# Patient Record
Sex: Female | Born: 1935 | Race: Black or African American | Hispanic: No | Smoking: Never smoker
Health system: Southern US, Community
[De-identification: ages and names within clinical notes are randomized; demographics above are authoritative.]

## PROBLEM LIST (undated history)

## (undated) DIAGNOSIS — E119 Type 2 diabetes mellitus without complications: Secondary | ICD-10-CM

## (undated) DIAGNOSIS — T8859XA Other complications of anesthesia, initial encounter: Secondary | ICD-10-CM

## (undated) DIAGNOSIS — N189 Chronic kidney disease, unspecified: Secondary | ICD-10-CM

## (undated) NOTE — *Deleted (*Deleted)
Pt bladder scanned x3 due to low urine output,  7mL each time.

---

## 2020-03-02 ENCOUNTER — Inpatient Hospital Stay (HOSPITAL_COMMUNITY)
Admission: EM | Admit: 2020-03-02 | Discharge: 2020-03-30 | DRG: 498 | Disposition: A | Discharge status: Home Health | Payer: 59 | Attending: Internal Medicine | Admitting: Internal Medicine

## 2020-03-02 ENCOUNTER — Encounter (HOSPITAL_COMMUNITY): Payer: Self-pay

## 2020-03-02 ENCOUNTER — Other Ambulatory Visit: Payer: Self-pay

## 2020-03-02 ENCOUNTER — Emergency Department (HOSPITAL_COMMUNITY): Payer: 59

## 2020-03-02 DIAGNOSIS — S72001S Fracture of unspecified part of neck of right femur, sequela: Secondary | ICD-10-CM | POA: Diagnosis not present

## 2020-03-02 DIAGNOSIS — R339 Retention of urine, unspecified: Secondary | ICD-10-CM | POA: Diagnosis present

## 2020-03-02 DIAGNOSIS — R52 Pain, unspecified: Secondary | ICD-10-CM | POA: Diagnosis present

## 2020-03-02 DIAGNOSIS — Z515 Encounter for palliative care: Secondary | ICD-10-CM

## 2020-03-02 DIAGNOSIS — R112 Nausea with vomiting, unspecified: Secondary | ICD-10-CM

## 2020-03-02 DIAGNOSIS — Z7189 Other specified counseling: Secondary | ICD-10-CM

## 2020-03-02 DIAGNOSIS — Z79899 Other long term (current) drug therapy: Secondary | ICD-10-CM

## 2020-03-02 DIAGNOSIS — W19XXXD Unspecified fall, subsequent encounter: Secondary | ICD-10-CM | POA: Diagnosis present

## 2020-03-02 DIAGNOSIS — Z794 Long term (current) use of insulin: Secondary | ICD-10-CM

## 2020-03-02 DIAGNOSIS — E860 Dehydration: Secondary | ICD-10-CM

## 2020-03-02 DIAGNOSIS — Z96643 Presence of artificial hip joint, bilateral: Secondary | ICD-10-CM | POA: Diagnosis present

## 2020-03-02 DIAGNOSIS — Z96649 Presence of unspecified artificial hip joint: Secondary | ICD-10-CM

## 2020-03-02 DIAGNOSIS — R05 Cough: Secondary | ICD-10-CM

## 2020-03-02 DIAGNOSIS — I9581 Postprocedural hypotension: Secondary | ICD-10-CM | POA: Diagnosis not present

## 2020-03-02 DIAGNOSIS — R600 Localized edema: Secondary | ICD-10-CM | POA: Diagnosis present

## 2020-03-02 DIAGNOSIS — K8020 Calculus of gallbladder without cholecystitis without obstruction: Secondary | ICD-10-CM | POA: Diagnosis present

## 2020-03-02 DIAGNOSIS — Z20822 Contact with and (suspected) exposure to covid-19: Secondary | ICD-10-CM | POA: Diagnosis present

## 2020-03-02 DIAGNOSIS — K5900 Constipation, unspecified: Secondary | ICD-10-CM | POA: Diagnosis present

## 2020-03-02 DIAGNOSIS — N1831 Chronic kidney disease, stage 3a: Secondary | ICD-10-CM | POA: Diagnosis not present

## 2020-03-02 DIAGNOSIS — G9341 Metabolic encephalopathy: Secondary | ICD-10-CM | POA: Diagnosis not present

## 2020-03-02 DIAGNOSIS — I129 Hypertensive chronic kidney disease with stage 1 through stage 4 chronic kidney disease, or unspecified chronic kidney disease: Secondary | ICD-10-CM | POA: Diagnosis present

## 2020-03-02 DIAGNOSIS — N183 Chronic kidney disease, stage 3 unspecified: Secondary | ICD-10-CM | POA: Diagnosis present

## 2020-03-02 DIAGNOSIS — E669 Obesity, unspecified: Secondary | ICD-10-CM | POA: Diagnosis present

## 2020-03-02 DIAGNOSIS — N1832 Chronic kidney disease, stage 3b: Secondary | ICD-10-CM | POA: Diagnosis present

## 2020-03-02 DIAGNOSIS — Z7401 Bed confinement status: Secondary | ICD-10-CM

## 2020-03-02 DIAGNOSIS — S72001A Fracture of unspecified part of neck of right femur, initial encounter for closed fracture: Secondary | ICD-10-CM | POA: Diagnosis present

## 2020-03-02 DIAGNOSIS — IMO0002 Reserved for concepts with insufficient information to code with codable children: Secondary | ICD-10-CM

## 2020-03-02 DIAGNOSIS — E1122 Type 2 diabetes mellitus with diabetic chronic kidney disease: Secondary | ICD-10-CM | POA: Diagnosis present

## 2020-03-02 DIAGNOSIS — R059 Cough, unspecified: Secondary | ICD-10-CM

## 2020-03-02 DIAGNOSIS — Z419 Encounter for procedure for purposes other than remedying health state, unspecified: Secondary | ICD-10-CM

## 2020-03-02 DIAGNOSIS — E875 Hyperkalemia: Secondary | ICD-10-CM | POA: Diagnosis not present

## 2020-03-02 DIAGNOSIS — L97829 Non-pressure chronic ulcer of other part of left lower leg with unspecified severity: Secondary | ICD-10-CM | POA: Diagnosis present

## 2020-03-02 DIAGNOSIS — L97819 Non-pressure chronic ulcer of other part of right lower leg with unspecified severity: Secondary | ICD-10-CM | POA: Diagnosis present

## 2020-03-02 DIAGNOSIS — R319 Hematuria, unspecified: Secondary | ICD-10-CM | POA: Diagnosis not present

## 2020-03-02 DIAGNOSIS — Z66 Do not resuscitate: Secondary | ICD-10-CM | POA: Diagnosis present

## 2020-03-02 DIAGNOSIS — W19XXXA Unspecified fall, initial encounter: Secondary | ICD-10-CM | POA: Diagnosis present

## 2020-03-02 DIAGNOSIS — E11649 Type 2 diabetes mellitus with hypoglycemia without coma: Secondary | ICD-10-CM | POA: Diagnosis not present

## 2020-03-02 DIAGNOSIS — R109 Unspecified abdominal pain: Secondary | ICD-10-CM

## 2020-03-02 DIAGNOSIS — S72091K Other fracture of head and neck of right femur, subsequent encounter for closed fracture with nonunion: Principal | ICD-10-CM

## 2020-03-02 DIAGNOSIS — N3582 Other urethral stricture, female: Secondary | ICD-10-CM | POA: Diagnosis present

## 2020-03-02 DIAGNOSIS — E46 Unspecified protein-calorie malnutrition: Secondary | ICD-10-CM | POA: Diagnosis present

## 2020-03-02 DIAGNOSIS — E43 Unspecified severe protein-calorie malnutrition: Secondary | ICD-10-CM | POA: Diagnosis present

## 2020-03-02 DIAGNOSIS — D62 Acute posthemorrhagic anemia: Secondary | ICD-10-CM | POA: Diagnosis not present

## 2020-03-02 DIAGNOSIS — E119 Type 2 diabetes mellitus without complications: Secondary | ICD-10-CM

## 2020-03-02 DIAGNOSIS — I080 Rheumatic disorders of both mitral and aortic valves: Secondary | ICD-10-CM | POA: Diagnosis present

## 2020-03-02 DIAGNOSIS — Z6832 Body mass index (BMI) 32.0-32.9, adult: Secondary | ICD-10-CM

## 2020-03-02 DIAGNOSIS — R197 Diarrhea, unspecified: Secondary | ICD-10-CM | POA: Diagnosis not present

## 2020-03-02 DIAGNOSIS — M80861A Other osteoporosis with current pathological fracture, right lower leg, initial encounter for fracture: Secondary | ICD-10-CM | POA: Diagnosis not present

## 2020-03-02 HISTORY — DX: Type 2 diabetes mellitus without complications: E11.9

## 2020-03-02 LAB — COMPREHENSIVE METABOLIC PANEL
ALT: 18 U/L (ref 0–44)
AST: 27 U/L (ref 15–41)
Albumin: 1.8 g/dL — ABNORMAL LOW (ref 3.5–5.0)
Alkaline Phosphatase: 321 U/L — ABNORMAL HIGH (ref 38–126)
Anion gap: 9 (ref 5–15)
BUN: 32 mg/dL — ABNORMAL HIGH (ref 8–23)
CO2: 17 mmol/L — ABNORMAL LOW (ref 22–32)
Calcium: 7.6 mg/dL — ABNORMAL LOW (ref 8.9–10.3)
Chloride: 111 mmol/L (ref 98–111)
Creatinine, Ser: 1.4 mg/dL — ABNORMAL HIGH (ref 0.44–1.00)
GFR calc Af Amer: 40 mL/min — ABNORMAL LOW (ref >60)
GFR calc non Af Amer: 35 mL/min — ABNORMAL LOW (ref >60)
Glucose, Bld: 176 mg/dL — ABNORMAL HIGH (ref 70–99)
Potassium: 4.3 mmol/L (ref 3.5–5.1)
Sodium: 137 mmol/L (ref 135–145)
Total Bilirubin: 0.8 mg/dL (ref 0.3–1.2)
Total Protein: 6 g/dL — ABNORMAL LOW (ref 6.5–8.1)

## 2020-03-02 LAB — CBC WITH DIFFERENTIAL/PLATELET
Abs Immature Granulocytes: 0.02 10*3/uL (ref 0.00–0.07)
Basophils Absolute: 0 10*3/uL (ref 0.0–0.1)
Basophils Relative: 0 %
Eosinophils Absolute: 0.1 10*3/uL (ref 0.0–0.5)
Eosinophils Relative: 1 %
HCT: 36.5 % (ref 36.0–46.0)
Hemoglobin: 11 g/dL — ABNORMAL LOW (ref 12.0–15.0)
Immature Granulocytes: 0 %
Lymphocytes Relative: 17 %
Lymphs Abs: 1 10*3/uL (ref 0.7–4.0)
MCH: 30.1 pg (ref 26.0–34.0)
MCHC: 30.1 g/dL (ref 30.0–36.0)
MCV: 99.7 fL (ref 80.0–100.0)
Monocytes Absolute: 0.9 10*3/uL (ref 0.1–1.0)
Monocytes Relative: 15 %
Neutro Abs: 4.1 10*3/uL (ref 1.7–7.7)
Neutrophils Relative %: 67 %
Platelets: 201 10*3/uL (ref 150–400)
RBC: 3.66 MIL/uL — ABNORMAL LOW (ref 3.87–5.11)
RDW: 15.3 % (ref 11.5–15.5)
WBC: 6.2 10*3/uL (ref 4.0–10.5)
nRBC: 0 % (ref 0.0–0.2)

## 2020-03-02 LAB — PROTIME-INR
INR: 1.2 (ref 0.8–1.2)
Prothrombin Time: 15.2 seconds (ref 11.4–15.2)

## 2020-03-02 MED ORDER — FENTANYL CITRATE (PF) 100 MCG/2ML IJ SOLN
50.0000 ug | INTRAMUSCULAR | Status: DC | PRN
  Administered 2020-03-02 (×2): 50 ug via INTRAVENOUS
  Filled 2020-03-02 (×2): qty 2

## 2020-03-02 MED ORDER — SODIUM CHLORIDE 0.9 % IV SOLN: INTRAVENOUS | Status: DC

## 2020-03-02 MED ORDER — ONDANSETRON HCL 4 MG/2ML IJ SOLN
4.0000 mg | Freq: Once | INTRAMUSCULAR | Status: AC
  Administered 2020-03-02: 4 mg via INTRAVENOUS
  Filled 2020-03-02: qty 2

## 2020-03-02 NOTE — ED Triage Notes (Signed)
Pt arrives POV with daughter who reports pt fell with right hip fracture 35 days ago while living in [place]. Daughter flew her to [place] and took her to a hospital where she stayed a few days and they made her leave due to insurance issues. Pt placed on stretcher in triage. Pt a.o.

## 2020-03-02 NOTE — Care Management (Signed)
ED CM consulted concerning patient who presented to the [facility] ED s/p hip injury in [place]. Patient has returned to [facility] and is a [place] resident with [employer] coverage. Daughter is concerned about when will surgery be done, due to increase pain and limited mobility.  CM explained that patient is still being evaluated by the EDP.  She verbalized understanding daughter remains at bedside on [place] [facility]. TOC will continue to follow for transitional care planning

## 2020-03-02 NOTE — ED Provider Notes (Signed)
[place] EMERGENCY DEPARTMENT Provider Note   CSN: [id number] Arrival date & time: 03/02/20  1810     History Chief Complaint  Patient presents with  . Hip Injury    Whitney [name] is a 85 y.o. female.  HPI She presents for evaluation of known right hip fracture.  She was seen each hip, at the time of the injury.  Her daughter gives history, and states that the patient was in bed, rolled over and felt a pop in her right hip.  She was hospitalized in the hospital in her hometown, in [place].  She left after a few days because of a bad experience with a prior hip fracture on the left.  She was brought to the [place], by air, arriving 1 week ago in [place].  She was immediately taken to a hospital, in [place], [place].  She was hospitalized for 2 days then discharged to follow-up with an orthopedist as an outpatient.  She was discharged, 02/27/2020.  Discharge medications, Tylenol, Lovenox, gabapentin, insulin glargine and lispro, oxycodone, Peri-Colace.  Note: Prior treatment at [facility], available in [place].    Past Medical History:  Diagnosis Date  . Diabetes mellitus without complication [facility])     Patient Active Problem List   Diagnosis Date Noted  . Intractable pain 03/02/2020    History reviewed. No pertinent surgical history.   OB History   No obstetric history on file.     No family history on file.  Social History   Tobacco Use  . Smoking status: Not on file  Substance Use Topics  . Alcohol use: Not on file  . Drug use: Not on file    Home Medications Prior to Admission medications   Not on File    Allergies    Patient has no allergy information on record.  Review of Systems   Review of Systems  All other systems reviewed and are negative.   Physical Exam Updated Vital Signs BP 131/65   Pulse 89   Temp 97.9 F (36.6 C) (Oral)   Resp 16   SpO2 97%   Physical Exam Vitals and nursing note  reviewed.  Constitutional:      Appearance: She is well-developed.  HENT:     Head: Normocephalic and atraumatic.  Eyes:     Conjunctiva/sclera: Conjunctivae normal.     Pupils: Pupils are equal, round, and reactive to light.  Neck:     Trachea: Phonation normal.  Cardiovascular:     Rate and Rhythm: Normal rate and regular rhythm.  Pulmonary:     Effort: Pulmonary effort is normal.     Breath sounds: Normal breath sounds.  Chest:     Chest wall: No tenderness.  Abdominal:     General: There is no distension.     Palpations: Abdomen is soft.     Tenderness: There is no abdominal tenderness. There is no guarding.  Musculoskeletal:        General: Normal range of motion.     Cervical back: Normal range of motion and neck supple.     Comments: Right leg shortened, right hip internally rotated, and tender  Skin:    General: Skin is warm and dry.     Comments: Scattered superficial pressure sores of the bilateral lower legs.  No associated bleeding, drainage or fluctuance.  Neurological:     Mental Status: She is alert and oriented to person, place, and time.     Motor:  No abnormal muscle tone.  Psychiatric:        Mood and Affect: Mood normal.        Behavior: Behavior normal.     ED Results / Procedures / Treatments   Labs (all labs ordered are listed, but only abnormal results are displayed) Labs Reviewed  COMPREHENSIVE METABOLIC PANEL - Abnormal; Notable for the following components:      Result Value   CO2 17 (*)    Glucose, Bld 176 (*)    BUN 32 (*)    Creatinine, Ser 1.40 (*)    Calcium 7.6 (*)    Total Protein 6.0 (*)    Albumin 1.8 (*)    Alkaline Phosphatase 321 (*)    GFR calc non Af Amer 35 (*)    GFR calc Af Amer 40 (*)    All other components within normal limits  CBC WITH DIFFERENTIAL/PLATELET - Abnormal; Notable for the following components:   RBC 3.66 (*)    Hemoglobin 11.0 (*)    All other components within normal limits  RESPIRATORY PANEL BY RT  PCR (FLU A&B, COVID)  PROTIME-INR  URINALYSIS, ROUTINE W REFLEX MICROSCOPIC    EKG EKG Interpretation  Date/Time:  Thursday March 02 2020 21:48:21 EDT Ventricular Rate:  79 PR Interval:    QRS Duration: 139 QT Interval:  445 QTC Calculation: 511 R Axis:   -44 Text Interpretation: Sinus rhythm 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 FINDINGS: There is a right femoral neck fracture with varus angulation. No subluxation or dislocation. Severe diffuse osteopenia. Prior left hip replacement. IMPRESSION: Right femoral neck fracture with varus angulation. Severe osteopenia. Electronically Signed   By: Rolm Baptise M.D.   On: 03/02/2020 19:45    Procedures Procedures (including critical care time)  Medications Ordered in ED Medications  fentaNYL (SUBLIMAZE) injection 50 mcg (50 mcg Intravenous Given 03/02/20 2139)  0.9 %  sodium chloride infusion ( Intravenous New Bag/Given 03/02/20 2142)  ondansetron (ZOFRAN) injection 4 mg (4 mg Intravenous Given 03/02/20 2139)    ED Course  I have reviewed the triage vital signs and the nursing notes.  Pertinent labs & imaging results that were available during my care of the patient were reviewed by me and considered in my medical decision making (see chart for details).  Clinical Course as of Mar 02 2322  Thu Mar 02, 2020  2117 Chronic appearing right hip fracture, interpreted by me  DG Hip Unilat  With Pelvis 2-3 Views Right [EW]  2118 I discussed case with Dr. Griffin Basil, who reviewed the patient's films.  He states that he does not have anyone who can flex the hip tomorrow, and suggest that the patient be discharged to follow-up with an orthopedist in his office, who does hip  replacements, next week.   [EW]  2221 Normal except CO2 low, glucose high, BUN high, creatinine high, calcium low, total protein low, albumin low, alk phos stays high, GFR low  Comprehensive metabolic panel(!) [EW]  5409 Normal except hemoglobin low  CBC with Differential(!) [EW]    Clinical Course User Index [EW] Daleen Bo, MD   MDM Rules/Calculators/A&P  Patient Vitals for the past 24 hrs:  BP Temp Temp src Pulse Resp SpO2  03/02/20 2130 131/65 -- -- 89 16 97 %  03/02/20 2100 (!) 135/59 -- -- 88 16 97 %  03/02/20 2050 (!) 148/66 -- -- 97 20 96 %  03/02/20 2024 -- -- -- 95 (!) 21 97 %  03/02/20 1818 (!) 134/47 97.9 F (36.6 C) Oral 94 16 94 %    11:23 PM Reevaluation with update and discussion. After initial assessment and treatment, an updated evaluation reveals she is more comfortable after pain medicine.  Findings discussed and questions answered. Daleen Bo   Medical Decision Making:  This patient is presenting for evaluation of subacute to chronic right hip fracture, not yet treated with additional interim problems including constipation, and not taking medications, which does require a range of treatment options, and is a complaint that involves a moderate risk of morbidity and mortality. The differential diagnoses include unstable fracture right hip, chronic right hip fracture, metabolic instability, constipation, occult infection,. I decided  to review old records, and in summary obtained through care everywhere, history as described by daughter above. I obtained additional historical information from daughter, at bedside. Clinical Laboratory Tests Ordered, included CBC, c-Met, urinalysis. Radiologic Tests Ordered, included right hip x-ray. I independently Visualized: Radiographic images, which show subacute to chronic right hip fracture; Cardiac Monitor Tracing which shows normal sinus rhythm   Critical Interventions-radiologic imaging,  laboratory evaluation, pain control, observation  After These Interventions, the Patient was reevaluated and was found more comfortable after pain medicine, laboratory indicators consistent with dehydration and hyperglycemia.  CRITICAL CARE-no Performed by: Daleen Bo   Nursing Notes Reviewed/ Care Coordinated Applicable Imaging Reviewed Interpretation of Laboratory Data incorporated into ED treatment   11:11 PM-Consult complete with hospitalist. Patient case explained and discussed.  He agrees to admit patient for further evaluation and treatment. Call ended at 11:21 PM  Plan: Admit   Final Clinical Impression(s) / ED Diagnoses Final diagnoses:  Closed fracture of right hip, initial encounter [facility])  Dehydration    Rx / DC Orders ED Discharge Orders    None       Daleen Bo, MD 03/02/20 2323

## 2020-03-02 NOTE — ED Notes (Signed)
UA sent down with culture

## 2020-03-03 ENCOUNTER — Observation Stay (HOSPITAL_COMMUNITY): Payer: 59

## 2020-03-03 ENCOUNTER — Inpatient Hospital Stay (HOSPITAL_COMMUNITY): Payer: 59

## 2020-03-03 ENCOUNTER — Encounter (HOSPITAL_COMMUNITY): Payer: Self-pay | Admitting: Family Medicine

## 2020-03-03 DIAGNOSIS — L97829 Non-pressure chronic ulcer of other part of left lower leg with unspecified severity: Secondary | ICD-10-CM | POA: Diagnosis present

## 2020-03-03 DIAGNOSIS — L97819 Non-pressure chronic ulcer of other part of right lower leg with unspecified severity: Secondary | ICD-10-CM | POA: Diagnosis present

## 2020-03-03 DIAGNOSIS — IMO0002 Reserved for concepts with insufficient information to code with codable children: Secondary | ICD-10-CM

## 2020-03-03 DIAGNOSIS — E43 Unspecified severe protein-calorie malnutrition: Secondary | ICD-10-CM | POA: Diagnosis present

## 2020-03-03 DIAGNOSIS — S72001A Fracture of unspecified part of neck of right femur, initial encounter for closed fracture: Secondary | ICD-10-CM | POA: Diagnosis not present

## 2020-03-03 DIAGNOSIS — S72001S Fracture of unspecified part of neck of right femur, sequela: Secondary | ICD-10-CM

## 2020-03-03 DIAGNOSIS — R52 Pain, unspecified: Secondary | ICD-10-CM | POA: Diagnosis not present

## 2020-03-03 DIAGNOSIS — E46 Unspecified protein-calorie malnutrition: Secondary | ICD-10-CM

## 2020-03-03 DIAGNOSIS — N1832 Chronic kidney disease, stage 3b: Secondary | ICD-10-CM | POA: Diagnosis present

## 2020-03-03 DIAGNOSIS — E11649 Type 2 diabetes mellitus with hypoglycemia without coma: Secondary | ICD-10-CM | POA: Diagnosis not present

## 2020-03-03 DIAGNOSIS — R197 Diarrhea, unspecified: Secondary | ICD-10-CM | POA: Diagnosis not present

## 2020-03-03 DIAGNOSIS — E119 Type 2 diabetes mellitus without complications: Secondary | ICD-10-CM | POA: Diagnosis not present

## 2020-03-03 DIAGNOSIS — Z515 Encounter for palliative care: Secondary | ICD-10-CM | POA: Diagnosis not present

## 2020-03-03 DIAGNOSIS — Z794 Long term (current) use of insulin: Secondary | ICD-10-CM | POA: Diagnosis not present

## 2020-03-03 DIAGNOSIS — R600 Localized edema: Secondary | ICD-10-CM | POA: Diagnosis present

## 2020-03-03 DIAGNOSIS — I129 Hypertensive chronic kidney disease with stage 1 through stage 4 chronic kidney disease, or unspecified chronic kidney disease: Secondary | ICD-10-CM | POA: Diagnosis present

## 2020-03-03 DIAGNOSIS — E860 Dehydration: Secondary | ICD-10-CM | POA: Diagnosis present

## 2020-03-03 DIAGNOSIS — S72091K Other fracture of head and neck of right femur, subsequent encounter for closed fracture with nonunion: Secondary | ICD-10-CM | POA: Diagnosis present

## 2020-03-03 DIAGNOSIS — W19XXXD Unspecified fall, subsequent encounter: Secondary | ICD-10-CM | POA: Diagnosis present

## 2020-03-03 DIAGNOSIS — Z7189 Other specified counseling: Secondary | ICD-10-CM | POA: Diagnosis not present

## 2020-03-03 DIAGNOSIS — I080 Rheumatic disorders of both mitral and aortic valves: Secondary | ICD-10-CM | POA: Diagnosis present

## 2020-03-03 DIAGNOSIS — E1122 Type 2 diabetes mellitus with diabetic chronic kidney disease: Secondary | ICD-10-CM | POA: Diagnosis present

## 2020-03-03 DIAGNOSIS — I340 Nonrheumatic mitral (valve) insufficiency: Secondary | ICD-10-CM | POA: Diagnosis not present

## 2020-03-03 DIAGNOSIS — N1831 Chronic kidney disease, stage 3a: Secondary | ICD-10-CM

## 2020-03-03 DIAGNOSIS — I9581 Postprocedural hypotension: Secondary | ICD-10-CM | POA: Diagnosis not present

## 2020-03-03 DIAGNOSIS — Z20822 Contact with and (suspected) exposure to covid-19: Secondary | ICD-10-CM | POA: Diagnosis present

## 2020-03-03 DIAGNOSIS — G9341 Metabolic encephalopathy: Secondary | ICD-10-CM | POA: Diagnosis not present

## 2020-03-03 DIAGNOSIS — N183 Chronic kidney disease, stage 3 unspecified: Secondary | ICD-10-CM | POA: Diagnosis present

## 2020-03-03 DIAGNOSIS — N3582 Other urethral stricture, female: Secondary | ICD-10-CM | POA: Diagnosis present

## 2020-03-03 DIAGNOSIS — E875 Hyperkalemia: Secondary | ICD-10-CM | POA: Diagnosis not present

## 2020-03-03 DIAGNOSIS — R319 Hematuria, unspecified: Secondary | ICD-10-CM | POA: Diagnosis not present

## 2020-03-03 DIAGNOSIS — W19XXXA Unspecified fall, initial encounter: Secondary | ICD-10-CM | POA: Diagnosis present

## 2020-03-03 DIAGNOSIS — Z66 Do not resuscitate: Secondary | ICD-10-CM | POA: Diagnosis present

## 2020-03-03 DIAGNOSIS — Z7401 Bed confinement status: Secondary | ICD-10-CM | POA: Diagnosis not present

## 2020-03-03 DIAGNOSIS — M80861A Other osteoporosis with current pathological fracture, right lower leg, initial encounter for fracture: Secondary | ICD-10-CM | POA: Diagnosis not present

## 2020-03-03 DIAGNOSIS — D62 Acute posthemorrhagic anemia: Secondary | ICD-10-CM | POA: Diagnosis not present

## 2020-03-03 LAB — URINALYSIS, ROUTINE W REFLEX MICROSCOPIC
Bilirubin Urine: NEGATIVE
Glucose, UA: NEGATIVE mg/dL
Hgb urine dipstick: NEGATIVE
Ketones, ur: NEGATIVE mg/dL
Nitrite: NEGATIVE
Protein, ur: NEGATIVE mg/dL
Specific Gravity, Urine: 1.02 (ref 1.005–1.030)
pH: 5 (ref 5.0–8.0)

## 2020-03-03 LAB — COMPREHENSIVE METABOLIC PANEL
ALT: 16 U/L (ref 0–44)
AST: 27 U/L (ref 15–41)
Albumin: 1.6 g/dL — ABNORMAL LOW (ref 3.5–5.0)
Alkaline Phosphatase: 282 U/L — ABNORMAL HIGH (ref 38–126)
Anion gap: 7 (ref 5–15)
BUN: 31 mg/dL — ABNORMAL HIGH (ref 8–23)
CO2: 19 mmol/L — ABNORMAL LOW (ref 22–32)
Calcium: 7.7 mg/dL — ABNORMAL LOW (ref 8.9–10.3)
Chloride: 117 mmol/L — ABNORMAL HIGH (ref 98–111)
Creatinine, Ser: 1.3 mg/dL — ABNORMAL HIGH (ref 0.44–1.00)
GFR calc Af Amer: 44 mL/min — ABNORMAL LOW (ref >60)
GFR calc non Af Amer: 38 mL/min — ABNORMAL LOW (ref >60)
Glucose, Bld: 147 mg/dL — ABNORMAL HIGH (ref 70–99)
Potassium: 4 mmol/L (ref 3.5–5.1)
Sodium: 143 mmol/L (ref 135–145)
Total Bilirubin: 0.4 mg/dL (ref 0.3–1.2)
Total Protein: 5.4 g/dL — ABNORMAL LOW (ref 6.5–8.1)

## 2020-03-03 LAB — GLUCOSE, CAPILLARY
Glucose-Capillary: 149 mg/dL — ABNORMAL HIGH (ref 70–99)
Glucose-Capillary: 99 mg/dL (ref 70–99)
Glucose-Capillary: 152 mg/dL — ABNORMAL HIGH (ref 70–99)
Glucose-Capillary: 109 mg/dL — ABNORMAL HIGH (ref 70–99)
Glucose-Capillary: 128 mg/dL — ABNORMAL HIGH (ref 70–99)

## 2020-03-03 LAB — CBC
HCT: 33.7 % — ABNORMAL LOW (ref 36.0–46.0)
Hemoglobin: 10.3 g/dL — ABNORMAL LOW (ref 12.0–15.0)
MCH: 30.2 pg (ref 26.0–34.0)
MCHC: 30.6 g/dL (ref 30.0–36.0)
MCV: 98.8 fL (ref 80.0–100.0)
Platelets: 183 10*3/uL (ref 150–400)
RBC: 3.41 MIL/uL — ABNORMAL LOW (ref 3.87–5.11)
RDW: 15.5 % (ref 11.5–15.5)
WBC: 5.9 10*3/uL (ref 4.0–10.5)
nRBC: 0 % (ref 0.0–0.2)

## 2020-03-03 LAB — RESPIRATORY PANEL BY RT PCR (FLU A&B, COVID)
Influenza A by PCR: NEGATIVE
Influenza B by PCR: NEGATIVE
SARS Coronavirus 2 by RT PCR: NEGATIVE

## 2020-03-03 LAB — ECHOCARDIOGRAM COMPLETE
Height: 62 in
Weight: 2811.31 oz

## 2020-03-03 LAB — BRAIN NATRIURETIC PEPTIDE: B Natriuretic Peptide: 74.9 pg/mL (ref 0.0–100.0)

## 2020-03-03 MED ORDER — INSULIN ASPART 100 UNIT/ML ~~LOC~~ SOLN
0.0000 [IU] | Freq: Every day | SUBCUTANEOUS | Status: DC
  Administered 2020-03-05: 3 [IU] via SUBCUTANEOUS

## 2020-03-03 MED ORDER — INSULIN ASPART 100 UNIT/ML ~~LOC~~ SOLN
0.0000 [IU] | Freq: Three times a day (TID) | SUBCUTANEOUS | Status: DC
  Administered 2020-03-03: 2 [IU] via SUBCUTANEOUS
  Administered 2020-03-04: 1 [IU] via SUBCUTANEOUS
  Administered 2020-03-04: 2 [IU] via SUBCUTANEOUS
  Administered 2020-03-05: 5 [IU] via SUBCUTANEOUS
  Administered 2020-03-05 – 2020-03-06 (×2): 3 [IU] via SUBCUTANEOUS
  Administered 2020-03-06: 2 [IU] via SUBCUTANEOUS
  Administered 2020-03-06: 1 [IU] via SUBCUTANEOUS
  Administered 2020-03-07: 2 [IU] via SUBCUTANEOUS
  Administered 2020-03-08 (×2): 1 [IU] via SUBCUTANEOUS
  Administered 2020-03-09 – 2020-03-10 (×3): 2 [IU] via SUBCUTANEOUS
  Administered 2020-03-11 – 2020-03-15 (×4): 1 [IU] via SUBCUTANEOUS
  Administered 2020-03-17 (×3): 2 [IU] via SUBCUTANEOUS
  Administered 2020-03-18: 1 [IU] via SUBCUTANEOUS
  Administered 2020-03-18: 2 [IU] via SUBCUTANEOUS
  Administered 2020-03-19 – 2020-03-20 (×3): 1 [IU] via SUBCUTANEOUS
  Administered 2020-03-20: 10:00:00 2 [IU] via SUBCUTANEOUS
  Administered 2020-03-21: 1 [IU] via SUBCUTANEOUS
  Administered 2020-03-21: 2 [IU] via SUBCUTANEOUS
  Administered 2020-03-23 – 2020-03-25 (×5): 1 [IU] via SUBCUTANEOUS
  Administered 2020-03-25 – 2020-03-27 (×2): 2 [IU] via SUBCUTANEOUS
  Administered 2020-03-28 – 2020-03-30 (×5): 1 [IU] via SUBCUTANEOUS

## 2020-03-03 MED ORDER — ENSURE ENLIVE PO LIQD
237.0000 mL | Freq: Two times a day (BID) | ORAL | Status: DC
  Administered 2020-03-03 – 2020-03-07 (×7): 237 mL via ORAL

## 2020-03-03 MED ORDER — GABAPENTIN 300 MG PO CAPS
300.0000 mg | ORAL_CAPSULE | Freq: Three times a day (TID) | ORAL | Status: DC
  Administered 2020-03-03 – 2020-03-09 (×17): 300 mg via ORAL
  Filled 2020-03-03 (×22): qty 1

## 2020-03-03 MED ORDER — SODIUM CHLORIDE 0.9% FLUSH: 3.0000 mL | INTRAVENOUS | Status: DC | PRN

## 2020-03-03 MED ORDER — INSULIN GLARGINE 100 UNIT/ML ~~LOC~~ SOLN
10.0000 [IU] | Freq: Every day | SUBCUTANEOUS | Status: DC
  Administered 2020-03-03 – 2020-03-13 (×11): 10 [IU] via SUBCUTANEOUS
  Filled 2020-03-03 (×13): qty 0.1

## 2020-03-03 MED ORDER — SODIUM CHLORIDE 0.9% FLUSH
3.0000 mL | Freq: Two times a day (BID) | INTRAVENOUS | Status: DC
  Administered 2020-03-03 – 2020-03-30 (×46): 3 mL via INTRAVENOUS

## 2020-03-03 MED ORDER — ONDANSETRON HCL 4 MG/2ML IJ SOLN
4.0000 mg | Freq: Four times a day (QID) | INTRAMUSCULAR | Status: DC | PRN
  Administered 2020-03-05: 4 mg via INTRAVENOUS
  Filled 2020-03-03: qty 2

## 2020-03-03 MED ORDER — HEPARIN SODIUM (PORCINE) 5000 UNIT/ML IJ SOLN
5000.0000 [IU] | Freq: Three times a day (TID) | INTRAMUSCULAR | Status: DC
  Administered 2020-03-03 – 2020-03-04 (×4): 5000 [IU] via SUBCUTANEOUS
  Filled 2020-03-03 (×4): qty 1

## 2020-03-03 MED ORDER — ADULT MULTIVITAMIN W/MINERALS CH
1.0000 | ORAL_TABLET | Freq: Every day | ORAL | Status: DC
  Administered 2020-03-03 – 2020-03-30 (×20): 1 via ORAL
  Filled 2020-03-03 (×30): qty 1

## 2020-03-03 MED ORDER — ACETAMINOPHEN 325 MG PO TABS
650.0000 mg | ORAL_TABLET | Freq: Four times a day (QID) | ORAL | Status: DC | PRN
  Administered 2020-03-04 – 2020-03-22 (×3): 650 mg via ORAL
  Filled 2020-03-03 (×9): qty 2

## 2020-03-03 MED ORDER — SODIUM CHLORIDE 0.9 % IV SOLN: 250.0000 mL | INTRAVENOUS | Status: DC | PRN

## 2020-03-03 MED ORDER — MORPHINE SULFATE (PF) 2 MG/ML IV SOLN
1.0000 mg | INTRAVENOUS | Status: DC | PRN
  Administered 2020-03-05 (×2): 2 mg via INTRAVENOUS
  Administered 2020-03-05: 1 mg via INTRAVENOUS
  Filled 2020-03-03 (×3): qty 1

## 2020-03-03 MED ORDER — SENNOSIDES-DOCUSATE SODIUM 8.6-50 MG PO TABS: 1.0000 | ORAL_TABLET | Freq: Every evening | ORAL | Status: DC | PRN

## 2020-03-03 NOTE — Progress Notes (Signed)
PROGRESS NOTE  Whitney [name] DGU:[id number] DOB: [birth year] DOA: 03/02/2020 PCP: Patient, No Pcp Per   LOS: 0 days   Brief narrative: As per HPI, Whitney [name] is a 85 y.o. female with medical history significant for insulin-dependent diabetes mellitus, chronic kidney disease stage III, and right hip fracture more than a month ago, now presenting to the emergency department with increasing right hip pain despite taking oxycodone and gabapentin at home.  Patient is accompanied by her daughter who assists with the history.  Patient previously lived in [place] but had since been in [place] where she suffered a right hip fracture more than a month ago.  She was seen in a hospital in [place] where she was treated with some pain medications and Lovenox for 4 flying to the [place].  She was briefly admitted to a hospital in [place] several days ago where she was seen by orthopedic surgery and discharged home with plans for outpatient follow-up.  She has since come to [place], continues to use oxycodone and gabapentin, but has been experiencing increasing pain in the right hip.  She denied any fevers, chills, chest pain, cough, or shortness of breath.  ED Course: Upon arrival to the ED, patient is found to be afebrile, saturating mid 90s on room air, and with stable blood pressure.  EKG features a sinus rhythm.  Radiographs of the right hip demonstrate right femoral neck fracture with varus angulation.  Chemistry panel notable for albumin 1.8 and creatinine 1.40, similar to priors.  Orthopedic surgery was consulted by the ED physician but did not feel that the patient requires inpatient surgical management.  She was given IV fentanyl in the ED but continues to complain of severe pain and hospitalists were asked to admit.   Assessment/Plan:  Principal Problem:   Intractable pain Active Problems:   Diabetes mellitus without complication (HCC)   CKD (chronic kidney disease), stage III  Closed right hip fracture (HCC)   Protein calorie malnutrition (HCC)   Bilateral leg edema   Right hip fracture with intractable pain  Patient suffered right hip fracture >1 month ago. Unable to walk since 2011 and was on wheelchair.  Now with increasing pain. She had been taking oxycodone and gabapentin at home.  Radiographs with right femoral neck fracture with varus angulation .Orthopedic surgery was consulted.   She might benefit from palliative hip surgery since she has been pretty much bedbound since 2011.Marland Kitchen  Continue IV analgesia, continue gabapentin, transition to oral medications as she tolerates .  Will hold off with PT evaluation at this time.   CKD IIIb - SCr today1.3, consistent with her apparent baseline. Renally-dose medications, monitor    Type II DM , insulin-dependent Hemoglobin A1c was 5.4% in April 2021. Continue CBGs, long- and short-acting insulin . On long acting and short acting insulin at home.  Bilateral lower Edema  Does not have history of congestive heart failure.  No dyspnea reported.  Likely secondary to stasis with recent hip fracture.  Patient is mostly bedbound on a wheelchair.  She does have blisters and superficial ulcers likely from pressure ulceration with bilateral pitting LE edema with bullae and serous weeping .  Very low albumin at 1.8.  Consult dietary.  Elevate legs.  Check BNP and 2D echocardiogram.   VTE Prophylaxis: Heparin subcu  Code Status: Full code  Family Communication: Spoke with the patient's daughter on the phone and updated her about the clinical condition of the patient.  Informed her that palliative  surgery might be necessary.  Status is: Observation  The patient will require care spanning > 2 midnights and should be moved to inpatient because: Ongoing active pain requiring inpatient pain management, Ongoing diagnostic testing needed not appropriate for outpatient work up, Unsafe d/c plan, Inpatient level of care appropriate due  to severity of illness and Need for orthopedic surgery evaluation and potential surgery.  Dispo: The patient is from: Home              Anticipated d/c is to: SNF              Anticipated d/c date is: 3 days              Patient currently is not medically stable to d/c.  Consultants: Orthopedics  Procedures:  None  Antibiotics:  . None  Anti-infectives (From admission, onward)   None      Subjective: Today, patient was seen and examined at bedside.  Speaks Arabic so limited history was obtained.  Complains of mild hip pain.  Objective: Vitals:   03/03/20 0742 03/03/20 0838  BP: 125/73 125/73  Pulse: 72 72  Resp: 16 16  Temp: (!) 97.5 F (36.4 C) (!) 97.5 F (36.4 C)  SpO2: 100%    No intake or output data in the 24 hours ending 03/03/20 1121 Filed Weights   03/03/20 0421 03/03/20 0838  Weight: 79.7 kg 79.7 kg   Body mass index is 32.14 kg/m.   Physical Exam:  GENERAL: Patient is alert awake and communicative not in obvious distress.  Obese HENT: No scleral pallor or icterus. Pupils equally reactive to light. Oral mucosa is moist NECK: is supple, no gross swelling noted. CHEST: Clear to auscultation. No crackles or wheezes.  Diminished breath sounds bilaterally. CVS: S1 and S2 heard, no murmur. Regular rate and rhythm.  ABDOMEN: Soft, non-tender, bowel sounds are present. EXTREMITIES: By lateral lower extremity pitting edema noted.  Tenderness to palpation of the right hip. CNS: Cranial nerves are intact. No focal motor deficits. SKIN: warm, bilateral lower extremity bullae, some bulla with rupture.  Dressings in place.  Data Review: I have personally reviewed the following laboratory data and studies,  CBC: Recent Labs  Lab 03/02/20 2117 03/03/20 0402  WBC 6.2 5.9  NEUTROABS 4.1  --   HGB 11.0* 10.3*  HCT 36.5 33.7*  MCV 99.7 98.8  PLT 201 295   Basic Metabolic Panel: Recent Labs  Lab 03/02/20 2117 03/03/20 0402  NA 137 143  K 4.3 4.0  CL  111 117*  CO2 17* 19*  GLUCOSE 176* 147*  BUN 32* 31*  CREATININE 1.40* 1.30*  CALCIUM 7.6* 7.7*   Liver Function Tests: Recent Labs  Lab 03/02/20 2117 03/03/20 0402  AST 27 27  ALT 18 16  ALKPHOS 321* 282*  BILITOT 0.8 0.4  PROT 6.0* 5.4*  ALBUMIN 1.8* 1.6*   No results for input(s): LIPASE, AMYLASE in the last 168 hours. No results for input(s): AMMONIA in the last 168 hours. Cardiac Enzymes: No results for input(s): CKTOTAL, CKMB, CKMBINDEX, TROPONINI in the last 168 hours. BNP (last 3 results) No results for input(s): BNP in the last 8760 hours.  ProBNP (last 3 results) No results for input(s): PROBNP in the last 8760 hours.  CBG: Recent Labs  Lab 03/03/20 0200 03/03/20 0821  GLUCAP 149* 99   Recent Results (from the past 240 hour(s))  Respiratory Panel by RT PCR (Flu A&B, Covid) - Urine, Catheterized  Status: None   Collection Time: 03/02/20 11:41 PM   Specimen: Urine, Catheterized  Result Value Ref Range Status   SARS Coronavirus 2 by RT PCR NEGATIVE NEGATIVE Final    Comment: (NOTE) SARS-CoV-2 target nucleic acids are NOT DETECTED. The SARS-CoV-2 RNA is generally detectable in upper respiratoy specimens during the acute phase of infection. The lowest concentration of SARS-CoV-2 viral copies this assay can detect is 131 copies/mL. A negative result does not preclude SARS-Cov-2 infection and should not be used as the sole basis for treatment or other patient management decisions. A negative result may occur with  improper specimen collection/handling, submission of specimen other than nasopharyngeal swab, presence of viral mutation(s) within the areas targeted by this assay, and inadequate number of viral copies (<131 copies/mL). A negative result must be combined with clinical observations, patient history, and epidemiological information. The expected result is Negative. Fact Sheet for Patients:  PinkCheek.be Fact Sheet  for Healthcare Providers:  GravelBags.it This test is not yet ap proved or cleared by the [place] FDA and  has been authorized for detection and/or diagnosis of SARS-CoV-2 by FDA under an Emergency Use Authorization (EUA). This EUA will remain  in effect (meaning this test can be used) for the duration of the COVID-19 declaration under Section 564(b)(1) of the Act, 21 U.S.C. section 360bbb-3(b)(1), unless the authorization is terminated or revoked sooner.    Influenza A by PCR NEGATIVE NEGATIVE Final   Influenza B by PCR NEGATIVE NEGATIVE Final    Comment: (NOTE) The Xpert Xpress SARS-CoV-2/FLU/RSV assay is intended as an aid in  the diagnosis of influenza from Nasopharyngeal swab specimens and  should not be used as a sole basis for treatment. Nasal washings and  aspirates are unacceptable for Xpert Xpress SARS-CoV-2/FLU/RSV  testing. Fact Sheet for Patients: PinkCheek.be Fact Sheet for Healthcare Providers: GravelBags.it This test is not yet approved or cleared by the [place] FDA and  has been authorized for detection and/or diagnosis of SARS-CoV-2 by  FDA under an Emergency Use Authorization (EUA). This EUA will remain  in effect (meaning this test can be used) for the duration of the  Covid-19 declaration under Section 564(b)(1) of the Act, 21  U.S.C. section 360bbb-3(b)(1), unless the authorization is  terminated or revoked. Performed at [facility], [place] [address], [place], [place] [address]      Studies: DG Hip Unilat  With Pelvis 2-3 Views Right  Result Date: 03/02/2020 CLINICAL DATA:  Right hip pain EXAM: DG HIP (WITH OR WITHOUT PELVIS) 2-3V RIGHT COMPARISON:  None. FINDINGS: There is a right femoral neck fracture with varus angulation. No subluxation or dislocation. Severe diffuse osteopenia. Prior left hip replacement. IMPRESSION: Right femoral neck fracture with  varus angulation. Severe osteopenia. Electronically Signed   By: Rolm Baptise M.D.   On: 03/02/2020 19:45      Flora Lipps, MD  [facility] 03/03/2020

## 2020-03-03 NOTE — Plan of Care (Signed)
  Problem: Education: Goal: Verbalization of understanding the information provided (i.e., activity precautions, restrictions, etc) will improve Outcome: Progressing   Problem: Activity: Goal: Ability to ambulate and perform ADLs will improve Outcome: Progressing   Problem: Pain Management: Goal: Pain level will decrease Outcome: Progressing

## 2020-03-03 NOTE — Consult Note (Signed)
Reason for Consult:Right hip fx Referring Physician: L Pokhrel  Whitney [name] is an 85 y.o. female.  HPI: Ashlon was in [place] about a month ago. She is non-ambulatory at baseline after a hip replacement on the left side 8 years ago. She denies trauma but says she was lying in bed when her right hip popped and she had terrible pain. She was diagnosed with a right hip fx and the plan was to try to treat non-operatively. She made her way back here and lives with her daughter. She continues to have severe pain and came to the ED for evaluation. X-rays confirmed the right femoral neck fx. She states she is amenable to surgery if that's recommended.  Past Medical History:  Diagnosis Date  . Diabetes mellitus without complication ([place])     History reviewed. No pertinent surgical history.  History reviewed. No pertinent family history.  Social History:  has no history on file for tobacco, alcohol, and drug.  Allergies: Not on File  Medications: I have reviewed the patient's current medications.  Results for orders placed or performed during the hospital encounter of 03/02/20 (from the past 48 hour(s))  Comprehensive metabolic panel     Status: Abnormal   Collection Time: 03/02/20  9:17 PM  Result Value Ref Range   Sodium 137 135 - 145 mmol/L   Potassium 4.3 3.5 - 5.1 mmol/L   Chloride 111 98 - 111 mmol/L   CO2 17 (L) 22 - 32 mmol/L   Glucose, Bld 176 (H) 70 - 99 mg/dL    Comment: Glucose reference range applies only to samples taken after fasting for at least 8 hours.   BUN 32 (H) 8 - 23 mg/dL   Creatinine, Ser 1.40 (H) 0.44 - 1.00 mg/dL   Calcium 7.6 (L) 8.9 - 10.3 mg/dL   Total Protein 6.0 (L) 6.5 - 8.1 g/dL   Albumin 1.8 (L) 3.5 - 5.0 g/dL   AST 27 15 - 41 U/L   ALT 18 0 - 44 U/L   Alkaline Phosphatase 321 (H) 38 - 126 U/L   Total Bilirubin 0.8 0.3 - 1.2 mg/dL   GFR calc non Af Amer 35 (L) >60 mL/min   GFR calc Af Amer 40 (L) >60 mL/min   Anion gap 9 5 - 15    Comment: Performed  at [facility] [address], [place], [place] [address]  CBC with Differential     Status: Abnormal   Collection Time: 03/02/20  9:17 PM  Result Value Ref Range   WBC 6.2 4.0 - 10.5 K/uL   RBC 3.66 (L) 3.87 - 5.11 MIL/uL   Hemoglobin 11.0 (L) 12.0 - 15.0 g/dL   HCT 36.5 36.0 - 46.0 %   MCV 99.7 80.0 - 100.0 fL   MCH 30.1 26.0 - 34.0 pg   MCHC 30.1 30.0 - 36.0 g/dL   RDW 15.3 11.5 - 15.5 %   Platelets 201 150 - 400 K/uL   nRBC 0.0 0.0 - 0.2 %   Neutrophils Relative % 67 %   Neutro Abs 4.1 1.7 - 7.7 K/uL   Lymphocytes Relative 17 %   Lymphs Abs 1.0 0.7 - 4.0 K/uL   Monocytes Relative 15 %   Monocytes Absolute 0.9 0.1 - 1.0 K/uL   Eosinophils Relative 1 %   Eosinophils Absolute 0.1 0.0 - 0.5 K/uL   Basophils Relative 0 %   Basophils Absolute 0.0 0.0 - 0.1 K/uL   Immature Granulocytes 0 %  Abs Immature Granulocytes 0.02 0.00 - 0.07 K/uL    Comment: Performed at [facility], [place] [address], [place], [place] [address]  Protime-INR     Status: None   Collection Time: 03/02/20  9:17 PM  Result Value Ref Range   Prothrombin Time 15.2 11.4 - 15.2 seconds   INR 1.2 0.8 - 1.2    Comment: (NOTE) INR goal varies based on device and disease states. Performed at [facility], [place] [address] [place], [place] [address]   Respiratory Panel by RT PCR (Flu A&B, Covid) - Urine, Catheterized     Status: None   Collection Time: 03/02/20 11:41 PM   Specimen: Urine, Catheterized  Result Value Ref Range   SARS Coronavirus 2 by RT PCR NEGATIVE NEGATIVE    Comment: (NOTE) SARS-CoV-2 target nucleic acids are NOT DETECTED. The SARS-CoV-2 RNA is generally detectable in upper respiratoy specimens during the acute phase of infection. The lowest concentration of SARS-CoV-2 viral copies this assay can detect is 131 copies/mL. A negative result does not preclude SARS-Cov-2 infection and should not be used as the sole basis for treatment or other patient management decisions. A  negative result may occur with  improper specimen collection/handling, submission of specimen other than nasopharyngeal swab, presence of viral mutation(s) within the areas targeted by this assay, and inadequate number of viral copies (<131 copies/mL). A negative result must be combined with clinical observations, patient history, and epidemiological information. The expected result is Negative. Fact Sheet for Patients:  PinkCheek.be Fact Sheet for Healthcare Providers:  GravelBags.it This test is not yet ap proved or cleared by the [place] FDA and  has been authorized for detection and/or diagnosis of SARS-CoV-2 by FDA under an Emergency Use Authorization (EUA). This EUA will remain  in effect (meaning this test can be used) for the duration of the COVID-19 declaration under Section 564(b)(1) of the Act, 21 U.S.C. section 360bbb-3(b)(1), unless the authorization is terminated or revoked sooner.    Influenza A by PCR NEGATIVE NEGATIVE   Influenza B by PCR NEGATIVE NEGATIVE    Comment: (NOTE) The Xpert Xpress SARS-CoV-2/FLU/RSV assay is intended as an aid in  the diagnosis of influenza from Nasopharyngeal swab specimens and  should not be used as a sole basis for treatment. Nasal washings and  aspirates are unacceptable for Xpert Xpress SARS-CoV-2/FLU/RSV  testing. Fact Sheet for Patients: PinkCheek.be Fact Sheet for Healthcare Providers: GravelBags.it This test is not yet approved or cleared by the [place] FDA and  has been authorized for detection and/or diagnosis of SARS-CoV-2 by  FDA under an Emergency Use Authorization (EUA). This EUA will remain  in effect (meaning this test can be used) for the duration of the  Covid-19 declaration under Section 564(b)(1) of the Act, 21  U.S.C. section 360bbb-3(b)(1), unless the authorization is  terminated or  revoked. Performed at [facility], [place] [address], [place], [place] [address]   Urinalysis, Routine w reflex microscopic     Status: Abnormal   Collection Time: 03/02/20 11:54 PM  Result Value Ref Range   Color, Urine YELLOW YELLOW   APPearance CLEAR CLEAR   Specific Gravity, Urine 1.020 1.005 - 1.030   pH 5.0 5.0 - 8.0   Glucose, UA NEGATIVE NEGATIVE mg/dL   Hgb urine dipstick NEGATIVE NEGATIVE   Bilirubin Urine NEGATIVE NEGATIVE   Ketones, ur NEGATIVE NEGATIVE mg/dL   Protein, ur NEGATIVE NEGATIVE mg/dL   Nitrite NEGATIVE NEGATIVE   Leukocytes,Ua  TRACE (A) NEGATIVE   RBC / HPF 0-5 0 - 5 RBC/hpf   WBC, UA 0-5 0 - 5 WBC/hpf   Bacteria, UA RARE (A) NONE SEEN   Squamous Epithelial / LPF 0-5 0 - 5    Comment: Performed at [facility], [place] [address] [place], [place] [address]  Glucose, capillary     Status: Abnormal   Collection Time: 03/03/20  2:00 AM  Result Value Ref Range   Glucose-Capillary 149 (H) 70 - 99 mg/dL    Comment: Glucose reference range applies only to samples taken after fasting for at least 8 hours.  Comprehensive metabolic panel     Status: Abnormal   Collection Time: 03/03/20  4:02 AM  Result Value Ref Range   Sodium 143 135 - 145 mmol/L   Potassium 4.0 3.5 - 5.1 mmol/L   Chloride 117 (H) 98 - 111 mmol/L   CO2 19 (L) 22 - 32 mmol/L   Glucose, Bld 147 (H) 70 - 99 mg/dL    Comment: Glucose reference range applies only to samples taken after fasting for at least 8 hours.   BUN 31 (H) 8 - 23 mg/dL   Creatinine, Ser 1.30 (H) 0.44 - 1.00 mg/dL   Calcium 7.7 (L) 8.9 - 10.3 mg/dL   Total Protein 5.4 (L) 6.5 - 8.1 g/dL   Albumin 1.6 (L) 3.5 - 5.0 g/dL   AST 27 15 - 41 U/L   ALT 16 0 - 44 U/L   Alkaline Phosphatase 282 (H) 38 - 126 U/L   Total Bilirubin 0.4 0.3 - 1.2 mg/dL   GFR calc non Af Amer 38 (L) >60 mL/min   GFR calc Af Amer 44 (L) >60 mL/min   Anion gap 7 5 - 15    Comment: Performed at [facility] [address],  [place], [place] [address]  CBC     Status: Abnormal   Collection Time: 03/03/20  4:02 AM  Result Value Ref Range   WBC 5.9 4.0 - 10.5 K/uL   RBC 3.41 (L) 3.87 - 5.11 MIL/uL   Hemoglobin 10.3 (L) 12.0 - 15.0 g/dL   HCT 33.7 (L) 36.0 - 46.0 %   MCV 98.8 80.0 - 100.0 fL   MCH 30.2 26.0 - 34.0 pg   MCHC 30.6 30.0 - 36.0 g/dL   RDW 15.5 11.5 - 15.5 %   Platelets 183 150 - 400 K/uL   nRBC 0.0 0.0 - 0.2 %    Comment: Performed at [facility], [place] [address] [place], [place] [address]  Glucose, capillary     Status: None   Collection Time: 03/03/20  8:21 AM  Result Value Ref Range   Glucose-Capillary 99 70 - 99 mg/dL    Comment: Glucose reference range applies only to samples taken after fasting for at least 8 hours.    DG Hip Unilat  With Pelvis 2-3 Views Right  Result Date: 03/02/2020 CLINICAL DATA:  Right hip pain EXAM: DG HIP (WITH OR WITHOUT PELVIS) 2-3V RIGHT COMPARISON:  None. FINDINGS: There is a right femoral neck fracture with varus angulation. No subluxation or dislocation. Severe diffuse osteopenia. Prior left hip replacement. IMPRESSION: Right femoral neck fracture with varus angulation. Severe osteopenia. Electronically Signed   By: Rolm Baptise M.D.   On: 03/02/2020 19:45    Review of Systems  HENT: Negative for ear discharge, ear pain, hearing loss and tinnitus.   Eyes: Negative for photophobia and pain.  Respiratory:  Negative for cough and shortness of breath.   Cardiovascular: Negative for chest pain.  Gastrointestinal: Negative for abdominal pain, nausea and vomiting.  Genitourinary: Negative for dysuria, flank pain, frequency and urgency.  Musculoskeletal: Positive for arthralgias (Right hip). Negative for back pain, myalgias and neck pain.  Neurological: Negative for dizziness and headaches.  Hematological: Does not bruise/bleed easily.  Psychiatric/Behavioral: The patient is not nervous/anxious.    Blood pressure 125/73, pulse 72, temperature (!) 97.5 F (36.4  C), temperature source Oral, resp. rate 16, height 5\' 2"  (1.575 m), weight 79.7 kg, SpO2 100 %. Physical Exam  Constitutional: She appears well-developed and well-nourished. No distress.  HENT:  Head: Normocephalic and atraumatic.  Eyes: Conjunctivae are normal. Right eye exhibits no discharge. Left eye exhibits no discharge. No scleral icterus.  Cardiovascular: Normal rate and regular rhythm.  Respiratory: Effort normal. No respiratory distress.  Musculoskeletal:     Cervical back: Normal range of motion.     Comments: RLE No traumatic wounds, ecchymosis, or rash  Severe TTP hip, bullae lower leg (bilaterally)  No knee or ankle effusion  Knee stable to varus/ valgus and anterior/posterior stress  Sens DPN, SPN, TN intact  Motor EHL, ext, flex, evers 5/5  DP 2+, PT 1+, 2+ edema   Neurological: She is alert.  Skin: Skin is warm and dry. She is not diaphoretic.  Psychiatric: She has a normal mood and affect. Her behavior is normal.    Assessment/Plan: Right hip fx -- Will get CT scan. Dr. 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 Multiple medical problems including insulin-dependent diabetes mellitus, chronic kidney disease stage III, and LE edema -- per primary service    Lisette Abu, PA-C Orthopedic Surgery [phone] 03/03/2020, 10:07 AM

## 2020-03-03 NOTE — H&P (Signed)
History and Physical    [name] NTI:[id number] DOB: 12-22-1934 DOA: 03/02/2020  PCP: Patient, No Pcp Per   Patient coming from: Home   Chief Complaint: Right hip pain   HPI: Whitney [name] is a 85 y.o. female with medical history significant for insulin-dependent diabetes mellitus, chronic kidney disease stage III, and right hip fracture more than a month ago, now presenting to the emergency department with increasing right hip pain despite taking oxycodone and gabapentin at home.  Patient is accompanied by her daughter who assists with the history.  Patient previously lived in [place] but had since been in [place] where she suffered a right hip fracture more than a month ago.  She was seen in a hospital in [place] where she was treated with some pain medications and Lovenox for 4 flying to the [place].  She was briefly admitted to a hospital in [place] several days ago where she was seen by orthopedic surgery and discharged home with plans for outpatient follow-up.  She has since come to [place], continues to use oxycodone and gabapentin, but has been experiencing increasing pain in the right hip.  She denied any fevers, chills, chest pain, cough, or shortness of breath.  ED Course: Upon arrival to the ED, patient is found to be afebrile, saturating mid 90s on room air, and with stable blood pressure.  EKG features a sinus rhythm.  Radiographs of the right hip demonstrate right femoral neck fracture with varus angulation.  Chemistry panel notable for albumin 1.8 and creatinine 1.40, similar to priors.  Orthopedic surgery was consulted by the ED physician but did not feel that the patient requires inpatient surgical management.  She was given IV fentanyl in the ED but continues to complain of severe pain and hospitalists were asked to admit.  Review of Systems:  All other systems reviewed and apart from HPI, are negative.  Past Medical History:  Diagnosis Date  . Diabetes  mellitus without complication ([place])     History reviewed. No pertinent surgical history.   has no history on file for tobacco, alcohol, and drug.  Not on File  History reviewed. No pertinent family history.   Prior to Admission medications   Medication Sig Start Date End Date Taking? Authorizing Provider  acetaminophen (TYLENOL) 500 MG tablet Take 2 tablets by mouth every 8 (eight) hours as needed for moderate pain. 02/28/20  Yes [provider]  enoxaparin (LOVENOX) 40 MG/0.4ML injection Inject 40 mg into the skin daily. For 20 days. 02/28/20 03/19/20 Yes [provider]  gabapentin (NEURONTIN) 100 MG capsule Take 3 capsules by mouth 3 (three) times daily. 02/28/20  Yes [provider]  insulin glargine (LANTUS) 100 UNIT/ML Solostar Pen Inject 15 Units into the skin at bedtime. 02/27/20  Yes [provider]  insulin lispro (HUMALOG) 100 UNIT/ML injection Inject 3 Units into the skin 3 (three) times daily before meals. 02/27/20  Yes [provider]  oxyCODONE (OXY IR/ROXICODONE) 5 MG immediate release tablet Take 1-2 tablets by mouth every 4 (four) hours as needed for moderate pain or severe pain. 02/28/20 03/06/20 Yes [provider]    Physical Exam: Vitals:   03/02/20 2215 03/02/20 2315 03/03/20 0030 03/03/20 0141  BP: (!) 129/53 (!) 129/52 137/77 129/62  Pulse: 76 76 81 81  Resp: (!) 22 18 17 18   Temp:    97.6 F (36.4 C)  TempSrc:    Oral  SpO2: 100% 100% 100% 100%    Constitutional:  NAD, calm  Eyes: PERTLA, lids and conjunctivae normal ENMT: Mucous membranes are moist. Posterior pharynx clear of any exudate or lesions.   Neck: normal, supple, no masses, no thyromegaly Respiratory:  no wheezing, no crackles. No accessory muscle use.  Cardiovascular: S1 & S2 heard, regular rate and rhythm. Pitting edema involving b/l LEs with bullae. Abdomen: No distension, no tenderness, soft. Bowel sounds active.  Musculoskeletal: no clubbing  / cyanosis. Right hip deformity and tenderness; neurovascularly intact.   Skin: Bullae and serous drainage involving lower legs bilaterally. Warm, dry, well-perfused. Neurologic: CN 2-12 grossly intact. Sensation intact. Moving all extremities.  Psychiatric: Alert and oriented to person, place, and situation. Pleasant and cooperative.    Labs and Imaging on Admission: I have personally reviewed following labs and imaging studies  CBC: Recent Labs  Lab 03/02/20 2117  WBC 6.2  NEUTROABS 4.1  HGB 11.0*  HCT 36.5  MCV 99.7  PLT 563   Basic Metabolic Panel: Recent Labs  Lab 03/02/20 2117  NA 137  K 4.3  CL 111  CO2 17*  GLUCOSE 176*  BUN 32*  CREATININE 1.40*  CALCIUM 7.6*   GFR: CrCl cannot be calculated (Unknown ideal weight.). Liver Function Tests: Recent Labs  Lab 03/02/20 2117  AST 27  ALT 18  ALKPHOS 321*  BILITOT 0.8  PROT 6.0*  ALBUMIN 1.8*   No results for input(s): LIPASE, AMYLASE in the last 168 hours. No results for input(s): AMMONIA in the last 168 hours. Coagulation Profile: Recent Labs  Lab 03/02/20 2117  INR 1.2   Cardiac Enzymes: No results for input(s): CKTOTAL, CKMB, CKMBINDEX, TROPONINI in the last 168 hours. BNP (last 3 results) No results for input(s): PROBNP in the last 8760 hours. HbA1C: No results for input(s): HGBA1C in the last 72 hours. CBG: No results for input(s): GLUCAP in the last 168 hours. Lipid Profile: No results for input(s): CHOL, HDL, LDLCALC, TRIG, CHOLHDL, LDLDIRECT in the last 72 hours. Thyroid Function Tests: No results for input(s): TSH, T4TOTAL, FREET4, T3FREE, THYROIDAB in the last 72 hours. Anemia Panel: No results for input(s): VITAMINB12, FOLATE, FERRITIN, TIBC, IRON, RETICCTPCT in the last 72 hours. Urine analysis:    Component Value Date/Time   COLORURINE YELLOW 03/02/2020 2354   APPEARANCEUR CLEAR 03/02/2020 2354   LABSPEC 1.020 03/02/2020 2354   PHURINE 5.0 03/02/2020 2354   GLUCOSEU NEGATIVE  03/02/2020 2354   HGBUR NEGATIVE 03/02/2020 2354   BILIRUBINUR NEGATIVE 03/02/2020 2354   KETONESUR NEGATIVE 03/02/2020 2354   PROTEINUR NEGATIVE 03/02/2020 2354   NITRITE NEGATIVE 03/02/2020 2354   LEUKOCYTESUR TRACE (A) 03/02/2020 2354   Sepsis Labs: @LABRCNTIP (procalcitonin:4,lacticidven:4) ) Recent Results (from the past 240 hour(s))  Respiratory Panel by RT PCR (Flu A&B, Covid) - Urine, Catheterized     Status: None   Collection Time: 03/02/20 11:41 PM   Specimen: Urine, Catheterized  Result Value Ref Range Status   SARS Coronavirus 2 by RT PCR NEGATIVE NEGATIVE Final    Comment: (NOTE) SARS-CoV-2 target nucleic acids are NOT DETECTED. The SARS-CoV-2 RNA is generally detectable in upper respiratoy specimens during the acute phase of infection. The lowest concentration of SARS-CoV-2 viral copies this assay can detect is 131 copies/mL. A negative result does not preclude SARS-Cov-2 infection and should not be used as the sole basis for treatment or other patient management decisions. A negative result may occur with  improper specimen collection/handling, submission of specimen other than nasopharyngeal swab, presence of viral mutation(s) within the areas targeted by this  assay, and inadequate number of viral copies (<131 copies/mL). A negative result must be combined with clinical observations, patient history, and epidemiological information. The expected result is Negative. Fact Sheet for Patients:  PinkCheek.be Fact Sheet for Healthcare Providers:  GravelBags.it This test is not yet ap proved or cleared by the [place] FDA and  has been authorized for detection and/or diagnosis of SARS-CoV-2 by FDA under an Emergency Use Authorization (EUA). This EUA will remain  in effect (meaning this test can be used) for the duration of the COVID-19 declaration under Section 564(b)(1) of the Act, 21 U.S.C. section  360bbb-3(b)(1), unless the authorization is terminated or revoked sooner.    Influenza A by PCR NEGATIVE NEGATIVE Final   Influenza B by PCR NEGATIVE NEGATIVE Final    Comment: (NOTE) The Xpert Xpress SARS-CoV-2/FLU/RSV assay is intended as an aid in  the diagnosis of influenza from Nasopharyngeal swab specimens and  should not be used as a sole basis for treatment. Nasal washings and  aspirates are unacceptable for Xpert Xpress SARS-CoV-2/FLU/RSV  testing. Fact Sheet for Patients: PinkCheek.be Fact Sheet for Healthcare Providers: GravelBags.it This test is not yet approved or cleared by the [place] FDA and  has been authorized for detection and/or diagnosis of SARS-CoV-2 by  FDA under an Emergency Use Authorization (EUA). This EUA will remain  in effect (meaning this test can be used) for the duration of the  Covid-19 declaration under Section 564(b)(1) of the Act, 21  U.S.C. section 360bbb-3(b)(1), unless the authorization is  terminated or revoked. Performed at [facility], [place] [address], [place], [place] [address]      Radiological Exams on Admission: DG Hip Unilat  With Pelvis 2-3 Views Right  Result Date: 03/02/2020 CLINICAL DATA:  Right hip pain EXAM: DG HIP (WITH OR WITHOUT PELVIS) 2-3V RIGHT COMPARISON:  None. FINDINGS: There is a right femoral neck fracture with varus angulation. No subluxation or dislocation. Severe diffuse osteopenia. Prior left hip replacement. IMPRESSION: Right femoral neck fracture with varus angulation. Severe osteopenia. Electronically Signed   By: Rolm Baptise M.D.   On: 03/02/2020 19:45    EKG: Independently reviewed. Sinus rhythm, RBBB, LAFB.   Assessment/Plan   1. Right hip fracture with intractable pain  - Patient suffered right hip fracture >1 month ago and now presents with increasing pain despite home medications  - She has been taking oxycodone and gabapentin at  home, was treated in ED with IV fentanyl, but continues to have severe pain  - Radiographs with right femoral neck fracture with varus angulation  - Orthopedic surgery consulted by ED physician but did not feel that she needs inpatient management from their perspective  - Continue IV analgesia, continue gabapentin, transition to oral medications as she tolerates    2. CKD IIIb - SCr is 1.40 in ED, consistent with her apparent baseline  - Renally-dose medications, monitor    3. Insulin-dependent DM  - A1c was 5.4% in April 2021  - Continue CBGs, long- and short-acting insulin    4. Edema  - Patient has bilateral pitting LE edema with bullae and serous weeping  - No documented hx of CHF  - Albumin is only 1.8  - Elevate legs, consult dietary     DVT prophylaxis: sq heparin  Code Status: Full  Family Communication: Daughter updated at bedside  Disposition Plan:  Patient is from: Home  Anticipated d/c is to: Suspect she may require SNF  Anticipated d/c date is: 03/04/20  Patient currently: requiring IV analgesia  Consults called: ED physician discussed with orthopedic surgery  Admission status: Observation     Vianne Bulls, MD [facility] Pager: See [email or web address]  If 7AM-7PM, please contact the daytime attending [email or web address]  03/03/2020, 1:57 AM

## 2020-03-03 NOTE — Consult Note (Signed)
[facility] Nurse Consult Note: Patient receiving care in [place].  Primary RN present at the time of my assessment. Reason for Consult: BLE blisters and sacral wound Wound type: intact and non-intact bullae present to BLE.  Dressings in place to these.  Sacral area has scattered areas of hypopigmentation.  On the upper left buttocks there is a very small partial thickness area of impairment approximately 0.8 cm x 0.3cm that is highly likely related to friction. Pressure Injury POA: Yes/No/NA Measurement: as above Wound bed: 100% pink Drainage (amount, consistency, odor) none Periwound: intact with scattered hypopigmentation Dressing procedure/placement/frequency: 3 step skin cleansing program order entered.  Air mattress and use ONLY ONE DermaTherapy pad beneath patient's buttocks. Do NOT use disposable pads orders entered. And, Wash BLE with soap and water. Pat dry. Place Mepitel dressings Kellie Simmering # [phone]) over each intact or ruptured blister area. Top with ABD pads. Secure with kerlex. Change ABD pads and kerlex daily. The Mepitel can remain in place up to 5 days. Monitor the wound area(s) for worsening of condition such as: Signs/symptoms of infection,  Increase in size,  Development of or worsening of odor, Development of pain, or increased pain at the affected locations.  Notify the medical team if any of these develop.  Thank you for the consult.  Discussed plan of care with the bedside nurse.  [facility] nurse will not follow at this time.  Please re-consult the [facility] team if needed.  Val Riles, RN, MSN, CWOCN, CNS-BC, pager [phone]

## 2020-03-03 NOTE — Progress Notes (Signed)
Initial Nutrition Assessment  DOCUMENTATION CODES:   Obesity unspecified  INTERVENTION:   -Ensure Enlive po BID, each supplement provides 350 kcal and 20 grams of protein -MVI with minerals daily  NUTRITION DIAGNOSIS:   Increased nutrient needs related to wound healing as evidenced by estimated needs.  GOAL:   Patient will meet greater than or equal to 90% of their needs  MONITOR:   PO intake, Supplement acceptance, Labs, Weight trends, Skin, I & O's  REASON FOR ASSESSMENT:   Consult Assessment of nutrition requirement/status  ASSESSMENT:   Whitney [name] is a 85 y.o. female with medical history significant for insulin-dependent diabetes mellitus, chronic kidney disease stage III, and right hip fracture more than a month ago, now presenting to the emergency department with increasing right hip pain despite taking oxycodone and gabapentin at home. Patient previously lived in [place] but had since been in [place] where she suffered a right hip fracture more than a month ago.  She was seen in a hospital in [place] where she was treated with some pain medications and Lovenox for 4 flying to the [place].  She was briefly admitted to a hospital in [place] several days ago where she was seen by orthopedic surgery and discharged home with plans for outpatient follow-up.  She has since come to [place], continues to use oxycodone and gabapentin, but has been experiencing increasing pain in the right hip.  She denied any fevers, chills, chest pain, cough, or shortness of breath.  Pt admitted with rt hip fracture with intractable pain.   Case discussed with RN, who reports [employer] Interpreter is being used to communicate with pt. Per RN, pt was wheelchair bound PTA. She had no difficulty swallowing pills or water. She consumed about 50% of her breakfast.   Spoke with pt at bedside with assistance of [employer] Interpreter (Amal #[id number]). Pt reports good appetite PTA consuming 3  meals per day and choosing healthier foods. Pt consumed fruit juice, milk, and banana today. Difficult to obtain further history from pt, as device got disconnected.   Unsure of wt hx PAT; noted moderate edema to lower extremities.   Albumin has a half-life of 21 days and is strongly affected by stress response and inflammatory process, therefore, do not expect to see an improvement in this lab value during acute hospitalization. When a patient presents with low albumin, it is likely skewed due to the acute inflammatory response.  Unless it is suspected that patient had poor PO intake or malnutrition prior to admission, then RD should not be consulted solely for low albumin. Note that low albumin is no longer used to diagnose malnutrition; [facility] uses the new malnutrition guidelines published by the American Society for Parenteral and Enteral Nutrition (A.S.P.E.N.) and the Academy of Nutrition and Dietetics (AND).    No results found for: HGBA1C PTA DM medications are 15 units insulin glargine q HS and 3 units insulin lispro TID before meals.   Labs reviewed: CBGS: 149 (inpatient orders for glycemic control are 0-5 units insulin aspart q HS, 0-9 units insulin aspart TID with meals, and 10 units insulin glargine q HS).   NUTRITION - FOCUSED PHYSICAL EXAM:    Most Recent Value  Orbital Region  No depletion  Upper Arm Region  No depletion  Thoracic and Lumbar Region  No depletion  Buccal Region  No depletion  Temple Region  No depletion  Clavicle Bone Region  No depletion  Clavicle and Acromion Bone Region  No  depletion  Scapular Bone Region  No depletion  Dorsal Hand  No depletion  Patellar Region  No depletion  Anterior Thigh Region  No depletion  Posterior Calf Region  No depletion  Edema (RD Assessment)  Moderate  Hair  Reviewed  Eyes  Reviewed  Mouth  Reviewed  Skin  Reviewed  Nails  Reviewed       Diet Order:   Diet Order            Diet regular Room service appropriate?  Yes; Fluid consistency: Thin  Diet effective now              EDUCATION NEEDS:   No education needs have been identified at this time  Skin:  Skin Assessment: Skin Integrity Issues: Skin Integrity Issues:: Other (Comment) Other: bullae present on BLE; partial thickness area to lt buttocks; hyperpignemtation to sacrum  Last BM:  Unknown  Height:   Ht Readings from Last 1 Encounters:  03/03/20 5\' 2"  (1.575 m)    Weight:   Wt Readings from Last 1 Encounters:  03/03/20 79.7 kg    Ideal Body Weight:  50 kg  BMI:  Body mass index is 32.14 kg/m.  Estimated Nutritional Needs:   Kcal:  1500-1700  Protein:  80-95 grams  Fluid:  > 1.5 L    Loistine Chance, RD, LDN, [place] Registered Dietitian II Certified Diabetes Care and Education Specialist Please refer to [facility] for RD and/or RD on-call/weekend/after hours pager

## 2020-03-03 NOTE — Progress Notes (Signed)
  Echocardiogram 2D Echocardiogram has been performed.  Jennette Dubin 03/03/2020, 3:12 PM

## 2020-03-03 NOTE — Plan of Care (Signed)
  Problem: Education: Goal: Verbalization of understanding the information provided (i.e., activity precautions, restrictions, etc) will improve Outcome: Progressing Goal: Individualized Educational Video(s) Outcome: Progressing   Problem: Pain Management: Goal: Pain level will decrease Outcome: Progressing

## 2020-03-04 LAB — CBC
HCT: 31.9 % — ABNORMAL LOW (ref 36.0–46.0)
Hemoglobin: 9.6 g/dL — ABNORMAL LOW (ref 12.0–15.0)
MCH: 30.1 pg (ref 26.0–34.0)
MCHC: 30.1 g/dL (ref 30.0–36.0)
MCV: 100 fL (ref 80.0–100.0)
Platelets: 162 10*3/uL (ref 150–400)
RBC: 3.19 MIL/uL — ABNORMAL LOW (ref 3.87–5.11)
RDW: 15.6 % — ABNORMAL HIGH (ref 11.5–15.5)
WBC: 4.6 10*3/uL (ref 4.0–10.5)
nRBC: 0 % (ref 0.0–0.2)

## 2020-03-04 LAB — COMPREHENSIVE METABOLIC PANEL
ALT: 17 U/L (ref 0–44)
AST: 35 U/L (ref 15–41)
Albumin: 1.4 g/dL — ABNORMAL LOW (ref 3.5–5.0)
Alkaline Phosphatase: 260 U/L — ABNORMAL HIGH (ref 38–126)
Anion gap: 4 — ABNORMAL LOW (ref 5–15)
BUN: 29 mg/dL — ABNORMAL HIGH (ref 8–23)
CO2: 22 mmol/L (ref 22–32)
Calcium: 7.6 mg/dL — ABNORMAL LOW (ref 8.9–10.3)
Chloride: 117 mmol/L — ABNORMAL HIGH (ref 98–111)
Creatinine, Ser: 1.36 mg/dL — ABNORMAL HIGH (ref 0.44–1.00)
GFR calc Af Amer: 42 mL/min — ABNORMAL LOW (ref >60)
GFR calc non Af Amer: 36 mL/min — ABNORMAL LOW (ref >60)
Glucose, Bld: 121 mg/dL — ABNORMAL HIGH (ref 70–99)
Potassium: 4.3 mmol/L (ref 3.5–5.1)
Sodium: 143 mmol/L (ref 135–145)
Total Bilirubin: 0.2 mg/dL — ABNORMAL LOW (ref 0.3–1.2)
Total Protein: 5 g/dL — ABNORMAL LOW (ref 6.5–8.1)

## 2020-03-04 LAB — SURGICAL PCR SCREEN
MRSA, PCR: NEGATIVE
Staphylococcus aureus: NEGATIVE

## 2020-03-04 LAB — GLUCOSE, CAPILLARY
Glucose-Capillary: 102 mg/dL — ABNORMAL HIGH (ref 70–99)
Glucose-Capillary: 165 mg/dL — ABNORMAL HIGH (ref 70–99)
Glucose-Capillary: 133 mg/dL — ABNORMAL HIGH (ref 70–99)
Glucose-Capillary: 145 mg/dL — ABNORMAL HIGH (ref 70–99)

## 2020-03-04 LAB — MAGNESIUM: Magnesium: 1.9 mg/dL (ref 1.7–2.4)

## 2020-03-04 MED ORDER — ENSURE PRE-SURGERY PO LIQD
296.0000 mL | Freq: Once | ORAL | Status: AC
  Administered 2020-03-05: 296 mL via ORAL
  Filled 2020-03-04: qty 296

## 2020-03-04 MED ORDER — DOCUSATE SODIUM 100 MG PO CAPS
100.0000 mg | ORAL_CAPSULE | Freq: Two times a day (BID) | ORAL | Status: DC
  Administered 2020-03-04 (×2): 100 mg via ORAL
  Filled 2020-03-04 (×3): qty 1

## 2020-03-04 MED ORDER — CEFAZOLIN SODIUM-DEXTROSE 2-4 GM/100ML-% IV SOLN
2.0000 g | INTRAVENOUS | Status: DC
  Filled 2020-03-04: qty 100

## 2020-03-04 MED ORDER — TRANEXAMIC ACID-NACL 1000-0.7 MG/100ML-% IV SOLN: 1000.0000 mg | INTRAVENOUS | Status: DC

## 2020-03-04 MED ORDER — POVIDONE-IODINE 10 % EX SWAB: 2.0000 "application " | Freq: Once | CUTANEOUS | Status: DC

## 2020-03-04 MED ORDER — TRANEXAMIC ACID 1000 MG/10ML IV SOLN
2000.0000 mg | INTRAVENOUS | Status: DC
  Filled 2020-03-04 (×2): qty 20

## 2020-03-04 MED ORDER — POLYETHYLENE GLYCOL 3350 17 G PO PACK
17.0000 g | PACK | Freq: Every day | ORAL | Status: DC
  Administered 2020-03-05 – 2020-03-07 (×3): 17 g via ORAL
  Filled 2020-03-04 (×5): qty 1

## 2020-03-04 MED ORDER — MAGNESIUM CITRATE PO SOLN
0.5000 | Freq: Once | ORAL | Status: AC
  Administered 2020-03-04: 0.5 via ORAL
  Filled 2020-03-04: qty 296

## 2020-03-04 NOTE — Progress Notes (Addendum)
PROGRESS NOTE  Whitney [name] HYW:[id number] DOB: 10-11-1935 DOA: 03/02/2020 PCP: Patient, No Pcp Per   LOS: 1 day   Brief narrative: As per HPI,  Whitney [name] is a 85 y.o. female with medical history significant for insulin-dependent diabetes mellitus, chronic kidney disease stage III, and right hip fracture more than a month ago, now presenting to the emergency department with increasing right hip pain despite taking oxycodone and gabapentin at home.  Patient is accompanied by her daughter who assists with the history.  Patient previously lived in [place] but had since been in [place] where she suffered a right hip fracture more than a month ago.  She was seen in a hospital in [place] where she was treated with some pain medications and Lovenox for 4 flying to the [place].  She was briefly admitted to a hospital in [place] several days ago where she was seen by orthopedic surgery and discharged home with plans for outpatient follow-up.  She has since come to [place], continues to use oxycodone and gabapentin, but has been experiencing increasing pain in the right hip.  She denied any fevers, chills, chest pain, cough, or shortness of breath.  ED Course: Upon arrival to the ED, patient is found to be afebrile, saturating mid 90s on room air, and with stable blood pressure.  EKG features a sinus rhythm.  Radiographs of the right hip demonstrate right femoral neck fracture with varus angulation.  Chemistry panel notable for albumin 1.8 and creatinine 1.40, similar to priors.  Orthopedic surgery was consulted by the ED physician but did not feel that the patient requires inpatient surgical management.  She was given IV fentanyl in the ED but continues to complain of severe pain and hospitalists were asked to admit.   Assessment/Plan:  Principal Problem:   Intractable pain Active Problems:   Diabetes mellitus without complication (HCC)   CKD (chronic kidney disease), stage III  Closed right hip fracture (HCC)   Protein calorie malnutrition (HCC)   Bilateral leg edema   Closed right hip fracture, initial encounter ([place])   Right hip fracture with intractable pain  Patient suffered right hip fracture >1 month ago. Unable to walk since 2011 and was on wheelchair.  Now with increasing pain. She had been taking oxycodone and gabapentin at home.  Radiographs with right femoral neck fracture with varus angulation .Orthopedic surgery on board.  She might benefit from palliative hip surgery since she has been pretty much bedbound since 2011.  Continue analgesia, gabapentin, and IV narcotics as necessary.   CKD IIIb Continue to monitor with BMP.  Creatinine of 1.3 at baseline.   Type II DM , insulin-dependent Hemoglobin A1c was 5.4% in April 2021. Continue CBGs, long- and short-acting insulin . On long acting and short acting insulin at home.  Continue with that.  Last POC glucose was 102.  Bilateral lower Edema  With blisters and superficial ulcers Patient has bilateral pitting LE edema with bullae and serous weeping. Likely secondary to stasis and hypoalbuminemia from malnutrition.  Does not have history of congestive heart failure.  No dyspnea reported.  Patient is mostly bedbound on a wheelchair.  Marland Kitchen  BNP within normal limits.  2D echocardiogram with preserved LV function.    Protein calorie malnutrition, obesity unspecified.  Present on admission.  Nutritional services on board.  We will continue nutritional supplements. On ensure  Constipation.  We will put the patient on MiraLAX stool softeners and mag citrate for today.   VTE  Prophylaxis: Heparin subcu  Code Status: Full code  Family Communication: None today.  Spoke with the patient's daughter on the phone yesterday.  Status is: Inpatient  The patient will require inpatient because: Ongoing active pain requiring inpatient pain management, Unsafe d/c plan, Inpatient level of care appropriate due to severity of  illness and Need for potential surgery.  Dispo: The patient is from: Home              Anticipated d/c is to: SNF              Anticipated d/c date is: 3 days              Patient currently is not medically stable to d/c.  Consultants: Orthopedics  Procedures:  None  Antibiotics:  . None  Anti-infectives (From admission, onward)   None     Subjective: Today, patient was seen and examined at bedside. Speaks arabic. Spoke with interpretator service. Complains of abdominal distention, constipation for several days.  Patient states that she eats okay at home.  Complains of hip pain.  Feels uncomfortable in the bed and wishes to be moved.  Objective: Vitals:   03/03/20 2029 03/04/20 0510  BP: (!) 161/71 (!) 164/71  Pulse: 76 74  Resp: 16 17  Temp: (!) 97.4 F (36.3 C) (!) 97.4 F (36.3 C)  SpO2: 99% 98%    Intake/Output Summary (Last 24 hours) at 03/04/2020 1136 Last data filed at 03/04/2020 0900 Gross per 24 hour  Intake 240 ml  Output --  Net 240 ml   Filed Weights   03/03/20 0421 03/03/20 0838  Weight: 79.7 kg 79.7 kg   Body mass index is 32.14 kg/m.   Physical Exam: GENERAL: Patient is alert awake and oriented, communicative not in obvious distress.  Obese HENT: No scleral pallor or icterus. Pupils equally reactive to light. Oral mucosa is moist NECK: is supple, no gross swelling noted. CHEST: Clear to auscultation. No crackles or wheezes.  Diminished breath sounds bilaterally. CVS: S1 and S2 heard, no murmur. Regular rate and rhythm.  ABDOMEN: Soft, non-tender, bowel sounds are present. EXTREMITIES: Bilateral lower extremity pitting edema noted.  Tenderness to palpation of the right hip. CNS: Cranial nerves are intact. No focal motor deficits. SKIN: warm, bilateral lower extremity bullae, some bulla with rupture.  Dressings in place.  Data Review: I have personally reviewed the following laboratory data and studies,  CBC: Recent Labs  Lab 03/02/20 2117  03/03/20 0402 03/04/20 0504  WBC 6.2 5.9 4.6  NEUTROABS 4.1  --   --   HGB 11.0* 10.3* 9.6*  HCT 36.5 33.7* 31.9*  MCV 99.7 98.8 100.0  PLT 201 183 956   Basic Metabolic Panel: Recent Labs  Lab 03/02/20 2117 03/03/20 0402 03/04/20 0504  NA 137 143 143  K 4.3 4.0 4.3  CL 111 117* 117*  CO2 17* 19* 22  GLUCOSE 176* 147* 121*  BUN 32* 31* 29*  CREATININE 1.40* 1.30* 1.36*  CALCIUM 7.6* 7.7* 7.6*  MG  --   --  1.9   Liver Function Tests: Recent Labs  Lab 03/02/20 2117 03/03/20 0402 03/04/20 0504  AST 27 27 35  ALT 18 16 17   ALKPHOS 321* 282* 260*  BILITOT 0.8 0.4 0.2*  PROT 6.0* 5.4* 5.0*  ALBUMIN 1.8* 1.6* 1.4*   No results for input(s): LIPASE, AMYLASE in the last 168 hours. No results for input(s): AMMONIA in the last 168 hours. Cardiac Enzymes: No results for input(s):  CKTOTAL, CKMB, CKMBINDEX, TROPONINI in the last 168 hours. BNP (last 3 results) Recent Labs    03/03/20 0402  BNP 74.9    ProBNP (last 3 results) No results for input(s): PROBNP in the last 8760 hours.  CBG: Recent Labs  Lab 03/03/20 0821 03/03/20 1155 03/03/20 1633 03/03/20 2136 03/04/20 0729  GLUCAP 99 152* 109* 128* 102*   Recent Results (from the past 240 hour(s))  Respiratory Panel by RT PCR (Flu A&B, Covid) - Urine, Catheterized     Status: None   Collection Time: 03/02/20 11:41 PM   Specimen: Urine, Catheterized  Result Value Ref Range Status   SARS Coronavirus 2 by RT PCR NEGATIVE NEGATIVE Final    Comment: (NOTE) SARS-CoV-2 target nucleic acids are NOT DETECTED. The SARS-CoV-2 RNA is generally detectable in upper respiratoy specimens during the acute phase of infection. The lowest concentration of SARS-CoV-2 viral copies this assay can detect is 131 copies/mL. A negative result does not preclude SARS-Cov-2 infection and should not be used as the sole basis for treatment or other patient management decisions. A negative result may occur with  improper specimen  collection/handling, submission of specimen other than nasopharyngeal swab, presence of viral mutation(s) within the areas targeted by this assay, and inadequate number of viral copies (<131 copies/mL). A negative result must be combined with clinical observations, patient history, and epidemiological information. The expected result is Negative. Fact Sheet for Patients:  PinkCheek.be Fact Sheet for Healthcare Providers:  GravelBags.it This test is not yet ap proved or cleared by the [place] FDA and  has been authorized for detection and/or diagnosis of SARS-CoV-2 by FDA under an Emergency Use Authorization (EUA). This EUA will remain  in effect (meaning this test can be used) for the duration of the COVID-19 declaration under Section 564(b)(1) of the Act, 21 U.S.C. section 360bbb-3(b)(1), unless the authorization is terminated or revoked sooner.    Influenza A by PCR NEGATIVE NEGATIVE Final   Influenza B by PCR NEGATIVE NEGATIVE Final    Comment: (NOTE) The Xpert Xpress SARS-CoV-2/FLU/RSV assay is intended as an aid in  the diagnosis of influenza from Nasopharyngeal swab specimens and  should not be used as a sole basis for treatment. Nasal washings and  aspirates are unacceptable for Xpert Xpress SARS-CoV-2/FLU/RSV  testing. Fact Sheet for Patients: PinkCheek.be Fact Sheet for Healthcare Providers: GravelBags.it This test is not yet approved or cleared by the [place] FDA and  has been authorized for detection and/or diagnosis of SARS-CoV-2 by  FDA under an Emergency Use Authorization (EUA). This EUA will remain  in effect (meaning this test can be used) for the duration of the  Covid-19 declaration under Section 564(b)(1) of the Act, 21  U.S.C. section 360bbb-3(b)(1), unless the authorization is  terminated or revoked. Performed at [facility], [place] [address] [place], [place] [address]      Studies: CT HIP RIGHT WO CONTRAST  Result Date: 03/03/2020 CLINICAL DATA:  Hip fracture 1 month prior EXAM: CT OF THE RIGHT HIP WITHOUT CONTRAST TECHNIQUE: Multidetector CT imaging of the right hip was performed according to the standard protocol. Multiplanar CT image reconstructions were also generated. COMPARISON:  Radiograph 03/02/2020 FINDINGS: Bones/Joint/Cartilage Further demonstration of the impacted transcervical femoral neck fracture with marked varus angulation seen on comparison radiographs. There is a subacute appearance to the fracture margins with marked osteopenia and sclerotic changes without features of callus formation or bony bridging to suggest even and early healing response. There is  minimal distension the joint capsule with some soft tissue thickening and adjacent stranding suggesting at least mild synovitis. The femoral head remains normally located within the acetabulum. Moderate underlying hip arthrosis is present. There is soft tissue thickening and chronic appearing bony destructive changes at the symphysis pubis. Further thickening and erosive changes noted at the right SI joint. Age indeterminate bilateral sacral insufficiency fractures are noted as well. Exuberant enthesophytes are noted upon the greater trochanter and lesser trochanter as well as the ischial tuberosity. Ligaments Suboptimally assessed by CT. Muscles and Tendons There is diffuse atrophy of the proximal thigh musculature. No clear intramuscular hemorrhage or abscess is seen. No retracted torn tendons are evident. Soft tissues Circumferential soft tissue swelling of the right hip, may reflect a combination of edematous change and inflammation. Synovial changes of the right hip joint as above. There is focal soft tissue thickening posterior to the left ilium, with some irregular attenuation and soft tissue mineralization. Possibly posttraumatic or surgical in nature  such as sequela of fat necrosis, though overall indeterminate. Included portions of the abdomen and pelvis demonstrate a large rectal stool ball with some mild perirectal fat stranding, which could reflect a mild stercoral colitis. Dense vascular calcifications noted in the uterus. IMPRESSION: 1. Impacted transcervical femoral neck fracture with marked varus angulation. There is a subacute appearance to the fracture margins with marked osteopenia and sclerotic changes. Mild right hip effusion and synovitis noted as well. 2. Moderate underlying hip arthrosis. 3. Soft tissue thickening and chronic appearing bony destructive changes at the symphysis pubis and right SI joint. 4. Age indeterminate bilateral sacral insufficiency fractures. 5. Large rectal stool ball with some mild perirectal fat stranding, which could reflect a mild stercoral colitis. 6. Focal soft tissue thickening posterior to the left ilium, with some irregular attenuation and soft tissue mineralization. Possibly posttraumatic or postsurgical changes sequela of fat necrosis. 7. Diffuse atrophy of the proximal thigh musculature. Electronically Signed   By: Lovena Le M.D.   On: 03/03/2020 18:08   ECHOCARDIOGRAM COMPLETE  Result Date: 03/03/2020    ECHOCARDIOGRAM REPORT   Patient Name:   Whitney [name] Date of Exam: 03/03/2020 Medical Rec #:  [id number]    Height:       62.0 in Accession #:    [id number]   Weight:       175.7 lb Date of Birth:  1935-10-15    BSA:          1.809 m Patient Age:    48 years     BP:           125/73 mmHg Patient Gender: F            HR:           72 bpm. Exam Location:  Inpatient Procedure: 2D Echo Indications:    Congestive Heart Failure  History:        Patient has no prior history of Echocardiogram examinations.                 Risk Factors:Diabetes.  Sonographer:    Mikki Santee RDCS (AE) Referring Phys: [id number] [place]  1. Abnormal septal motion . Left ventricular ejection fraction, by  estimation, is 55 to 60%. The left ventricle has normal function. The left ventricle has no regional wall motion abnormalities. Left ventricular diastolic parameters were normal.  2. Right ventricular systolic function is normal. The right ventricular size is normal.  3. The mitral valve is degenerative. Mild to  moderate mitral valve regurgitation. No evidence of mitral stenosis.  4. The aortic valve is tricuspid. Aortic valve regurgitation is not visualized. Mild to moderate aortic valve sclerosis/calcification is present, without any evidence of aortic stenosis.  5. The inferior vena cava is normal in size with greater than 50% respiratory variability, suggesting right atrial pressure of 3 mmHg. FINDINGS  Left Ventricle: Abnormal septal motion. Left ventricular ejection fraction, by estimation, is 55 to 60%. The left ventricle has normal function. The left ventricle has no regional wall motion abnormalities. The left ventricular internal cavity size was normal in size. There is no left ventricular hypertrophy. Left ventricular diastolic parameters were normal. Right Ventricle: The right ventricular size is normal. No increase in right ventricular wall thickness. Right ventricular systolic function is normal. Left Atrium: Left atrial size was normal in size. Right Atrium: Right atrial size was normal in size. Pericardium: There is no evidence of pericardial effusion. Mitral Valve: The mitral valve is degenerative in appearance. There is moderate thickening of the mitral valve leaflet(s). There is moderate calcification of the mitral valve leaflet(s). Normal mobility of the mitral valve leaflets. Severe mitral annular  calcification. Mild to moderate mitral valve regurgitation. No evidence of mitral valve stenosis. Tricuspid Valve: The tricuspid valve is normal in structure. Tricuspid valve regurgitation is not demonstrated. No evidence of tricuspid stenosis. Aortic Valve: The aortic valve is tricuspid. Aortic valve  regurgitation is not visualized. Mild to moderate aortic valve sclerosis/calcification is present, without any evidence of aortic stenosis. Pulmonic Valve: The pulmonic valve was normal in structure. Pulmonic valve regurgitation is not visualized. No evidence of pulmonic stenosis. Aorta: The aortic root is normal in size and structure. Venous: The inferior vena cava is normal in size with greater than 50% respiratory variability, suggesting right atrial pressure of 3 mmHg. IAS/Shunts: The interatrial septum was not well visualized.  LEFT VENTRICLE PLAX 2D LVIDd:         3.80 cm  Diastology LVIDs:         3.20 cm  LV e' lateral:   7.27 cm/s LV PW:         1.10 cm  LV E/e' lateral: 14.3 LV IVS:        0.90 cm  LV e' medial:    3.94 cm/s LVOT diam:     2.00 cm  LV E/e' medial:  26.4 LV SV:         69 LV SV Index:   38 LVOT Area:     3.14 cm  RIGHT VENTRICLE RV S prime:     7.71 cm/s TAPSE (M-mode): 1.7 cm LEFT ATRIUM             Index       RIGHT ATRIUM           Index LA diam:        3.00 cm 1.66 cm/m  RA Area:     10.80 cm LA Vol (A2C):   40.0 ml 22.11 ml/m RA Volume:   18.60 ml  10.28 ml/m LA Vol (A4C):   36.6 ml 20.23 ml/m LA Biplane Vol: 39.3 ml 21.72 ml/m  AORTIC VALVE LVOT Vmax:   80.80 cm/s LVOT Vmean:  58.400 cm/s LVOT VTI:    0.221 m  AORTA Ao Root diam: 3.40 cm MITRAL VALVE MV Area (PHT): 3.77 cm     SHUNTS MV Decel Time: 201 msec     Systemic VTI:  0.22 m MV E velocity: 104.00 cm/s  Systemic Diam:  2.00 cm MV A velocity: 81.50 cm/s MV E/A ratio:  1.28 Jenkins Rouge MD Electronically signed by Jenkins Rouge MD Signature Date/Time: 03/03/2020/4:34:25 PM    Final    DG Hip Unilat  With Pelvis 2-3 Views Right  Result Date: 03/02/2020 CLINICAL DATA:  Right hip pain EXAM: DG HIP (WITH OR WITHOUT PELVIS) 2-3V RIGHT COMPARISON:  None. FINDINGS: There is a right femoral neck fracture with varus angulation. No subluxation or dislocation. Severe diffuse osteopenia. Prior left hip replacement. IMPRESSION: Right  femoral neck fracture with varus angulation. Severe osteopenia. Electronically Signed   By: Rolm Baptise M.D.   On: 03/02/2020 19:45      Flora Lipps, MD  [facility] 03/04/2020

## 2020-03-04 NOTE — Progress Notes (Signed)
I went by patient's room today and no family was around.  I will attempt to get in touch with the daughter regarding her wishes for the patient.

## 2020-03-04 NOTE — Progress Notes (Signed)
I have spoken with the daughter and they have decided to proceed with girdlestone procedure tomorrow morning.  I have made her NPO after midnight.  Will hold lovenox.

## 2020-03-04 NOTE — Anesthesia Preprocedure Evaluation (Addendum)
Anesthesia Evaluation  Patient identified by MRN, date of birth, ID band Patient awake    Reviewed: Allergy & Precautions, NPO status , Patient's Chart, lab work & pertinent test results  History of Anesthesia Complications Negative for: history of anesthetic complications  Airway Mallampati: II  TM Distance: >3 FB Neck ROM: Full    Dental  (+) Dental Advisory Given, Poor Dentition, Missing   Pulmonary neg pulmonary ROS,    Pulmonary exam normal        Cardiovascular Normal cardiovascular exam+ Valvular Problems/Murmurs MR    '21 TTE - EF 55 to 60%. Mild-moderate MR. Mild to moderate aortic valve sclerosis/calcification without stenosis.     Neuro/Psych Seizures - (postoperatively after 2 previous surgeries years ago, never since), Well Controlled,  negative psych ROS   GI/Hepatic negative GI ROS, Neg liver ROS,   Endo/Other  diabetes, Type 2, Insulin Dependent Obesity   Renal/GU CRFRenal disease     Musculoskeletal negative musculoskeletal ROS (+)   Abdominal   Peds  Hematology  (+) anemia ,   Anesthesia Other Findings Covid neg 4/22   Reproductive/Obstetrics                            Anesthesia Physical Anesthesia Plan  ASA: III  Anesthesia Plan: General   Post-op Pain Management:    Induction: Intravenous  PONV Risk Score and Plan: 4 or greater and Treatment may vary due to age or medical condition, Ondansetron and Dexamethasone  Airway Management Planned: LMA  Additional Equipment: None  Intra-op Plan:   Post-operative Plan: Extubation in OR  Informed Consent: I have reviewed the patients History and Physical, chart, labs and discussed the procedure including the risks, benefits and alternatives for the proposed anesthesia with the patient or authorized representative who has indicated his/her understanding and acceptance.     Dental advisory given  Plan Discussed  with: CRNA and Anesthesiologist  Anesthesia Plan Comments:        Anesthesia Quick Evaluation

## 2020-03-05 ENCOUNTER — Inpatient Hospital Stay (HOSPITAL_COMMUNITY): Payer: 59

## 2020-03-05 ENCOUNTER — Encounter (HOSPITAL_COMMUNITY)
Admission: EM | Disposition: A | Discharge status: Home Health | Payer: Self-pay | Source: Home / Self Care | Attending: Internal Medicine

## 2020-03-05 ENCOUNTER — Inpatient Hospital Stay (HOSPITAL_COMMUNITY): Payer: 59 | Admitting: Anesthesiology

## 2020-03-05 DIAGNOSIS — R52 Pain, unspecified: Secondary | ICD-10-CM

## 2020-03-05 HISTORY — PX: TOTAL HIP ARTHROPLASTY: SHX124

## 2020-03-05 LAB — CBC
HCT: 28.1 % — ABNORMAL LOW (ref 36.0–46.0)
Hemoglobin: 8.6 g/dL — ABNORMAL LOW (ref 12.0–15.0)
MCH: 30.3 pg (ref 26.0–34.0)
MCHC: 30.6 g/dL (ref 30.0–36.0)
MCV: 98.9 fL (ref 80.0–100.0)
Platelets: 139 10*3/uL — ABNORMAL LOW (ref 150–400)
RBC: 2.84 MIL/uL — ABNORMAL LOW (ref 3.87–5.11)
RDW: 15.4 % (ref 11.5–15.5)
WBC: 10.9 10*3/uL — ABNORMAL HIGH (ref 4.0–10.5)
nRBC: 0 % (ref 0.0–0.2)

## 2020-03-05 LAB — GLUCOSE, CAPILLARY
Glucose-Capillary: 177 mg/dL — ABNORMAL HIGH (ref 70–99)
Glucose-Capillary: 214 mg/dL — ABNORMAL HIGH (ref 70–99)
Glucose-Capillary: 241 mg/dL — ABNORMAL HIGH (ref 70–99)
Glucose-Capillary: 299 mg/dL — ABNORMAL HIGH (ref 70–99)
Glucose-Capillary: 287 mg/dL — ABNORMAL HIGH (ref 70–99)

## 2020-03-05 LAB — BASIC METABOLIC PANEL
Anion gap: 4 — ABNORMAL LOW (ref 5–15)
BUN: 31 mg/dL — ABNORMAL HIGH (ref 8–23)
CO2: 23 mmol/L (ref 22–32)
Calcium: 7.6 mg/dL — ABNORMAL LOW (ref 8.9–10.3)
Chloride: 113 mmol/L — ABNORMAL HIGH (ref 98–111)
Creatinine, Ser: 1.35 mg/dL — ABNORMAL HIGH (ref 0.44–1.00)
GFR calc Af Amer: 42 mL/min — ABNORMAL LOW (ref >60)
GFR calc non Af Amer: 36 mL/min — ABNORMAL LOW (ref >60)
Glucose, Bld: 313 mg/dL — ABNORMAL HIGH (ref 70–99)
Potassium: 4.8 mmol/L (ref 3.5–5.1)
Sodium: 140 mmol/L (ref 135–145)

## 2020-03-05 LAB — MAGNESIUM: Magnesium: 2.2 mg/dL (ref 1.7–2.4)

## 2020-03-05 SURGERY — ARTHROPLASTY, HIP, TOTAL, ANTERIOR APPROACH
Anesthesia: General | Site: Hip | Laterality: Right

## 2020-03-05 MED ORDER — SUCCINYLCHOLINE CHLORIDE 200 MG/10ML IV SOSY
PREFILLED_SYRINGE | INTRAVENOUS | Status: AC
  Filled 2020-03-05: qty 10

## 2020-03-05 MED ORDER — MENTHOL 3 MG MT LOZG: 1.0000 | LOZENGE | OROMUCOSAL | Status: DC | PRN

## 2020-03-05 MED ORDER — PHENYLEPHRINE HCL-NACL 10-0.9 MG/250ML-% IV SOLN
INTRAVENOUS | Status: DC | PRN
  Administered 2020-03-05: 25 ug/min via INTRAVENOUS

## 2020-03-05 MED ORDER — POLYETHYLENE GLYCOL 3350 17 G PO PACK: 17.0000 g | PACK | Freq: Every day | ORAL | Status: DC | PRN

## 2020-03-05 MED ORDER — VANCOMYCIN HCL 1000 MG IV SOLR
INTRAVENOUS | Status: AC
  Filled 2020-03-05: qty 1000

## 2020-03-05 MED ORDER — CEFAZOLIN SODIUM-DEXTROSE 2-4 GM/100ML-% IV SOLN
2.0000 g | Freq: Three times a day (TID) | INTRAVENOUS | Status: AC
  Administered 2020-03-05 – 2020-03-06 (×2): 2 g via INTRAVENOUS
  Filled 2020-03-05 (×2): qty 100

## 2020-03-05 MED ORDER — VANCOMYCIN HCL 1 G IV SOLR
INTRAVENOUS | Status: DC | PRN
  Administered 2020-03-05: 1000 mg via TOPICAL

## 2020-03-05 MED ORDER — 0.9 % SODIUM CHLORIDE (POUR BTL) OPTIME
TOPICAL | Status: DC | PRN
  Administered 2020-03-05: 1000 mL

## 2020-03-05 MED ORDER — ALBUMIN HUMAN 5 % IV SOLN
12.5000 g | Freq: Once | INTRAVENOUS | Status: AC
  Administered 2020-03-05: 12.5 g via INTRAVENOUS

## 2020-03-05 MED ORDER — METHOCARBAMOL 1000 MG/10ML IJ SOLN
500.0000 mg | Freq: Four times a day (QID) | INTRAVENOUS | Status: DC | PRN
  Filled 2020-03-05: qty 5

## 2020-03-05 MED ORDER — OXYCODONE HCL 5 MG/5ML PO SOLN: 5.0000 mg | Freq: Once | ORAL | Status: DC | PRN

## 2020-03-05 MED ORDER — HYDROCODONE-ACETAMINOPHEN 7.5-325 MG PO TABS
1.0000 | ORAL_TABLET | ORAL | Status: DC | PRN
  Filled 2020-03-05: qty 2

## 2020-03-05 MED ORDER — ONDANSETRON HCL 4 MG/2ML IJ SOLN
4.0000 mg | Freq: Four times a day (QID) | INTRAMUSCULAR | Status: DC | PRN
  Administered 2020-03-07 – 2020-03-25 (×5): 4 mg via INTRAVENOUS
  Filled 2020-03-05 (×5): qty 2

## 2020-03-05 MED ORDER — METHOCARBAMOL 500 MG PO TABS
500.0000 mg | ORAL_TABLET | Freq: Four times a day (QID) | ORAL | Status: DC | PRN
  Administered 2020-03-08: 500 mg via ORAL
  Filled 2020-03-05 (×2): qty 1

## 2020-03-05 MED ORDER — MAGNESIUM CITRATE PO SOLN: 1.0000 | Freq: Once | ORAL | Status: DC | PRN

## 2020-03-05 MED ORDER — ONDANSETRON HCL 4 MG/2ML IJ SOLN: 4.0000 mg | Freq: Once | INTRAMUSCULAR | Status: DC | PRN

## 2020-03-05 MED ORDER — DEXAMETHASONE SODIUM PHOSPHATE 10 MG/ML IJ SOLN
INTRAMUSCULAR | Status: AC
  Filled 2020-03-05: qty 1

## 2020-03-05 MED ORDER — LIDOCAINE HCL (CARDIAC) PF 100 MG/5ML IV SOSY
PREFILLED_SYRINGE | INTRAVENOUS | Status: DC | PRN
  Administered 2020-03-05: 60 mg via INTRAVENOUS

## 2020-03-05 MED ORDER — BUPIVACAINE LIPOSOME 1.3 % IJ SUSP
INTRAMUSCULAR | Status: DC | PRN
  Administered 2020-03-05: 20 mL

## 2020-03-05 MED ORDER — PROPOFOL 10 MG/ML IV BOLUS
INTRAVENOUS | Status: AC
  Filled 2020-03-05: qty 20

## 2020-03-05 MED ORDER — BUPIVACAINE LIPOSOME 1.3 % IJ SUSP
20.0000 mL | Freq: Once | INTRAMUSCULAR | Status: DC
  Filled 2020-03-05: qty 20

## 2020-03-05 MED ORDER — FENTANYL CITRATE (PF) 100 MCG/2ML IJ SOLN: 25.0000 ug | INTRAMUSCULAR | Status: DC | PRN

## 2020-03-05 MED ORDER — HYDROCODONE-ACETAMINOPHEN 5-325 MG PO TABS
1.0000 | ORAL_TABLET | ORAL | Status: DC | PRN
  Administered 2020-03-06: 2 via ORAL
  Administered 2020-03-11 – 2020-03-19 (×2): 1 via ORAL
  Filled 2020-03-05: qty 2
  Filled 2020-03-05 (×2): qty 1

## 2020-03-05 MED ORDER — FENTANYL CITRATE (PF) 100 MCG/2ML IJ SOLN
INTRAMUSCULAR | Status: DC | PRN
  Administered 2020-03-05 (×3): 50 ug via INTRAVENOUS

## 2020-03-05 MED ORDER — ONDANSETRON HCL 4 MG/2ML IJ SOLN
INTRAMUSCULAR | Status: AC
  Filled 2020-03-05: qty 2

## 2020-03-05 MED ORDER — FENTANYL CITRATE (PF) 250 MCG/5ML IJ SOLN
INTRAMUSCULAR | Status: AC
  Filled 2020-03-05: qty 5

## 2020-03-05 MED ORDER — SODIUM CHLORIDE 0.9 % IR SOLN
Status: DC | PRN
  Administered 2020-03-05: 3000 mL

## 2020-03-05 MED ORDER — ALUM & MAG HYDROXIDE-SIMETH 200-200-20 MG/5ML PO SUSP
30.0000 mL | ORAL | Status: DC | PRN
  Administered 2020-03-17: 30 mL via ORAL
  Filled 2020-03-05 (×2): qty 30

## 2020-03-05 MED ORDER — DOCUSATE SODIUM 100 MG PO CAPS
100.0000 mg | ORAL_CAPSULE | Freq: Two times a day (BID) | ORAL | Status: DC
  Administered 2020-03-05 – 2020-03-07 (×5): 100 mg via ORAL
  Filled 2020-03-05 (×14): qty 1

## 2020-03-05 MED ORDER — MORPHINE SULFATE (PF) 2 MG/ML IV SOLN
1.0000 mg | INTRAVENOUS | Status: DC | PRN
  Administered 2020-03-08 – 2020-03-09 (×2): 1 mg via INTRAVENOUS
  Filled 2020-03-05 (×2): qty 1

## 2020-03-05 MED ORDER — OXYCODONE HCL 5 MG PO TABS: 5.0000 mg | ORAL_TABLET | Freq: Once | ORAL | Status: DC | PRN

## 2020-03-05 MED ORDER — ROCURONIUM BROMIDE 10 MG/ML (PF) SYRINGE
PREFILLED_SYRINGE | INTRAVENOUS | Status: AC
  Filled 2020-03-05: qty 10

## 2020-03-05 MED ORDER — ONDANSETRON HCL 4 MG PO TABS
4.0000 mg | ORAL_TABLET | Freq: Four times a day (QID) | ORAL | Status: DC | PRN
  Filled 2020-03-05: qty 1

## 2020-03-05 MED ORDER — SODIUM CHLORIDE 0.9 % IV SOLN: INTRAVENOUS | Status: DC

## 2020-03-05 MED ORDER — PROPOFOL 10 MG/ML IV BOLUS
INTRAVENOUS | Status: DC | PRN
  Administered 2020-03-05: 100 mg via INTRAVENOUS

## 2020-03-05 MED ORDER — PHENOL 1.4 % MT LIQD
1.0000 | OROMUCOSAL | Status: DC | PRN
  Administered 2020-03-13 – 2020-03-14 (×2): 1 via OROMUCOSAL
  Filled 2020-03-05: qty 177

## 2020-03-05 MED ORDER — ACETAMINOPHEN 500 MG PO TABS
500.0000 mg | ORAL_TABLET | Freq: Four times a day (QID) | ORAL | Status: AC
  Administered 2020-03-05 – 2020-03-06 (×3): 500 mg via ORAL
  Filled 2020-03-05 (×3): qty 1

## 2020-03-05 MED ORDER — LIDOCAINE 2% (20 MG/ML) 5 ML SYRINGE
INTRAMUSCULAR | Status: AC
  Filled 2020-03-05: qty 5

## 2020-03-05 MED ORDER — LACTATED RINGERS IV SOLN: INTRAVENOUS | Status: DC

## 2020-03-05 MED ORDER — CEFAZOLIN SODIUM 1 G IJ SOLR
INTRAMUSCULAR | Status: DC | PRN
  Administered 2020-03-05: 2 g via INTRAMUSCULAR

## 2020-03-05 MED ORDER — PHENYLEPHRINE 40 MCG/ML (10ML) SYRINGE FOR IV PUSH (FOR BLOOD PRESSURE SUPPORT)
PREFILLED_SYRINGE | INTRAVENOUS | Status: AC
  Filled 2020-03-05: qty 20

## 2020-03-05 MED ORDER — SUGAMMADEX SODIUM 200 MG/2ML IV SOLN
INTRAVENOUS | Status: DC | PRN
  Administered 2020-03-05: 160 mg via INTRAVENOUS

## 2020-03-05 MED ORDER — ACETAMINOPHEN 325 MG PO TABS
325.0000 mg | ORAL_TABLET | Freq: Four times a day (QID) | ORAL | Status: DC | PRN
  Administered 2020-03-24: 650 mg via ORAL

## 2020-03-05 MED ORDER — ENOXAPARIN SODIUM 40 MG/0.4ML ~~LOC~~ SOLN
40.0000 mg | SUBCUTANEOUS | Status: DC
  Administered 2020-03-06 – 2020-03-07 (×2): 40 mg via SUBCUTANEOUS
  Filled 2020-03-05 (×2): qty 0.4

## 2020-03-05 MED ORDER — OXYCODONE-ACETAMINOPHEN 5-325 MG PO TABS: 1.0000 | ORAL_TABLET | Freq: Three times a day (TID) | ORAL | 0 refills | Status: AC | PRN

## 2020-03-05 MED ORDER — BUPIVACAINE-EPINEPHRINE 0.25% -1:200000 IJ SOLN
INTRAMUSCULAR | Status: DC | PRN
  Administered 2020-03-05: 20 mL

## 2020-03-05 MED ORDER — ROCURONIUM BROMIDE 100 MG/10ML IV SOLN
INTRAVENOUS | Status: DC | PRN
  Administered 2020-03-05: 30 mg via INTRAVENOUS
  Administered 2020-03-05: 50 mg via INTRAVENOUS

## 2020-03-05 MED ORDER — PHENYLEPHRINE 40 MCG/ML (10ML) SYRINGE FOR IV PUSH (FOR BLOOD PRESSURE SUPPORT)
PREFILLED_SYRINGE | INTRAVENOUS | Status: DC | PRN
  Administered 2020-03-05 (×5): 40 ug via INTRAVENOUS
  Administered 2020-03-05 (×2): 80 ug via INTRAVENOUS

## 2020-03-05 MED ORDER — SODIUM CHLORIDE 0.9% FLUSH
INTRAVENOUS | Status: DC | PRN
  Administered 2020-03-05 (×2): 10 mL

## 2020-03-05 MED ORDER — TRANEXAMIC ACID 1000 MG/10ML IV SOLN
INTRAVENOUS | Status: DC | PRN
  Administered 2020-03-05: 2000 mg via TOPICAL

## 2020-03-05 MED ORDER — ALBUMIN HUMAN 5 % IV SOLN
INTRAVENOUS | Status: AC
  Filled 2020-03-05: qty 250

## 2020-03-05 MED ORDER — SORBITOL 70 % SOLN: 30.0000 mL | Freq: Every day | Status: DC | PRN

## 2020-03-05 SURGICAL SUPPLY — 66 items
BAG DECANTER FOR FLEXI CONT (MISCELLANEOUS) ×3 IMPLANT
BNDG ELASTIC 4X5.8 VLCR STR LF (GAUZE/BANDAGES/DRESSINGS) ×2 IMPLANT
BNDG ELASTIC 6X5.8 VLCR STR LF (GAUZE/BANDAGES/DRESSINGS) ×2 IMPLANT
CELLS DAT CNTRL 66122 CELL SVR (MISCELLANEOUS) IMPLANT
COVER PERINEAL POST (MISCELLANEOUS) ×3 IMPLANT
COVER SURGICAL LIGHT HANDLE (MISCELLANEOUS) ×3 IMPLANT
COVER WAND RF STERILE (DRAPES) ×3 IMPLANT
DERMABOND ADHESIVE PROPEN (GAUZE/BANDAGES/DRESSINGS) ×2
DERMABOND ADVANCED .7 DNX6 (GAUZE/BANDAGES/DRESSINGS) IMPLANT
DRAPE C-ARM 42X72 X-RAY (DRAPES) ×3 IMPLANT
DRAPE POUCH INSTRU U-SHP 10X18 (DRAPES) ×3 IMPLANT
DRAPE STERI IOBAN 125X83 (DRAPES) ×3 IMPLANT
DRAPE U-SHAPE 47X51 STRL (DRAPES) ×6 IMPLANT
DRSG AQUACEL AG ADV 3.5X10 (GAUZE/BANDAGES/DRESSINGS) ×3 IMPLANT
DURAPREP 26ML APPLICATOR (WOUND CARE) ×6 IMPLANT
ELECT BLADE 4.0 EZ CLEAN MEGAD (MISCELLANEOUS) ×3
ELECT REM PT RETURN 9FT ADLT (ELECTROSURGICAL) ×3
ELECTRODE BLDE 4.0 EZ CLN MEGD (MISCELLANEOUS) ×1 IMPLANT
ELECTRODE REM PT RTRN 9FT ADLT (ELECTROSURGICAL) ×1 IMPLANT
GLOVE BIOGEL PI IND STRL 7.0 (GLOVE) ×1 IMPLANT
GLOVE BIOGEL PI INDICATOR 7.0 (GLOVE) ×2
GLOVE ECLIPSE 7.0 STRL STRAW (GLOVE) ×6 IMPLANT
GLOVE SKINSENSE NS SZ7.5 (GLOVE) ×2
GLOVE SKINSENSE STRL SZ7.5 (GLOVE) ×1 IMPLANT
GLOVE SURG SYN 7.5  E (GLOVE) ×8
GLOVE SURG SYN 7.5 E (GLOVE) ×4 IMPLANT
GLOVE SURG SYN 7.5 PF PI (GLOVE) ×4 IMPLANT
GOWN STRL REIN XL XLG (GOWN DISPOSABLE) ×3 IMPLANT
GOWN STRL REUS W/ TWL LRG LVL3 (GOWN DISPOSABLE) IMPLANT
GOWN STRL REUS W/ TWL XL LVL3 (GOWN DISPOSABLE) ×1 IMPLANT
GOWN STRL REUS W/TWL LRG LVL3 (GOWN DISPOSABLE)
GOWN STRL REUS W/TWL XL LVL3 (GOWN DISPOSABLE) ×2
HANDPIECE INTERPULSE COAX TIP (DISPOSABLE) ×2
HOOD PEEL AWAY FLYTE STAYCOOL (MISCELLANEOUS) ×6 IMPLANT
IV NS IRRIG 3000ML ARTHROMATIC (IV SOLUTION) ×3 IMPLANT
KIT BASIN OR (CUSTOM PROCEDURE TRAY) ×3 IMPLANT
MARKER SKIN DUAL TIP RULER LAB (MISCELLANEOUS) ×3 IMPLANT
NDL SPNL 18GX3.5 QUINCKE PK (NEEDLE) ×1 IMPLANT
NEEDLE SPNL 18GX3.5 QUINCKE PK (NEEDLE) ×3 IMPLANT
PACK TOTAL JOINT (CUSTOM PROCEDURE TRAY) ×3 IMPLANT
PACK UNIVERSAL I (CUSTOM PROCEDURE TRAY) ×3 IMPLANT
PAD CAST 4YDX4 CTTN HI CHSV (CAST SUPPLIES) IMPLANT
PADDING CAST COTTON 4X4 STRL (CAST SUPPLIES) ×2
PADDING CAST COTTON 6X4 STRL (CAST SUPPLIES) ×2 IMPLANT
RETRACTOR WND ALEXIS 18 MED (MISCELLANEOUS) IMPLANT
RTRCTR WOUND ALEXIS 18CM MED (MISCELLANEOUS)
SAW OSC TIP CART 19.5X105X1.3 (SAW) ×3 IMPLANT
SET HNDPC FAN SPRY TIP SCT (DISPOSABLE) ×1 IMPLANT
SPLINT FIBERGLASS 3X35 (CAST SUPPLIES) ×2 IMPLANT
SPLINT FIBERGLASS 4X30 (CAST SUPPLIES) ×2 IMPLANT
STAPLER VISISTAT 35W (STAPLE) IMPLANT
SUT ETHIBOND 2 V 37 (SUTURE) ×3 IMPLANT
SUT PDS AB 1 CT  36 (SUTURE) ×2
SUT PDS AB 1 CT 36 (SUTURE) IMPLANT
SUT VIC AB 0 CT1 27 (SUTURE) ×2
SUT VIC AB 0 CT1 27XBRD ANBCTR (SUTURE) ×1 IMPLANT
SUT VIC AB 1 CTX 36 (SUTURE) ×2
SUT VIC AB 1 CTX36XBRD ANBCTR (SUTURE) ×1 IMPLANT
SUT VIC AB 2-0 CT1 27 (SUTURE) ×4
SUT VIC AB 2-0 CT1 TAPERPNT 27 (SUTURE) ×2 IMPLANT
SYR 50ML LL SCALE MARK (SYRINGE) ×3 IMPLANT
TOWEL GREEN STERILE (TOWEL DISPOSABLE) ×3 IMPLANT
TRAY CATH 16FR W/PLASTIC CATH (SET/KITS/TRAYS/PACK) IMPLANT
TRAY FOLEY W/BAG SLVR 16FR (SET/KITS/TRAYS/PACK) ×2
TRAY FOLEY W/BAG SLVR 16FR ST (SET/KITS/TRAYS/PACK) ×1 IMPLANT
YANKAUER SUCT BULB TIP NO VENT (SUCTIONS) ×3 IMPLANT

## 2020-03-05 NOTE — Anesthesia Procedure Notes (Signed)
Procedure Name: Intubation Date/Time: 03/05/2020 7:40 AM Performed by: Sammie Bench, CRNA Pre-anesthesia Checklist: Patient identified, Emergency Drugs available, Suction available and Patient being monitored Patient Re-evaluated:Patient Re-evaluated prior to induction Oxygen Delivery Method: Circle System Utilized Preoxygenation: Pre-oxygenation with 100% oxygen Induction Type: IV induction Ventilation: Mask ventilation without difficulty Laryngoscope Size: Mac and 3 Grade View: Grade I Tube type: Oral Tube size: 7.0 mm Number of attempts: 1 Airway Equipment and Method: Stylet and Oral airway Placement Confirmation: ETT inserted through vocal cords under direct vision,  positive ETCO2 and breath sounds checked- equal and bilateral Tube secured with: Tape Dental Injury: Teeth and Oropharynx as per pre-operative assessment

## 2020-03-05 NOTE — Anesthesia Postprocedure Evaluation (Signed)
Anesthesia Post Note  Patient: Whitney [name]  Procedure(s) Performed: HIP GIRDLE STONE (Right Hip)     Patient location during evaluation: PACU Anesthesia Type: General Level of consciousness: awake and alert Pain management: pain level controlled Vital Signs Assessment: post-procedure vital signs reviewed and stable Respiratory status: spontaneous breathing, nonlabored ventilation and respiratory function stable Cardiovascular status: blood pressure returned to baseline and stable Postop Assessment: no apparent nausea or vomiting Anesthetic complications: no    Last Vitals:  Vitals:   03/05/20 1035 03/05/20 1045  BP: (!) 149/77   Pulse: 96   Resp: 18   Temp:  36.4 C  SpO2: 100%     Last Pain:  Vitals:   03/05/20 1035  TempSrc:   PainSc: 0-No pain                 Audry Pili

## 2020-03-05 NOTE — H&P (Signed)

## 2020-03-05 NOTE — Progress Notes (Signed)
PROGRESS NOTE  Whitney [name] GQQ:[id number] DOB: [birth year] DOA: 03/02/2020 PCP: Patient, No Pcp Per   LOS: 2 days   Brief narrative: As per HPI,  Whitney [name] is a 85 y.o. female with medical history significant for insulin-dependent diabetes mellitus, chronic kidney disease stage III, and right hip fracture more than a month ago, now presenting to the emergency department with increasing right hip pain despite taking oxycodone and gabapentin at home.  Patient is accompanied by her daughter who assists with the history.  Patient previously lived in [place] but had since been in [place] where she suffered a right hip fracture more than a month ago.  She was seen in a hospital in [place] where she was treated with some pain medications and Lovenox for 4 flying to the [place].  She was briefly admitted to a hospital in [place] several days ago where she was seen by orthopedic surgery and discharged home with plans for outpatient follow-up.  She has since come to [place], continues to use oxycodone and gabapentin, but has been experiencing increasing pain in the right hip.  She denied any fevers, chills, chest pain, cough, or shortness of breath.  ED Course: Upon arrival to the ED, patient is found to be afebrile, saturating mid 90s on room air, and with stable blood pressure.  EKG features a sinus rhythm.  Radiographs of the right hip demonstrate right femoral neck fracture with varus angulation.  Chemistry panel notable for albumin 1.8 and creatinine 1.40, similar to priors.  Orthopedic surgery was consulted by the ED physician but did not feel that the patient requires inpatient surgical management.  She was given IV fentanyl in the ED but continues to complain of severe pain and hospitalists were asked to admit.   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 Unable to walk since 2011 and was on wheelchair.  Patient presented  with increasing pain. She had been taking oxycodone and gabapentin at home.  Radiographs with right femoral neck fracture with varus angulation .Orthopedic surgery was consulted and patient underwent right hip Girdlestone procedure on 03/04/20.   Continue analgesia, gabapentin, and IV narcotics as necessary.   CKD IIIb  Creatinine of 1.3 at baseline.   Check BMP in a.m.  Type II DM , insulin-dependent Hemoglobin A1c was 5.4% in April 2021. Continue CBGs, long- and short-acting insulin . On long acting and short acting insulin at home.  Continue with that.  Last POC glucose was 102.  Bilateral lower Edema   Patient has bilateral pitting LE edema with bullae and serous weeping. Likely secondary to stasis and hypoalbuminemia from malnutrition.  Does not have history of congestive heart failure.  No dyspnea reported.  Patient is mostly bedbound on a wheelchair.  Marland Kitchen  BNP within normal limits.  2D echocardiogram with preserved LV function.  Will need to encourage nutrition and mobility.  Protein calorie malnutrition, obesity unspecified.  Present on admission.  Nutritional services on board.  We will continue nutritional supplements. On ensure  Constipation.  Had a bowel movement yesterday.  We will continue  on MiraLAX stool  softeners    VTE Prophylaxis: Heparin subcu  Code Status: Full code  Family Communication:   Spoke with the patient's daughter on the phone at bedside.  Status is: Inpatient  The patient will require inpatient because: Ongoing active pain requiring inpatient pain management, Unsafe d/c plan, Inpatient level of care appropriate  due to severity of illness and status post surgery.  Dispo: The patient is from: Home              Anticipated d/c is to: SNF              Anticipated d/c date is: 2 to 3 days              Patient currently is not medically stable to d/c.  Consultants: Orthopedics  Procedures:   Right hip Girdlestone procedure by orthopedics on 03/05/2020  Right leg splint 03/05/2020  Antibiotics:  . None  Anti-infectives (From admission, onward)   Start     Dose/Rate Route Frequency Ordered Stop   03/05/20 0818  vancomycin (VANCOCIN) powder  Status:  Discontinued       As needed 03/05/20 0819 03/05/20 0913   03/05/20 0600  ceFAZolin (ANCEF) IVPB 2g/100 mL premix     2 g 200 mL/hr over 30 Minutes Intravenous On call to O.R. 03/04/20 1713 03/06/20 0559     Subjective: Today, patient was seen and examined at bedside.  Family at bedside.  Patient denies overt pain, nausea, vomiting.  Seen after hip surgery.  Had bowel movement yesterday  Objective: Vitals:   03/05/20 1005 03/05/20 1020  BP: (!) 102/48 125/67  Pulse: 89 100  Resp: 12 12  Temp:    SpO2: 100% 100%    Intake/Output Summary (Last 24 hours) at 03/05/2020 1038 Last data filed at 03/05/2020 0837 Gross per 24 hour  Intake 640 ml  Output 310 ml  Net 330 ml   Filed Weights   03/03/20 0421 03/03/20 0838  Weight: 79.7 kg 79.7 kg   Body mass index is 32.14 kg/m.   Physical Exam:  GENERAL: Patient is alert awake and communicative, not in obvious distress.  Obese HENT: No scleral pallor or icterus. Pupils equally reactive to light. Oral mucosa is moist NECK: is supple, no gross swelling noted. CHEST: Clear to auscultation. No crackles or wheezes.  Diminished breath sounds bilaterally. CVS: S1 and S2 heard, no murmur. Regular rate and rhythm.  ABDOMEN: Soft, non-tender, bowel sounds are present. EXTREMITIES: Bilateral lower extremity pitting edema on presentation: Right lower extremity unexplained.  Right hip surgery with  dressing.   CNS: Cranial nerves are intact. No focal motor deficits. SKIN: warm, bilateral lower extremity bullae, some bulla with rupture on presentation..    Data Review: I have personally reviewed the following laboratory data and studies,  CBC: Recent Labs  Lab 03/02/20 2117 03/03/20 0402 03/04/20 0504  WBC 6.2 5.9 4.6  NEUTROABS 4.1  --   --   HGB 11.0* 10.3* 9.6*  HCT 36.5 33.7* 31.9*  MCV 99.7 98.8 100.0  PLT 201 183 093   Basic Metabolic Panel: Recent Labs  Lab 03/02/20 2117 03/03/20 0402 03/04/20 0504  NA 137 143 143  K 4.3 4.0 4.3  CL 111 117* 117*  CO2 17* 19* 22  GLUCOSE 176* 147* 121*  BUN 32* 31* 29*  CREATININE 1.40* 1.30* 1.36*  CALCIUM 7.6* 7.7* 7.6*  MG  --   --  1.9   Liver Function Tests: Recent Labs  Lab 03/02/20 2117 03/03/20  0402 03/04/20 0504  AST 27 27 35  ALT 18 16 17   ALKPHOS 321* 282* 260*  BILITOT 0.8 0.4 0.2*  PROT 6.0* 5.4* 5.0*  ALBUMIN 1.8* 1.6* 1.4*   No results for input(s): LIPASE, AMYLASE in the last 168 hours. No results for input(s): AMMONIA in the last 168 hours. Cardiac Enzymes: No results for input(s): CKTOTAL, CKMB, CKMBINDEX, TROPONINI in the last 168 hours. BNP (last 3 results) Recent Labs    03/03/20 0402  BNP 74.9    ProBNP (last 3 results) No results for input(s): PROBNP in the last 8760 hours.  CBG: Recent Labs  Lab 03/04/20 1149 03/04/20 1654 03/04/20 2042 03/05/20 0609 03/05/20 0923  GLUCAP 165* 133* 145* 177* 214*   Recent Results (from the past 240 hour(s))  Respiratory Panel by RT PCR (Flu A&B, Covid) - Urine, Catheterized     Status: None   Collection Time: 03/02/20 11:41 PM   Specimen: Urine, Catheterized  Result Value Ref Range Status   SARS Coronavirus 2 by RT PCR NEGATIVE NEGATIVE Final    Comment: (NOTE) SARS-CoV-2 target nucleic acids are NOT DETECTED. The SARS-CoV-2 RNA is generally detectable in upper respiratoy specimens during the acute phase of infection. The  lowest concentration of SARS-CoV-2 viral copies this assay can detect is 131 copies/mL. A negative result does not preclude SARS-Cov-2 infection and should not be used as the sole basis for treatment or other patient management decisions. A negative result may occur with  improper specimen collection/handling, submission of specimen other than nasopharyngeal swab, presence of viral mutation(s) within the areas targeted by this assay, and inadequate number of viral copies (<131 copies/mL). A negative result must be combined with clinical observations, patient history, and epidemiological information. The expected result is Negative. Fact Sheet for Patients:  PinkCheek.be Fact Sheet for Healthcare Providers:  GravelBags.it This test is not yet ap proved or cleared by the [place] FDA and  has been authorized for detection and/or diagnosis of SARS-CoV-2 by FDA under an Emergency Use Authorization (EUA). This EUA will remain  in effect (meaning this test can be used) for the duration of the COVID-19 declaration under Section 564(b)(1) of the Act, 21 U.S.C. section 360bbb-3(b)(1), unless the authorization is terminated or revoked sooner.    Influenza A by PCR NEGATIVE NEGATIVE Final   Influenza B by PCR NEGATIVE NEGATIVE Final    Comment: (NOTE) The Xpert Xpress SARS-CoV-2/FLU/RSV assay is intended as an aid in  the diagnosis of influenza from Nasopharyngeal swab specimens and  should not be used as a sole basis for treatment. Nasal washings and  aspirates are unacceptable for Xpert Xpress SARS-CoV-2/FLU/RSV  testing. Fact Sheet for Patients: PinkCheek.be Fact Sheet for Healthcare Providers: GravelBags.it This test is not yet approved or cleared by the [place] FDA and  has been authorized for detection and/or diagnosis of SARS-CoV-2 by  FDA under an Emergency  Use Authorization (EUA). This EUA will remain  in effect (meaning this test can be used) for the duration of the  Covid-19 declaration under Section 564(b)(1) of the Act, 21  U.S.C. section 360bbb-3(b)(1), unless the authorization is  terminated or revoked. Performed at [facility], [place] [address] [place], [place] [address]   Surgical pcr screen     Status: None   Collection Time: 03/04/20  5:26 PM   Specimen: Nasal Mucosa; Nasal Swab  Result Value Ref Range Status   MRSA, PCR NEGATIVE NEGATIVE Final   Staphylococcus aureus NEGATIVE NEGATIVE Final  Comment: (NOTE) The Xpert SA Assay (FDA approved for NASAL specimens in patients 83 years of age and older), is one component of a comprehensive surveillance program. It is not intended to diagnose infection nor to guide or monitor treatment. Performed at [facility], [place] [address], [place], [place] [address]      Studies: CT HIP RIGHT WO CONTRAST  Result Date: 03/03/2020 CLINICAL DATA:  Hip fracture 1 month prior EXAM: CT OF THE RIGHT HIP WITHOUT CONTRAST TECHNIQUE: Multidetector CT imaging of the right hip was performed according to the standard protocol. Multiplanar CT image reconstructions were also generated. COMPARISON:  Radiograph 03/02/2020 FINDINGS: Bones/Joint/Cartilage Further demonstration of the impacted transcervical femoral neck fracture with marked varus angulation seen on comparison radiographs. There is a subacute appearance to the fracture margins with marked osteopenia and sclerotic changes without features of callus formation or bony bridging to suggest even and early healing response. There is minimal distension the joint capsule with some soft tissue thickening and adjacent stranding suggesting at least mild synovitis. The femoral head remains normally located within the acetabulum. Moderate underlying hip arthrosis is present. There is soft tissue thickening and chronic appearing bony destructive changes  at the symphysis pubis. Further thickening and erosive changes noted at the right SI joint. Age indeterminate bilateral sacral insufficiency fractures are noted as well. Exuberant enthesophytes are noted upon the greater trochanter and lesser trochanter as well as the ischial tuberosity. Ligaments Suboptimally assessed by CT. Muscles and Tendons There is diffuse atrophy of the proximal thigh musculature. No clear intramuscular hemorrhage or abscess is seen. No retracted torn tendons are evident. Soft tissues Circumferential soft tissue swelling of the right hip, may reflect a combination of edematous change and inflammation. Synovial changes of the right hip joint as above. There is focal soft tissue thickening posterior to the left ilium, with some irregular attenuation and soft tissue mineralization. Possibly posttraumatic or surgical in nature such as sequela of fat necrosis, though overall indeterminate. Included portions of the abdomen and pelvis demonstrate a large rectal stool ball with some mild perirectal fat stranding, which could reflect a mild stercoral colitis. Dense vascular calcifications noted in the uterus. IMPRESSION: 1. Impacted transcervical femoral neck fracture with marked varus angulation. There is a subacute appearance to the fracture margins with marked osteopenia and sclerotic changes. Mild right hip effusion and synovitis noted as well. 2. Moderate underlying hip arthrosis. 3. Soft tissue thickening and chronic appearing bony destructive changes at the symphysis pubis and right SI joint. 4. Age indeterminate bilateral sacral insufficiency fractures. 5. Large rectal stool ball with some mild perirectal fat stranding, which could reflect a mild stercoral colitis. 6. Focal soft tissue thickening posterior to the left ilium, with some irregular attenuation and soft tissue mineralization. Possibly posttraumatic or postsurgical changes sequela of fat necrosis. 7. Diffuse atrophy of the proximal  thigh musculature. Electronically Signed   By: Lovena Le M.D.   On: 03/03/2020 18:08   ECHOCARDIOGRAM COMPLETE  Result Date: 03/03/2020    ECHOCARDIOGRAM REPORT   Patient Name:   DANASIA Bragdon Date of Exam: 03/03/2020 Medical Rec #:  [id number]    Height:       62.0 in Accession #:    [id number]   Weight:       175.7 lb Date of Birth:  [birth year]    BSA:          1.809 m Patient Age:    40 years     BP:  125/73 mmHg Patient Gender: F            HR:           72 bpm. Exam Location:  Inpatient Procedure: 2D Echo Indications:    Congestive Heart Failure  History:        Patient has no prior history of Echocardiogram examinations.                 Risk Factors:Diabetes.  Sonographer:    Mikki Santee RDCS (AE) Referring Phys: [id number] [place]  1. Abnormal septal motion . Left ventricular ejection fraction, by estimation, is 55 to 60%. The left ventricle has normal function. The left ventricle has no regional wall motion abnormalities. Left ventricular diastolic parameters were normal.  2. Right ventricular systolic function is normal. The right ventricular size is normal.  3. The mitral valve is degenerative. Mild to moderate mitral valve regurgitation. No evidence of mitral stenosis.  4. The aortic valve is tricuspid. Aortic valve regurgitation is not visualized. Mild to moderate aortic valve sclerosis/calcification is present, without any evidence of aortic stenosis.  5. The inferior vena cava is normal in size with greater than 50% respiratory variability, suggesting right atrial pressure of 3 mmHg. FINDINGS  Left Ventricle: Abnormal septal motion. Left ventricular ejection fraction, by estimation, is 55 to 60%. The left ventricle has normal function. The left ventricle has no regional wall motion abnormalities. The left ventricular internal cavity size was normal in size. There is no left ventricular hypertrophy. Left ventricular diastolic parameters were normal. Right Ventricle:  The right ventricular size is normal. No increase in right ventricular wall thickness. Right ventricular systolic function is normal. Left Atrium: Left atrial size was normal in size. Right Atrium: Right atrial size was normal in size. Pericardium: There is no evidence of pericardial effusion. Mitral Valve: The mitral valve is degenerative in appearance. There is moderate thickening of the mitral valve leaflet(s). There is moderate calcification of the mitral valve leaflet(s). Normal mobility of the mitral valve leaflets. Severe mitral annular  calcification. Mild to moderate mitral valve regurgitation. No evidence of mitral valve stenosis. Tricuspid Valve: The tricuspid valve is normal in structure. Tricuspid valve regurgitation is not demonstrated. No evidence of tricuspid stenosis. Aortic Valve: The aortic valve is tricuspid. Aortic valve regurgitation is not visualized. Mild to moderate aortic valve sclerosis/calcification is present, without any evidence of aortic stenosis. Pulmonic Valve: The pulmonic valve was normal in structure. Pulmonic valve regurgitation is not visualized. No evidence of pulmonic stenosis. Aorta: The aortic root is normal in size and structure. Venous: The inferior vena cava is normal in size with greater than 50% respiratory variability, suggesting right atrial pressure of 3 mmHg. IAS/Shunts: The interatrial septum was not well visualized.  LEFT VENTRICLE PLAX 2D LVIDd:         3.80 cm  Diastology LVIDs:         3.20 cm  LV e' lateral:   7.27 cm/s LV PW:         1.10 cm  LV E/e' lateral: 14.3 LV IVS:        0.90 cm  LV e' medial:    3.94 cm/s LVOT diam:     2.00 cm  LV E/e' medial:  26.4 LV SV:         69 LV SV Index:   38 LVOT Area:     3.14 cm  RIGHT VENTRICLE RV S prime:     7.71 cm/s TAPSE (M-mode): 1.7  cm LEFT ATRIUM             Index       RIGHT ATRIUM           Index LA diam:        3.00 cm 1.66 cm/m  RA Area:     10.80 cm LA Vol (A2C):   40.0 ml 22.11 ml/m RA Volume:   18.60  ml  10.28 ml/m LA Vol (A4C):   36.6 ml 20.23 ml/m LA Biplane Vol: 39.3 ml 21.72 ml/m  AORTIC VALVE LVOT Vmax:   80.80 cm/s LVOT Vmean:  58.400 cm/s LVOT VTI:    0.221 m  AORTA Ao Root diam: 3.40 cm MITRAL VALVE MV Area (PHT): 3.77 cm     SHUNTS MV Decel Time: 201 msec     Systemic VTI:  0.22 m MV E velocity: 104.00 cm/s  Systemic Diam: 2.00 cm MV A velocity: 81.50 cm/s MV E/A ratio:  1.28 Jenkins Rouge MD Electronically signed by Jenkins Rouge MD Signature Date/Time: 03/03/2020/4:34:25 PM    Final      Flora Lipps, MD  [facility] 03/05/2020

## 2020-03-05 NOTE — Discharge Instructions (Signed)
° ° °  1. Change dressings as needed °2. May shower but keep incisions covered and dry °3. Take lovenox to prevent blood clots °4. Take stool softeners as needed °5. Take pain meds as needed ° °

## 2020-03-05 NOTE — Progress Notes (Signed)
PHARMACY NOTE:  ANTIMICROBIAL RENAL DOSAGE ADJUSTMENT  Current antimicrobial regimen includes a mismatch between antimicrobial dosage and estimated renal function.  As per policy approved by the Pharmacy & Therapeutics and Medical Executive Committees, the antimicrobial dosage will be adjusted accordingly.  Current antimicrobial dosage:  Ancef 2 gm IV Q 6 hrs X 3 doses  Indication:Surgical prophylaxis  Renal Function:  Estimated Creatinine Clearance: 31.4 mL/min (A) (by C-G formula based on SCr of 1.35 mg/dL (H)). []      On intermittent HD, scheduled: []      On CRRT    Antimicrobial dosage has been changed to:  Ancef 2 gm IV Q 8 hrs X 2 doses  Gillermina Hu, PharmD, BCPS, [facility] Clinical Pharmacist 03/05/2020 8:52 PM

## 2020-03-05 NOTE — Op Note (Addendum)
   HIP GIRDLE STONE  Procedure Note Whitney [name]   [id number]  Pre-op Diagnosis: Nonunion of basicervical right femoral neck     Post-op Diagnosis: same   Operative Procedures  1. Right hip girdlestone procedure  Personnel  Surgeon(s): Leandrew Koyanagi, MD  ASSIST: [name], PA-C; necessary for the timely completion of procedure and due to complexity of procedure.   Anesthesia: general  Op Note:   Patient is a 85 year old Whitney [name] who has been nonambulatory for the last 8 years status post multiple left hip surgeries.  She fell and broke her right hip in [place] 1 month ago and brought to the [place] by her family.  Despite conservative treatment, the patient continued to have excruciating pain that prohibited her from being positioned or moved in bed or to a chair.  Given the circumstances, I recommended a hip girdlestone procedure for pain relief.  After informed consent was obtained and the operative extremity marked in the holding area, the patient was brought back to the operating room and placed supine on the HANA table. Next, the operative extremity was prepped and draped in normal sterile fashion. Surgical timeout occurred verifying patient identification, surgical site, surgical procedure and administration of antibiotics.  A modified anterior Smith-Peterson approach to the hip was performed, using the interval between tensor fascia lata and sartorius.  Dissection was carried bluntly down onto the anterior hip capsule. The lateral femoral circumflex vessels were identified and coagulated.  A capsulectomy was performed.  The femoral head and neck were exposed and visualized after placing retractors.  While we were externally rotating the foot to 90 degrees in order to improve exposure we felt a snap in the lower extremity near the boot.  Fluoroscopy intraoperatively showed a nondisplaced spiral fracture of the distal tibial shaft.  The foot was then rotated back to 0 degrees.   The fracture stayed reduced.  I then used an osteotome to identify the nonunion site and an oscillating saw was used to complete the osteotomy.  The femoral head and neck were removed without difficulty.  Fluoroscopy was brought into the hip to confirm complete removal of the femoral head and neck.  The wound was then thoroughly irrigated.  Hemostasis was obtained.  Vancomycin powder was placed.  Exparel injection was placed in the soft tissues.  Topical TXA was placed in the hip joint.  Layered closure was performed.  Sterile dressings were applied.  The patient was gently moved back to the bed and a short leg splint was placed.  She tolerated the procedure well had no immediate complications.  Position: supine  Complications: see description of procedure.  Time Out: performed   Drains/Packing: none  Estimated blood loss: see anesthesia record  Returned to Recovery Room: in good condition.   Antibiotics: ancef  Mechanical VTE (DVT) Prophylaxis: sequential compression devices, TED thigh-high   Fluid Replacement: Crystalloid: see anesthesia record  Sponge and Instrument Count Correct? yes   PACU: portable radiograph - low AP   Plan/RTC: Return in 2 weeks for suture removal. Return in 6 weeks to see MD.  Hip precautions: none  N. Eduard Roux, MD [facility] 8:41 AM

## 2020-03-05 NOTE — Transfer of Care (Signed)
Immediate Anesthesia Transfer of Care Note  Patient: Whitney [name]  Procedure(s) Performed: HIP GIRDLE STONE (Right Hip)  Patient Location: PACU  Anesthesia Type:General  Level of Consciousness: awake, alert , oriented and patient cooperative  Airway & Oxygen Therapy: Patient Spontanous Breathing and Patient connected to nasal cannula oxygen  Post-op Assessment: Report given to RN and Post -op Vital signs reviewed and stable  Post vital signs: Reviewed and stable  Last Vitals:  Vitals Value Taken Time  BP 107/47 03/05/20 0921  Temp 36.4 C 03/05/20 0920  Pulse 94 03/05/20 0924  Resp 15 03/05/20 0924  SpO2 100 % 03/05/20 0924  Vitals shown include unvalidated device data.  Last Pain:  Vitals:   03/05/20 0920  TempSrc:   PainSc: Asleep      Patients Stated Pain Goal: 0 (09/38/18 2993)  Complications: No apparent anesthesia complications

## 2020-03-06 LAB — BASIC METABOLIC PANEL
Anion gap: 6 (ref 5–15)
BUN: 32 mg/dL — ABNORMAL HIGH (ref 8–23)
CO2: 21 mmol/L — ABNORMAL LOW (ref 22–32)
Calcium: 7.5 mg/dL — ABNORMAL LOW (ref 8.9–10.3)
Chloride: 115 mmol/L — ABNORMAL HIGH (ref 98–111)
Creatinine, Ser: 1.45 mg/dL — ABNORMAL HIGH (ref 0.44–1.00)
GFR calc Af Amer: 39 mL/min — ABNORMAL LOW (ref >60)
GFR calc non Af Amer: 33 mL/min — ABNORMAL LOW (ref >60)
Glucose, Bld: 249 mg/dL — ABNORMAL HIGH (ref 70–99)
Potassium: 4.9 mmol/L (ref 3.5–5.1)
Sodium: 142 mmol/L (ref 135–145)

## 2020-03-06 LAB — CBC
HCT: 27 % — ABNORMAL LOW (ref 36.0–46.0)
Hemoglobin: 8.1 g/dL — ABNORMAL LOW (ref 12.0–15.0)
MCH: 30 pg (ref 26.0–34.0)
MCHC: 30 g/dL (ref 30.0–36.0)
MCV: 100 fL (ref 80.0–100.0)
Platelets: 136 10*3/uL — ABNORMAL LOW (ref 150–400)
RBC: 2.7 MIL/uL — ABNORMAL LOW (ref 3.87–5.11)
RDW: 15.6 % — ABNORMAL HIGH (ref 11.5–15.5)
WBC: 10.2 10*3/uL (ref 4.0–10.5)
nRBC: 0 % (ref 0.0–0.2)

## 2020-03-06 LAB — GLUCOSE, CAPILLARY
Glucose-Capillary: 205 mg/dL — ABNORMAL HIGH (ref 70–99)
Glucose-Capillary: 167 mg/dL — ABNORMAL HIGH (ref 70–99)
Glucose-Capillary: 122 mg/dL — ABNORMAL HIGH (ref 70–99)
Glucose-Capillary: 138 mg/dL — ABNORMAL HIGH (ref 70–99)

## 2020-03-06 MED ORDER — BISACODYL 10 MG RE SUPP
10.0000 mg | Freq: Every day | RECTAL | Status: DC
  Administered 2020-03-06 – 2020-03-18 (×3): 10 mg via RECTAL
  Filled 2020-03-06 (×3): qty 1

## 2020-03-06 MED ORDER — ALBUMIN HUMAN 25 % IV SOLN
12.5000 g | Freq: Once | INTRAVENOUS | Status: AC
  Administered 2020-03-06: 12.5 g via INTRAVENOUS
  Filled 2020-03-06: qty 50

## 2020-03-06 NOTE — Progress Notes (Signed)
Inpatient Diabetes Program Recommendations  AACE/ADA: New Consensus Statement on Inpatient Glycemic Control (2015)  Target Ranges:  Prepandial:   less than 140 mg/dL      Peak postprandial:   less than 180 mg/dL (1-2 hours)      Critically ill patients:  140 - 180 mg/dL   Lab Results  Component Value Date   GLUCAP 167 (H) 03/06/2020    Review of Glycemic Control  Diabetes history: DM2 Outpatient Diabetes medications: Lantus 15 units QHS, Humalog 3 units tidwc Current orders for Inpatient glycemic control: Lantus 10 units QHS, Novolog 0-9 units tidwc and hs   Inpatient Diabetes Program Recommendations:     Increase Lantus to 12 units QHS Add Novolog 2 units tidwc for meal coverage insulin if pt eats > 50% meal HgbA1C to assess glycemic control PTA  Will continue to follow.  Thank you. Lorenda Peck, RD, LDN, CDE Inpatient Diabetes Coordinator [phone]

## 2020-03-06 NOTE — TOC Initial Note (Signed)
Transition of Care [facility]) - Initial/Assessment Note    Patient Details  Name: Whitney [name] MRN: [id number] Date of Birth: September 07, 1935  Transition of [facility]) CM/SW Contact:    Curlene Labrum, RN Phone Number: 03/06/2020, 4:37 PM  Clinical Narrative:                 Whitney [name] is a 85 y.o. female with medical history significant for insulin-dependent diabetes mellitus, chronic kidney disease stage III, and right hip fracture more than a month ago, presented to the hospital with increasing right hip pain. Patient previously lived in [place] but had since been in [place] where she suffered a right hip fracture more than a month ago.  She was seen in a hospital in [place] where she was treated with some pain medications and Lovenox before flying to the [place].  She was briefly admitted to a hospital in [place] several days ago where she was seen by orthopedic surgery and discharged home with plans for outpatient follow-up.  She has since come to [place], continues to use oxycodone and gabapentin, but has been experiencing increasing pain in the right hip.  Orthopedic surgery was consulted and patient underwent right hip Girdlestone procedure on 03/04/20, during the surgery the patient sustained a spiral fracture of her right distal tibia.  Patient's daughter is at the bedside with the patient.  Discussed transition of care options with the daughter.  The patient is covered by [employer] health insurance according to the daughter and does not have Medicare Insurance coverage since she resides outside of the country.  The daughter is unable and unwilling to pay out of pocket for SNF placement to provide 24 hour coverage.  The daughter states that she will speak to the physician in the morning concerning obtaining a medical letter of patient's surgery and health care needs at home so that she can fly a family member to come and live with the family to provide 24 hour supervision in  the home.    The daughter lives on the 3rd story of an apartment complex without elevator access.  She states that she is talking to the apartment manager about moving the apartment to the lower level.  The daughter was offered choice regarding home health and DME provider.  The daughter does not have a preference for the patient.  [facility] called and will be providing home health PT/OT.  Plan to call [employer] in the morning to arrange home health equipment to include hospital bed, hoyer lift, Wheelchair, and 3:1 for the home.  Expected Discharge Plan: [place] Barriers to Discharge: Family Issues   Patient Goals and CMS Choice Patient states their goals for this hospitalization and ongoing recovery are:: Patient lives with her daughter and was flown to the [place] to have surgery. CMS Medicare.gov Compare Post Acute Care list provided to:: Patient Represenative (must comment)([name], daughter) Choice offered to / list presented to : [place] / Guardian  Expected Discharge Plan and Services Expected Discharge Plan: [place]   Discharge Planning Services: CM Consult Post Acute Care Choice: Durable Medical Equipment, Home Health Living arrangements for the past 2 months: Apartment(Patient was flown from [place] to [place] to have surgery.)                 DME Arranged: 3-N-1, Hospital bed, Other see comment, [place](Hoyer lift) DME Agency: [employer] Date DME Agency Contacted: 03/06/20 Time DME Agency Contacted: [phone] Representative  spoke with at DME Agency: [place]: PT, OT [place] Agency: [facility] (formerly [employer]) Date [place]: 03/06/20 Time [place]: [phone] Representative spoke with at [place]: Joen Laura  Prior Living Arrangements/Services Living arrangements for the past 2 months: Apartment(Patient was flown from [place] to [place] to have surgery.) Lives with:: Adult Children Patient language and need for  interpreter reviewed:: Yes Do you feel safe going back to the place where you live?: Yes      Need for Family Participation in Patient Care: Yes (Comment) Care giver support system in place?: Yes (comment)(Daughter is needing physician note to have daughter flown from [place] to help care for patient.)   Criminal Activity/Legal Involvement Pertinent to Current Situation/Hospitalization: No - Comment as needed  Activities of Daily Living Home Assistive Devices/Equipment: None ADL Screening (condition at time of admission) Patient's cognitive ability adequate to safely complete daily activities?: No Is the patient deaf or have difficulty hearing?: No Does the patient have difficulty seeing, even when wearing glasses/contacts?: No Does the patient have difficulty concentrating, remembering, or making decisions?: No Patient able to express need for assistance with ADLs?: Yes(Pt only speaks Arabic) Does the patient have difficulty dressing or bathing?: Yes Independently performs ADLs?: No Communication: Needs assistance(Pt only speaks Arabic) Is this a change from baseline?: Pre-admission baseline Dressing (OT): Dependent Is this a change from baseline?: Pre-admission baseline Grooming: Dependent Is this a change from baseline?: Pre-admission baseline Feeding: Needs assistance Is this a change from baseline?: Pre-admission baseline Bathing: Dependent Is this a change from baseline?: Pre-admission baseline Toileting: Dependent Is this a change from baseline?: Pre-admission baseline In/Out Bed: Dependent Is this a change from baseline?: Pre-admission baseline Walks in Home: Dependent Is this a change from baseline?: Pre-admission baseline Does the patient have difficulty walking or climbing stairs?: Yes Weakness of Legs: Both Weakness of Arms/Hands: None  Permission Sought/Granted Permission sought to share information with : Case Manager Permission granted to share information with :  Yes, Verbal Permission Granted     Permission granted to share info w AGENCY: Home health agency  Permission granted to share info w Relationship: daughter     Emotional Assessment Appearance:: Appears stated age Attitude/Demeanor/Rapport: Engaged Affect (typically observed): Accepting Orientation: : Oriented to Self, Oriented to Place, Oriented to  Time Alcohol / Substance Use: Not Applicable Psych Involvement: No (comment)  Admission diagnosis:  Dehydration [E86.0] Intractable pain [R52] Closed fracture of right hip, initial encounter ([place]) [S72.001A] Closed right hip fracture, initial encounter [facility]) [S72.001A] Patient Active Problem List   Diagnosis Date Noted  . Closed right hip fracture, initial encounter ([place]) 03/03/2020  . Diabetes mellitus without complication ([place])   . CKD (chronic kidney disease), stage III   . Closed right hip fracture ([place])   . Protein calorie malnutrition ([place])   . Bilateral leg edema   . Intractable pain 03/02/2020   PCP:  Patient, No Pcp Per Pharmacy:   [id number] [place], [address] [place] [address] Phone: [phone] Fax: [phone]     Social Determinants of Health (SDOH) Interventions    Readmission Risk Interventions No flowsheet data found.

## 2020-03-06 NOTE — Progress Notes (Signed)
Subjective: 1 Day Post-Op Procedure(s) (LRB): HIP GIRDLE STONE (Right) Patient reports pain as moderate.  Daughter in room with patient.  Says she has moderate pain to the right hip.  Not much to the right lower leg  Objective: Vital signs in last 24 hours: Temp:  [97.3 F (36.3 C)-97.4 F (36.3 C)] 97.4 F (36.3 C) (04/26 1341) Pulse Rate:  [84-92] 86 (04/26 1341) Resp:  [14-18] 18 (04/26 1341) BP: (81-113)/(34-72) 105/51 (04/26 1341) SpO2:  [92 %-100 %] 98 % (04/26 1402)  Intake/Output from previous day: 04/25 0701 - 04/26 0700 In: 2366.8 [I.V.:2016.8; IV Piggyback:350] Out: 710 [Urine:500; Blood:210] Intake/Output this shift: Total I/O In: 459.9 [I.V.:459.9] Out: -   Recent Labs    03/04/20 0504 03/05/20 1524 03/06/20 0423  HGB 9.6* 8.6* 8.1*   Recent Labs    03/05/20 1524 03/06/20 0423  WBC 10.9* 10.2  RBC 2.84* 2.70*  HCT 28.1* 27.0*  PLT 139* 136*   Recent Labs    03/05/20 1524 03/06/20 0423  NA 140 142  K 4.8 4.9  CL 113* 115*  CO2 23 21*  BUN 31* 32*  CREATININE 1.35* 1.45*  GLUCOSE 313* 249*  CALCIUM 7.6* 7.5*   No results for input(s): LABPT, INR in the last 72 hours.  Neurologically intact Neurovascular intact Sensation intact distally Incision: scant drainage  Splint in place to RLE- able to wiggle toes.  Toes are warm and well perfused   Assessment/Plan: 1 Day Post-Op Procedure(s) (LRB): HIP GIRDLE STONE (Right) Up with therapy NWB RLE Continue splint and elevation to RLE DVT ppx- continue Lovenox F/u with Dr. Erlinda Hong 2 weeks post-op     Aundra Dubin 03/06/2020, 5:01 PM

## 2020-03-06 NOTE — Evaluation (Signed)
Occupational Therapy Evaluation Patient Details Name: Whitney [name] MRN: [id number] DOB: 09/01/1935 Today's Date: 03/06/2020    History of Present Illness Shauniece [name] is a 85 y.o. female with medical history significant for insulin-dependent diabetes mellitus, chronic kidney disease stage III, and right hip fracture more than a month ago, presented to the hospital with increasing right hip pain. Patient previously lived in [place] but had since been in [place] where she suffered a right hip fracture more than a month ago.  She was seen in a hospital in [place] where she was treated with some pain medications and Lovenox before flying to the [place].  She was briefly admitted to a hospital in [place] several days ago where she was seen by orthopedic surgery and discharged home with plans for outpatient follow-up.  She has since come to [place], continues to use oxycodone and gabapentin, but has been experiencing increasing pain in the right hip.  Orthopedic surgery was consulted and patient underwent right hip Girdlestone procedure on 03/04/20, during the surgery the patient sustained a spiral fracture of her right distal tibia.   Clinical Impression   Pt admitted with above. She demonstrates the below listed deficits and will benefit from continued OT to maximize safety and independence with BADLs.  Pt very limited by pain, and eval was limited to bed mobility due to pain and need to use bed pan.  Pt currently requires min - total A for ADLs and total A +2 for bed mobility.  She is from [place].  Her daughter brought her to [place] to provide care, but currently lives in a second floor apartment.  She is in process of  Acquiring a ground floor apartment. Pt was able to assist with scoot tranfers and assist with UB ADLs.   Pt will likely require post acute rehab at discharge.  Will follow.      Follow Up Recommendations  SNF;Supervision/Assistance - 24 hour    Equipment Recommendations     Recommendations for Other Services       Precautions / Restrictions Precautions Precautions: Fall Required Braces or Orthoses: Splint/Cast Splint/Cast: R LE Restrictions Weight Bearing Restrictions: Yes RLE Weight Bearing: Non weight bearing      Mobility Bed Mobility Overal bed mobility: Needs Assistance Bed Mobility: Rolling Rolling: +2 for physical assistance;Total assist         General bed mobility comments: patient required cleaning of bottom, therefore assisted patient with rolling and clean up. Patient has pain with rolling especially to her right side. Requires +2 total assist.  Transfers                 General transfer comment: not attempted due to continued stooling, and increased pain     Balance                                           ADL either performed or assessed with clinical judgement   ADL Overall ADL's : Needs assistance/impaired Eating/Feeding: Minimal assistance;Bed level   Grooming: Wash/dry hands;Wash/dry face;Set up;Bed level   Upper Body Bathing: Maximal assistance;Bed level   Lower Body Bathing: Total assistance;Bed level   Upper Body Dressing : Total assistance;Bed level   Lower Body Dressing: Total assistance;Bed level   Toilet Transfer: +2 for physical assistance;Total assistance Toilet Transfer Details (indicate cue type and reason): rolling Lt and Rt for bedpan  Toileting-  Clothing Manipulation and Hygiene: Total assistance;Bed level Toileting - Clothing Manipulation Details (indicate cue type and reason): Pt required assist with all aspects of peri care      Functional mobility during ADLs: +2 for physical assistance;Total assistance(rolling only )       Vision         Perception     Praxis      Pertinent Vitals/Pain Pain Assessment: Faces Faces Pain Scale: Hurts whole lot Pain Location: R LE Pain Descriptors / Indicators: Grimacing;Guarding;Moaning;Crying Pain Intervention(s):  Monitored during session;Limited activity within patient's tolerance;Repositioned     Hand Dominance Right   Extremity/Trunk Assessment Upper Extremity Assessment Upper Extremity Assessment: Generalized weakness   Lower Extremity Assessment Lower Extremity Assessment: Defer to PT evaluation       Communication Communication Communication: Prefers language other than English   Cognition Arousal/Alertness: Awake/alert Behavior During Therapy: WFL for tasks assessed/performed Overall Cognitive Status: Difficult to assess                                 General Comments: does follow basic commands    General Comments       Exercises     Shoulder Instructions      Home Living Family/patient expects to be discharged to:: Unsure                                 Additional Comments: pt arrived from [place] via planes.  she required ambulance transport and EMS support to board and debark planes .  Daughter lives on second floor apartment and is in process of acquiring a ground floor apartment       Prior Functioning/Environment Level of Independence: Needs assistance  Gait / Transfers Assistance Needed: Patient is non-ambulatory at baseline. She was able to scoot and assist from bed to wheelchair per daughter. ADL's / Homemaking Assistance Needed: Requires assistance, per daughter prior to the last 2 years she could assist with upper body bathing and dressing   Comments: Daughter reports that prior to [place] pt was able to participate in UB ADLs and assist wtih transfer, but she lost her caregiver during COVID        OT Problem List: Decreased strength;Decreased activity tolerance;Impaired balance (sitting and/or standing);Decreased safety awareness;Decreased knowledge of use of DME or AE;Decreased knowledge of precautions;Obesity;Pain      OT Treatment/Interventions: Self-care/ADL training;Therapeutic exercise;Therapeutic activities;Patient/family  education;Balance training;DME and/or AE instruction    OT Goals(Current goals can be found in the care plan section) Acute Rehab OT Goals Patient Stated Goal: for pt to be able to do more for self and become stronger  OT Goal Formulation: With patient/family Time For Goal Achievement: 03/20/20 Potential to Achieve Goals: Fair ADL Goals Pt Will Perform Eating: with set-up;bed level;sitting Pt Will Perform Grooming: with min assist;sitting;bed level Pt Will Perform Upper Body Bathing: with set-up;with supervision;sitting;bed level Pt Will Transfer to Toilet: with +2 assist;bedside commode;with transfer board;with mod assist Pt/caregiver will Perform Home Exercise Program: Increased strength;Right Upper extremity;Left upper extremity;With Supervision;With theraband Additional ADL Goal #1: Pt will maintain EOB sitting with min guard assist x 7 mins in prep for functional transfers  OT Frequency: Min 2X/week   Barriers to D/C:    apartment currently is not accessible        Co-evaluation PT/OT/SLP Co-Evaluation/Treatment: Yes Reason for Co-Treatment: Complexity of the patient's impairments (  multi-system involvement);To address functional/ADL transfers PT goals addressed during session: Mobility/safety with mobility OT goals addressed during session: ADL's and self-care      AM-PAC OT "6 Clicks" Daily Activity     Outcome Measure Help from another person eating meals?: A Little Help from another person taking care of personal grooming?: A Lot Help from another person toileting, which includes using toliet, bedpan, or urinal?: Total Help from another person bathing (including washing, rinsing, drying)?: A Lot Help from another person to put on and taking off regular upper body clothing?: Total Help from another person to put on and taking off regular lower body clothing?: Total 6 Click Score: 10   End of Session Nurse Communication: Mobility status  Activity Tolerance: Patient  limited by pain Patient left: in bed;with call bell/phone within reach;with nursing/sitter in room;with family/visitor present  OT Visit Diagnosis: Pain Pain - Right/Left: Right Pain - part of body: Hip;Leg                Time: 1325-1349 OT Time Calculation (min): 24 min Charges:  OT General Charges $OT Visit: 1 Visit OT Evaluation $OT Eval Moderate Complexity: 1 Mod  Nilsa Nutting., OTR/L Acute Rehabilitation Services Pager [phone] Office [phone]   Lucille Passy M 03/06/2020, 4:15 PM

## 2020-03-06 NOTE — Plan of Care (Signed)
  Problem: Education: Goal: Verbalization of understanding the information provided (i.e., activity precautions, restrictions, etc) will improve Outcome: Progressing   Problem: Activity: Goal: Ability to ambulate and perform ADLs will improve Outcome: Progressing   Problem: Pain Management: Goal: Pain level will decrease Outcome: Progressing

## 2020-03-06 NOTE — Progress Notes (Addendum)
Vital Signs: Temp: 97.3 F oral BP: 83/44  HR: 86 Resp: 18 SpO2: 92 % on room air   Repeat manual BP 110/50 on right arm  Patient placed on 2L of O2 overnight    On-Call MD [place] notified of temp and BP   No new orders

## 2020-03-06 NOTE — Evaluation (Addendum)
Physical Therapy Evaluation Patient Details Name: Whitney [name] MRN: [id number] DOB: 1935/08/23 Today's Date: 03/06/2020   History of Present Illness  Whitney [name] is a 85 y.o. female with medical history significant for insulin-dependent diabetes mellitus, chronic kidney disease stage III, and right hip fracture more than a month ago, presented to the hospital with increasing right hip pain. Patient previously lived in [place] but had since been in [place] where she suffered a right hip fracture more than a month ago.  She was seen in a hospital in [place] where she was treated with some pain medications and Lovenox before flying to the [place].  She was briefly admitted to a hospital in [place] several days ago where she was seen by orthopedic surgery and discharged home with plans for outpatient follow-up.  She has since come to [place], continues to use oxycodone and gabapentin, but has been experiencing increasing pain in the right hip.  Orthopedic surgery was consulted and patient underwent right hip Girdlestone procedure on 03/04/20, during the surgery the patient sustained a spiral fracture of her right distal tibia.  Clinical Impression  Patient received in bed, being cleaned up by nursing staff. Patient reports significant pain in R LE with bed mobility. She requires total assist +2 for bed mobility at this time. She has limitations in strength, balance and functional mobility. She will benefit from continued skilled PT while here to improve in these areas and reduce caregiver burden.          Follow Up Recommendations SNF;Supervision/Assistance - 24 hour    Equipment Recommendations  Wheelchair (measurements PT);Wheelchair cushion (measurements PT);Hospital bed    Recommendations for Other Services       Precautions / Restrictions Precautions Precautions: Fall Required Braces or Orthoses: Splint/Cast Splint/Cast: R LE Restrictions Weight Bearing Restrictions:  Yes RLE Weight Bearing: Non weight bearing      Mobility  Bed Mobility Overal bed mobility: Needs Assistance Bed Mobility: Rolling Rolling: +2 for physical assistance;Total assist         General bed mobility comments: patient required cleaning of bottom, therefore assisted patient with rolling and clean up. Patient has pain with rolling especially to her right side. Requires +2 total assist.  Transfers                 General transfer comment: not attempted this day as patient continuing to have BM after being cleaned.  Placed back on bed pan  Ambulation/Gait             General Gait Details: patient is non-ambulatory at baseline  Stairs            Wheelchair Mobility    Modified Rankin (Stroke Patients Only)       Balance                                             Pertinent Vitals/Pain Pain Assessment: Faces Faces Pain Scale: Hurts whole lot Pain Location: R LE Pain Descriptors / Indicators: Discomfort;Sore;Moaning;Grimacing;Guarding Pain Intervention(s): Monitored during session;Limited activity within patient's tolerance;Repositioned    Home Living Family/patient expects to be discharged to:: Skilled nursing facility                      Prior Function Level of Independence: Needs assistance   Gait / Transfers Assistance Needed: Patient is non-ambulatory at baseline.  She was able to scoot and assist from bed to wheelchair per daughter.  ADL's / Homemaking Assistance Needed: Requires assistance, per daughter prior to the last 2 years she could assist with upper body bathing and dressing        Hand Dominance        Extremity/Trunk Assessment   Upper Extremity Assessment Upper Extremity Assessment: Defer to OT evaluation    Lower Extremity Assessment Lower Extremity Assessment: Generalized weakness       Communication   Communication: Prefers language other than English  Cognition  Arousal/Alertness: Awake/alert Behavior During Therapy: WFL for tasks assessed/performed Overall Cognitive Status: Difficult to assess                                        General Comments      Exercises     Assessment/Plan    PT Assessment Patient needs continued PT services  PT Problem List Decreased strength;Decreased activity tolerance;Decreased balance;Pain;Decreased mobility;Decreased safety awareness;Obesity       PT Treatment Interventions Therapeutic exercise;Functional mobility training;Therapeutic activities;Patient/family education    PT Goals (Current goals can be found in the Care Plan section)  Acute Rehab PT Goals Patient Stated Goal: for patient to get stronger more independent PT Goal Formulation: With family Time For Goal Achievement: 03/20/20 Potential to Achieve Goals: Fair    Frequency Min 3X/week   Barriers to discharge Inaccessible home environment patient is going to stay with daughter here in [place], but daughter currently lives in second floor apartment. Trying to get moved to first floor.    Co-evaluation               AM-PAC PT "6 Clicks" Mobility  Outcome Measure Help needed turning from your back to your side while in a flat bed without using bedrails?: Total Help needed moving from lying on your back to sitting on the side of a flat bed without using bedrails?: Total Help needed moving to and from a bed to a chair (including a wheelchair)?: Total Help needed standing up from a chair using your arms (e.g., wheelchair or bedside chair)?: Total Help needed to walk in hospital room?: Total Help needed climbing 3-5 steps with a railing? : Total 6 Click Score: 6    End of Session   Activity Tolerance: Patient limited by pain Patient left: in bed;with family/visitor present Nurse Communication: Mobility status PT Visit Diagnosis: Other abnormalities of gait and mobility (R26.89);Muscle weakness (generalized)  (M62.81);Pain Pain - Right/Left: Right Pain - part of body: Hip;Leg    Time: 1320-1400 PT Time Calculation (min) (ACUTE ONLY): 40 min   Charges:   PT Evaluation $PT Eval Moderate Complexity: 1 Mod PT Treatments $Therapeutic Activity: 8-22 mins        Whitney [name], PT, GCS 03/06/20,2:17 PM

## 2020-03-06 NOTE — Progress Notes (Addendum)
PROGRESS NOTE  Whitney [name] BHA:[id number] DOB: [birth year] DOA: 03/02/2020 PCP: Patient, No Pcp Per   LOS: 3 days   Brief narrative: As per HPI,  Whitney [name] is a 85 y.o. female with medical history significant for insulin-dependent diabetes mellitus, chronic kidney disease stage III, and right hip fracture more than a month ago, presented to the hospital with increasing right hip pain. Patient previously lived in [place] but had since been in [place] where she suffered a right hip fracture more than a month ago.  She was seen in a hospital in [place] where she was treated with some pain medications and Lovenox before flying to the [place].  She was briefly admitted to a hospital in [place] several days ago where she was seen by orthopedic surgery and discharged home with plans for outpatient follow-up.  She has since come to [place], continues to use oxycodone and gabapentin, but has been experiencing increasing pain in the right hip.  ED Course: Upon arrival to the ED, patient is found to be afebrile, saturating mid 90s on room air, and with stable blood pressure.  EKG showed sinus rhythm.  Radiographs of the right hip demonstrate right femoral neck fracture with varus angulation.  Chemistry panel was notable for albumin 1.8 and creatinine 1.40, similar to priors.  Orthopedic surgery was consulted by the ED physician but did not feel that the patient requires inpatient surgical management.  She was given IV fentanyl in the ED but continued to complain of severe pain so hospitalists were asked to admit.  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 Unable to walk since 2011 and was on wheelchair.  Patient presented  with increasing pain. She had been taking oxycodone and gabapentin at home.  Radiographs with right femoral neck fracture with varus angulation. Orthopedic surgery was consulted and patient underwent right hip Girdlestone procedure on 03/04/20.   Continue analgesia, gabapentin, and IV narcotics as necessary.  Orthopedic recommends return in 2 weeks for suture removal in 6 weeks as follow-up.  Hypotension today.  On IV fluids.  Will give 1 dose of IV albumin.  Closely monitor.   CKD IIIb  Creatinine of 1.4 at baseline.   Check BMP in a.m.  Type II DM , insulin-dependent Hemoglobin A1c was 5.4% in April 2021. Continue CBGs, long- and short-acting insulin . On long acting and short acting insulin at home.  Continue with that.  Last POC glucose was 102.  Bilateral lower Edema   Patient has bilateral pitting LE edema with bullae and serous weeping.  Likely third spacing secondary to hypoalbuminemia. Likely secondary to stasis and hypoalbuminemia from malnutrition.  Does not have history of congestive heart failure.   Patient is mostly bedbound on a wheelchair at baseline  .  BNP within normal limits.  2D echocardiogram with preserved LV function.  Will need to encourage nutrition and mobility.  Protein calorie malnutrition, obesity unspecified.  Present on admission.  Nutritional services on board.  We will continue nutritional supplements. On ensure  Constipation.  Continue  On MiraLax, stool softeners .  Still  complains of abdominal distention.  Will try Dulcolax suppository today.   VTE Prophylaxis: Heparin subcu   Code Status: Full code  Family Communication:   None today.  Spoke with the patient's daughter at bedside yesterday.  Status is: Inpatient  The patient will require inpatient because:  Inpatient level of care appropriate due to severity of  illness and status post surgery.  Transition of care to discern level of care.  Dispo: The patient is from: Home               Anticipated d/c is to: SNF versus home with home health              Anticipated d/c date is: 2 to 3 days              Patient currently is not medically stable to d/c due to hypotension, abdominal distention.  Consultants: Orthopedics  Procedures:   Right hip Girdlestone procedure by orthopedics on 03/05/2020  Right leg splint 03/05/2020  Antibiotics:  . None  Anti-infectives (From admission, onward)   Start     Dose/Rate Route Frequency Ordered Stop   03/05/20 2100  ceFAZolin (ANCEF) IVPB 2g/100 mL premix     2 g 200 mL/hr over 30 Minutes Intravenous Every 8 hours 03/05/20 2043 03/06/20 0547   03/05/20 0818  vancomycin (VANCOCIN) powder  Status:  Discontinued       As needed 03/05/20 0819 03/05/20 0913   03/05/20 0600  ceFAZolin (ANCEF) IVPB 2g/100 mL premix  Status:  Discontinued     2 g 200 mL/hr over 30 Minutes Intravenous On call to O.R. 03/04/20 1713 03/05/20 1055     Subjective: Today, patient was seen and examined at bedside.  Complains of abdominal distention.  Nursing staff reported that the patient blood pressure was low.  Denies any dizziness or chest pain.  Objective: Vitals:   03/06/20 0748 03/06/20 0857  BP: (!) 81/38 (!) 102/44  Pulse: 84 87  Resp: 17   Temp: (!) 97.3 F (36.3 C)   SpO2: 93%     Intake/Output Summary (Last 24 hours) at 03/06/2020 1057 Last data filed at 03/06/2020 0300 Gross per 24 hour  Intake 1966.84 ml  Output 400 ml  Net 1566.84 ml   Filed Weights   03/03/20 0421 03/03/20 0838  Weight: 79.7 kg 79.7 kg   Body mass index is 32.14 kg/m.   Physical Exam:  GENERAL: Patient is alert awake and communicative, not in obvious distress.  Obese HENT: No scleral pallor or icterus. Pupils equally reactive to light. Oral mucosa is moist NECK: is supple, no gross swelling noted. CHEST: Clear to auscultation.  No crackles or wheezes.  Diminished breath sounds bilaterally. CVS: S1 and S2 heard, no murmur. Regular rate and rhythm.  ABDOMEN: Soft, non-tender, bowel sounds are present. EXTREMITIES: Bilateral lower extremity pitting edema on presentation. Status post right hip surgery with dressing. CNS: Cranial nerves are intact. No focal motor deficits. SKIN: warm, bilateral lower extremity bullae, some bulla with rupture.  Data Review: I have personally reviewed the following laboratory data and studies,  CBC: Recent Labs  Lab 03/02/20 2117 03/03/20 0402 03/04/20 0504 03/05/20 1524 03/06/20 0423  WBC 6.2 5.9 4.6 10.9* 10.2  NEUTROABS 4.1  --   --   --   --   HGB 11.0* 10.3* 9.6* 8.6* 8.1*  HCT 36.5 33.7* 31.9* 28.1* 27.0*  MCV 99.7 98.8 100.0 98.9 100.0  PLT 201 183 162 139* 136*   Basic  Metabolic Panel: Recent Labs  Lab 03/02/20 2117 03/03/20 0402 03/04/20 0504 03/05/20 1524 03/06/20 0423  NA 137 143 143 140 142  K 4.3 4.0 4.3 4.8 4.9  CL 111 117* 117* 113* 115*  CO2 17* 19* 22 23 21*  GLUCOSE 176* 147* 121* 313* 249*  BUN 32* 31* 29* 31* 32*  CREATININE 1.40* 1.30* 1.36* 1.35* 1.45*  CALCIUM 7.6* 7.7* 7.6* 7.6* 7.5*  MG  --   --  1.9 2.2  --    Liver Function Tests: Recent Labs  Lab 03/02/20 2117 03/03/20 0402 03/04/20 0504  AST 27 27 35  ALT 18 16 17   ALKPHOS 321* 282* 260*  BILITOT 0.8 0.4 0.2*  PROT 6.0* 5.4* 5.0*  ALBUMIN 1.8* 1.6* 1.4*   No results for input(s): LIPASE, AMYLASE in the last 168 hours. No results for input(s): AMMONIA in the last 168 hours. Cardiac Enzymes: No results for input(s): CKTOTAL, CKMB, CKMBINDEX, TROPONINI in the last 168 hours. BNP (last 3 results) Recent Labs    03/03/20 0402  BNP 74.9    ProBNP (last 3 results) No results for input(s): PROBNP in the last 8760 hours.  CBG: Recent Labs  Lab 03/05/20 0923 03/05/20 1142 03/05/20 1641 03/05/20 2031 03/06/20 0743  GLUCAP 214* 241* 299* 287* 205*   Recent Results  (from the past 240 hour(s))  Respiratory Panel by RT PCR (Flu A&B, Covid) - Urine, Catheterized     Status: None   Collection Time: 03/02/20 11:41 PM   Specimen: Urine, Catheterized  Result Value Ref Range Status   SARS Coronavirus 2 by RT PCR NEGATIVE NEGATIVE Final    Comment: (NOTE) SARS-CoV-2 target nucleic acids are NOT DETECTED. The SARS-CoV-2 RNA is generally detectable in upper respiratoy specimens during the acute phase of infection. The lowest concentration of SARS-CoV-2 viral copies this assay can detect is 131 copies/mL. A negative result does not preclude SARS-Cov-2 infection and should not be used as the sole basis for treatment or other patient management decisions. A negative result may occur with  improper specimen collection/handling, submission of specimen other than nasopharyngeal swab, presence of viral mutation(s) within the areas targeted by this assay, and inadequate number of viral copies (<131 copies/mL). A negative result must be combined with clinical observations, patient history, and epidemiological information. The expected result is Negative. Fact Sheet for Patients:  PinkCheek.be Fact Sheet for Healthcare Providers:  GravelBags.it This test is not yet ap proved or cleared by the [place] FDA and  has been authorized for detection and/or diagnosis of SARS-CoV-2 by FDA under an Emergency Use Authorization (EUA). This EUA will remain  in effect (meaning this test can be used) for the duration of the COVID-19 declaration under Section 564(b)(1) of the Act, 21 U.S.C. section 360bbb-3(b)(1), unless the authorization is terminated or revoked sooner.    Influenza A by PCR NEGATIVE NEGATIVE Final   Influenza B by PCR NEGATIVE NEGATIVE Final    Comment: (NOTE) The Xpert Xpress SARS-CoV-2/FLU/RSV assay is intended as an aid in  the diagnosis of influenza from Nasopharyngeal swab specimens and    should not be used as a sole basis for treatment. Nasal washings and  aspirates are unacceptable for Xpert Xpress SARS-CoV-2/FLU/RSV  testing. Fact Sheet for Patients: PinkCheek.be Fact Sheet for Healthcare Providers: GravelBags.it This test is not yet approved or cleared by the [place] FDA and  has been authorized for detection and/or diagnosis of SARS-CoV-2 by  FDA under an Emergency Use Authorization (EUA). This  EUA will remain  in effect (meaning this test can be used) for the duration of the  Covid-19 declaration under Section 564(b)(1) of the Act, 21  U.S.C. section 360bbb-3(b)(1), unless the authorization is  terminated or revoked. Performed at [facility], [place] [address], [place], [place] [address]   Surgical pcr screen     Status: None   Collection Time: 03/04/20  5:26 PM   Specimen: Nasal Mucosa; Nasal Swab  Result Value Ref Range Status   MRSA, PCR NEGATIVE NEGATIVE Final   Staphylococcus aureus NEGATIVE NEGATIVE Final    Comment: (NOTE) The Xpert SA Assay (FDA approved for NASAL specimens in patients 68 years of age and older), is one component of a comprehensive surveillance program. It is not intended to diagnose infection nor to guide or monitor treatment. Performed at [facility], [place] [address], [place], [place] [address]      Studies: Pelvis Portable  Result Date: 03/05/2020 CLINICAL DATA:  Nonunion of right femoral neck fracture. EXAM: PORTABLE PELVIS 1-2 VIEWS COMPARISON:  None. FINDINGS: A subcapital right hip fracture is again identified. The patient is status post right hip replacement. There is cement in the region of the left acetabulum consistent with part of the left hip replacement. No other changes. IMPRESSION: Continued subcapital right hip fracture. Electronically Signed   By: Dorise Bullion III M.D   On: 03/05/2020 13:54   DG Tibia/Fibula Right Port  Result Date:  03/05/2020 CLINICAL DATA:  Evaluate for fracture. EXAM: PORTABLE RIGHT TIBIA AND FIBULA - 2 VIEW COMPARISON:  None. FINDINGS: The patient is within a cast. Nondisplaced fractures of the distal tibia and fibula are identified. No other abnormalities. IMPRESSION: Nondisplaced fractures of the distal tibia and fibula. The patient is in a cast. Electronically Signed   By: Dorise Bullion III M.D   On: 03/05/2020 14:25   DG C-Arm 1-60 Min  Result Date: 03/05/2020 CLINICAL DATA:  Right hip girdle stone procedure. EXAM: DG C-ARM 1-60 MIN FLUOROSCOPY TIME:  Fluoroscopy Time:  8 seconds Number of Acquired Spot Images: 2 COMPARISON:  March 02, 2020 FINDINGS: Two images were obtained of the right hip fluoroscopic Lea. There appears to be air in the soft tissues at the end of the study but no surgical hardware. The known right hip fracture is again identified. IMPRESSION: Two fluoroscopic images were obtained. Persistent right hip fracture. Electronically Signed   By: Dorise Bullion III M.D   On: 03/05/2020 13:52   DG HIP OPERATIVE UNILAT W OR W/O PELVIS RIGHT  Result Date: 03/05/2020 CLINICAL DATA:  Right hip girdle stone procedure FLUOROSCOPY TIME:  8 seconds. Images: 2 EXAM: OPERATIVE RIGHT HIP (WITH PELVIS IF PERFORMED) 2 VIEWS TECHNIQUE: Fluoroscopic spot image(s) were submitted for interpretation post-operatively. COMPARISON:  None. FINDINGS: Two fluoroscopic images were obtained of the known right hip fracture. There appears to be air in the soft tissues but no surgical hardware by the end of the study. IMPRESSION: Fluoroscopic images of the known right hip fracture. Electronically Signed   By: Dorise Bullion III M.D   On: 03/05/2020 13:54     Flora Lipps, MD  [facility] 03/06/2020

## 2020-03-06 NOTE — Care Management (Cosign Needed)
    Durable Medical Equipment  (From admission, onward)         Start     Ordered   03/06/20 1648  For home use only DME lightweight manual wheelchair with seat cushion  Once    Comments: Patient suffers from S/P fracture of femoral head which impairs their ability to perform daily activities like [id number] in the home.  A walking [id number] will not resolve  issue with performing activities of daily living. A wheelchair will allow patient to safely perform daily activities. Patient is not able to propel themselves in the home using a standard weight wheelchair due to weakness:[id number]. Patient can self propel in the lightweight wheelchair. Length of need Timeframe:22503. Accessories: elevating leg rests (ELRs), wheel locks, extensions and anti-tippers.   03/06/20 1648   03/06/20 1647  For home use only DME Hospital bed  Once    Question Answer Comment  Length of Need 6 Months   The above medical condition requires: Patient requires the ability to reposition frequently   Bed type Semi-electric   [facility] Lift Yes   Trapeze Bar Yes   Support Surface: Gel Overlay      03/06/20 1648   03/06/20 1646  For home use only DME 3 n 1  Once     03/06/20 1648

## 2020-03-07 LAB — BASIC METABOLIC PANEL
Anion gap: 5 (ref 5–15)
Anion gap: 4 — ABNORMAL LOW (ref 5–15)
BUN: 37 mg/dL — ABNORMAL HIGH (ref 8–23)
BUN: 37 mg/dL — ABNORMAL HIGH (ref 8–23)
CO2: 21 mmol/L — ABNORMAL LOW (ref 22–32)
CO2: 17 mmol/L — ABNORMAL LOW (ref 22–32)
Calcium: 7.3 mg/dL — ABNORMAL LOW (ref 8.9–10.3)
Calcium: 7.4 mg/dL — ABNORMAL LOW (ref 8.9–10.3)
Chloride: 115 mmol/L — ABNORMAL HIGH (ref 98–111)
Chloride: 120 mmol/L — ABNORMAL HIGH (ref 98–111)
Creatinine, Ser: 1.66 mg/dL — ABNORMAL HIGH (ref 0.44–1.00)
Creatinine, Ser: 1.69 mg/dL — ABNORMAL HIGH (ref 0.44–1.00)
GFR calc Af Amer: 33 mL/min — ABNORMAL LOW (ref >60)
GFR calc Af Amer: 32 mL/min — ABNORMAL LOW (ref >60)
GFR calc non Af Amer: 28 mL/min — ABNORMAL LOW (ref >60)
GFR calc non Af Amer: 28 mL/min — ABNORMAL LOW (ref >60)
Glucose, Bld: 85 mg/dL (ref 70–99)
Glucose, Bld: 96 mg/dL (ref 70–99)
Potassium: 6 mmol/L — ABNORMAL HIGH (ref 3.5–5.1)
Potassium: 6.4 mmol/L (ref 3.5–5.1)
Sodium: 141 mmol/L (ref 135–145)
Sodium: 141 mmol/L (ref 135–145)

## 2020-03-07 LAB — GLUCOSE, CAPILLARY
Glucose-Capillary: 77 mg/dL (ref 70–99)
Glucose-Capillary: 162 mg/dL — ABNORMAL HIGH (ref 70–99)
Glucose-Capillary: 83 mg/dL (ref 70–99)
Glucose-Capillary: 86 mg/dL (ref 70–99)

## 2020-03-07 LAB — CBC
HCT: 23 % — ABNORMAL LOW (ref 36.0–46.0)
Hemoglobin: 6.9 g/dL — CL (ref 12.0–15.0)
MCH: 30.4 pg (ref 26.0–34.0)
MCHC: 30 g/dL (ref 30.0–36.0)
MCV: 101.3 fL — ABNORMAL HIGH (ref 80.0–100.0)
Platelets: 132 10*3/uL — ABNORMAL LOW (ref 150–400)
RBC: 2.27 MIL/uL — ABNORMAL LOW (ref 3.87–5.11)
RDW: 15.8 % — ABNORMAL HIGH (ref 11.5–15.5)
WBC: 7.4 10*3/uL (ref 4.0–10.5)
nRBC: 0 % (ref 0.0–0.2)

## 2020-03-07 LAB — PREPARE RBC (CROSSMATCH)

## 2020-03-07 LAB — POTASSIUM: Potassium: 6 mmol/L — ABNORMAL HIGH (ref 3.5–5.1)

## 2020-03-07 LAB — MAGNESIUM: Magnesium: 2.3 mg/dL (ref 1.7–2.4)

## 2020-03-07 LAB — ABO/RH: ABO/RH(D): A POS

## 2020-03-07 MED ORDER — INSULIN ASPART 100 UNIT/ML IV SOLN
10.0000 [IU] | Freq: Once | INTRAVENOUS | Status: AC
  Administered 2020-03-07: 10 [IU] via INTRAVENOUS

## 2020-03-07 MED ORDER — DEXTROSE 50 % IV SOLN
50.0000 mL | Freq: Once | INTRAVENOUS | Status: AC
  Administered 2020-03-07: 50 mL via INTRAVENOUS
  Filled 2020-03-07: qty 50

## 2020-03-07 MED ORDER — SODIUM POLYSTYRENE SULFONATE 15 GM/60ML PO SUSP
15.0000 g | Freq: Once | ORAL | Status: AC
  Administered 2020-03-07: 15 g via ORAL
  Filled 2020-03-07: qty 60

## 2020-03-07 MED ORDER — SODIUM CHLORIDE 0.9% IV SOLUTION: Freq: Once | INTRAVENOUS | Status: AC

## 2020-03-07 MED ORDER — SODIUM CHLORIDE (PF) 0.9 % IJ SOLN: 10.0000 [IU] | Freq: Once | INTRAMUSCULAR | Status: DC

## 2020-03-07 MED ORDER — CALCIUM GLUCONATE-NACL 1-0.675 GM/50ML-% IV SOLN
1.0000 g | Freq: Once | INTRAVENOUS | Status: AC
  Administered 2020-03-07: 1000 mg via INTRAVENOUS
  Filled 2020-03-07 (×2): qty 50

## 2020-03-07 MED ORDER — ENOXAPARIN SODIUM 30 MG/0.3ML ~~LOC~~ SOLN: 30.0000 mg | SUBCUTANEOUS | Status: DC

## 2020-03-07 NOTE — Plan of Care (Signed)
  Problem: Education: Goal: Verbalization of understanding the information provided (i.e., activity precautions, restrictions, etc) will improve Outcome: Progressing   Problem: Activity: Goal: Ability to ambulate and perform ADLs will improve Outcome: Progressing   Problem: Clinical Measurements: Goal: Postoperative complications will be avoided or minimized Outcome: Progressing   Problem: Pain Management: Goal: Pain level will decrease Outcome: Progressing   

## 2020-03-07 NOTE — Progress Notes (Signed)
Lab notified nurse of hemoglobin of 6.9   MD on call notified   Type and Screen ordered as well as 1 unit of blood

## 2020-03-07 NOTE — Progress Notes (Signed)
Patient seemed somewhat confused this a.m., attempted to use translator but translator could not understand what she was saying, and stated that she seemed confused.  Obtained a bladder scan which showed 546 mL of urine in the bladder. MD on Call notified.  In & Out cath performed by Blanch Media, RN with 1000 mL output.  Patient communicating at baseline with RN despite communication barrier.  Will continue to monitor output

## 2020-03-07 NOTE — Progress Notes (Signed)
CRITICAL VALUE ALERT  Critical Value:  K=6.4  Date & Time Notied:  0949  Provider Notified: Dr. Corrie Mckusick Pokhrel  Orders Received/Actions taken: Per MD he will put the orders in.

## 2020-03-07 NOTE — Progress Notes (Addendum)
PROGRESS NOTE  Whitney [name] VOZ:[id number] DOB: Apr 30, 1935 DOA: 03/02/2020 PCP: Patient, No Pcp Per   LOS: 4 days   Brief narrative: As per HPI,  Whitney [name] is a 85 y.o. female with medical history significant for insulin-dependent diabetes mellitus, chronic kidney disease stage III, and right hip fracture more than a month ago, presented to the hospital with increasing right hip pain. Patient previously lived in [place] but had since been in [place] where she suffered a right hip fracture more than a month ago.  She was seen in a hospital in [place] where she was treated with some pain medications and Lovenox before flying to the [place].  She was briefly admitted to a hospital in [place] several days ago where she was seen by orthopedic surgery and discharged home with plans for outpatient follow-up.  She has since come to [place], continues to use oxycodone and gabapentin, but has been experiencing increasing pain in the right hip.    ED Course: Upon arrival to the ED, patient is found to be afebrile, saturating mid 90s on room air, and with stable blood pressure.  EKG showed sinus rhythm.  Radiographs of the right hip demonstrate right femoral neck fracture with varus angulation.  Chemistry panel was notable for albumin 1.8 and creatinine 1.40, similar to priors.  Orthopedic surgery was consulted by the ED physician, given IV fentanyl in the ED but continued to complain of severe pain so hospitalists were asked to admit.  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 Unable to  walk since 2011 and was on wheelchair.  Patient presented  with increasing pain. She had been taking oxycodone and gabapentin at home.  Radiographs with right femoral neck fracture with varus angulation. Orthopedic surgery was consulted and patient underwent right hip Girdlestone procedure on 03/04/20.   Continue analgesia, gabapentin, and IV narcotics as necessary.  Orthopedic recommends return in 2 weeks for suture removal in 6 weeks as follow-up.  Patient does have mild to moderate pain.  Orthopedic has seen the patient today and recommend continuation of the splint and elevation to right lower extremity.  Nonweightbearing to the right lower extremity.  Significant anemia.  Hemoglobin of 6.9 today.  Hemoglobin of 9.6 three days back.  Status post hip surgery.  Possibility of postoperative blood loss anemia.  No active bleeding noted.  Orthopedics on board.  We will hold the Lovenox for today.  Check stool occult blood.  Transfuse total of II unit of blood today.  Check CBC in a.m.Marland Kitchen  Hypotension received IV fluids.  Received 1 dose of IV albumin yesterday.  Will be receiving PRBC 1 unit today.  Could consider more albumin due to hypoalbuminemia and continue PRBC transfusion.  Will closely monitor blood pressure.  Patient mentating well.  No chest pain shortness of breath.  Hyperkalemia at 6.4.  Will give temporizing measures.  Continue calcium gluconate, IV insulin and IV dextrose.  Check potassium levels in p.m.  Magnesium of 2.3.  We will give 1 dose of Kayexalate today.   CKD IIIb  Creatinine of 1.4 at baseline.  Creatinine today at 1.6.  Check BMP in a.m.Marland Kitchen  Type II DM , insulin-dependent Hemoglobin A1c was 5.4% in April 2021.  Continue long-acting short acting insulin.   Last POC glucose was 77.  Bilateral lower Edema   Patient had bilateral pitting LE edema with bullae and serous weeping on presentation.  Likely third spacing secondary to hypoalbuminemia. Likely secondary to stasis and  hypoalbuminemia from malnutrition.  Does not have history of congestive heart failure.   Patient is mostly bedbound on a wheelchair at baseline.  BNP within normal limits.  2D echocardiogram with preserved LV function.  Will need to encourage nutrition.  Protein calorie malnutrition, obesity unspecified.  Present on admission.  Nutritional services on board.  We will continue nutritional supplements. On ensure.  Does have severe hypoalbuminemia.  Constipation.  Had bowel movement yesterday.  Continue  On MiraLax, stool softeners .  Feels better with abdominal distention today.  Continue Dulcolax suppositories daily until bowel movements are regulated.   VTE Prophylaxis:  lovenox subq, hold for today due to significant anemia, consider in am if hemoglobin remains stable.   Code Status: Full code  Family Communication:   I spoke with the patient's daughter Ms Caro Hight and updated her about the clinical condition of the patient.   Status is: Inpatient  The patient will require inpatient because:  Inpatient level of care appropriate due to severity of illness and status post surgery with hypotension, hyperkalemia, anemia  Dispo: The patient is from: Home               Anticipated d/c is to: Home with home health.  TOC on board.              Anticipated d/c date is: 2 days or so              Patient currently is not medically stable to d/c due to hypotension, new anemia- postoperative, hypokalemia.  Consultants: Orthopedics  Procedures:   Right hip Girdlestone procedure by orthopedics on 03/05/2020  Right leg splint 03/05/2020  Transfusion of packed RBC.  Antibiotics:  . None  Anti-infectives (From admission, onward)   Start     Dose/Rate Route Frequency Ordered Stop   03/05/20 2100  ceFAZolin (ANCEF) IVPB 2g/100 mL premix     2 g 200 mL/hr over 30 Minutes Intravenous Every 8 hours 03/05/20 2043 03/06/20 0547   03/05/20 0818  vancomycin (VANCOCIN) powder  Status:  Discontinued        As needed 03/05/20 0819 03/05/20 0913   03/05/20 0600  ceFAZolin (ANCEF) IVPB 2g/100 mL premix  Status:  Discontinued     2 g 200 mL/hr over 30 Minutes Intravenous On call to O.R. 03/04/20 1713 03/05/20 1055     Subjective: Today, patient was seen and examined at bedside.  Spoke with the help of video interpreter. Patient  denies abdominal distention or pain.  Had a bowel movement yesterday.  Had a lot of hip pain yesterday but denies much pain today.  Patient's daughter states that that she is confused today.  Objective: Vitals:   03/07/20 0319 03/07/20 0859  BP: (!) 91/47 (!) 95/43  Pulse: 86 97  Resp: 16   Temp: (!) 97.5 F (36.4 C) 97.8 F (36.6 C)  SpO2: 100% 100%    Intake/Output Summary (Last 24 hours) at 03/07/2020 1113 Last data filed at 03/07/2020 0500 Gross per 24 hour  Intake 459.9 ml  Output 1000 ml  Net -540.1 ml   [employer]  03/03/20 0421 03/03/20 0838  Weight: 79.7 kg 79.7 kg   Body mass index is 32.14 kg/m.   Physical Exam:  GENERAL: Patient is alert awake and communicative, not in obvious distress.  Obese HENT: No scleral pallor or icterus. Pupils equally reactive to light. Oral mucosa is moist NECK: is supple, no gross swelling noted. CHEST: Clear to auscultation. No crackles or wheezes.  Diminished breath sounds bilaterally. CVS: S1 and S2 heard, no murmur. Regular rate and rhythm.  ABDOMEN: Soft, non-tender, bowel sounds are present. EXTREMITIES: Bilateral lower extremity pitting edema on presentation. Status post right hip surgery with dressing.  Right lower extremity in a splint. CNS: Cranial nerves are intact.  Moves upper extremities.  Wiggling of the toes on lower extremity SKIN: warm, bilateral lower extremity edema bulla seen.  Data Review: I have personally reviewed the following laboratory data and studies,  CBC: Recent Labs  Lab 03/02/20 2117 03/02/20 2117 03/03/20 0402 03/04/20 0504 03/05/20 1524 03/06/20 0423 03/07/20 0425    WBC 6.2   < > 5.9 4.6 10.9* 10.2 7.4  NEUTROABS 4.1  --   --   --   --   --   --   HGB 11.0*   < > 10.3* 9.6* 8.6* 8.1* 6.9*  HCT 36.5   < > 33.7* 31.9* 28.1* 27.0* 23.0*  MCV 99.7   < > 98.8 100.0 98.9 100.0 101.3*  PLT 201   < > 183 162 139* 136* 132*   < > = values in this interval not displayed.   Basic Metabolic Panel: Recent Labs  Lab 03/04/20 0504 03/05/20 1524 03/06/20 0423 03/07/20 0425 03/07/20 0746  NA 143 140 142 141 141  K 4.3 4.8 4.9 6.0* 6.4*  CL 117* 113* 115* 115* 120*  CO2 22 23 21* 21* 17*  GLUCOSE 121* 313* 249* 85 96  BUN 29* 31* 32* 37* 37*  CREATININE 1.36* 1.35* 1.45* 1.66* 1.69*  CALCIUM 7.6* 7.6* 7.5* 7.3* 7.4*  MG 1.9 2.2  --  2.3  --    Liver Function Tests: Recent Labs  Lab 03/02/20 2117 03/03/20 0402 03/04/20 0504  AST 27 27 35  ALT 18 16 17   ALKPHOS 321* 282* 260*  BILITOT 0.8 0.4 0.2*  PROT 6.0* 5.4* 5.0*  ALBUMIN 1.8* 1.6* 1.4*   No results for input(s): LIPASE, AMYLASE in the last 168 hours. No results for input(s): AMMONIA in the last 168 hours. Cardiac Enzymes: No results for input(s): CKTOTAL, CKMB, CKMBINDEX, TROPONINI in the last 168 hours. BNP (last 3 results) Recent Labs    03/03/20 0402  BNP 74.9    ProBNP (last 3 results) No results for input(s): PROBNP in the last 8760 hours.  CBG: Recent Labs  Lab 03/06/20 0743 03/06/20 1127 03/06/20 1637 03/06/20 2030 03/07/20 0913  GLUCAP 205* 167* 122* 138* 77   Recent Results (from the past 240 hour(s))  Respiratory Panel by RT PCR (Flu A&B, Covid) - Urine, Catheterized     Status: None   Collection Time: 03/02/20 11:41 PM   Specimen: Urine, Catheterized  Result Value Ref Range Status   SARS Coronavirus 2 by RT PCR NEGATIVE NEGATIVE Final    Comment: (NOTE) SARS-CoV-2 target nucleic acids are NOT DETECTED. The SARS-CoV-2 RNA is generally detectable in upper respiratoy specimens during the acute phase of infection. The lowest concentration of SARS-CoV-2 viral  copies this assay can detect is 131 copies/mL. A negative result does not preclude SARS-Cov-2 infection and should not be used as  the sole basis for treatment or other patient management decisions. A negative result may occur with  improper specimen collection/handling, submission of specimen other than nasopharyngeal swab, presence of viral mutation(s) within the areas targeted by this assay, and inadequate number of viral copies (<131 copies/mL). A negative result must be combined with clinical observations, patient history, and epidemiological information. The expected result is Negative. Fact Sheet for Patients:  PinkCheek.be Fact Sheet for Healthcare Providers:  GravelBags.it This test is not yet ap proved or cleared by the [place] FDA and  has been authorized for detection and/or diagnosis of SARS-CoV-2 by FDA under an Emergency Use Authorization (EUA). This EUA will remain  in effect (meaning this test can be used) for the duration of the COVID-19 declaration under Section 564(b)(1) of the Act, 21 U.S.C. section 360bbb-3(b)(1), unless the authorization is terminated or revoked sooner.    Influenza A by PCR NEGATIVE NEGATIVE Final   Influenza B by PCR NEGATIVE NEGATIVE Final    Comment: (NOTE) The Xpert Xpress SARS-CoV-2/FLU/RSV assay is intended as an aid in  the diagnosis of influenza from Nasopharyngeal swab specimens and  should not be used as a sole basis for treatment. Nasal washings and  aspirates are unacceptable for Xpert Xpress SARS-CoV-2/FLU/RSV  testing. Fact Sheet for Patients: PinkCheek.be Fact Sheet for Healthcare Providers: GravelBags.it This test is not yet approved or cleared by the [place] FDA and  has been authorized for detection and/or diagnosis of SARS-CoV-2 by  FDA under an Emergency Use Authorization (EUA). This EUA will  remain  in effect (meaning this test can be used) for the duration of the  Covid-19 declaration under Section 564(b)(1) of the Act, 21  U.S.C. section 360bbb-3(b)(1), unless the authorization is  terminated or revoked. Performed at [facility], [place] [address] [place], [place] [address]   Surgical pcr screen     Status: None   Collection Time: 03/04/20  5:26 PM   Specimen: Nasal Mucosa; Nasal Swab  Result Value Ref Range Status   MRSA, PCR NEGATIVE NEGATIVE Final   Staphylococcus aureus NEGATIVE NEGATIVE Final    Comment: (NOTE) The Xpert SA Assay (FDA approved for NASAL specimens in patients 15 years of age and older), is one component of a comprehensive surveillance program. It is not intended to diagnose infection nor to guide or monitor treatment. Performed at [facility], [place] [address] [place], [place] [address]      Studies: No results found.   Flora Lipps, MD  [facility] 03/07/2020

## 2020-03-08 LAB — COMPREHENSIVE METABOLIC PANEL
ALT: 6 U/L (ref 0–44)
AST: 29 U/L (ref 15–41)
Albumin: 1.7 g/dL — ABNORMAL LOW (ref 3.5–5.0)
Alkaline Phosphatase: 206 U/L — ABNORMAL HIGH (ref 38–126)
Anion gap: 13 (ref 5–15)
BUN: 37 mg/dL — ABNORMAL HIGH (ref 8–23)
CO2: 14 mmol/L — ABNORMAL LOW (ref 22–32)
Calcium: 7.7 mg/dL — ABNORMAL LOW (ref 8.9–10.3)
Chloride: 115 mmol/L — ABNORMAL HIGH (ref 98–111)
Creatinine, Ser: 1.7 mg/dL — ABNORMAL HIGH (ref 0.44–1.00)
GFR calc Af Amer: 32 mL/min — ABNORMAL LOW (ref >60)
GFR calc non Af Amer: 28 mL/min — ABNORMAL LOW (ref >60)
Glucose, Bld: 104 mg/dL — ABNORMAL HIGH (ref 70–99)
Potassium: 5.7 mmol/L — ABNORMAL HIGH (ref 3.5–5.1)
Sodium: 142 mmol/L (ref 135–145)
Total Bilirubin: 1.1 mg/dL (ref 0.3–1.2)
Total Protein: 5.1 g/dL — ABNORMAL LOW (ref 6.5–8.1)

## 2020-03-08 LAB — GLUCOSE, CAPILLARY
Glucose-Capillary: 96 mg/dL (ref 70–99)
Glucose-Capillary: 140 mg/dL — ABNORMAL HIGH (ref 70–99)
Glucose-Capillary: 142 mg/dL — ABNORMAL HIGH (ref 70–99)
Glucose-Capillary: 146 mg/dL — ABNORMAL HIGH (ref 70–99)

## 2020-03-08 LAB — CBC
HCT: 35 % — ABNORMAL LOW (ref 36.0–46.0)
Hemoglobin: 11.1 g/dL — ABNORMAL LOW (ref 12.0–15.0)
MCH: 31 pg (ref 26.0–34.0)
MCHC: 31.7 g/dL (ref 30.0–36.0)
MCV: 97.8 fL (ref 80.0–100.0)
Platelets: 149 10*3/uL — ABNORMAL LOW (ref 150–400)
RBC: 3.58 MIL/uL — ABNORMAL LOW (ref 3.87–5.11)
RDW: 16 % — ABNORMAL HIGH (ref 11.5–15.5)
WBC: 10.6 10*3/uL — ABNORMAL HIGH (ref 4.0–10.5)
nRBC: 0 % (ref 0.0–0.2)

## 2020-03-08 LAB — BPAM RBC
Blood Product Expiration Date: 202105252359
Blood Product Expiration Date: 202105252359
ISSUE DATE / TIME: 202104271301
ISSUE DATE / TIME: 202104271706
Unit Type and Rh: 6200
Unit Type and Rh: 6200

## 2020-03-08 LAB — URINALYSIS, ROUTINE W REFLEX MICROSCOPIC
Bilirubin Urine: NEGATIVE
Glucose, UA: NEGATIVE mg/dL
Ketones, ur: NEGATIVE mg/dL
Leukocytes,Ua: NEGATIVE
Nitrite: NEGATIVE
Protein, ur: 100 mg/dL — AB
RBC / HPF: >50 RBC/hpf — ABNORMAL HIGH (ref 0–5)
Specific Gravity, Urine: 1.02 (ref 1.005–1.030)
pH: 5 (ref 5.0–8.0)

## 2020-03-08 LAB — TYPE AND SCREEN
ABO/RH(D): A POS
Antibody Screen: NEGATIVE
Unit division: 0
Unit division: 0

## 2020-03-08 LAB — MAGNESIUM: Magnesium: 2.4 mg/dL (ref 1.7–2.4)

## 2020-03-08 MED ORDER — ENSURE ENLIVE PO LIQD
237.0000 mL | Freq: Three times a day (TID) | ORAL | Status: DC
  Administered 2020-03-08 – 2020-03-11 (×9): 237 mL via ORAL

## 2020-03-08 MED ORDER — TAMSULOSIN HCL 0.4 MG PO CAPS
0.4000 mg | ORAL_CAPSULE | Freq: Every day | ORAL | Status: DC
  Administered 2020-03-08 – 2020-03-30 (×16): 0.4 mg via ORAL
  Filled 2020-03-08 (×24): qty 1

## 2020-03-08 MED ORDER — SODIUM ZIRCONIUM CYCLOSILICATE 5 G PO PACK
5.0000 g | PACK | Freq: Two times a day (BID) | ORAL | Status: AC
  Administered 2020-03-08 (×2): 5 g via ORAL
  Filled 2020-03-08 (×2): qty 1

## 2020-03-08 MED ORDER — CIPROFLOXACIN HCL 500 MG PO TABS
250.0000 mg | ORAL_TABLET | Freq: Two times a day (BID) | ORAL | Status: DC
  Administered 2020-03-08: 250 mg via ORAL
  Filled 2020-03-08 (×2): qty 1

## 2020-03-08 NOTE — Consult Note (Signed)
Urology Consult  Referring physician:M Mikhail  Reason for referral: retention  Chief Complaint: retention unable to pass catheter  History of Present Illness: Whitney [name] is a 85 y.o. female with medical history significant for insulin-dependent diabetes mellitus, chronic kidney disease stage III, and right hip fracture more than a month ago, presented to the hospital with increasing right hip pain. Patient previously lived in [place] but had since been in [place] where she suffered a right hip fracture more than a month ago.  She was seen in a hospital in [place] where she was treated with some pain medications and Lovenox before flying to the [place].  She was briefly admitted to a hospital in [place] several days ago where she was seen by orthopedic surgery and discharged home with plans for outpatient follow-up.  She has since come to [place], continues to use oxycodone and gabapentin, but has been experiencing increasing pain in the right hip.  Orthopedic surgery was consulted and patient underwent right hip Girdlestone procedure on 03/04/20, during the surgery the patient sustained a spiral fracture of her right distal tibia.  Unable to pass cath; scan for 571 mL Cr 1.7 No renal xray Patient has been circumcised years ago No other bladder surgery and no retention history       Past Medical History:  Diagnosis Date  . Diabetes mellitus without complication [facility])    Past Surgical History:  Procedure Laterality Date  . TOTAL HIP ARTHROPLASTY Right 03/05/2020   Procedure: HIP GIRDLE STONE;  Surgeon: Leandrew Koyanagi, MD;  Location: [facility];  Service: Orthopedics;  Laterality: Right;    Medications: I have reviewed the patient's current medications. Allergies: No Known Allergies  History reviewed. No pertinent family history. Social History:  reports that she has never smoked. She has never used smokeless tobacco. No history on file for alcohol and drug.  ROS: All  systems are reviewed and negative except as noted. Ret neative  Physical Exam:  Vital signs in last 24 hours: Temp:  [97.7 F (36.5 C)-98.4 F (36.9 C)] 98.4 F (36.9 C) (04/28 1333) Pulse Rate:  [89-102] 89 (04/28 1333) Resp:  [15-18] 15 (04/28 1333) BP: (106-131)/(44-59) 126/59 (04/28 1333) SpO2:  [92 %-99 %] 92 % (04/28 1333)  Cardiovascular: Skin warm; not flushed Respiratory: Breaths quiet; no shortness of breath Abdomen: No masses Neurological: Normal sensation to touch Musculoskeletal: Normal motor function arms and legs Lymphatics: No inguinal adenopathy Skin: No rashes Genitourinary:grade two cystocele and I could see meatus  Laboratory Data:  Results for orders placed or performed during the hospital encounter of 03/02/20 (from the past 72 hour(s))  Glucose, capillary     Status: Abnormal   Collection Time: 03/05/20  8:31 PM  Result Value Ref Range   Glucose-Capillary 287 (H) 70 - 99 mg/dL    Comment: Glucose reference range applies only to samples taken after fasting for at least 8 hours.   Comment 1 Document in Chart   CBC     Status: Abnormal   Collection Time: 03/06/20  4:23 AM  Result Value Ref Range   WBC 10.2 4.0 - 10.5 K/uL   RBC 2.70 (L) 3.87 - 5.11 MIL/uL   Hemoglobin 8.1 (L) 12.0 - 15.0 g/dL   HCT 27.0 (L) 36.0 - 46.0 %   MCV 100.0 80.0 - 100.0 fL   MCH 30.0 26.0 - 34.0 pg   MCHC 30.0 30.0 - 36.0 g/dL   RDW 15.6 (H) 11.5 - 15.5 %  Platelets 136 (L) 150 - 400 K/uL   nRBC 0.0 0.0 - 0.2 %    Comment: Performed at [facility], [place] [address] [place], [place] [address]  Basic metabolic panel     Status: Abnormal   Collection Time: 03/06/20  4:23 AM  Result Value Ref Range   Sodium 142 135 - 145 mmol/L   Potassium 4.9 3.5 - 5.1 mmol/L   Chloride 115 (H) 98 - 111 mmol/L   CO2 21 (L) 22 - 32 mmol/L   Glucose, Bld 249 (H) 70 - 99 mg/dL    Comment: Glucose reference range applies only to samples taken after fasting for at least 8 hours.   BUN  32 (H) 8 - 23 mg/dL   Creatinine, Ser 1.45 (H) 0.44 - 1.00 mg/dL   Calcium 7.5 (L) 8.9 - 10.3 mg/dL   GFR calc non Af Amer 33 (L) >60 mL/min   GFR calc Af Amer 39 (L) >60 mL/min   Anion gap 6 5 - 15    Comment: Performed at [facility] [address], [place], [place] [address]  Glucose, capillary     Status: Abnormal   Collection Time: 03/06/20  7:43 AM  Result Value Ref Range   Glucose-Capillary 205 (H) 70 - 99 mg/dL    Comment: Glucose reference range applies only to samples taken after fasting for at least 8 hours.  Glucose, capillary     Status: Abnormal   Collection Time: 03/06/20 11:27 AM  Result Value Ref Range   Glucose-Capillary 167 (H) 70 - 99 mg/dL    Comment: Glucose reference range applies only to samples taken after fasting for at least 8 hours.  Glucose, capillary     Status: Abnormal   Collection Time: 03/06/20  4:37 PM  Result Value Ref Range   Glucose-Capillary 122 (H) 70 - 99 mg/dL    Comment: Glucose reference range applies only to samples taken after fasting for at least 8 hours.  Glucose, capillary     Status: Abnormal   Collection Time: 03/06/20  8:30 PM  Result Value Ref Range   Glucose-Capillary 138 (H) 70 - 99 mg/dL    Comment: Glucose reference range applies only to samples taken after fasting for at least 8 hours.  CBC     Status: Abnormal   Collection Time: 03/07/20  4:25 AM  Result Value Ref Range   WBC 7.4 4.0 - 10.5 K/uL   RBC 2.27 (L) 3.87 - 5.11 MIL/uL   Hemoglobin 6.9 (LL) 12.0 - 15.0 g/dL    Comment: REPEATED TO VERIFY THIS CRITICAL RESULT HAS VERIFIED AND BEEN CALLED TO R.WATLINGTON,RN BY MELISSA BROGDON ON 04 27 2021 AT 0521, AND HAS BEEN READ BACK.     HCT 23.0 (L) 36.0 - 46.0 %   MCV 101.3 (H) 80.0 - 100.0 fL   MCH 30.4 26.0 - 34.0 pg   MCHC 30.0 30.0 - 36.0 g/dL   RDW 15.8 (H) 11.5 - 15.5 %   Platelets 132 (L) 150 - 400 K/uL   nRBC 0.0 0.0 - 0.2 %    Comment: Performed at [facility] [address], [place],  [place] [address]  Basic metabolic panel     Status: Abnormal   Collection Time: 03/07/20  4:25 AM  Result Value Ref Range   Sodium 141 135 - 145 mmol/L   Potassium 6.0 (H) 3.5 - 5.1 mmol/L   Chloride 115 (H) 98 - 111 mmol/L  CO2 21 (L) 22 - 32 mmol/L   Glucose, Bld 85 70 - 99 mg/dL    Comment: Glucose reference range applies only to samples taken after fasting for at least 8 hours.   BUN 37 (H) 8 - 23 mg/dL   Creatinine, Ser 1.66 (H) 0.44 - 1.00 mg/dL   Calcium 7.3 (L) 8.9 - 10.3 mg/dL   GFR calc non Af Amer 28 (L) >60 mL/min   GFR calc Af Amer 33 (L) >60 mL/min   Anion gap 5 5 - 15    Comment: Performed at [facility] [address], [place], [place] [address]  Magnesium     Status: None   Collection Time: 03/07/20  4:25 AM  Result Value Ref Range   Magnesium 2.3 1.7 - 2.4 mg/dL    Comment: Performed at [facility] [address] [place], [place] [address]  Type and screen [place]     Status: None   Collection Time: 03/07/20  6:14 AM  Result Value Ref Range   ABO/RH(D) A POS    Antibody Screen NEG    Sample Expiration 03/10/2020,2359    Unit Number [id number]    Blood Component Type RED CELLS,LR    Unit division 00    Status of Unit ISSUED,FINAL    Transfusion Status OK TO TRANSFUSE    Crossmatch Result      Compatible Performed at [facility], [place] [address] [place], [place] [address]    Unit Number [id number]    Blood Component Type RED CELLS,LR    Unit division 00    Status of Unit ISSUED,FINAL    Transfusion Status OK TO TRANSFUSE    Crossmatch Result Compatible   Prepare RBC (crossmatch)     Status: None   Collection Time: 03/07/20  6:14 AM  Result Value Ref Range   Order Confirmation      ORDER PROCESSED BY BLOOD BANK Performed at [facility], [place] [address] [place], [place] [address]   ABO/Rh     Status: None   Collection Time: 03/07/20  6:14 AM  Result Value Ref Range   ABO/RH(D)      A POS Performed  at [facility] [address] [place],  [address]   Basic metabolic panel     Status: Abnormal   Collection Time: 03/07/20  7:46 AM  Result Value Ref Range   Sodium 141 135 - 145 mmol/L   Potassium 6.4 (HH) 3.5 - 5.1 mmol/L    Comment: CRITICAL RESULT CALLED TO, READ BACK BY AND VERIFIED WITH: A Trinna Balloon 03/07/2020 0951 WILDERK    Chloride 120 (H) 98 - 111 mmol/L   CO2 17 (L) 22 - 32 mmol/L   Glucose, Bld 96 70 - 99 mg/dL    Comment: Glucose reference range applies only to samples taken after fasting for at least 8 hours.   BUN 37 (H) 8 - 23 mg/dL   Creatinine, Ser 1.69 (H) 0.44 - 1.00 mg/dL   Calcium 7.4 (L) 8.9 - 10.3 mg/dL   GFR calc non Af Amer 28 (L) >60 mL/min   GFR calc Af Amer 32 (L) >60 mL/min   Anion gap 4 (L) 5 - 15    Comment: Performed at [facility] [address] [place], [place] [address]  Glucose, capillary     Status: None   Collection Time: 03/07/20  9:13 AM  Result Value Ref Range  Glucose-Capillary 77 70 - 99 mg/dL    Comment: Glucose reference range applies only to samples taken after fasting for at least 8 hours.  Glucose, capillary     Status: Abnormal   Collection Time: 03/07/20 11:52 AM  Result Value Ref Range   Glucose-Capillary 162 (H) 70 - 99 mg/dL    Comment: Glucose reference range applies only to samples taken after fasting for at least 8 hours.  Prepare RBC (crossmatch)     Status: None   Collection Time: 03/07/20 12:42 PM  Result Value Ref Range   Order Confirmation      ORDER PROCESSED BY BLOOD BANK Performed at [facility], [address], [place], [place] [address]   Glucose, capillary     Status: None   Collection Time: 03/07/20  5:27 PM  Result Value Ref Range   Glucose-Capillary 83 70 - 99 mg/dL    Comment: Glucose reference range applies only to samples taken after fasting for at least 8 hours.  Glucose, capillary     Status: None   Collection Time: 03/07/20  8:52 PM  Result Value Ref Range    Glucose-Capillary 86 70 - 99 mg/dL    Comment: Glucose reference range applies only to samples taken after fasting for at least 8 hours.  Potassium     Status: Abnormal   Collection Time: 03/07/20 10:04 PM  Result Value Ref Range   Potassium 6.0 (H) 3.5 - 5.1 mmol/L    Comment: Performed at [facility], [place] [address], [place], [place] [address]  CBC     Status: Abnormal   Collection Time: 03/08/20  1:54 AM  Result Value Ref Range   WBC 10.6 (H) 4.0 - 10.5 K/uL   RBC 3.58 (L) 3.87 - 5.11 MIL/uL   Hemoglobin 11.1 (L) 12.0 - 15.0 g/dL    Comment: REPEATED TO VERIFY POST TRANSFUSION SPECIMEN DELTA CHECK NOTED    HCT 35.0 (L) 36.0 - 46.0 %   MCV 97.8 80.0 - 100.0 fL   MCH 31.0 26.0 - 34.0 pg   MCHC 31.7 30.0 - 36.0 g/dL   RDW 16.0 (H) 11.5 - 15.5 %   Platelets 149 (L) 150 - 400 K/uL   nRBC 0.0 0.0 - 0.2 %    Comment: Performed at [facility], [place] [address], [place], [place] [address]  Comprehensive metabolic panel     Status: Abnormal   Collection Time: 03/08/20  1:54 AM  Result Value Ref Range   Sodium 142 135 - 145 mmol/L   Potassium 5.7 (H) 3.5 - 5.1 mmol/L   Chloride 115 (H) 98 - 111 mmol/L   CO2 14 (L) 22 - 32 mmol/L   Glucose, Bld 104 (H) 70 - 99 mg/dL    Comment: Glucose reference range applies only to samples taken after fasting for at least 8 hours.   BUN 37 (H) 8 - 23 mg/dL   Creatinine, Ser 1.70 (H) 0.44 - 1.00 mg/dL   Calcium 7.7 (L) 8.9 - 10.3 mg/dL   Total Protein 5.1 (L) 6.5 - 8.1 g/dL   Albumin 1.7 (L) 3.5 - 5.0 g/dL   AST 29 15 - 41 U/L   ALT 6 0 - 44 U/L   Alkaline Phosphatase 206 (H) 38 - 126 U/L   Total Bilirubin 1.1 0.3 - 1.2 mg/dL   GFR calc non Af Amer 28 (L) >60 mL/min   GFR calc Af Amer 32 (L) >60 mL/min   Anion  gap 13 5 - 15    Comment: Performed at [facility], [place] [address] [place], [place] [address]  Magnesium     Status: None   Collection Time: 03/08/20  1:54 AM  Result Value Ref Range   Magnesium 2.4 1.7 - 2.4 mg/dL     Comment: Performed at [facility] [address], [place], [place] [address]  Glucose, capillary     Status: None   Collection Time: 03/08/20  8:06 AM  Result Value Ref Range   Glucose-Capillary 96 70 - 99 mg/dL    Comment: Glucose reference range applies only to samples taken after fasting for at least 8 hours.  Glucose, capillary     Status: Abnormal   Collection Time: 03/08/20 12:29 PM  Result Value Ref Range   Glucose-Capillary 140 (H) 70 - 99 mg/dL    Comment: Glucose reference range applies only to samples taken after fasting for at least 8 hours.  Glucose, capillary     Status: Abnormal   Collection Time: 03/08/20  4:24 PM  Result Value Ref Range   Glucose-Capillary 142 (H) 70 - 99 mg/dL    Comment: Glucose reference range applies only to samples taken after fasting for at least 8 hours.   Recent Results (from the past 240 hour(s))  Respiratory Panel by RT PCR (Flu A&B, Covid) - Urine, Catheterized     Status: None   Collection Time: 03/02/20 11:41 PM   Specimen: Urine, Catheterized  Result Value Ref Range Status   SARS Coronavirus 2 by RT PCR NEGATIVE NEGATIVE Final    Comment: (NOTE) SARS-CoV-2 target nucleic acids are NOT DETECTED. The SARS-CoV-2 RNA is generally detectable in upper respiratoy specimens during the acute phase of infection. The lowest concentration of SARS-CoV-2 viral copies this assay can detect is 131 copies/mL. A negative result does not preclude SARS-Cov-2 infection and should not be used as the sole basis for treatment or other patient management decisions. A negative result may occur with  improper specimen collection/handling, submission of specimen other than nasopharyngeal swab, presence of viral mutation(s) within the areas targeted by this assay, and inadequate number of viral copies (<131 copies/mL). A negative result must be combined with clinical observations, patient history, and epidemiological information. The expected result  is Negative. Fact Sheet for Patients:  PinkCheek.be Fact Sheet for Healthcare Providers:  GravelBags.it This test is not yet ap proved or cleared by the [place] FDA and  has been authorized for detection and/or diagnosis of SARS-CoV-2 by FDA under an Emergency Use Authorization (EUA). This EUA will remain  in effect (meaning this test can be used) for the duration of the COVID-19 declaration under Section 564(b)(1) of the Act, 21 U.S.C. section 360bbb-3(b)(1), unless the authorization is terminated or revoked sooner.    Influenza A by PCR NEGATIVE NEGATIVE Final   Influenza B by PCR NEGATIVE NEGATIVE Final    Comment: (NOTE) The Xpert Xpress SARS-CoV-2/FLU/RSV assay is intended as an aid in  the diagnosis of influenza from Nasopharyngeal swab specimens and  should not be used as a sole basis for treatment. Nasal washings and  aspirates are unacceptable for Xpert Xpress SARS-CoV-2/FLU/RSV  testing. Fact Sheet for Patients: PinkCheek.be Fact Sheet for Healthcare Providers: GravelBags.it This test is not yet approved or cleared by the [place] FDA and  has been authorized for detection and/or diagnosis of SARS-CoV-2 by  FDA under an Emergency Use Authorization (EUA). This EUA will remain  in effect (meaning  this test can be used) for the duration of the  Covid-19 declaration under Section 564(b)(1) of the Act, 21  U.S.C. section 360bbb-3(b)(1), unless the authorization is  terminated or revoked. Performed at [facility], [place] [address], [place], [place] [address]   Surgical pcr screen     Status: None   Collection Time: 03/04/20  5:26 PM   Specimen: Nasal Mucosa; Nasal Swab  Result Value Ref Range Status   MRSA, PCR NEGATIVE NEGATIVE Final   Staphylococcus aureus NEGATIVE NEGATIVE Final    Comment: (NOTE) The Xpert SA Assay (FDA approved for NASAL  specimens in patients 21 years of age and older), is one component of a comprehensive surveillance program. It is not intended to diagnose infection nor to guide or monitor treatment. Performed at [facility], [place] [address] [place], [place] [address]    Creatinine: Recent Labs    03/03/20 0402 03/04/20 0504 03/05/20 1524 03/06/20 0423 03/07/20 0425 03/07/20 0746 03/08/20 0154  CREATININE 1.30* 1.36* 1.35* 1.45* 1.66* 1.69* 1.70*    Xrays: See report/chart none  Impression/Assessment:  Retention likely from poor bladder contractility from meds and immobility; foley went in easy and likely no stricture Old blood with foley and urine sent for culture  Plan:  Treat as UTI - if culture negative will do CT scan before going home and do outpt cysto (family aware of plan); will follow Ordered cipro 250 mg bid for 7 days  Whitney [name] 03/08/2020, 5:30 PM

## 2020-03-08 NOTE — Progress Notes (Signed)
Coude cath team attempted to insert coude cath and was unsuccessful. Dr. Ree Kida notified. Urology consulted.

## 2020-03-08 NOTE — Progress Notes (Signed)
Physical Therapy Treatment Patient Details Name: Whitney [name] MRN: [id number] DOB: 04-20-1935 Today's Date: 03/08/2020    History of Present Illness Whitney [name] is a 85 y.o. female with medical history significant for insulin-dependent diabetes mellitus, chronic kidney disease stage III, and right hip fracture more than a month ago, presented to the hospital with increasing right hip pain. Patient previously lived in [place] but had since been in [place] where she suffered a right hip fracture more than a month ago.  She was seen in a hospital in [place] where she was treated with some pain medications and Lovenox before flying to the [place].  She was briefly admitted to a hospital in [place] several days ago where she was seen by orthopedic surgery and discharged home with plans for outpatient follow-up.  She has since come to [place], continues to use oxycodone and gabapentin, but has been experiencing increasing pain in the right hip.  Orthopedic surgery was consulted and patient underwent right hip Girdlestone procedure on 03/04/20, during the surgery the patient sustained a spiral fracture of her right distal tibia.    PT Comments     Pt requiring two person total assist to sit up on edge of bed for ~15 minutes. Displays fear of falling, maintaining tight grip on bed rails with posterior and left lateral lean. Increase in pain with all movement. Will continue to progress as tolerated.    Follow Up Recommendations  SNF;Supervision/Assistance - 24 hour     Equipment Recommendations  Wheelchair (measurements PT);Wheelchair cushion (measurements PT);Hospital bed;Other (comment)(hoyer lift)    Recommendations for Other Services       Precautions / Restrictions Precautions Precautions: Fall Required Braces or Orthoses: Splint/Cast Splint/Cast: R LE Splint/Cast - Date Prophylactic Dressing Applied (if applicable): 88/41/66 Restrictions Weight Bearing Restrictions:  Yes RLE Weight Bearing: Non weight bearing    Mobility  Bed Mobility Overal bed mobility: Needs Assistance Bed Mobility: Rolling;Sidelying to Sit;Sit to Sidelying Rolling: +2 for physical assistance;Total assist Sidelying to sit: Total assist;+2 for physical assistance;+2 for safety/equipment     Sit to sidelying: Total assist;+2 for physical assistance;+2 for safety/equipment General bed mobility comments: totalA for all aspects;pt appeared to have increased pain with BLE movement and increased fear of falling;difficult to differentiate between the two  Transfers                 General transfer comment: not attempted due to continued stooling, and increased pain   Ambulation/Gait                 Stairs             Wheelchair Mobility    Modified Rankin (Stroke Patients Only)       Balance Overall balance assessment: Needs assistance Sitting-balance support: Bilateral upper extremity supported;Feet supported Sitting balance-Leahy Scale: Zero Sitting balance - Comments: maxA with BUE support;pt with posterior lean;difficult to fully assess fear vs balance;pt tolerated sitting EOB for 48minutes;pt with increased fear of falling, highly distracted by pain and yelling out making it difficult to attempt to calm her down;she required max cues for positioning in upright posture Postural control: Posterior lean                                  Cognition Arousal/Alertness: Awake/alert Behavior During Therapy: WFL for tasks assessed/performed Overall Cognitive Status: Difficult to assess  General Comments: does follow basic commands;fear of falling      Exercises      General Comments General comments (skin integrity, edema, etc.): pt's daughter present and interpreting during session, daughter reports she has a Buyer, retail in [place]      Pertinent Vitals/Pain Pain Assessment: Faces Faces Pain  Scale: Hurts worst Pain Location: LLE with movement Pain Descriptors / Indicators: Grimacing;Guarding;Moaning;Crying;Other (Comment)(screaming) Pain Intervention(s): Monitored during session;Limited activity within patient's tolerance;Repositioned    Home Living                      Prior Function            PT Goals (current goals can now be found in the care plan section) Acute Rehab PT Goals Patient Stated Goal: pt did not state Potential to Achieve Goals: Fair    Frequency    Min 3X/week      PT Plan Current plan remains appropriate    Co-evaluation PT/OT/SLP Co-Evaluation/Treatment: Yes Reason for Co-Treatment: Complexity of the patient's impairments (multi-system involvement);Necessary to address cognition/behavior during functional activity;For patient/therapist safety;To address functional/ADL transfers PT goals addressed during session: Mobility/safety with mobility OT goals addressed during session: ADL's and self-care      AM-PAC PT "6 Clicks" Mobility   Outcome Measure  Help needed turning from your back to your side while in a flat bed without using bedrails?: Total Help needed moving from lying on your back to sitting on the side of a flat bed without using bedrails?: Total Help needed moving to and from a bed to a chair (including a wheelchair)?: Total Help needed standing up from a chair using your arms (e.g., wheelchair or bedside chair)?: Total Help needed to walk in hospital room?: Total Help needed climbing 3-5 steps with a railing? : Total 6 Click Score: 6    End of Session   Activity Tolerance: Patient limited by pain Patient left: in bed;with family/visitor present Nurse Communication: Mobility status PT Visit Diagnosis: Other abnormalities of gait and mobility (R26.89);Muscle weakness (generalized) (M62.81);Pain Pain - Right/Left: Right Pain - part of body: Hip;Leg     Time: 1607-3710 PT Time Calculation (min) (ACUTE ONLY): 24  min  Charges:  $Therapeutic Activity: 8-22 mins                       Wyona Almas, PT, DPT Acute Rehabilitation Services Pager [phone] Office [phone]    Deno Etienne 03/08/2020, 5:17 PM

## 2020-03-08 NOTE — Progress Notes (Signed)
New orders for Flowmax. Nurse tech's attempted to in&out cath with no success.

## 2020-03-08 NOTE — Progress Notes (Signed)
Sharyn Lull RN, attempted in&out cath, unsuccessful. Pt's daughter said pt is circumcised. Dr. Ree Kida notified, cath team will be paged.

## 2020-03-08 NOTE — Progress Notes (Signed)
Urology successfully inserted foley cath. Urine collected and sent to lab for cultures.

## 2020-03-08 NOTE — Progress Notes (Signed)
Pt refusing medications, Dr. Ree Kida notified. Pt's daughter will be visiting today, will attempt a second time once she is on the unit.

## 2020-03-08 NOTE — Progress Notes (Signed)
Currently waiting on RN from 6N to place coude cath. Dr. Ree Kida notified.

## 2020-03-08 NOTE — Progress Notes (Signed)
PROGRESS NOTE    Whitney [name]  RCV:[id number] DOB: 04/09/1935 DOA: 03/02/2020 PCP: Patient, No Pcp Per   Brief Narrative:  HPI on 03/02/2020 by Dr. Christia Reading Opyd Whitney [name] is a 85 y.o. female with medical history significant for insulin-dependent diabetes mellitus, chronic kidney disease stage III, and right hip fracture more than a month ago, now presenting to the emergency department with increasing right hip pain despite taking oxycodone and gabapentin at home.  Patient is accompanied by her daughter who assists with the history.  Patient previously lived in [place] but had since been in [place] where she suffered a right hip fracture more than a month ago.  She was seen in a hospital in [place] where she was treated with some pain medications and Lovenox for 4 flying to the [place].  She was briefly admitted to a hospital in [place] several days ago where she was seen by orthopedic surgery and discharged home with plans for outpatient follow-up.  She has since come to [place], continues to use oxycodone and gabapentin, but has been experiencing increasing pain in the right hip.  She denied any fevers, chills, chest pain, cough, or shortness of breath.  Interim history Fracture and intractable pain.  Hospitalization complicated by postoperative anemia, hypertension, hyperkalemia and now urinary retention. 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  Was unable to walk since 2011 and has been in a wheelchair. -Presented with increasing pain -Has been taking oxycodone and gabapentin at home -X-ray showed right femoral neck fracture with varus angulation -Orthopedic surgery consulted and appreciated status post right hip Girdlestone procedure on 03/04/2020 -Continue pain control as necessary -Patient to follow-up with Dr. Erlinda Hong in 2 weeks with suture removal in 6  weeks. -Orthopedics also recommending continuation of splint and elevation of the right lower extremity.  Nonweightbearing to right lower extremity.  Acute urinary retention -Bladder scan showed 571cc    -I/O ordered -will place on flomax   Acute metabolic encephalopathy -Possibly secondary to recent surgery vs medications -will obtain UA  Anemia, postoperative  -No overt bleeding noted -Hemoglobin had dropped to 6.9 -Patient received blood transfusion, hemoglobin currently 11.1  Hypotension -Patient received IV fluids and 1 dose of IV albumin on 03/07/2020 -BP stable -Will continue to monitor and treat appropriately  Hyperkalemia -Potassium was up to 6.4, down to 5.7 today -We will give dose of Lokelma and monitor BMP -She was given calcium gluconate, IV insulin and dextrose along with Kayexalate  Chronic kidney disease, stage IIIb -Appears to be at baseline, continue to monitor BMP  Diabetes mellitus, type II -Hemoglobin A1c is 5.4 on 02/28/20 (per Care Everywhere) -Continue Lantus, insulin sliding scale and CBG monitoring  Bilateral lower extremity edema -Patient with pitting lower extremity edema and bullae with serous weeping on presentation -Suspect third spacing likely secondary to hypoalbuminemia/malnutrition -No history of congestive heart failure -Patient is bedbound at baseline and uses a wheelchair -BNP within normal limits -Echocardiogram showed preserved LV function  Protein calorie malnutrition -Nutrition consulted and appreciated, continue supplements  Constipation -Continue bowel regimen -Patient able to have bowel movement today  DVT Prophylaxis  Lovenox  Code Status: Full  Family Communication: None at bedside. Daughter via phone  Disposition Plan:  Status is: Inpatient  Remains inpatient appropriate because: Patient level of care appropriate due to severity of illness and status post surgery with hypotension, hypokalemia and anemia.  Now with  urinary retention.   Dispo: The patient is from: Home              Anticipated d/c is to: Home w/ [facility]              Anticipated d/c date is: 2 days              Patient currently is not medically stable to d/c.   Consultants Orthopedics  Procedures  Right hip Girdlestone procedure by orthopedics on 03/05/2020 Right leg splint 03/05/2020  Antibiotics   Anti-infectives (From admission, onward)   Start     Dose/Rate Route Frequency Ordered Stop   03/05/20 2100  ceFAZolin (ANCEF) IVPB 2g/100 mL premix     2 g 200 mL/hr over 30 Minutes Intravenous Every 8 hours 03/05/20 2043 03/06/20 0547   03/05/20 0818  vancomycin (VANCOCIN) powder  Status:  Discontinued       As needed 03/05/20 0819 03/05/20 0913   03/05/20 0600  ceFAZolin (ANCEF) IVPB 2g/100 mL premix  Status:  Discontinued     2 g 200 mL/hr over 30 Minutes Intravenous On call to O.R. 03/04/20 1713 03/05/20 1055      Subjective:   Whitney [name] seen and examined today.  No new complaints today.  States that she is waiting for food to come.  Has had a lot of pain mainly in her hip.  Appears to be confused.  Objective:   Vitals:   03/07/20 2100 03/08/20 0306 03/08/20 0808 03/08/20 1333  BP: (!) 131/54 (!) 116/44 (!) 106/49 (!) 126/59  Pulse: 100 98 97 89  Resp: 18 17 17 15   Temp: 97.8 F (36.6 C) 97.7 F (36.5 C) 97.9 F (36.6 C) 98.4 F (36.9 C)  TempSrc: Oral Oral Oral Oral  SpO2: 92% 94%  92%  Weight:      Height:        Intake/Output Summary (Last 24 hours) at 03/08/2020 1424 Last data filed at 03/08/2020 1100 Gross per 24 hour  Intake 2301.54 ml  Output 2 ml  Net 2299.54 ml   Filed Weights   03/03/20 0421 03/03/20 0838  Weight: 79.7 kg 79.7 kg    Exam  General: Well developed, well nourished, elderly, NAD  HEENT: NCAT, mucous membranes moist.   Cardiovascular: S1 S2 auscultated, RRR  Respiratory: Clear to auscultation bilaterally  Abdomen: Soft, nontender, nondistended, + bowel  sounds  Extremities: Bilateral lower extremity pitting edema.  Extremity in splint  Neuro: Awake and alert, however does appear to be confused.  Nonfocal  Psych: Mildly agitated   Data Reviewed: I have personally reviewed following labs and imaging studies  CBC: Recent Labs  Lab 03/02/20 2117 03/03/20 0402 03/04/20 0504 03/05/20 1524 03/06/20 0423 03/07/20 0425 03/08/20 0154  WBC 6.2   < > 4.6 10.9* 10.2 7.4 10.6*  NEUTROABS 4.1  --   --   --   --   --   --   HGB 11.0*   < > 9.6* 8.6* 8.1* 6.9* 11.1*  HCT 36.5   < > 31.9* 28.1* 27.0* 23.0* 35.0*  MCV 99.7   < > 100.0 98.9 100.0 101.3* 97.8  PLT 201   < > 162 139* 136* 132* 149*   < > = values in this interval not displayed.   Basic Metabolic Panel: Recent Labs  Lab 03/04/20 0504 03/04/20 0504 03/05/20 1524 03/05/20 1524 03/06/20 0423 03/07/20 0425 03/07/20 0746 03/07/20 2204 03/08/20 0154  NA 143   < >  140  --  142 141 141  --  142  K 4.3   < > 4.8   < > 4.9 6.0* 6.4* 6.0* 5.7*  CL 117*   < > 113*  --  115* 115* 120*  --  115*  CO2 22   < > 23  --  21* 21* 17*  --  14*  GLUCOSE 121*   < > 313*  --  249* 85 96  --  104*  BUN 29*   < > 31*  --  32* 37* 37*  --  37*  CREATININE 1.36*   < > 1.35*  --  1.45* 1.66* 1.69*  --  1.70*  CALCIUM 7.6*   < > 7.6*  --  7.5* 7.3* 7.4*  --  7.7*  MG 1.9  --  2.2  --   --  2.3  --   --  2.4   < > = values in this interval not displayed.   GFR: Estimated Creatinine Clearance: 24.9 mL/min (A) (by C-G formula based on SCr of 1.7 mg/dL (H)). Liver Function Tests: Recent Labs  Lab 03/02/20 2117 03/03/20 0402 03/04/20 0504 03/08/20 0154  AST 27 27 35 29  ALT 18 16 17 6   ALKPHOS 321* 282* 260* 206*  BILITOT 0.8 0.4 0.2* 1.1  PROT 6.0* 5.4* 5.0* 5.1*  ALBUMIN 1.8* 1.6* 1.4* 1.7*   No results for input(s): LIPASE, AMYLASE in the last 168 hours. No results for input(s): AMMONIA in the last 168 hours. Coagulation Profile: Recent Labs  Lab 03/02/20 2117  INR 1.2    Cardiac Enzymes: No results for input(s): CKTOTAL, CKMB, CKMBINDEX, TROPONINI in the last 168 hours. BNP (last 3 results) No results for input(s): PROBNP in the last 8760 hours. HbA1C: No results for input(s): HGBA1C in the last 72 hours. CBG: Recent Labs  Lab 03/07/20 1152 03/07/20 1727 03/07/20 2052 03/08/20 0806 03/08/20 1229  GLUCAP 162* 83 86 96 140*   Lipid Profile: No results for input(s): CHOL, HDL, LDLCALC, TRIG, CHOLHDL, LDLDIRECT in the last 72 hours. Thyroid Function Tests: No results for input(s): TSH, T4TOTAL, FREET4, T3FREE, THYROIDAB in the last 72 hours. Anemia Panel: No results for input(s): VITAMINB12, FOLATE, FERRITIN, TIBC, IRON, RETICCTPCT in the last 72 hours. Urine analysis:    Component Value Date/Time   COLORURINE YELLOW 03/02/2020 2354   APPEARANCEUR CLEAR 03/02/2020 2354   LABSPEC 1.020 03/02/2020 2354   PHURINE 5.0 03/02/2020 2354   GLUCOSEU NEGATIVE 03/02/2020 2354   HGBUR NEGATIVE 03/02/2020 2354   BILIRUBINUR NEGATIVE 03/02/2020 2354   KETONESUR NEGATIVE 03/02/2020 2354   PROTEINUR NEGATIVE 03/02/2020 2354   NITRITE NEGATIVE 03/02/2020 2354   LEUKOCYTESUR TRACE (A) 03/02/2020 2354   Sepsis Labs: @LABRCNTIP (procalcitonin:4,lacticidven:4)  ) Recent Results (from the past 240 hour(s))  Respiratory Panel by RT PCR (Flu A&B, Covid) - Urine, Catheterized     Status: None   Collection Time: 03/02/20 11:41 PM   Specimen: Urine, Catheterized  Result Value Ref Range Status   SARS Coronavirus 2 by RT PCR NEGATIVE NEGATIVE Final    Comment: (NOTE) SARS-CoV-2 target nucleic acids are NOT DETECTED. The SARS-CoV-2 RNA is generally detectable in upper respiratoy specimens during the acute phase of infection. The lowest concentration of SARS-CoV-2 viral copies this assay can detect is 131 copies/mL. A negative result does not preclude SARS-Cov-2 infection and should not be used as the sole basis for treatment or other patient management  decisions. A negative result may occur with  improper specimen collection/handling, submission of specimen other than nasopharyngeal swab, presence of viral mutation(s) within the areas targeted by this assay, and inadequate number of viral copies (<131 copies/mL). A negative result must be combined with clinical observations, patient history, and epidemiological information. The expected result is Negative. Fact Sheet for Patients:  PinkCheek.be Fact Sheet for Healthcare Providers:  GravelBags.it This test is not yet ap proved or cleared by the [place] FDA and  has been authorized for detection and/or diagnosis of SARS-CoV-2 by FDA under an Emergency Use Authorization (EUA). This EUA will remain  in effect (meaning this test can be used) for the duration of the COVID-19 declaration under Section 564(b)(1) of the Act, 21 U.S.C. section 360bbb-3(b)(1), unless the authorization is terminated or revoked sooner.    Influenza A by PCR NEGATIVE NEGATIVE Final   Influenza B by PCR NEGATIVE NEGATIVE Final    Comment: (NOTE) The Xpert Xpress SARS-CoV-2/FLU/RSV assay is intended as an aid in  the diagnosis of influenza from Nasopharyngeal swab specimens and  should not be used as a sole basis for treatment. Nasal washings and  aspirates are unacceptable for Xpert Xpress SARS-CoV-2/FLU/RSV  testing. Fact Sheet for Patients: PinkCheek.be Fact Sheet for Healthcare Providers: GravelBags.it This test is not yet approved or cleared by the [place] FDA and  has been authorized for detection and/or diagnosis of SARS-CoV-2 by  FDA under an Emergency Use Authorization (EUA). This EUA will remain  in effect (meaning this test can be used) for the duration of the  Covid-19 declaration under Section 564(b)(1) of the Act, 21  U.S.C. section 360bbb-3(b)(1), unless the authorization  is  terminated or revoked. Performed at [facility], [place] [address], [place], [place] [address]   Surgical pcr screen     Status: None   Collection Time: 03/04/20  5:26 PM   Specimen: Nasal Mucosa; Nasal Swab  Result Value Ref Range Status   MRSA, PCR NEGATIVE NEGATIVE Final   Staphylococcus aureus NEGATIVE NEGATIVE Final    Comment: (NOTE) The Xpert SA Assay (FDA approved for NASAL specimens in patients 43 years of age and older), is one component of a comprehensive surveillance program. It is not intended to diagnose infection nor to guide or monitor treatment. Performed at [facility], [place] [address], [place], [place] [address]       Radiology Studies: No results found.   Scheduled Meds: . bisacodyl  10 mg Rectal Daily  . bupivacaine liposome  20 mL Infiltration Once  . docusate sodium  100 mg Oral BID  . feeding supplement (ENSURE ENLIVE)  237 mL Oral TID BM  . gabapentin  300 mg Oral TID  . insulin aspart  0-5 Units Subcutaneous QHS  . insulin aspart  0-9 Units Subcutaneous TID WC  . insulin glargine  10 Units Subcutaneous QHS  . multivitamin with minerals  1 tablet Oral Daily  . polyethylene glycol  17 g Oral Daily  . sodium chloride flush  3 mL Intravenous Q12H  . sodium zirconium cyclosilicate  5 g Oral BID  . tamsulosin  0.4 mg Oral Daily   Continuous Infusions: . sodium chloride    . sodium chloride Stopped (03/08/20 0932)  . methocarbamol (ROBAXIN) IV       LOS: 5 days   Time Spent in minutes   45 minutes  Whitney [name] D.O. on 03/08/2020 at 2:24 PM  Between 7am to 7pm - Please see pager noted on [email or web address]  After 7pm  go to [email or web address]  And look for the night coverage person covering for me after hours  [facility] Office  [phone]

## 2020-03-08 NOTE — Progress Notes (Signed)
Nutrition Follow-up  DOCUMENTATION CODES:   Obesity unspecified  INTERVENTION:   -Increase Ensure Enlive po to TID, each supplement provides 350 kcal and 20 grams of protein -Continue MVI with minerals daily  NUTRITION DIAGNOSIS:   Increased nutrient needs related to wound healing as evidenced by estimated needs.  Ongoing  GOAL:   Patient will meet greater than or equal to 90% of their needs  Progressing   MONITOR:   PO intake, Supplement acceptance, Labs, Weight trends, Skin, I & O's  REASON FOR ASSESSMENT:   Consult Assessment of nutrition requirement/status  ASSESSMENT:   Whitney [name] is a 85 y.o. female with medical history significant for insulin-dependent diabetes mellitus, chronic kidney disease stage III, and right hip fracture more than a month ago, now presenting to the emergency department with increasing right hip pain despite taking oxycodone and gabapentin at home. Patient previously lived in [place] but had since been in [place] where she suffered a right hip fracture more than a month ago.  She was seen in a hospital in [place] where she was treated with some pain medications and Lovenox for 4 flying to the [place].  She was briefly admitted to a hospital in [place] several days ago where she was seen by orthopedic surgery and discharged home with plans for outpatient follow-up.  She has since come to [place], continues to use oxycodone and gabapentin, but has been experiencing increasing pain in the right hip.  She denied any fevers, chills, chest pain, cough, or shortness of breath.  4/25- s/p Right hip girdlestone procedure  Reviewed I/O's: +2.2 L x 24 hours and +3.8 L since admission  Attempted to speak with pt via phone, however, no answer.   Pt's intake has declined since last visit. Noted meal completion 5-75%. Per chart review, pt with pain and confusion. She is consuming Ensure supplements.   Pt with poor oral intake and would  benefit from nutrient dense supplement. One Ensure Enlive supplement provides 350 kcals, 20 grams protein, and 44-45 grams of carbohydrate vs one Glucerna shake supplement, which provides 220 kcals, 10 grams of protein, and 26 grams of carbohydrate. Given pt's hx of DM, RD will reassess adequacy of PO intake, CBGS, and adjust supplement regimen as appropriate at follow-up.    Per therapy notes, recommending SNF at discharge.   Labs reviewed: K: 5.7, CBGS: 83-96 (inpatient orders for glycemic control are 0-5 units insulin aspart q HS, 0-9 units insulin aspart TID with meals,  and 10 units insulin glargine q HS).   Diet Order:   Diet Order            Diet Carb Modified Fluid consistency: Thin; Room service appropriate? No  Diet effective now              EDUCATION NEEDS:   No education needs have been identified at this time  Skin:  Skin Assessment: Skin Integrity Issues: Skin Integrity Issues:: Incisions Incisions: bilateral hips Other: bullae present on BLE; partial thickness area to lt buttocks; hyperpignemtation to sacrum  Last BM:  03/06/20  Height:   Ht Readings from Last 1 Encounters:  03/03/20 5\' 2"  (1.575 m)    Weight:   Wt Readings from Last 1 Encounters:  03/03/20 79.7 kg    Ideal Body Weight:  50 kg  BMI:  Body mass index is 32.14 kg/m.  Estimated Nutritional Needs:   Kcal:  1500-1700  Protein:  80-95 grams  Fluid:  > 1.5 L  Loistine Chance, RD, LDN, [place] Registered Dietitian II Certified Diabetes Care and Education Specialist Please refer to [facility] for RD and/or RD on-call/weekend/after hours pager

## 2020-03-08 NOTE — Progress Notes (Signed)
Occupational Therapy Treatment Patient Details Name: Whitney [name] MRN: [id number] DOB: [birth year] Today's Date: 03/08/2020    History of present illness Whitney [name] is a 85 y.o. female with medical history significant for insulin-dependent diabetes mellitus, chronic kidney disease stage III, and right hip fracture more than a month ago, presented to the hospital with increasing right hip pain. Patient previously lived in [place] but had since been in [place] where she suffered a right hip fracture more than a month ago.  She was seen in a hospital in [place] where she was treated with some pain medications and Lovenox before flying to the [place].  She was briefly admitted to a hospital in [place] several days ago where she was seen by orthopedic surgery and discharged home with plans for outpatient follow-up.  She has since come to [place], continues to use oxycodone and gabapentin, but has been experiencing increasing pain in the right hip.  Orthopedic surgery was consulted and patient underwent right hip Girdlestone procedure on 03/04/20, during the surgery the patient sustained a spiral fracture of her right distal tibia.   OT comments  Pt received in bed with daughter present and interpreting throughout session. Pt required totalA for all aspects of bed mobility this session. She sat EOB for about 15 minutes requiring maxA for cues for postural control. Pt with fear of falling, maintaining tight grasp onto bed rails and demonstrating posterior lean. Pt required maxA+2 for balance sitting EOB, difficult to distinguish between fear and postural control. Pt will continue to benefit from skilled OT services to maximize safety and independence with ADL/IADL and functional mobility. Will continue to follow acutely and progress as tolerated.    Follow Up Recommendations  Supervision/Assistance - 24 hour;CIR    Equipment Recommendations  Hospital bed    Recommendations for Other  Services      Precautions / Restrictions Precautions Precautions: Fall Required Braces or Orthoses: Splint/Cast Splint/Cast: R LE Splint/Cast - Date Prophylactic Dressing Applied (if applicable): 62/83/66 Restrictions Weight Bearing Restrictions: Yes RLE Weight Bearing: Non weight bearing       Mobility Bed Mobility Overal bed mobility: Needs Assistance Bed Mobility: Rolling;Sidelying to Sit;Sit to Sidelying Rolling: +2 for physical assistance;Total assist Sidelying to sit: Total assist;+2 for physical assistance;+2 for safety/equipment     Sit to sidelying: Total assist;+2 for physical assistance;+2 for safety/equipment General bed mobility comments: totalA for all aspects;pt appeared to have increased pain with BLE movement and increased fear of falling;difficult to differentiate between the two  Transfers                 General transfer comment: not attempted due to continued stooling, and increased pain     Balance Overall balance assessment: Needs assistance Sitting-balance support: Bilateral upper extremity supported;Feet supported Sitting balance-Leahy Scale: Zero Sitting balance - Comments: maxA with BUE support;pt with posterior lean;difficult to fully assess fear vs balance;pt tolerated sitting EOB for 25minutes;pt with increased fear of falling, highly distracted by pain and yelling out making it difficult to attempt to calm her down;she required max cues for positioning in upright posture Postural control: Posterior lean                                 ADL either performed or assessed with clinical judgement   ADL Overall ADL's : Needs assistance/impaired     Grooming: Maximal assistance;Sitting  Lower Body Dressing: Total assistance;Bed level               Functional mobility during ADLs: +2 for physical assistance;Total assistance(rolling only )       Vision       Perception     Praxis       Cognition Arousal/Alertness: Awake/alert Behavior During Therapy: WFL for tasks assessed/performed Overall Cognitive Status: Difficult to assess                                 General Comments: does follow basic commands;fear of falling        Exercises     Shoulder Instructions       General Comments pt's daughter present and interpreting during session, daughter reports she has a Buyer, retail in [place]    Pertinent Vitals/ Pain       Pain Assessment: Faces Faces Pain Scale: Hurts whole lot Pain Location: LLE with movement Pain Descriptors / Indicators: Grimacing;Guarding;Moaning;Crying Pain Intervention(s): Monitored during session;Limited activity within patient's tolerance  Home Living                                          Prior Functioning/Environment              Frequency  Min 2X/week        Progress Toward Goals  OT Goals(current goals can now be found in the care plan section)  Progress towards OT goals: Not progressing toward goals - comment(limited by pain and fear)  Acute Rehab OT Goals Patient Stated Goal: pt did not state OT Goal Formulation: With patient Time For Goal Achievement: 03/20/20 Potential to Achieve Goals: Fair ADL Goals Pt Will Perform Eating: with set-up;bed level;sitting Pt Will Perform Grooming: with min assist;sitting;bed level Pt Will Perform Upper Body Bathing: with set-up;with supervision;sitting;bed level Pt Will Transfer to Toilet: with +2 assist;bedside commode;with transfer board;with mod assist Pt/caregiver will Perform Home Exercise Program: Increased strength;Right Upper extremity;Left upper extremity;With Supervision;With theraband Additional ADL Goal #1: Pt will maintain EOB sitting with min guard assist x 7 mins in prep for functional transfers  Plan Discharge plan remains appropriate    Co-evaluation    PT/OT/SLP Co-Evaluation/Treatment: Yes Reason for Co-Treatment:  Complexity of the patient's impairments (multi-system involvement);For patient/therapist safety;To address functional/ADL transfers   OT goals addressed during session: ADL's and self-care      AM-PAC OT "6 Clicks" Daily Activity     Outcome Measure   Help from another person eating meals?: A Little Help from another person taking care of personal grooming?: A Lot Help from another person toileting, which includes using toliet, bedpan, or urinal?: Total Help from another person bathing (including washing, rinsing, drying)?: A Lot Help from another person to put on and taking off regular upper body clothing?: Total Help from another person to put on and taking off regular lower body clothing?: Total 6 Click Score: 10    End of Session Equipment Utilized During Treatment: Oxygen  OT Visit Diagnosis: Pain Pain - Right/Left: Right Pain - part of body: Hip;Leg   Activity Tolerance Patient limited by pain;Other (comment)(pt limited by fear of falling)   Patient Left in bed;with call bell/phone within reach;with family/visitor present   Nurse Communication Mobility status        Time: 1517-6160 OT Time Calculation (  min): 23 min  Charges: OT General Charges $OT Visit: 1 Visit OT Treatments $Self Care/Home Management : 8-22 mins  Helene Kelp OTR/L Acute Rehabilitation Services Office: [place] 03/08/2020, 4:00 PM

## 2020-03-08 NOTE — Plan of Care (Signed)
  Problem: Activity: Goal: Ability to ambulate and perform ADLs will improve Outcome: Progressing   

## 2020-03-08 NOTE — Progress Notes (Signed)
Dr. Ree Kida notified about pt not being able to void, previous bladder scan 571cc. Pt insisted being impacted was the reason she could not void. Pt previously refusing in&out cath. Disimpacted pt, followed by multiple bm's. Pt tried to use bedpan multiple times, but was still not able to void. Will proceed with an in&out cath. Daughter educated pt on reason behind having to in&out cath, pt agreed.

## 2020-03-09 ENCOUNTER — Inpatient Hospital Stay (HOSPITAL_COMMUNITY): Payer: 59

## 2020-03-09 LAB — COMPREHENSIVE METABOLIC PANEL
ALT: <5 U/L (ref 0–44)
AST: 21 U/L (ref 15–41)
Albumin: 1.6 g/dL — ABNORMAL LOW (ref 3.5–5.0)
Alkaline Phosphatase: 206 U/L — ABNORMAL HIGH (ref 38–126)
Anion gap: 7 (ref 5–15)
BUN: 44 mg/dL — ABNORMAL HIGH (ref 8–23)
CO2: 21 mmol/L — ABNORMAL LOW (ref 22–32)
Calcium: 7.6 mg/dL — ABNORMAL LOW (ref 8.9–10.3)
Chloride: 113 mmol/L — ABNORMAL HIGH (ref 98–111)
Creatinine, Ser: 1.88 mg/dL — ABNORMAL HIGH (ref 0.44–1.00)
GFR calc Af Amer: 28 mL/min — ABNORMAL LOW (ref >60)
GFR calc non Af Amer: 24 mL/min — ABNORMAL LOW (ref >60)
Glucose, Bld: 189 mg/dL — ABNORMAL HIGH (ref 70–99)
Potassium: 5.5 mmol/L — ABNORMAL HIGH (ref 3.5–5.1)
Sodium: 141 mmol/L (ref 135–145)
Total Bilirubin: 1.1 mg/dL (ref 0.3–1.2)
Total Protein: 5 g/dL — ABNORMAL LOW (ref 6.5–8.1)

## 2020-03-09 LAB — BASIC METABOLIC PANEL WITH GFR
Anion gap: 4 — ABNORMAL LOW (ref 5–15)
BUN: 44 mg/dL — ABNORMAL HIGH (ref 8–23)
CO2: 21 mmol/L — ABNORMAL LOW (ref 22–32)
Calcium: 7.6 mg/dL — ABNORMAL LOW (ref 8.9–10.3)
Chloride: 116 mmol/L — ABNORMAL HIGH (ref 98–111)
Creatinine, Ser: 1.63 mg/dL — ABNORMAL HIGH (ref 0.44–1.00)
GFR calc Af Amer: 34 mL/min — ABNORMAL LOW
GFR calc non Af Amer: 29 mL/min — ABNORMAL LOW
Glucose, Bld: 175 mg/dL — ABNORMAL HIGH (ref 70–99)
Potassium: 5.1 mmol/L (ref 3.5–5.1)
Sodium: 141 mmol/L (ref 135–145)

## 2020-03-09 LAB — TSH: TSH: 0.78 u[IU]/mL (ref 0.350–4.500)

## 2020-03-09 LAB — CBC
HCT: 33.3 % — ABNORMAL LOW (ref 36.0–46.0)
HCT: 34 % — ABNORMAL LOW (ref 36.0–46.0)
HCT: 32.5 % — ABNORMAL LOW (ref 36.0–46.0)
Hemoglobin: 10.2 g/dL — ABNORMAL LOW (ref 12.0–15.0)
Hemoglobin: 10.5 g/dL — ABNORMAL LOW (ref 12.0–15.0)
Hemoglobin: 10.2 g/dL — ABNORMAL LOW (ref 12.0–15.0)
MCH: 30 pg (ref 26.0–34.0)
MCH: 30.7 pg (ref 26.0–34.0)
MCH: 30.6 pg (ref 26.0–34.0)
MCHC: 30.6 g/dL (ref 30.0–36.0)
MCHC: 30.9 g/dL (ref 30.0–36.0)
MCHC: 31.4 g/dL (ref 30.0–36.0)
MCV: 97.9 fL (ref 80.0–100.0)
MCV: 99.4 fL (ref 80.0–100.0)
MCV: 97.6 fL (ref 80.0–100.0)
Platelets: 142 10*3/uL — ABNORMAL LOW (ref 150–400)
Platelets: 134 10*3/uL — ABNORMAL LOW (ref 150–400)
Platelets: 142 10*3/uL — ABNORMAL LOW (ref 150–400)
RBC: 3.4 MIL/uL — ABNORMAL LOW (ref 3.87–5.11)
RBC: 3.42 MIL/uL — ABNORMAL LOW (ref 3.87–5.11)
RBC: 3.33 MIL/uL — ABNORMAL LOW (ref 3.87–5.11)
RDW: 15.9 % — ABNORMAL HIGH (ref 11.5–15.5)
RDW: 16 % — ABNORMAL HIGH (ref 11.5–15.5)
RDW: 15.9 % — ABNORMAL HIGH (ref 11.5–15.5)
WBC: 8.3 10*3/uL (ref 4.0–10.5)
WBC: 7.7 10*3/uL (ref 4.0–10.5)
WBC: 7.3 10*3/uL (ref 4.0–10.5)
nRBC: 0 % (ref 0.0–0.2)
nRBC: 0.3 % — ABNORMAL HIGH (ref 0.0–0.2)
nRBC: 0 % (ref 0.0–0.2)

## 2020-03-09 LAB — LACTIC ACID, PLASMA: Lactic Acid, Venous: 1.1 mmol/L (ref 0.5–1.9)

## 2020-03-09 LAB — AMMONIA: Ammonia: 35 umol/L (ref 9–35)

## 2020-03-09 LAB — URINE CULTURE: Culture: NO GROWTH

## 2020-03-09 LAB — GLUCOSE, CAPILLARY
Glucose-Capillary: 144 mg/dL — ABNORMAL HIGH (ref 70–99)
Glucose-Capillary: 161 mg/dL — ABNORMAL HIGH (ref 70–99)
Glucose-Capillary: 187 mg/dL — ABNORMAL HIGH (ref 70–99)
Glucose-Capillary: 151 mg/dL — ABNORMAL HIGH (ref 70–99)

## 2020-03-09 LAB — VITAMIN B12: Vitamin B-12: 1650 pg/mL — ABNORMAL HIGH (ref 180–914)

## 2020-03-09 LAB — MAGNESIUM: Magnesium: 2.4 mg/dL (ref 1.7–2.4)

## 2020-03-09 MED ORDER — QUETIAPINE FUMARATE 25 MG PO TABS
12.5000 mg | ORAL_TABLET | Freq: Once | ORAL | Status: AC
  Administered 2020-03-09: 12.5 mg via ORAL
  Filled 2020-03-09: qty 1

## 2020-03-09 MED ORDER — CIPROFLOXACIN HCL 500 MG PO TABS
250.0000 mg | ORAL_TABLET | Freq: Every day | ORAL | Status: DC
  Administered 2020-03-09: 250 mg via ORAL

## 2020-03-09 MED ORDER — ALBUMIN HUMAN 5 % IV SOLN
12.5000 g | Freq: Once | INTRAVENOUS | Status: AC
  Administered 2020-03-10: 12.5 g via INTRAVENOUS
  Filled 2020-03-09: qty 250

## 2020-03-09 NOTE — Progress Notes (Signed)
Rapid response RN called to floor to take a second look at patient, Dr. Ree Kida notified. Pt currently in bed, with daughter at bedside. Pt confused and agitated, Seroquel given.

## 2020-03-09 NOTE — Plan of Care (Signed)
  Problem: Activity: Goal: Ability to ambulate and perform ADLs will improve Outcome: Progressing   

## 2020-03-09 NOTE — Progress Notes (Signed)
Patient's urine culture negative I will get CT scan without contrast because of her renal function.  This will help assess hematuria when I placed the catheter.

## 2020-03-09 NOTE — Progress Notes (Signed)
Pt refusing all medications this morning. Multiple attempts made. Pt also refused lab three times, Dr. Ree Kida notified. Will wait for pt's daughter to arrive to the unit to try again.

## 2020-03-09 NOTE — Progress Notes (Signed)
Dr. Dewaine Oats paged due to pt's MEWS score of yellow. Vital signs taken. Pt not in distress and tolerated well. Will continue to monitor.

## 2020-03-09 NOTE — Progress Notes (Signed)
Pt's daughter arrived to the unit. Pt's daughter was able to translate and have her mother agree to morning medications. Pt very confused and agitated this morning.

## 2020-03-09 NOTE — Progress Notes (Signed)
Foley draining old blood CT pending

## 2020-03-09 NOTE — Progress Notes (Signed)
PROGRESS NOTE    Whitney [name]  TMH:[id number] DOB: December 02, 1934 DOA: 03/02/2020 PCP: Patient, No Pcp Per   Brief Narrative:  HPI on 03/02/2020 by Dr. Christia [place] Opyd Whitney [name] is a 85 y.o. female with medical history significant for insulin-dependent diabetes mellitus, chronic kidney disease stage III, and right hip fracture more than a month ago, now presenting to the emergency department with increasing right hip pain despite taking oxycodone and gabapentin at home.  Patient is accompanied by her daughter who assists with the history.  Patient previously lived in [place] but had since been in [place] where she suffered a right hip fracture more than a month ago.  She was seen in a hospital in [place] where she was treated with some pain medications and Lovenox for 4 flying to the [place].  She was briefly admitted to a hospital in [place] several days ago where she was seen by orthopedic surgery and discharged home with plans for outpatient follow-up.  She has since come to [place], continues to use oxycodone and gabapentin, but has been experiencing increasing pain in the right hip.  She denied any fevers, chills, chest pain, cough, or shortness of breath.  Interim history Fracture and intractable pain.  Hospitalization complicated by postoperative anemia, hypertension, hyperkalemia and now urinary retention. 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  Was unable to walk since 2011 and has been in a wheelchair. -Presented with increasing pain -Has been taking oxycodone and gabapentin at home -X-ray showed right femoral neck fracture with varus angulation -Orthopedic surgery consulted and appreciated status post right hip Girdlestone procedure on 03/04/2020 -Continue pain control as necessary -Patient to follow-up with Dr. Erlinda Hong in 2 weeks with suture removal in 6  weeks. -Orthopedics also recommending continuation of splint and elevation of the right lower extremity.  Nonweightbearing to right lower extremity.  Acute urinary retention -Bladder scan showed retention, I/O could not be placed -Urology consulted and appreciated for placement of foley -Catheter back with bloody urine -UA and culture unremarkable for infection -Continue flomax -will place on flomax   Acute metabolic encephalopathy -Possibly secondary to recent surgery vs medications -UA and culture unremarkable for infection -patient not allowing for labs today -will order CT head if she allows for that -Pending ammonia, B12, TSH   Anemia, postoperative  -No overt bleeding noted -Hemoglobin had dropped to 6.9 -Patient received blood transfusion, hemoglobin currently 11.1 -Pending morning labs, patient refusing  Hypotension -Patient received IV fluids and 1 dose of IV albumin on 03/07/2020 -BP stable -Will continue to monitor and treat appropriately  Hyperkalemia -Potassium was up to 6.4, down to 5.7 today -We will give dose of Lokelma and monitor BMP -She was given calcium gluconate, IV insulin and dextrose along with Kayexalate -Pending morning labs, patient is refused  Chronic kidney disease, stage IIIb -Appears to be at baseline, continue to monitor BMP-pending labs today  Diabetes mellitus, type II -Hemoglobin A1c is 5.4 on 02/28/20 (per Care Everywhere) -Continue Lantus, insulin sliding scale and CBG monitoring  Bilateral lower extremity edema -Patient with pitting lower extremity edema and bullae with serous weeping on presentation -Suspect third spacing likely secondary to hypoalbuminemia/malnutrition -No history of congestive heart failure -Patient is bedbound at baseline and uses a wheelchair -BNP within normal limits -Echocardiogram showed preserved LV function  Protein calorie malnutrition -Nutrition consulted and appreciated, continue  supplements  Constipation -Continue  bowel regimen -Patient able to have bowel movement 4/28  DVT Prophylaxis  Lovenox  Code Status: Full  Family Communication: None at bedside. Daughter via phone  Disposition Plan:  Status is: Inpatient  Remains inpatient appropriate because: Patient level of care appropriate due to severity of illness and status post surgery with hypotension, hypokalemia and anemia. Now with urinary retention.   Dispo: The patient is from: Home              Anticipated d/c is to: Home w/ [facility]              Anticipated d/c date is: 2 days              Patient currently is not medically stable to d/c.   Consultants Orthopedics  Procedures  Right hip Girdlestone procedure by orthopedics on 03/05/2020 Right leg splint 03/05/2020  Antibiotics   Anti-infectives (From admission, onward)   Start     Dose/Rate Route Frequency Ordered Stop   03/09/20 1030  ciprofloxacin (CIPRO) tablet 250 mg     250 mg Oral Daily with breakfast 03/09/20 1020 03/15/20 0759   03/08/20 2000  ciprofloxacin (CIPRO) tablet 250 mg  Status:  Discontinued     250 mg Oral 2 times daily 03/08/20 1753 03/09/20 1020   03/05/20 2100  ceFAZolin (ANCEF) IVPB 2g/100 mL premix     2 g 200 mL/hr over 30 Minutes Intravenous Every 8 hours 03/05/20 2043 03/06/20 0547   03/05/20 0818  vancomycin (VANCOCIN) powder  Status:  Discontinued       As needed 03/05/20 0819 03/05/20 0913   03/05/20 0600  ceFAZolin (ANCEF) IVPB 2g/100 mL premix  Status:  Discontinued     2 g 200 mL/hr over 30 Minutes Intravenous On call to O.R. 03/04/20 1713 03/05/20 1055      Subjective:   [name] seen and examined today.  No new complaints today.  Patient not speaking with me.  Appears to be confused.  Objective:   Vitals:   03/08/20 1333 03/08/20 2050 03/09/20 0314 03/09/20 0822  BP: (!) 126/59 (!) 115/58 (!) 118/59 (!) 111/50  Pulse: 89 (!) 103 99 93  Resp: 15 16 16 17   Temp: 98.4 F (36.9 C) 98.6 F (37 C)  98.2 F (36.8 C) 98.5 F (36.9 C)  TempSrc: Oral Oral Oral Oral  SpO2: 92% 100% 100% 100%  Weight:      Height:        Intake/Output Summary (Last 24 hours) at 03/09/2020 1144 Last data filed at 03/09/2020 0900 Gross per 24 hour  Intake 880 ml  Output 1400 ml  Net -520 ml   Filed Weights   03/03/20 0421 03/03/20 0838  Weight: 79.7 kg 79.7 kg   Exam  General: Well developed, well nourished, elderly  HEENT: NCAT, mucous membranes moist.   Cardiovascular: S1 S2 auscultated, RRR  Respiratory: Clear to auscultation bilaterally   Abdomen: Soft, nontender, nondistended, + bowel sounds  Extremities: B/L LE edema, RLE ext in splint  Neuro: Awake and alert, not speaking with me today  Psych: Appears confused   Data Reviewed: I have personally reviewed following labs and imaging studies  CBC: Recent Labs  Lab 03/02/20 2117 03/03/20 0402 03/04/20 0504 03/05/20 1524 03/06/20 0423 03/07/20 0425 03/08/20 0154  WBC 6.2   < > 4.6 10.9* 10.2 7.4 10.6*  NEUTROABS 4.1  --   --   --   --   --   --   HGB 11.0*   < >  9.6* 8.6* 8.1* 6.9* 11.1*  HCT 36.5   < > 31.9* 28.1* 27.0* 23.0* 35.0*  MCV 99.7   < > 100.0 98.9 100.0 101.3* 97.8  PLT 201   < > 162 139* 136* 132* 149*   < > = values in this interval not displayed.   Basic Metabolic Panel: Recent Labs  Lab 03/04/20 0504 03/04/20 0504 03/05/20 1524 03/05/20 1524 03/06/20 0423 03/07/20 0425 03/07/20 0746 03/07/20 2204 03/08/20 0154  NA 143   < > 140  --  142 141 141  --  142  K 4.3   < > 4.8   < > 4.9 6.0* 6.4* 6.0* 5.7*  CL 117*   < > 113*  --  115* 115* 120*  --  115*  CO2 22   < > 23  --  21* 21* 17*  --  14*  GLUCOSE 121*   < > 313*  --  249* 85 96  --  104*  BUN 29*   < > 31*  --  32* 37* 37*  --  37*  CREATININE 1.36*   < > 1.35*  --  1.45* 1.66* 1.69*  --  1.70*  CALCIUM 7.6*   < > 7.6*  --  7.5* 7.3* 7.4*  --  7.7*  MG 1.9  --  2.2  --   --  2.3  --   --  2.4   < > = values in this interval not displayed.    GFR: Estimated Creatinine Clearance: 24.9 mL/min (A) (by C-G formula based on SCr of 1.7 mg/dL (H)). Liver Function Tests: Recent Labs  Lab 03/02/20 2117 03/03/20 0402 03/04/20 0504 03/08/20 0154  AST 27 27 35 29  ALT 18 16 17 6   ALKPHOS 321* 282* 260* 206*  BILITOT 0.8 0.4 0.2* 1.1  PROT 6.0* 5.4* 5.0* 5.1*  ALBUMIN 1.8* 1.6* 1.4* 1.7*   No results for input(s): LIPASE, AMYLASE in the last 168 hours. No results for input(s): AMMONIA in the last 168 hours. Coagulation Profile: Recent Labs  Lab 03/02/20 2117  INR 1.2   Cardiac Enzymes: No results for input(s): CKTOTAL, CKMB, CKMBINDEX, TROPONINI in the last 168 hours. BNP (last 3 results) No results for input(s): PROBNP in the last 8760 hours. HbA1C: No results for input(s): HGBA1C in the last 72 hours. CBG: Recent Labs  Lab 03/08/20 0806 03/08/20 1229 03/08/20 1624 03/08/20 2053 03/09/20 0717  GLUCAP 96 140* 142* 146* 144*   Lipid Profile: No results for input(s): CHOL, HDL, LDLCALC, TRIG, CHOLHDL, LDLDIRECT in the last 72 hours. Thyroid Function Tests: No results for input(s): TSH, T4TOTAL, FREET4, T3FREE, THYROIDAB in the last 72 hours. Anemia Panel: No results for input(s): VITAMINB12, FOLATE, FERRITIN, TIBC, IRON, RETICCTPCT in the last 72 hours. Urine analysis:    Component Value Date/Time   COLORURINE AMBER (A) 03/08/2020 1750   APPEARANCEUR CLOUDY (A) 03/08/2020 1750   LABSPEC 1.020 03/08/2020 1750   PHURINE 5.0 03/08/2020 1750   GLUCOSEU NEGATIVE 03/08/2020 1750   HGBUR LARGE (A) 03/08/2020 1750   BILIRUBINUR NEGATIVE 03/08/2020 1750   KETONESUR NEGATIVE 03/08/2020 1750   PROTEINUR 100 (A) 03/08/2020 1750   NITRITE NEGATIVE 03/08/2020 1750   LEUKOCYTESUR NEGATIVE 03/08/2020 1750   Sepsis Labs: @LABRCNTIP (procalcitonin:4,lacticidven:4)  ) Recent Results (from the past 240 hour(s))  Respiratory Panel by RT PCR (Flu A&B, Covid) - Urine, Catheterized     Status: None   Collection Time:  03/02/20 11:41 PM   Specimen: Urine, Catheterized  Result Value Ref Range Status   SARS Coronavirus 2 by RT PCR NEGATIVE NEGATIVE Final    Comment: (NOTE) SARS-CoV-2 target nucleic acids are NOT DETECTED. The SARS-CoV-2 RNA is generally detectable in upper respiratoy specimens during the acute phase of infection. The lowest concentration of SARS-CoV-2 viral copies this assay can detect is 131 copies/mL. A negative result does not preclude SARS-Cov-2 infection and should not be used as the sole basis for treatment or other patient management decisions. A negative result may occur with  improper specimen collection/handling, submission of specimen other than nasopharyngeal swab, presence of viral mutation(s) within the areas targeted by this assay, and inadequate number of viral copies (<131 copies/mL). A negative result must be combined with clinical observations, patient history, and epidemiological information. The expected result is Negative. Fact Sheet for Patients:  PinkCheek.be Fact Sheet for Healthcare Providers:  GravelBags.it This test is not yet ap proved or cleared by the [place] FDA and  has been authorized for detection and/or diagnosis of SARS-CoV-2 by FDA under an Emergency Use Authorization (EUA). This EUA will remain  in effect (meaning this test can be used) for the duration of the COVID-19 declaration under Section 564(b)(1) of the Act, 21 U.S.C. section 360bbb-3(b)(1), unless the authorization is terminated or revoked sooner.    Influenza A by PCR NEGATIVE NEGATIVE Final   Influenza B by PCR NEGATIVE NEGATIVE Final    Comment: (NOTE) The Xpert Xpress SARS-CoV-2/FLU/RSV assay is intended as an aid in  the diagnosis of influenza from Nasopharyngeal swab specimens and  should not be used as a sole basis for treatment. Nasal washings and  aspirates are unacceptable for Xpert Xpress SARS-CoV-2/FLU/RSV   testing. Fact Sheet for Patients: PinkCheek.be Fact Sheet for Healthcare Providers: GravelBags.it This test is not yet approved or cleared by the [place] FDA and  has been authorized for detection and/or diagnosis of SARS-CoV-2 by  FDA under an Emergency Use Authorization (EUA). This EUA will remain  in effect (meaning this test can be used) for the duration of the  Covid-19 declaration under Section 564(b)(1) of the Act, 21  U.S.C. section 360bbb-3(b)(1), unless the authorization is  terminated or revoked. Performed at [facility], [place] [address] [place], [place] [address]   Surgical pcr screen     Status: None   Collection Time: 03/04/20  5:26 PM   Specimen: Nasal Mucosa; Nasal Swab  Result Value Ref Range Status   MRSA, PCR NEGATIVE NEGATIVE Final   Staphylococcus aureus NEGATIVE NEGATIVE Final    Comment: (NOTE) The Xpert SA Assay (FDA approved for NASAL specimens in patients 18 years of age and older), is one component of a comprehensive surveillance program. It is not intended to diagnose infection nor to guide or monitor treatment. Performed at [facility], [place] [address], [place], [place] [address]   Culture, Urine     Status: None   Collection Time: 03/08/20  5:50 PM   Specimen: Urine, Clean Catch  Result Value Ref Range Status   Specimen Description URINE, CLEAN CATCH  Final   Special Requests NONE  Final   Culture   Final    NO GROWTH Performed at [facility], [place] [address] [place], [place] [address]    Report Status 03/09/2020 FINAL  Final      Radiology Studies: No results found.   Scheduled Meds: . bisacodyl  10 mg Rectal Daily  . bupivacaine liposome  20 mL Infiltration Once  .  ciprofloxacin  250 mg Oral Q breakfast  . docusate sodium  100 mg Oral BID  . feeding supplement (ENSURE ENLIVE)  237 mL Oral TID BM  . gabapentin  300 mg Oral TID  . insulin aspart  0-5  Units Subcutaneous QHS  . insulin aspart  0-9 Units Subcutaneous TID WC  . insulin glargine  10 Units Subcutaneous QHS  . multivitamin with minerals  1 tablet Oral Daily  . polyethylene glycol  17 g Oral Daily  . sodium chloride flush  3 mL Intravenous Q12H  . tamsulosin  0.4 mg Oral Daily   Continuous Infusions: . sodium chloride    . sodium chloride Stopped (03/08/20 0932)  . methocarbamol (ROBAXIN) IV       LOS: 6 days   Time Spent in minutes   45 minutes  Vimal Derego D.O. on 03/09/2020 at 11:44 AM  Between 7am to 7pm - Please see pager noted on [email or web address]  After 7pm go to [email or web address]  And look for the night coverage person covering for me after hours  [facility] Office  [phone]

## 2020-03-09 NOTE — Significant Event (Signed)
Notified of Yellow MEWS per Bedside RN.   BP 88/67. Per RN patient with no distress, continued encephalopathy. Noted Blood in urine. CT A/P and Head Pending.   Previous Hemoglobin 10.5. Albumin 1.6. WBC 7.7. Afebrile.  STAT CBC/BMP/LA ordered.  Give 1 Dose of Albumin and Repeat BP   Hayden Pedro, AGACNP-BC Pgr: [phone]

## 2020-03-10 LAB — CBC
HCT: 29.6 % — ABNORMAL LOW (ref 36.0–46.0)
Hemoglobin: 9.1 g/dL — ABNORMAL LOW (ref 12.0–15.0)
MCH: 30.5 pg (ref 26.0–34.0)
MCHC: 30.7 g/dL (ref 30.0–36.0)
MCV: 99.3 fL (ref 80.0–100.0)
Platelets: 127 10*3/uL — ABNORMAL LOW (ref 150–400)
RBC: 2.98 MIL/uL — ABNORMAL LOW (ref 3.87–5.11)
RDW: 15.9 % — ABNORMAL HIGH (ref 11.5–15.5)
WBC: 6.1 10*3/uL (ref 4.0–10.5)
nRBC: 0 % (ref 0.0–0.2)

## 2020-03-10 LAB — COMPREHENSIVE METABOLIC PANEL
ALT: 6 U/L (ref 0–44)
AST: 17 U/L (ref 15–41)
Albumin: 1.4 g/dL — ABNORMAL LOW (ref 3.5–5.0)
Alkaline Phosphatase: 182 U/L — ABNORMAL HIGH (ref 38–126)
Anion gap: 4 — ABNORMAL LOW (ref 5–15)
BUN: 45 mg/dL — ABNORMAL HIGH (ref 8–23)
CO2: 22 mmol/L (ref 22–32)
Calcium: 7.5 mg/dL — ABNORMAL LOW (ref 8.9–10.3)
Chloride: 116 mmol/L — ABNORMAL HIGH (ref 98–111)
Creatinine, Ser: 1.66 mg/dL — ABNORMAL HIGH (ref 0.44–1.00)
GFR calc Af Amer: 33 mL/min — ABNORMAL LOW (ref >60)
GFR calc non Af Amer: 28 mL/min — ABNORMAL LOW (ref >60)
Glucose, Bld: 192 mg/dL — ABNORMAL HIGH (ref 70–99)
Potassium: 4.9 mmol/L (ref 3.5–5.1)
Sodium: 142 mmol/L (ref 135–145)
Total Bilirubin: 0.9 mg/dL (ref 0.3–1.2)
Total Protein: 4.4 g/dL — ABNORMAL LOW (ref 6.5–8.1)

## 2020-03-10 LAB — GLUCOSE, CAPILLARY
Glucose-Capillary: 161 mg/dL — ABNORMAL HIGH (ref 70–99)
Glucose-Capillary: 141 mg/dL — ABNORMAL HIGH (ref 70–99)
Glucose-Capillary: 149 mg/dL — ABNORMAL HIGH (ref 70–99)
Glucose-Capillary: 164 mg/dL — ABNORMAL HIGH (ref 70–99)

## 2020-03-10 LAB — PROCALCITONIN: Procalcitonin: 0.53 ng/mL

## 2020-03-10 MED ORDER — CHLORHEXIDINE GLUCONATE CLOTH 2 % EX PADS
6.0000 | MEDICATED_PAD | Freq: Every day | CUTANEOUS | Status: DC
  Administered 2020-03-10 – 2020-03-24 (×15): 6 via TOPICAL

## 2020-03-10 MED ORDER — LORAZEPAM 2 MG/ML IJ SOLN
1.0000 mg | Freq: Once | INTRAMUSCULAR | Status: DC
  Filled 2020-03-10: qty 1

## 2020-03-10 NOTE — Progress Notes (Signed)
   03/10/20 0000  Assess: MEWS Score  Temp 98.7 F (37.1 C)  BP (!) 97/53  Pulse Rate (!) 105  Resp 16  Level of Consciousness Alert  SpO2 92 %  O2 Device Room Air  Assess: MEWS Score  MEWS Temp 0  MEWS Systolic 1  MEWS Pulse 1  MEWS RR 0  MEWS LOC 0  MEWS Score 2  MEWS Score Color Yellow  Assess: if the MEWS score is Yellow or Red  Were vital signs taken at a resting state? Yes  Focused Assessment Documented focused assessment  Early Detection of Sepsis Score *See Row Information* Low  MEWS guidelines implemented *See Row Information* Yes  Treat  MEWS Interventions Escalated (See documentation below)  Take Vital Signs  Increase Vital Sign Frequency  Yellow: Q 2hr X 2 then Q 4hr X 2, if remains yellow, continue Q 4hrs  Escalate  MEWS: Escalate Yellow: discuss with charge nurse/RN and consider discussing with provider and RRT  Notify: Charge Nurse/RN  Name of Charge Nurse/RN Notified lauren  Date Charge Nurse/RN Notified 03/10/20  Time Charge Nurse/RN Notified 0143  Notify: Provider  Provider Name/Title Beaulah Corin  Date Provider Notified 03/10/20  Time Provider Notified 0145  Notification Type Page  Notification Reason Other (Comment) (mews required)  Response See new orders  Date of Provider Response 03/10/20  Time of Provider Response [phone]  Document  Patient Outcome Stabilized after interventions  Progress note created (see row info) Yes

## 2020-03-10 NOTE — Progress Notes (Addendum)
Been awaiting albumin was not informed need to pick it up. Pt was off unit briefly for CT scan- hospitalist informed new mews alert

## 2020-03-10 NOTE — Progress Notes (Signed)
Physical Therapy Treatment Patient Details Name: Whitney [name] MRN: [id number] DOB: [birth year] Today's Date: 03/10/2020    History of Present Illness Whitney [name] is a 85 y.o. female with medical history significant for insulin-dependent diabetes mellitus, chronic kidney disease stage III, and right hip fracture more than a month ago, presented to the hospital with increasing right hip pain. Patient previously lived in [place] but had since been in [place] where she suffered a right hip fracture more than a month ago.  She was seen in a hospital in [place] where she was treated with some pain medications and Lovenox before flying to the [place].  She was briefly admitted to a hospital in [place] several days ago where she was seen by orthopedic surgery and discharged home with plans for outpatient follow-up.  She has since come to [place], continues to use oxycodone and gabapentin, but has been experiencing increasing pain in the right hip.  Orthopedic surgery was consulted and patient underwent right hip Girdlestone procedure on 03/04/20, during the surgery the patient sustained a spiral fracture of her right distal tibia.    PT Comments    Pt supine in bed on arrival.  She was very resistant to mobility due to fear of pain.  She performed LE exercises, and positioning into supine.  Continue to recommend post acute rehab at d/c as she continues to require +2 total assistance.    Follow Up Recommendations  SNF;Supervision/Assistance - 24 hour     Equipment Recommendations  Wheelchair (measurements PT);Wheelchair cushion (measurements PT);Hospital bed;Other (comment)    Recommendations for Other Services       Precautions / Restrictions Precautions Precautions: Fall Required Braces or Orthoses: Splint/Cast Splint/Cast: R LE Restrictions Weight Bearing Restrictions: Yes RLE Weight Bearing: Non weight bearing LLE Weight Bearing: Weight bearing as tolerated    Mobility  Bed Mobility Overal bed mobility: Needs Assistance             General bed mobility comments: Pt refused to roll or sit edge of bed this session.  She reports,"You aren't in this body I don't want to hurt."  Pt with pitting edema in LLE but would not allow for ROM in RLE.  She lies in bed with hips one way and trunk the opposite.  Attempted to position patient for neutral alignment and she resists.  Pt did require +2 total assistance to boost to [facility] in supine.  Positioning remains limited due to patient's fear of pain with movement.  Transfers                    Ambulation/Gait                 Stairs             Wheelchair Mobility    Modified Rankin (Stroke Patients Only)       Balance                                            Cognition Arousal/Alertness: Awake/alert Behavior During Therapy: WFL for tasks assessed/performed Overall Cognitive Status: Difficult to assess                                 General Comments: does follow basic commands;fear of falling      Exercises  General Exercises - Lower Extremity Ankle Circles/Pumps: AROM;Left;10 reps;Supine Quad Sets: AROM;Both;10 reps;Supine;Limitations Quad Sets Limitations: LLE is IR and difficult to move to neutral however she was contracting her quad. Heel Slides: AAROM;Left;10 reps;Limitations;Supine Heel Slides Limitations: limited ROM Hip ABduction/ADduction: AAROM;Left;10 reps;Supine    General Comments        Pertinent Vitals/Pain Pain Assessment: Faces Faces Pain Scale: Hurts worst Pain Location: LLE with movement Pain Descriptors / Indicators: Grimacing;Guarding;Moaning;Crying;Other (Comment) Pain Intervention(s): Limited activity within patient's tolerance;Repositioned    Home Living                      Prior Function            PT Goals (current goals can now be found in the care plan section) Acute Rehab PT Goals Patient  Stated Goal: pt did not state Potential to Achieve Goals: Fair Progress towards PT goals: Progressing toward goals    Frequency    Min 3X/week      PT Plan Current plan remains appropriate    Co-evaluation              AM-PAC PT "6 Clicks" Mobility   Outcome Measure  Help needed turning from your back to your side while in a flat bed without using bedrails?: Total Help needed moving from lying on your back to sitting on the side of a flat bed without using bedrails?: Total Help needed moving to and from a bed to a chair (including a wheelchair)?: Total Help needed standing up from a chair using your arms (e.g., wheelchair or bedside chair)?: Total Help needed to walk in hospital room?: Total Help needed climbing 3-5 steps with a railing? : Total 6 Click Score: 6    End of Session Equipment Utilized During Treatment: Gait belt Activity Tolerance: Patient limited by pain Patient left: in bed;with family/visitor present;with bed alarm set Nurse Communication: Mobility status PT Visit Diagnosis: Other abnormalities of gait and mobility (R26.89);Muscle weakness (generalized) (M62.81);Pain Pain - Right/Left: Right Pain - part of body: Hip;Leg     Time: 5852-7782 PT Time Calculation (min) (ACUTE ONLY): 23 min  Charges:  $Therapeutic Exercise: 8-22 mins $Therapeutic Activity: 8-22 mins                     Erasmo Leventhal , PTA Acute Rehabilitation Services Pager [phone] Office [phone]     Jakyrah Holladay Eli Hose 03/10/2020, 3:20 PM

## 2020-03-10 NOTE — Progress Notes (Signed)
Retention manage with foley until mobile and then give trial of voiding  Start flomax  See Dr Matilde Sprang as outpatient for cysto CT scan normal

## 2020-03-10 NOTE — Progress Notes (Signed)
   03/09/20 1958  Assess: MEWS Score  Temp 97.8 F (36.6 C)  BP (!) 88/67  Pulse Rate (!) 109  Resp 18  Level of Consciousness Alert  SpO2 92 %  Assess: MEWS Score  MEWS Temp 0  MEWS Systolic 1  MEWS Pulse 1  MEWS RR 0  MEWS LOC 0  MEWS Score 2  MEWS Score Color Yellow  Assess: if the MEWS score is Yellow or Red  Were vital signs taken at a resting state? Yes (Simultaneous filing. User may not have seen previous data.)  Focused Assessment Documented focused assessment  MEWS guidelines implemented *See Row Information* No, altered LOC is baseline  Treat  MEWS Interventions Escalated (See documentation below) (Simultaneous filing. User may not have seen previous data.)  Take Vital Signs  Increase Vital Sign Frequency  Yellow: Q 2hr X 2 then Q 4hr X 2, if remains yellow, continue Q 4hrs (Simultaneous filing. User may not have seen previous data.)  Escalate  MEWS: Escalate Yellow: discuss with charge nurse/RN and consider discussing with provider and RRT (Simultaneous filing. User may not have seen previous data.)  Notify: Charge Nurse/RN  Name of Charge Nurse/RN Notified lauren (Simultaneous filing. User may not have seen previous data.)  Date Charge Nurse/RN Notified 03/09/20 (Simultaneous filing. User may not have seen previous data.)  Time Charge Nurse/RN Notified 2013 (Simultaneous filing. User may not have seen previous data.)  Notify: Provider  Provider Name/Title Dr. Dewaine Oats  Date Provider Notified 03/09/20  Time Provider Notified 2017  Notification Type Page  Notification Reason Other (Comment) (MEWS YELLOW)  Response See new orders  Date of Provider Response 03/09/20  Time of Provider Response 2000  Document  Patient Outcome Stabilized after interventions  Progress note created (see row info) Yes

## 2020-03-10 NOTE — Progress Notes (Signed)
PROGRESS NOTE    Whitney [name]  ZOX:[id number] DOB: 1935/05/27 DOA: 03/02/2020 PCP: Patient, No Pcp Per   Brief Narrative:  HPI on 03/02/2020 by Dr. Christia Reading Opyd Whitney [name] is a 85 y.o. female with medical history significant for insulin-dependent diabetes mellitus, chronic kidney disease stage III, and right hip fracture more than a month ago, now presenting to the emergency department with increasing right hip pain despite taking oxycodone and gabapentin at home.  Patient is accompanied by her daughter who assists with the history.  Patient previously lived in [place] but had since been in [place] where she suffered a right hip fracture more than a month ago.  She was seen in a hospital in [place] where she was treated with some pain medications and Lovenox for 4 flying to the [place].  She was briefly admitted to a hospital in [place] several days ago where she was seen by orthopedic surgery and discharged home with plans for outpatient follow-up.  She has since come to [place], continues to use oxycodone and gabapentin, but has been experiencing increasing pain in the right hip.  She denied any fevers, chills, chest pain, cough, or shortness of breath.  Interim history Fracture and intractable pain.  Hospitalization complicated by postoperative anemia, hypertension, hyperkalemia and now urinary retention. 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  Was unable to walk since 2011 and has been in a wheelchair. -Presented with increasing pain -Has been taking oxycodone and gabapentin at home -X-ray showed right femoral neck fracture with varus angulation -Orthopedic surgery consulted and appreciated status post right hip Girdlestone procedure on 03/04/2020 -Continue pain control as necessary -Patient to follow-up with Dr. Erlinda Hong in 2 weeks with suture removal in 6  weeks. -Orthopedics also recommending continuation of splint and elevation of the right lower extremity.  Nonweightbearing to right lower extremity.  Acute urinary retention -Bladder scan showed retention, I/O could not be placed -Urology consulted and appreciated for placement of foley. Outpatient cysto -Catheter back with bloody urine- likely old blood -UA and culture unremarkable for infection -Continue flomax  Acute metabolic encephalopathy -Possibly secondary to recent surgery vs medications -UA and culture unremarkable for infection -Ammonia 35 (see WNL), vitamin B12 1650, TSH 0.780 -CT head: Chronic small vessel disease without acute intercranial normality -Will discontinue gabapentin -Added low dose seroquel -will discuss with neurology   Anemia, postoperative  -No overt bleeding noted -Hemoglobin had dropped to 6.9 -Patient received blood transfusion  -Hemoglobin currently 9.1 -Continue to monitor CBC  Hypotension -Patient received IV fluids and 1 dose of IV albumin on 03/07/2020 and on 4/29 -BP stable -Will continue to monitor and treat appropriately  Hyperkalemia -Potassium was up to 6.4, down to 5.7 today -We will give dose of Lokelma and monitor BMP -She was given calcium gluconate, IV insulin and dextrose along with Kayexalate -Pending morning labs, patient is refused  Chronic kidney disease, stage IIIb -Appears to be at baseline, continue to monitor BMP -Creatinine 1.66  Diabetes mellitus, type II -Hemoglobin A1c is 5.4 on 02/28/20 (per Care Everywhere) -Continue Lantus, insulin sliding scale and CBG monitoring  Bilateral lower extremity edema -Patient with pitting lower extremity edema and bullae with serous weeping on presentation -Suspect third spacing likely secondary to hypoalbuminemia/malnutrition -No history of congestive heart failure -Patient is bedbound at baseline and uses a wheelchair -BNP within normal limits -Echocardiogram showed  preserved LV  function  Protein calorie malnutrition -Nutrition consulted and appreciated, continue supplements  Constipation -Continue bowel regimen -Patient able to have bowel movement 4/28  DVT Prophylaxis  Lovenox  Code Status: Full  Family Communication: None at bedside. Daughter via phone  Disposition Plan:  Status is: Inpatient  Remains inpatient appropriate because: Patient level of care appropriate due to severity of illness and status post surgery with hypotension, hypokalemia and anemia. Now with urinary retention.   Dispo: The patient is from: Home              Anticipated d/c is to: Home w/ [facility]              Anticipated d/c date is: 2 days              Patient currently is not medically stable to d/c.   Consultants Orthopedics  Procedures  Right hip Girdlestone procedure by orthopedics on 03/05/2020 Right leg splint 03/05/2020  Antibiotics   Anti-infectives (From admission, onward)   Start     Dose/Rate Route Frequency Ordered Stop   03/09/20 1030  ciprofloxacin (CIPRO) tablet 250 mg  Status:  Discontinued     250 mg Oral Daily with breakfast 03/09/20 1020 03/09/20 1200   03/08/20 2000  ciprofloxacin (CIPRO) tablet 250 mg  Status:  Discontinued     250 mg Oral 2 times daily 03/08/20 1753 03/09/20 1020   03/05/20 2100  ceFAZolin (ANCEF) IVPB 2g/100 mL premix     2 g 200 mL/hr over 30 Minutes Intravenous Every 8 hours 03/05/20 2043 03/06/20 0547   03/05/20 0818  vancomycin (VANCOCIN) powder  Status:  Discontinued       As needed 03/05/20 0819 03/05/20 0913   03/05/20 0600  ceFAZolin (ANCEF) IVPB 2g/100 mL premix  Status:  Discontinued     2 g 200 mL/hr over 30 Minutes Intravenous On call to O.R. 03/04/20 1713 03/05/20 1055      Subjective:   Whitney [name] seen and examined today.  No new complaints today.  Patient not speaking with me.  Appears confused.  Objective:   Vitals:   03/10/20 0156 03/10/20 0256 03/10/20 0407 03/10/20 1028  BP: (!) 126/57 (!)  126/57 (!) 108/38 115/61  Pulse: (!) 105 (!) 105 (!) 102 97  Resp: 18 18 18 20   Temp: 98.7 F (37.1 C)  (!) 97.4 F (36.3 C) 97.6 F (36.4 C)  TempSrc:   Oral Axillary  SpO2: 96% 96% 100% 93%  Weight:      Height:        Intake/Output Summary (Last 24 hours) at 03/10/2020 1305 Last data filed at 03/10/2020 0500 Gross per 24 hour  Intake 642.83 ml  Output 1650 ml  Net -1007.17 ml   Filed Weights   03/03/20 0421 03/03/20 0838  Weight: 79.7 kg 79.7 kg   Exam  General: Well developed, well nourished, elderly   HEENT: NCAT,  mucous membranes moist.   Cardiovascular: S1 S2 auscultated, RRR  Respiratory: Clear to auscultation bilaterally  Abdomen: Soft, obese, nontender, nondistended, + bowel sounds, umbilical hernia  Extremities: B/L LE edema, RLE ext in splint, wound on thigh with clear drainage, sutures intact  Neuro: Awake and alert, not speaking to me today  Data Reviewed: I have personally reviewed following labs and imaging studies  CBC: Recent Labs  Lab 03/08/20 0154 03/09/20 1125 03/09/20 1413 03/09/20 2121 03/10/20 0247  WBC 10.6* 8.3 7.7 7.3 6.1  HGB 11.1* 10.2* 10.5* 10.2* 9.1*  HCT 35.0* 33.3*  34.0* 32.5* 29.6*  MCV 97.8 97.9 99.4 97.6 99.3  PLT 149* 142* 134* 142* 449*   Basic Metabolic Panel: Recent Labs  Lab 03/04/20 0504 03/04/20 0504 03/05/20 1524 03/06/20 0423 03/07/20 0425 03/07/20 0425 03/07/20 0746 03/07/20 0746 03/07/20 2204 03/08/20 0154 03/09/20 1125 03/09/20 2121 03/10/20 0247  NA 143   < > 140   < > 141   < > 141  --   --  142 141 141 142  K 4.3   < > 4.8   < > 6.0*   < > 6.4*   < > 6.0* 5.7* 5.5* 5.1 4.9  CL 117*   < > 113*   < > 115*   < > 120*  --   --  115* 113* 116* 116*  CO2 22   < > 23   < > 21*   < > 17*  --   --  14* 21* 21* 22  GLUCOSE 121*   < > 313*   < > 85   < > 96  --   --  104* 189* 175* 192*  BUN 29*   < > 31*   < > 37*   < > 37*  --   --  37* 44* 44* 45*  CREATININE 1.36*   < > 1.35*   < > 1.66*   < >  1.69*  --   --  1.70* 1.88* 1.63* 1.66*  CALCIUM 7.6*   < > 7.6*   < > 7.3*   < > 7.4*  --   --  7.7* 7.6* 7.6* 7.5*  MG 1.9  --  2.2  --  2.3  --   --   --   --  2.4 2.4  --   --    < > = values in this interval not displayed.   GFR: Estimated Creatinine Clearance: 25.5 mL/min (A) (by C-G formula based on SCr of 1.66 mg/dL (H)). Liver Function Tests: Recent Labs  Lab 03/04/20 0504 03/08/20 0154 03/09/20 1125 03/10/20 0247  AST 35 29 21 17   ALT 17 6 <5 6  ALKPHOS 260* 206* 206* 182*  BILITOT 0.2* 1.1 1.1 0.9  PROT 5.0* 5.1* 5.0* 4.4*  ALBUMIN 1.4* 1.7* 1.6* 1.4*   No results for input(s): LIPASE, AMYLASE in the last 168 hours. Recent Labs  Lab 03/09/20 1422  AMMONIA 35   Coagulation Profile: No results for input(s): INR, PROTIME in the last 168 hours. Cardiac Enzymes: No results for input(s): CKTOTAL, CKMB, CKMBINDEX, TROPONINI in the last 168 hours. BNP (last 3 results) No results for input(s): PROBNP in the last 8760 hours. HbA1C: No results for input(s): HGBA1C in the last 72 hours. CBG: Recent Labs  Lab 03/09/20 1140 03/09/20 1647 03/09/20 2059 03/10/20 0945 03/10/20 1250  GLUCAP 161* 187* 151* 161* 141*   Lipid Profile: No results for input(s): CHOL, HDL, LDLCALC, TRIG, CHOLHDL, LDLDIRECT in the last 72 hours. Thyroid Function Tests: Recent Labs    03/09/20 1413  TSH 0.780   Anemia Panel: Recent Labs    03/09/20 1413  VITAMINB12 1,650*   Urine analysis:    Component Value Date/Time   COLORURINE AMBER (A) 03/08/2020 1750   APPEARANCEUR CLOUDY (A) 03/08/2020 1750   LABSPEC 1.020 03/08/2020 1750   PHURINE 5.0 03/08/2020 1750   GLUCOSEU NEGATIVE 03/08/2020 1750   HGBUR LARGE (A) 03/08/2020 1750   BILIRUBINUR NEGATIVE 03/08/2020 1750   KETONESUR NEGATIVE 03/08/2020 1750   PROTEINUR 100 (A) 03/08/2020  Rafael Capo 03/08/2020 1750   LEUKOCYTESUR NEGATIVE 03/08/2020 1750   Sepsis  Labs: @LABRCNTIP (procalcitonin:4,lacticidven:4)  ) Recent Results (from the past 240 hour(s))  Respiratory Panel by RT PCR (Flu A&B, Covid) - Urine, Catheterized     Status: None   Collection Time: 03/02/20 11:41 PM   Specimen: Urine, Catheterized  Result Value Ref Range Status   SARS Coronavirus 2 by RT PCR NEGATIVE NEGATIVE Final    Comment: (NOTE) SARS-CoV-2 target nucleic acids are NOT DETECTED. The SARS-CoV-2 RNA is generally detectable in upper respiratoy specimens during the acute phase of infection. The lowest concentration of SARS-CoV-2 viral copies this assay can detect is 131 copies/mL. A negative result does not preclude SARS-Cov-2 infection and should not be used as the sole basis for treatment or other patient management decisions. A negative result may occur with  improper specimen collection/handling, submission of specimen other than nasopharyngeal swab, presence of viral mutation(s) within the areas targeted by this assay, and inadequate number of viral copies (<131 copies/mL). A negative result must be combined with clinical observations, patient history, and epidemiological information. The expected result is Negative. Fact Sheet for Patients:  PinkCheek.be Fact Sheet for Healthcare Providers:  GravelBags.it This test is not yet ap proved or cleared by the [place] FDA and  has been authorized for detection and/or diagnosis of SARS-CoV-2 by FDA under an Emergency Use Authorization (EUA). This EUA will remain  in effect (meaning this test can be used) for the duration of the COVID-19 declaration under Section 564(b)(1) of the Act, 21 U.S.C. section 360bbb-3(b)(1), unless the authorization is terminated or revoked sooner.    Influenza A by PCR NEGATIVE NEGATIVE Final   Influenza B by PCR NEGATIVE NEGATIVE Final    Comment: (NOTE) The Xpert Xpress SARS-CoV-2/FLU/RSV assay is intended as an aid in   the diagnosis of influenza from Nasopharyngeal swab specimens and  should not be used as a sole basis for treatment. Nasal washings and  aspirates are unacceptable for Xpert Xpress SARS-CoV-2/FLU/RSV  testing. Fact Sheet for Patients: PinkCheek.be Fact Sheet for Healthcare Providers: GravelBags.it This test is not yet approved or cleared by the [place] FDA and  has been authorized for detection and/or diagnosis of SARS-CoV-2 by  FDA under an Emergency Use Authorization (EUA). This EUA will remain  in effect (meaning this test can be used) for the duration of the  Covid-19 declaration under Section 564(b)(1) of the Act, 21  U.S.C. section 360bbb-3(b)(1), unless the authorization is  terminated or revoked. Performed at [facility], [place] [address] [place], [place] [address]   Surgical pcr screen     Status: None   Collection Time: 03/04/20  5:26 PM   Specimen: Nasal Mucosa; Nasal Swab  Result Value Ref Range Status   MRSA, PCR NEGATIVE NEGATIVE Final   Staphylococcus aureus NEGATIVE NEGATIVE Final    Comment: (NOTE) The Xpert SA Assay (FDA approved for NASAL specimens in patients 58 years of age and older), is one component of a comprehensive surveillance program. It is not intended to diagnose infection nor to guide or monitor treatment. Performed at [facility], [place] [address] [place], [place] [address]   Culture, Urine     Status: None   Collection Time: 03/08/20  5:50 PM   Specimen: Urine, Clean Catch  Result Value Ref Range Status   Specimen Description URINE, CLEAN CATCH  Final   Special Requests NONE  Final   Culture  Final    NO GROWTH Performed at [facility], [place] [address] [place], [place] [address]    Report Status 03/09/2020 FINAL  Final      Radiology Studies: CT ABDOMEN PELVIS WO CONTRAST  Result Date: 03/09/2020 CLINICAL DATA:  Hematuria. Altered mental status. EXAM:  CT ABDOMEN AND PELVIS WITHOUT CONTRAST TECHNIQUE: Multidetector CT imaging of the abdomen and pelvis was performed following the standard protocol without IV contrast. COMPARISON:  Right hip CT 03/03/2020 FINDINGS: Lower chest: Small bilateral pleural effusions. Adjacent patchy opacity at the lung bases, obscured by motion. Coronary artery calcifications. Upper normal heart size. Hepatobiliary: Liver appears slightly shrunken and nodular. No obvious focal lesion, however evaluation is limited in the absence of contrast and streak artifact from arms down positioning Ling over the anterior abdomen. Calcified gallstones in the gallbladder. No biliary dilatation. Pancreas: Parenchymal atrophy. No ductal dilatation or inflammation. Spleen: Normal in size. Perisplenic fluid which likely interspersed is between a cleft about the superior spleen. Fluid measures simple density. Adrenals/Urinary Tract: Normal adrenal glands. Bilateral renal parenchymal thinning. No hydronephrosis. 2.6 cm cyst in the mid left kidney. No renal calculi. No evidence of ureteral calculi, distal ureters are obscured by streak artifact from left hip arthroplasty. No obvious solid lesion on noncontrast exam. Urinary bladder is decompressed by Foley catheter. Streak artifact from left hip arthroplasty further limits bladder evaluation. Stomach/Bowel: Bowel evaluation is limited in the absence of contrast, presence of ascites, and motion artifact. The stomach is distended. Mottled density distends the stomach, which is presumably ingested material. No small bowel obstruction. Sigmoid colon is tortuous. Small to moderate volume of colonic stool. Previous rectal stool ball is no longer seen. The appendix is normal. Vascular/Lymphatic: Infrarenal IVC filter in place. Aortic atherosclerosis. No bulky abdominopelvic adenopathy. Reproductive: Uterine calcifications. Limited adnexal assessment given pelvic ascites and streak artifact from left hip  arthroplasty. Other: Small volume of abdominopelvic ascites. Mild generalized edema of intra-abdominal and mesenteric fat. More confluent edema of the subcutaneous fat, confluent in the flanks. Rectus diastasis with small fat containing umbilical hernia. Musculoskeletal: Left hip arthroplasty. There is segment both during the central aspect of the acetabulum. Interval resection of the right femoral head. Right hip joint effusion. Marked bony under mineralization. Remote bilateral sacral insufficiency fractures. Ovoid soft tissue density with calcifications posterior to the left ischium likely sequela of remote injury or fat necrosis. Lumbar compression fractures of L1, L3, L4 and L5, age indeterminate. IMPRESSION: 1. No renal stones or obstructive uropathy. Urinary bladder decompressed by Foley catheter and not well assessed. No explanation for hematuria on noncontrast exam. 2. Liver appears slightly shrunken and nodular, suggesting cirrhosis. 3. Small volume of abdominopelvic ascites. Mild generalized edema of intra-abdominal and mesenteric fat. More confluent edema of the subcutaneous fat, confluent in the flanks. Small bilateral pleural effusions. 4. Cholelithiasis. 5. Interval right Girdlestone procedure with resection of the femoral head. Right hip joint effusion is likely postsurgical. 6. Bones are markedly under mineralized. Remote bilateral sacral insufficiency fractures. Lumbar compression fractures of L1, L3, L4 and L5, age indeterminate. Aortic Atherosclerosis (ICD10-I70.0). Electronically Signed   By: Keith Rake M.D.   On: 03/09/2020 23:12   CT HEAD WO CONTRAST  Result Date: 03/09/2020 CLINICAL DATA:  Encephalopathy EXAM: CT HEAD WITHOUT CONTRAST TECHNIQUE: Contiguous axial images were obtained from the base of the skull through the vertex without intravenous contrast. COMPARISON:  None. FINDINGS: Brain: There is no mass, hemorrhage or extra-axial collection. The size and configuration of the  ventricles and extra-axial CSF spaces are normal. There is hypoattenuation of the white matter, most commonly indicating chronic small vessel disease. Vascular: No abnormal hyperdensity of the major intracranial arteries or dural venous sinuses. No intracranial atherosclerosis. Skull: The visualized skull base, calvarium and extracranial soft tissues are normal. Sinuses/Orbits: No fluid levels or advanced mucosal thickening of the visualized paranasal sinuses. No mastoid or middle ear effusion. The orbits are normal. IMPRESSION: Chronic small vessel disease without acute intracranial abnormality. Electronically Signed   By: Ulyses Jarred M.D.   On: 03/09/2020 23:17   DG CHEST PORT 1 VIEW  Result Date: 03/09/2020 CLINICAL DATA:  Cough and shortness of breath, diabetes mellitus EXAM: PORTABLE CHEST 1 VIEW COMPARISON:  Portable exam 1228 hours without priors for comparison FINDINGS: Enlargement of cardiac silhouette. Atherosclerotic calcification aorta. Mediastinal contours normal. Generally low lung volumes with scattered interstitial infiltrates in both lungs slightly greater on RIGHT, could represent asymmetric pulmonary edema or atypical infection. No pleural effusion or pneumothorax. Bones demineralized with posttraumatic deformity of the proximal LEFT humerus, appears old. IMPRESSION: Enlargement of cardiac silhouette. Scattered interstitial infiltrates bilaterally LEFT greater than RIGHT question multifocal pneumonia versus asymmetric edema. Electronically Signed   By: Lavonia Dana M.D.   On: 03/09/2020 12:39     Scheduled Meds: . bisacodyl  10 mg Rectal Daily  . Chlorhexidine Gluconate Cloth  6 each Topical Daily  . docusate sodium  100 mg Oral BID  . feeding supplement (ENSURE ENLIVE)  237 mL Oral TID BM  . insulin aspart  0-5 Units Subcutaneous QHS  . insulin aspart  0-9 Units Subcutaneous TID WC  . insulin glargine  10 Units Subcutaneous QHS  . multivitamin with minerals  1 tablet Oral Daily   . polyethylene glycol  17 g Oral Daily  . sodium chloride flush  3 mL Intravenous Q12H  . tamsulosin  0.4 mg Oral Daily   Continuous Infusions: . sodium chloride    . sodium chloride Stopped (03/08/20 0736)  . methocarbamol (ROBAXIN) IV       LOS: 7 days   Time Spent in minutes   45 minutes  Whitney [name] D.O. on 03/10/2020 at 1:05 PM  Between 7am to 7pm - Please see pager noted on [email or web address]  After 7pm go to [email or web address]  And look for the night coverage person covering for me after hours  [facility] Office  [phone]

## 2020-03-11 DIAGNOSIS — R4182 Altered mental status, unspecified: Secondary | ICD-10-CM

## 2020-03-11 DIAGNOSIS — R338 Other retention of urine: Secondary | ICD-10-CM

## 2020-03-11 LAB — CBC
HCT: 32.1 % — ABNORMAL LOW (ref 36.0–46.0)
Hemoglobin: 10 g/dL — ABNORMAL LOW (ref 12.0–15.0)
MCH: 30.9 pg (ref 26.0–34.0)
MCHC: 31.2 g/dL (ref 30.0–36.0)
MCV: 99.1 fL (ref 80.0–100.0)
Platelets: 140 10*3/uL — ABNORMAL LOW (ref 150–400)
RBC: 3.24 MIL/uL — ABNORMAL LOW (ref 3.87–5.11)
RDW: 15.9 % — ABNORMAL HIGH (ref 11.5–15.5)
WBC: 5.3 10*3/uL (ref 4.0–10.5)
nRBC: 0.4 % — ABNORMAL HIGH (ref 0.0–0.2)

## 2020-03-11 LAB — BASIC METABOLIC PANEL
Anion gap: 4 — ABNORMAL LOW (ref 5–15)
BUN: 42 mg/dL — ABNORMAL HIGH (ref 8–23)
CO2: 21 mmol/L — ABNORMAL LOW (ref 22–32)
Calcium: 7.6 mg/dL — ABNORMAL LOW (ref 8.9–10.3)
Chloride: 118 mmol/L — ABNORMAL HIGH (ref 98–111)
Creatinine, Ser: 1.43 mg/dL — ABNORMAL HIGH (ref 0.44–1.00)
GFR calc Af Amer: 39 mL/min — ABNORMAL LOW (ref >60)
GFR calc non Af Amer: 34 mL/min — ABNORMAL LOW (ref >60)
Glucose, Bld: 178 mg/dL — ABNORMAL HIGH (ref 70–99)
Potassium: 4.6 mmol/L (ref 3.5–5.1)
Sodium: 143 mmol/L (ref 135–145)

## 2020-03-11 LAB — GLUCOSE, CAPILLARY
Glucose-Capillary: 128 mg/dL — ABNORMAL HIGH (ref 70–99)
Glucose-Capillary: 115 mg/dL — ABNORMAL HIGH (ref 70–99)
Glucose-Capillary: 118 mg/dL — ABNORMAL HIGH (ref 70–99)
Glucose-Capillary: 142 mg/dL — ABNORMAL HIGH (ref 70–99)

## 2020-03-11 LAB — PROCALCITONIN: Procalcitonin: 0.34 ng/mL

## 2020-03-11 NOTE — Progress Notes (Signed)
Urology Consult Progress Note   Interval/Subjective: Afebrile hemodynamically stable Good urine output yesterday Creatinine downtrending to 1.4 from 1.6 No leukocytosis Hemoglobin 10.0 from 9.1 Pain scores 0    Objective: Vital signs in last 24 hours: Temp:  [97.9 F (36.6 C)-98.5 F (36.9 C)] 98.5 F (36.9 C) (05/01 0428) Pulse Rate:  [93-99] 93 (05/01 0428) Resp:  [15-18] 17 (05/01 0428) BP: (103-127)/(43-62) 113/62 (05/01 0428) SpO2:  [92 %-98 %] 95 % (05/01 0428)  Intake/Output from previous day: 04/30 0701 - 05/01 0700 In: 120 [P.O.:120] Out: 775 [Urine:775] Intake/Output this shift: Total I/O In: 120 [P.O.:120] Out: -   Physical Exam:  Abdomen:  Soft, SP region nontender. GU: Foley in place draining clear yellow urine, dark; hand irrigated at bedside with return of only 2 very small specks of clot. Ext: NT, No erythema  Lab Results: Recent Labs    03/09/20 2121 03/10/20 0247 03/11/20 0331  HGB 10.2* 9.1* 10.0*  HCT 32.5* 29.6* 32.1*   Recent Labs    03/10/20 0247 03/11/20 0331  NA 142 143  K 4.9 4.6  CL 116* 118*  CO2 22 21*  GLUCOSE 192* 178*  BUN 45* 42*  CREATININE 1.66* 1.43*  CALCIUM 7.5* 7.6*    Studies/Results: CT ABDOMEN PELVIS WO CONTRAST  Result Date: 03/09/2020 CLINICAL DATA:  Hematuria. Altered mental status. EXAM: CT ABDOMEN AND PELVIS WITHOUT CONTRAST TECHNIQUE: Multidetector CT imaging of the abdomen and pelvis was performed following the standard protocol without IV contrast. COMPARISON:  Right hip CT 03/03/2020 FINDINGS: Lower chest: Small bilateral pleural effusions. Adjacent patchy opacity at the lung bases, obscured by motion. Coronary artery calcifications. Upper normal heart size. Hepatobiliary: Liver appears slightly shrunken and nodular. No obvious focal lesion, however evaluation is limited in the absence of contrast and streak artifact from arms down positioning Ling over the anterior abdomen. Calcified gallstones in the  gallbladder. No biliary dilatation. Pancreas: Parenchymal atrophy. No ductal dilatation or inflammation. Spleen: Normal in size. Perisplenic fluid which likely interspersed is between a cleft about the superior spleen. Fluid measures simple density. Adrenals/Urinary Tract: Normal adrenal glands. Bilateral renal parenchymal thinning. No hydronephrosis. 2.6 cm cyst in the mid left kidney. No renal calculi. No evidence of ureteral calculi, distal ureters are obscured by streak artifact from left hip arthroplasty. No obvious solid lesion on noncontrast exam. Urinary bladder is decompressed by Foley catheter. Streak artifact from left hip arthroplasty further limits bladder evaluation. Stomach/Bowel: Bowel evaluation is limited in the absence of contrast, presence of ascites, and motion artifact. The stomach is distended. Mottled density distends the stomach, which is presumably ingested material. No small bowel obstruction. Sigmoid colon is tortuous. Small to moderate volume of colonic stool. Previous rectal stool ball is no longer seen. The appendix is normal. Vascular/Lymphatic: Infrarenal IVC filter in place. Aortic atherosclerosis. No bulky abdominopelvic adenopathy. Reproductive: Uterine calcifications. Limited adnexal assessment given pelvic ascites and streak artifact from left hip arthroplasty. Other: Small volume of abdominopelvic ascites. Mild generalized edema of intra-abdominal and mesenteric fat. More confluent edema of the subcutaneous fat, confluent in the flanks. Rectus diastasis with small fat containing umbilical hernia. Musculoskeletal: Left hip arthroplasty. There is segment both during the central aspect of the acetabulum. Interval resection of the right femoral head. Right hip joint effusion. Marked bony under mineralization. Remote bilateral sacral insufficiency fractures. Ovoid soft tissue density with calcifications posterior to the left ischium likely sequela of remote injury or fat necrosis.  Lumbar compression fractures of L1, L3, L4 and  L5, age indeterminate. IMPRESSION: 1. No renal stones or obstructive uropathy. Urinary bladder decompressed by Foley catheter and not well assessed. No explanation for hematuria on noncontrast exam. 2. Liver appears slightly shrunken and nodular, suggesting cirrhosis. 3. Small volume of abdominopelvic ascites. Mild generalized edema of intra-abdominal and mesenteric fat. More confluent edema of the subcutaneous fat, confluent in the flanks. Small bilateral pleural effusions. 4. Cholelithiasis. 5. Interval right Girdlestone procedure with resection of the femoral head. Right hip joint effusion is likely postsurgical. 6. Bones are markedly under mineralized. Remote bilateral sacral insufficiency fractures. Lumbar compression fractures of L1, L3, L4 and L5, age indeterminate. Aortic Atherosclerosis (ICD10-I70.0). Electronically Signed   By: Keith Rake M.D.   On: 03/09/2020 23:12   CT HEAD WO CONTRAST  Result Date: 03/09/2020 CLINICAL DATA:  Encephalopathy EXAM: CT HEAD WITHOUT CONTRAST TECHNIQUE: Contiguous axial images were obtained from the base of the skull through the vertex without intravenous contrast. COMPARISON:  None. FINDINGS: Brain: There is no mass, hemorrhage or extra-axial collection. The size and configuration of the ventricles and extra-axial CSF spaces are normal. There is hypoattenuation of the white matter, most commonly indicating chronic small vessel disease. Vascular: No abnormal hyperdensity of the major intracranial arteries or dural venous sinuses. No intracranial atherosclerosis. Skull: The visualized skull base, calvarium and extracranial soft tissues are normal. Sinuses/Orbits: No fluid levels or advanced mucosal thickening of the visualized paranasal sinuses. No mastoid or middle ear effusion. The orbits are normal. IMPRESSION: Chronic small vessel disease without acute intracranial abnormality. Electronically Signed   By: Ulyses Jarred M.D.   On: 03/09/2020 23:17   DG CHEST PORT 1 VIEW  Result Date: 03/09/2020 CLINICAL DATA:  Cough and shortness of breath, diabetes mellitus EXAM: PORTABLE CHEST 1 VIEW COMPARISON:  Portable exam 1228 hours without priors for comparison FINDINGS: Enlargement of cardiac silhouette. Atherosclerotic calcification aorta. Mediastinal contours normal. Generally low lung volumes with scattered interstitial infiltrates in both lungs slightly greater on RIGHT, could represent asymmetric pulmonary edema or atypical infection. No pleural effusion or pneumothorax. Bones demineralized with posttraumatic deformity of the proximal LEFT humerus, appears old. IMPRESSION: Enlargement of cardiac silhouette. Scattered interstitial infiltrates bilaterally LEFT greater than RIGHT question multifocal pneumonia versus asymmetric edema. Electronically Signed   By: Lavonia Dana M.D.   On: 03/09/2020 12:39    Assessment/Plan:  85 y.o. female with medical history significant for insulin-dependent diabetes mellitus, chronic kidney disease stage III, prior circumcision remotely, and right hip fracture more than a month ago s/p right hip Girdlestone procedure on 03/04/20.  Now with urinary retention; 525ml.  No issue with Foley catheter placement.  Urinary Retention Urinary retention likely from poor bladder contractility in the setting of immobility, polypharmacy, hospitalization.  Interestingly, gross hematuria with Foley catheter placement.  -Continue Foley catheter until well mobilized, then can again attempt trial of void -Flomax 0.4mg  qHS -Reduce/avoid anticholergics, narcotics. Optimize mobility.   Gross Hematuria Gross hematuria with Foley catheter placement. UA with >50RBC/hpf, few bacteria. UClx 03/08/20 was without growth. Unclear etiology of hematuria.  -RN to flush foley prn. -Will require outpatient gross hematuria work-up.   LOS: 8 days

## 2020-03-11 NOTE — Progress Notes (Signed)
PROGRESS NOTE    Whitney [name]  FTD:[id number] DOB: 11-06-1935 DOA: 03/02/2020 PCP: Patient, No Pcp Per   Brief Narrative:  HPI on 03/02/2020 by Dr. Christia Reading Opyd Whitney [name] is a 85 y.o. female with medical history significant for insulin-dependent diabetes mellitus, chronic kidney disease stage III, and right hip fracture more than a month ago, now presenting to the emergency department with increasing right hip pain despite taking oxycodone and gabapentin at home.  Patient is accompanied by her daughter who assists with the history.  Patient previously lived in [place] but had since been in [place] where she suffered a right hip fracture more than a month ago.  She was seen in a hospital in [place] where she was treated with some pain medications and Lovenox for 4 flying to the [place].  She was briefly admitted to a hospital in [place] several days ago where she was seen by orthopedic surgery and discharged home with plans for outpatient follow-up.  She has since come to [place], continues to use oxycodone and gabapentin, but has been experiencing increasing pain in the right hip.  She denied any fevers, chills, chest pain, cough, or shortness of breath.  Interim history Fracture and intractable pain.  Hospitalization complicated by postoperative anemia, hypertension, hyperkalemia and now urinary retention. 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  Was unable to walk since 2011 and has been in a wheelchair. -Presented with increasing pain -Has been taking oxycodone and gabapentin at home -X-ray showed right femoral neck fracture with varus angulation -Orthopedic surgery consulted and appreciated status post right hip Girdlestone procedure on 03/04/2020 -Continue pain control as necessary -Patient to follow-up with Dr. Erlinda Hong in 2 weeks with suture removal in 6  weeks. -Orthopedics also recommending continuation of splint and elevation of the right lower extremity.  Nonweightbearing to right lower extremity.  Acute urinary retention -Bladder scan showed retention, I/O could not be placed -Urology consulted and appreciated for placement of foley. Outpatient cysto -Catheter back with bloody urine- likely old blood -UA and culture unremarkable for infection -Continue flomax  Acute metabolic encephalopathy -Possibly secondary to recent surgery vs medications -UA and culture unremarkable for infection -Ammonia 35 (see WNL), vitamin B12 1650, TSH 0.780 -CT head: Chronic small vessel disease without acute intercranial normality -Discontinued gabapentin -Added low dose seroquel -Discussed with Dr. Lorraine Lax, recommended obtaining MRI and possibly EEG. -With daughter possibly obtaining an MRI, would like to hold off for couple of days.  Anemia, postoperative  -No overt bleeding noted -Hemoglobin had dropped to 6.9 -Patient received blood transfusion  -Hemoglobin currently 10 -Continue to monitor CBC  Hypotension -Patient received IV fluids and 1 dose of IV albumin on 03/07/2020 and on 4/29 -BP stable -Will continue to monitor and treat appropriately  Hyperkalemia -Resolved -Potassium was up to 6.4, down to 4.6 today -Given calcium gluconate, IV insulin and dextrose along with Kayexalate, lokelma -Continue to monitor BMP  Chronic kidney disease, stage IIIb -Appears to be at baseline, continue to monitor BMP -Creatinine 1.43  Diabetes mellitus, type II -Hemoglobin A1c is 5.4 on 02/28/20 (per Care Everywhere) -Continue Lantus, insulin sliding scale and CBG monitoring  Bilateral lower extremity edema -Patient with pitting lower extremity edema and bullae with serous weeping on presentation -Suspect third spacing likely secondary to hypoalbuminemia/malnutrition -No history of congestive heart failure -Patient is bedbound at baseline and uses a  wheelchair -  BNP within normal limits -Echocardiogram showed preserved LV function  Protein calorie malnutrition -Nutrition consulted and appreciated, continue supplements  Constipation -Continue bowel regimen -Patient able to have bowel movement 4/28  DVT Prophylaxis  Lovenox  Code Status: Full  Family Communication: None at bedside.   Disposition Plan:  Status is: Inpatient  Remains inpatient appropriate because: Patient level of care appropriate due to severity of illness and status post surgery with hypotension, hypokalemia and anemia. Now with urinary retention and altered mental status..   Dispo: The patient is from: Home              Anticipated d/c is to: Home w/ [facility]              Anticipated d/c date is: 2 days              Patient currently is not medically stable to d/c.   Consultants Orthopedics  Procedures  Right hip Girdlestone procedure by orthopedics on 03/05/2020 Right leg splint 03/05/2020  Antibiotics   Anti-infectives (From admission, onward)   Start     Dose/Rate Route Frequency Ordered Stop   03/09/20 1030  ciprofloxacin (CIPRO) tablet 250 mg  Status:  Discontinued     250 mg Oral Daily with breakfast 03/09/20 1020 03/09/20 1200   03/08/20 2000  ciprofloxacin (CIPRO) tablet 250 mg  Status:  Discontinued     250 mg Oral 2 times daily 03/08/20 1753 03/09/20 1020   03/05/20 2100  ceFAZolin (ANCEF) IVPB 2g/100 mL premix     2 g 200 mL/hr over 30 Minutes Intravenous Every 8 hours 03/05/20 2043 03/06/20 0547   03/05/20 0818  vancomycin (VANCOCIN) powder  Status:  Discontinued       As needed 03/05/20 0819 03/05/20 0913   03/05/20 0600  ceFAZolin (ANCEF) IVPB 2g/100 mL premix  Status:  Discontinued     2 g 200 mL/hr over 30 Minutes Intravenous On call to O.R. 03/04/20 1713 03/05/20 1055      Subjective:   Whitney [name] seen and examined today.  Patient not very interactive this morning.  Has no new complaints other than pain.  Objective:   Vitals:     03/10/20 1736 03/10/20 2010 03/11/20 0055 03/11/20 0428  BP: (!) 103/50 (!) 107/55 (!) 127/43 113/62  Pulse: 97 99 96 93  Resp: 16 17 15 17   Temp: 98.1 F (36.7 C) 97.9 F (36.6 C) 98.1 F (36.7 C) 98.5 F (36.9 C)  TempSrc: Oral Oral Oral Oral  SpO2: 98% 93% 92% 95%  Weight:      Height:        Intake/Output Summary (Last 24 hours) at 03/11/2020 1339 Last data filed at 03/11/2020 1213 Gross per 24 hour  Intake 360 ml  Output 500 ml  Net -140 ml   Filed Weights   03/03/20 0421 03/03/20 0838  Weight: 79.7 kg 79.7 kg   Exam  General: Well developed, well nourished, NAD, appears stated age  HEENT: NCAT, mucous membranes moist.   Cardiovascular: S1 S2 auscultated, RRR  Respiratory: Clear to auscultation bilaterally, no wheezing  Abdomen: Soft, obese, nontender, nondistended, + bowel sounds, umbilical hernia  Extremities: Bilateral LE edema.  Right LE in splint.  Incision with dressing in place  Neuro: Awake and alert, but not speaking very much.  Nonfocal  Data Reviewed: I have personally reviewed following labs and imaging studies  CBC: Recent Labs  Lab 03/09/20 1125 03/09/20 1413 03/09/20 2121 03/10/20 0247 03/11/20 0331  WBC 8.3  7.7 7.3 6.1 5.3  HGB 10.2* 10.5* 10.2* 9.1* 10.0*  HCT 33.3* 34.0* 32.5* 29.6* 32.1*  MCV 97.9 99.4 97.6 99.3 99.1  PLT 142* 134* 142* 127* 268*   Basic Metabolic Panel: Recent Labs  Lab 03/05/20 1524 03/06/20 0423 03/07/20 0425 03/07/20 0746 03/08/20 0154 03/09/20 1125 03/09/20 2121 03/10/20 0247 03/11/20 0331  NA 140   < > 141   < > 142 141 141 142 143  K 4.8   < > 6.0*   < > 5.7* 5.5* 5.1 4.9 4.6  CL 113*   < > 115*   < > 115* 113* 116* 116* 118*  CO2 23   < > 21*   < > 14* 21* 21* 22 21*  GLUCOSE 313*   < > 85   < > 104* 189* 175* 192* 178*  BUN 31*   < > 37*   < > 37* 44* 44* 45* 42*  CREATININE 1.35*   < > 1.66*   < > 1.70* 1.88* 1.63* 1.66* 1.43*  CALCIUM 7.6*   < > 7.3*   < > 7.7* 7.6* 7.6* 7.5* 7.6*  MG 2.2   --  2.3  --  2.4 2.4  --   --   --    < > = values in this interval not displayed.   GFR: Estimated Creatinine Clearance: 29.6 mL/min (A) (by C-G formula based on SCr of 1.43 mg/dL (H)). Liver Function Tests: Recent Labs  Lab 03/08/20 0154 03/09/20 1125 03/10/20 0247  AST 29 21 17   ALT 6 <5 6  ALKPHOS 206* 206* 182*  BILITOT 1.1 1.1 0.9  PROT 5.1* 5.0* 4.4*  ALBUMIN 1.7* 1.6* 1.4*   No results for input(s): LIPASE, AMYLASE in the last 168 hours. Recent Labs  Lab 03/09/20 1422  AMMONIA 35   Coagulation Profile: No results for input(s): INR, PROTIME in the last 168 hours. Cardiac Enzymes: No results for input(s): CKTOTAL, CKMB, CKMBINDEX, TROPONINI in the last 168 hours. BNP (last 3 results) No results for input(s): PROBNP in the last 8760 hours. HbA1C: No results for input(s): HGBA1C in the last 72 hours. CBG: Recent Labs  Lab 03/10/20 1250 03/10/20 1607 03/10/20 2049 03/11/20 0748 03/11/20 1200  GLUCAP 141* 149* 164* 128* 115*   Lipid Profile: No results for input(s): CHOL, HDL, LDLCALC, TRIG, CHOLHDL, LDLDIRECT in the last 72 hours. Thyroid Function Tests: Recent Labs    03/09/20 1413  TSH 0.780   Anemia Panel: Recent Labs    03/09/20 1413  VITAMINB12 1,650*   Urine analysis:    Component Value Date/Time   COLORURINE AMBER (A) 03/08/2020 1750   APPEARANCEUR CLOUDY (A) 03/08/2020 1750   LABSPEC 1.020 03/08/2020 1750   PHURINE 5.0 03/08/2020 1750   GLUCOSEU NEGATIVE 03/08/2020 1750   HGBUR LARGE (A) 03/08/2020 1750   BILIRUBINUR NEGATIVE 03/08/2020 1750   KETONESUR NEGATIVE 03/08/2020 1750   PROTEINUR 100 (A) 03/08/2020 1750   NITRITE NEGATIVE 03/08/2020 1750   LEUKOCYTESUR NEGATIVE 03/08/2020 1750   Sepsis Labs: @LABRCNTIP (procalcitonin:4,lacticidven:4)  ) Recent Results (from the past 240 hour(s))  Respiratory Panel by RT PCR (Flu A&B, Covid) - Urine, Catheterized     Status: None   Collection Time: 03/02/20 11:41 PM   Specimen: Urine,  Catheterized  Result Value Ref Range Status   SARS Coronavirus 2 by RT PCR NEGATIVE NEGATIVE Final    Comment: (NOTE) SARS-CoV-2 target nucleic acids are NOT DETECTED. The SARS-CoV-2 RNA is generally detectable in upper respiratoy specimens during  the acute phase of infection. The lowest concentration of SARS-CoV-2 viral copies this assay can detect is 131 copies/mL. A negative result does not preclude SARS-Cov-2 infection and should not be used as the sole basis for treatment or other patient management decisions. A negative result may occur with  improper specimen collection/handling, submission of specimen other than nasopharyngeal swab, presence of viral mutation(s) within the areas targeted by this assay, and inadequate number of viral copies (<131 copies/mL). A negative result must be combined with clinical observations, patient history, and epidemiological information. The expected result is Negative. Fact Sheet for Patients:  PinkCheek.be Fact Sheet for Healthcare Providers:  GravelBags.it This test is not yet ap proved or cleared by the [place] FDA and  has been authorized for detection and/or diagnosis of SARS-CoV-2 by FDA under an Emergency Use Authorization (EUA). This EUA will remain  in effect (meaning this test can be used) for the duration of the COVID-19 declaration under Section 564(b)(1) of the Act, 21 U.S.C. section 360bbb-3(b)(1), unless the authorization is terminated or revoked sooner.    Influenza A by PCR NEGATIVE NEGATIVE Final   Influenza B by PCR NEGATIVE NEGATIVE Final    Comment: (NOTE) The Xpert Xpress SARS-CoV-2/FLU/RSV assay is intended as an aid in  the diagnosis of influenza from Nasopharyngeal swab specimens and  should not be used as a sole basis for treatment. Nasal washings and  aspirates are unacceptable for Xpert Xpress SARS-CoV-2/FLU/RSV  testing. Fact Sheet for  Patients: PinkCheek.be Fact Sheet for Healthcare Providers: GravelBags.it This test is not yet approved or cleared by the [place] FDA and  has been authorized for detection and/or diagnosis of SARS-CoV-2 by  FDA under an Emergency Use Authorization (EUA). This EUA will remain  in effect (meaning this test can be used) for the duration of the  Covid-19 declaration under Section 564(b)(1) of the Act, 21  U.S.C. section 360bbb-3(b)(1), unless the authorization is  terminated or revoked. Performed at [facility], [place] [address], [place], [place] [address]   Surgical pcr screen     Status: None   Collection Time: 03/04/20  5:26 PM   Specimen: Nasal Mucosa; Nasal Swab  Result Value Ref Range Status   MRSA, PCR NEGATIVE NEGATIVE Final   Staphylococcus aureus NEGATIVE NEGATIVE Final    Comment: (NOTE) The Xpert SA Assay (FDA approved for NASAL specimens in patients 14 years of age and older), is one component of a comprehensive surveillance program. It is not intended to diagnose infection nor to guide or monitor treatment. Performed at [facility], [place] [address] [place], [place] [address]   Culture, Urine     Status: None   Collection Time: 03/08/20  5:50 PM   Specimen: Urine, Clean Catch  Result Value Ref Range Status   Specimen Description URINE, CLEAN CATCH  Final   Special Requests NONE  Final   Culture   Final    NO GROWTH Performed at [facility], [place] [address] [place], [place] [address]    Report Status 03/09/2020 FINAL  Final      Radiology Studies: CT ABDOMEN PELVIS WO CONTRAST  Result Date: 03/09/2020 CLINICAL DATA:  Hematuria. Altered mental status. EXAM: CT ABDOMEN AND PELVIS WITHOUT CONTRAST TECHNIQUE: Multidetector CT imaging of the abdomen and pelvis was performed following the standard protocol without IV contrast. COMPARISON:  Right hip CT 03/03/2020 FINDINGS: Lower chest:  Small bilateral pleural effusions. Adjacent patchy opacity at the lung bases, obscured  by motion. Coronary artery calcifications. Upper normal heart size. Hepatobiliary: Liver appears slightly shrunken and nodular. No obvious focal lesion, however evaluation is limited in the absence of contrast and streak artifact from arms down positioning Ling over the anterior abdomen. Calcified gallstones in the gallbladder. No biliary dilatation. Pancreas: Parenchymal atrophy. No ductal dilatation or inflammation. Spleen: Normal in size. Perisplenic fluid which likely interspersed is between a cleft about the superior spleen. Fluid measures simple density. Adrenals/Urinary Tract: Normal adrenal glands. Bilateral renal parenchymal thinning. No hydronephrosis. 2.6 cm cyst in the mid left kidney. No renal calculi. No evidence of ureteral calculi, distal ureters are obscured by streak artifact from left hip arthroplasty. No obvious solid lesion on noncontrast exam. Urinary bladder is decompressed by Foley catheter. Streak artifact from left hip arthroplasty further limits bladder evaluation. Stomach/Bowel: Bowel evaluation is limited in the absence of contrast, presence of ascites, and motion artifact. The stomach is distended. Mottled density distends the stomach, which is presumably ingested material. No small bowel obstruction. Sigmoid colon is tortuous. Small to moderate volume of colonic stool. Previous rectal stool ball is no longer seen. The appendix is normal. Vascular/Lymphatic: Infrarenal IVC filter in place. Aortic atherosclerosis. No bulky abdominopelvic adenopathy. Reproductive: Uterine calcifications. Limited adnexal assessment given pelvic ascites and streak artifact from left hip arthroplasty. Other: Small volume of abdominopelvic ascites. Mild generalized edema of intra-abdominal and mesenteric fat. More confluent edema of the subcutaneous fat, confluent in the flanks. Rectus diastasis with small fat containing  umbilical hernia. Musculoskeletal: Left hip arthroplasty. There is segment both during the central aspect of the acetabulum. Interval resection of the right femoral head. Right hip joint effusion. Marked bony under mineralization. Remote bilateral sacral insufficiency fractures. Ovoid soft tissue density with calcifications posterior to the left ischium likely sequela of remote injury or fat necrosis. Lumbar compression fractures of L1, L3, L4 and L5, age indeterminate. IMPRESSION: 1. No renal stones or obstructive uropathy. Urinary bladder decompressed by Foley catheter and not well assessed. No explanation for hematuria on noncontrast exam. 2. Liver appears slightly shrunken and nodular, suggesting cirrhosis. 3. Small volume of abdominopelvic ascites. Mild generalized edema of intra-abdominal and mesenteric fat. More confluent edema of the subcutaneous fat, confluent in the flanks. Small bilateral pleural effusions. 4. Cholelithiasis. 5. Interval right Girdlestone procedure with resection of the femoral head. Right hip joint effusion is likely postsurgical. 6. Bones are markedly under mineralized. Remote bilateral sacral insufficiency fractures. Lumbar compression fractures of L1, L3, L4 and L5, age indeterminate. Aortic Atherosclerosis (ICD10-I70.0). Electronically Signed   By: Keith Rake M.D.   On: 03/09/2020 23:12   CT HEAD WO CONTRAST  Result Date: 03/09/2020 CLINICAL DATA:  Encephalopathy EXAM: CT HEAD WITHOUT CONTRAST TECHNIQUE: Contiguous axial images were obtained from the base of the skull through the vertex without intravenous contrast. COMPARISON:  None. FINDINGS: Brain: There is no mass, hemorrhage or extra-axial collection. The size and configuration of the ventricles and extra-axial CSF spaces are normal. There is hypoattenuation of the white matter, most commonly indicating chronic small vessel disease. Vascular: No abnormal hyperdensity of the major intracranial arteries or dural venous  sinuses. No intracranial atherosclerosis. Skull: The visualized skull base, calvarium and extracranial soft tissues are normal. Sinuses/Orbits: No fluid levels or advanced mucosal thickening of the visualized paranasal sinuses. No mastoid or middle ear effusion. The orbits are normal. IMPRESSION: Chronic small vessel disease without acute intracranial abnormality. Electronically Signed   By: Ulyses Jarred M.D.   On: 03/09/2020 23:17  Scheduled Meds: . bisacodyl  10 mg Rectal Daily  . Chlorhexidine Gluconate Cloth  6 each Topical Daily  . docusate sodium  100 mg Oral BID  . feeding supplement (ENSURE ENLIVE)  237 mL Oral TID BM  . insulin aspart  0-5 Units Subcutaneous QHS  . insulin aspart  0-9 Units Subcutaneous TID WC  . insulin glargine  10 Units Subcutaneous QHS  . LORazepam  1 mg Intravenous Once  . multivitamin with minerals  1 tablet Oral Daily  . polyethylene glycol  17 g Oral Daily  . sodium chloride flush  3 mL Intravenous Q12H  . tamsulosin  0.4 mg Oral Daily   Continuous Infusions: . sodium chloride    . sodium chloride Stopped (03/08/20 0736)  . methocarbamol (ROBAXIN) IV       LOS: 8 days   Time Spent in minutes   30 minutes  Whitney [name] D.O. on 03/11/2020 at 1:39 PM  Between 7am to 7pm - Please see pager noted on [email or web address]  After 7pm go to [email or web address]  And look for the night coverage person covering for me after hours  [facility] Office  [phone]

## 2020-03-12 ENCOUNTER — Encounter (HOSPITAL_COMMUNITY): Payer: Self-pay | Admitting: Family Medicine

## 2020-03-12 LAB — CBC
HCT: 32.6 % — ABNORMAL LOW (ref 36.0–46.0)
Hemoglobin: 10 g/dL — ABNORMAL LOW (ref 12.0–15.0)
MCH: 30.2 pg (ref 26.0–34.0)
MCHC: 30.7 g/dL (ref 30.0–36.0)
MCV: 98.5 fL (ref 80.0–100.0)
Platelets: 130 10*3/uL — ABNORMAL LOW (ref 150–400)
RBC: 3.31 MIL/uL — ABNORMAL LOW (ref 3.87–5.11)
RDW: 16 % — ABNORMAL HIGH (ref 11.5–15.5)
WBC: 5.4 10*3/uL (ref 4.0–10.5)
nRBC: 0 % (ref 0.0–0.2)

## 2020-03-12 LAB — BASIC METABOLIC PANEL
Anion gap: 5 (ref 5–15)
BUN: 40 mg/dL — ABNORMAL HIGH (ref 8–23)
CO2: 23 mmol/L (ref 22–32)
Calcium: 7.7 mg/dL — ABNORMAL LOW (ref 8.9–10.3)
Chloride: 117 mmol/L — ABNORMAL HIGH (ref 98–111)
Creatinine, Ser: 1.35 mg/dL — ABNORMAL HIGH (ref 0.44–1.00)
GFR calc Af Amer: 42 mL/min — ABNORMAL LOW (ref >60)
GFR calc non Af Amer: 36 mL/min — ABNORMAL LOW (ref >60)
Glucose, Bld: 127 mg/dL — ABNORMAL HIGH (ref 70–99)
Potassium: 4.8 mmol/L (ref 3.5–5.1)
Sodium: 145 mmol/L (ref 135–145)

## 2020-03-12 LAB — GLUCOSE, CAPILLARY
Glucose-Capillary: 106 mg/dL — ABNORMAL HIGH (ref 70–99)
Glucose-Capillary: 85 mg/dL (ref 70–99)
Glucose-Capillary: 138 mg/dL — ABNORMAL HIGH (ref 70–99)
Glucose-Capillary: 114 mg/dL — ABNORMAL HIGH (ref 70–99)

## 2020-03-12 NOTE — Progress Notes (Signed)
PROGRESS NOTE    Lanasia Porras  OQH:[id number] DOB: 04/02/1935 DOA: 03/02/2020 PCP: Patient, No Pcp Per   Brief Narrative:  HPI on 03/02/2020 by Dr. Christia Reading Opyd Blue Ruggerio is a 85 y.o. female with medical history significant for insulin-dependent diabetes mellitus, chronic kidney disease stage III, and right hip fracture more than a month ago, now presenting to the emergency department with increasing right hip pain despite taking oxycodone and gabapentin at home.  Patient is accompanied by her daughter who assists with the history.  Patient previously lived in [place] but had since been in [place] where she suffered a right hip fracture more than a month ago.  She was seen in a hospital in [place] where she was treated with some pain medications and Lovenox for 4 flying to the [place].  She was briefly admitted to a hospital in [place] several days ago where she was seen by orthopedic surgery and discharged home with plans for outpatient follow-up.  She has since come to [place], continues to use oxycodone and gabapentin, but has been experiencing increasing pain in the right hip.  She denied any fevers, chills, chest pain, cough, or shortness of breath.  Interim history Fracture and intractable pain.  Hospitalization complicated by postoperative anemia, hypertension, hyperkalemia and now urinary retention and altered mental status. 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  Was unable to walk since 2011 and has been in a wheelchair. -Presented with increasing pain -Has been taking oxycodone and gabapentin at home -X-ray showed right femoral neck fracture with varus angulation -Orthopedic surgery consulted and appreciated status post right hip Girdlestone procedure on 03/04/2020 -Continue pain control as necessary -Patient to follow-up with Dr. Erlinda Hong in 2 weeks with suture  removal in 6 weeks. -Orthopedics also recommending continuation of splint and elevation of the right lower extremity.  Nonweightbearing to right lower extremity.  Acute urinary retention -Bladder scan showed retention, I/O could not be placed -Urology consulted and appreciated for placement of foley. Outpatient cysto -Catheter back with bloody urine- likely old blood -UA and culture unremarkable for infection -Continue flomax  Acute metabolic encephalopathy -Possibly secondary to recent surgery vs medications -UA and culture unremarkable for infection -Ammonia 35 (see WNL), vitamin B12 1650, TSH 0.780 -CT head: Chronic small vessel disease without acute intercranial normality -Discontinued gabapentin -Added low dose seroquel -Discussed with Dr. Lorraine Lax, recommended obtaining MRI and possibly EEG. -Discussed with daughter (on 4/30) possibly obtaining an MRI, would like to hold off for couple of days. -Today, mental status appears to be improved.  Patient alert to self and place and reason for being here.  Anemia, postoperative  -No overt bleeding noted -Hemoglobin had dropped to 6.9 -Patient received blood transfusion  -Hemoglobin currently 10 -Continue to monitor CBC  Hypotension -Patient received IV fluids and 1 dose of IV albumin on 03/07/2020 and on 4/29 -BP stable -Will continue to monitor and treat appropriately  Hyperkalemia -Resolved -Potassium was up to 6.4 -Given calcium gluconate, IV insulin and dextrose along with Kayexalate, lokelma -Continue to monitor BMP  Chronic kidney disease, stage IIIb -Appears to be at baseline, continue to monitor BMP -Creatinine 1.35  Diabetes mellitus, type II -Hemoglobin A1c is 5.4 on 02/28/20 (per Care Everywhere) -Continue Lantus, insulin sliding scale and CBG monitoring  Bilateral lower extremity edema -Patient with pitting lower extremity edema and bullae with serous weeping on presentation -Suspect  third spacing likely  secondary to hypoalbuminemia/malnutrition -No history of congestive heart failure -Patient is bedbound at baseline and uses a wheelchair -BNP within normal limits -Echocardiogram showed preserved LV function  Protein calorie malnutrition -Nutrition consulted and appreciated, continue supplements  Constipation -Continue bowel regimen -Patient able to have bowel movement 4/28  DVT Prophylaxis  Lovenox  Code Status: Full  Family Communication: None at bedside.   Disposition Plan:  Status is: Inpatient  Remains inpatient appropriate because: Patient level of care appropriate due to severity of illness and status post surgery with hypotension, hypokalemia and anemia. Now with urinary retention and altered mental status..   Dispo: The patient is from: Home              Anticipated d/c is to: Home w/ [facility]              Anticipated d/c date is: 2 days              Patient currently is not medically stable to d/c.   Consultants Orthopedics  Procedures  Right hip Girdlestone procedure by orthopedics on 03/05/2020 Right leg splint 03/05/2020  Antibiotics   Anti-infectives (From admission, onward)   Start     Dose/Rate Route Frequency Ordered Stop   03/09/20 1030  ciprofloxacin (CIPRO) tablet 250 mg  Status:  Discontinued     250 mg Oral Daily with breakfast 03/09/20 1020 03/09/20 1200   03/08/20 2000  ciprofloxacin (CIPRO) tablet 250 mg  Status:  Discontinued     250 mg Oral 2 times daily 03/08/20 1753 03/09/20 1020   03/05/20 2100  ceFAZolin (ANCEF) IVPB 2g/100 mL premix     2 g 200 mL/hr over 30 Minutes Intravenous Every 8 hours 03/05/20 2043 03/06/20 0547   03/05/20 0818  vancomycin (VANCOCIN) powder  Status:  Discontinued       As needed 03/05/20 0819 03/05/20 0913   03/05/20 0600  ceFAZolin (ANCEF) IVPB 2g/100 mL premix  Status:  Discontinued     2 g 200 mL/hr over 30 Minutes Intravenous On call to O.R. 03/04/20 1713 03/05/20 1055      Subjective:   [name] seen and  examined today.  Patient more interactive this morning.  Still has complaints of pain in various areas.  Denies current chest pain or shortness of breath, abdominal pain, nausea or vomiting. Objective:   Vitals:   03/11/20 1430 03/11/20 1947 03/12/20 0320 03/12/20 0816  BP: (!) 115/56 (!) 116/51 (!) 107/41 (!) 121/55  Pulse: 96 97 89 97  Resp: 15 16 17 15   Temp: 98.6 F (37 C) 98.2 F (36.8 C) 98 F (36.7 C) 98.2 F (36.8 C)  TempSrc: Oral Oral Oral Oral  SpO2: 95% 94% 95% 100%  Weight:      Height:        Intake/Output Summary (Last 24 hours) at 03/12/2020 1029 Last data filed at 03/12/2020 0900 Gross per 24 hour  Intake 240 ml  Output 350 ml  Net -110 ml   Filed Weights   03/03/20 0421 03/03/20 0838  Weight: 79.7 kg 79.7 kg   Exam  General: Well developed, well nourished, NAD, appears stated age  HEENT: NCAT, mucous membranes moist.   Cardiovascular: S1 S2 auscultated, RRR  Respiratory: Clear to auscultation bilaterally, no wheezing  Abdomen: Soft, nontender, nondistended, + bowel sounds  Extremities: Bilateral LE edema.  Right LE in splint.  Incision with dressing in place.  Neuro: AAOx3, nonfocal  Psych: Appropriate mood and affect, pleasant  Data Reviewed: I have personally reviewed following labs and imaging studies  CBC: Recent Labs  Lab 03/09/20 1413 03/09/20 2121 03/10/20 0247 03/11/20 0331 03/12/20 0620  WBC 7.7 7.3 6.1 5.3 5.4  HGB 10.5* 10.2* 9.1* 10.0* 10.0*  HCT 34.0* 32.5* 29.6* 32.1* 32.6*  MCV 99.4 97.6 99.3 99.1 98.5  PLT 134* 142* 127* 140* 921*   Basic Metabolic Panel: Recent Labs  Lab 03/05/20 1524 03/06/20 0423 03/07/20 0425 03/07/20 0746 03/08/20 0154 03/08/20 0154 03/09/20 1125 03/09/20 2121 03/10/20 0247 03/11/20 0331 03/12/20 0620  NA 140   < > 141   < > 142   < > 141 141 142 143 145  K 4.8   < > 6.0*   < > 5.7*   < > 5.5* 5.1 4.9 4.6 4.8  CL 113*   < > 115*   < > 115*   < > 113* 116* 116* 118* 117*  CO2 23   < >  21*   < > 14*   < > 21* 21* 22 21* 23  GLUCOSE 313*   < > 85   < > 104*   < > 189* 175* 192* 178* 127*  BUN 31*   < > 37*   < > 37*   < > 44* 44* 45* 42* 40*  CREATININE 1.35*   < > 1.66*   < > 1.70*   < > 1.88* 1.63* 1.66* 1.43* 1.35*  CALCIUM 7.6*   < > 7.3*   < > 7.7*   < > 7.6* 7.6* 7.5* 7.6* 7.7*  MG 2.2  --  2.3  --  2.4  --  2.4  --   --   --   --    < > = values in this interval not displayed.   GFR: Estimated Creatinine Clearance: 31.4 mL/min (A) (by C-G formula based on SCr of 1.35 mg/dL (H)). Liver Function Tests: Recent Labs  Lab 03/08/20 0154 03/09/20 1125 03/10/20 0247  AST 29 21 17   ALT 6 <5 6  ALKPHOS 206* 206* 182*  BILITOT 1.1 1.1 0.9  PROT 5.1* 5.0* 4.4*  ALBUMIN 1.7* 1.6* 1.4*   No results for input(s): LIPASE, AMYLASE in the last 168 hours. Recent Labs  Lab 03/09/20 1422  AMMONIA 35   Coagulation Profile: No results for input(s): INR, PROTIME in the last 168 hours. Cardiac Enzymes: No results for input(s): CKTOTAL, CKMB, CKMBINDEX, TROPONINI in the last 168 hours. BNP (last 3 results) No results for input(s): PROBNP in the last 8760 hours. HbA1C: No results for input(s): HGBA1C in the last 72 hours. CBG: Recent Labs  Lab 03/11/20 0748 03/11/20 1200 03/11/20 1650 03/11/20 2018 03/12/20 0732  GLUCAP 128* 115* 118* 142* 106*   Lipid Profile: No results for input(s): CHOL, HDL, LDLCALC, TRIG, CHOLHDL, LDLDIRECT in the last 72 hours. Thyroid Function Tests: Recent Labs    03/09/20 1413  TSH 0.780   Anemia Panel: Recent Labs    03/09/20 1413  VITAMINB12 1,650*   Urine analysis:    Component Value Date/Time   COLORURINE AMBER (A) 03/08/2020 1750   APPEARANCEUR CLOUDY (A) 03/08/2020 1750   LABSPEC 1.020 03/08/2020 1750   PHURINE 5.0 03/08/2020 1750   GLUCOSEU NEGATIVE 03/08/2020 1750   HGBUR LARGE (A) 03/08/2020 1750   BILIRUBINUR NEGATIVE 03/08/2020 1750   KETONESUR NEGATIVE 03/08/2020 1750   PROTEINUR 100 (A) 03/08/2020 1750    NITRITE NEGATIVE 03/08/2020 1750   LEUKOCYTESUR NEGATIVE 03/08/2020 1750   Sepsis Labs: @  LABRCNTIP(procalcitonin:4,lacticidven:4)  ) Recent Results (from the past 240 hour(s))  Respiratory Panel by RT PCR (Flu A&B, Covid) - Urine, Catheterized     Status: None   Collection Time: 03/02/20 11:41 PM   Specimen: Urine, Catheterized  Result Value Ref Range Status   SARS Coronavirus 2 by RT PCR NEGATIVE NEGATIVE Final    Comment: (NOTE) SARS-CoV-2 target nucleic acids are NOT DETECTED. The SARS-CoV-2 RNA is generally detectable in upper respiratoy specimens during the acute phase of infection. The lowest concentration of SARS-CoV-2 viral copies this assay can detect is 131 copies/mL. A negative result does not preclude SARS-Cov-2 infection and should not be used as the sole basis for treatment or other patient management decisions. A negative result may occur with  improper specimen collection/handling, submission of specimen other than nasopharyngeal swab, presence of viral mutation(s) within the areas targeted by this assay, and inadequate number of viral copies (<131 copies/mL). A negative result must be combined with clinical observations, patient history, and epidemiological information. The expected result is Negative. Fact Sheet for Patients:  PinkCheek.be Fact Sheet for Healthcare Providers:  GravelBags.it This test is not yet ap proved or cleared by the [place] FDA and  has been authorized for detection and/or diagnosis of SARS-CoV-2 by FDA under an Emergency Use Authorization (EUA). This EUA will remain  in effect (meaning this test can be used) for the duration of the COVID-19 declaration under Section 564(b)(1) of the Act, 21 U.S.C. section 360bbb-3(b)(1), unless the authorization is terminated or revoked sooner.    Influenza A by PCR NEGATIVE NEGATIVE Final   Influenza B by PCR NEGATIVE NEGATIVE Final     Comment: (NOTE) The Xpert Xpress SARS-CoV-2/FLU/RSV assay is intended as an aid in  the diagnosis of influenza from Nasopharyngeal swab specimens and  should not be used as a sole basis for treatment. Nasal washings and  aspirates are unacceptable for Xpert Xpress SARS-CoV-2/FLU/RSV  testing. Fact Sheet for Patients: PinkCheek.be Fact Sheet for Healthcare Providers: GravelBags.it This test is not yet approved or cleared by the [place] FDA and  has been authorized for detection and/or diagnosis of SARS-CoV-2 by  FDA under an Emergency Use Authorization (EUA). This EUA will remain  in effect (meaning this test can be used) for the duration of the  Covid-19 declaration under Section 564(b)(1) of the Act, 21  U.S.C. section 360bbb-3(b)(1), unless the authorization is  terminated or revoked. Performed at [facility], [place] [address], [place], [place] [address]   Surgical pcr screen     Status: None   Collection Time: 03/04/20  5:26 PM   Specimen: Nasal Mucosa; Nasal Swab  Result Value Ref Range Status   MRSA, PCR NEGATIVE NEGATIVE Final   Staphylococcus aureus NEGATIVE NEGATIVE Final    Comment: (NOTE) The Xpert SA Assay (FDA approved for NASAL specimens in patients 83 years of age and older), is one component of a comprehensive surveillance program. It is not intended to diagnose infection nor to guide or monitor treatment. Performed at [facility], [place] [address], [place], [place] [address]   Culture, Urine     Status: None   Collection Time: 03/08/20  5:50 PM   Specimen: Urine, Clean Catch  Result Value Ref Range Status   Specimen Description URINE, CLEAN CATCH  Final   Special Requests NONE  Final   Culture   Final    NO GROWTH Performed at [facility], [place] [address], [place], [place]  [address]    Report Status 03/09/2020 FINAL  Final      Radiology Studies: No results  found.   Scheduled Meds: . bisacodyl  10 mg Rectal Daily  . Chlorhexidine Gluconate Cloth  6 each Topical Daily  . docusate sodium  100 mg Oral BID  . feeding supplement (ENSURE ENLIVE)  237 mL Oral TID BM  . insulin aspart  0-5 Units Subcutaneous QHS  . insulin aspart  0-9 Units Subcutaneous TID WC  . insulin glargine  10 Units Subcutaneous QHS  . LORazepam  1 mg Intravenous Once  . multivitamin with minerals  1 tablet Oral Daily  . polyethylene glycol  17 g Oral Daily  . sodium chloride flush  3 mL Intravenous Q12H  . tamsulosin  0.4 mg Oral Daily   Continuous Infusions: . sodium chloride    . sodium chloride Stopped (03/08/20 0736)  . methocarbamol (ROBAXIN) IV       LOS: 9 days   Time Spent in minutes   30 minutes  Tinia Oravec D.O. on 03/12/2020 at 10:29 AM  Between 7am to 7pm - Please see pager noted on [email or web address]  After 7pm go to [email or web address]  And look for the night coverage person covering for me after hours  [facility] Office  [phone]

## 2020-03-12 NOTE — Progress Notes (Signed)
Offered suplement drink- she would not drink

## 2020-03-13 LAB — HEMOGLOBIN AND HEMATOCRIT, BLOOD
HCT: 31.7 % — ABNORMAL LOW (ref 36.0–46.0)
Hemoglobin: 9.7 g/dL — ABNORMAL LOW (ref 12.0–15.0)

## 2020-03-13 LAB — RETICULOCYTES
Immature Retic Fract: 27.7 % — ABNORMAL HIGH (ref 2.3–15.9)
RBC.: 3.38 MIL/uL — ABNORMAL LOW (ref 3.87–5.11)
Retic Count, Absolute: 106.5 10*3/uL (ref 19.0–186.0)
Retic Ct Pct: 3.2 % — ABNORMAL HIGH (ref 0.4–3.1)

## 2020-03-13 LAB — GLUCOSE, CAPILLARY
Glucose-Capillary: 110 mg/dL — ABNORMAL HIGH (ref 70–99)
Glucose-Capillary: 117 mg/dL — ABNORMAL HIGH (ref 70–99)
Glucose-Capillary: 109 mg/dL — ABNORMAL HIGH (ref 70–99)
Glucose-Capillary: 114 mg/dL — ABNORMAL HIGH (ref 70–99)

## 2020-03-13 LAB — BASIC METABOLIC PANEL
Anion gap: 4 — ABNORMAL LOW (ref 5–15)
BUN: 36 mg/dL — ABNORMAL HIGH (ref 8–23)
CO2: 24 mmol/L (ref 22–32)
Calcium: 7.5 mg/dL — ABNORMAL LOW (ref 8.9–10.3)
Chloride: 116 mmol/L — ABNORMAL HIGH (ref 98–111)
Creatinine, Ser: 1.36 mg/dL — ABNORMAL HIGH (ref 0.44–1.00)
GFR calc Af Amer: 42 mL/min — ABNORMAL LOW (ref >60)
GFR calc non Af Amer: 36 mL/min — ABNORMAL LOW (ref >60)
Glucose, Bld: 145 mg/dL — ABNORMAL HIGH (ref 70–99)
Potassium: 4.7 mmol/L (ref 3.5–5.1)
Sodium: 144 mmol/L (ref 135–145)

## 2020-03-13 LAB — FOLATE: Folate: 11.7 ng/mL (ref >5.9)

## 2020-03-13 LAB — IRON AND TIBC
Iron: 49 ug/dL (ref 28–170)
Saturation Ratios: 27 % (ref 10.4–31.8)
TIBC: 181 ug/dL — ABNORMAL LOW (ref 250–450)
UIBC: 132 ug/dL

## 2020-03-13 LAB — FERRITIN: Ferritin: 81 ng/mL (ref 11–307)

## 2020-03-13 LAB — VITAMIN B12: Vitamin B-12: 1482 pg/mL — ABNORMAL HIGH (ref 180–914)

## 2020-03-13 NOTE — Plan of Care (Signed)

## 2020-03-13 NOTE — Progress Notes (Signed)
Physical Therapy Treatment Patient Details Name: Whitney [name] MRN: [id number] DOB: February 26, 1935 Today's Date: 03/13/2020    History of Present Illness Whitney [name] is a 85 y.o. female with medical history significant for insulin-dependent diabetes mellitus, chronic kidney disease stage III, and right hip fracture more than a month ago, presented to the hospital with increasing right hip pain. Patient previously lived in [place] but had since been in [place] where she suffered a right hip fracture more than a month ago.  She was seen in a hospital in [place] where she was treated with some pain medications and Lovenox before flying to the [place].  She was briefly admitted to a hospital in [place] several days ago where she was seen by orthopedic surgery and discharged home with plans for outpatient follow-up.  She has since come to [place], continues to use oxycodone and gabapentin, but has been experiencing increasing pain in the right hip.  Orthopedic surgery was consulted and patient underwent right hip Girdlestone procedure on 03/04/20, during the surgery the patient sustained a spiral fracture of her right distal tibia.    PT Comments    Pt has been seen to try to increase active use of LLE and to reposition R leg which is in IR.  Talked with nursing about pt objections to all exercises, and she reports pt has been refusing to be repositioned correctly on RLE and in protective way.  Pt is objecting to LLE work which is not injured.  Family was here but PT did not catch them to help intervene with pt to get her better managed for therapy.  Follow up pt to work on avoiding IR on R hip and trying to increase tolerance for ROM to LLE.   Follow Up Recommendations  SNF     Equipment Recommendations  Wheelchair (measurements PT);Wheelchair cushion (measurements PT);Hospital bed;Other (comment)    Recommendations for Other Services       Precautions / Restrictions  Precautions Precautions: Fall Precaution Comments: monitor posture of R hip on bed Required Braces or Orthoses: Splint/Cast Splint/Cast: R LE Restrictions Weight Bearing Restrictions: Yes RLE Weight Bearing: Non weight bearing LLE Weight Bearing: Weight bearing as tolerated    Mobility  Bed Mobility Overal bed mobility: Needs Assistance Bed Mobility: (scooting up in bed)           General bed mobility comments: pt is unable to assist with scooting up i nbed  Transfers                 General transfer comment: refused to sit up on side of bed, reports she is usually in a wheelchair  Ambulation/Gait             General Gait Details: unable to attempt   Stairs             Wheelchair Mobility    Modified Rankin (Stroke Patients Only)       Balance                                            Cognition Arousal/Alertness: Awake/alert Behavior During Therapy: Anxious;Agitated Overall Cognitive Status: Difficult to assess                                 General Comments: does follow basic commands;fear  of falling      Exercises General Exercises - Lower Extremity Ankle Circles/Pumps: AROM;Left;5 reps Quad Sets: AAROM;10 reps Heel Slides: AAROM;Left Hip ABduction/ADduction: AAROM;Left    General Comments        Pertinent Vitals/Pain Pain Assessment: 0-10 Pain Score: 10-Worst pain ever Pain Location: LLE with movement Pain Descriptors / Indicators: Operative site guarding Pain Intervention(s): Limited activity within patient's tolerance;Monitored during session;Repositioned;Patient requesting pain meds-RN notified;Other (comment)(RN brought Tylenol after session)    Home Living                      Prior Function            PT Goals (current goals can now be found in the care plan section) Progress towards PT goals: Progressing toward goals    Frequency    Min 3X/week      PT Plan  Current plan remains appropriate    Co-evaluation              AM-PAC PT "6 Clicks" Mobility   Outcome Measure  Help needed turning from your back to your side while in a flat bed without using bedrails?: Total Help needed moving from lying on your back to sitting on the side of a flat bed without using bedrails?: Total Help needed moving to and from a bed to a chair (including a wheelchair)?: Total Help needed standing up from a chair using your arms (e.g., wheelchair or bedside chair)?: Total Help needed to walk in hospital room?: Total Help needed climbing 3-5 steps with a railing? : Total 6 Click Score: 6    End of Session   Activity Tolerance: Patient limited by pain Patient left: in bed;with call bell/phone within reach;with bed alarm set;with nursing/sitter in room Nurse Communication: Patient requests pain meds PT Visit Diagnosis: Other abnormalities of gait and mobility (R26.89);Muscle weakness (generalized) (M62.81);Pain Pain - Right/Left: Right Pain - part of body: Hip;Leg     Time: 1130-1153 PT Time Calculation (min) (ACUTE ONLY): 23 min  Charges:  $Therapeutic Exercise: 23-37 mins                Ramond Dial 03/13/2020, 8:16 PM  Mee Hives, PT MS Acute Rehab Dept. Number: [facility]

## 2020-03-13 NOTE — Progress Notes (Signed)
PROGRESS NOTE    Whitney [name]  HUT:[id number] DOB: 30-Jan-1935 DOA: 03/02/2020 PCP: Patient, No Pcp Per   Brief Narrative:  HPI on 03/02/2020 by Dr. Christia Reading Opyd Whitney [name] is a 85 y.o. female with medical history significant for insulin-dependent diabetes mellitus, chronic kidney disease stage III, and right hip fracture more than a month ago, now presenting to the emergency department with increasing right hip pain despite taking oxycodone and gabapentin at home.  Patient is accompanied by her daughter who assists with the history.  Patient previously lived in [place] but had since been in [place] where she suffered a right hip fracture more than a month ago.  She was seen in a hospital in [place] where she was treated with some pain medications and Lovenox for 4 flying to the [place].  She was briefly admitted to a hospital in [place] several days ago where she was seen by orthopedic surgery and discharged home with plans for outpatient follow-up.  She has since come to [place], continues to use oxycodone and gabapentin, but has been experiencing increasing pain in the right hip.  She denied any fevers, chills, chest pain, cough, or shortness of breath.  Interim history Fracture and intractable pain.  Hospitalization complicated by postoperative anemia, hypertension, hyperkalemia and now urinary retention and altered mental status. 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  Was unable to walk since 2011 and has been in a wheelchair. -Presented with increasing pain -Has been taking oxycodone and gabapentin at home -X-ray showed right femoral neck fracture with varus angulation -Orthopedic surgery consulted and appreciated status post right hip Girdlestone procedure on 03/04/2020 -Continue pain control as necessary -Patient to follow-up with Dr. Erlinda Hong in 2 weeks with suture  removal in 6 weeks. -Orthopedics also recommending continuation of splint and elevation of the right lower extremity.  Nonweightbearing to right lower extremity.  Acute urinary retention -Bladder scan showed retention, I/O could not be placed -Urology consulted and appreciated for placement of foley. Outpatient cysto -Catheter back with bloody urine- likely old blood -UA and culture unremarkable for infection -Continue flomax  Acute metabolic encephalopathy -Possibly secondary to recent surgery vs medications -UA and culture unremarkable for infection -Ammonia 35 (see WNL), vitamin B12 1650, TSH 0.780 -CT head: Chronic small vessel disease without acute intercranial normality -Discontinued gabapentin -Discussed with Dr. Lorraine Lax, recommended obtaining MRI and possibly EEG. -Discussed with daughter (on 4/30) possibly obtaining an MRI, would like to hold off for couple of days. -Resolved, continue to monitor  Anemia, postoperative  -No overt bleeding noted -Hemoglobin had dropped to 6.9 -Patient received blood transfusion  -Hemoglobin currently 9.7 -will obtain an anemia panel -Continue to monitor CBC  Hypotension -Patient received IV fluids and 1 dose of IV albumin on 03/07/2020 and on 4/29 -BP stable -Will continue to monitor and treat appropriately  Hyperkalemia -Resolved -Potassium was up to 6.4 -Given calcium gluconate, IV insulin and dextrose along with Kayexalate, lokelma -Continue to monitor BMP  Chronic kidney disease, stage IIIb -Appears to be at baseline, continue to monitor BMP -Creatinine 1.36  Diabetes mellitus, type II -Hemoglobin A1c is 5.4 on 02/28/20 (per Care Everywhere) -Continue Lantus, insulin sliding scale and CBG monitoring  Bilateral lower extremity edema -Patient with pitting lower extremity edema and bullae with serous weeping on presentation -Suspect third spacing likely secondary to hypoalbuminemia/malnutrition -No history of congestive heart  failure -Patient  is bedbound at baseline and uses a wheelchair -BNP within normal limits -Echocardiogram showed preserved LV function  Protein calorie malnutrition -Nutrition consulted and appreciated, continue supplements  Constipation -Continue bowel regimen -Patient able to have bowel movement 4/28  Sore throat -continue cepacol and chloraseptic spray as needed  DVT Prophylaxis  Lovenox  Code Status: Full  Family Communication: None at bedside.   Disposition Plan:  Status is: Inpatient  Remains inpatient appropriate because: Patient level of care appropriate due to severity of illness and status post surgery with hypotension, hypokalemia and anemia. Now with urinary retention and altered mental status..   Dispo: The patient is from: Home              Anticipated d/c is to: Home w/ [facility]              Anticipated d/c date is: 2 days              Patient currently is not medically stable to d/c.   Consultants Orthopedics  Procedures  Right hip Girdlestone procedure by orthopedics on 03/05/2020 Right leg splint 03/05/2020  Antibiotics   Anti-infectives (From admission, onward)   Start     Dose/Rate Route Frequency Ordered Stop   03/09/20 1030  ciprofloxacin (CIPRO) tablet 250 mg  Status:  Discontinued     250 mg Oral Daily with breakfast 03/09/20 1020 03/09/20 1200   03/08/20 2000  ciprofloxacin (CIPRO) tablet 250 mg  Status:  Discontinued     250 mg Oral 2 times daily 03/08/20 1753 03/09/20 1020   03/05/20 2100  ceFAZolin (ANCEF) IVPB 2g/100 mL premix     2 g 200 mL/hr over 30 Minutes Intravenous Every 8 hours 03/05/20 2043 03/06/20 0547   03/05/20 0818  vancomycin (VANCOCIN) powder  Status:  Discontinued       As needed 03/05/20 0819 03/05/20 0913   03/05/20 0600  ceFAZolin (ANCEF) IVPB 2g/100 mL premix  Status:  Discontinued     2 g 200 mL/hr over 30 Minutes Intravenous On call to O.R. 03/04/20 1713 03/05/20 1055      Subjective:   Whitney [name] seen and  examined today.  Patient interactive this morning.  Complains of pain in various areas but overall feels better.  Complains of a sore throat this morning.  Denies current chest pain or shortness of breath, abdominal pain, nausea or vomiting, diarrhea or constipation. Objective:   Vitals:   03/12/20 1353 03/12/20 1948 03/13/20 0335 03/13/20 0830  BP: (!) 113/56 (!) 103/51 108/87 (!) 109/52  Pulse: 93 88 84 86  Resp: 14 16 17 16   Temp: 98.4 F (36.9 C) 98.3 F (36.8 C) 98.5 F (36.9 C) 97.9 F (36.6 C)  TempSrc: Oral Oral Oral Oral  SpO2: 93% 94% 93% 94%  Weight:      Height:        Intake/Output Summary (Last 24 hours) at 03/13/2020 0844 Last data filed at 03/12/2020 1925 Gross per 24 hour  Intake 0 ml  Output 1000 ml  Net -1000 ml   Filed Weights   03/03/20 0421 03/03/20 0838  Weight: 79.7 kg 79.7 kg   Exam  General: Well developed, well nourished, NAD, appears stated age  HEENT: NCAT, , mucous membranes moist.   Cardiovascular: S1 S2 auscultated, RRR  Respiratory: Clear to auscultation bilaterally   Abdomen: Soft, nontender, nondistended, + bowel sounds  Extremities: B/L LE edema. R LE in splint. Incision with dressing on R thigh.   Neuro: AAOx3, nonfocal  Psych: Appropriate mood and affect, pleasant   Data Reviewed: I have personally reviewed following labs and imaging studies  CBC: Recent Labs  Lab 03/09/20 1413 03/09/20 1413 03/09/20 2121 03/10/20 0247 03/11/20 0331 03/12/20 0620 03/13/20 0300  WBC 7.7  --  7.3 6.1 5.3 5.4  --   HGB 10.5*   < > 10.2* 9.1* 10.0* 10.0* 9.7*  HCT 34.0*   < > 32.5* 29.6* 32.1* 32.6* 31.7*  MCV 99.4  --  97.6 99.3 99.1 98.5  --   PLT 134*  --  142* 127* 140* 130*  --    < > = values in this interval not displayed.   Basic Metabolic Panel: Recent Labs  Lab 03/07/20 0425 03/07/20 0746 03/08/20 0154 03/08/20 0154 03/09/20 1125 03/09/20 1125 03/09/20 2121 03/10/20 0247 03/11/20 0331 03/12/20 0620 03/13/20 0300    NA 141   < > 142   < > 141   < > 141 142 143 145 144  K 6.0*   < > 5.7*   < > 5.5*   < > 5.1 4.9 4.6 4.8 4.7  CL 115*   < > 115*   < > 113*   < > 116* 116* 118* 117* 116*  CO2 21*   < > 14*   < > 21*   < > 21* 22 21* 23 24  GLUCOSE 85   < > 104*   < > 189*   < > 175* 192* 178* 127* 145*  BUN 37*   < > 37*   < > 44*   < > 44* 45* 42* 40* 36*  CREATININE 1.66*   < > 1.70*   < > 1.88*   < > 1.63* 1.66* 1.43* 1.35* 1.36*  CALCIUM 7.3*   < > 7.7*   < > 7.6*   < > 7.6* 7.5* 7.6* 7.7* 7.5*  MG 2.3  --  2.4  --  2.4  --   --   --   --   --   --    < > = values in this interval not displayed.   GFR: Estimated Creatinine Clearance: 31.2 mL/min (A) (by C-G formula based on SCr of 1.36 mg/dL (H)). Liver Function Tests: Recent Labs  Lab 03/08/20 0154 03/09/20 1125 03/10/20 0247  AST 29 21 17   ALT 6 <5 6  ALKPHOS 206* 206* 182*  BILITOT 1.1 1.1 0.9  PROT 5.1* 5.0* 4.4*  ALBUMIN 1.7* 1.6* 1.4*   No results for input(s): LIPASE, AMYLASE in the last 168 hours. Recent Labs  Lab 03/09/20 1422  AMMONIA 35   Coagulation Profile: No results for input(s): INR, PROTIME in the last 168 hours. Cardiac Enzymes: No results for input(s): CKTOTAL, CKMB, CKMBINDEX, TROPONINI in the last 168 hours. BNP (last 3 results) No results for input(s): PROBNP in the last 8760 hours. HbA1C: No results for input(s): HGBA1C in the last 72 hours. CBG: Recent Labs  Lab 03/12/20 0732 03/12/20 1203 03/12/20 1718 03/12/20 2023 03/13/20 0736  GLUCAP 106* 85 138* 114* 110*   Lipid Profile: No results for input(s): CHOL, HDL, LDLCALC, TRIG, CHOLHDL, LDLDIRECT in the last 72 hours. Thyroid Function Tests: No results for input(s): TSH, T4TOTAL, FREET4, T3FREE, THYROIDAB in the last 72 hours. Anemia Panel: No results for input(s): VITAMINB12, FOLATE, FERRITIN, TIBC, IRON, RETICCTPCT in the last 72 hours. Urine analysis:    Component Value Date/Time   COLORURINE AMBER (A) 03/08/2020 1750   APPEARANCEUR CLOUDY  (A) 03/08/2020 1750  LABSPEC 1.020 03/08/2020 1750   PHURINE 5.0 03/08/2020 1750   GLUCOSEU NEGATIVE 03/08/2020 1750   HGBUR LARGE (A) 03/08/2020 1750   BILIRUBINUR NEGATIVE 03/08/2020 1750   KETONESUR NEGATIVE 03/08/2020 1750   PROTEINUR 100 (A) 03/08/2020 1750   NITRITE NEGATIVE 03/08/2020 1750   LEUKOCYTESUR NEGATIVE 03/08/2020 1750   Sepsis Labs: @LABRCNTIP (procalcitonin:4,lacticidven:4)  ) Recent Results (from the past 240 hour(s))  Surgical pcr screen     Status: None   Collection Time: 03/04/20  5:26 PM   Specimen: Nasal Mucosa; Nasal Swab  Result Value Ref Range Status   MRSA, PCR NEGATIVE NEGATIVE Final   Staphylococcus aureus NEGATIVE NEGATIVE Final    Comment: (NOTE) The Xpert SA Assay (FDA approved for NASAL specimens in patients 92 years of age and older), is one component of a comprehensive surveillance program. It is not intended to diagnose infection nor to guide or monitor treatment. Performed at [facility], [place] [address] [place], [place] [address]   Culture, Urine     Status: None   Collection Time: 03/08/20  5:50 PM   Specimen: Urine, Clean Catch  Result Value Ref Range Status   Specimen Description URINE, CLEAN CATCH  Final   Special Requests NONE  Final   Culture   Final    NO GROWTH Performed at [facility], [place] [address], [place], [place] [address]    Report Status 03/09/2020 FINAL  Final      Radiology Studies: No results found.   Scheduled Meds: . bisacodyl  10 mg Rectal Daily  . Chlorhexidine Gluconate Cloth  6 each Topical Daily  . docusate sodium  100 mg Oral BID  . feeding supplement (ENSURE ENLIVE)  237 mL Oral TID BM  . insulin aspart  0-5 Units Subcutaneous QHS  . insulin aspart  0-9 Units Subcutaneous TID WC  . insulin glargine  10 Units Subcutaneous QHS  . LORazepam  1 mg Intravenous Once  . multivitamin with minerals  1 tablet Oral Daily  . polyethylene glycol  17 g Oral Daily  . sodium chloride flush  3  mL Intravenous Q12H  . tamsulosin  0.4 mg Oral Daily   Continuous Infusions: . sodium chloride    . sodium chloride Stopped (03/08/20 0736)  . methocarbamol (ROBAXIN) IV       LOS: 10 days   Time Spent in minutes   30 minutes  Marchele Decock D.O. on 03/13/2020 at 8:44 AM  Between 7am to 7pm - Please see pager noted on [email or web address]  After 7pm go to [email or web address]  And look for the night coverage person covering for me after hours  [facility] Office  [phone]

## 2020-03-14 LAB — BASIC METABOLIC PANEL
Anion gap: 3 — ABNORMAL LOW (ref 5–15)
BUN: 33 mg/dL — ABNORMAL HIGH (ref 8–23)
CO2: 23 mmol/L (ref 22–32)
Calcium: 7.6 mg/dL — ABNORMAL LOW (ref 8.9–10.3)
Chloride: 114 mmol/L — ABNORMAL HIGH (ref 98–111)
Creatinine, Ser: 1.19 mg/dL — ABNORMAL HIGH (ref 0.44–1.00)
GFR calc Af Amer: 49 mL/min — ABNORMAL LOW (ref >60)
GFR calc non Af Amer: 42 mL/min — ABNORMAL LOW (ref >60)
Glucose, Bld: 61 mg/dL — ABNORMAL LOW (ref 70–99)
Potassium: 4.5 mmol/L (ref 3.5–5.1)
Sodium: 140 mmol/L (ref 135–145)

## 2020-03-14 LAB — GLUCOSE, CAPILLARY
Glucose-Capillary: 45 mg/dL — ABNORMAL LOW (ref 70–99)
Glucose-Capillary: 50 mg/dL — ABNORMAL LOW (ref 70–99)
Glucose-Capillary: 56 mg/dL — ABNORMAL LOW (ref 70–99)
Glucose-Capillary: 147 mg/dL — ABNORMAL HIGH (ref 70–99)
Glucose-Capillary: 93 mg/dL (ref 70–99)
Glucose-Capillary: 101 mg/dL — ABNORMAL HIGH (ref 70–99)

## 2020-03-14 LAB — HEMOGLOBIN AND HEMATOCRIT, BLOOD
HCT: 30.8 % — ABNORMAL LOW (ref 36.0–46.0)
Hemoglobin: 9.7 g/dL — ABNORMAL LOW (ref 12.0–15.0)

## 2020-03-14 MED ORDER — INSULIN GLARGINE 100 UNIT/ML ~~LOC~~ SOLN
5.0000 [IU] | Freq: Every day | SUBCUTANEOUS | Status: DC
  Administered 2020-03-14 – 2020-03-30 (×17): 5 [IU] via SUBCUTANEOUS
  Filled 2020-03-14 (×17): qty 0.05

## 2020-03-14 MED ORDER — DEXTROSE 50 % IV SOLN
1.0000 | Freq: Once | INTRAVENOUS | Status: AC
  Administered 2020-03-14: 11:00:00 50 mL via INTRAVENOUS
  Filled 2020-03-14: qty 50

## 2020-03-14 NOTE — Progress Notes (Signed)
Inpatient Diabetes Program Recommendations  AACE/ADA: New Consensus Statement on Inpatient Glycemic Control (2015)  Target Ranges:  Prepandial:   less than 140 mg/dL      Peak postprandial:   less than 180 mg/dL (1-2 hours)      Critically ill patients:  140 - 180 mg/dL   Lab Results  Component Value Date   GLUCAP 50 (L) 03/14/2020    Review of Glycemic Control Results for Whitney [name], Whitney [name] (MRN [id number]) as of 03/14/2020 10:12  Ref. Range 03/13/2020 11:43 03/13/2020 16:43 03/13/2020 20:09 03/14/2020 07:59 03/14/2020 08:30  Glucose-Capillary Latest Ref Range: 70 - 99 mg/dL 117 (H) 109 (H) 114 (H) 45 (L) 50 (L)   Diabetes history: DM2  Outpatient Diabetes medications: Lantus 15 units daily + Humalog 3 units TID with meals  Current orders for Inpatient glycemic control:  Lantus 10 units daily + Novolog 0-9 TID + HS  Inpatient Diabetes Program Recommendations:     Hypoglycemic this am @ 45 mg/dl; CBG's running on the lower side overall.  Please consider,  Lantus 5 units daily  Thank you, Reche [name], RN, BSN Diabetes Coordinator Inpatient Diabetes Program [phone] (team pager from 8a-5p)

## 2020-03-14 NOTE — Plan of Care (Addendum)
Mikhail, DO requested RN to clamp foley catheter for 4-5 hours to test to see if pt would have the urge to urinate. Pt did not end up having the urge to urinate during that time so foley was then unclamped. Will continue to monitor.   Problem: Education: Goal: Verbalization of understanding the information provided (i.e., activity precautions, restrictions, etc) will improve Outcome: Progressing Goal: Individualized Educational Video(s) Outcome: Progressing   Problem: Activity: Goal: Ability to ambulate and perform ADLs will improve Outcome: Progressing   Problem: Clinical Measurements: Goal: Postoperative complications will be avoided or minimized Outcome: Progressing   Problem: Self-Concept: Goal: Ability to maintain and perform role responsibilities to the fullest extent possible will improve Outcome: Progressing   Problem: Pain Management: Goal: Pain level will decrease Outcome: Progressing

## 2020-03-14 NOTE — Plan of Care (Signed)
  Problem: Education: Goal: Verbalization of understanding the information provided (i.e., activity precautions, restrictions, etc) will improve Outcome: Progressing Goal: Individualized Educational Video(s) Outcome: Progressing   Problem: Activity: Goal: Ability to ambulate and perform ADLs will improve Outcome: Progressing   Problem: Clinical Measurements: Goal: Postoperative complications will be avoided or minimized Outcome: Progressing   Problem: Pain Management: Goal: Pain level will decrease Outcome: Progressing

## 2020-03-14 NOTE — NC FL2 (Signed)
[place] LEVEL OF CARE SCREENING TOOL     IDENTIFICATION  Patient Name: Whitney [name] Birthdate: 04/14/1935 Sex: female Admission Date (Current Location): 03/02/2020  [facility] and [place] Number:  Herbalist and Address:  [facility], [place] [address] [place], [place] [address]      Provider Number: [id number]  Attending Physician Name and Address:  Cristal Ford, DO  Relative Name and Phone Number:  Milta Deiters [phone]    Current Level of Care: Hospital Recommended Level of Care: [place] Prior Approval Number:    Date Approved/Denied:   PASRR Number: [id number]  Discharge Plan: SNF    Current Diagnoses: Patient Active Problem List   Diagnosis Date Noted  . Closed right hip fracture, initial encounter ([place]) 03/03/2020  . Diabetes mellitus without complication ([place])   . CKD (chronic kidney disease), stage III   . Closed right hip fracture ([place])   . Protein calorie malnutrition ([place])   . Bilateral leg edema   . Intractable pain 03/02/2020    Orientation RESPIRATION BLADDER Height & Weight     Self, Place  Normal Incontinent, Indwelling catheter Weight: 79.7 kg Height:  5\' 2"  (157.5 cm)  BEHAVIORAL SYMPTOMS/MOOD NEUROLOGICAL BOWEL NUTRITION STATUS      Incontinent Diet(see discharge instructions)  AMBULATORY STATUS COMMUNICATION OF NEEDS Skin   Extensive Assist Verbally(Patient speaks Arabic) Surgical wounds, Other (Comment)(Blisters on left lower leg, Surgical incision Right hip, Moisture related perineum)                       Personal Care Assistance Level of Assistance  Bathing, Dressing Bathing Assistance: Maximum assistance   Dressing Assistance: Maximum assistance     Functional Limitations Info  Sight, Hearing, Speech Sight Info: Impaired Hearing Info: Adequate Speech Info: Impaired(Patient speaks Arabic - Will need an interpreter)    SPECIAL CARE FACTORS FREQUENCY   PT (By licensed PT), OT (By licensed OT)     PT Frequency: 5 times per week OT Frequency: 5 times per week            Contractures Contractures Info: Not present    Additional Factors Info  Code Status, Allergies, Insulin Sliding Scale Code Status Info: full code Allergies Info: NKDA   Insulin Sliding Scale Info: sliding scale 3 times daily with meals and at bedtime       Current Medications (03/14/2020):  This is the current hospital active medication list Current Facility-Administered Medications  Medication Dose Route Frequency Provider Last Rate Last Admin  . 0.9 %  sodium chloride infusion  250 mL Intravenous PRN Leandrew Koyanagi, MD      . acetaminophen (TYLENOL) tablet 325-650 mg  325-650 mg Oral Q6H PRN Leandrew Koyanagi, MD      . acetaminophen (TYLENOL) tablet 650 mg  650 mg Oral Q6H PRN Leandrew Koyanagi, MD   650 mg at 03/13/20 2024  . alum & mag hydroxide-simeth (MAALOX/MYLANTA) 200-200-20 MG/5ML suspension 30 mL  30 mL Oral Q4H PRN Leandrew Koyanagi, MD      . bisacodyl (DULCOLAX) suppository 10 mg  10 mg Rectal Daily Pokhrel, Laxman, MD   10 mg at 03/07/20 0900  . Chlorhexidine Gluconate Cloth 2 % PADS 6 each  6 each Topical Daily Cristal Ford, DO   6 each at 03/14/20 0847  . docusate sodium (COLACE) capsule 100 mg  100 mg Oral BID Leandrew Koyanagi, MD   100 mg  at 03/07/20 2253  . HYDROcodone-acetaminophen (NORCO) 7.5-325 MG per tablet 1-2 tablet  1-2 tablet Oral Q4H PRN Leandrew Koyanagi, MD      . HYDROcodone-acetaminophen (NORCO/VICODIN) 5-325 MG per tablet 1-2 tablet  1-2 tablet Oral Q4H PRN Leandrew Koyanagi, MD   1 tablet at 03/11/20 3474  . insulin aspart (novoLOG) injection 0-5 Units  0-5 Units Subcutaneous QHS Leandrew Koyanagi, MD   3 Units at 03/05/20 2109  . insulin aspart (novoLOG) injection 0-9 Units  0-9 Units Subcutaneous TID WC Leandrew Koyanagi, MD   1 Units at 03/12/20 1745  . insulin glargine (LANTUS) injection 5 Units  5 Units Subcutaneous QHS Mikhail, Kimball, DO      .  LORazepam (ATIVAN) injection 1 mg  1 mg Intravenous Once Cristal Ford, DO   Stopped at 03/10/20 1350  . magnesium citrate solution 1 Bottle  1 Bottle Oral Once PRN Leandrew Koyanagi, MD      . menthol-cetylpyridinium (CEPACOL) lozenge 3 mg  1 lozenge Oral PRN Leandrew Koyanagi, MD       Or  . phenol (CHLORASEPTIC) mouth spray 1 spray  1 spray Mouth/Throat PRN Leandrew Koyanagi, MD   1 spray at 03/13/20 0951  . methocarbamol (ROBAXIN) tablet 500 mg  500 mg Oral Q6H PRN Leandrew Koyanagi, MD   500 mg at 03/08/20 1515   Or  . methocarbamol (ROBAXIN) 500 mg in dextrose 5 % 50 mL IVPB  500 mg Intravenous Q6H PRN Leandrew Koyanagi, MD      . morphine 2 MG/ML injection 1 mg  1 mg Intravenous Q2H PRN Leandrew Koyanagi, MD   1 mg at 03/09/20 0458  . multivitamin with minerals tablet 1 tablet  1 tablet Oral Daily Leandrew Koyanagi, MD   1 tablet at 03/13/20 1114  . ondansetron (ZOFRAN) tablet 4 mg  4 mg Oral Q6H PRN Leandrew Koyanagi, MD       Or  . ondansetron [facility]) injection 4 mg  4 mg Intravenous Q6H PRN Leandrew Koyanagi, MD   4 mg at 03/14/20 1204  . polyethylene glycol (MIRALAX / GLYCOLAX) packet 17 g  17 g Oral Daily Leandrew Koyanagi, MD   17 g at 03/07/20 0900  . sodium chloride flush (NS) 0.9 % injection 3 mL  3 mL Intravenous Q12H Leandrew Koyanagi, MD   3 mL at 03/14/20 0849  . sodium chloride flush (NS) 0.9 % injection 3 mL  3 mL Intravenous PRN Leandrew Koyanagi, MD      . sorbitol 70 % solution 30 mL  30 mL Oral Daily PRN Leandrew Koyanagi, MD      . tamsulosin [facility]) capsule 0.4 mg  0.4 mg Oral Daily Cristal Ford, DO   0.4 mg at 03/13/20 1114     Discharge Medications: Please see discharge summary for a list of discharge medications.  Relevant Imaging Results:  Relevant Lab Results:   Additional Information SS # [id number]  Curlene Labrum, RN

## 2020-03-14 NOTE — Progress Notes (Signed)
Occupational Therapy Treatment Patient Details Name: Whitney [name] MRN: [id number] DOB: February 02, 1935 Today's Date: 03/14/2020    History of present illness Whitney [name] is a 85 y.o. female with medical history significant for insulin-dependent diabetes mellitus, chronic kidney disease stage III, and right hip fracture more than a month ago, presented to the hospital with increasing right hip pain. Patient previously lived in [place] but had since been in [place] where she suffered a right hip fracture more than a month ago.  She was seen in a hospital in [place] where she was treated with some pain medications and Lovenox before flying to the [place].  She was briefly admitted to a hospital in [place] several days ago where she was seen by orthopedic surgery and discharged home with plans for outpatient follow-up.  She has since come to [place], continues to use oxycodone and gabapentin, but has been experiencing increasing pain in the right hip.  Orthopedic surgery was consulted and patient underwent right hip Girdlestone procedure on 03/04/20, during the surgery the patient sustained a spiral fracture of her right distal tibia.   OT comments  Pt progressing toward established goals. Pt currently requires maxA+2 for bed mobility, she was more compliant for participation with therapy this session. Pt tolerated sitting EOB about 60minutes, following sitting EOB pt reported need to be cleaned up from BM, pt positioned on bed pan and notified RN and NT. Pt will continue to benefit from skilled OT services to maximize safety and independence with ADL/IADL and functional mobility. Will continue to follow acutely and progress as tolerated.   Virtual interpreter used during session: Dorris Carnes [phone]   Follow Up Recommendations  SNF;Supervision/Assistance - 24 hour    Equipment Recommendations  Hospital bed    Recommendations for Other Services      Precautions / Restrictions  Precautions Precautions: Fall Precaution Comments: monitor posture of R hip on bed Required Braces or Orthoses: Splint/Cast Splint/Cast: R LE Restrictions Weight Bearing Restrictions: Yes RLE Weight Bearing: Non weight bearing LLE Weight Bearing: Weight bearing as tolerated       Mobility Bed Mobility Overal bed mobility: Needs Assistance Bed Mobility: Rolling;Sidelying to Sit;Sit to Sidelying Rolling: +2 for physical assistance;+2 for safety/equipment;Max assist Sidelying to sit: Max assist;+2 for physical assistance;+2 for safety/equipment     Sit to sidelying: +2 for physical assistance;+2 for safety/equipment;Total assist General bed mobility comments: pt initiating some trunk rotation and attempting to pull trunk to upright position with use of bed rails  Transfers                      Balance Overall balance assessment: Needs assistance Sitting-balance support: Bilateral upper extremity supported;Feet supported Sitting balance-Leahy Scale: Poor Sitting balance - Comments: modA for support in sitting;pt able to sit unsupported but due to increased fear of falling, pt requests therapist to provide support;tolerated sitting EOB about 105min this session                                   ADL either performed or assessed with clinical judgement   ADL Overall ADL's : Needs assistance/impaired Eating/Feeding: Minimal assistance;Bed level           Lower Body Bathing: Maximal assistance;Bed level   Upper Body Dressing : Maximal assistance   Lower Body Dressing: Total assistance       Toileting- Clothing Manipulation and Hygiene: Total assistance Toileting -  Clothing Manipulation Details (indicate cue type and reason): pt with BM following sitting EOB required totalA for pericare     Functional mobility during ADLs: Maximal assistance;+2 for physical assistance;+2 for safety/equipment General ADL Comments: pt limited by pain;tolerated sitting  EOB only this date     Vision       Perception     Praxis      Cognition Arousal/Alertness: Awake/alert Behavior During Therapy: Anxious Overall Cognitive Status: Difficult to assess                                 General Comments: does follow basic commands;fear of falling        Exercises     Shoulder Instructions       General Comments pt's daughter present during session;utlized interpreter Samir [phone]    Pertinent Vitals/ Pain       Pain Assessment: Faces Faces Pain Scale: Hurts little more Pain Location: LLE with movement Pain Descriptors / Indicators: Operative site guarding Pain Intervention(s): Limited activity within patient's tolerance;Monitored during session;Repositioned  Home Living                                          Prior Functioning/Environment              Frequency  Min 2X/week        Progress Toward Goals  OT Goals(current goals can now be found in the care plan section)  Progress towards OT goals: Progressing toward goals  Acute Rehab OT Goals Patient Stated Goal: pt did not state OT Goal Formulation: With patient Time For Goal Achievement: 03/20/20 Potential to Achieve Goals: Fair ADL Goals Pt Will Perform Eating: with set-up;bed level;sitting Pt Will Perform Grooming: with min assist;sitting;bed level Pt Will Perform Upper Body Bathing: with set-up;with supervision;sitting;bed level Pt Will Transfer to Toilet: with +2 assist;bedside commode;with transfer board;with mod assist Pt/caregiver will Perform Home Exercise Program: Increased strength;Right Upper extremity;Left upper extremity;With Supervision;With theraband Additional ADL Goal #1: Pt will maintain EOB sitting with min guard assist x 7 mins in prep for functional transfers  Plan Discharge plan remains appropriate    Co-evaluation    PT/OT/SLP Co-Evaluation/Treatment: Yes Reason for Co-Treatment: Complexity of the patient's  impairments (multi-system involvement);For patient/therapist safety;To address functional/ADL transfers   OT goals addressed during session: ADL's and self-care      AM-PAC OT "6 Clicks" Daily Activity     Outcome Measure   Help from another person eating meals?: A Little Help from another person taking care of personal grooming?: A Lot Help from another person toileting, which includes using toliet, bedpan, or urinal?: Total Help from another person bathing (including washing, rinsing, drying)?: A Lot Help from another person to put on and taking off regular upper body clothing?: Total Help from another person to put on and taking off regular lower body clothing?: Total 6 Click Score: 10    End of Session    OT Visit Diagnosis: Pain;Muscle weakness (generalized) (M62.81) Pain - Right/Left: Right Pain - part of body: Hip;Leg   Activity Tolerance Patient limited by pain;Other (comment)(pt limited by fear)   Patient Left in bed;with call bell/phone within reach;with bed alarm set;with family/visitor present(pt left on bed pan)   Nurse Communication Mobility status(pt on bed pan)  Time: 1450-1516 OT Time Calculation (min): 26 min  Charges: OT General Charges $OT Visit: 1 Visit OT Treatments $Self Care/Home Management : 8-22 mins  Helene Kelp OTR/L Acute Rehabilitation Services Office: [place] 03/14/2020, 3:36 PM

## 2020-03-14 NOTE — Progress Notes (Signed)
PROGRESS NOTE    Whitney [name]  NOB:[id number] DOB: 07-May-1935 DOA: 03/02/2020 PCP: Patient, No Pcp Per   Brief Narrative:  HPI on 03/02/2020 by Whitney [name] is a 85 y.o. female with medical history significant for insulin-dependent diabetes mellitus, chronic kidney disease stage III, and right hip fracture more than a month ago, now presenting to the emergency department with increasing right hip pain despite taking oxycodone and gabapentin at home.  Patient is accompanied by her daughter who assists with the history.  Patient previously lived in [place] but had since been in [place] where she suffered a right hip fracture more than a month ago.  She was seen in a hospital in [place] where she was treated with some pain medications and Lovenox for 4 flying to the [place].  She was briefly admitted to a hospital in [place] several days ago where she was seen by orthopedic surgery and discharged home with plans for outpatient follow-up.  She has since come to [place], continues to use oxycodone and gabapentin, but has been experiencing increasing pain in the right hip.  She denied any fevers, chills, chest pain, cough, or shortness of breath.  Interim history Fracture and intractable pain.  Hospitalization complicated by postoperative anemia, hypertension, hyperkalemia and now urinary retention and altered mental status. 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  Was unable to walk since 2011 and has been in a wheelchair. -Presented with increasing pain -Has been taking oxycodone and gabapentin at home -X-ray showed right femoral neck fracture with varus angulation -Orthopedic surgery consulted and appreciated status post right hip Girdlestone procedure on 03/04/2020 -Continue pain control as necessary -Patient to follow-up with Dr. Erlinda [name] in 2 weeks with suture  removal in 6 weeks. -Orthopedics also recommending continuation of splint and elevation of the right lower extremity.  Nonweightbearing to right lower extremity. -PT recommend SNF  Acute urinary retention -Bladder scan showed retention, I/O could not be placed -Urology consulted and appreciated for placement of foley. Outpatient cysto -Catheter bag with bloody urine- likely old blood -UA and culture unremarkable for infection -Continue flomax -will try voiding trial  Acute metabolic encephalopathy -Possibly secondary to recent surgery vs medications -UA and culture unremarkable for infection -Ammonia 35 (see WNL), vitamin B12 1650, TSH 0.780 -CT head: Chronic small vessel disease without acute intercranial normality -Discontinued gabapentin -Discussed with Dr. Lorraine [name], recommended obtaining MRI and possibly EEG. -Discussed with daughter (on 4/30) possibly obtaining an MRI, would like to hold off for couple of days. -Resolved, continue to monitor  Anemia, postoperative  -No overt bleeding noted -Hemoglobin had dropped to 6.9 -Patient received blood transfusion  -Hemoglobin currently 9.7 -will obtain an anemia panel -Continue to monitor CBC  Hypotension -Patient received IV fluids and 1 dose of IV albumin on 03/07/2020 and on 4/29 -BP stable -Will continue to monitor and treat appropriately  Hyperkalemia -Resolved -Potassium was up to 6.4 -Given calcium gluconate, IV insulin and dextrose along with Kayexalate, lokelma -Continue to monitor BMP  Chronic kidney disease, stage IIIb -Appears to be at baseline, continue to monitor BMP -Creatinine 1.19  Diabetes mellitus, type II -Hemoglobin A1c is 5.4 on 02/28/20 (per Care Everywhere) -Continue Lantus, insulin sliding scale and CBG monitoring -patient with hypoglycemic episodes, will decrease lantus to 5u  Bilateral lower extremity edema -Patient with pitting lower extremity edema and bullae with serous weeping on  presentation -Suspect third spacing likely secondary to hypoalbuminemia/malnutrition -No history of congestive heart failure -Patient is bedbound at baseline and uses a wheelchair -BNP within normal limits -Echocardiogram showed preserved LV function  Protein calorie malnutrition -Nutrition consulted and appreciated, continue supplements  Constipation -Continue bowel regimen -Patient able to have bowel movement 4/28   Sore throat -continue cepacol and chloraseptic spray as needed  DVT Prophylaxis  Lovenox  Code Status: Full  Family Communication: None at bedside. Daughter via phone.  Disposition Plan:  Status is: Inpatient  Remains inpatient appropriate because: Patient level of care appropriate due to severity of illness and status post surgery with hypotension, hypokalemia and anemia. Mental status has improved. Will need to do voiding trial  Patient may be a difficult discharge as she does insurance however it will not cover SNF. Patient's daughter lives in a 3rd floor apt which would not be a safe discharge. Discussed with TOC, she has been set up for [facility]   Dispo: The patient is from: Home              Anticipated d/c is to: Home with [facility]              Anticipated d/c date is: 2 days              Patient currently is not medically stable to d/c.   Consultants Orthopedics Urology  Procedures  Echocardiogram Right hip Girdlestone procedure by orthopedics on 03/05/2020 Right leg splint 03/05/2020 Foley catheter placement by urology 03/08/2020  Antibiotics   Anti-infectives (From admission, onward)   Start     Dose/Rate Route Frequency Ordered Stop   03/09/20 1030  ciprofloxacin (CIPRO) tablet 250 mg  Status:  Discontinued     250 mg Oral Daily with breakfast 03/09/20 1020 03/09/20 1200   03/08/20 2000  ciprofloxacin (CIPRO) tablet 250 mg  Status:  Discontinued     250 mg Oral 2 times daily 03/08/20 1753 03/09/20 1020   03/05/20 2100  ceFAZolin (ANCEF) IVPB 2g/100 mL  premix     2 g 200 mL/hr over 30 Minutes Intravenous Every 8 hours 03/05/20 2043 03/06/20 0547   03/05/20 0818  vancomycin (VANCOCIN) powder  Status:  Discontinued       As needed 03/05/20 0819 03/05/20 0913   03/05/20 0600  ceFAZolin (ANCEF) IVPB 2g/100 mL premix  Status:  Discontinued     2 g 200 mL/hr over 30 Minutes Intravenous On call to O.R. 03/04/20 1713 03/05/20 1055      Subjective:   Whitney [name] seen and examined today.  Patient interactive this morning.  Continues to have some sore throat this morning.  Denies current chest pain, shortness of breath, abdominal pain, nausea or vomiting, diarrhea or constipation.   Objective:   Vitals:   03/13/20 1936 03/14/20 0330 03/14/20 0600 03/14/20 0758  BP: (!) 115/53 (!) 95/44 (!) 103/46 126/77  Pulse: 79 75  83  Resp: 16 16  17   Temp: 98.2 F (36.8 C) 98 F (36.7 C)  97.7 F (36.5 C)  TempSrc: Oral Oral  Oral  SpO2: 95% 96%  99%  Weight:      Height:        Intake/Output Summary (Last 24 hours) at 03/14/2020 1046 Last data filed at 03/14/2020 0900 Gross per 24 hour  Intake 303 ml  Output 450 ml  Net -147 ml   Filed Weights   03/03/20 0421 03/03/20 0838  Weight: 79.7 kg 79.7 kg   Exam  General: Well developed, well nourished, NAD, appears stated age  87: NCAT, mucous membranes moist.  Cardiovascular: S1 S2 auscultated, RRR  Respiratory: Clear to auscultation bilaterally with equal chest rise  Abdomen: Soft, nontender, nondistended, + bowel sounds  Extremities: B/L LE edema. RLE in splint. Incision with dressing on R thigh.   Neuro: AAOx3, nonfocal  Psych: Appropriate mood and affect, pleasant  Data Reviewed: I have personally reviewed following labs and imaging studies  CBC: Recent Labs  Lab 03/09/20 1413 03/09/20 1413 03/09/20 2121 03/09/20 2121 03/10/20 0247 03/11/20 0331 03/12/20 0620 03/13/20 0300 03/14/20 0640  WBC 7.7  --  7.3  --  6.1 5.3 5.4  --   --   HGB 10.5*   < > 10.2*   < > 9.1*  10.0* 10.0* 9.7* 9.7*  HCT 34.0*   < > 32.5*   < > 29.6* 32.1* 32.6* 31.7* 30.8*  MCV 99.4  --  97.6  --  99.3 99.1 98.5  --   --   PLT 134*  --  142*  --  127* 140* 130*  --   --    < > = values in this interval not displayed.   Basic Metabolic Panel: Recent Labs  Lab 03/08/20 0154 03/08/20 0154 03/09/20 1125 03/09/20 2121 03/10/20 0247 03/11/20 0331 03/12/20 0620 03/13/20 0300 03/14/20 0640  NA 142   < > 141   < > 142 143 145 144 140  K 5.7*   < > 5.5*   < > 4.9 4.6 4.8 4.7 4.5  CL 115*   < > 113*   < > 116* 118* 117* 116* 114*  CO2 14*   < > 21*   < > 22 21* 23 24 23   GLUCOSE 104*   < > 189*   < > 192* 178* 127* 145* 61*  BUN 37*   < > 44*   < > 45* 42* 40* 36* 33*  CREATININE 1.70*   < > 1.88*   < > 1.66* 1.43* 1.35* 1.36* 1.19*  CALCIUM 7.7*   < > 7.6*   < > 7.5* 7.6* 7.7* 7.5* 7.6*  MG 2.4  --  2.4  --   --   --   --   --   --    < > = values in this interval not displayed.   GFR: Estimated Creatinine Clearance: 35.6 mL/min (A) (by C-G formula based on SCr of 1.19 mg/dL (H)). Liver Function Tests: Recent Labs  Lab 03/08/20 0154 03/09/20 1125 03/10/20 0247  AST 29 21 17   ALT 6 <5 6  ALKPHOS 206* 206* 182*  BILITOT 1.1 1.1 0.9  PROT 5.1* 5.0* 4.4*  ALBUMIN 1.7* 1.6* 1.4*   No results for input(s): LIPASE, AMYLASE in the last 168 hours. Recent Labs  Lab 03/09/20 1422  AMMONIA 35   Coagulation Profile: No results for input(s): INR, PROTIME in the last 168 hours. Cardiac Enzymes: No results for input(s): CKTOTAL, CKMB, CKMBINDEX, TROPONINI in the last 168 hours. BNP (last 3 results) No results for input(s): PROBNP in the last 8760 hours. HbA1C: No results for input(s): HGBA1C in the last 72 hours. CBG: Recent Labs  Lab 03/13/20 1143 03/13/20 1643 03/13/20 2009 03/14/20 0759 03/14/20 0830  GLUCAP 117* 109* 114* 45* 50*   Lipid Profile: No results for input(s): CHOL, HDL, LDLCALC, TRIG, CHOLHDL, LDLDIRECT in the last 72 hours. Thyroid Function  Tests: No results for input(s): TSH, T4TOTAL, FREET4, T3FREE, THYROIDAB in the last 72  hours. Anemia Panel: Recent Labs    03/13/20 1102  VITAMINB12 1,482*  FOLATE 11.7  FERRITIN 81  TIBC 181*  IRON 49  RETICCTPCT 3.2*   Urine analysis:    Component Value Date/Time   COLORURINE AMBER (A) 03/08/2020 1750   APPEARANCEUR CLOUDY (A) 03/08/2020 1750   LABSPEC 1.020 03/08/2020 1750   PHURINE 5.0 03/08/2020 1750   GLUCOSEU NEGATIVE 03/08/2020 1750   HGBUR LARGE (A) 03/08/2020 1750   BILIRUBINUR NEGATIVE 03/08/2020 1750   KETONESUR NEGATIVE 03/08/2020 1750   PROTEINUR 100 (A) 03/08/2020 1750   NITRITE NEGATIVE 03/08/2020 1750   LEUKOCYTESUR NEGATIVE 03/08/2020 1750   Sepsis Labs: @LABRCNTIP (procalcitonin:4,lacticidven:4)  ) Recent Results (from the past 240 hour(s))  Surgical pcr screen     Status: None   Collection Time: 03/04/20  5:26 PM   Specimen: Nasal Mucosa; Nasal Swab  Result Value Ref Range Status   MRSA, PCR NEGATIVE NEGATIVE Final   Staphylococcus aureus NEGATIVE NEGATIVE Final    Comment: (NOTE) The Xpert SA Assay (FDA approved for NASAL specimens in patients 33 years of age and older), is one component of a comprehensive surveillance program. It is not intended to diagnose infection nor to guide or monitor treatment. Performed at [facility], [place] [address], [place], [place] [address]   Culture, Urine     Status: None   Collection Time: 03/08/20  5:50 PM   Specimen: Urine, Clean Catch  Result Value Ref Range Status   Specimen Description URINE, CLEAN CATCH  Final   Special Requests NONE  Final   Culture   Final    NO GROWTH Performed at [facility], [place] [address], [place], [place] [address]    Report Status 03/09/2020 FINAL  Final      Radiology Studies: No results found.   Scheduled Meds: . bisacodyl  10 mg Rectal Daily  . Chlorhexidine Gluconate Cloth  6 each Topical Daily  . dextrose  1 ampule Intravenous Once  . docusate  sodium  100 mg Oral BID  . insulin aspart  0-5 Units Subcutaneous QHS  . insulin aspart  0-9 Units Subcutaneous TID WC  . insulin glargine  5 Units Subcutaneous QHS  . LORazepam  1 mg Intravenous Once  . multivitamin with minerals  1 tablet Oral Daily  . polyethylene glycol  17 g Oral Daily  . sodium chloride flush  3 mL Intravenous Q12H  . tamsulosin  0.4 mg Oral Daily   Continuous Infusions: . sodium chloride    . methocarbamol (ROBAXIN) IV       LOS: 11 days   Time Spent in minutes   30 minutes  Kara Mierzejewski D.O. on 03/14/2020 at 10:46 AM  Between 7am to 7pm - Please see pager noted on [email or web address]  After 7pm go to [email or web address]  And look for the night coverage person covering for me after hours  [facility] Office  [phone]

## 2020-03-14 NOTE — Progress Notes (Addendum)
Hypoglycemic Event  CBG: 45  Treatment: 8 oz OJ  Symptoms: none reported/observed  Follow-up CBG: Time: 0830 CBG Result:50  Possible Reasons for Event: diarrhea/malnutrition  Comments/MD notified:MD paged [phone], D50 to be ordered by MD   Stevan Born

## 2020-03-14 NOTE — Progress Notes (Addendum)
Nutrition Follow-up  DOCUMENTATION CODES:   Obesity unspecified  INTERVENTION:   -D/c Ensure Enlive po TID, each supplement provides 350 kcal and 20 grams of protein -Continue MVI with minerals daily  NUTRITION DIAGNOSIS:   Increased nutrient needs related to wound healing as evidenced by estimated needs.  Ongoing  GOAL:   Patient will meet greater than or equal to 90% of their needs  Progressing   MONITOR:   PO intake, Supplement acceptance, Labs, Weight trends, Skin, I & O's  REASON FOR ASSESSMENT:   Consult Assessment of nutrition requirement/status  ASSESSMENT:   Whitney [name] is a 85 y.o. female with medical history significant for insulin-dependent diabetes mellitus, chronic kidney disease stage III, and right hip fracture more than a month ago, now presenting to the emergency department with increasing right hip pain despite taking oxycodone and gabapentin at home. Patient previously lived in [place] but had since been in [place] where she suffered a right hip fracture more than a month ago.  She was seen in a hospital in [place] where she was treated with some pain medications and Lovenox for 4 flying to the [place].  She was briefly admitted to a hospital in [place] several days ago where she was seen by orthopedic surgery and discharged home with plans for outpatient follow-up.  She has since come to [place], continues to use oxycodone and gabapentin, but has been experiencing increasing pain in the right hip.  She denied any fevers, chills, chest pain, cough, or shortness of breath.  4/25- s/p Right hip girdlestone procedure  Reviewed I/O's: -90 ml x 24 hours and +874 ml since admission  UOP: 450 ml x 24 hours  Attempted to speak with pt via phone, however, no answer.   Per chart review, pt has been confused and sometimes refusing medications and care.   Intake has improved greatly since last visit. Noted meal completion 75-100%. Pt is  refusing Ensure supplements; will d/c due to improved intake.   Per therapy notes, recommending SNF placement once medically stable for discharge.   Labs reviewed: CBGS: 109-117 (inpatient orders for glycemic control are 0-5 units insulin aspart q HS, 0-9 units insulin aspart TID with meals, and 10 units insulin glargine q HS).   Diet Order:   Diet Order            Diet Carb Modified Fluid consistency: Thin; Room service appropriate? No  Diet effective now              EDUCATION NEEDS:   No education needs have been identified at this time  Skin:  Skin Assessment: Skin Integrity Issues: Skin Integrity Issues:: Incisions Incisions: bilateral hips, rt leg Other: bullae present on BLE; partial thickness area to lt buttocks; hyperpignemtation to sacrum  Last BM:  03/12/20  Height:   Ht Readings from Last 1 Encounters:  03/03/20 5\' 2"  (1.575 m)    Weight:   Wt Readings from Last 1 Encounters:  03/03/20 79.7 kg    Ideal Body Weight:  50 kg  BMI:  Body mass index is 32.14 kg/m.  Estimated Nutritional Needs:   Kcal:  1500-1700  Protein:  80-95 grams  Fluid:  > 1.5 L    Loistine Chance, RD, LDN, [place] Registered Dietitian II Certified Diabetes Care and Education Specialist Please refer to [facility] for RD and/or RD on-call/weekend/after hours pager

## 2020-03-14 NOTE — Progress Notes (Signed)
Physical Therapy Treatment Patient Details Name: Whitney [name] MRN: [id number] DOB: 1935/04/23 Today's Date: 03/14/2020    History of Present Illness Whitney [name] is a 85 y.o. female with medical history significant for insulin-dependent diabetes mellitus, chronic kidney disease stage III, and right hip fracture more than a month ago, presented to the hospital with increasing right hip pain. Patient previously lived in [place] but had since been in [place] where she suffered a right hip fracture more than a month ago.  She was seen in a hospital in [place] where she was treated with some pain medications and Lovenox before flying to the [place].  She was briefly admitted to a hospital in [place] several days ago where she was seen by orthopedic surgery and discharged home with plans for outpatient follow-up.  She has since come to [place], continues to use oxycodone and gabapentin, but has been experiencing increasing pain in the right hip.  Orthopedic surgery was consulted and patient underwent right hip Girdlestone procedure on 03/04/20, during the surgery the patient sustained a spiral fracture of her right distal tibia.    PT Comments    Pt supine in bed on arrival.  She responded better to video interpreter Dorris Carnes [phone] ).  Pt daughter over talks interpreter and at times makes communication difficult.  I can tell she means well but this can be distracting.  Pt continues to benefit from snf level therapies based on +2 max to total assistance to move to edge of bed and back to bed.  Continue to progress patient to tolerance.  She made slow functional gains today.      Follow Up Recommendations  SNF     Equipment Recommendations  Wheelchair (measurements PT);Wheelchair cushion (measurements PT);Hospital bed;Other (comment)    Recommendations for Other Services       Precautions / Restrictions Precautions Precautions: Fall Precaution Comments: monitor posture of R hip on  bed Required Braces or Orthoses: Splint/Cast Splint/Cast: R LE Restrictions Weight Bearing Restrictions: Yes RLE Weight Bearing: Non weight bearing LLE Weight Bearing: Weight bearing as tolerated    Mobility  Bed Mobility Overal bed mobility: Needs Assistance Bed Mobility: Rolling;Sit to Supine;Supine to Sit Rolling: Total assist;+2 for physical assistance(post session rollintg to place bed pad under patient;s bottom.) Sidelying to sit: Max assist;+2 for physical assistance;+2 for safety/equipment Supine to sit: Max assist;+2 for physical assistance Sit to supine: Max assist;+2 for physical assistance Sit to sidelying: +2 for physical assistance;+2 for safety/equipment;Total assist General bed mobility comments: Max +2 to move to edge of bed and back.  Pt required frequent pauses and rest periods due to anxiety and pain.  Pt did eventually achieve sitting edge of bed with brief period unassisted but quickly requested support due to anxiety.  Pt slow and guarded during session.  Transfers Overall transfer level: (NT)                  Ambulation/Gait                 Stairs             Wheelchair Mobility    Modified Rankin (Stroke Patients Only)       Balance Overall balance assessment: Needs assistance Sitting-balance support: Bilateral upper extremity supported;Feet supported Sitting balance-Leahy Scale: Poor Sitting balance - Comments: modA for support in sitting;pt able to sit unsupported but due to increased fear of falling, pt requests therapist to provide support;tolerated sitting EOB about 4min this session  Cognition Arousal/Alertness: Awake/alert Behavior During Therapy: Anxious Overall Cognitive Status: Difficult to assess                                 General Comments: Pt very anxious and limited due to pain.  Difficult to even assess due to daughter over talking and use of  interpreter to achieve functional mobility.      Exercises      General Comments General comments (skin integrity, edema, etc.): pt's daughter present during session;utlized interpreter Samir [phone]      Pertinent Vitals/Pain Pain Assessment: Faces Faces Pain Scale: Hurts worst Pain Location: LLE with movement Pain Descriptors / Indicators: Operative site guarding;Discomfort;Crying;Grimacing;Guarding Pain Intervention(s): Monitored during session;Repositioned    Home Living                      Prior Function            PT Goals (current goals can now be found in the care plan section) Acute Rehab PT Goals Patient Stated Goal: pt did not state Potential to Achieve Goals: Fair Progress towards PT goals: Progressing toward goals(very slowly)    Frequency    Min 3X/week      PT Plan Current plan remains appropriate    Co-evaluation PT/OT/SLP Co-Evaluation/Treatment: Yes Reason for Co-Treatment: Complexity of the patient's impairments (multi-system involvement) PT goals addressed during session: Mobility/safety with mobility OT goals addressed during session: ADL's and self-care      AM-PAC PT "6 Clicks" Mobility   Outcome Measure  Help needed turning from your back to your side while in a flat bed without using bedrails?: Total Help needed moving from lying on your back to sitting on the side of a flat bed without using bedrails?: Total Help needed moving to and from a bed to a chair (including a wheelchair)?: Total Help needed standing up from a chair using your arms (e.g., wheelchair or bedside chair)?: Total Help needed to walk in hospital room?: Total Help needed climbing 3-5 steps with a railing? : Total 6 Click Score: 6    End of Session Equipment Utilized During Treatment: Gait belt Activity Tolerance: Patient limited by pain Patient left: in bed;with call bell/phone within reach;with bed alarm set;with nursing/sitter in room Nurse  Communication: Mobility status(informed nursing about patient being on bed pad and daughter requesting to disimpact patient.) PT Visit Diagnosis: Other abnormalities of gait and mobility (R26.89);Muscle weakness (generalized) (M62.81);Pain Pain - Right/Left: Right Pain - part of body: Hip;Leg     Time: 1450-1516 PT Time Calculation (min) (ACUTE ONLY): 26 min  Charges:  $Therapeutic Activity: 8-22 mins                     Erasmo Leventhal , PTA Acute Rehabilitation Services Pager [phone] Office [place] 03/14/2020, 3:54 PM

## 2020-03-15 LAB — GLUCOSE, CAPILLARY
Glucose-Capillary: 109 mg/dL — ABNORMAL HIGH (ref 70–99)
Glucose-Capillary: 126 mg/dL — ABNORMAL HIGH (ref 70–99)
Glucose-Capillary: 141 mg/dL — ABNORMAL HIGH (ref 70–99)
Glucose-Capillary: 101 mg/dL — ABNORMAL HIGH (ref 70–99)

## 2020-03-15 LAB — BASIC METABOLIC PANEL
Anion gap: 9 (ref 5–15)
BUN: 31 mg/dL — ABNORMAL HIGH (ref 8–23)
CO2: 21 mmol/L — ABNORMAL LOW (ref 22–32)
Calcium: 7.6 mg/dL — ABNORMAL LOW (ref 8.9–10.3)
Chloride: 110 mmol/L (ref 98–111)
Creatinine, Ser: 1.29 mg/dL — ABNORMAL HIGH (ref 0.44–1.00)
GFR calc Af Amer: 45 mL/min — ABNORMAL LOW (ref >60)
GFR calc non Af Amer: 39 mL/min — ABNORMAL LOW (ref >60)
Glucose, Bld: 126 mg/dL — ABNORMAL HIGH (ref 70–99)
Potassium: 5.1 mmol/L (ref 3.5–5.1)
Sodium: 140 mmol/L (ref 135–145)

## 2020-03-15 LAB — CBC
HCT: 33.3 % — ABNORMAL LOW (ref 36.0–46.0)
Hemoglobin: 10.3 g/dL — ABNORMAL LOW (ref 12.0–15.0)
MCH: 31 pg (ref 26.0–34.0)
MCHC: 30.9 g/dL (ref 30.0–36.0)
MCV: 100.3 fL — ABNORMAL HIGH (ref 80.0–100.0)
Platelets: 181 10*3/uL (ref 150–400)
RBC: 3.32 MIL/uL — ABNORMAL LOW (ref 3.87–5.11)
RDW: 17 % — ABNORMAL HIGH (ref 11.5–15.5)
WBC: 8.1 10*3/uL (ref 4.0–10.5)
nRBC: 0 % (ref 0.0–0.2)

## 2020-03-15 NOTE — Plan of Care (Signed)
  Problem: Education: Goal: Verbalization of understanding the information provided (i.e., activity precautions, restrictions, etc) will improve Outcome: Progressing   Problem: Activity: Goal: Ability to ambulate and perform ADLs will improve Outcome: Progressing   Problem: Pain Management: Goal: Pain level will decrease Outcome: Progressing

## 2020-03-15 NOTE — Progress Notes (Signed)
Pt refused dressing changes as witnessed by pt.'s daughter.Current dressing on left shin remained clean,dry & intact.

## 2020-03-15 NOTE — Plan of Care (Signed)

## 2020-03-15 NOTE — Progress Notes (Signed)
PROGRESS NOTE    Whitney [name]  TMH:[id number] DOB: Oct 11, 1935 DOA: 03/02/2020 PCP: Patient, No Pcp Per   Chief Complaint  Patient presents with  . Hip Injury    Brief Narrative: HPI on 03/02/2020 by Dr. Verlin Grills a 85 y.o.femalewith medical history significant forinsulin-dependent diabetes mellitus, chronic kidney disease stage III, and right hip fracture more than a month ago, now presenting to the emergency department with increasing right hip pain despite taking oxycodone and gabapentin at home. Patient is accompanied by her daughter who assists with the history. Patient previously lived in [place] but had since been in [place] where she suffered a right hip fracture more than a month ago. She was seen in a hospital in [place] where she was treated with some pain medications and Lovenox for 4 flying to the [place]. She was briefly admitted to a hospital in [place] several days ago where she was seen by orthopedic surgery and discharged home with plans for outpatient follow-up. She has since come to [place], continues to use oxycodone and gabapentin, but has been experiencing increasing pain in the right hip. She denied any fevers, chills, chest pain, cough, or shortness of breath.   Assessment & Plan:   Principal Problem:   Intractable pain Active Problems:   Diabetes mellitus without complication (HCC)   CKD (chronic kidney disease), stage III   Closed right hip fracture (HCC)   Protein calorie malnutrition (HCC)   Bilateral leg edema   Closed right hip fracture, initial encounter ([place])   #1 right hip fracture a month ago status post right hip Girdlestone procedure on 03/04/2020.  Follow-up with Dr. Rigoberto Noel in 2 weeks.  Seen by physical therapy recommending SNF.  Also recommending continuation of splint and elevation of the right lower extremity and nonweightbearing to the right lower extremity.  #2 acute urinary retention Foley catheter  placed by urology.  Follow-up with urology for outpatient cystoscopy.  Continue Flomax.  #3 acute metabolic encephalopathy-normal ammonia normal B12 and TSH.  CT of the head shows chronic small vessel disease without any acute abnormality.  Gabapentin was stopped.  Continue to follow her closely.  Previous physician discussed with Dr. Lorraine Lax who recommended MRI and possibly doing an EEG.  Her daughter wanted to hold off on that at this time.  #4 hyperkalemia she received calcium gluconate insulin dextrose Lokelma and Kayexalate.  Potassium today  #5 CKD stage IIIb at baseline  #6 type 2 diabetes continue Lantus and  SSI. CBG (last 3)  Recent Labs    03/14/20 2006 03/15/20 0753 03/15/20 1145  GLUCAP 101* 109* 126*    #7 chronic bilateral lower extremity edema Echo with normal ejection fraction patient is bedbound at home prior to admission.  Likely due to third spacing from hypoalbuminemia.  #8 constipation continue bowel regimen.   DVT Prophylaxis  Lovenox  Code Status: Full  Family Communication: None at bedside. Daughter via phone.  Disposition Plan:  Status is: Inpatient  Remains inpatient appropriate because: Patient level of care appropriate due to severity of illness and status post surgery with hypotension, hypokalemia and anemia. Mental status has improved. Will need to do voiding trial  Patient may be a difficult discharge as she does insurance however it will not cover SNF. Patient's daughter lives in a 3rd floor apt which would not be a safe discharge. Discussed with TOC, she has been set up for [facility].   Dispo: The patient is from: Home  Anticipated d/c is  to: Home with [facility]  Anticipated d/c date is: 2 days  Patient currently is not medically stable to d/c.   Consultants Orthopedics Urology  Procedures  Echocardiogram Right hip Girdlestone procedure by orthopedics on 03/05/2020 Right leg splint 03/05/2020 Foley catheter  placement by urology 03/08/2020    Antimicrobials: Anti-infectives (From admission, onward)   Start     Dose/Rate Route Frequency Ordered Stop   03/09/20 1030  ciprofloxacin (CIPRO) tablet 250 mg  Status:  Discontinued     250 mg Oral Daily with breakfast 03/09/20 1020 03/09/20 1200   03/08/20 2000  ciprofloxacin (CIPRO) tablet 250 mg  Status:  Discontinued     250 mg Oral 2 times daily 03/08/20 1753 03/09/20 1020   03/05/20 2100  ceFAZolin (ANCEF) IVPB 2g/100 mL premix     2 g 200 mL/hr over 30 Minutes Intravenous Every 8 hours 03/05/20 2043 03/06/20 0547   03/05/20 0818  vancomycin (VANCOCIN) powder  Status:  Discontinued       As needed 03/05/20 0819 03/05/20 0913   03/05/20 0600  ceFAZolin (ANCEF) IVPB 2g/100 mL premix  Status:  Discontinued     2 g 200 mL/hr over 30 Minutes Intravenous On call to O.R. 03/04/20 1713 03/05/20 1055       Subjective: Resting in bed in nad  Objective: Vitals:   03/14/20 1500 03/14/20 1934 03/15/20 0340 03/15/20 0930  BP: 125/75 (!) 119/53 (!) 105/42 (!) 122/51  Pulse: 89 89 82 90  Resp: 18 17 16 18   Temp: 97.8 F (36.6 C) 98.6 F (37 C) 98.5 F (36.9 C) 98.4 F (36.9 C)  TempSrc: Oral Oral Oral Oral  SpO2: 99% 93% 93% 96%  Weight:      Height:        Intake/Output Summary (Last 24 hours) at 03/15/2020 1214 Last data filed at 03/14/2020 1700 Gross per 24 hour  Intake 120 ml  Output 300 ml  Net -180 ml   Filed Weights   03/03/20 0421 03/03/20 0838  Weight: 79.7 kg 79.7 kg    Examination:  General exam: Appears calm and comfortable  Respiratory system: Clear to auscultation. Respiratory effort normal. Cardiovascular system: S1 & S2 heard, RRR. No JVD, murmurs, rubs, gallops or clicks. No pedal edema. Gastrointestinal system: Abdomen is nondistended, soft and nontender. No organomegaly or masses felt. Normal bowel sounds heard. Central nervous system: Alert and oriented. No focal neurological deficits. Extremities:2 plus  edema Skin: No rashes, lesions or ulcers Psychiatry: Judgement and insight appear normal. Mood & affect appropriate.     Data Reviewed: I have personally reviewed following labs and imaging studies  CBC: Recent Labs  Lab 03/09/20 2121 03/09/20 2121 03/10/20 0247 03/10/20 0247 03/11/20 0331 03/12/20 0620 03/13/20 0300 03/14/20 0640 03/15/20 0553  WBC 7.3  --  6.1  --  5.3 5.4  --   --  8.1  HGB 10.2*   < > 9.1*   < > 10.0* 10.0* 9.7* 9.7* 10.3*  HCT 32.5*   < > 29.6*   < > 32.1* 32.6* 31.7* 30.8* 33.3*  MCV 97.6  --  99.3  --  99.1 98.5  --   --  100.3*  PLT 142*  --  127*  --  140* 130*  --   --  181   < > = values in this interval not displayed.    Basic Metabolic Panel: Recent Labs  Lab 03/09/20 1125 03/09/20 2121 03/11/20 0331 03/12/20 5726 03/13/20 0300 03/14/20 2035 03/15/20 5974  NA 141   < > 143 145 144 140 140  K 5.5*   < > 4.6 4.8 4.7 4.5 5.1  CL 113*   < > 118* 117* 116* 114* 110  CO2 21*   < > 21* 23 24 23  21*  GLUCOSE 189*   < > 178* 127* 145* 61* 126*  BUN 44*   < > 42* 40* 36* 33* 31*  CREATININE 1.88*   < > 1.43* 1.35* 1.36* 1.19* 1.29*  CALCIUM 7.6*   < > 7.6* 7.7* 7.5* 7.6* 7.6*  MG 2.4  --   --   --   --   --   --    < > = values in this interval not displayed.    GFR: Estimated Creatinine Clearance: 32.9 mL/min (A) (by C-G formula based on SCr of 1.29 mg/dL (H)).  Liver Function Tests: Recent Labs  Lab 03/09/20 1125 03/10/20 0247  AST 21 17  ALT <5 6  ALKPHOS 206* 182*  BILITOT 1.1 0.9  PROT 5.0* 4.4*  ALBUMIN 1.6* 1.4*    CBG: Recent Labs  Lab 03/14/20 1145 03/14/20 1654 03/14/20 2006 03/15/20 0753 03/15/20 1145  GLUCAP 147* 93 101* 109* 126*     Recent Results (from the past 240 hour(s))  Culture, Urine     Status: None   Collection Time: 03/08/20  5:50 PM   Specimen: Urine, Clean Catch  Result Value Ref Range Status   Specimen Description URINE, CLEAN CATCH  Final   Special Requests NONE  Final   Culture    Final    NO GROWTH Performed at [facility], [place] [address] [place], [place] [address]    Report Status 03/09/2020 FINAL  Final         Radiology Studies: No results found.      Scheduled Meds: . bisacodyl  10 mg Rectal Daily  . Chlorhexidine Gluconate Cloth  6 each Topical Daily  . docusate sodium  100 mg Oral BID  . insulin aspart  0-5 Units Subcutaneous QHS  . insulin aspart  0-9 Units Subcutaneous TID WC  . insulin glargine  5 Units Subcutaneous QHS  . LORazepam  1 mg Intravenous Once  . multivitamin with minerals  1 tablet Oral Daily  . polyethylene glycol  17 g Oral Daily  . sodium chloride flush  3 mL Intravenous Q12H  . tamsulosin  0.4 mg Oral Daily   Continuous Infusions: . sodium chloride    . methocarbamol (ROBAXIN) IV       LOS: 12 days     Georgette Shell, MD [facility]   To contact the attending provider between 7A-7P or the covering provider during after hours 7P-7A, please log into the web site [email or web address] and access using universal [place] password for that web site. If you do not have the password, please call the hospital operator.  03/15/2020, 12:14 PM

## 2020-03-16 LAB — GLUCOSE, CAPILLARY
Glucose-Capillary: 99 mg/dL (ref 70–99)
Glucose-Capillary: 130 mg/dL — ABNORMAL HIGH (ref 70–99)
Glucose-Capillary: 157 mg/dL — ABNORMAL HIGH (ref 70–99)
Glucose-Capillary: 169 mg/dL — ABNORMAL HIGH (ref 70–99)

## 2020-03-16 NOTE — Progress Notes (Signed)
PROGRESS NOTE    Whitney [name]  KGU:[id number] DOB: July 30, 1935 DOA: 03/02/2020 PCP: Patient, No Pcp Per   Chief Complaint  Patient presents with  . Hip Injury    Brief Narrative: HPI on 03/02/2020 by Dr. Verlin Grills a 85 y.o.femalewith medical history significant forinsulin-dependent diabetes mellitus, chronic kidney disease stage III, and right hip fracture more than a month ago, now presenting to the emergency department with increasing right hip pain despite taking oxycodone and gabapentin at home. Patient is accompanied by her daughter who assists with the history. Patient previously lived in [place] but had since been in [place] where she suffered a right hip fracture more than a month ago. She was seen in a hospital in [place] where she was treated with some pain medications and Lovenox for 4 flying to the [place]. She was briefly admitted to a hospital in [place] several days ago where she was seen by orthopedic surgery and discharged home with plans for outpatient follow-up. She has since come to [place], continues to use oxycodone and gabapentin, but has been experiencing increasing pain in the right hip. She denied any fevers, chills, chest pain, cough, or shortness of breath.  5/6 -resting in bed in nad . Assessment & Plan:   Principal Problem:   Intractable pain Active Problems:   Diabetes mellitus without complication (HCC)   CKD (chronic kidney disease), stage III   Closed right hip fracture (HCC)   Protein calorie malnutrition (HCC)   Bilateral leg edema   Closed right hip fracture, initial encounter ([place])    #1 right hip fracture a month ago status post right hip Girdlestone procedure on 03/04/2020.  Follow-up with Dr. Rigoberto Noel in 2 weeks.  Seen by physical therapy recommending SNF.  Also recommending continuation of splint and elevation of the right lower extremity and nonweightbearing to the right lower extremity.  #2 acute  urinary retention Foley catheter placed by urology.  Follow-up with urology for outpatient cystoscopy.  Continue Flomax.  #3 acute metabolic encephalopathy-normal ammonia normal B12 and TSH.  CT of the head shows chronic small vessel disease without any acute abnormality.  Gabapentin was stopped.  Continue to follow her closely.  Previous physician discussed with Dr. Lorraine Lax who recommended MRI and possibly doing an EEG.  Her daughter wanted to hold off on that at this time. Her mental status is improved.  She is able to speak little bit of English.  #4 hyperkalemia she received calcium gluconate insulin dextrose Lokelma and Kayexalate.  Potassium 5.1  #5 CKD stage IIIb at baseline creatinine 1.29  #6 type 2 diabetes continue Lantus and  SSI CBG (last 3)  Recent Labs    03/15/20 1629 03/15/20 2020 03/16/20 0724  GLUCAP 141* 101* 99    #7 chronic bilateral lower extremity edema Echo with normal ejection fraction patient is bedbound at home prior to admission.  Likely due to third spacing from hypoalbuminemia. Albumin 1.4  Consult dietary  #8 constipation continue bowel regimen.   DVT ProphylaxisLovenox  Code Status:Full  Family Communication:Called daughter on her cell phone did not answer Disposition Plan: Status is: Inpatient, patient is stable for discharge however she has unsafe discharge plan.  She lives on the third floor of the apartment and does not have an elevator.  She will not be able to walk up the stairs to get to her apartment neither would she be able to leave the apartment in case of an emergency. PT has seen her and  recommended SNF.  She does not have any bed offers. Discussed with case Freight forwarder. Dispo: The patient is from: Home Anticipated d/c is to:snf Anticipated d/c date is: unknown Patient currentlyis  medically stable to d/c  Consultants Orthopedics Urology  Procedures Echocardiogram Right hip  Girdlestone procedure by orthopedics on 03/05/2020 Right leg splint 03/05/2020 Foley catheter placement by urology 03/08/2020  Antimicrobials - Anti-infectives (From admission, onward)   Start     Dose/Rate Route Frequency Ordered Stop   03/09/20 1030  ciprofloxacin (CIPRO) tablet 250 mg  Status:  Discontinued     250 mg Oral Daily with breakfast 03/09/20 1020 03/09/20 1200   03/08/20 2000  ciprofloxacin (CIPRO) tablet 250 mg  Status:  Discontinued     250 mg Oral 2 times daily 03/08/20 1753 03/09/20 1020   03/05/20 2100  ceFAZolin (ANCEF) IVPB 2g/100 mL premix     2 g 200 mL/hr over 30 Minutes Intravenous Every 8 hours 03/05/20 2043 03/06/20 0547   03/05/20 0818  vancomycin (VANCOCIN) powder  Status:  Discontinued       As needed 03/05/20 0819 03/05/20 0913   03/05/20 0600  ceFAZolin (ANCEF) IVPB 2g/100 mL premix  Status:  Discontinued     2 g 200 mL/hr over 30 Minutes Intravenous On call to O.R. 03/04/20 1713 03/05/20 1055       Subjective:  Patient resting in bed in nad smiling  Objective: Vitals:   03/15/20 1341 03/15/20 1925 03/16/20 0536 03/16/20 0728  BP: (!) 132/51 (!) 103/55 137/77 120/68  Pulse: 91 66 86 79  Resp: 18 16 18 17   Temp: 98.3 F (36.8 C) 99.4 F (37.4 C) 98.4 F (36.9 C) 97.9 F (36.6 C)  TempSrc: Oral Oral Oral Oral  SpO2: 94% 91% 95% 96%  Weight:      Height:        Intake/Output Summary (Last 24 hours) at 03/16/2020 1200 Last data filed at 03/16/2020 0536 Gross per 24 hour  Intake --  Output 650 ml  Net -650 ml   Filed Weights   03/03/20 0421 03/03/20 0838  Weight: 79.7 kg 79.7 kg    Examination:  General exam: Appears calm and comfortable  Respiratory system: Clear to auscultation. Respiratory effort normal. Cardiovascular system: S1 & S2 heard, RRR. No JVD, murmurs, rubs, gallops or clicks. No pedal edema. Gastrointestinal system: Abdomen is nondistended, soft and nontender. No organomegaly or masses felt. Normal bowel sounds  heard. Central nervous system: Alert and oriented. No focal neurological deficits. Extremities 2 plus edema Skin: No rashes, lesions or ulcers Psychiatry: Judgement and insight appear normal. Mood & affect appropriate.     Data Reviewed: I have personally reviewed following labs and imaging studies  CBC: Recent Labs  Lab 03/09/20 2121 03/09/20 2121 03/10/20 0247 03/10/20 0247 03/11/20 0331 03/12/20 0620 03/13/20 0300 03/14/20 0640 03/15/20 0553  WBC 7.3  --  6.1  --  5.3 5.4  --   --  8.1  HGB 10.2*   < > 9.1*   < > 10.0* 10.0* 9.7* 9.7* 10.3*  HCT 32.5*   < > 29.6*   < > 32.1* 32.6* 31.7* 30.8* 33.3*  MCV 97.6  --  99.3  --  99.1 98.5  --   --  100.3*  PLT 142*  --  127*  --  140* 130*  --   --  181   < > = values in this interval not displayed.    Basic Metabolic Panel: Recent Labs  Lab  03/11/20 0331 03/12/20 0620 03/13/20 0300 03/14/20 0640 03/15/20 0553  NA 143 145 144 140 140  K 4.6 4.8 4.7 4.5 5.1  CL 118* 117* 116* 114* 110  CO2 21* 23 24 23  21*  GLUCOSE 178* 127* 145* 61* 126*  BUN 42* 40* 36* 33* 31*  CREATININE 1.43* 1.35* 1.36* 1.19* 1.29*  CALCIUM 7.6* 7.7* 7.5* 7.6* 7.6*    GFR: Estimated Creatinine Clearance: 32.9 mL/min (A) (by C-G formula based on SCr of 1.29 mg/dL (H)).  Liver Function Tests: Recent Labs  Lab 03/10/20 0247  AST 17  ALT 6  ALKPHOS 182*  BILITOT 0.9  PROT 4.4*  ALBUMIN 1.4*    CBG: Recent Labs  Lab 03/15/20 0753 03/15/20 1145 03/15/20 1629 03/15/20 2020 03/16/20 0724  GLUCAP 109* 126* 141* 101* 99     Recent Results (from the past 240 hour(s))  Culture, Urine     Status: None   Collection Time: 03/08/20  5:50 PM   Specimen: Urine, Clean Catch  Result Value Ref Range Status   Specimen Description URINE, CLEAN CATCH  Final   Special Requests NONE  Final   Culture   Final    NO GROWTH Performed at [facility], [place] [address], [place], [place] [address]    Report Status 03/09/2020 FINAL  Final          Radiology Studies: No results found.      Scheduled Meds: . bisacodyl  10 mg Rectal Daily  . Chlorhexidine Gluconate Cloth  6 each Topical Daily  . docusate sodium  100 mg Oral BID  . insulin aspart  0-5 Units Subcutaneous QHS  . insulin aspart  0-9 Units Subcutaneous TID WC  . insulin glargine  5 Units Subcutaneous QHS  . LORazepam  1 mg Intravenous Once  . multivitamin with minerals  1 tablet Oral Daily  . polyethylene glycol  17 g Oral Daily  . sodium chloride flush  3 mL Intravenous Q12H  . tamsulosin  0.4 mg Oral Daily   Continuous Infusions: . sodium chloride    . methocarbamol (ROBAXIN) IV       LOS: 13 days     Georgette Shell, MD [facility]   To contact the attending provider between 7A-7P or the covering provider during after hours 7P-7A, please log into the web site [email or web address] and access using universal [place] password for that web site. If you do not have the password, please call the hospital operator.  03/16/2020, 12:00 PM

## 2020-03-16 NOTE — Progress Notes (Signed)
Nutrition Follow-up  DOCUMENTATION CODES:   Obesity unspecified  INTERVENTION:   -Continue MVI with minerals daily -30 ml Prostat BID, each supplement provides 100 kcals and 15 grams protein  NUTRITION DIAGNOSIS:   Increased nutrient needs related to wound healing as evidenced by estimated needs.  Ongoing  GOAL:   Patient will meet greater than or equal to 90% of their needs  Progressing   MONITOR:   PO intake, Supplement acceptance, Labs, Weight trends, Skin, I & O's  REASON FOR ASSESSMENT:   Consult Assessment of nutrition requirement/status  ASSESSMENT:   Whitney [name] is a 85 y.o. female with medical history significant for insulin-dependent diabetes mellitus, chronic kidney disease stage III, and right hip fracture more than a month ago, now presenting to the emergency department with increasing right hip pain despite taking oxycodone and gabapentin at home. Patient previously lived in [place] but had since been in [place] where she suffered a right hip fracture more than a month ago.  She was seen in a hospital in [place] where she was treated with some pain medications and Lovenox for 4 flying to the [place].  She was briefly admitted to a hospital in [place] several days ago where she was seen by orthopedic surgery and discharged home with plans for outpatient follow-up.  She has since come to [place], continues to use oxycodone and gabapentin, but has been experiencing increasing pain in the right hip.  She denied any fevers, chills, chest pain, cough, or shortness of breath.  Reviewed I/O's: -650 ml x 24 hours and +107 ml since admission  UOP: 650 ml x 24 hours  Attempted to speak with pt via phone, however, "user busy". Also attempted to speak with pt again, however, not available.   Per chart review, confusion has improved.   Intake has been variable; noted meal completion 10-100% (averaging around 25-60%). Pt has refused Ensure supplements in  the past. She is requesting no pork or caffeine products.   Per [facility] team notes, pt is a difficult placement for SNF. Daughter plans to move to a first floor level apartment to accommodate pt's needs.   Albumin has a half-life of 21 days and is strongly affected by stress response and inflammatory process, therefore, do not expect to see an improvement in this lab value during acute hospitalization.When a patient presents with low albumin, it is likely skewed due to the acute inflammatory response.  Unless it is suspected that patient had poor PO intake or malnutrition prior to admission, then RD should not be consulted solely for low albumin. Note that low albumin is no longer used to diagnose malnutrition; [place] uses the new malnutrition guidelines published by the American Society for Parenteral and Enteral Nutrition (A.S.P.E.N.) and the Academy of Nutrition and Dietetics (AND).    Labs reviewed: CBGS: 99-141 (inpatient orders for glycemic control are 0-5 units insulin aspart q HS, 0-9 units insulin aspart TID with meals, and 5 units insulin glargine q HS).   Diet Order:   Diet Order            Diet Carb Modified Fluid consistency: Thin; Room service appropriate? No  Diet effective now              EDUCATION NEEDS:   No education needs have been identified at this time  Skin:  Skin Assessment: Skin Integrity Issues: Skin Integrity Issues:: Incisions Incisions: bilateral hips, rt leg Other: bullae present on BLE; partial thickness area to lt buttocks;  hyperpignemtation to sacrum  Last BM:  03/14/20  Height:   Ht Readings from Last 1 Encounters:  03/03/20 5\' 2"  (1.575 m)    Weight:   Wt Readings from Last 1 Encounters:  03/03/20 79.7 kg    Ideal Body Weight:  50 kg  BMI:  Body mass index is 32.14 kg/m.  Estimated Nutritional Needs:   Kcal:  1500-1700  Protein:  80-95 grams  Fluid:  > 1.5 L   Loistine Chance, RD, LDN, [place] Registered Dietitian II Certified  Diabetes Care and Education Specialist Please refer to [facility] for RD and/or RD on-call/weekend/after hours pager

## 2020-03-16 NOTE — TOC Progression Note (Signed)
Transition of Care [facility]) - Progression Note    Patient Details  Name: Whitney [name] MRN: [id number] Date of Birth: 1935/10/14  Transition of Care [facility]) CM/SW Contact  Curlene Labrum, RN Phone Number: 03/16/2020, 1:06 PM  Clinical Narrative:    Case management spoke with the daughter at the bedside regarding difficulty in placing the patient in a SNF facility due to [employer], [employer], and language barrier.  The daughter would not be able to visit the patient in the SNF units as well.  The patient speaks Arabic and many of the facilities are out of network for this insurance provider.  The patient will not be eligible for Medicare until 03/31/20 according to the daughter.  The patient's daughter currently resides on a 3rd story apartment with no elevator access.  She lives at [employer] at [employer] at [place], [place], [place].  The administrator at the apartment is Janey Genta - [phone].    The daughter is certainly open to moving to the first floor with possible assistance with an LOG from the hospital, considering lack to SNF choices and inability to visit the patient due to COVID safety rules at the facilities.  Barbette Or, MSW will follow to help expedite a quicker move if possible to a downstairs apartment.   DME that includes hospital bed, hoyer lift, trapeze, WC and 3:1 would be needed for discharge equipment if feasible.     Expected Discharge Plan: [place] Barriers to Discharge: Family Issues  Expected Discharge Plan and Services Expected Discharge Plan: [place]   Discharge Planning Services: CM Consult Post Acute Care Choice: Durable Medical Equipment, Home Health Living arrangements for the past 2 months: Apartment(Patient was flown from [place] to [place] to have surgery.)                 DME Arranged: 3-N-1, Hospital bed, Other see comment, Trapeze(Hoyer lift) DME Agency: [employer] Date DME Agency  Contacted: 03/06/20 Time DME Agency Contacted: [phone] Representative spoke with at DME Agency: [place]: PT, OT [place] Agency: [facility] [employer] Date [place]: 03/06/20 Time [place]: 1548 Representative spoke with at [place]: [place] ([place]) Interventions    Readmission Risk Interventions No flowsheet data found.

## 2020-03-17 LAB — GLUCOSE, CAPILLARY
Glucose-Capillary: 171 mg/dL — ABNORMAL HIGH (ref 70–99)
Glucose-Capillary: 167 mg/dL — ABNORMAL HIGH (ref 70–99)
Glucose-Capillary: 180 mg/dL — ABNORMAL HIGH (ref 70–99)
Glucose-Capillary: 134 mg/dL — ABNORMAL HIGH (ref 70–99)

## 2020-03-17 NOTE — Progress Notes (Signed)
PROGRESS NOTE    Whitney [name]  HEN:[id number] DOB: 11-27-1934 DOA: 03/02/2020 PCP: Patient, No Pcp Per   Chief Complaint  Patient presents with  . Hip Injury    Brief Narrative: HPI on 03/02/2020 by Dr. Verlin Grills a 85 y.o.femalewith medical history significant forinsulin-dependent diabetes mellitus, chronic kidney disease stage III, and right hip fracture more than a month ago, now presenting to the emergency department with increasing right hip pain despite taking oxycodone and gabapentin at home. Patient is accompanied by her daughter who assists with the history. Patient previously lived in [place] but had since been in [place] where she suffered a right hip fracture more than a month ago. She was seen in a hospital in [place] where she was treated with some pain medications and Lovenox for 4 flying to the [place]. She was briefly admitted to a hospital in [place] several days ago where she was seen by orthopedic surgery and discharged home with plans for outpatient follow-up. She has since come to [place], continues to use oxycodone and gabapentin, but has been experiencing increasing pain in the right hip. She denied any fevers, chills, chest pain, cough, or shortness of breath.  5/6 -resting in bed in [place] .  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 Follow-up with Dr. Rigoberto Noel in 2 weeks. Seen by physical therapy recommending SNF. Also recommending continuation of splint and elevation of the right lower extremity and nonweightbearing to the right lower  extremity.  #2 acute urinary retention Foley catheter placed by urology. Follow-up with urology for outpatient cystoscopy. Continue Flomax.  #3 acute metabolic encephalopathy-normal ammonia normal B12 and TSH. CT of the head shows chronic small vessel disease without any acute abnormality. Gabapentin was stopped. Continue to follow her closely. Previous physician discussed with Dr. Lorraine Lax who recommended MRI and possibly doing an EEG. Her daughter wanted to hold off on that at this time. Her mental status is improved.  She is able to speak little bit of English.  #4 hyperkalemia she received calcium gluconate insulin dextrose Lokelma and Kayexalate. Potassium 5.1  #5 CKD stage IIIb at baseline creatinine 1.29  #6 type 2 diabetes continue Lantus and SSI CBG (last 3)  Recent Labs    03/16/20 2210 03/17/20 0750 03/17/20 1144  GLUCAP 169* 171* 167*   #7 chronic bilateral lower extremity edema Echo with normal ejection fraction patient is bedbound at home prior to admission. Likely due to third spacing from hypoalbuminemia. Albumin 1.4  Consult dietary  #8 constipation continue bowel regimen  DVT ProphylaxisLovenox  Code Status:Full  Family Communication:dw daughter 5/6 Disposition Plan: Status is: Inpatient, patient is stable for discharge however she has unsafe discharge plan.  She lives on the third floor of the apartment and does not have an elevator.  She will not be able to walk up the stairs to get to her apartment neither would she be able to leave the apartment in case of an emergency. PT has seen her and recommended SNF.  She does not have any bed offers. Discussed with case Freight forwarder. Dispo:  The patient is from: Home Anticipated d/c is to:snf Anticipated d/c date is: unknown Patient currentlyis  medically stable to d/c  Consultants Orthopedics Urology  Procedures Echocardiogram Right hip Girdlestone  procedure by orthopedics on 03/05/2020 Right leg splint 03/05/2020 Foley catheter placement by urology 03/08/2020   Antimicrobials:  Anti-infectives (From admission, onward)   Start     Dose/Rate Route Frequency Ordered Stop   03/09/20 1030  ciprofloxacin (CIPRO) tablet 250 mg  Status:  Discontinued     250 mg Oral Daily with breakfast 03/09/20 1020 03/09/20 1200   03/08/20 2000  ciprofloxacin (CIPRO) tablet 250 mg  Status:  Discontinued     250 mg Oral 2 times daily 03/08/20 1753 03/09/20 1020   03/05/20 2100  ceFAZolin (ANCEF) IVPB 2g/100 mL premix     2 g 200 mL/hr over 30 Minutes Intravenous Every 8 hours 03/05/20 2043 03/06/20 0547   03/05/20 0818  vancomycin (VANCOCIN) powder  Status:  Discontinued       As needed 03/05/20 0819 03/05/20 0913   03/05/20 0600  ceFAZolin (ANCEF) IVPB 2g/100 mL premix  Status:  Discontinued     2 g 200 mL/hr over 30 Minutes Intravenous On call to O.R. 03/04/20 1713 03/05/20 1055      Subjective:   resting in bed  Objective: Vitals:   03/16/20 0728 03/16/20 2006 03/17/20 0411 03/17/20 0927  BP: 120/68 (!) 131/50 130/65 (!) 115/57  Pulse: 79 90 80 71  Resp: 17 18 17 17   Temp: 97.9 F (36.6 C) 98.4 F (36.9 C) 98.7 F (37.1 C) (!) 97.5 F (36.4 C)  TempSrc: Oral Oral Oral Oral  SpO2: 96% 99% 96% 92%  Weight:      Height:       No intake or output data in the 24 hours ending 03/17/20 1416 Filed Weights   03/03/20 0421 03/03/20 0838  Weight: 79.7 kg 79.7 kg    Examination:  General exam: Appears calm and comfortable  Respiratory system: Clear to auscultation. Respiratory effort normal. Cardiovascular system: S1 & S2 heard, RRR. No JVD, murmurs, rubs, gallops or clicks. No pedal edema. Gastrointestinal system: Abdomen is nondistended, soft and nontender. No organomegaly or masses felt. Normal bowel sounds heard. Central nervous system: Alert and oriented. No focal neurological deficits. Extremities: 2 plus edema Skin: No rashes,  lesions or ulcers Psychiatry: Judgement and insight appear normal. Mood & affect appropriate.     Data Reviewed: I have personally reviewed following labs and imaging studies  CBC: Recent Labs  Lab 03/11/20 0331 03/12/20 0620 03/13/20 0300 03/14/20 0640 03/15/20 0553  WBC 5.3 5.4  --   --  8.1  HGB 10.0* 10.0* 9.7* 9.7* 10.3*  HCT 32.1* 32.6* 31.7* 30.8* 33.3*  MCV 99.1 98.5  --   --  100.3*  PLT 140* 130*  --   --  010    Basic Metabolic Panel: Recent Labs  Lab 03/11/20 0331 03/12/20 0620 03/13/20 0300 03/14/20 0640 03/15/20 0553  NA 143 145 144 140 140  K 4.6 4.8 4.7 4.5 5.1  CL 118* 117* 116* 114* 110  CO2 21* 23 24 23  21*  GLUCOSE 178* 127* 145* 61* 126*  BUN 42* 40* 36* 33* 31*  CREATININE 1.43* 1.35* 1.36* 1.19* 1.29*  CALCIUM 7.6* 7.7* 7.5* 7.6* 7.6*    GFR: Estimated Creatinine Clearance: 32.9 mL/min (A) (by C-G formula based on SCr of 1.29 mg/dL (H)).  Liver Function Tests: No results for input(s): AST, ALT, ALKPHOS, BILITOT, PROT, ALBUMIN  in the last 168 hours.  CBG: Recent Labs  Lab 03/16/20 1211 03/16/20 1732 03/16/20 2210 03/17/20 0750 03/17/20 1144  GLUCAP 130* 157* 169* 171* 167*     Recent Results (from the past 240 hour(s))  Culture, Urine     Status: None   Collection Time: 03/08/20  5:50 PM   Specimen: Urine, Clean Catch  Result Value Ref Range Status   Specimen Description URINE, CLEAN CATCH  Final   Special Requests NONE  Final   Culture   Final    NO GROWTH Performed at [facility], [place] [address], [place], [place] [address]    Report Status 03/09/2020 FINAL  Final         Radiology Studies: No results found.      Scheduled Meds: . bisacodyl  10 mg Rectal Daily  . Chlorhexidine Gluconate Cloth  6 each Topical Daily  . docusate sodium  100 mg Oral BID  . insulin aspart  0-5 Units Subcutaneous QHS  . insulin aspart  0-9 Units Subcutaneous TID WC  . insulin glargine  5 Units Subcutaneous QHS  .  LORazepam  1 mg Intravenous Once  . multivitamin with minerals  1 tablet Oral Daily  . polyethylene glycol  17 g Oral Daily  . sodium chloride flush  3 mL Intravenous Q12H  . tamsulosin  0.4 mg Oral Daily   Continuous Infusions: . sodium chloride    . methocarbamol (ROBAXIN) IV       LOS: 14 days     Georgette Shell, MD [facility]   To contact the attending provider between 7A-7P or the covering provider during after hours 7P-7A, please log into the web site [email or web address] and access using universal [place] password for that web site. If you do not have the password, please call the hospital operator.  03/17/2020, 2:16 PM

## 2020-03-17 NOTE — Plan of Care (Signed)

## 2020-03-17 NOTE — Progress Notes (Addendum)
Physical Therapy Treatment Patient Details Name: Whitney [name] MRN: [id number] DOB: 07/25/1935 Today's Date: 03/17/2020    History of Present Illness Pt is an 85 y.o. female who sustained R hip fx >1 month prior in [place], was seen in [place] hospital and treated with pain meds and Lovenox before flying to [place], was briefly admitted to [place] seen by orthopedic sx, pt has since come to [facility] using oxycodone and gabapentin, but had increasing R hip pain, ultimately admitted to [facility] 03/02/20 and s/p R hip Girdlestone procedure on 4/24; during sx, pt sustained a spiral fx of R distal tibia. PMH includes DM, CKD III.   PT Comments    Pt seen with bowel incontinence requiring linen change. Pt participating well with mobility this session, able to use BUEs to roll with modA+2; dependent for pericare and washup, daughter present and supportive. Pt agreeable to sit EOB, requiring maxA+2. Remains limited by BLE pain with all movement. Allowed PT to reposition BLEs to encourage neutral rotation. Pt stating, "Thank you ladies" in English at end of session.    Follow Up Recommendations  SNF;Supervision for mobility/OOB     Equipment Recommendations  Wheelchair (measurements PT);Wheelchair cushion (measurements PT);Hospital bed;Other (comment)(hoyer lift)    Recommendations for Other Services       Precautions / Restrictions Precautions Precautions: Fall;Other (comment) Precaution Comments: Bowel incontinence Required Braces or Orthoses: Splint/Cast Splint/Cast: R lower leg Restrictions Weight Bearing Restrictions: Yes RLE Weight Bearing: Non weight bearing    Mobility  Bed Mobility Overal bed mobility: Needs Assistance Bed Mobility: Rolling;Sit to Supine;Supine to Sit Rolling: Mod assist;+2 for physical assistance   Supine to sit: Max assist;+2 for physical assistance Sit to supine: Total assist   General bed mobility comments: ModA+2 to roll for repositioning and  pericare/linen change due to bowel incontinence; assist to roll trunk and for LE positioning, pt assisting well with BUE support on bed rail; less assist required rolling L as compared to R. Agreeable to sit EOB without hesitation; maxA+2 to elevate trunk and manage BLEs, pt with L hip starting to slide from EOB requiring totalA for return to supine  Transfers                 General transfer comment: Hopeful to try maximove lift next session  Ambulation/Gait                 Stairs             Wheelchair Mobility    Modified Rankin (Stroke Patients Only)       Balance Overall balance assessment: Needs assistance Sitting-balance support: Bilateral upper extremity supported;Feet supported Sitting balance-Leahy Scale: Poor Sitting balance - Comments: Assist to maintain trunk control and prevent L hip from sliding off EOB Postural control: Posterior lean;Left lateral lean                                  Cognition Arousal/Alertness: Awake/alert Behavior During Therapy: Anxious Overall Cognitive Status: Difficult to assess                                 General Comments: Pt seems anxious regarding pain, but ultimately following majority of one-step gestural commands appropriately. Said "thank you ladies" in [place] at end of session      Exercises      General Comments General  comments (skin integrity, edema, etc.): Daughter present throughout session      Pertinent Vitals/Pain Pain Assessment: Faces Faces Pain Scale: Hurts whole lot Pain Location: BLE with movement/repositioning Pain Descriptors / Indicators: Operative site guarding;Discomfort;Grimacing;Guarding Pain Intervention(s): Monitored during session;Repositioned    Home Living                      Prior Function            PT Goals (current goals can now be found in the care plan section) Progress towards PT goals: Progressing toward goals     Frequency    Min 3X/week      PT Plan Current plan remains appropriate    Co-evaluation              AM-PAC PT "6 Clicks" Mobility   Outcome Measure  Help needed turning from your back to your side while in a flat bed without using bedrails?: Total Help needed moving from lying on your back to sitting on the side of a flat bed without using bedrails?: Total Help needed moving to and from a bed to a chair (including a wheelchair)?: Total Help needed standing up from a chair using your arms (e.g., wheelchair or bedside chair)?: Total Help needed to walk in hospital room?: Total Help needed climbing 3-5 steps with a railing? : Total 6 Click Score: 6    End of Session   Activity Tolerance: Patient tolerated treatment well;Patient limited by pain Patient left: in bed;with call bell/phone within reach;with family/visitor present Nurse Communication: Mobility status(bowel incontinence, linen change and sacral pad) PT Visit Diagnosis: Other abnormalities of gait and mobility (R26.89);Muscle weakness (generalized) (M62.81);Pain     Time: 8177-1165 PT Time Calculation (min) (ACUTE ONLY): 35 min  Charges:  $Therapeutic Activity: 23-37 mins                    Mabeline Caras, PT, DPT Acute Rehabilitation Services  Pager [phone] Office [place] 03/17/2020, 3:49 PM

## 2020-03-18 ENCOUNTER — Inpatient Hospital Stay (HOSPITAL_COMMUNITY): Payer: 59

## 2020-03-18 LAB — CBC
HCT: 33 % — ABNORMAL LOW (ref 36.0–46.0)
Hemoglobin: 10 g/dL — ABNORMAL LOW (ref 12.0–15.0)
MCH: 30.3 pg (ref 26.0–34.0)
MCHC: 30.3 g/dL (ref 30.0–36.0)
MCV: 100 fL (ref 80.0–100.0)
Platelets: 241 10*3/uL (ref 150–400)
RBC: 3.3 MIL/uL — ABNORMAL LOW (ref 3.87–5.11)
RDW: 17.2 % — ABNORMAL HIGH (ref 11.5–15.5)
WBC: 8.7 10*3/uL (ref 4.0–10.5)
nRBC: 0 % (ref 0.0–0.2)

## 2020-03-18 LAB — COMPREHENSIVE METABOLIC PANEL
ALT: 13 U/L (ref 0–44)
AST: 29 U/L (ref 15–41)
Albumin: 1.6 g/dL — ABNORMAL LOW (ref 3.5–5.0)
Alkaline Phosphatase: 252 U/L — ABNORMAL HIGH (ref 38–126)
Anion gap: 6 (ref 5–15)
BUN: 27 mg/dL — ABNORMAL HIGH (ref 8–23)
CO2: 23 mmol/L (ref 22–32)
Calcium: 7.6 mg/dL — ABNORMAL LOW (ref 8.9–10.3)
Chloride: 113 mmol/L — ABNORMAL HIGH (ref 98–111)
Creatinine, Ser: 1.35 mg/dL — ABNORMAL HIGH (ref 0.44–1.00)
GFR calc Af Amer: 42 mL/min — ABNORMAL LOW (ref >60)
GFR calc non Af Amer: 36 mL/min — ABNORMAL LOW (ref >60)
Glucose, Bld: 135 mg/dL — ABNORMAL HIGH (ref 70–99)
Potassium: 4.5 mmol/L (ref 3.5–5.1)
Sodium: 142 mmol/L (ref 135–145)
Total Bilirubin: 1 mg/dL (ref 0.3–1.2)
Total Protein: 5.3 g/dL — ABNORMAL LOW (ref 6.5–8.1)

## 2020-03-18 LAB — GLUCOSE, CAPILLARY
Glucose-Capillary: 121 mg/dL — ABNORMAL HIGH (ref 70–99)
Glucose-Capillary: 165 mg/dL — ABNORMAL HIGH (ref 70–99)
Glucose-Capillary: 81 mg/dL (ref 70–99)
Glucose-Capillary: 131 mg/dL — ABNORMAL HIGH (ref 70–99)

## 2020-03-18 LAB — LIPASE, BLOOD: Lipase: 14 U/L (ref 11–51)

## 2020-03-18 NOTE — Plan of Care (Signed)

## 2020-03-18 NOTE — Progress Notes (Signed)
PROGRESS NOTE    Whitney [name]  OJJ:[id number] DOB: [birth year] DOA: 03/02/2020 PCP: Patient, No Pcp Per   Chief Complaint  Patient presents with  . Hip Injury    Brief Narrative:HPI on 03/02/2020 by Dr. Verlin Grills a 85 y.o.femalewith medical history significant forinsulin-dependent diabetes mellitus, chronic kidney disease stage III, and right hip fracture more than a month ago, now presenting to the emergency department with increasing right hip pain despite taking oxycodone and gabapentin at home. Patient is accompanied by her daughter who assists with the history. Patient previously lived in [place] but had since been in [place] where she suffered a right hip fracture more than a month ago. She was seen in a hospital in [place] where she was treated with some pain medications and Lovenox for 4 flying to the [place]. She was briefly admitted to a hospital in [place] several days ago where she was seen by orthopedic surgery and discharged home with plans for outpatient follow-up. She has since come to [place], continues to use oxycodone and gabapentin, but has been experiencing increasing pain in the right hip. She denied any fevers, chills, chest pain, cough, or shortness of breath.  5/6 -resting in bed in [place] .  5/7 no new events    Assessment & Plan:   Principal Problem:   Intractable pain Active Problems:   Diabetes mellitus without complication (HCC)   CKD (chronic kidney disease), stage III   Closed right hip fracture (HCC)   Protein calorie malnutrition (HCC)   Bilateral leg edema   Closed right hip fracture, initial encounter ([place])   #1 right hip fracture a month ago status post right hip Girdlestone procedure on 03/04/2020. Follow-up with Dr. Rigoberto Noel in 2 weeks. Seen by physical therapy recommending SNF. Also recommending continuation of splint and elevation of the right lower extremity and nonweightbearing to the right lower  extremity.  #2 acute urinary retention Foley catheter placed by urology. Follow-up with urology for outpatient cystoscopy. Continue Flomax.  #3 acute metabolic encephalopathy-normal ammonia normal B12 and TSH. CT of the head shows chronic small vessel disease without any acute abnormality. Gabapentin was stopped. Continue to follow her closely. Previous physician discussed with Dr. Lorraine Lax who recommended MRI and possibly doing an EEG. Her daughter wanted to hold off on that at this time. Her mental status is improved. She is able to speak little bit of English.  #4 hyperkalemia she received calcium gluconate insulin dextrose Lokelma and Kayexalate. Potassium5.1.  Follow-up labs pending#5 CKD stage IIIb at baselinecreatinine 1.29  #6 type 2 diabetes continue Lantus 5 units nightly and SSI  CBG (last 3)  Recent Labs    03/17/20 1658 03/17/20 2200 03/18/20 0817  GLUCAP 180* 134* 121*   #7 chronic bilateral lower extremity edema Echo with normal ejection fraction patient is bedbound at home prior to admission. Likely due to third spacing from hypoalbuminemia. Albumin 1.4  Consulted  dietary  #8 constipation resolved now patient has diarrhea.  DC stool softeners and laxatives.    #9 goals of care discussed with patient and daughter patient is DNR/DNI.  DVT ProphylaxisLovenox  Code Status:DNR/DNI Family Communication:dw daughter 5/8 Disposition Plan: Status is: Inpatient, patient is stable for discharge however she has unsafe discharge plan. She lives on the third floor of the apartment and does not have an elevator. She will not be able to walk up the stairs to get to her apartment neither would she be able to leave the apartment in  case of an emergency. PT has seen her and recommended SNF. She does not have any bed offers. Discussed with case Freight forwarder. Dispo: The patient is from: Home Anticipated d/c is to:snf Anticipated d/c date  KX:FGHWEXH Patient currentlyis medically stable to d/c  Consultants Orthopedics Urology  Procedures Echocardiogram Right hip Girdlestone procedure by orthopedics on 03/05/2020 Right leg splint 03/05/2020 Foley catheter placement by urology 03/08/2020  ANTIMICROBIALS- Anti-infectives (From admission, onward)   Start     Dose/Rate Route Frequency Ordered Stop   03/09/20 1030  ciprofloxacin (CIPRO) tablet 250 mg  Status:  Discontinued     250 mg Oral Daily with breakfast 03/09/20 1020 03/09/20 1200   03/08/20 2000  ciprofloxacin (CIPRO) tablet 250 mg  Status:  Discontinued     250 mg Oral 2 times daily 03/08/20 1753 03/09/20 1020   03/05/20 2100  ceFAZolin (ANCEF) IVPB 2g/100 mL premix     2 g 200 mL/hr over 30 Minutes Intravenous Every 8 hours 03/05/20 2043 03/06/20 0547   03/05/20 0818  vancomycin (VANCOCIN) powder  Status:  Discontinued       As needed 03/05/20 0819 03/05/20 0913   03/05/20 0600  ceFAZolin (ANCEF) IVPB 2g/100 mL premix  Status:  Discontinued     2 g 200 mL/hr over 30 Minutes Intravenous On call to O.R. 03/04/20 1713 03/05/20 1055     Subjective: RESTING IN BED  Discussed with daughter patient complaining of abdominal pain and had loose bowel movements this morning.  Daughter brought in food that she did not want to eat today.  However she ate the food daughter brought in last evening.  Discussed CODE STATUS with patient and daughter and they want DNR DNI.  Objective: Vitals:   03/17/20 0927 03/17/20 1452 03/17/20 2028 03/18/20 0533  BP: (!) 115/57 119/60 121/67 (!) 119/50  Pulse: 71 72 86 93  Resp: 17 18 14 16   Temp: (!) 97.5 F (36.4 C) 97.6 F (36.4 C) 98.6 F (37 C) 98.3 F (36.8 C)  TempSrc: Oral Oral Oral Oral  SpO2: 92% 95% 95% 98%  Weight:      Height:        Intake/Output Summary (Last 24 hours) at 03/18/2020 1130 Last data filed at 03/18/2020 0534 Gross per 24 hour  Intake 440 ml  Output 1050 ml  Net -610 ml   Filed  Weights   03/03/20 0421 03/03/20 0838  Weight: 79.7 kg 79.7 kg    Examination:  General exam: Appears calm and comfortable  Respiratory system: Clear to auscultation. Respiratory effort normal. Cardiovascular system: S1 & S2 heard, RRR. No JVD, murmurs, rubs, gallops or clicks. No pedal edema. Gastrointestinal system: Abdomen is nondistended, soft and nontender. No organomegaly or masses felt. Normal bowel sounds heard. Central nervous system: Alert and oriented. No focal neurological deficits. Extremities: Bilateral lower extremity edema all the way up to the upper thighs  skin: No rashes, lesions or ulcers Psychiatry: Judgement and insight appear normal. Mood & affect appropriate.     Data Reviewed: I have personally reviewed following labs and imaging studies  CBC: Recent Labs  Lab 03/12/20 0620 03/13/20 0300 03/14/20 0640 03/15/20 0553  WBC 5.4  --   --  8.1  HGB 10.0* 9.7* 9.7* 10.3*  HCT 32.6* 31.7* 30.8* 33.3*  MCV 98.5  --   --  100.3*  PLT 130*  --   --  371    Basic Metabolic Panel: Recent Labs  Lab 03/12/20 0620 03/13/20 0300 03/14/20 0640 03/15/20  0553  NA 145 144 140 140  K 4.8 4.7 4.5 5.1  CL 117* 116* 114* 110  CO2 23 24 23  21*  GLUCOSE 127* 145* 61* 126*  BUN 40* 36* 33* 31*  CREATININE 1.35* 1.36* 1.19* 1.29*  CALCIUM 7.7* 7.5* 7.6* 7.6*    GFR: Estimated Creatinine Clearance: 32.9 mL/min (A) (by C-G formula based on SCr of 1.29 mg/dL (H)).  Liver Function Tests: No results for input(s): AST, ALT, ALKPHOS, BILITOT, PROT, ALBUMIN in the last 168 hours.  CBG: Recent Labs  Lab 03/17/20 0750 03/17/20 1144 03/17/20 1658 03/17/20 2200 03/18/20 0817  GLUCAP 171* 167* 180* 134* 121*     Recent Results (from the past 240 hour(s))  Culture, Urine     Status: None   Collection Time: 03/08/20  5:50 PM   Specimen: Urine, Clean Catch  Result Value Ref Range Status   Specimen Description URINE, CLEAN CATCH  Final   Special Requests NONE   Final   Culture   Final    NO GROWTH Performed at [facility], [place] [address], [place], [place] [address]    Report Status 03/09/2020 FINAL  Final         Radiology Studies: No results found.      Scheduled Meds: . bisacodyl  10 mg Rectal Daily  . Chlorhexidine Gluconate Cloth  6 each Topical Daily  . docusate sodium  100 mg Oral BID  . insulin aspart  0-5 Units Subcutaneous QHS  . insulin aspart  0-9 Units Subcutaneous TID WC  . insulin glargine  5 Units Subcutaneous QHS  . LORazepam  1 mg Intravenous Once  . multivitamin with minerals  1 tablet Oral Daily  . polyethylene glycol  17 g Oral Daily  . sodium chloride flush  3 mL Intravenous Q12H  . tamsulosin  0.4 mg Oral Daily   Continuous Infusions: . sodium chloride    . methocarbamol (ROBAXIN) IV       LOS: 15 days     Georgette Shell, MD [facility]   To contact the attending provider between 7A-7P or the covering provider during after hours 7P-7A, please log into the web site [email or web address] and access using universal [place] password for that web site. If you do not have the password, please call the hospital operator.  03/18/2020, 11:30 AM

## 2020-03-18 NOTE — Consult Note (Signed)
Consultation Note Date: 03/18/2020   Patient Name: Whitney [name]  DOB: [birth year]  MRN: [id number]  Age / Sex: 85 y.o., female  PCP: Patient, No Pcp Per Referring Physician: Georgette Shell, MD  Reason for Consultation: Establishing goals of care  HPI/Patient Profile: 85 y.o. female  with past medical history of IDDM, CKD stage III and right hip fracture 1 month ago in [place]. She was treated in a hospital in [place] with pain medication and Lovenox to fly to the [place] to be with her daughter. Upon arrival patient was seen at a hospital in [place] but family choose to bring her to [place] where her daughter lives for definitive treatment of increasing right hip pain. She was admitted on 03/02/2020 with complaints of increasing right hip pain.She had a Girdlestone procedure on 03/05/20 with a intraoperative nondisplaced  sprial fracture of the distal tibial shaft for which a splint was placed. She is nonweightbearing right lower extremity.  She had been nonambulatory for 8 years following a left hip replacement 8 years ago.   Clinical Assessment and Goals of Care: I have reviewed VS, medical records including Epic notes, labs and imaging and assessed the patients.   I met with the patient and her daughter Whitney [name] to discuss diagnosis, prognosis, GOC, EOL wishes, disposition and options. Whitney [name] only speaks Arabic although she can say a few words like "thank you" in [place]. Of cultural importance is: She is a practicing Muslim and cannot eat pork. She prefers that no men provide personal care for her.   I introduced Palliative Medicine as specialized medical care for people living with serious illness. It focuses on providing relief from the symptoms and stress of a serious illness. The goal is to improve quality of life for both the patient and the family.  I asked Whitney [name] to tell me about herself. She  is from [place] but came to the [place] in 2006 and lived in [place]. She is a permanent [place] resident but never applied for citizenship. She went back to [place] in 2014 and has been living there. She is widowed. Her husband died when she was 71. She has 6 children. One daughter is in [place] and sons are in [place]. The daughter lives in [place] but is presently unemployed. 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 They have no other family here. The daughter is applying for Medicare for her mother.  Finances are a large stressor. Whitney [name] never worked in the [place]. Some cousins sent some money to help them out but the daughter states it is not much. She is hopeful that some refugee agency will help provide services for Whitney [name].  Ideally placement would be in a SNF but the Pay[employer] source [place] and the language barrier are an issue. The current plan is for the daughter to move from a third floor apartment to a first floor apartment in the same complex. Whitney [name] states the case manager is working with them to help  the manager expedite the move. Whitney [name] will need minimally a hospital bed, a lift, trapeze,wheelchair, and bedside commode at home as well as home care services. It currently requires at least 2 people  to just turn Whitney [name]. The daughter was tearful during our conversation. Emotional support was provided.   We discussed the importance of continued conversation with family and the medical providers regarding overall plan of care and treatment options, ensuring decisions are within the context of the patient's values and GOC.   Primary Decision maker: PATIENT, her daughter if she cannot make decisions.     SUMMARY OF RECOMMENDATIONS    Code Status/Advance Care Planning:  DNR if full cardioplumonary arrest. If she has a pulse or is breathing they would like resuscitation as culturally they believe if you have a pulse or are breathing you are alive and everything should be done.  MOST  form was completed and placed in media. It is in her chart on the unit. She wishes to receive antibiotics, IV fluids and feeding tube  if indicated.    Symptom Management:   Pain- currently pain at rest is a 0.  Palliative Prophylaxis:  Frequent Pain Assessment Monitoring of BMs. She has had some diarrhea and is refusing stool softener.   Additional Recommendations (Limitations, Scope, Preferences):  Full Scope Treatment  Psycho-social/Spiritual:   Desire for further Chaplaincy support:no. Daughter states they are not going to the [facility] here.  Additional Recommendations: Caregiving  Support/Resources and Referral to [employer]   Prognosis:   Unable to determine  Discharge Planning: Home with Home Health      Primary Diagnoses: Present on Admission: . Intractable pain . CKD (chronic kidney disease), stage III . Closed right hip fracture ([place]) . Protein calorie malnutrition ([place]) . Bilateral leg edema . Closed right hip fracture, initial encounter ([place])     No Known Allergies Review of Systems  Constitutional: Negative for appetite change and fatigue.  HENT: Negative.   Eyes: Positive for visual disturbance.  Respiratory: Negative.   Cardiovascular: Negative.   Gastrointestinal: Positive for diarrhea and nausea.  Genitourinary: Negative for decreased urine volume.  Musculoskeletal: Positive for arthralgias and joint swelling.  Skin: Negative.   Allergic/Immunologic: Negative.   Neurological: Negative.   Hematological: Negative.   Psychiatric/Behavioral: Negative.     Physical Exam Constitutional:      Appearance: Normal appearance. She is obese.  HENT:     Head: Normocephalic and atraumatic.     Nose: Nose normal.     Mouth/Throat:     Mouth: Mucous membranes are moist.     Pharynx: Oropharynx is clear.  Cardiovascular:     Rate and Rhythm: Normal rate and regular rhythm.     Pulses: Normal pulses.     Heart sounds: Normal heart sounds.    Pulmonary:     Effort: Pulmonary effort is normal.     Breath sounds: Normal breath sounds.  Abdominal:     General: Bowel sounds are normal. There is no distension.     Palpations: Abdomen is soft.     Tenderness: There is no abdominal tenderness. There is no guarding.  Musculoskeletal:        General: Swelling and tenderness present.     Comments: Right hip, right LE cast, can wiggle toes bilaterally, Spontaneous movementUE  Skin:    General: Skin is warm and dry.     Capillary Refill: Capillary refill takes less than 2 seconds.  Neurological:     General:  No focal deficit present.     Mental Status: She is alert and oriented to person, place, and time.     Comments: With daughter interpreting     Vital Signs: BP (!) 119/50 (BP Location: Left Arm)   Pulse 93   Temp 98.3 F (36.8 C) (Oral)   Resp 16   Ht 5' 2"  (1.575 m)   Wt 79.7 kg   SpO2 98%   BMI 32.14 kg/m  Pain Scale: Faces POSS *See Group Information*: 1-Acceptable,Awake and alert Pain Score: 0-No pain   SpO2: SpO2: 98 % O2 Device:SpO2: 98 % O2 Flow Rate: .O2 Flow Rate (L/min): 2 L/min   LBM: Last BM Date: 03/18/20 Baseline Weight: Weight: 79.7 kg Most recent weight: Weight: 79.7 kg     Palliative Assessment/Data:30%     Time In: 10:35 Time Out: 1155 Time Total: 75 minutes Greater than 50%  of this time was spent counseling and coordinating care related to the above assessment and plan.  Signed by: Donalynn Furlong, NP   [place] Team Team Cell phone [phone] Please utilize secure chat with additional questions, if there is no response within 30 minutes please call the above phone number.   Palliative Medicine providers are available by phone from 7 am to 7 pm daily and can be reached through the team cell phone. Should this patient require assistance outside of these hours, please call the patient's attending physician.  For individual provider: See  Shea Evans

## 2020-03-19 ENCOUNTER — Inpatient Hospital Stay (HOSPITAL_COMMUNITY): Payer: 59

## 2020-03-19 LAB — GLUCOSE, CAPILLARY
Glucose-Capillary: 98 mg/dL (ref 70–99)
Glucose-Capillary: 95 mg/dL (ref 70–99)
Glucose-Capillary: 144 mg/dL — ABNORMAL HIGH (ref 70–99)
Glucose-Capillary: 145 mg/dL — ABNORMAL HIGH (ref 70–99)

## 2020-03-19 LAB — CREATININE, SERUM
Creatinine, Ser: 1.47 mg/dL — ABNORMAL HIGH (ref 0.44–1.00)
GFR calc Af Amer: 38 mL/min — ABNORMAL LOW (ref >60)
GFR calc non Af Amer: 33 mL/min — ABNORMAL LOW (ref >60)

## 2020-03-19 MED ORDER — CALCITONIN (SALMON) 200 UNIT/ACT NA SOLN
1.0000 | Freq: Every day | NASAL | Status: DC
  Administered 2020-03-19 – 2020-03-30 (×11): 1 via NASAL
  Filled 2020-03-19 (×2): qty 3.7

## 2020-03-19 NOTE — Progress Notes (Signed)
Orthopedic Tech Progress Note Patient Details:  Whitney [name] [birth year] [id number]  Ortho Devices Type of Ortho Device: Short leg splint Ortho Device/Splint Interventions: Application   Post Interventions Patient Tolerated: Poor   Majel Homer 03/19/2020, 2:29 PM

## 2020-03-19 NOTE — Progress Notes (Signed)
I have reviewed the xrays of her left ankle.  Given her nonambulatory state and severe osteoporosis, we will treat this new fracture nonoperatively as well with short leg splint.  Pain control per primary team.  Whitney Cecil, MD [facility] [phone] 8:05 PM

## 2020-03-19 NOTE — Plan of Care (Signed)
  Problem: Education: Goal: Verbalization of understanding the information provided (i.e., activity precautions, restrictions, etc) will improve Outcome: Progressing   Problem: Clinical Measurements: Goal: Postoperative complications will be avoided or minimized Outcome: Progressing   Problem: Pain Management: Goal: Pain level will decrease Outcome: Progressing   

## 2020-03-19 NOTE — Progress Notes (Signed)
    Palliative Medicine Inpatient Follow Up Note  Dropped information at bedside for patients daughter.  Refugee assistance in [place] [address] [place], [place] [address] [phone] [employer]  Center for [facility] for [facility] [place], [place], [place] [address] [phone] [email or web address].dk  C[facility] ([place]) [place] [address] [place], [place] [address] [phone] They have legal assistance also PlasmaBike.fr  Link to [place] refugee assistance programs: https://files.https://www.johnston.biz/.pdf  No Charge ______________________________________________________________________________________ [place] Team Team Cell Phone: [phone] Please utilize secure chat with additional questions, if there is no response within 30 minutes please call the above phone number  Palliative Medicine Team providers are available by phone from 7am to 7pm daily and can be reached through the team cell phone.  Should this patient require assistance outside of these hours, please call the patient's attending physician.

## 2020-03-19 NOTE — Hospital Course (Signed)
     Subjective: 14 Days Post-Op Procedure(s) (LRB): HIP GIRDLE STONE (Right) 85 year old female with history of right hip fracture, poor health not considered a surgical candidate. She was tranferred from  [place] to [facility] and has been seen by my partner Dr. Erlinda Hong. She has not been an ambulator for sometime and was diagnosed with right distal tibia and fibula fractures late last month. This is being treated with a short leg posterior splint. She is experiencing left ankle pain and most recent radiographs show a new left distal tibia and fibula fracture similar area distal metaphyseal-diaphyseal with minimal angulation.  Patient reports pain as moderate.    Objective:   VITALS:  Temp:  [99.1 F (37.3 C)-99.3 F (37.4 C)] 99.1 F (37.3 C) (05/09 0541) Pulse Rate:  [80-88] 80 (05/09 0541) Resp:  [14-16] 16 (05/09 0541) BP: (105-110)/(34-46) 110/46 (05/09 0541) SpO2:  [91 %-93 %] 93 % (05/09 0541)  Neurologically intact ABD soft Neurovascular intact Sensation intact distally Intact pulses distally Dorsiflexion/Plantar flexion intact Compartment soft   LABS Recent Labs    03/18/20 1110  HGB 10.0*  WBC 8.7  PLT 241   Recent Labs    03/18/20 1110 03/19/20 0508  NA 142  --   K 4.5  --   CL 113*  --   CO2 23  --   BUN 27*  --   CREATININE 1.35* 1.47*  GLUCOSE 135*  --    No results for input(s): LABPT, INR in the last 72 hours.   Assessment/Plan: 14 Days Post-Op Procedure(s) (LRB): HIP GIRDLE STONE (Right) Left closed distal metaphyseoepiphyseal fracture,angulated minimally displace.   Advance diet Likely sever osteoporosis due to prolong non weight bearing,  Vitamin deficiency and poor nutrition.  Should start Miacalcin nasal spray alternating nostrils daily. VItamin d level and start supplements if indicated and calcium  Supplements. Order an left leg short leg posterior splint from [facility].  Will advise Dr. Erlinda Hong of the new fractures.   Basil Dess 03/19/2020,  1:41 PM

## 2020-03-19 NOTE — Progress Notes (Signed)
Patient ID: Whitney [name], female   DOB: 1935-09-16, 85 y.o.   MRN: [id number]     Subjective: 14 Days Post-Op Procedure(s) (LRB): HIP GIRDLE STONE (Right) 85 year old female with history of right hip fracture, poor health not considered a surgical candidate. She was tranferred from  [place] to [facility] and has been seen by my partner Dr. Erlinda Hong. She has not been an ambulator for sometime and was diagnosed with right distal tibia and fibula fractures late last month. This is being treated with a short leg posterior splint. She is experiencing left ankle pain and most recent radiographs show a new left distal tibia and fibula fracture similar area distal metaphyseal-diaphyseal with minimal angulation.  Patient reports pain as moderate.    Objective:   VITALS:  Temp:  [99.1 F (37.3 C)-99.3 F (37.4 C)] 99.1 F (37.3 C) (05/09 0541) Pulse Rate:  [80-88] 80 (05/09 0541) Resp:  [14-16] 16 (05/09 0541) BP: (105-110)/(34-46) 110/46 (05/09 0541) SpO2:  [91 %-93 %] 93 % (05/09 0541)  Neurologically intact ABD soft Neurovascular intact Sensation intact distally Intact pulses distally Dorsiflexion/Plantar flexion intact Compartment soft   LABS Recent Labs    03/18/20 1110  HGB 10.0*  WBC 8.7  PLT 241   Recent Labs    03/18/20 1110 03/19/20 0508  NA 142  --   K 4.5  --   CL 113*  --   CO2 23  --   BUN 27*  --   CREATININE 1.35* 1.47*  GLUCOSE 135*  --    No results for input(s): LABPT, INR in the last 72 hours.   Assessment/Plan: 14 Days Post-Op Procedure(s) (LRB): HIP GIRDLE STONE (Right) Left closed distal metaphyseoepiphyseal fracture,angulated minimally displace.   Advance diet Likely sever osteoporosis due to prolong non weight bearing,  Vitamin deficiency and poor nutrition.  Should start Miacalcin nasal spray alternating nostrils daily. VItamin d level and start supplements if indicated and calcium  Supplements. Order an left leg short leg posterior splint from  [facility].  Will advise Dr. Erlinda Hong of the new fractures.   Basil Dess 03/19/2020, 1:41 PM

## 2020-03-19 NOTE — Progress Notes (Addendum)
PROGRESS NOTE    Whitney [name]  GBT:[id number] DOB: 02-05-1935 DOA: 03/02/2020 PCP: Patient, No Pcp Per  Brief Narrative: HPI on 03/02/2020 by Dr. Verlin Grills a 85 y.o.femalewith medical history significant forinsulin-dependent diabetes mellitus, chronic kidney disease stage III, and right hip fracture more than a month ago, now presenting to the emergency department with increasing right hip pain despite taking oxycodone and gabapentin at home. Patient is accompanied by her daughter who assists with the history. Patient previously lived in [place] but had since been in [place] where she suffered a right hip fracture more than a month ago. She was seen in a hospital in [place] where she was treated with some pain medications and Lovenox for 4 flying to the [place]. She was briefly admitted to a hospital in [place] several days ago where she was seen by orthopedic surgery and discharged home with plans for outpatient follow-up. She has since come to [place], continues to use oxycodone and gabapentin, but has been experiencing increasing pain in the right hip. She denied any fevers, chills, chest pain, cough, or shortness of breath.  5/9-complaining of left foot pain very tender when I lifted her left foot up  Assessment & Plan:   Principal Problem:   Intractable pain Active Problems:   Diabetes mellitus without complication (HCC)   CKD (chronic kidney disease), stage III   Closed right hip fracture (HCC)   Protein calorie malnutrition (HCC)   Bilateral leg edema   Closed right hip fracture, initial encounter ([facility])     #1 right hip fracture a month ago status post right hip Girdlestone procedure on 03/04/2020. Follow-up with Dr. Erlinda Hong in 2 weeks. Seen by physical therapy recommending SNF. Also recommending continuation of splint and elevation of the right lower extremity and nonweightbearing to the right lower extremity.  #2 acute urinary retention  Foley catheter placed by urology. Follow-up with urology for outpatient cystoscopy. Continue Flomax.  #3 acute metabolic encephalopathy-normal ammonia normal B12 and TSH. CT of the head shows chronic small vessel disease without any acute abnormality. Gabapentin was stopped. Continue to follow her closely. Previous physician discussed with Dr. Lorraine Lax who recommended MRI and possibly doing an EEG. Her daughter wanted to hold off on that at this time. Her mental status is improved. She is able to speak little bit of English.  #4 hyperkalemia she received calcium gluconate insulin dextrose Lokelma and Kayexalate. Potassium5.1.  Follow-up labs pending#5 CKD stage IIIb at baselinecreatinine 1.29  #6 type 2 diabetes continue Lantus 5 units nightly and SSI  #7 chronic bilateral lower extremity edema Echo with normal ejection fraction patient is bedbound at home prior to admission. Likely due to third spacing from hypoalbuminemia. Albumin 1.4  Consulted  dietary  #8 constipation resolved now patient has diarrhea.  DC stool softeners and laxatives.    #9 goals of care discussed with patient and daughter patient is DNR/DNI.  #10 left foot pain with swelling check x-ray of the left foot  DVT ProphylaxisLovenox  Code Status:DNR/DNI Family Communication:dw daughter 5/8 Disposition Plan: Status is: Inpatient, patient is stable for discharge however she has unsafe discharge plan. She lives on the third floor of the apartment and does not have an elevator. She will not be able to walk up the stairs to get to her apartment neither would she be able to leave the apartment in case of an emergency. PT has seen her and recommended SNF. She does not have any bed offers. Discussed with  case Freight forwarder. Dispo: The patient is from: Home Anticipated d/c is to:snf/case manager looking into having the apartment complex give her an apartment on the ground level so she does not  have to climb steps. Anticipated d/c date WJ:[id number] Patient currentlyis medically stable to d/c  Consultants Orthopedics Urology  Procedures Echocardiogram Right hip Girdlestone procedure by orthopedics on 03/05/2020 Right leg splint 03/05/2020 Foley catheter placement by urology 03/08/2020  ANTIMICROBIALS- Anti-infectives (From admission, onward)   Start     Dose/Rate Route Frequency Ordered Stop   03/09/20 1030  ciprofloxacin (CIPRO) tablet 250 mg  Status:  Discontinued     250 mg Oral Daily with breakfast 03/09/20 1020 03/09/20 1200   03/08/20 2000  ciprofloxacin (CIPRO) tablet 250 mg  Status:  Discontinued     250 mg Oral 2 times daily 03/08/20 1753 03/09/20 1020   03/05/20 2100  ceFAZolin (ANCEF) IVPB 2g/100 mL premix     2 g 200 mL/hr over 30 Minutes Intravenous Every 8 hours 03/05/20 2043 03/06/20 0547   03/05/20 0818  vancomycin (VANCOCIN) powder  Status:  Discontinued       As needed 03/05/20 0819 03/05/20 0913   03/05/20 0600  ceFAZolin (ANCEF) IVPB 2g/100 mL premix  Status:  Discontinued     2 g 200 mL/hr over 30 Minutes Intravenous On call to O.R. 03/04/20 1713 03/05/20 1055      Nutrition Problem: Increased nutrient needs Etiology: wound healing     Signs/Symptoms: estimated needs    Interventions: Prostat, MVI  Estimated body mass index is 32.14 kg/m as calculated from the following:   Height as of this encounter: 5\' 2"  (1.575 m).   Weight as of this encounter: 79.7 kg.   Subjective:  Resting in bed c/o right foot pain.. Objective: Vitals:   03/18/20 0533 03/18/20 1228 03/18/20 1955 03/19/20 0541  BP: (!) 119/50 (!) 130/47 (!) 105/34 (!) 110/46  Pulse: 93 85 88 80  Resp: 16 16 14 16   Temp: 98.3 F (36.8 C) 98.9 F (37.2 C) 99.3 F (37.4 C) 99.1 F (37.3 C)  TempSrc: Oral Oral Oral Oral  SpO2: 98% 100% 91% 93%  Weight:      Height:        Intake/Output Summary (Last 24 hours) at 03/19/2020 1006 Last data  filed at 03/18/2020 1736 Gross per 24 hour  Intake --  Output 350 ml  Net -350 ml   Filed Weights   03/03/20 0421 03/03/20 0838  Weight: 79.7 kg 79.7 kg    Examination:  General exam: Appears calm and comfortable  Respiratory system: Clear to auscultation. Respiratory effort normal. Cardiovascular system: S1 & S2 heard, RRR. No JVD, murmurs, rubs, gallops or clicks. No pedal edema. Gastrointestinal system: Abdomen is nondistended, soft and nontender. No organomegaly or masses felt. Normal bowel sounds heard. Central nervous system: Alert and oriented. No focal neurological deficits. Extremities: 2 plus edema,left foot tender to touch good rom to great toe Skin: No rashes, lesions or ulcers Psychiatry: Judgement and insight appear normal. Mood & affect appropriate.     Data Reviewed: I have personally reviewed following labs and imaging studies  CBC: Recent Labs  Lab 03/13/20 0300 03/14/20 0640 03/15/20 0553 03/18/20 1110  WBC  --   --  8.1 8.7  HGB 9.7* 9.7* 10.3* 10.0*  HCT 31.7* 30.8* 33.3* 33.0*  MCV  --   --  100.3* 100.0  PLT  --   --  181 956   Basic Metabolic Panel: Recent Labs  Lab 03/13/20 0300 03/14/20 0640 03/15/20 0553 03/18/20 1110 03/19/20 0508  NA 144 140 140 142  --   K 4.7 4.5 5.1 4.5  --   CL 116* 114* 110 113*  --   CO2 24 23 21* 23  --   GLUCOSE 145* 61* 126* 135*  --   BUN 36* 33* 31* 27*  --   CREATININE 1.36* 1.19* 1.29* 1.35* 1.47*  CALCIUM 7.5* 7.6* 7.6* 7.6*  --    GFR: Estimated Creatinine Clearance: 28.8 mL/min (A) (by C-G formula based on SCr of 1.47 mg/dL (H)). Liver Function Tests: Recent Labs  Lab 03/18/20 1110  AST 29  ALT 13  ALKPHOS 252*  BILITOT 1.0  PROT 5.3*  ALBUMIN 1.6*   Recent Labs  Lab 03/18/20 1110  LIPASE 14   No results for input(s): AMMONIA in the last 168 hours. Coagulation Profile: No results for input(s): INR, PROTIME in the last 168 hours. Cardiac Enzymes: No results for input(s): CKTOTAL,  CKMB, CKMBINDEX, TROPONINI in the last 168 hours. BNP (last 3 results) No results for input(s): PROBNP in the last 8760 hours. HbA1C: No results for input(s): HGBA1C in the last 72 hours. CBG: Recent Labs  Lab 03/18/20 0817 03/18/20 1224 03/18/20 1726 03/18/20 2005 03/19/20 0906  GLUCAP 121* 165* 81 131* 98   Lipid Profile: No results for input(s): CHOL, HDL, LDLCALC, TRIG, CHOLHDL, LDLDIRECT in the last 72 hours. Thyroid Function Tests: No results for input(s): TSH, T4TOTAL, FREET4, T3FREE, THYROIDAB in the last 72 hours. Anemia Panel: No results for input(s): VITAMINB12, FOLATE, FERRITIN, TIBC, IRON, RETICCTPCT in the last 72 hours. Sepsis Labs: No results for input(s): PROCALCITON, LATICACIDVEN in the last 168 hours.  No results found for this or any previous visit (from the past 240 hour(s)).       Radiology Studies: DG Abd 1 View  Result Date: 03/18/2020 CLINICAL DATA:  Nausea and abdominal pain with loose bowel movement EXAM: ABDOMEN - 1 VIEW COMPARISON:  Abdominal CT 03/09/2020 FINDINGS: Gaseous distension of small bowel loops without over distension. Some colonic stool and gas is also noted. No change from prior abdominal CT. Marked osteopenia with remote L1 compression fracture and left hip arthroplasty with acetabular cement which extends beyond the medial bony confines. IVC filter in place. Reticulation in the lower lungs which was also seen on prior IMPRESSION: Stable compared to abdominal CT last week. Nonobstructive bowel gas pattern. Electronically Signed   By: Monte Fantasia M.D.   On: 03/18/2020 16:03        Scheduled Meds: . Chlorhexidine Gluconate Cloth  6 each Topical Daily  . insulin aspart  0-5 Units Subcutaneous QHS  . insulin aspart  0-9 Units Subcutaneous TID WC  . insulin glargine  5 Units Subcutaneous QHS  . LORazepam  1 mg Intravenous Once  . multivitamin with minerals  1 tablet Oral Daily  . sodium chloride flush  3 mL Intravenous Q12H  .  tamsulosin  0.4 mg Oral Daily   Continuous Infusions: . sodium chloride    . methocarbamol (ROBAXIN) IV       LOS: 16 days     Georgette Shell, MD  03/19/2020, 10:06 AM

## 2020-03-20 LAB — GLUCOSE, CAPILLARY
Glucose-Capillary: 157 mg/dL — ABNORMAL HIGH (ref 70–99)
Glucose-Capillary: 131 mg/dL — ABNORMAL HIGH (ref 70–99)
Glucose-Capillary: 135 mg/dL — ABNORMAL HIGH (ref 70–99)
Glucose-Capillary: 169 mg/dL — ABNORMAL HIGH (ref 70–99)

## 2020-03-20 NOTE — Progress Notes (Signed)
PROGRESS NOTE    Whitney [name]  JOA:[id number] DOB: 02-11-1935 DOA: 03/02/2020 PCP: Patient, No Pcp Per    Brief Narrative:HPI on 03/02/2020 by Dr. Verlin Grills a 85 y.o.femalewith medical history significant forinsulin-dependent diabetes mellitus, chronic kidney disease stage III, and right hip fracture more than a month ago, now presenting to the emergency department with increasing right hip pain despite taking oxycodone and gabapentin at home. Patient is accompanied by her daughter who assists with the history. Patient previously lived in [place] but had since been in [place] where she suffered a right hip fracture more than a month ago. She was seen in a hospital in [place] where she was treated with some pain medications and Lovenox for 4 flying to the [place]. She was briefly admitted to a hospital in [place] several days ago where she was seen by orthopedic surgery and discharged home with plans for outpatient follow-up. She has since come to [place], continues to use oxycodone and gabapentin, but has been experiencing increasing pain in the right hip. She denied any fevers, chills, chest pain, cough, or shortness of breath.  5/9-complaining of left foot pain very tender when I lifted her left foot up  Assessment & Plan:   Principal Problem:   Intractable pain Active Problems:   Diabetes mellitus without complication (HCC)   CKD (chronic kidney disease), stage III   Closed right hip fracture (HCC)   Protein calorie malnutrition (HCC)   Bilateral leg edema   Closed right hip fracture, initial encounter ([facility])   #1 right hip fracture a month ago status post right hip Girdlestone procedure on 03/04/2020. Follow-up with Dr. Erlinda Hong in 2 weeks. Seen by physical therapy recommending SNF. Also recommending continuation of splint and elevation of the right lower extremity and nonweightbearing to the right lower extremity.  Mildly displaced oblique  fractures of the distal tibia and fibula.  Lucency at the site of fibular fracture raises suspicion for pathological fracture.  Severe osteopenia.  Ortho reconsulted recommends nonsurgical management as patient is nonweightbearing and non ambulatory and severe osteoporosis  #2 acute urinary retention Foley catheter placed by urology. Follow-up with urology for outpatient cystoscopy. Continue Flomax.  #3 acute metabolic encephalopathy-normal ammonia normal B12 and TSH. CT of the head shows chronic small vessel disease without any acute abnormality. Gabapentin was stopped. Continue to follow her closely. Previous physician discussed with Dr. Lorraine Lax who recommended MRI and possibly doing an EEG. Her daughter wanted to hold off on that at this time. Her mental status is improved. She is able to speak little bit of English.  #4 hyperkalemia she received calcium gluconate insulin dextrose Lokelma and Kayexalate. Potassium5.1. Follow-up labs pending#5 CKD stage IIIb at baselinecreatinine 1.29  #6 type 2 diabetes continue Lantus5 units nightlyand SSI  #7 chronic bilateral lower extremity edema Echo with normal ejection fraction patient is bedbound at home prior to admission. Likely due to third spacing from hypoalbuminemia. Albumin 1.4  Consulteddietary  #8 constipationresolved now patient has diarrhea. DC stool softeners and laxatives.   #9 goals of care discussed with patient and daughter patient is DNR/DNI.  #10 left foot pain with swelling check x-ray of the left foot  Nutrition Problem: Increased nutrient needs Etiology: wound healing     Signs/Symptoms: estimated needs    Interventions: Prostat, MVI  Estimated body mass index is 32.14 kg/m as calculated from the following:   Height as of this encounter: 5\' 2"  (1.575 m).   Weight as of this  encounter: 79.7 kg.  Code Status:DNR/DNI Family Communication:dw daughter 5/8 Disposition Plan: Status is:  Inpatient, patient is stable for discharge however she has unsafe discharge plan. She lives on the third floor of the apartment and does not have an elevator. She will not be able to walk up the stairs to get to her apartment neither would she be able to leave the apartment in case of an emergency. PT has seen her and recommended SNF. She does not have any bed offers. Discussed with case Freight forwarder. Dispo: The patient is from: Home Anticipated d/c is to:snf/case manager looking into having the apartment complex give her an apartment on the ground level so she does not have to climb steps. Anticipated d/c date UE:[id number] Patient currentlyis medically stable to d/c  Consultants Orthopedics Urology  Procedures Echocardiogram Right hip Girdlestone procedure by orthopedics on 03/05/2020 Right leg splint 03/05/2020 Foley catheter placement by urology 03/08/2020  ANTIMICROBIALS- Anti-infectives (From admission, onward)   Start     Dose/Rate Route Frequency Ordered Stop   03/09/20 1030  ciprofloxacin (CIPRO) tablet 250 mg  Status:  Discontinued     250 mg Oral Daily with breakfast 03/09/20 1020 03/09/20 1200   03/08/20 2000  ciprofloxacin (CIPRO) tablet 250 mg  Status:  Discontinued     250 mg Oral 2 times daily 03/08/20 1753 03/09/20 1020   03/05/20 2100  ceFAZolin (ANCEF) IVPB 2g/100 mL premix     2 g 200 mL/hr over 30 Minutes Intravenous Every 8 hours 03/05/20 2043 03/06/20 0547   03/05/20 0818  vancomycin (VANCOCIN) powder  Status:  Discontinued       As needed 03/05/20 0819 03/05/20 0913   03/05/20 0600  ceFAZolin (ANCEF) IVPB 2g/100 mL premix  Status:  Discontinued     2 g 200 mL/hr over 30 Minutes Intravenous On call to O.R. 03/04/20 1713 03/05/20 1055        Subjective:  Resting in bed complains of lower extremity pain Objective: Vitals:   03/19/20 1215 03/19/20 1955 03/20/20 0555 03/20/20 1337  BP: 121/67 (!) 106/48 (!) 101/55  (!) 114/51  Pulse: 76 72 76 71  Resp: 17 14 14 15   Temp: 98.8 F (37.1 C) 98.5 F (36.9 C) 97.6 F (36.4 C) 97.8 F (36.6 C)  TempSrc: Oral Oral Oral Oral  SpO2: 96% 96% 97% 100%  Weight:      Height:        Intake/Output Summary (Last 24 hours) at 03/20/2020 1539 Last data filed at 03/20/2020 1500 Gross per 24 hour  Intake 120 ml  Output 1000 ml  Net -880 ml   Filed Weights   03/03/20 0421 03/03/20 0838  Weight: 79.7 kg 79.7 kg    Examination:  General exam: Appears calm and comfortable  Respiratory system: Clear to auscultation. Respiratory effort normal. Cardiovascular system: S1 & S2 heard, RRR. No JVD, murmurs, rubs, gallops or clicks. No pedal edema. Gastrointestinal system: Abdomen is nondistended, soft and nontender. No organomegaly or masses felt. Normal bowel sounds heard. Central nervous system: Alert and oriented. No focal neurological deficits. Extremities: Pitting edema 1+ all the way up to the thighs Skin: No rashes, lesions or ulcers Psychiatry: Judgement and insight appear normal. Mood & affect appropriate.     Data Reviewed: I have personally reviewed following labs and imaging studies  CBC: Recent Labs  Lab 03/14/20 0640 03/15/20 0553 03/18/20 1110  WBC  --  8.1 8.7  HGB 9.7* 10.3* 10.0*  HCT 30.8* 33.3* 33.0*  MCV  --  100.3* 100.0  PLT  --  181 614   Basic Metabolic Panel: Recent Labs  Lab 03/14/20 0640 03/15/20 0553 03/18/20 1110 03/19/20 0508  NA 140 140 142  --   K 4.5 5.1 4.5  --   CL 114* 110 113*  --   CO2 23 21* 23  --   GLUCOSE 61* 126* 135*  --   BUN 33* 31* 27*  --   CREATININE 1.19* 1.29* 1.35* 1.47*  CALCIUM 7.6* 7.6* 7.6*  --    GFR: Estimated Creatinine Clearance: 28.8 mL/min (A) (by C-G formula based on SCr of 1.47 mg/dL (H)). Liver Function Tests: Recent Labs  Lab 03/18/20 1110  AST 29  ALT 13  ALKPHOS 252*  BILITOT 1.0  PROT 5.3*  ALBUMIN 1.6*   Recent Labs  Lab 03/18/20 1110  LIPASE 14   No  results for input(s): AMMONIA in the last 168 hours. Coagulation Profile: No results for input(s): INR, PROTIME in the last 168 hours. Cardiac Enzymes: No results for input(s): CKTOTAL, CKMB, CKMBINDEX, TROPONINI in the last 168 hours. BNP (last 3 results) No results for input(s): PROBNP in the last 8760 hours. HbA1C: No results for input(s): HGBA1C in the last 72 hours. CBG: Recent Labs  Lab 03/19/20 1129 03/19/20 1707 03/19/20 2040 03/20/20 0727 03/20/20 1145  GLUCAP 95 144* 145* 157* 131*   Lipid Profile: No results for input(s): CHOL, HDL, LDLCALC, TRIG, CHOLHDL, LDLDIRECT in the last 72 hours. Thyroid Function Tests: No results for input(s): TSH, T4TOTAL, FREET4, T3FREE, THYROIDAB in the last 72 hours. Anemia Panel: No results for input(s): VITAMINB12, FOLATE, FERRITIN, TIBC, IRON, RETICCTPCT in the last 72 hours. Sepsis Labs: No results for input(s): PROCALCITON, LATICACIDVEN in the last 168 hours.  No results found for this or any previous visit (from the past 240 hour(s)).       Radiology Studies: DG Ankle Left Port  Result Date: 03/19/2020 CLINICAL DATA:  Left ankle pain. EXAM: PORTABLE LEFT ANKLE - 2 VIEW COMPARISON:  None. FINDINGS: Severe osteopenia is noted. Mildly displaced oblique fractures are seen involving the distal tibia and fibular diaphyses. Fractures remain in near anatomic alignment. Lucency at the site of the distal fibular fracture raises possibility of a pathologic fracture. No other focal bone lesions identified. IMPRESSION: 1. Mildly displaced oblique fractures of the distal tibia and fibula. 2. Lucency at site of fibular fracture, which raises suspicion for pathologic fracture. 3. Severe osteopenia. Electronically Signed   By: Marlaine Hind M.D.   On: 03/19/2020 12:28   DG Foot 2 Views Left  Result Date: 03/19/2020 CLINICAL DATA:  Left ankle and foot pain. EXAM: LEFT FOOT - 2 VIEW COMPARISON:  None. FINDINGS: Severe osteopenia. Comminuted impacted  fractures of the distal tibia and fibula. No fractures of the foot are seen. Please note that subtle/minimally displaced fractures are easily overlooked in the setting cyst severe osteopenia. Therefore, if focal tenderness is present within the left foot, evaluation with CT may be considered. Diffuse soft tissue swelling. IMPRESSION: 1. Comminuted impacted fractures of the left distal tibia and fibula. 2. No fractures of the foot. Please note that subtle/minimally displaced fractures are easily overlooked in the setting of severe osteopenia. Therefore, if focal tenderness is present within the left foot, evaluation with CT may be considered. Electronically Signed   By: Fidela Salisbury M.D.   On: 03/19/2020 12:30        Scheduled Meds: . calcitonin (salmon)  1 spray Alternating Nares Daily  . Chlorhexidine  Gluconate Cloth  6 each Topical Daily  . insulin aspart  0-5 Units Subcutaneous QHS  . insulin aspart  0-9 Units Subcutaneous TID WC  . insulin glargine  5 Units Subcutaneous QHS  . LORazepam  1 mg Intravenous Once  . multivitamin with minerals  1 tablet Oral Daily  . sodium chloride flush  3 mL Intravenous Q12H  . tamsulosin  0.4 mg Oral Daily   Continuous Infusions: . sodium chloride    . methocarbamol (ROBAXIN) IV       LOS: 17 days     Georgette Shell, MD 03/20/2020, 3:39 PM

## 2020-03-20 NOTE — Progress Notes (Signed)
   03/20/20 1555  Clinical Encounter Type  Visited With Family (daughter)  Visit Type Initial;Other (Comment);Spiritual support (Community support)  Referral From Palliative care team  Consult/Referral To Chaplain  Stress Factors  Family Stress Factors Financial concerns;Major life changes  The chaplain responded to PMT-[facility] referral for family spiritual care.  The chaplain introduced herself to the Pt. Daughter-Whitney [name].  The chaplain understands the Pt. is non [place] speaking.  The daughter accepted the chaplain's invitation to talk in the waiting room outside [facility].  The chaplain understands the Pt. and daughter have no family in the [place].  The Pt. daughter wants to take the Pt. to her home in [place].  Whitney [name] expressed her landlord is making it financially impossible to transition from a third to first floor apartment.  The chaplain confirmed the Pt. faith is Muslim. The chaplain will reach out to a Imam in [place] for community resources.  The chaplain is available for F/U spiritual care as needed.

## 2020-03-20 NOTE — Progress Notes (Signed)
Physical Therapy Treatment Patient Details Name: Whitney [name] MRN: [id number] DOB: Jan 04, 1935 Today's Date: 03/20/2020    History of Present Illness Pt is an 85 y.o. female who sustained R hip fx >1 month prior in [place], was seen in [place] [facility] and treated with pain meds and Lovenox before flying to [place], was briefly admitted to [place] seen by orthopedic sx, pt has since come to [facility] using oxycodone and gabapentin, but had increasing R hip pain, ultimately admitted to [facility] 03/02/20 and s/p R hip Girdlestone procedure on 4/24; during sx, pt sustained a spiral fx of R distal tibia. Pt with c/o pain in L ankle, xray shows mildly displaced oblique fractures of the distal tibia and fibula now NWB.  PMH: DM, CKD III.    PT Comments    Pt supine in bed on arrival.  [employer] IPAD interpreter used this session : (Atour # [id number]).  Pt initially agreeable to PT session but as treatment progressed she would become resistive then reports she wasn't resisting.  All movement performed within patient tolerance.  She refused to move to chair once up in lift so moved back to bed and positioned in a modified chair position with propping of pillows to improve alignment of her hips.  R hip is IR and L hip is ER.  Wedge placed in between knees and under L knee.  Continues to recommend SNF placement as she is unable to tolerate aggressive PT at this time.     Follow Up Recommendations  SNF;Supervision for mobility/OOB     Equipment Recommendations  Wheelchair (measurements PT);Wheelchair cushion (measurements PT);Hospital bed;Other (comment)(hoyer lift)    Recommendations for Other Services       Precautions / Restrictions Precautions Precautions: Fall;Other (comment) Precaution Comments: Bowel incontinence Required Braces or Orthoses: Splint/Cast Splint/Cast: R lower leg Restrictions Weight Bearing Restrictions: Yes RLE Weight Bearing: Non weight bearing LLE Weight Bearing: Non weight  bearing    Mobility  Bed Mobility Overal bed mobility: Needs Assistance Bed Mobility: Rolling Rolling: Mod assist;+2 for physical assistance         General bed mobility comments: modA+2 to roll right to left to position maximove pad under pt. Pt able to follow command for hand placement.  Transfers Overall transfer level: Needs assistance               General transfer comment: attempted to try maximove lift this session, pt giving mixed answers of agreeing to therapy then requesting to return to bed;pt required assistance from therapist to support BLE as she could not tolerate knee flexion.  Pt was lifted and moved away from bed and then brought back to bed at her request.  Once in bed,  attempted modified chair position and positioning of B LE.  Ambulation/Gait             General Gait Details: unable to attempt   Stairs             Wheelchair Mobility    Modified Rankin (Stroke Patients Only)       Balance       Sitting balance - Comments: pt declined                                    Cognition Arousal/Alertness: Awake/alert Behavior During Therapy: Anxious Overall Cognitive Status: Difficult to assess  General Comments: pt reporting differing views of participation with therapy, she was initially hesitant to to engage with therapy but then stated "do what you have to do" shortly into the transfer she then said "I told you no", use of interpreter to see if pt wants to continue therapy services, pt stated "yes"      Exercises Other Exercises Other Exercises: Educated pt on benefit of sitting in supported chair position in bed.  Other Exercises: Educated pt on benefit of participating with therapy, pt agreeable to attempt transfer to chair next session with use of maximove.  Other Exercises: educated pt on proper positioning of BLE.    General Comments General comments (skin  integrity, edema, etc.): teleinterpreter used this session      Pertinent Vitals/Pain Pain Assessment: Faces Faces Pain Scale: Hurts even more Pain Location: BLE with movement/repositioning Pain Descriptors / Indicators: Operative site guarding;Discomfort;Grimacing;Guarding Pain Intervention(s): Limited activity within patient's tolerance;Monitored during session;Repositioned    Home Living                      Prior Function            PT Goals (current goals can now be found in the care plan section) Acute Rehab PT Goals Patient Stated Goal: To get back in the bed. PT Goal Formulation: With family Potential to Achieve Goals: Poor Progress towards PT goals: Progressing toward goals    Frequency    Min 3X/week      PT Plan Current plan remains appropriate    Co-evaluation   Reason for Co-Treatment: Complexity of the patient's impairments (multi-system involvement);For patient/therapist safety;To address functional/ADL transfers   OT goals addressed during session: ADL's and self-care      AM-PAC PT "6 Clicks" Mobility   Outcome Measure  Help needed turning from your back to your side while in a flat bed without using bedrails?: Total Help needed moving from lying on your back to sitting on the side of a flat bed without using bedrails?: Total Help needed moving to and from a bed to a chair (including a wheelchair)?: Total Help needed standing up from a chair using your arms (e.g., wheelchair or bedside chair)?: Total Help needed to walk in hospital room?: Total Help needed climbing 3-5 steps with a railing? : Total 6 Click Score: 6    End of Session Equipment Utilized During Treatment: (maxi move lift pad.) Activity Tolerance: Patient tolerated treatment well;Patient limited by pain Patient left: in bed;with call bell/phone within reach;with family/visitor present Nurse Communication: Mobility status(informed nurse of modified chair position and  fluctuating compliance with PT.) PT Visit Diagnosis: Other abnormalities of gait and mobility (R26.89);Muscle weakness (generalized) (M62.81);Pain Pain - Right/Left: Right     Time: 5176-1607 PT Time Calculation (min) (ACUTE ONLY): 44 min  Charges:  $Therapeutic Activity: 8-22 mins                     Erasmo Leventhal , PTA Acute Rehabilitation Services Pager [phone] Office [phone]     Seydou Hearns Eli Hose 03/20/2020, 5:16 PM

## 2020-03-20 NOTE — Progress Notes (Addendum)
Occupational Therapy Treatment Patient Details Name: Whitney [name] MRN: [id number] DOB: Jun 15, 1935 Today's Date: 03/20/2020    History of present illness Pt is an 85 y.o. female who sustained R hip fx >1 month prior in [place], was seen in [place] [facility] and treated with pain meds and Lovenox before flying to [place], was briefly admitted to [place] seen by orthopedic sx, pt has since come to [facility] using oxycodone and gabapentin, but had increasing R hip pain, ultimately admitted to [facility] 03/02/20 and s/p R hip Girdlestone procedure on 4/24; during sx, pt sustained a spiral fx of R distal tibia. PMH includes DM, CKD III.   OT comments  Pt supine in bed with blanket over her head upon arrival. Use of [employer] interpreter Atour [phone] throughout the session. Pt initially agreeable to transfer to recliner, she required modA+2 to position maximove pad, halfway through the transfer pt requested to return to bed. Pt with continued talk about it being "her time" and frequently discussing death. Pt educated about her rights to participate or not participate in therapy, pt reported she did not want to engage this date but will re-attempt next time. Pt will continue to benefit from skilled OT services to maximize safety and independence with ADL/IADL and functional mobility. Will continue to follow acutely and progress as tolerated.    Follow Up Recommendations  SNF;Supervision/Assistance - 24 hour    Equipment Recommendations  Hospital bed    Recommendations for Other Services      Precautions / Restrictions Precautions Precautions: Fall;Other (comment) Precaution Comments: Bowel incontinence Required Braces or Orthoses: Splint/Cast Splint/Cast: R lower leg Restrictions Weight Bearing Restrictions: Yes RLE Weight Bearing: Non weight bearing LLE Weight Bearing: Non weight bearing       Mobility Bed Mobility Overal bed mobility: Needs Assistance Bed Mobility: Rolling;Sit to Supine;Supine  to Sit Rolling: Mod assist;+2 for physical assistance         General bed mobility comments: modA+2 to roll right to left to position maximove pad under pt   Transfers Overall transfer level: Needs assistance               General transfer comment: attempted to try maximove lift this session, pt giving mixed answers of agreeing to therapy then requesting to return to bed;pt required assistance from therapist to support BLE as she could not tolerate knee flexion     Balance       Sitting balance - Comments: pt declined                                   ADL either performed or assessed with clinical judgement   ADL Overall ADL's : Needs assistance/impaired     Grooming: Maximal assistance;Sitting               Lower Body Dressing: Total assistance   Toilet Transfer: +2 for physical assistance;Total assistance Toilet Transfer Details (indicate cue type and reason): rolling lt and rt to position sling Toileting- Clothing Manipulation and Hygiene: Total assistance       Functional mobility during ADLs: Maximal assistance;+2 for physical assistance;+2 for safety/equipment General ADL Comments: pt limited by pain     Vision       Perception     Praxis      Cognition Arousal/Alertness: Awake/alert Behavior During Therapy: Anxious Overall Cognitive Status: Difficult to assess  General Comments: pt reporting differing views of participation with therapy, she was initially hesitant to to engage with therapy but then stated "do what you have to do" shortly into the transfer she then said "I told you no", use of interpreter to see if pt wants to continue therapy services, pt stated "yes"        Exercises Exercises: Other exercises Other Exercises Other Exercises: Educated pt on benefit of sitting in supported chair position in bed.  Other Exercises: Educated pt on benefit of participating with  therapy, pt agreeable to attempt transfer to chair next session with use of maximove.  Other Exercises: educated pt on proper positioning of BLE.   Shoulder Instructions       General Comments teleinterpreter used this session;    Pertinent Vitals/ Pain       Pain Assessment: Faces Faces Pain Scale: Hurts even more Pain Location: BLE with movement/repositioning Pain Descriptors / Indicators: Operative site guarding;Discomfort;Grimacing;Guarding Pain Intervention(s): Limited activity within patient's tolerance;Monitored during session;Repositioned  Home Living                                          Prior Functioning/Environment              Frequency  Min 2X/week        Progress Toward Goals  OT Goals(current goals can now be found in the care plan section)  Progress towards OT goals: Not progressing toward goals - comment(continues to be limited with mobility due to pain and fear)  Acute Rehab OT Goals Patient Stated Goal: pt did not state OT Goal Formulation: With patient Time For Goal Achievement: 03/20/20 Potential to Achieve Goals: Fair ADL Goals Pt Will Perform Eating: with set-up;bed level;sitting Pt Will Perform Grooming: with min assist;sitting;bed level Pt Will Perform Upper Body Bathing: with set-up;with supervision;sitting;bed level Pt Will Transfer to Toilet: with +2 assist;bedside commode;with transfer board;with mod assist Pt/caregiver will Perform Home Exercise Program: Increased strength;Right Upper extremity;Left upper extremity;With Supervision;With theraband Additional ADL Goal #1: Pt will maintain EOB sitting with min guard assist x 7 mins in prep for functional transfers  Plan Discharge plan remains appropriate    Co-evaluation    PT/OT/SLP Co-Evaluation/Treatment: Yes Reason for Co-Treatment: Complexity of the patient's impairments (multi-system involvement);For patient/therapist safety;To address functional/ADL  transfers   OT goals addressed during session: ADL's and self-care      AM-PAC OT "6 Clicks" Daily Activity     Outcome Measure   Help from another person eating meals?: A Little Help from another person taking care of personal grooming?: A Lot Help from another person toileting, which includes using toliet, bedpan, or urinal?: A Lot Help from another person bathing (including washing, rinsing, drying)?: A Lot Help from another person to put on and taking off regular upper body clothing?: A Lot Help from another person to put on and taking off regular lower body clothing?: A Lot 6 Click Score: 13    End of Session Equipment Utilized During Treatment: Other (comment)(maximove)  OT Visit Diagnosis: Pain;Muscle weakness (generalized) (M62.81) Pain - Right/Left: Right Pain - part of body: Hip;Leg   Activity Tolerance Patient limited by pain;Other (comment)(pt limited by fear)   Patient Left in bed;with call bell/phone within reach;with bed alarm set   Nurse Communication Mobility status        Time: 9937-1696 OT Time Calculation (min): 49 min  Charges: OT General Charges $OT Visit: 1 Visit OT Treatments $Self Care/Home Management : 23-37 mins  Helene Kelp OTR/L Acute Rehabilitation Services Office: [place] 03/20/2020, 3:52 PM

## 2020-03-20 NOTE — Plan of Care (Signed)

## 2020-03-20 NOTE — Progress Notes (Signed)
Patient's daughter called and requested patient have her teeth brushed and be offered coffee.  Patient refused to have her teeth brushed this morning and shook her head no, when we brought her coffee.

## 2020-03-21 LAB — GLUCOSE, CAPILLARY
Glucose-Capillary: 172 mg/dL — ABNORMAL HIGH (ref 70–99)
Glucose-Capillary: 148 mg/dL — ABNORMAL HIGH (ref 70–99)
Glucose-Capillary: 116 mg/dL — ABNORMAL HIGH (ref 70–99)
Glucose-Capillary: 150 mg/dL — ABNORMAL HIGH (ref 70–99)

## 2020-03-21 LAB — VITAMIN D 25 HYDROXY (VIT D DEFICIENCY, FRACTURES): Vit D, 25-Hydroxy: 18.1 ng/mL — ABNORMAL LOW (ref 30–100)

## 2020-03-21 NOTE — Progress Notes (Signed)
PROGRESS NOTE    Analyssa [name]  YIF:[id number] DOB: 1934-11-14 DOA: 03/02/2020 PCP: Patient, No Pcp Per    Brief Narrative:HPI on 03/02/2020 by Dr. Verlin Grills a 85 y.o.femalewith medical history significant forinsulin-dependent diabetes mellitus, chronic kidney disease stage III, and right hip fracture more than a month ago, now presenting to the emergency department with increasing right hip pain despite taking oxycodone and gabapentin at home. Patient is accompanied by her daughter who assists with the history. Patient previously lived in [place] but had since been in [place] where she suffered a right hip fracture more than a month ago. She was seen in a hospital in [place] where she was treated with some pain medications and Lovenox for 4 flying to the [place]. She was briefly admitted to a hospital in [place] several days ago where she was seen by orthopedic surgery and discharged home with plans for outpatient follow-up. She has since come to [place], continues to use oxycodone and gabapentin, but has been experiencing increasing pain in the right hip. She denied any fevers, chills, chest pain, cough, or shortness of breath  03/21/2020 brief summary of last 7 days-patient remains bedridden now with a new left fibula and tibia fracture.  Ortho aware.  Planning to treat her nonsurgically.  Patient is nonweightbearing to her right lower extremity with fracture.  She has been nonambulatory for over 8 years. Discussed with daughter face-to-face updated the plan.  Assessment & Plan:   Principal Problem:   Intractable pain Active Problems:   Diabetes mellitus without complication (HCC)   CKD (chronic kidney disease), stage III   Closed right hip fracture (HCC)   Protein calorie malnutrition (HCC)   Bilateral leg edema   Closed right hip fracture, initial encounter ([place])   #1 right hip fracture a month ago status post right hip Girdlestone procedure  on 03/04/2020. Follow-up with Dr.xuin 2 weeks. Seen by physical therapy recommending SNF. Also recommending continuation of splint and elevation of the right lower extremity and nonweightbearing to the right lower extremity. 5/9- Mildly displaced oblique fractures of the distal tibia and fibula.  Lucency at the site of fibular fracture raises suspicion for pathological fracture.  Severe osteopenia.  Ortho reconsulted recommends nonsurgical management as patient is nonweightbearing and non ambulatory and severe osteoporosis  #2 acute urinary retention Foley catheter placed by urology. Follow-up with urology for outpatient cystoscopy. Continue Flomax.  #3 acute metabolic encephalopathy-resolved.  Normal ammonia normal B12 and TSH. CT of the head shows chronic small vessel disease without any acute abnormality. Gabapentin was stopped. Continue to follow her closely. Previous physician discussed with Dr. Lorraine Lax who recommended MRI and possibly doing an EEG. Her daughter wanted to hold off on that at this time.  #4 hyperkalemia she received calcium gluconate insulin dextrose Lokelma and Kayexalate. Potassium5.1. Follow-up labs pending#5 CKD stage IIIb at baselinecreatinine 1.29  #6 type 2 diabetes continue Lantus5 units nightlyand SSI  #7 chronic bilateral lower extremity edema Echo with normal ejection fraction patient is bedbound at home prior to admission. Likely due to third spacing from hypoalbuminemia. Albumin 1.4  Consulteddietary  #8 constipationresolved now patient has diarrhea. DC stool softeners and laxatives.   #9 goals of care discussed with patient and daughter patient is DNR/DNI.  Nutrition Problem: Increased nutrient needs Etiology: wound healing     Signs/Symptoms: estimated needs    Interventions: Prostat, MVI  Estimated body mass index is 32.14 kg/m as calculated from the following:   Height as  of this encounter: 5\' 2"  (1.575 m).    Weight as of this encounter: 79.7 kg.  Code Status:DNR/DNI Family Communication:dw daughter 5/8 Disposition Plan: Status is: Inpatient, patient is stable for discharge however she has unsafe discharge plan. She lives on the third floor of the apartment and does not have an elevator. She will not be able to walk up the stairs to get to her apartment neither would she be able to leave the apartment in case of an emergency. PT has seen her and recommended SNF. She does not have any bed offers. Discussed with case Freight forwarder. Dispo: The patient is from: Home Anticipated d/c is to:snf/case manager looking into having the apartment complex give her an apartment on the ground level so she does not have to climb steps. Anticipated d/c date XN:ATFTDDU Patient currentlyis medically stable to d/c  Consultants Orthopedics Urology  Procedures Echocardiogram Right hip Girdlestone procedure by orthopedics on 03/05/2020 Right leg splint 03/05/2020 Foley catheter placement by urology 03/08/2020  Antimicrobials- Anti-infectives (From admission, onward)   Start     Dose/Rate Route Frequency Ordered Stop   03/09/20 1030  ciprofloxacin (CIPRO) tablet 250 mg  Status:  Discontinued     250 mg Oral Daily with breakfast 03/09/20 1020 03/09/20 1200   03/08/20 2000  ciprofloxacin (CIPRO) tablet 250 mg  Status:  Discontinued     250 mg Oral 2 times daily 03/08/20 1753 03/09/20 1020   03/05/20 2100  ceFAZolin (ANCEF) IVPB 2g/100 mL premix     2 g 200 mL/hr over 30 Minutes Intravenous Every 8 hours 03/05/20 2043 03/06/20 0547   03/05/20 0818  vancomycin (VANCOCIN) powder  Status:  Discontinued       As needed 03/05/20 0819 03/05/20 0913   03/05/20 0600  ceFAZolin (ANCEF) IVPB 2g/100 mL premix  Status:  Discontinued     2 g 200 mL/hr over 30 Minutes Intravenous On call to O.R. 03/04/20 1713 03/05/20 1055       Subjective: Patient resting in bed no  complaints  Objective: Vitals:   03/20/20 2020 03/21/20 0302 03/21/20 0832 03/21/20 1441  BP: (!) 117/42 113/60 (!) 130/43 (!) 114/46  Pulse: 93 81 82 73  Resp: 17 17 15 18   Temp: 97.9 F (36.6 C) 97.9 F (36.6 C) 98 F (36.7 C) 98.3 F (36.8 C)  TempSrc: Oral Oral Oral Oral  SpO2: 94% 94% 98% 95%  Weight:      Height:        Intake/Output Summary (Last 24 hours) at 03/21/2020 1614 Last data filed at 03/21/2020 1446 Gross per 24 hour  Intake 120 ml  Output 450 ml  Net -330 ml   Filed Weights   03/03/20 0421 03/03/20 0838  Weight: 79.7 kg 79.7 kg    Examination:  General exam: Appears calm and comfortable  Respiratory system: Clear to auscultation. Respiratory effort normal. Cardiovascular system: S1 & S2 heard, RRR. No JVD, murmurs, rubs, gallops or clicks. No pedal edema. Gastrointestinal system: Abdomen is nondistended, soft and nontender. No organomegaly or masses felt. Normal bowel sounds heard. Central nervous system: Alert and oriented. No focal neurological deficits. Extremities: 1+ edema all the way up to the thighs, soft brace in place both lower extremities. Skin: No rashes, lesions or ulcers Psychiatry: Judgement and insight appear normal. Mood & affect appropriate.     Data Reviewed: I have personally reviewed following labs and imaging studies  CBC: Recent Labs  Lab 03/15/20 0553 03/18/20 1110  WBC 8.1 8.7  HGB  10.3* 10.0*  HCT 33.3* 33.0*  MCV 100.3* 100.0  PLT 181 672   Basic Metabolic Panel: Recent Labs  Lab 03/15/20 0553 03/18/20 1110 03/19/20 0508  NA 140 142  --   K 5.1 4.5  --   CL 110 113*  --   CO2 21* 23  --   GLUCOSE 126* 135*  --   BUN 31* 27*  --   CREATININE 1.29* 1.35* 1.47*  CALCIUM 7.6* 7.6*  --    GFR: Estimated Creatinine Clearance: 28.8 mL/min (A) (by C-G formula based on SCr of 1.47 mg/dL (H)). Liver Function Tests: Recent Labs  Lab 03/18/20 1110  AST 29  ALT 13  ALKPHOS 252*  BILITOT 1.0  PROT 5.3*   ALBUMIN 1.6*   Recent Labs  Lab 03/18/20 1110  LIPASE 14   No results for input(s): AMMONIA in the last 168 hours. Coagulation Profile: No results for input(s): INR, PROTIME in the last 168 hours. Cardiac Enzymes: No results for input(s): CKTOTAL, CKMB, CKMBINDEX, TROPONINI in the last 168 hours. BNP (last 3 results) No results for input(s): PROBNP in the last 8760 hours. HbA1C: No results for input(s): HGBA1C in the last 72 hours. CBG: Recent Labs  Lab 03/20/20 1145 03/20/20 1633 03/20/20 2021 03/21/20 0736 03/21/20 1149  GLUCAP 131* 135* 169* 172* 148*   Lipid Profile: No results for input(s): CHOL, HDL, LDLCALC, TRIG, CHOLHDL, LDLDIRECT in the last 72 hours. Thyroid Function Tests: No results for input(s): TSH, T4TOTAL, FREET4, T3FREE, THYROIDAB in the last 72 hours. Anemia Panel: No results for input(s): VITAMINB12, FOLATE, FERRITIN, TIBC, IRON, RETICCTPCT in the last 72 hours. Sepsis Labs: No results for input(s): PROCALCITON, LATICACIDVEN in the last 168 hours.  No results found for this or any previous visit (from the past 240 hour(s)).       Radiology Studies: No results found.      Scheduled Meds: . calcitonin (salmon)  1 spray Alternating Nares Daily  . Chlorhexidine Gluconate Cloth  6 each Topical Daily  . insulin aspart  0-5 Units Subcutaneous QHS  . insulin aspart  0-9 Units Subcutaneous TID WC  . insulin glargine  5 Units Subcutaneous QHS  . LORazepam  1 mg Intravenous Once  . multivitamin with minerals  1 tablet Oral Daily  . sodium chloride flush  3 mL Intravenous Q12H  . tamsulosin  0.4 mg Oral Daily   Continuous Infusions: . sodium chloride    . methocarbamol (ROBAXIN) IV       LOS: 18 days     Georgette Shell, MD 03/21/2020, 4:14 PM

## 2020-03-21 NOTE — Progress Notes (Signed)
   03/21/20 1131  Clinical Encounter Type  Visited With Patient and family together  Visit Type Follow-up  Referral From Palliative care team  Consult/Referral To Chaplain  Stress Factors  Family Stress Factors Other (Comment) Librarian, academic)  This chaplain responded to PMT consult for spiritual care.  The chaplain communicated with Pt. RN-Erica before the visit.  The chaplain shared the contact information for [facility] [employer].Marland KitchenMarland KitchenDr Quintella Baton Issifou [phone] with Pt. daughter for connection to community resources and Imam. The chaplain is available for F/U spiritual care as needed.

## 2020-03-21 NOTE — Plan of Care (Signed)

## 2020-03-21 NOTE — TOC Transition Note (Addendum)
Transition of Care [facility]) - CM/SW Discharge Note   Patient Details  Name: Whitney [name] MRN: [id number] Date of Birth: 19-Sep-1935  Transition of Care [facility]) CM/SW Contact:  Curlene Labrum, RN Phone Number: 03/21/2020, 11:13 AM   Clinical Narrative:    Case management spoke to [facility], liaison with [place] [facility] [phone] complex to assist with providing the proper documentation to have the patient be moved to a lower level apartment on the first floor.  The apartment complex does not have an elevator.  I called the daughter this morning and explained that the patient will need to be discharged from the hospital by Friday, May 03/24/2020. Case management explained to the leasing office at the apartment that we would provide any medical documentation so that the patient's family would not be penalized for the move.  I left my number with the facility to assist with this process as necessary.  Will continue to follow and offer the daughter support as needed.  5/11 1630- I spoke with the [place] apartment complex to establish that the patient's daughter would be allowed to move to a lower level 1-bedroom apartment on the 1st floor for safety reason.  The apartment complex does not have an elevator.  The apartment complex manager stated that the patient's family would have a 1 bedroom apartment available to her by Sunday.  I explained to the daughter that the patient would be transferred by ambulance from the hospital room to the downstairs apartment by Friday 5/14 or Monday 5/17 once the hospital bed, WC, and hoyer lift were delivered by [place].  5/11- 1650 - [employer] called and spoke with Thedore Mins and confirmed that the patient will most probably go home between Friday and Monday - hospital bed, hoyer lift, WC with cushion were reordered as prior.  Also, called [facility] and spoke with Tiffany and PT, OT, MSW, and aide ordered as prior as well.  [facility] confirmed this and  would be able to see the patient not later than 5/17 Monday after patient is discharged home.  Final next level of care: Home w Home Health Services Barriers to Discharge: Family Issues   Patient Goals and CMS Choice Patient states their goals for this hospitalization and ongoing recovery are:: Patient lives with her daughter and was flown to the [place] to have surgery. CMS Medicare.gov Compare Post Acute Care list provided to:: Patient Represenative (must comment)([name], daughter) Choice offered to / list presented to : [place] / [place]  Discharge Placement      Discharge Plan and Services   Discharge Planning Services: CM Consult Post Acute Care Choice: Durable Medical Equipment, Home Health          DME Arranged: 3-N-1, Hospital bed, Other see comment, Trapeze(Hoyer lift) DME Agency: [employer] Date DME Agency Contacted: 03/06/20 Time DME Agency Contacted: [phone] Representative spoke with at DME Agency: [place]: PT, OT [place] Agency: [facility] (formerly [employer]) Date [place]: 03/06/20 Time [place]: 1548 Representative spoke with at [place]: [place] ([place]) Interventions     Readmission Risk Interventions No flowsheet data found.

## 2020-03-22 LAB — GLUCOSE, CAPILLARY
Glucose-Capillary: 115 mg/dL — ABNORMAL HIGH (ref 70–99)
Glucose-Capillary: 130 mg/dL — ABNORMAL HIGH (ref 70–99)
Glucose-Capillary: 145 mg/dL — ABNORMAL HIGH (ref 70–99)
Glucose-Capillary: 133 mg/dL — ABNORMAL HIGH (ref 70–99)

## 2020-03-22 MED ORDER — GLUCERNA SHAKE PO LIQD
237.0000 mL | Freq: Three times a day (TID) | ORAL | Status: DC
  Administered 2020-03-22 – 2020-03-30 (×10): 237 mL via ORAL

## 2020-03-22 NOTE — Progress Notes (Signed)
PROGRESS NOTE    Alisan Dokes  NTI:[id number] DOB: 06-22-1935 DOA: 03/02/2020 PCP: Patient, No Pcp Per    Brief Narrative:  85 year old female with insulin-dependent diabetes, chronic kidney disease stage IIIa, bedbound status for last 12 years as per patient, right hip fracture when she was in [place] about a month ago prior to arrival treated conservatively brought to the [place] by her daughter.  At the same time, patient probably also had right distal tibia and fibula fracture.  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 Remains in the hospital with no appropriate disposition. -Urinary retention status post Foley catheter placed by urology.  Continue Flomax.   Assessment & Plan:   Principal Problem:   Intractable pain Active Problems:   Diabetes mellitus without complication (HCC)   CKD (chronic kidney disease), stage III   Closed right hip fracture (HCC)   Protein calorie malnutrition (HCC)   Bilateral leg edema   Closed right hip fracture, initial encounter ([place])  Multiple spontaneous osteoporotic fractures in a patient with bedbound and advanced deconditioning: -Multiple fractures on conservative management.  Bed mobility.  PT OT, transfers.  Adequate pain medications.  Severe comorbidities and high mortality in near future.  Palliative care consultation and follow-up.  Adequate pain medications.  Patient will benefit with outpatient palliative care follow-up and subsequent hospice.  Called and discussed with palliative care medicine. On calcitonin for osteoporosis.  Acute urinary retention: Probably due to immobility and fractures.  Catheter was placed by urology due to urethral stricture.  Discharged  with catheter and outpatient follow-up.  Type 2 diabetes with CKD stage IIIa: Blood sugars fairly stable on insulin regimen.  Constipation: Resolved.  Patient now complains of abdominal cramping and diarrhea.  Discontinue all laxatives.   DVT prophylaxis: SCDs Code Status: DNR/DNI Family Communication: Patient's daughter at the bedside Disposition Plan: Status is: Inpatient  Remains inpatient appropriate because:Unsafe d/c plan   Dispo: The patient is from: Home              Anticipated d/c is to: Home              Anticipated d/c date is: > 3 days              Patient currently is medically stable to d/c.          Consultants:   Orthopedics  Procedures:   None  Antimicrobials:  Anti-infectives (From admission, onward)   Start     Dose/Rate Route Frequency Ordered Stop   03/09/20 1030  ciprofloxacin (CIPRO) tablet 250 mg  Status:  Discontinued     250 mg Oral Daily with breakfast 03/09/20 1020 03/09/20 1200   03/08/20 2000  ciprofloxacin (CIPRO) tablet 250 mg  Status:  Discontinued     250 mg Oral 2 times daily 03/08/20 1753 03/09/20 1020   03/05/20 2100  ceFAZolin (ANCEF) IVPB 2g/100 mL premix     2 g 200 mL/hr over 30 Minutes Intravenous Every 8 hours 03/05/20 2043 03/06/20 0547   03/05/20 0818  vancomycin (VANCOCIN) powder  Status:  Discontinued       As needed 03/05/20 0819 03/05/20 0913   03/05/20 0600  ceFAZolin (ANCEF) IVPB 2g/100 mL premix  Status:  Discontinued     2 g 200 mL/hr over 30  Minutes Intravenous On call to O.R. 03/04/20 1713 03/05/20 1055         Subjective: Patient seen and examined.  Later, went to examine patient along with patient's daughter at the bedside and Arabic interpreter. Physical therapist and occupational therapist were trying to convince patient to work with them for bed mobility and transfer, patient adamantly refused.  She was very adamant that she knows how to move around in the bed and she has been doing this for last 12  years.  She refused to participate in therapies because she thinks working with therapies may break her legs more. Patient did complain about abdominal cramping and diarrhea, not reported by nursing staff.  Will use some antacids.   Objective: Vitals:   03/21/20 1940 03/22/20 0347 03/22/20 0824 03/22/20 1436  BP: (!) 129/46 (!) 105/49 (!) 105/45 (!) 115/45  Pulse: 79 78 70 75  Resp: 17 16 18 18   Temp: 98.7 F (37.1 C) 98 F (36.7 C) 98.5 F (36.9 C) 98.3 F (36.8 C)  TempSrc: Oral Oral Oral Oral  SpO2: 96% 95% 92% 92%  Weight:      Height:        Intake/Output Summary (Last 24 hours) at 03/22/2020 1445 Last data filed at 03/22/2020 1437 Gross per 24 hour  Intake 243 ml  Output 675 ml  Net -432 ml   Filed Weights   03/03/20 0421 03/03/20 0838  Weight: 79.7 kg 79.7 kg    Examination:  General exam: Appears calm and comfortable, anxious and not happy when therapist approached her. Respiratory system: Clear to auscultation. Respiratory effort normal. Cardiovascular system: S1 & S2 heard, RRR.  Gastrointestinal system: Abdomen is nondistended, soft and nontender.  Umbilical hernia present.  No localized tenderness. Central nervous system: Alert and oriented. No focal neurological deficits. Both legs on posterior slab.     Data Reviewed: I have personally reviewed following labs and imaging studies  CBC: Recent Labs  Lab 03/18/20 1110  WBC 8.7  HGB 10.0*  HCT 33.0*  MCV 100.0  PLT 027   Basic Metabolic Panel: Recent Labs  Lab 03/18/20 1110 03/19/20 0508  NA 142  --   K 4.5  --   CL 113*  --   CO2 23  --   GLUCOSE 135*  --   BUN 27*  --   CREATININE 1.35* 1.47*  CALCIUM 7.6*  --    GFR: Estimated Creatinine Clearance: 28.8 mL/min (A) (by C-G formula based on SCr of 1.47 mg/dL (H)). Liver Function Tests: Recent Labs  Lab 03/18/20 1110  AST 29  ALT 13  ALKPHOS 252*  BILITOT 1.0  PROT 5.3*  ALBUMIN 1.6*   Recent Labs  Lab 03/18/20 1110  LIPASE  14   No results for input(s): AMMONIA in the last 168 hours. Coagulation Profile: No results for input(s): INR, PROTIME in the last 168 hours. Cardiac Enzymes: No results for input(s): CKTOTAL, CKMB, CKMBINDEX, TROPONINI in the last 168 hours. BNP (last 3 results) No results for input(s): PROBNP in the last 8760 hours. HbA1C: No results for input(s): HGBA1C in the last 72 hours. CBG: Recent Labs  Lab 03/21/20 1149 03/21/20 1707 03/21/20 1943 03/22/20 0822 03/22/20 1129  GLUCAP 148* 116* 150* 115* 130*   Lipid Profile: No results for input(s): CHOL, HDL, LDLCALC, TRIG, CHOLHDL, LDLDIRECT in the last 72 hours. Thyroid Function Tests: No results for input(s): TSH, T4TOTAL, FREET4, T3FREE, THYROIDAB in the last 72 hours. Anemia Panel: No results for input(s):  VITAMINB12, FOLATE, FERRITIN, TIBC, IRON, RETICCTPCT in the last 72 hours. Sepsis Labs: No results for input(s): PROCALCITON, LATICACIDVEN in the last 168 hours.  No results found for this or any previous visit (from the past 240 hour(s)).       Radiology Studies: No results found.      Scheduled Meds: . calcitonin (salmon)  1 spray Alternating Nares Daily  . Chlorhexidine Gluconate Cloth  6 each Topical Daily  . feeding supplement (GLUCERNA SHAKE)  237 mL Oral TID BM  . insulin aspart  0-5 Units Subcutaneous QHS  . insulin aspart  0-9 Units Subcutaneous TID WC  . insulin glargine  5 Units Subcutaneous QHS  . LORazepam  1 mg Intravenous Once  . multivitamin with minerals  1 tablet Oral Daily  . sodium chloride flush  3 mL Intravenous Q12H  . tamsulosin  0.4 mg Oral Daily   Continuous Infusions: . sodium chloride    . methocarbamol (ROBAXIN) IV       LOS: 19 days    Time spent: 25 minutes    Barb Merino, MD [facility] Pager [phone]

## 2020-03-22 NOTE — Progress Notes (Addendum)
Nutrition Follow-up  RD working remotely.  DOCUMENTATION CODES:   Obesity unspecified  INTERVENTION:   -Glucerna Shake po TID, each supplement provides 220 kcal and 10 grams of protein -Continue MVI with minerals daily -Magic cup TID with meals, each supplement provides 290 kcal and 9 grams of protein  NUTRITION DIAGNOSIS:   Increased nutrient needs related to wound healing as evidenced by estimated needs.  Ongoing  GOAL:   Patient will meet greater than or equal to 90% of their needs  Progressing   MONITOR:   PO intake, Supplement acceptance, Labs, Weight trends, Skin, I & O's  REASON FOR ASSESSMENT:   Consult Assessment of nutrition requirement/status  ASSESSMENT:   Whitney [name] is a 85 y.o. female with medical history significant for insulin-dependent diabetes mellitus, chronic kidney disease stage III, and right hip fracture more than a month ago, now presenting to the emergency department with increasing right hip pain despite taking oxycodone and gabapentin at home. Patient previously lived in [place] but had since been in [place] where she suffered a right hip fracture more than a month ago.  She was seen in a hospital in [place] where she was treated with some pain medications and Lovenox for 4 flying to the [place].  She was briefly admitted to a hospital in [place] several days ago where she was seen by orthopedic surgery and discharged home with plans for outpatient follow-up.  She has since come to [place], continues to use oxycodone and gabapentin, but has been experiencing increasing pain in the right hip.  She denied any fevers, chills, chest pain, cough, or shortness of breath.  Reviewed I/O's: -450 ml x 24 hours and -5.7 L since 03/08/20  Attempted to speak with pt via phone, however, no answer.   Per orthopedics notes, pt with left closed distal metaphyseoepiphyseal fracture,angulated minimally displace; plan to treat non-operatively with  a splint.   Intake has been variable; noted meal completion 10-60% (averaging around 25-50%).  Per [facility] team notes, pt is a difficult placement for SNF. Daughter plans to move to a first floor level apartment to accommodate pt's needs.   Albumin has a half-life of 21 days and is strongly affected by stress response and inflammatory process, therefore, do not expect to see an improvement in this lab value during acute hospitalization.When a patient presents with low albumin, it is likely skewed due to the acute inflammatory response.  Unless it is suspected that patient had poor PO intake or malnutrition prior to admission, then RD should not be consulted solely for low albumin. Note that low albumin is no longer used to diagnose malnutrition; [place] uses the new malnutrition guidelines published by the American Society for Parenteral and Enteral Nutrition (A.S.P.E.N.) and the Academy of Nutrition and Dietetics (AND).    Labs reviewed: CBGS: 116-172 (inpatient orders for glycemic control are 0-5 units insulin aspart q HS, 0-9 units insulin aspart TID with meals, and 5 units insulin glargine q HS).   Diet Order:   Diet Order            Diet Carb Modified Fluid consistency: Thin; Room service appropriate? No  Diet effective now              EDUCATION NEEDS:   No education needs have been identified at this time  Skin:  Skin Assessment: Skin Integrity Issues: Skin Integrity Issues:: Incisions Incisions: bilateral hips, rt leg Other: bullae present on BLE; partial thickness area to lt buttocks; hyperpignemtation  to sacrum  Last BM:  03/21/20  Height:   Ht Readings from Last 1 Encounters:  03/03/20 5\' 2"  (1.575 m)    Weight:   Wt Readings from Last 1 Encounters:  03/03/20 79.7 kg    Ideal Body Weight:  50 kg  BMI:  Body mass index is 32.14 kg/m.  Estimated Nutritional Needs:   Kcal:  1500-1700  Protein:  80-95 grams  Fluid:  > 1.5 L    Loistine Chance, RD, LDN,  [place] Registered Dietitian II Certified Diabetes Care and Education Specialist Please refer to [facility] for RD and/or RD on-call/weekend/after hours pager

## 2020-03-22 NOTE — Care Management (Signed)
    Durable Medical Equipment  (From admission, onward)         Start     Ordered   03/06/20 1648  For home use only DME lightweight manual wheelchair with seat cushion  Once    Comments: Patient suffers from S/P fracture of femoral head which impairs their ability to perform daily activities like LGSP:[id number] in the home.  A walking RVA:[id number] will not resolve  issue with performing activities of daily living. A wheelchair will allow patient to safely perform daily activities. Patient is not able to propel themselves in the home using a standard weight wheelchair due to weakness:20653. Patient can self propel in the lightweight wheelchair. Length of need Timeframe:22503. Accessories: elevating leg rests (ELRs), wheel locks, extensions and anti-tippers.   03/06/20 1648   03/06/20 1647  For home use only DME Hospital bed  Once    Question Answer Comment  Length of Need 6 Months   The above medical condition requires: Patient requires the ability to reposition frequently   Bed type Semi-electric   [facility] Lift Yes   Trapeze Bar Yes   Support Surface: Gel Overlay      03/06/20 1648   03/06/20 1646  For home use only DME 3 n 1  Once     03/06/20 1648

## 2020-03-22 NOTE — Progress Notes (Addendum)
Physical Therapy Treatment Patient Details Name: Whitney [name] MRN: [id number] DOB: Jun 06, 1935 Today's Date: 03/22/2020    History of Present Illness Pt is an 85 y.o. female who sustained R hip fx >1 month prior in [place], was seen in [place] hospital and treated with pain meds and Lovenox before flying to [place], was briefly admitted to [place] seen by orthopedic sx, pt has since come to [facility] using oxycodone and gabapentin, but had increasing R hip pain, ultimately admitted to [facility] 03/02/20 and s/p R hip Girdlestone procedure on 4/24; during sx, pt sustained a spiral fx of R distal tibia. Pt with c/o pain in L ankle, xray shows mildly displaced oblique fractures of the distal tibia and fibula now NWB.  PMH: DM, CKD III.    PT Comments    Pt adamantly refusing any type of therapy services including bed mobility, transfers, and exercises. Discussed treatment plan to practice and educate pt daughter on bed mobility I.e. rolling but pt states, "I will not let you or my daughter touch me." She reports she is worried working with therapy will result in more broken bones. Pt daughter reports no change in pt baseline cognition but stated, "I think she might be depressed." PT verbally educated daughter regarding pressure relief schedule, body mechanics with bed mobility/transfers, DME recommendations and use, sitting upright when eating to reduce aspiration risk. PT also discussed with CM the recommendation of adding an air mattress overlay for the hospital bed.   [employer] interpreter Earl Lagos [phone] utilized for this therapy session   Follow Up Recommendations  SNF;Supervision for mobility/OOB     Equipment Recommendations  Wheelchair (measurements PT);Wheelchair cushion (measurements PT);Hospital bed;Other (comment)(hoyer lift, air mattress overlay)    Recommendations for Other Services       Precautions / Restrictions Precautions Precautions: Fall;Other (comment) Precaution Comments:  Bowel incontinence Required Braces or Orthoses: Splint/Cast Splint/Cast: R lower leg Restrictions Weight Bearing Restrictions: Yes RLE Weight Bearing: Non weight bearing LLE Weight Bearing: Non weight bearing    Mobility  Bed Mobility               General bed mobility comments: refused by pt  Transfers                    Ambulation/Gait                 Stairs             Wheelchair Mobility    Modified Rankin (Stroke Patients Only)       Balance                                            Cognition Arousal/Alertness: Awake/alert Behavior During Therapy: Anxious Overall Cognitive Status: Difficult to assess                                 General Comments: Pt daughter reports no change in pt cognition but states she thinks she is depressed.       Exercises      General Comments        Pertinent Vitals/Pain Pain Assessment: Faces Faces Pain Scale: No hurt Pain Location: at rest    Home Living  Prior Function            PT Goals (current goals can now be found in the care plan section) Acute Rehab PT Goals Patient Stated Goal: to rest PT Goal Formulation: With patient/family Time For Goal Achievement: 04/05/20 Potential to Achieve Goals: Poor Progress towards PT goals: Not progressing toward goals - comment(pt refusing)    Frequency    Min 3X/week      PT Plan Current plan remains appropriate    Co-evaluation              AM-PAC PT "6 Clicks" Mobility   Outcome Measure  Help needed turning from your back to your side while in a flat bed without using bedrails?: Total Help needed moving from lying on your back to sitting on the side of a flat bed without using bedrails?: Total Help needed moving to and from a bed to a chair (including a wheelchair)?: Total Help needed standing up from a chair using your arms (e.g., wheelchair or bedside  chair)?: Total Help needed to walk in hospital room?: Total Help needed climbing 3-5 steps with a railing? : Total 6 Click Score: 6    End of Session   Activity Tolerance: Other (comment)(pt refusing) Patient left: in bed;with call bell/phone within reach;with family/visitor present   PT Visit Diagnosis: Other abnormalities of gait and mobility (R26.89);Muscle weakness (generalized) (M62.81);Pain Pain - Right/Left: Right Pain - part of body: Hip;Leg     Time: 1696-7893 PT Time Calculation (min) (ACUTE ONLY): 22 min  Charges:  $Self Care/Home Management: 8-22                       Wyona Almas, PT, DPT Acute Rehabilitation Services Pager [phone] Office [phone]    Deno Etienne 03/22/2020, 3:07 PM

## 2020-03-23 DIAGNOSIS — E860 Dehydration: Secondary | ICD-10-CM

## 2020-03-23 DIAGNOSIS — R52 Pain, unspecified: Secondary | ICD-10-CM

## 2020-03-23 DIAGNOSIS — Z515 Encounter for palliative care: Secondary | ICD-10-CM

## 2020-03-23 DIAGNOSIS — Z7189 Other specified counseling: Secondary | ICD-10-CM

## 2020-03-23 LAB — CBC WITH DIFFERENTIAL/PLATELET
Abs Immature Granulocytes: 0.01 10*3/uL (ref 0.00–0.07)
Basophils Absolute: 0 10*3/uL (ref 0.0–0.1)
Basophils Relative: 1 %
Eosinophils Absolute: 0.2 10*3/uL (ref 0.0–0.5)
Eosinophils Relative: 5 %
HCT: 33 % — ABNORMAL LOW (ref 36.0–46.0)
Hemoglobin: 10 g/dL — ABNORMAL LOW (ref 12.0–15.0)
Immature Granulocytes: 0 %
Lymphocytes Relative: 29 %
Lymphs Abs: 1.4 10*3/uL (ref 0.7–4.0)
MCH: 31.2 pg (ref 26.0–34.0)
MCHC: 30.3 g/dL (ref 30.0–36.0)
MCV: 102.8 fL — ABNORMAL HIGH (ref 80.0–100.0)
Monocytes Absolute: 0.7 10*3/uL (ref 0.1–1.0)
Monocytes Relative: 15 %
Neutro Abs: 2.4 10*3/uL (ref 1.7–7.7)
Neutrophils Relative %: 50 %
Platelets: 257 10*3/uL (ref 150–400)
RBC: 3.21 MIL/uL — ABNORMAL LOW (ref 3.87–5.11)
RDW: 17.1 % — ABNORMAL HIGH (ref 11.5–15.5)
WBC: 4.7 10*3/uL (ref 4.0–10.5)
nRBC: 0 % (ref 0.0–0.2)

## 2020-03-23 LAB — COMPREHENSIVE METABOLIC PANEL
ALT: 15 U/L (ref 0–44)
AST: 35 U/L (ref 15–41)
Albumin: 1.5 g/dL — ABNORMAL LOW (ref 3.5–5.0)
Alkaline Phosphatase: 281 U/L — ABNORMAL HIGH (ref 38–126)
Anion gap: 6 (ref 5–15)
BUN: 26 mg/dL — ABNORMAL HIGH (ref 8–23)
CO2: 23 mmol/L (ref 22–32)
Calcium: 7.3 mg/dL — ABNORMAL LOW (ref 8.9–10.3)
Chloride: 114 mmol/L — ABNORMAL HIGH (ref 98–111)
Creatinine, Ser: 1.34 mg/dL — ABNORMAL HIGH (ref 0.44–1.00)
GFR calc Af Amer: 43 mL/min — ABNORMAL LOW (ref >60)
GFR calc non Af Amer: 37 mL/min — ABNORMAL LOW (ref >60)
Glucose, Bld: 139 mg/dL — ABNORMAL HIGH (ref 70–99)
Potassium: 4.6 mmol/L (ref 3.5–5.1)
Sodium: 143 mmol/L (ref 135–145)
Total Bilirubin: 0.8 mg/dL (ref 0.3–1.2)
Total Protein: 5.3 g/dL — ABNORMAL LOW (ref 6.5–8.1)

## 2020-03-23 LAB — MAGNESIUM: Magnesium: 2 mg/dL (ref 1.7–2.4)

## 2020-03-23 LAB — PHOSPHORUS: Phosphorus: 3.6 mg/dL (ref 2.5–4.6)

## 2020-03-23 LAB — GLUCOSE, CAPILLARY
Glucose-Capillary: 107 mg/dL — ABNORMAL HIGH (ref 70–99)
Glucose-Capillary: 128 mg/dL — ABNORMAL HIGH (ref 70–99)
Glucose-Capillary: 109 mg/dL — ABNORMAL HIGH (ref 70–99)
Glucose-Capillary: 130 mg/dL — ABNORMAL HIGH (ref 70–99)

## 2020-03-23 NOTE — TOC Progression Note (Signed)
Transition of Care [facility]) - Progression Note    Patient Details  Name: Whitney [name] MRN: [id number] Date of Birth: [birth year]  [facility]) CM/SW Contact  Curlene Labrum, RN Phone Number: 03/23/2020, 2:16 PM  Clinical Narrative:    Case management met with the patient's daughter at the bedside to discuss transitions of care needs.  The patient's daughter is upset and frustrated at feeling "pressured" at discussing discharge needs and lack of support.  I explained to the patient's daughter that [facility] would be providing home health upon discharge for PT/OT/MSW and aide within 24-48 hours of discharge.  Patient's daughter also given choice upon admission regarding dme services - but patient's daughter is concerned and angry at the cost of the dme prior to discharge and having to use a credit card for payment of services for the equipment.  I explained to the daughter in depth about the dme and home health services being provided through her bright health insurance and that there would be a cost in association with private insurance.  The daughter has an awaiting appointment to establish Medicare and Medicaid and this will certainly offer financial assistance to the family.  I will continue to follow the patient and daughter and offer assistance and education regarding needs for discharge.    [place] [facility] declined acceptance of charity for the patient.  [facility] will be seeing the patient and costs of visits will be covered through the patient's Bright health insurance only at this point.   Expected Discharge Plan: [place] Barriers to Discharge: Family Issues  Expected Discharge Plan and Services Expected Discharge Plan: [place]   Discharge Planning Services: CM Consult Post Acute Care Choice: Durable Medical Equipment, Home Health Living arrangements for the past 2 months: Apartment(Patient was flown from  [place] to [place] to have surgery.)                 DME Arranged: 3-N-1, Hospital bed, Other see comment, Trapeze(Hoyer lift) DME Agency: [employer] Date DME Agency Contacted: 03/06/20 Time DME Agency Contacted: [phone] Representative spoke with at DME Agency: [place]: PT, OT [place] Agency: [facility] ([facility] [employer] Date [place]: 03/06/20 Time [place]: 1548 Representative spoke with at [place]: [place] ([place]) Interventions    Readmission Risk Interventions No flowsheet data found.

## 2020-03-23 NOTE — Plan of Care (Signed)

## 2020-03-23 NOTE — Progress Notes (Signed)
PROGRESS NOTE    Whitney [name]  HTD:[id number] DOB: [birth year] DOA: 03/02/2020 PCP: Patient, No Pcp Per    Brief Narrative:  85 year old female with insulin-dependent diabetes, chronic kidney disease stage IIIa, bedbound status for last 12 years as per patient, right hip fracture when she was in [place] about a month ago prior to arrival treated conservatively brought to the [place] by her daughter.  At the same time, patient probably also had right distal tibia and fibula fracture.  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 Remains in the hospital with no appropriate disposition. -Urinary retention status post Foley catheter placed by urology.  Continue Flomax.  There is no previous history of retention, will do voiding trial.   Assessment & Plan:   Principal Problem:   Intractable pain Active Problems:   Diabetes mellitus without complication (HCC)   CKD (chronic kidney disease), stage III   Closed right hip fracture (HCC)   Protein calorie malnutrition (HCC)   Bilateral leg edema   Closed right hip fracture, initial encounter ([place])  Multiple spontaneous osteoporotic fractures in a patient with bedbound and advanced deconditioning: -Multiple fractures on conservative management.  Bed mobility.  PT OT, transfers.  Adequate pain medications.  Severe comorbidities and high mortality in near future.  Palliative care consultation and follow-up.  Adequate pain medications.  Patient will benefit with outpatient palliative care follow-up and subsequent hospice.  Called and discussed with palliative care medicine. On calcitonin for osteoporosis.  Acute urinary retention: Probably due to immobility and fractures.   Catheter was placed by urology due to urethral stricture.   Voiding trial today.  She is worried about swelling her bed.  We will give her pure wick.  Type 2 diabetes with CKD stage IIIa: Blood sugars fairly stable on insulin regimen.  Constipation: Resolved.  Patient now complains of abdominal cramping and diarrhea.  Discontinue all laxatives. Continues to complain of epigastric pain.  Has history of cholelithiasis.  Will check right upper quadrant ultrasound.  Also check CBC, electrolytes, liver function test and magnesium phosphorus.   DVT prophylaxis: SCDs Code Status: DNR/DNI Family Communication: Patient's daughter at the bedside Disposition Plan: Status is: Inpatient  Remains inpatient appropriate because:Unsafe d/c plan   Dispo: The patient is from: Home              Anticipated d/c is to: Home              Anticipated d/c date is: > 3 days              Patient currently is medically stable to d/c.          Consultants:   Orthopedics  Procedures:   None  Antimicrobials:  Anti-infectives (From admission, onward)   Start     Dose/Rate Route Frequency Ordered Stop   03/09/20 1030  ciprofloxacin (CIPRO) tablet 250 mg  Status:  Discontinued     250 mg Oral Daily with breakfast 03/09/20 1020 03/09/20 1200   03/08/20 2000  ciprofloxacin (CIPRO) tablet 250 mg  Status:  Discontinued     250 mg Oral 2 times daily 03/08/20 1753 03/09/20 1020   03/05/20 2100  ceFAZolin (ANCEF) IVPB 2g/100 mL premix     2 g 200 mL/hr over 30 Minutes Intravenous Every 8  hours 03/05/20 2043 03/06/20 0547   03/05/20 0818  vancomycin (VANCOCIN) powder  Status:  Discontinued       As needed 03/05/20 0819 03/05/20 0913   03/05/20 0600  ceFAZolin (ANCEF) IVPB 2g/100 mL premix  Status:  Discontinued     2 g 200 mL/hr over 30 Minutes Intravenous On call to O.R. 03/04/20 1713 03/05/20 1055         Subjective: Patient seen and examined.  Daughter at the bedside.  Patient herself was still  complaining of diffuse epigastric pain, cramping and poor appetite.  No other complaints.  No pain.   Objective: Vitals:   03/22/20 1436 03/22/20 2020 03/23/20 0300 03/23/20 0803  BP: (!) 115/45 (!) 114/55 (!) 108/57 (!) 129/49  Pulse: 75 75 71 78  Resp: 18 15 16    Temp: 98.3 F (36.8 C) 98.9 F (37.2 C) 98.1 F (36.7 C) 98.2 F (36.8 C)  TempSrc: Oral Oral Oral Oral  SpO2: 92% 91% 93% 92%  Weight:      Height:        Intake/Output Summary (Last 24 hours) at 03/23/2020 1115 Last data filed at 03/22/2020 1846 Gross per 24 hour  Intake 240 ml  Output 425 ml  Net -185 ml   Filed Weights   03/03/20 0421 03/03/20 0838  Weight: 79.7 kg 79.7 kg    Examination:  General exam: Appears calm and comfortable.  Not in any distress. Respiratory system: Clear to auscultation. Respiratory effort normal. Cardiovascular system: S1 & S2 heard, RRR.  Gastrointestinal system: Abdomen is nondistended, soft and nontender.  Umbilical hernia present.  No localized tenderness. Central nervous system: Alert and oriented. No focal neurological deficits. Both legs on posterior slab.     Data Reviewed: I have personally reviewed following labs and imaging studies  CBC: Recent Labs  Lab 03/18/20 1110  WBC 8.7  HGB 10.0*  HCT 33.0*  MCV 100.0  PLT 474   Basic Metabolic Panel: Recent Labs  Lab 03/18/20 1110 03/19/20 0508  NA 142  --   K 4.5  --   CL 113*  --   CO2 23  --   GLUCOSE 135*  --   BUN 27*  --   CREATININE 1.35* 1.47*  CALCIUM 7.6*  --    GFR: Estimated Creatinine Clearance: 28.8 mL/min (A) (by C-G formula based on SCr of 1.47 mg/dL (H)). Liver Function Tests: Recent Labs  Lab 03/18/20 1110  AST 29  ALT 13  ALKPHOS 252*  BILITOT 1.0  PROT 5.3*  ALBUMIN 1.6*   Recent Labs  Lab 03/18/20 1110  LIPASE 14   No results for input(s): AMMONIA in the last 168 hours. Coagulation Profile: No results for input(s): INR, PROTIME in the last 168 hours. Cardiac  Enzymes: No results for input(s): CKTOTAL, CKMB, CKMBINDEX, TROPONINI in the last 168 hours. BNP (last 3 results) No results for input(s): PROBNP in the last 8760 hours. HbA1C: No results for input(s): HGBA1C in the last 72 hours. CBG: Recent Labs  Lab 03/22/20 0822 03/22/20 1129 03/22/20 1614 03/22/20 2021 03/23/20 0756  GLUCAP 115* 130* 145* 133* 107*   Lipid Profile: No results for input(s): CHOL, HDL, LDLCALC, TRIG, CHOLHDL, LDLDIRECT in the last 72 hours. Thyroid Function Tests: No results for input(s): TSH, T4TOTAL, FREET4, T3FREE, THYROIDAB in the last 72 hours. Anemia Panel: No results for input(s): VITAMINB12, FOLATE, FERRITIN, TIBC, IRON, RETICCTPCT in the last 72 hours. Sepsis Labs: No results for input(s): PROCALCITON, LATICACIDVEN in the last  168 hours.  No results found for this or any previous visit (from the past 240 hour(s)).       Radiology Studies: No results found.      Scheduled Meds: . calcitonin (salmon)  1 spray Alternating Nares Daily  . Chlorhexidine Gluconate Cloth  6 each Topical Daily  . feeding supplement (GLUCERNA SHAKE)  237 mL Oral TID BM  . insulin aspart  0-5 Units Subcutaneous QHS  . insulin aspart  0-9 Units Subcutaneous TID WC  . insulin glargine  5 Units Subcutaneous QHS  . LORazepam  1 mg Intravenous Once  . multivitamin with minerals  1 tablet Oral Daily  . sodium chloride flush  3 mL Intravenous Q12H  . tamsulosin  0.4 mg Oral Daily   Continuous Infusions: . sodium chloride    . methocarbamol (ROBAXIN) IV       LOS: 20 days    Time spent: 25 minutes    Barb Merino, MD [facility] Pager [phone]

## 2020-03-23 NOTE — Progress Notes (Signed)
Daily Progress Note   Patient Name: Whitney [name]       Date: 03/23/2020 DOB: 1935/02/22  Age: 85 y.o. MRN#: [id number] Attending Physician: Barb Merino, MD Primary Care Physician: Patient, No Pcp Per Admit Date: 03/02/2020  Reason for Consultation/Follow-up: Establishing goals of care  Subjective: Met at bedside with patient's daughter for followup discussion. Daughter's goal of care is to take patient home. Attempted to discuss goals of care and prognosis. Expressed worry about possible fast decline once discharged home and what goals of care would be. Discussed options of Palliative support vs Hospice.  [name] was very clear that she did not agree with Hospice philosophy of care and felt it was against their muslim religion believing all attempts at aggressive care must be made until death determined by their God.    [name]'s main concern is seeking daily assistance with baths.   Length of Stay: 20  Current Medications: Scheduled Meds:  . calcitonin (salmon)  1 spray Alternating Nares Daily  . Chlorhexidine Gluconate Cloth  6 each Topical Daily  . feeding supplement (GLUCERNA SHAKE)  237 mL Oral TID BM  . insulin aspart  0-5 Units Subcutaneous QHS  . insulin aspart  0-9 Units Subcutaneous TID WC  . insulin glargine  5 Units Subcutaneous QHS  . LORazepam  1 mg Intravenous Once  . multivitamin with minerals  1 tablet Oral Daily  . sodium chloride flush  3 mL Intravenous Q12H  . tamsulosin  0.4 mg Oral Daily    Continuous Infusions: . sodium chloride    . methocarbamol (ROBAXIN) IV      PRN Meds: sodium chloride, acetaminophen, acetaminophen, alum & mag hydroxide-simeth, HYDROcodone-acetaminophen, HYDROcodone-acetaminophen, menthol-cetylpyridinium **OR** phenol, methocarbamol  **OR** methocarbamol (ROBAXIN) IV, ondansetron **OR** ondansetron (ZOFRAN) IV, sodium chloride flush   Vital Signs: BP (!) 129/49 (BP Location: Right Arm)   Pulse 78   Temp 98.2 F (36.8 C) (Oral)   Resp 16   Ht 5' 2"  (1.575 m)   Wt 79.7 kg   SpO2 92%   BMI 32.14 kg/m  SpO2: SpO2: 92 % O2 Device: O2 Device: Room Air O2 Flow Rate: O2 Flow Rate (L/min): 2 L/min  Intake/output summary:   Intake/Output Summary (Last 24 hours) at 03/23/2020 1322 Last data filed at 03/22/2020 1846 Gross per 24 hour  Intake 240  ml  Output 425 ml  Net -185 ml   LBM: Last BM Date: 03/22/20 Baseline Weight: Weight: 79.7 kg Most recent weight: Weight: 79.7 kg       Palliative Assessment/Data: PPS: 30%      Patient Active Problem List   Diagnosis Date Noted  . Closed right hip fracture, initial encounter ([place]) 03/03/2020  . Diabetes mellitus without complication ([place])   . CKD (chronic kidney disease), stage III   . Closed right hip fracture ([place])   . Protein calorie malnutrition ([place])   . Bilateral leg edema   . Intractable pain 03/02/2020    Palliative Care Assessment & Plan   Patient Profile:  85 y.o. female  with past medical history of IDDM, CKD stage III and right hip fracture 1 month ago in [place]. She was treated in a hospital in [place] with pain medication and Lovenox to fly to the [place] to be with her daughter. Upon arrival patient was seen at a hospital in [place] but family choose to bring her to [place] where her daughter lives for definitive treatment of increasing right hip pain. She was admitted on 03/02/2020 with complaints of increasing right hip pain.She had a Girdlestone procedure on 03/05/20 with a intraoperative nondisplaced  sprial fracture of the distal tibial shaft for which a splint was placed. She is nonweightbearing right lower extremity.  She had been nonambulatory for 8 years following a left hip replacement 8 years ago.   Assessment/Recommendations/Plan   GOC  continue to be full medical care with DNR limit  Patient and family are open to Palliative services- however, she does not have a payer source. I have reached out to [facility] and they declined to take patient without payer, I have left a message with [facility]  Of note- per care manager RN patient does have HH/MSW/Aide arranged through [facility] - will ask for referral to their Palliative service  Goals of Care and Additional Recommendations:  Limitations on Scope of Treatment: Full Scope Treatment  Code Status:  DNR  Prognosis:   Unable to determine  Discharge Planning:  Home with [place] was discussed with   Thank you for allowing the Palliative Medicine Team to assist in the care of this patient.   Time In: 9628 Time Out: 1345 Total Time 60 mins Prolonged Time Billed yes      Greater than 50%  of this time was spent counseling and coordinating care related to the above assessment and plan.  Mariana Kaufman, AGNP-C Palliative Medicine   Please contact Palliative Medicine Team phone at [phone] for questions and concerns.

## 2020-03-23 NOTE — Progress Notes (Addendum)
Daughter fed patient at 93 and said they can reschedule [place] for tomorrow.  Will notify us.   Clarise Cruz in [place] aware, stated she will schedule patient for morning.  Dr. Sloan Leiter notified.

## 2020-03-23 NOTE — Progress Notes (Signed)
Foley removed as per MD order, purewick placed, family educated as to drinking fluids and urinating within next 3 hours.  Daughter at bedside translating.

## 2020-03-24 ENCOUNTER — Other Ambulatory Visit (HOSPITAL_COMMUNITY): Payer: 59

## 2020-03-24 ENCOUNTER — Inpatient Hospital Stay (HOSPITAL_COMMUNITY): Payer: 59

## 2020-03-24 LAB — GLUCOSE, CAPILLARY
Glucose-Capillary: 134 mg/dL — ABNORMAL HIGH (ref 70–99)
Glucose-Capillary: 121 mg/dL — ABNORMAL HIGH (ref 70–99)
Glucose-Capillary: 148 mg/dL — ABNORMAL HIGH (ref 70–99)
Glucose-Capillary: 179 mg/dL — ABNORMAL HIGH (ref 70–99)

## 2020-03-24 MED ORDER — FAMOTIDINE 20 MG PO TABS
20.0000 mg | ORAL_TABLET | Freq: Two times a day (BID) | ORAL | Status: DC
  Administered 2020-03-24 – 2020-03-30 (×11): 20 mg via ORAL
  Filled 2020-03-24 (×15): qty 1

## 2020-03-24 NOTE — Progress Notes (Addendum)
PT Cancellation Note  Patient Details Name: Kynzlee Hucker MRN: [id number] DOB: 1935-01-03   Cancelled Treatment:    Reason Eval/Treat Not Completed: (P) Other (comment)(Daughter reports patient does not want to do anymore therapy because she doesn't want to break any bones.  Educated daughter on risks of immobility and she continues to report she does not want PT intervention for her mother.  Will defer tx at this time.)  Will inform supervising PT of patient wishes to d/c PT at this time.    Physical Therapy Discharge Patient Details Name: Nakina Spatz MRN: [id number] DOB: Mar 09, 1935 Today's Date: 03/24/2020 Time:  -     Patient discharged from PT services secondary to patient has made no progress toward goals in a reasonable time frame and she is requesting PT to stop intervention.   Please see latest therapy progress note for current level of functioning and progress toward goals.    Progress and discharge plan discussed with patient and/or caregiver: Patient/Caregiver agrees with plan  GP     Aimee Eli Hose 03/24/2020, 4:53 PM   Aimee Eli Hose 03/24/2020, 4:52 PM Erasmo Leventhal , PTA Acute Rehabilitation Services Pager [phone] Office [phone]

## 2020-03-24 NOTE — Progress Notes (Signed)
PROGRESS NOTE    Whitney [name]  TZG:[id number] DOB: 1935/07/28 DOA: 03/02/2020 PCP: Patient, No Pcp Per    Brief Narrative:  85 year old female with insulin-dependent diabetes, chronic kidney disease stage IIIa, bedbound status for last 12 years as per patient, right hip fracture when she was in [place] about a month ago prior to arrival treated conservatively brought to the [place] by her daughter.  At the same time, patient probably also had right distal tibia and fibula fracture.  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 Remains in the hospital with no appropriate disposition. -Urinary retention status post Foley catheter placed by urology.  5/13, successful voiding trial.   Assessment & Plan:   Principal Problem:   Intractable pain Active Problems:   Diabetes mellitus without complication (HCC)   CKD (chronic kidney disease), stage III   Closed right hip fracture (HCC)   Protein calorie malnutrition (HCC)   Bilateral leg edema   Closed right hip fracture, initial encounter (HCC)   Dehydration   Pain   Goals of care, counseling/discussion   Advanced care planning/counseling discussion   Palliative care by specialist  Multiple spontaneous osteoporotic fractures in a patient with bedbound and advanced deconditioning: -Multiple fractures on conservative management.  Bed mobility.  PT OT, transfers.  Adequate pain medications.  Severe comorbidities and high mortality in near future.  Palliative care consultation and follow-up.  Adequate pain medications.  Patient will benefit with outpatient palliative care follow-up and subsequent hospice.  On calcitonin for osteoporosis.  Acute urinary retention: Probably  due to immobility and fractures.  Improved.  Type 2 diabetes with CKD stage IIIa: Blood sugars fairly stable on insulin regimen.  Constipation: Resolved.  Complaint of abdominal cramping.  Some improvement now.   Started on Pepcid.  Liver function test normal.   Right upper quadrant ultrasound shows cholelithiasis but no acute cholecystitis.    DVT prophylaxis: SCDs Code Status: DNR/DNI Family Communication: Patient's daughter at the bedside 5/13. Disposition Plan: Status is: Inpatient  Remains inpatient appropriate because:Unsafe d/c plan   Dispo: The patient is from: Home              Anticipated d/c is to: Home              Anticipated d/c date is: > 3 days              Patient currently is medically stable to d/c. Patient has multiple comorbidities.  She has no safe disposition.  Patient's daughter is trying to take her home after she has appropriate apartment arrangements and DME supplies.         Consultants:   Orthopedics  Palliative medicine  Procedures:   None  Antimicrobials:  Anti-infectives (From admission, onward)   Start     Dose/Rate Route Frequency Ordered Stop   03/09/20 1030  ciprofloxacin (CIPRO) tablet 250 mg  Status:  Discontinued     250 mg Oral Daily with breakfast 03/09/20 1020 03/09/20 1200   03/08/20 2000  ciprofloxacin (CIPRO) tablet 250 mg  Status:  Discontinued     250 mg Oral 2 times daily 03/08/20 1753 03/09/20 1020   03/05/20 2100  ceFAZolin (ANCEF) IVPB 2g/100 mL premix     2 g 200 mL/hr over 30 Minutes Intravenous Every 8  hours 03/05/20 2043 03/06/20 0547   03/05/20 0818  vancomycin (VANCOCIN) powder  Status:  Discontinued       As needed 03/05/20 0819 03/05/20 0913   03/05/20 0600  ceFAZolin (ANCEF) IVPB 2g/100 mL premix  Status:  Discontinued     2 g 200 mL/hr over 30 Minutes Intravenous On call to O.R. 03/04/20 1713 03/05/20 1055         Subjective: Patient seen and examined.  No overnight events.  She asked me to get her  nurses telephone so she can ask for pain medicine for her right hip.  No other overnight events.  No family at the bedside.   Objective: Vitals:   03/23/20 1530 03/23/20 1942 03/24/20 0300 03/24/20 0720  BP: (!) 128/50 (!) 119/59 (!) 128/52 (!) 140/54  Pulse: 88 82 80 86  Resp:  18 16 17   Temp: 98.2 F (36.8 C) 98 F (36.7 C) 98.3 F (36.8 C) 97.8 F (36.6 C)  TempSrc: Oral Oral Oral   SpO2: 97% 91% 90% 93%  Weight:      Height:        Intake/Output Summary (Last 24 hours) at 03/24/2020 1218 Last data filed at 03/24/2020 0900 Gross per 24 hour  Intake 60 ml  Output 275 ml  Net -215 ml   Filed Weights   03/03/20 0421 03/03/20 0838  Weight: 79.7 kg 79.7 kg    Examination:  General exam: Appears calm and comfortable.  Not in any distress.  She looks comfortable. Respiratory system: Clear to auscultation. Respiratory effort normal. Cardiovascular system: S1 & S2 heard, RRR.  Gastrointestinal system: Abdomen is nondistended, soft and nontender.  Umbilical hernia present.  No localized tenderness. Central nervous system: Alert and oriented. No focal neurological deficits. Both legs on posterior slab.  Right lateral hip incision clean and dry.    Data Reviewed: I have personally reviewed following labs and imaging studies  CBC: Recent Labs  Lab 03/18/20 1110 03/23/20 1137  WBC 8.7 4.7  NEUTROABS  --  2.4  HGB 10.0* 10.0*  HCT 33.0* 33.0*  MCV 100.0 102.8*  PLT 241 466   Basic Metabolic Panel: Recent Labs  Lab 03/18/20 1110 03/19/20 0508 03/23/20 1137  NA 142  --  143  K 4.5  --  4.6  CL 113*  --  114*  CO2 23  --  23  GLUCOSE 135*  --  139*  BUN 27*  --  26*  CREATININE 1.35* 1.47* 1.34*  CALCIUM 7.6*  --  7.3*  MG  --   --  2.0  PHOS  --   --  3.6   GFR: Estimated Creatinine Clearance: 31.6 mL/min (A) (by C-G formula based on SCr of 1.34 mg/dL (H)). Liver Function Tests: Recent Labs  Lab 03/18/20 1110 03/23/20 1137  AST 29 35  ALT 13 15    ALKPHOS 252* 281*  BILITOT 1.0 0.8  PROT 5.3* 5.3*  ALBUMIN 1.6* 1.5*   Recent Labs  Lab 03/18/20 1110  LIPASE 14   No results for input(s): AMMONIA in the last 168 hours. Coagulation Profile: No results for input(s): INR, PROTIME in the last 168 hours. Cardiac Enzymes: No results for input(s): CKTOTAL, CKMB, CKMBINDEX, TROPONINI in the last 168 hours. BNP (last 3 results) No results for input(s): PROBNP in the last 8760 hours. HbA1C: No results for input(s): HGBA1C in the last 72 hours. CBG: Recent Labs  Lab 03/23/20 1203 03/23/20 1800 03/23/20 2055 03/24/20 0740 03/24/20 1152  GLUCAP 128* 109* 130* 134* 121*   Lipid Profile: No results for input(s): CHOL, HDL, LDLCALC, TRIG, CHOLHDL, LDLDIRECT in the last 72 hours. Thyroid Function Tests: No results for input(s): TSH, T4TOTAL, FREET4, T3FREE, THYROIDAB in the last 72 hours. Anemia Panel: No results for input(s): VITAMINB12, FOLATE, FERRITIN, TIBC, IRON, RETICCTPCT in the last 72 hours. Sepsis Labs: No results for input(s): PROCALCITON, LATICACIDVEN in the last 168 hours.  No results found for this or any previous visit (from the past 240 hour(s)).       Radiology Studies: US Abdomen Limited RUQ  Result Date: 03/24/2020 CLINICAL DATA:  Abdominal pain. EXAM: ULTRASOUND ABDOMEN LIMITED RIGHT UPPER QUADRANT COMPARISON:  CT abdomen pelvis dated March 09, 2020. FINDINGS: Gallbladder: 1.5 cm gallstone. No wall thickening visualized. No sonographic Murphy sign noted by sonographer. Common bile duct: Diameter: 1 mm, normal. Liver: No focal lesion identified. Nodular contour with coarsened parenchymal echogenicity. Portal vein is patent on color Doppler imaging with normal direction of blood flow towards the liver. Other: Small perihepatic ascites. IMPRESSION: 1. Cirrhosis with small perihepatic ascites. 2. Cholelithiasis. Electronically Signed   By: Titus Dubin M.D.   On: 03/24/2020 10:05        Scheduled Meds: .  calcitonin (salmon)  1 spray Alternating Nares Daily  . Chlorhexidine Gluconate Cloth  6 each Topical Daily  . famotidine  20 mg Oral BID  . feeding supplement (GLUCERNA SHAKE)  237 mL Oral TID BM  . insulin aspart  0-5 Units Subcutaneous QHS  . insulin aspart  0-9 Units Subcutaneous TID WC  . insulin glargine  5 Units Subcutaneous QHS  . LORazepam  1 mg Intravenous Once  . multivitamin with minerals  1 tablet Oral Daily  . sodium chloride flush  3 mL Intravenous Q12H  . tamsulosin  0.4 mg Oral Daily   Continuous Infusions: . sodium chloride    . methocarbamol (ROBAXIN) IV       LOS: 21 days    Time spent: 25 minutes    Barb Merino, MD [facility] Pager [phone]

## 2020-03-24 NOTE — Progress Notes (Signed)
OT Cancellation Note  Patient Details Name: Whitney [name] MRN: [id number] DOB: 06-19-1935   Cancelled Treatment:    Reason Eval/Treat Not Completed: Other (comment) Met with daughter in hallway prior to session. Dtr states that she has explained to pt that she is a DNR and that the pt is requesting no more therapy services and just wants to be comfortable. Daughter has received education from previous therapists, has no further questions. Requested therapy no longer return and that she will mobilize her mother for bathing, etc. OT will sign off at this point, please re consult if changes arise.  Zenovia Jarred, MSOT, OTR/L Acute Rehabilitation Services [facility] Office Number: [phone] Pager: [phone]  Zenovia Jarred 03/24/2020, 4:15 PM

## 2020-03-24 NOTE — Plan of Care (Signed)
  Problem: Education: Goal: Verbalization of understanding the information provided (i.e., activity precautions, restrictions, etc) will improve Outcome: Progressing   Problem: Pain Management: Goal: Pain level will decrease Outcome: Progressing   Problem: Education: Goal: Knowledge of General Education information will improve Description: Including pain rating scale, medication(s)/side effects and non-pharmacologic comfort measures Outcome: Progressing   Problem: Nutrition: Goal: Adequate nutrition will be maintained Outcome: Progressing   Problem: Activity: Goal: Ability to ambulate and perform ADLs will improve Outcome: Not Progressing   Problem: Activity: Goal: Risk for activity intolerance will decrease Outcome: Not Progressing
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
## 2020-03-25 LAB — GLUCOSE, CAPILLARY
Glucose-Capillary: 134 mg/dL — ABNORMAL HIGH (ref 70–99)
Glucose-Capillary: 129 mg/dL — ABNORMAL HIGH (ref 70–99)
Glucose-Capillary: 156 mg/dL — ABNORMAL HIGH (ref 70–99)
Glucose-Capillary: 97 mg/dL (ref 70–99)

## 2020-03-25 NOTE — Progress Notes (Signed)
PROGRESS NOTE    Whitney [name]  BSW:[id number] DOB: 1935/07/29 DOA: 03/02/2020 PCP: Patient, No Pcp Per    Brief Narrative:  85 year old female with insulin-dependent diabetes, chronic kidney disease stage IIIa, bedbound status for last 12 years as per patient, right hip fracture when she was in [place] about a month ago prior to arrival treated conservatively brought to the [place] by her daughter.  At the same time, patient probably also had right distal tibia and fibula fracture.  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 Remains in the hospital with no appropriate disposition. -Urinary retention status post Foley catheter placed by urology.  5/13, successful voiding trial.   Assessment & Plan:   Principal Problem:   Intractable pain Active Problems:   Diabetes mellitus without complication (HCC)   CKD (chronic kidney disease), stage III   Closed right hip fracture (HCC)   Protein calorie malnutrition (HCC)   Bilateral leg edema   Closed right hip fracture, initial encounter (HCC)   Dehydration   Pain   Goals of care, counseling/discussion   Advanced care planning/counseling discussion   Palliative care by specialist  Multiple spontaneous osteoporotic fractures in a patient with bedbound and advanced deconditioning: -Multiple fractures on conservative management.  Bed mobility.  PT OT, transfers.  Adequate pain medications.  Severe comorbidities and high mortality in near future.  Palliative care consultation and follow-up.  Adequate pain medications.  Patient will benefit with outpatient palliative care follow-up and subsequent hospice.  On calcitonin for osteoporosis.  Acute urinary retention: Probably  due to immobility and fractures.  Improved.  Type 2 diabetes with CKD stage IIIa: Blood sugars fairly stable on insulin regimen.  Constipation: Resolved.  Complaint of abdominal cramping.  Some improvement now.   Started on Pepcid.  Liver function test normal.  Right upper quadrant ultrasound shows cholelithiasis but no acute cholecystitis.    DVT prophylaxis: SCDs Code Status: DNR/DNI Family Communication: Patient's daughter at the bedside 5/13. Disposition Plan: Status is: Inpatient  Remains inpatient appropriate because:Unsafe d/c plan   Dispo: The patient is from: Home              Anticipated d/c is to: Home              Anticipated d/c date is: > 3 days              Patient currently is medically stable to d/c. Patient has multiple comorbidities.  She has no safe disposition.  Patient's daughter is trying to take her home after she has appropriate apartment arrangements and DME supplies.         Consultants:   Orthopedics  Palliative medicine  Procedures:   None  Antimicrobials:  Anti-infectives (From admission, onward)   Start     Dose/Rate Route Frequency Ordered Stop   03/09/20 1030  ciprofloxacin (CIPRO) tablet 250 mg  Status:  Discontinued     250 mg Oral Daily with breakfast 03/09/20 1020 03/09/20 1200   03/08/20 2000  ciprofloxacin (CIPRO) tablet 250 mg  Status:  Discontinued     250 mg Oral 2 times daily 03/08/20 1753 03/09/20 1020   03/05/20 2100  ceFAZolin (ANCEF) IVPB 2g/100 mL premix     2 g 200 mL/hr over 30 Minutes Intravenous Every 8 hours  03/05/20 2043 03/06/20 0547   03/05/20 0818  vancomycin (VANCOCIN) powder  Status:  Discontinued       As needed 03/05/20 0819 03/05/20 0913   03/05/20 0600  ceFAZolin (ANCEF) IVPB 2g/100 mL premix  Status:  Discontinued     2 g 200 mL/hr over 30 Minutes Intravenous On call to O.R. 03/04/20 1713 03/05/20 1055         Subjective: No change in condition.  She adamantly refused to work with PT OT.  She was  agitated overnight, probably due to language barrier. She complained of wetting her bed with her urine today.   Objective: Vitals:   03/24/20 1631 03/24/20 1951 03/25/20 0340 03/25/20 0918  BP: 115/62 (!) 125/52 135/72 128/60  Pulse: 68 74 71 71  Resp: 18 14 14 18   Temp: 98.9 F (37.2 C) 97.9 F (36.6 C) 97.7 F (36.5 C) 98 F (36.7 C)  TempSrc:  Oral Oral   SpO2: 96% 93% 94% 96%  Weight:      Height:        Intake/Output Summary (Last 24 hours) at 03/25/2020 1117 Last data filed at 03/24/2020 2000 Gross per 24 hour  Intake --  Output 500 ml  Net -500 ml   Filed Weights   03/03/20 0421 03/03/20 0838  Weight: 79.7 kg 79.7 kg    Examination:  General exam: Appears calm and comfortable.  Not in any distress.  She looks comfortable. Respiratory system: Clear to auscultation. Respiratory effort normal. Cardiovascular system: S1 & S2 heard, RRR.  Gastrointestinal system: Abdomen is nondistended, soft and nontender.  Umbilical hernia present.  No localized tenderness. Central nervous system: Alert and oriented. No focal neurological deficits. Both legs on posterior slab.  Right lateral hip incision clean and dry.    Data Reviewed: I have personally reviewed following labs and imaging studies  CBC: Recent Labs  Lab 03/23/20 1137  WBC 4.7  NEUTROABS 2.4  HGB 10.0*  HCT 33.0*  MCV 102.8*  PLT 335   Basic Metabolic Panel: Recent Labs  Lab 03/19/20 0508 03/23/20 1137  NA  --  143  K  --  4.6  CL  --  114*  CO2  --  23  GLUCOSE  --  139*  BUN  --  26*  CREATININE 1.47* 1.34*  CALCIUM  --  7.3*  MG  --  2.0  PHOS  --  3.6   GFR: Estimated Creatinine Clearance: 31.6 mL/min (A) (by C-G formula based on SCr of 1.34 mg/dL (H)). Liver Function Tests: Recent Labs  Lab 03/23/20 1137  AST 35  ALT 15  ALKPHOS 281*  BILITOT 0.8  PROT 5.3*  ALBUMIN 1.5*   No results for input(s): LIPASE, AMYLASE in the last 168 hours. No results for input(s): AMMONIA in  the last 168 hours. Coagulation Profile: No results for input(s): INR, PROTIME in the last 168 hours. Cardiac Enzymes: No results for input(s): CKTOTAL, CKMB, CKMBINDEX, TROPONINI in the last 168 hours. BNP (last 3 results) No results for input(s): PROBNP in the last 8760 hours. HbA1C: No results for input(s): HGBA1C in the last 72 hours. CBG: Recent Labs  Lab 03/24/20 0740 03/24/20 1152 03/24/20 1627 03/24/20 2123 03/25/20 0832  GLUCAP 134* 121* 148* 179* 134*   Lipid Profile: No results for input(s): CHOL, HDL, LDLCALC, TRIG, CHOLHDL, LDLDIRECT in the last 72 hours. Thyroid Function Tests: No results for input(s): TSH, T4TOTAL, FREET4, T3FREE, THYROIDAB in the last 72 hours. Anemia Panel: No results  for input(s): VITAMINB12, FOLATE, FERRITIN, TIBC, IRON, RETICCTPCT in the last 72 hours. Sepsis Labs: No results for input(s): PROCALCITON, LATICACIDVEN in the last 168 hours.  No results found for this or any previous visit (from the past 240 hour(s)).       Radiology Studies: US Abdomen Limited RUQ  Result Date: 03/24/2020 CLINICAL DATA:  Abdominal pain. EXAM: ULTRASOUND ABDOMEN LIMITED RIGHT UPPER QUADRANT COMPARISON:  CT abdomen pelvis dated March 09, 2020. FINDINGS: Gallbladder: 1.5 cm gallstone. No wall thickening visualized. No sonographic Murphy sign noted by sonographer. Common bile duct: Diameter: 1 mm, normal. Liver: No focal lesion identified. Nodular contour with coarsened parenchymal echogenicity. Portal vein is patent on color Doppler imaging with normal direction of blood flow towards the liver. Other: Small perihepatic ascites. IMPRESSION: 1. Cirrhosis with small perihepatic ascites. 2. Cholelithiasis. Electronically Signed   By: Titus Dubin M.D.   On: 03/24/2020 10:05        Scheduled Meds: . calcitonin (salmon)  1 spray Alternating Nares Daily  . Chlorhexidine Gluconate Cloth  6 each Topical Daily  . famotidine  20 mg Oral BID  . feeding supplement  (GLUCERNA SHAKE)  237 mL Oral TID BM  . insulin aspart  0-5 Units Subcutaneous QHS  . insulin aspart  0-9 Units Subcutaneous TID WC  . insulin glargine  5 Units Subcutaneous QHS  . LORazepam  1 mg Intravenous Once  . multivitamin with minerals  1 tablet Oral Daily  . sodium chloride flush  3 mL Intravenous Q12H  . tamsulosin  0.4 mg Oral Daily   Continuous Infusions: . sodium chloride    . methocarbamol (ROBAXIN) IV       LOS: 22 days    Time spent: 25 minutes    Barb Merino, MD [facility] Pager [phone]

## 2020-03-25 NOTE — Plan of Care (Signed)
  Problem: Pain Management: Goal: Pain level will decrease Outcome: Progressing   

## 2020-03-26 LAB — CREATININE, SERUM
Creatinine, Ser: 1.28 mg/dL — ABNORMAL HIGH (ref 0.44–1.00)
GFR calc Af Amer: 45 mL/min — ABNORMAL LOW (ref >60)
GFR calc non Af Amer: 39 mL/min — ABNORMAL LOW (ref >60)

## 2020-03-26 LAB — GLUCOSE, CAPILLARY
Glucose-Capillary: 99 mg/dL (ref 70–99)
Glucose-Capillary: 118 mg/dL — ABNORMAL HIGH (ref 70–99)
Glucose-Capillary: 128 mg/dL — ABNORMAL HIGH (ref 70–99)
Glucose-Capillary: 164 mg/dL — ABNORMAL HIGH (ref 70–99)

## 2020-03-26 NOTE — Progress Notes (Signed)
PROGRESS NOTE    Whitney [name]  KXF:[id number] DOB: [birth year] DOA: 03/02/2020 PCP: Patient, No Pcp Per    Brief Narrative:  85 year old female with insulin-dependent diabetes, chronic kidney disease stage IIIa, bedbound status for last 12 years as per patient, right hip fracture when she was in [place] about a month ago prior to arrival treated conservatively brought to the [place] by her daughter.  At the same time, patient probably also had right distal tibia and fibula fracture.  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 Remains in the hospital with no appropriate disposition. -Urinary retention status post Foley catheter placed by urology.  5/13, successful voiding trial.   Assessment & Plan:   Principal Problem:   Intractable pain Active Problems:   Diabetes mellitus without complication (HCC)   CKD (chronic kidney disease), stage III   Closed right hip fracture (HCC)   Protein calorie malnutrition (HCC)   Bilateral leg edema   Closed right hip fracture, initial encounter (HCC)   Dehydration   Pain   Goals of care, counseling/discussion   Advanced care planning/counseling discussion   Palliative care by specialist  Multiple spontaneous osteoporotic fractures in a patient with bedbound and advanced deconditioning: -Multiple fractures on conservative management.  Bed mobility.  PT OT, transfers.  Adequate pain medications.  Severe comorbidities and high mortality in near future.  Palliative care consultation and follow-up.  Adequate pain medications.  Patient will benefit with outpatient palliative care follow-up and subsequent hospice.  On calcitonin for osteoporosis.  Acute urinary retention: Probably  due to immobility and fractures.  Improved.  Type 2 diabetes with CKD stage IIIa: Blood sugars fairly stable on insulin regimen.  Constipation: Resolved.  Complaint of abdominal cramping.  Some improvement now.   Started on Pepcid.  Liver function test normal.  Right upper quadrant ultrasound shows cholelithiasis but no acute cholecystitis.    DVT prophylaxis: SCDs Code Status: DNR/DNI Family Communication: Patient's daughter at the bedside 5/15 Disposition Plan: Status is: Inpatient  Remains inpatient appropriate because:Unsafe d/c plan   Dispo: The patient is from: Home              Anticipated d/c is to: Home              Anticipated d/c date is: > 3 days              Patient currently is medically stable to d/c. Patient has multiple comorbidities.  She has no safe disposition.  Patient's daughter is trying to take her home after she has appropriate apartment arrangements and DME supplies.         Consultants:   Orthopedics  Palliative medicine  Procedures:   None  Antimicrobials:  Anti-infectives (From admission, onward)   Start     Dose/Rate Route Frequency Ordered Stop   03/09/20 1030  ciprofloxacin (CIPRO) tablet 250 mg  Status:  Discontinued     250 mg Oral Daily with breakfast 03/09/20 1020 03/09/20 1200   03/08/20 2000  ciprofloxacin (CIPRO) tablet 250 mg  Status:  Discontinued     250 mg Oral 2 times daily 03/08/20 1753 03/09/20 1020   03/05/20 2100  ceFAZolin (ANCEF) IVPB 2g/100 mL premix     2 g 200 mL/hr over 30 Minutes Intravenous Every 8 hours  03/05/20 2043 03/06/20 0547   03/05/20 0818  vancomycin (VANCOCIN) powder  Status:  Discontinued       As needed 03/05/20 0819 03/05/20 0913   03/05/20 0600  ceFAZolin (ANCEF) IVPB 2g/100 mL premix  Status:  Discontinued     2 g 200 mL/hr over 30 Minutes Intravenous On call to O.R. 03/04/20 1713 03/05/20 1055         Subjective: No change in condition.  Her abdominal pain is  better.   Objective: Vitals:   03/25/20 1619 03/25/20 1937 03/26/20 0334 03/26/20 0805  BP: (!) 131/49 131/64 (!) 127/54 (!) 101/47  Pulse: 76 79 76 81  Resp: 20 14 15 16   Temp: 98 F (36.7 C) 98.2 F (36.8 C) 98.2 F (36.8 C) 98.7 F (37.1 C)  TempSrc:  Oral Oral Oral  SpO2: 94% 95% 91% 93%  Weight:      Height:        Intake/Output Summary (Last 24 hours) at 03/26/2020 1215 Last data filed at 03/26/2020 0830 Gross per 24 hour  Intake 120 ml  Output 400 ml  Net -280 ml   Filed Weights   03/03/20 0421 03/03/20 0838  Weight: 79.7 kg 79.7 kg    Examination:  General exam: Appears calm and comfortable.  Not in any distress.  She looks comfortable. Respiratory system: Clear to auscultation. Respiratory effort normal. Cardiovascular system: S1 & S2 heard, RRR.  Gastrointestinal system: Abdomen is nondistended, soft and nontender.  Umbilical hernia present.  No localized tenderness. Central nervous system: Alert and oriented. No focal neurological deficits. Both legs on posterior slab.  Right lateral hip incision clean and dry.    Data Reviewed: I have personally reviewed following labs and imaging studies  CBC: Recent Labs  Lab 03/23/20 1137  WBC 4.7  NEUTROABS 2.4  HGB 10.0*  HCT 33.0*  MCV 102.8*  PLT 884   Basic Metabolic Panel: Recent Labs  Lab 03/23/20 1137 03/26/20 0704  NA 143  --   K 4.6  --   CL 114*  --   CO2 23  --   GLUCOSE 139*  --   BUN 26*  --   CREATININE 1.34* 1.28*  CALCIUM 7.3*  --   MG 2.0  --   PHOS 3.6  --    GFR: Estimated Creatinine Clearance: 33.1 mL/min (A) (by C-G formula based on SCr of 1.28 mg/dL (H)). Liver Function Tests: Recent Labs  Lab 03/23/20 1137  AST 35  ALT 15  ALKPHOS 281*  BILITOT 0.8  PROT 5.3*  ALBUMIN 1.5*   No results for input(s): LIPASE, AMYLASE in the last 168 hours. No results for input(s): AMMONIA in the last 168 hours. Coagulation Profile: No results for input(s): INR, PROTIME in the  last 168 hours. Cardiac Enzymes: No results for input(s): CKTOTAL, CKMB, CKMBINDEX, TROPONINI in the last 168 hours. BNP (last 3 results) No results for input(s): PROBNP in the last 8760 hours. HbA1C: No results for input(s): HGBA1C in the last 72 hours. CBG: Recent Labs  Lab 03/25/20 1215 03/25/20 1617 03/25/20 2207 03/26/20 0804 03/26/20 1139  GLUCAP 129* 156* 97 99 118*   Lipid Profile: No results for input(s): CHOL, HDL, LDLCALC, TRIG, CHOLHDL, LDLDIRECT in the last 72 hours. Thyroid Function Tests: No results for input(s): TSH, T4TOTAL, FREET4, T3FREE, THYROIDAB in the last 72 hours. Anemia Panel: No results for input(s): VITAMINB12, FOLATE, FERRITIN, TIBC, IRON, RETICCTPCT in the last 72 hours. Sepsis Labs: No results for input(s): PROCALCITON,  LATICACIDVEN in the last 168 hours.  No results found for this or any previous visit (from the past 240 hour(s)).       Radiology Studies: No results found.      Scheduled Meds: . calcitonin (salmon)  1 spray Alternating Nares Daily  . Chlorhexidine Gluconate Cloth  6 each Topical Daily  . famotidine  20 mg Oral BID  . feeding supplement (GLUCERNA SHAKE)  237 mL Oral TID BM  . insulin aspart  0-5 Units Subcutaneous QHS  . insulin aspart  0-9 Units Subcutaneous TID WC  . insulin glargine  5 Units Subcutaneous QHS  . LORazepam  1 mg Intravenous Once  . multivitamin with minerals  1 tablet Oral Daily  . sodium chloride flush  3 mL Intravenous Q12H  . tamsulosin  0.4 mg Oral Daily   Continuous Infusions: . sodium chloride    . methocarbamol (ROBAXIN) IV       LOS: 23 days    Time spent: 20 minutes    Barb Merino, MD [facility] Pager [phone]

## 2020-03-26 NOTE — Plan of Care (Signed)
  Problem: Clinical Measurements: Goal: Postoperative complications will be avoided or minimized Outcome: Progressing   Problem: Self-Concept: Goal: Ability to maintain and perform role responsibilities to the fullest extent possible will improve Outcome: Progressing   Problem: Pain Management: Goal: Pain level will decrease Outcome: Progressing   

## 2020-03-27 LAB — GLUCOSE, CAPILLARY
Glucose-Capillary: 110 mg/dL — ABNORMAL HIGH (ref 70–99)
Glucose-Capillary: 112 mg/dL — ABNORMAL HIGH (ref 70–99)
Glucose-Capillary: 151 mg/dL — ABNORMAL HIGH (ref 70–99)
Glucose-Capillary: 124 mg/dL — ABNORMAL HIGH (ref 70–99)

## 2020-03-27 NOTE — Plan of Care (Signed)
  Problem: Clinical Measurements: Goal: Ability to maintain clinical measurements within normal limits will improve Outcome: Progressing   Problem: Coping: Goal: Level of anxiety will decrease Outcome: Progressing   Problem: Pain Managment: Goal: General experience of comfort will improve Outcome: Progressing   Problem: Safety: Goal: Ability to remain free from injury will improve Outcome: Progressing   Problem: Skin Integrity: Goal: Risk for impaired skin integrity will decrease Outcome: Progressing

## 2020-03-27 NOTE — Progress Notes (Signed)
PROGRESS NOTE    Chakia Counts  AJO:[id number] DOB: 1935-03-01 DOA: 03/02/2020 PCP: Patient, No Pcp Per    Brief Narrative:  85 year old female with insulin-dependent diabetes, chronic kidney disease stage IIIa, bedbound status for last 12 years as per patient, right hip fracture when she was in [place] about a month ago prior to arrival treated conservatively brought to the [place] by her daughter.  At the same time, patient probably also had right distal tibia and fibula fracture.  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 Remains in the hospital with no appropriate disposition. -Urinary retention status post Foley catheter placed by urology.  5/13, successful voiding trial.   Assessment & Plan:   Principal Problem:   Intractable pain Active Problems:   Diabetes mellitus without complication (HCC)   CKD (chronic kidney disease), stage III   Closed right hip fracture (HCC)   Protein calorie malnutrition (HCC)   Bilateral leg edema   Closed right hip fracture, initial encounter (HCC)   Dehydration   Pain   Goals of care, counseling/discussion   Advanced care planning/counseling discussion   Palliative care by specialist  Multiple spontaneous osteoporotic fractures in a patient with bedbound and advanced deconditioning: -Multiple fractures on conservative management.  Bed mobility.  PT OT, transfers.  Adequate pain medications.  Severe comorbidities and high mortality in near future.  Palliative care consultation and follow-up.  Adequate pain medications.  Patient will benefit with outpatient palliative care follow-up and subsequent hospice.  On calcitonin for osteoporosis.  Acute urinary retention: Probably  due to immobility and fractures.  Improved.  Type 2 diabetes with CKD stage IIIa: Blood sugars fairly stable on insulin regimen.  Constipation: Resolved.  Complaint of abdominal cramping.  Some improvement now.   Started on Pepcid.  Liver function test normal.  Right upper quadrant ultrasound shows cholelithiasis but no acute cholecystitis.    DVT prophylaxis: SCDs Code Status: DNR/DNI Family Communication: Patient's daughter at the bedside 5/15 Disposition Plan: Status is: Inpatient  Remains inpatient appropriate because:Unsafe d/c plan   Dispo: The patient is from: Home              Anticipated d/c is to: Home              Anticipated d/c date is: > 3 days              Patient currently is medically stable to d/c. Patient has multiple comorbidities.  She has no safe disposition.  Patient's daughter is trying to take her home after she has appropriate apartment arrangements and DME supplies.         Consultants:   Orthopedics  Palliative medicine  Procedures:   None  Antimicrobials:  Anti-infectives (From admission, onward)   Start     Dose/Rate Route Frequency Ordered Stop   03/09/20 1030  ciprofloxacin (CIPRO) tablet 250 mg  Status:  Discontinued     250 mg Oral Daily with breakfast 03/09/20 1020 03/09/20 1200   03/08/20 2000  ciprofloxacin (CIPRO) tablet 250 mg  Status:  Discontinued     250 mg Oral 2 times daily 03/08/20 1753 03/09/20 1020   03/05/20 2100  ceFAZolin (ANCEF) IVPB 2g/100 mL premix     2 g 200 mL/hr over 30 Minutes Intravenous Every 8 hours  03/05/20 2043 03/06/20 0547   03/05/20 0818  vancomycin (VANCOCIN) powder  Status:  Discontinued       As needed 03/05/20 0819 03/05/20 0913   03/05/20 0600  ceFAZolin (ANCEF) IVPB 2g/100 mL premix  Status:  Discontinued     2 g 200 mL/hr over 30 Minutes Intravenous On call to O.R. 03/04/20 1713 03/05/20 1055         Subjective: No new events.  Does not want to be touched by  nurses.   Objective: Vitals:   03/26/20 1303 03/26/20 1947 03/27/20 0545 03/27/20 0805  BP: 137/74 (!) 119/53 (!) 123/47 (!) 125/50  Pulse: 81 81 83 80  Resp: 15 14 14    Temp: 98.1 F (36.7 C) 99.5 F (37.5 C) 99.5 F (37.5 C) 98.8 F (37.1 C)  TempSrc: Oral Oral Oral Oral  SpO2: 96% 95% 97% 98%  Weight:      Height:        Intake/Output Summary (Last 24 hours) at 03/27/2020 1317 Last data filed at 03/26/2020 1900 Gross per 24 hour  Intake 120 ml  Output 400 ml  Net -280 ml   Filed Weights   03/03/20 0421 03/03/20 0838  Weight: 79.7 kg 79.7 kg    Examination:  Patient looks comfortable.  Her lateral hip incision is clean and dry.  She has bilateral leg with posterior slab on.    Data Reviewed: I have personally reviewed following labs and imaging studies  CBC: Recent Labs  Lab 03/23/20 1137  WBC 4.7  NEUTROABS 2.4  HGB 10.0*  HCT 33.0*  MCV 102.8*  PLT 270   Basic Metabolic Panel: Recent Labs  Lab 03/23/20 1137 03/26/20 0704  NA 143  --   K 4.6  --   CL 114*  --   CO2 23  --   GLUCOSE 139*  --   BUN 26*  --   CREATININE 1.34* 1.28*  CALCIUM 7.3*  --   MG 2.0  --   PHOS 3.6  --    GFR: Estimated Creatinine Clearance: 33.1 mL/min (A) (by C-G formula based on SCr of 1.28 mg/dL (H)). Liver Function Tests: Recent Labs  Lab 03/23/20 1137  AST 35  ALT 15  ALKPHOS 281*  BILITOT 0.8  PROT 5.3*  ALBUMIN 1.5*   No results for input(s): LIPASE, AMYLASE in the last 168 hours. No results for input(s): AMMONIA in the last 168 hours. Coagulation Profile: No results for input(s): INR, PROTIME in the last 168 hours. Cardiac Enzymes: No results for input(s): CKTOTAL, CKMB, CKMBINDEX, TROPONINI in the last 168 hours. BNP (last 3 results) No results for input(s): PROBNP in the last 8760 hours. HbA1C: No results for input(s): HGBA1C in the last 72 hours. CBG: Recent Labs  Lab 03/26/20 1139 03/26/20 1541 03/26/20 2112 03/27/20 0751 03/27/20 1135   GLUCAP 118* 128* 164* 110* 112*   Lipid Profile: No results for input(s): CHOL, HDL, LDLCALC, TRIG, CHOLHDL, LDLDIRECT in the last 72 hours. Thyroid Function Tests: No results for input(s): TSH, T4TOTAL, FREET4, T3FREE, THYROIDAB in the last 72 hours. Anemia Panel: No results for input(s): VITAMINB12, FOLATE, FERRITIN, TIBC, IRON, RETICCTPCT in the last 72 hours. Sepsis Labs: No results for input(s): PROCALCITON, LATICACIDVEN in the last 168 hours.  No results found for this or any previous visit (from the past 240 hour(s)).       Radiology Studies: No results found.      Scheduled Meds: . calcitonin (salmon)  1 spray Alternating Nares  Daily  . Chlorhexidine Gluconate Cloth  6 each Topical Daily  . famotidine  20 mg Oral BID  . feeding supplement (GLUCERNA SHAKE)  237 mL Oral TID BM  . insulin aspart  0-5 Units Subcutaneous QHS  . insulin aspart  0-9 Units Subcutaneous TID WC  . insulin glargine  5 Units Subcutaneous QHS  . LORazepam  1 mg Intravenous Once  . multivitamin with minerals  1 tablet Oral Daily  . sodium chloride flush  3 mL Intravenous Q12H  . tamsulosin  0.4 mg Oral Daily   Continuous Infusions: . sodium chloride    . methocarbamol (ROBAXIN) IV       LOS: 24 days    Time spent: 20 minutes    Barb Merino, MD [facility] Pager [phone]

## 2020-03-28 LAB — GLUCOSE, CAPILLARY
Glucose-Capillary: 116 mg/dL — ABNORMAL HIGH (ref 70–99)
Glucose-Capillary: 134 mg/dL — ABNORMAL HIGH (ref 70–99)
Glucose-Capillary: 126 mg/dL — ABNORMAL HIGH (ref 70–99)

## 2020-03-28 NOTE — TOC Progression Note (Addendum)
Transition of Care [facility]) - Progression Note    Patient Details  Name: Whitney [name] MRN: [id number] Date of Birth: June 30, 1935  Transition of Care [facility]) CM/SW Will, RN Phone Number: 03/28/2020, 10:03 AM  Clinical Narrative:     Case management called and spoke with Thedore Mins with [employer] dme and hospital bed with trapeze, hoyer lift, 3:1, and WC were delivered to the address listed on the facesheet at [address] on 03/29/2020 at 1130.  I called and received no answer from the daughter's cell number, but left a detailed message to determine if she has moved to the first floor apartment that was available to the daughter more than a week ago.  Will continue to contact the daughter to determine when I am able to arrange PTAR transport to the apartment.  The patient's daughter is aware of the plan to discharge the patient since a one story apartment is available to the patient.  [facility] home [facility] is established as receiving care facility and dme has been delivered.  PTAR will be the mode of discharge home since patient is bed bound.  5/18 1200 - Spoke with the patient's daughter concerning patient's plan for discharge.  The patient's daughter is agreeable and anxious to be able to take the patient home.  The daughter received delivery of the hospital bed and other dme equipment yesterday.  The daughter still remains in the upstairs apartment and she said it has been difficult to deal with the apartment complex administration.  I explained that the patient is ready for discharge and that it may be necessary to plan to discharge no later than 03/30/2020 via PTAR to home considering delay in the move to the downstairs apartment.  The daughter is aware and agreeable to Thursday, 03/30/2020 regardless of whether the apartment location has been moved or not.  The daughter has assistance with the move from members of the [employer] that she attends, according to our last  conversation.   Expected Discharge Plan: [place] Barriers to Discharge: Family Issues  Expected Discharge Plan and Services Expected Discharge Plan: [place]   Discharge Planning Services: CM Consult Post Acute Care Choice: Durable Medical Equipment, Home Health Living arrangements for the past 2 months: Apartment(Patient was flown from [place] to [place] to have surgery.)                 DME Arranged: 3-N-1, Hospital bed, Other see comment, Trapeze(Hoyer lift) DME Agency: [employer] Date DME Agency Contacted: 03/06/20 Time DME Agency Contacted: [phone] Representative spoke with at DME Agency: [place]: PT, OT [place] Agency: [facility] (formerly [employer]) Date Protection: 03/06/20 Time [place]: 1548 Representative spoke with at [place]: [place] Interventions    Readmission Risk Interventions No flowsheet data found.

## 2020-03-28 NOTE — Progress Notes (Signed)
PROGRESS NOTE    Whitney [name]  VCB:[id number] DOB: 1935-05-24 DOA: 03/02/2020 PCP: Patient, No Pcp Per    Brief Narrative:  85 year old female with insulin-dependent diabetes, chronic kidney disease stage IIIa, bedbound status for last 12 years as per patient, right hip fracture when she was in [place] about a month ago prior to arrival treated conservatively brought to the [place] by her daughter.  At the same time, patient probably also had right distal tibia and fibula fracture.  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 Remains in the hospital with no appropriate disposition. -Urinary retention status post Foley catheter placed by urology.  5/13, successful voiding trial. -Daughter lives in third floor apartment, moving to first-floor apartment on 5/20 so she will be able to go home with daughter.   Assessment & Plan:   Principal Problem:   Intractable pain Active Problems:   Diabetes mellitus without complication (HCC)   CKD (chronic kidney disease), stage III   Closed right hip fracture (HCC)   Protein calorie malnutrition (HCC)   Bilateral leg edema   Closed right hip fracture, initial encounter (HCC)   Dehydration   Pain   Goals of care, counseling/discussion   Advanced care planning/counseling discussion   Palliative care by specialist  Multiple spontaneous osteoporotic fractures in a patient with bedbound and advanced deconditioning: -Multiple fractures on conservative management.  Bed mobility.  PT OT, transfers.  Adequate pain medications.  Severe comorbidities and high mortality in near future.  Palliative care consultation and follow-up.  Adequate pain medications.  Patient will benefit with  outpatient palliative care follow-up and subsequent hospice.  On calcitonin.  Acute urinary retention: Probably due to immobility and fractures.  Improved.  Type 2 diabetes with CKD stage IIIa: Blood sugars fairly stable on insulin regimen.  Constipation: Resolved. Complaint of abdominal cramping.  Some improvement now.   Started on Pepcid.  Liver function test normal.  Right upper quadrant ultrasound shows cholelithiasis but no acute cholecystitis.    DVT prophylaxis: SCDs Code Status: DNR/DNI Family Communication: Patient's daughter on the phone.  Patient's daughter explains that DME is aware delivered.  Apartment management has not given her first-floor room yet.  They will probably be able to move on 5/20 then she can go home. Disposition Plan: Status is: Inpatient  Remains inpatient appropriate because:Unsafe d/c plan   Dispo: The patient is from: Home              Anticipated d/c is to: Home              Anticipated d/c date is: > 3 days              Patient currently is medically stable to d/c. Patient has multiple comorbidities.  She has no safe disposition.  Patient's daughter is trying to take her home after she has appropriate apartment arrangements and DME supplies.  Consultants:   Orthopedics  Palliative medicine  Procedures:   None  Antimicrobials:  Anti-infectives (From admission, onward)   Start     Dose/Rate Route Frequency Ordered Stop   03/09/20 1030  ciprofloxacin (CIPRO) tablet 250 mg  Status:  Discontinued     250 mg Oral Daily with breakfast 03/09/20 1020 03/09/20 1200   03/08/20 2000  ciprofloxacin (CIPRO) tablet 250  mg  Status:  Discontinued     250 mg Oral 2 times daily 03/08/20 1753 03/09/20 1020   03/05/20 2100  ceFAZolin (ANCEF) IVPB 2g/100 mL premix     2 g 200 mL/hr over 30 Minutes Intravenous Every 8 hours 03/05/20 2043 03/06/20 0547   03/05/20 0818  vancomycin (VANCOCIN) powder  Status:  Discontinued       As needed 03/05/20 0819 03/05/20  0913   03/05/20 0600  ceFAZolin (ANCEF) IVPB 2g/100 mL premix  Status:  Discontinued     2 g 200 mL/hr over 30 Minutes Intravenous On call to O.R. 03/04/20 1713 03/05/20 1055         Subjective: No new events.     Objective: Vitals:   03/27/20 1430 03/27/20 2001 03/28/20 0436 03/28/20 0800  BP: (!) 126/48 (!) 138/52 (!) 143/60 (!) 122/53  Pulse: 70 69 78 75  Resp: 17 16 15 17   Temp: 98.4 F (36.9 C) 98.6 F (37 C) 97.6 F (36.4 C) (!) 97.3 F (36.3 C)  TempSrc: Oral Oral Oral Oral  SpO2: 97% 96% 99% 97%  Weight:      Height:        Intake/Output Summary (Last 24 hours) at 03/28/2020 1330 Last data filed at 03/28/2020 1315 Gross per 24 hour  Intake 3 ml  Output 300 ml  Net -297 ml   Filed Weights   03/03/20 0421 03/03/20 0838  Weight: 79.7 kg 79.7 kg    Examination:  Patient looks comfortable.  Her lateral hip incision is clean and dry.  She has bilateral leg with posterior slab on.    Data Reviewed: I have personally reviewed following labs and imaging studies  CBC: Recent Labs  Lab 03/23/20 1137  WBC 4.7  NEUTROABS 2.4  HGB 10.0*  HCT 33.0*  MCV 102.8*  PLT 811   Basic Metabolic Panel: Recent Labs  Lab 03/23/20 1137 03/26/20 0704  NA 143  --   K 4.6  --   CL 114*  --   CO2 23  --   GLUCOSE 139*  --   BUN 26*  --   CREATININE 1.34* 1.28*  CALCIUM 7.3*  --   MG 2.0  --   PHOS 3.6  --    GFR: Estimated Creatinine Clearance: 33.1 mL/min (A) (by C-G formula based on SCr of 1.28 mg/dL (H)). Liver Function Tests: Recent Labs  Lab 03/23/20 1137  AST 35  ALT 15  ALKPHOS 281*  BILITOT 0.8  PROT 5.3*  ALBUMIN 1.5*   No results for input(s): LIPASE, AMYLASE in the last 168 hours. No results for input(s): AMMONIA in the last 168 hours. Coagulation Profile: No results for input(s): INR, PROTIME in the last 168 hours. Cardiac Enzymes: No results for input(s): CKTOTAL, CKMB, CKMBINDEX, TROPONINI in the last 168 hours. BNP (last 3 results)  No results for input(s): PROBNP in the last 8760 hours. HbA1C: No results for input(s): HGBA1C in the last 72 hours. CBG: Recent Labs  Lab 03/27/20 1135 03/27/20 1630 03/27/20 2146 03/28/20 0756 03/28/20 1151  GLUCAP 112* 151* 124* 116* 134*   Lipid Profile: No results for input(s): CHOL, HDL, LDLCALC, TRIG, CHOLHDL, LDLDIRECT in the last 72 hours. Thyroid Function Tests: No results for input(s): TSH, T4TOTAL, FREET4, T3FREE, THYROIDAB in the last 72 hours. Anemia Panel: No results for input(s): VITAMINB12, FOLATE, FERRITIN, TIBC, IRON, RETICCTPCT in the last 72 hours. Sepsis Labs: No results for input(s): PROCALCITON, LATICACIDVEN in the last 168 hours.  No results found for this or any previous visit (from the past 240 hour(s)).       Radiology Studies: No results found.      Scheduled Meds: . calcitonin (salmon)  1 spray Alternating Nares Daily  . Chlorhexidine Gluconate Cloth  6 each Topical Daily  . famotidine  20 mg Oral BID  . feeding supplement (GLUCERNA SHAKE)  237 mL Oral TID BM  . insulin aspart  0-5 Units Subcutaneous QHS  . insulin aspart  0-9 Units Subcutaneous TID WC  . insulin glargine  5 Units Subcutaneous QHS  . LORazepam  1 mg Intravenous Once  . multivitamin with minerals  1 tablet Oral Daily  . sodium chloride flush  3 mL Intravenous Q12H  . tamsulosin  0.4 mg Oral Daily   Continuous Infusions: . sodium chloride    . methocarbamol (ROBAXIN) IV       LOS: 25 days    Time spent: 20 minutes    Barb Merino, MD [facility] Pager [phone]

## 2020-03-28 NOTE — Plan of Care (Signed)
  Problem: Activity: Goal: Ability to ambulate and perform ADLs will improve Outcome: Progressing   Problem: Clinical Measurements: Goal: Postoperative complications will be avoided or minimized Outcome: Progressing   Problem: Self-Concept: Goal: Ability to maintain and perform role responsibilities to the fullest extent possible will improve Outcome: Progressing   Problem: Pain Management: Goal: Pain level will decrease Outcome: Progressing   

## 2020-03-28 NOTE — Progress Notes (Signed)
Nutrition Follow-up  DOCUMENTATION CODES:   Obesity unspecified  INTERVENTION:   -Continue Glucerna Shake po TID, each supplement provides 220 kcal and 10 grams of protein -Continue MVI with minerals daily -Continue Magic cup TID with meals, each supplement provides 290 kcal and 9 grams of protein  NUTRITION DIAGNOSIS:   Increased nutrient needs related to wound healing as evidenced by estimated needs.  Ongoing  GOAL:   Patient will meet greater than or equal to 90% of their needs  Progressing   MONITOR:   PO intake, Supplement acceptance, Labs, Weight trends, Skin, I & O's  REASON FOR ASSESSMENT:   Consult Assessment of nutrition requirement/status  ASSESSMENT:   Whitney [name] is a 85 y.o. female with medical history significant for insulin-dependent diabetes mellitus, chronic kidney disease stage III, and right hip fracture more than a month ago, now presenting to the emergency department with increasing right hip pain despite taking oxycodone and gabapentin at home. Patient previously lived in [place] but had since been in [place] where she suffered a right hip fracture more than a month ago.  She was seen in a hospital in [place] where she was treated with some pain medications and Lovenox for 4 flying to the [place].  She was briefly admitted to a hospital in [place] several days ago where she was seen by orthopedic surgery and discharged home with plans for outpatient follow-up.  She has since come to [place], continues to use oxycodone and gabapentin, but has been experiencing increasing pain in the right hip.  She denied any fevers, chills, chest pain, cough, or shortness of breath.  Reviewed I/O's: -300 ml x 24 hours and -4.3 L since 03/14/20  UOP: 300 ml x 24 hours  Per orthopedics notes, pt with left closed distal metaphyseoepiphyseal fracture,angulated minimally displace; plan to treat non-operatively with a splint.   Intake has been variable,  but improved; noted meal completion 25-100%. Pt is refusing most Glucerna supplements.   Per therapy notes, pt daughter is refusing PT services for pt; desire pt to be comfortable and avoid additional fractures.   Per [facility] team notes, pt is a difficult placement for SNF. Daughter plans to move to a first floor level apartment to accommodate pt's needs.Pt will also be supported by home health services after hospital discharge.    Labs reviewed: CBGS: 110-164 (inpatient orders for glycemic control are 0-5 units insulin aspart q HS, 0-9 units insulin aspart TID with meals, and 5 units insulin glargine q HS).   Diet Order:   Diet Order            Diet Carb Modified Fluid consistency: Thin; Room service appropriate? No  Diet effective now              EDUCATION NEEDS:   No education needs have been identified at this time  Skin:  Skin Assessment: Skin Integrity Issues: Skin Integrity Issues:: Incisions Incisions: bilateral hips, rt leg Other: bullae present on BLE; partial thickness area to lt buttocks; hyperpignemtation to sacrum  Last BM:  03/27/20  Height:   Ht Readings from Last 1 Encounters:  03/03/20 5\' 2"  (1.575 m)    Weight:   Wt Readings from Last 1 Encounters:  03/03/20 79.7 kg    Ideal Body Weight:  50 kg  BMI:  Body mass index is 32.14 kg/m.  Estimated Nutritional Needs:   Kcal:  1500-1700  Protein:  80-95 grams  Fluid:  > 1.5 L    Aaliya Maultsby  W, RD, LDN, [place] Registered Dietitian II Certified Diabetes Care and Education Specialist Please refer to [facility] for RD and/or RD on-call/weekend/after hours pager

## 2020-03-29 LAB — GLUCOSE, CAPILLARY
Glucose-Capillary: 121 mg/dL — ABNORMAL HIGH (ref 70–99)
Glucose-Capillary: 128 mg/dL — ABNORMAL HIGH (ref 70–99)
Glucose-Capillary: 89 mg/dL (ref 70–99)
Glucose-Capillary: 127 mg/dL — ABNORMAL HIGH (ref 70–99)
Glucose-Capillary: 119 mg/dL — ABNORMAL HIGH (ref 70–99)
Glucose-Capillary: 128 mg/dL — ABNORMAL HIGH (ref 70–99)

## 2020-03-29 NOTE — Progress Notes (Signed)
Progress Note    Whitney [name]  MLY:[id number] DOB: [birth year]  DOA: 03/02/2020 PCP: Patient, No Pcp Per    Brief Narrative:    Medical records reviewed and are as summarized below:  Whitney [name] is an 85 y.o. female with medical history significant for insulin-dependent diabetes mellitus, chronic kidney disease stage III, and right hip fracture more than a month ago, now presenting to the emergency department with increasing right hip pain despite taking oxycodone and gabapentin at home.  Patient is accompanied by her daughter who assists with the history.  Patient previously lived in [place] but had since been in [place] where she suffered a right hip fracture more than a month ago.  She was seen in a hospital in [place] where she was treated with some pain medications and Lovenox for flying to the [place].  She was briefly admitted to a hospital in [place] several days ago where she was seen by orthopedic surgery and discharged home with plans for outpatient follow-up.  She has since come to [place], continues to use oxycodone and gabapentin, but has been experiencing increasing pain in the right hip.    Assessment/Plan:   Principal Problem:   Intractable pain Active Problems:   Diabetes mellitus without complication (HCC)   CKD (chronic kidney disease), stage III   Closed right hip fracture (HCC)   Protein calorie malnutrition (HCC)   Bilateral leg edema   Closed right hip fracture, initial encounter (HCC)   Dehydration   Pain   Goals of care, counseling/discussion   Advanced care planning/counseling discussion   Palliative care by specialist   Multiple spontaneous osteoporotic fractures in a patient with bedbound and advanced deconditioning: -Multiple fractures on conservative management.   -pain medications.   -Severe comorbidities and high mortality in near future.   -Palliative care consultation and follow-up.  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  Constipation: Resolved.     obesity Body mass index is 32.14 kg/m.   Family Communication/Anticipated D/C date and plan/Code Status   DVT prophylaxis: Lovenox ordered. Code Status: DNR  Disposition Plan: Status is: Inpatient  Remains inpatient appropriate because:Unsafe d/c plan   Dispo: The patient is from: Home              Anticipated d/c is to: Home              Anticipated d/c date is: 1 day              Patient currently is medically stable to d/c.          Medical Consultants:    Ortho  Palliative care  urology  Subjective:   No overnight events  Objective:    Vitals:   03/28/20 1430 03/28/20 2017 03/29/20 0303 03/29/20 0814  BP: 102/89 (!) 125/54 (!) 117/50 (!) 116/49  Pulse: 74 75 77 79  Resp: 17 15 15 18   Temp: (!) 97.4 F (36.3 C) 98.4 F (36.9 C) 98.4 F (36.9 C) 98 F (36.7 C)  TempSrc: Oral Oral Oral   SpO2: 95% 100% 94% 96%  Weight:      Height:        Intake/Output Summary (Last 24 hours) at 03/29/2020 1032 Last data filed at 03/28/2020 1500 Gross per 24 hour  Intake 123 ml  Output --  Net 123 ml   [employer]  03/03/20 0421 03/03/20 0838  Weight: 79.7 kg 79.7 kg    Exam: In bed, covers over face  Data Reviewed:   I have personally reviewed following labs and imaging studies:  Labs: Labs show the following:   Basic Metabolic Panel: Recent Labs  Lab 03/23/20 1137 03/26/20 0704  NA 143  --   K 4.6  --   CL 114*  --   CO2 23  --   GLUCOSE 139*  --   BUN 26*  --   CREATININE 1.34* 1.28*  CALCIUM 7.3*  --   MG 2.0  --   PHOS 3.6  --    GFR Estimated Creatinine Clearance: 33.1 mL/min (A) (by C-G formula based on SCr of 1.28 mg/dL (H)). Liver Function Tests: Recent Labs  Lab 03/23/20 1137  AST 35  ALT 15  ALKPHOS 281*  BILITOT 0.8  PROT 5.3*  ALBUMIN 1.5*   No results  for input(s): LIPASE, AMYLASE in the last 168 hours. No results for input(s): AMMONIA in the last 168 hours. Coagulation profile No results for input(s): INR, PROTIME in the last 168 hours.  CBC: Recent Labs  Lab 03/23/20 1137  WBC 4.7  NEUTROABS 2.4  HGB 10.0*  HCT 33.0*  MCV 102.8*  PLT 257   Cardiac Enzymes: No results for input(s): CKTOTAL, CKMB, CKMBINDEX, TROPONINI in the last 168 hours. BNP (last 3 results) No results for input(s): PROBNP in the last 8760 hours. CBG: Recent Labs  Lab 03/28/20 0756 03/28/20 1151 03/28/20 1641 03/28/20 2045 03/29/20 0811  GLUCAP 116* 134* 126* 121* 128*   D-Dimer: No results for input(s): DDIMER in the last 72 hours. Hgb A1c: No results for input(s): HGBA1C in the last 72 hours. Lipid Profile: No results for input(s): CHOL, HDL, LDLCALC, TRIG, CHOLHDL, LDLDIRECT in the last 72 hours. Thyroid function studies: No results for input(s): TSH, T4TOTAL, T3FREE, THYROIDAB in the last 72 hours.  Invalid input(s): FREET3 Anemia work up: No results for input(s): VITAMINB12, FOLATE, FERRITIN, TIBC, IRON, RETICCTPCT in the last 72 hours. Sepsis Labs: Recent Labs  Lab 03/23/20 1137  WBC 4.7    Microbiology No results found for this or any previous visit (from the past 240 hour(s)).  Procedures and diagnostic studies:  No results found.  Medications:   . calcitonin (salmon)  1 spray Alternating Nares Daily  . Chlorhexidine Gluconate Cloth  6 each Topical Daily  . famotidine  20 mg Oral BID  . feeding supplement (GLUCERNA SHAKE)  237 mL Oral TID BM  . insulin aspart  0-5 Units Subcutaneous QHS  . insulin aspart  0-9 Units Subcutaneous TID WC  . insulin glargine  5 Units Subcutaneous QHS  . LORazepam  1 mg Intravenous Once  . multivitamin with minerals  1 tablet Oral Daily  . sodium chloride flush  3 mL Intravenous Q12H  . tamsulosin  0.4 mg Oral Daily   Continuous Infusions: . sodium chloride    . methocarbamol (ROBAXIN)  IV       LOS: 26 days   Geradine Girt  [facility]   How to contact the [facility] Attending or Consulting provider [place] or covering provider during after hours [place], for this patient?  1. Check the care team in [facility] and look for a) attending/consulting TRH provider listed and b) the [facility] team listed 2. Log into [email or web address] and use [place]'s universal password to access. If you do not have the password, please contact the hospital operator.  3. Locate the [facility] provider you are looking for under [facility] and page to a number that you can be directly reached. 4. If you still have difficulty reaching the provider, please page the [facility] (Director on Call) for the Hospitalists listed on amion for assistance.  03/29/2020, 10:32 AM

## 2020-03-30 ENCOUNTER — Ambulatory Visit: Payer: 59 | Attending: Family Medicine

## 2020-03-30 LAB — GLUCOSE, CAPILLARY
Glucose-Capillary: 134 mg/dL — ABNORMAL HIGH (ref 70–99)
Glucose-Capillary: 125 mg/dL — ABNORMAL HIGH (ref 70–99)
Glucose-Capillary: 103 mg/dL — ABNORMAL HIGH (ref 70–99)
Glucose-Capillary: 112 mg/dL — ABNORMAL HIGH (ref 70–99)

## 2020-03-30 MED ORDER — INSULIN GLARGINE 100 UNIT/ML SOLOSTAR PEN: 5.0000 [IU] | PEN_INJECTOR | Freq: Every day | SUBCUTANEOUS | 0 refills | Status: DC

## 2020-03-30 MED ORDER — INSULIN GLARGINE 100 UNIT/ML SOLOSTAR PEN: 5.0000 [IU] | PEN_INJECTOR | Freq: Every day | SUBCUTANEOUS | 11 refills | Status: DC

## 2020-03-30 MED ORDER — VITAMIN D (ERGOCALCIFEROL) 1.25 MG (50000 UNIT) PO CAPS: 50000.0000 [IU] | ORAL_CAPSULE | ORAL | 0 refills | Status: AC

## 2020-03-30 MED ORDER — FAMOTIDINE 20 MG PO TABS: 20.0000 mg | ORAL_TABLET | Freq: Two times a day (BID) | ORAL | 0 refills | Status: AC

## 2020-03-30 MED ORDER — CALCITONIN (SALMON) 200 UNIT/ACT NA SOLN: 1.0000 | Freq: Every day | NASAL | 0 refills | Status: AC

## 2020-03-30 NOTE — Progress Notes (Signed)
Patient discharged in stable condition and transported to home with [facility] transporters.

## 2020-03-30 NOTE — Care Management Note (Signed)
Case Management Note  Patient Details  Name: Whitney [name] MRN: [id number] Date of Birth: Jun 24, 1935  Subjective/Objective:                    Action/Plan: Case Management spoke with Joen Laura, RNCM with [facility] and confirmed that [facility] would provide an initial assessment through PT in the next 24-48 hours of the patient's discharge home.  The patient's daughter has had an available first floor apartment available to her for move according to the [place] apartment complex for the past 2 or more weeks.  The daughter stated that she has members from the [facility] available to help her move - but that she has chosen to remain in her current apartment.  [facility] is aware and will make their assessment and continue with the treatment plan if able.  I explained to the daughter in great length that the [place] and [facility] were concerned about the safety of her choosing to live on the upper level apartment.  I explained to the daughter that it is in [facility] wishes to assess for all safety issues with their first visit.  The daughter is aware.  The patient will have access to PT/OT/aide/ MSW through [facility] at this point.  Joen Laura also relayed that she will consult the Palliative Care RN at [facility] to assess for services if able.  See all other discharge notes.  Expected Discharge Date:  03/30/20               Expected Discharge Plan:  [place]  In-House Referral:     Discharge planning Services  CM Consult  Post Acute Care Choice:  Durable Medical Equipment, Home Health Choice offered to:  [place] / [facility]  DME Arranged:  3-N-1, Hospital bed, Other see comment, Trapeze(Hoyer lift) DME Agency:  [facility]  HH Arranged:  PT, OT HH Agency:  [facility]  Status of Service:     If discussed at [employer], dates discussed:    Additional  Comments:  Curlene Labrum, RN 03/30/2020, 2:28 PM

## 2020-03-30 NOTE — TOC Transition Note (Addendum)
Transition of Care [facility]) - CM/SW Discharge Note   Patient Details  Name: Desta Bujak MRN: [id number] Date of Birth: Nov 29, 1934  Transition of [facility]) CM/SW Contact:  Curlene Labrum, RN Phone Number: 03/30/2020, 11:51 AM   Clinical Narrative:    Case management spoke with the daughter, Milta Deiters, on the phone last evening at 1650 to discuss patient transition to home.  The daughter stated that she received medicaid after an appointment with the Medicaid office yesterday.  Plans were made to meet the daughter, Milta Deiters, in the patient's room today at 43 but the daughter has not arrived to the hospital.  I called [place] and scheduled the patient's hospital follow up appointment.  I called and left a voice message with Milta Deiters on her cell phone regarding the patient's discharge today and need to arrange ambulance transport to her home/apartment today.  Will followup with the daughter for discharge needs and transport.  03/30/2020 - Spoke with the daughter, Milta Deiters in the room and daughter agreeable to discharge today.  Daughter given resources regarding [facility] transportation for appointments and flyer for [employer] and [employer].  [facility] to followup for home health services.  [employer] delivered equipment to the home on Monday, May 03/27/2020.  Discharge instructions to be given by the nurse prior to the daughter leaving today.  I scheduled [facility] to transport the patient today at 1530 so that she arrives at the confirmed address at [place], [place], [place].   Final next level of care: Home w Home Health Services Barriers to Discharge: Family Issues   Patient Goals and CMS Choice Patient states their goals for this hospitalization and ongoing recovery are:: Patient lives with her daughter and was flown to the [place] to have surgery. CMS Medicare.gov Compare Post Acute Care list provided to:: Patient  Represenative (must comment)([name], daughter) Choice offered to / list presented to : [place] / [place]  Discharge Placement                       Discharge Plan and Services   Discharge Planning Services: CM Consult Post Acute Care Choice: Durable Medical Equipment, Home Health          DME Arranged: 3-N-1, Hospital bed, Other see comment, Trapeze(Hoyer lift) DME Agency: [facility] Date DME Agency Contacted: 03/06/20 Time DME Agency Contacted: [phone] Representative spoke with at DME Agency: [place]: PT, OT [place] Agency: [facility] [employer] Date [place]: 03/06/20 Time [place]: 1548 Representative spoke with at [place]: [place] ([place]) Interventions     Readmission Risk Interventions No flowsheet data found.

## 2020-03-30 NOTE — Discharge Summary (Signed)
Physician Discharge Summary  Whitney [name] EXB:[id number] DOB: 04-23-1935 DOA: 03/02/2020  PCP: Patient, No Pcp [facility]  Admit date: 03/02/2020 Discharge date: 03/30/2020  Admitted From: Home Discharge disposition: Home with 24/7 care by family   Recommendations for Outpatient Follow-Up:   1. Follow vitamin D levels 2. Outpatient palliative care referral 3. DME equipment 4. Cbc, bmp 1 week   Discharge Diagnosis:   Principal Problem:   Intractable pain Active Problems:   Diabetes mellitus without complication (HCC)   CKD (chronic kidney disease), stage III   Closed right hip fracture (HCC)   Protein calorie malnutrition (HCC)   Bilateral leg edema   Closed right hip fracture, initial encounter (HCC)   Dehydration   Pain   Goals of care, counseling/discussion   Advanced care planning/counseling discussion   Palliative care by specialist    Discharge Condition: Stable.  Diet recommendation:  Carbohydrate-modified.   Wound care: None.  Code status: DNR   History of Present Illness:   Whitney [name] is an 85 y.o. female with medical history significant forinsulin-dependent diabetes mellitus, chronic kidney disease stage III, and right hip fracture more than a month ago, now presenting to the emergency department with increasing right hip pain despite taking oxycodone and gabapentin at home. Patient is accompanied by her daughter who assists with the history. Patient previously lived in [place] but had since been in [place] where she suffered a right hip fracture more than a month ago. She was seen in a hospital in [place] where she was treated with some pain medications and Lovenox for flying to the [place]. She was briefly admitted to a hospital in [place] several days ago where she was seen by orthopedic surgery and discharged home with plans for outpatient follow-up. She has since come to [place], continues to use oxycodone and gabapentin, but has been experiencing increasing pain in the right hip.    Hospital Course by Problem:   Multiple spontaneous osteoporotic fractures in a patient with bedbound and advanced deconditioning: -Multiple fractures on conservative management:  -Mildly displaced oblique fractures of the distal tibia and fibula: Ortho reconsulted recommends nonsurgical management as patient is nonweightbearing and non ambulatory and severe osteoporosis -pain medications.  -Severe comorbidities and high mortality in near future.  -Palliative care consultation and follow-up.  Acute urinary retention:Probably due to immobility and fractures -resolved  acute metabolic encephalopathy-resolved.  Normal ammonia normal B12 and TSH. CT of the head shows chronic small vessel disease without any acute abnormality. Gabapentin was stopped.   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    CKD stage IIIb at baseline  Constipation: Resolved.    obesity Body mass index is 32.14 kg/m.  Severe osteoporosis -Started on nasal spray -Vitamin D level is low so started on replacement weekly for 8 weeks and then will need to be reassessed by PCP  Medical Consultants:   Orthopedics   Discharge Exam:   Vitals:   03/30/20 0608 03/30/20 0807  BP: (!) 133/56 (!) 129/51  Pulse: 79 71  Resp: 15 17  Temp: 97.7 F (36.5 C) 98.8 F (37.1 C)  SpO2: 100% 100%   Vitals:   03/29/20 1700 03/29/20 2000 03/30/20 0608 03/30/20 0807  BP: (!) 130/45 (!) 128/51 (!) 133/56 (!) 129/51  Pulse: 70 82 79 71  Resp: 18 16 15 17   Temp: 98.6  F (37 C) (!) 97.5 F (36.4 C) 97.7 F (36.5 C) 98.8 F (37.1 C)  TempSrc: Oral Oral Oral Oral  SpO2: 95% 93% 100% 100%  Weight:      Height:        General exam: Covers over her head, no acute distress   The results of  significant diagnostics from this hospitalization (including imaging, microbiology, ancillary and laboratory) are listed below for reference.     Procedures and Diagnostic Studies:   CT HIP RIGHT WO CONTRAST  Result Date: 03/03/2020 CLINICAL DATA:  Hip fracture 1 month prior EXAM: CT OF THE RIGHT HIP WITHOUT CONTRAST TECHNIQUE: Multidetector CT imaging of the right hip was performed according to the standard protocol. Multiplanar CT image reconstructions were also generated. COMPARISON:  Radiograph 03/02/2020 FINDINGS: Bones/Joint/Cartilage Further demonstration of the impacted transcervical femoral neck fracture with marked varus angulation seen on comparison radiographs. There is a subacute appearance to the fracture margins with marked osteopenia and sclerotic changes without features of callus formation or bony bridging to suggest even and early healing response. There is minimal distension the joint capsule with some soft tissue thickening and adjacent stranding suggesting at least mild synovitis. The femoral head remains normally located within the acetabulum. Moderate underlying hip arthrosis is present. There is soft tissue thickening and chronic appearing bony destructive changes at the symphysis pubis. Further thickening and erosive changes noted at the right SI joint. Age indeterminate bilateral sacral insufficiency fractures are noted as well. Exuberant enthesophytes are noted upon the greater trochanter and lesser trochanter as well as the ischial tuberosity. Ligaments Suboptimally assessed by CT. Muscles and Tendons There is diffuse atrophy of the proximal thigh musculature. No clear intramuscular hemorrhage or abscess is seen. No retracted torn tendons are evident. Soft tissues Circumferential soft tissue swelling of the right hip, may reflect a combination of edematous change and inflammation. Synovial changes of the right hip joint as above. There is focal soft tissue thickening posterior to  the left ilium, with some irregular attenuation and soft tissue mineralization. Possibly posttraumatic or surgical in nature such as sequela of fat necrosis, though overall indeterminate. Included portions of the abdomen and pelvis demonstrate a large rectal stool ball with some mild perirectal fat stranding, which could reflect a mild stercoral colitis. Dense vascular calcifications noted in the uterus. IMPRESSION: 1. Impacted transcervical femoral neck fracture with marked varus angulation. There is a subacute appearance to the fracture margins with marked osteopenia and sclerotic changes. Mild right hip effusion and synovitis noted as well. 2. Moderate underlying hip arthrosis. 3. Soft tissue thickening and chronic appearing bony destructive changes at the symphysis pubis and right SI joint. 4. Age indeterminate bilateral sacral insufficiency fractures. 5. Large rectal stool ball with some mild perirectal fat stranding, which could reflect a mild stercoral colitis. 6. Focal soft tissue thickening posterior to the left ilium, with some irregular attenuation and soft tissue mineralization. Possibly posttraumatic or postsurgical changes sequela of fat necrosis. 7. Diffuse atrophy of the proximal thigh musculature. Electronically Signed   By: Lovena Le M.D.   On: 03/03/2020 18:08   ECHOCARDIOGRAM COMPLETE  Result Date: 03/03/2020    ECHOCARDIOGRAM REPORT   Patient Name:   Whitney [name] Date of Exam: 03/03/2020 Medical Rec #:  [id number]    Height:       62.0 in Accession #:    [id number]   Weight:       175.7 lb Date of Birth:  1934-12-24    BSA:  1.809 m Patient Age:    67 years     BP:           125/73 mmHg Patient Gender: F            HR:           72 bpm. Exam Location:  Inpatient Procedure: 2D Echo Indications:    Congestive Heart Failure  History:        Patient has no prior history of Echocardiogram examinations.                 Risk Factors:Diabetes.  Sonographer:    Mikki Santee RDCS (AE)  Referring Phys: [id number] [place]  1. Abnormal septal motion . Left ventricular ejection fraction, by estimation, is 55 to 60%. The left ventricle has normal function. The left ventricle has no regional wall motion abnormalities. Left ventricular diastolic parameters were normal.  2. Right ventricular systolic function is normal. The right ventricular size is normal.  3. The mitral valve is degenerative. Mild to moderate mitral valve regurgitation. No evidence of mitral stenosis.  4. The aortic valve is tricuspid. Aortic valve regurgitation is not visualized. Mild to moderate aortic valve sclerosis/calcification is present, without any evidence of aortic stenosis.  5. The inferior vena cava is normal in size with greater than 50% respiratory variability, suggesting right atrial pressure of 3 mmHg. FINDINGS  Left Ventricle: Abnormal septal motion. Left ventricular ejection fraction, by estimation, is 55 to 60%. The left ventricle has normal function. The left ventricle has no regional wall motion abnormalities. The left ventricular internal cavity size was normal in size. There is no left ventricular hypertrophy. Left ventricular diastolic parameters were normal. Right Ventricle: The right ventricular size is normal. No increase in right ventricular wall thickness. Right ventricular systolic function is normal. Left Atrium: Left atrial size was normal in size. Right Atrium: Right atrial size was normal in size. Pericardium: There is no evidence of pericardial effusion. Mitral Valve: The mitral valve is degenerative in appearance. There is moderate thickening of the mitral valve leaflet(s). There is moderate calcification of the mitral valve leaflet(s). Normal mobility of the mitral valve leaflets. Severe mitral annular  calcification. Mild to moderate mitral valve regurgitation. No evidence of mitral valve stenosis. Tricuspid Valve: The tricuspid valve is normal in structure. Tricuspid valve  regurgitation is not demonstrated. No evidence of tricuspid stenosis. Aortic Valve: The aortic valve is tricuspid. Aortic valve regurgitation is not visualized. Mild to moderate aortic valve sclerosis/calcification is present, without any evidence of aortic stenosis. Pulmonic Valve: The pulmonic valve was normal in structure. Pulmonic valve regurgitation is not visualized. No evidence of pulmonic stenosis. Aorta: The aortic root is normal in size and structure. Venous: The inferior vena cava is normal in size with greater than 50% respiratory variability, suggesting right atrial pressure of 3 mmHg. IAS/Shunts: The interatrial septum was not well visualized.  LEFT VENTRICLE PLAX 2D LVIDd:         3.80 cm  Diastology LVIDs:         3.20 cm  LV e' lateral:   7.27 cm/s LV PW:         1.10 cm  LV E/e' lateral: 14.3 LV IVS:        0.90 cm  LV e' medial:    3.94 cm/s LVOT diam:     2.00 cm  LV E/e' medial:  26.4 LV SV:         69 LV SV Index:  38 LVOT Area:     3.14 cm  RIGHT VENTRICLE RV S prime:     7.71 cm/s TAPSE (M-mode): 1.7 cm LEFT ATRIUM             Index       RIGHT ATRIUM           Index LA diam:        3.00 cm 1.66 cm/m  RA Area:     10.80 cm LA Vol (A2C):   40.0 ml 22.11 ml/m RA Volume:   18.60 ml  10.28 ml/m LA Vol (A4C):   36.6 ml 20.23 ml/m LA Biplane Vol: 39.3 ml 21.72 ml/m  AORTIC VALVE LVOT Vmax:   80.80 cm/s LVOT Vmean:  58.400 cm/s LVOT VTI:    0.221 m  AORTA Ao Root diam: 3.40 cm MITRAL VALVE MV Area (PHT): 3.77 cm     SHUNTS MV Decel Time: 201 msec     Systemic VTI:  0.22 m MV E velocity: 104.00 cm/s  Systemic Diam: 2.00 cm MV A velocity: 81.50 cm/s MV E/A ratio:  1.28 Whitney Rouge MD Electronically signed by Whitney Rouge MD Signature Date/Time: 03/03/2020/4:34:25 PM    Final    DG Hip Unilat  With Pelvis 2-3 Views Right  Result Date: 03/02/2020 CLINICAL DATA:  Right hip pain EXAM: DG HIP (WITH OR WITHOUT PELVIS) 2-3V RIGHT COMPARISON:  None. FINDINGS: There is a right femoral neck  fracture with varus angulation. No subluxation or dislocation. Severe diffuse osteopenia. Prior left hip replacement. IMPRESSION: Right femoral neck fracture with varus angulation. Severe osteopenia. Electronically Signed   By: Rolm Baptise M.D.   On: 03/02/2020 19:45     Labs:   Basic Metabolic Panel: Recent Labs  Lab 03/23/20 1137 03/26/20 0704  NA 143  --   K 4.6  --   CL 114*  --   CO2 23  --   GLUCOSE 139*  --   BUN 26*  --   CREATININE 1.34* 1.28*  CALCIUM 7.3*  --   MG 2.0  --   PHOS 3.6  --    GFR Estimated Creatinine Clearance: 33.1 mL/min (A) (by C-G formula based on SCr of 1.28 mg/dL (H)). Liver Function Tests: Recent Labs  Lab 03/23/20 1137  AST 35  ALT 15  ALKPHOS 281*  BILITOT 0.8  PROT 5.3*  ALBUMIN 1.5*   No results for input(s): LIPASE, AMYLASE in the last 168 hours. No results for input(s): AMMONIA in the last 168 hours. Coagulation profile No results for input(s): INR, PROTIME in the last 168 hours.  CBC: Recent Labs  Lab 03/23/20 1137  WBC 4.7  NEUTROABS 2.4  HGB 10.0*  HCT 33.0*  MCV 102.8*  PLT 257   Cardiac Enzymes: No results for input(s): CKTOTAL, CKMB, CKMBINDEX, TROPONINI in the last 168 hours. BNP: Invalid input(s): POCBNP CBG: Recent Labs  Lab 03/29/20 0811 03/29/20 1139 03/29/20 1525 03/29/20 1708 03/29/20 2108  GLUCAP 128* 89 127* 119* 128*   D-Dimer No results for input(s): DDIMER in the last 72 hours. Hgb A1c No results for input(s): HGBA1C in the last 72 hours. Lipid Profile No results for input(s): CHOL, HDL, LDLCALC, TRIG, CHOLHDL, LDLDIRECT in the last 72 hours. Thyroid function studies No results for input(s): TSH, T4TOTAL, T3FREE, THYROIDAB in the last 72 hours.  Invalid input(s): FREET3 Anemia work up No results for input(s): VITAMINB12, FOLATE, FERRITIN, TIBC, IRON, RETICCTPCT in the last 72 hours. Microbiology No results found for this  or any previous visit (from the past 240  hour(s)).   Discharge Instructions:   Discharge Instructions    Diet Carb Modified   Complete by: As directed    Discharge instructions   Complete by: As directed    Outpatient palliative care follow up/referral Orthopedics follow up   Increase activity slowly   Complete by: As directed    Non weight bearing   Complete by: As directed      Allergies as of 03/30/2020   No Known Allergies     Medication List    STOP taking these medications   enoxaparin 40 MG/0.4ML injection Commonly known as: LOVENOX   gabapentin 100 MG capsule Commonly known as: NEURONTIN   insulin lispro 100 UNIT/ML injection Commonly known as: HUMALOG   oxyCODONE 5 MG immediate release tablet Commonly known as: Oxy IR/ROXICODONE     TAKE these medications   acetaminophen 500 MG tablet Commonly known as: TYLENOL Take 2 tablets by mouth every 8 (eight) hours as needed for moderate pain.   calcitonin (salmon) 200 UNIT/ACT nasal spray Commonly known as: MIACALCIN/FORTICAL Place 1 spray into alternate nostrils daily.   famotidine 20 MG tablet Commonly known as: PEPCID Take 1 tablet (20 mg total) by mouth 2 (two) times daily.   insulin glargine 100 UNIT/ML Solostar Pen Commonly known as: LANTUS Inject 5 Units into the skin at bedtime. What changed: how much to take   oxyCODONE-acetaminophen 5-325 MG tablet Commonly known as: Percocet Take 1-2 tablets by mouth every 8 (eight) hours as needed for severe pain.   Vitamin D (Ergocalciferol) 1.25 MG (50000 UNIT) Caps capsule Commonly known as: DRISDOL Take 1 capsule (50,000 Units total) by mouth every 7 (seven) days.            Durable Medical Equipment  (From admission, onward)         Start     Ordered   03/06/20 1648  For home use only DME lightweight manual wheelchair with seat cushion  Once    Comments: Patient suffers from S/P fracture of femoral head which impairs their ability to perform daily activities like [id number] in the  home.  A walking [id number] will not resolve  issue with performing activities of daily living. A wheelchair will allow patient to safely perform daily activities. Patient is not able to propel themselves in the home using a standard weight wheelchair due to weakness:20653. Patient can self propel in the lightweight wheelchair. Length of need Timeframe:22503. Accessories: elevating leg rests (ELRs), wheel locks, extensions and anti-tippers.   03/06/20 1648   03/06/20 1647  For home use only DME Hospital bed  Once    Question Answer Comment  Length of Need 6 Months   The above medical condition requires: Patient requires the ability to reposition frequently   Bed type Semi-electric   [facility] Lift Yes   Trapeze Bar Yes   Support Surface: Gel Overlay      03/06/20 1648   03/06/20 1646  For home use only DME 3 n 1  Once     03/06/20 1648           Discharge Care Instructions  (From admission, onward)         Start     Ordered   03/05/20 0000  Non weight bearing     03/05/20 0847         Follow-up Information    Leandrew Koyanagi, MD In 2 weeks.   Specialty: Orthopedic Surgery Why:  For suture removal, For wound re-check Contact information: [place] [address] [phone]        [facility]. Go to.   Why: Community health and wellness appointment on 5/20 at 1:50 pm. Contact information: [place] [address] [phone]       [facility] Follow up.   Specialty: Home Health Services Why: [facility] will be providing home health Physical therapy, occupational therapy, medical social worker, and aide to assist with care with the patient.  You should receive a call within 24-48 hours of discharge home to start care. Contact information: [address] [place] [address] [phone]        [facility] Follow up.   Why: [employer] equipment  was ordered and delivered to your apartment on 03/27/2020 at 1130 - including hospital bed, trapeze, hoyer lift, 3:1 commode, and wheelchair. Contact information: [address] [place] [address] [phone]            Time coordinating discharge: 45 minutes  Signed:  Geradine Girt DO  [facility] 03/30/2020, 8:24 AM

## 2020-03-30 NOTE — Progress Notes (Signed)
Patient discharging home. Discharge instructions explained to patient's daughter and she verbalized understanding. Called Dr Erlinda Hong, ortho concerning suture removal date, since patient has been in the hospital >2 weeks. Received order to remove sutures on Rt hip. Sutures removed, patient tolerated procedure, wound is clean and dry, no drainage, no s/s of infection noted. Steri-strips applied to wound. Daughter updated on same. Awaiting transportation.

## 2020-04-12 ENCOUNTER — Ambulatory Visit: Payer: 59 | Attending: Family Medicine | Admitting: Physician Assistant

## 2020-04-12 ENCOUNTER — Other Ambulatory Visit: Payer: Self-pay

## 2020-04-12 DIAGNOSIS — S72001S Fracture of unspecified part of neck of right femur, sequela: Secondary | ICD-10-CM

## 2020-04-12 DIAGNOSIS — Z09 Encounter for follow-up examination after completed treatment for conditions other than malignant neoplasm: Secondary | ICD-10-CM

## 2020-04-12 DIAGNOSIS — E119 Type 2 diabetes mellitus without complications: Secondary | ICD-10-CM

## 2020-04-12 DIAGNOSIS — Z794 Long term (current) use of insulin: Secondary | ICD-10-CM

## 2020-04-12 DIAGNOSIS — Z515 Encounter for palliative care: Secondary | ICD-10-CM

## 2020-04-12 DIAGNOSIS — N1831 Chronic kidney disease, stage 3a: Secondary | ICD-10-CM

## 2020-04-12 MED ORDER — INSULIN GLARGINE 100 UNIT/ML SOLOSTAR PEN: 5.0000 [IU] | PEN_INJECTOR | Freq: Every day | SUBCUTANEOUS | 5 refills | Status: AC

## 2020-04-12 NOTE — Progress Notes (Signed)
Patient ID: Whitney [name], female   DOB: 11/01/1935, 85 y.o.   MRN: [id number]  Virtual Visit via Telephone Note  I connected with [name] on 04/12/20 at  3:50 PM EDT by telephone and verified that I am speaking with the correct person using two identifiers.   I discussed the limitations, risks, security and privacy concerns of performing an evaluation and management service by telephone and the availability of in person appointments. I also discussed with the patient that there may be a patient responsible charge related to this service. The patient expressed understanding and agreed to proceed.  PATIENT visit by telephone virtually in the context of Covid-19 pandemic. Patient location:  home My Location:  [facility] office Persons on the call:  Me, the patient, and her daughter interpreting  History of Present Illness: After hospitalization 4/22-5/20/2021.  Patient is weak, unable to perform ADL.  Her daughter is caring for her.  Blood sugars running from 120-160.  The daughter is unable to leave the house with her.  She was supposed to have palliative care set up but the daughter says no one has come out.  She feels like she needs help.  She is overwhelmed with having to care for her mom 24/7.  She has to change her about 5 times daily.  Says she still has casts on her legs.  Unable to bring her in for labs or OV  From discharge summary: 1. Follow vitamin D levels 2. Outpatient palliative care referral 3. DME equipment 4. Cbc, bmp 1 week  Principal Problem:   Intractable pain Active Problems:   Diabetes mellitus without complication (HCC)   CKD (chronic kidney disease), stage III   Closed right hip fracture (HCC)   Protein calorie malnutrition (HCC)   Bilateral leg edema   Closed right hip fracture, initial encounter (HCC)   Dehydration   Pain   Goals of care, counseling/discussion   Advanced care planning/counseling discussion   Palliative care by specialist  Royetta Asal an  85 y.o.femalewith medical history significant forinsulin-dependent diabetes mellitus, chronic kidney disease stage III, and right hip fracture more than a month ago, now presenting to the emergency department with increasing right hip pain despite taking oxycodone and gabapentin at home. Patient is accompanied by her daughter who assists with the history. Patient previously lived in [place] but had since been in [place] where she suffered a right hip fracture more than a month ago. She was seen in a hospital in [place] where she was treated with some pain medications and Lovenox for flying to the [place]. She was briefly admitted to a hospital in [place] several days ago where she was seen by orthopedic surgery and discharged home with plans for outpatient follow-up. She has since come to [place], continues to use oxycodone and gabapentin, but has been experiencing increasing pain in the right hip.   Hospital Course by Problem:   Multiple spontaneous osteoporotic fractures in a patient with bedbound and advanced deconditioning: -Multiple fractures on conservative management:  -Mildly displaced oblique fractures of the distal tibia and fibula: Ortho reconsulted recommends nonsurgical management as patient is nonweightbearing and non ambulatory and severe osteoporosis -pain medications. -Severe comorbidities and high mortality in near future. -Palliative care consultation and follow-up.  Acute urinary retention:Probably due to immobility and fractures -resolved  acute metabolic encephalopathy-resolved. Normal ammonia normal B12 and TSH. CT of the head shows chronic small vessel disease without any acute abnormality. Gabapentin was stopped.   Type 2 diabetes  with CKD stage IIIa: -Resume insulin  hyperkalemia she received calcium gluconate insulin dextrose Lokelma and Kayexalate.    CKD stage IIIb at baseline  Constipation:  Resolved.  obesity Body mass index is 32.14 kg/m.  Severe osteoporosis -Started on nasal spray -Vitamin D level is low so started on replacement weekly for 8 weeks and then will need to be reassessed by PCP      Observations/Objective:  NAD.  A&Ox3   Assessment and Plan: 1. Diabetes mellitus without complication ([place]) Continue current dosing of lantus at 5 units at bedtime and keep checking CBG at least bid and bring to next visit or to be assessed by home health.    2. Closed fracture of right hip, sequela - Ambulatory referral to [facility] - Ambulatory referral to Social Work  3. Stage 3a chronic kidney disease  4. Hospital discharge follow-up - Ambulatory referral to [place] - Ambulatory referral to Social Work  5. Palliative care by specialist - Ambulatory referral to [facility] - Ambulatory referral to Social Work    Follow Up Instructions: Will try and get home health or mobile MD or palliative care set up.  Patient unable to come in currently   I discussed the assessment and treatment plan with the patient. The patient was provided an opportunity to ask questions and all were answered. The patient agreed with the plan and demonstrated an understanding of the instructions.   The patient was advised to call back or seek an in-person evaluation if the symptoms worsen or if the condition fails to improve as anticipated.  I provided 15 minutes of non-face-to-face time during this encounter.   Freeman Caldron, PA-C

## 2020-04-12 NOTE — Progress Notes (Signed)
Needs pen needles for lantus prescription

## 2020-04-13 ENCOUNTER — Telehealth: Payer: Self-pay

## 2020-04-13 NOTE — Telephone Encounter (Signed)
Call placed to [facility], spoke to [place] who stated that they never admitted the patient for services because the environment is not safe.

## 2020-04-26 ENCOUNTER — Other Ambulatory Visit: Payer: Self-pay | Admitting: Physician Assistant

## 2020-04-26 ENCOUNTER — Other Ambulatory Visit: Payer: Self-pay

## 2020-04-26 MED ORDER — PEN NEEDLES 30G X 5 MM MISC: 12 refills | Status: DC

## 2020-04-26 MED ORDER — PEN NEEDLES 30G X 5 MM MISC: 12 refills | Status: AC

## 2020-04-26 NOTE — Progress Notes (Signed)
Pt. Request to send pen needles to pharmacy.  RX sent.

## 2020-04-28 ENCOUNTER — Telehealth: Payer: Self-pay | Admitting: General Practice

## 2020-04-28 ENCOUNTER — Telehealth: Payer: Self-pay | Admitting: Licensed Clinical Social Worker

## 2020-04-28 NOTE — Telephone Encounter (Signed)
Call placed utilizing [employer] [phone] [place]) to inquire about resource needs. LCSW left message requesting a return call.

## 2020-04-28 NOTE — Telephone Encounter (Signed)
Patients daughter was returning LCSW call. Please follow up at your earliest convenience.

## 2020-05-08 ENCOUNTER — Telehealth: Payer: Self-pay

## 2020-05-08 ENCOUNTER — Telehealth: Payer: 59 | Admitting: Physician Assistant

## 2020-05-08 ENCOUNTER — Other Ambulatory Visit: Payer: Self-pay

## 2020-05-08 DIAGNOSIS — S72001S Fracture of unspecified part of neck of right femur, sequela: Secondary | ICD-10-CM

## 2020-05-08 NOTE — Progress Notes (Signed)
Established Patient Office Visit  Subjective:  Patient ID: Whitney [name], female    DOB: 03/31/1935  Age: 85 y.o. MRN: [id number]  CC:  Chief Complaint  Patient presents with  . Leg Pain    Virtual Visit via Telephone Note  I connected with Whitney [name] on 05/10/20 at  3:00 PM EDT by telephone and verified that I am speaking with the correct person using two identifiers. I spoke with daughter Milta Deiters who provided all patient information.  Location: Patient: At home Provider: Mobile Medicine Unit   I discussed the limitations, risks, security and privacy concerns of performing an evaluation and management service by telephone and the availability of in person appointments. I also discussed with the patient that there may be a patient responsible charge related to this service. The patient expressed understanding and agreed to proceed.   History of Present Illness: Patient has been waiting to be approved for home health without relief.  [facility] refused to accept her as a client due to patient "living on the third floor of apartment building"  Daughter states that patient is unable to perform any ADL's and requires 24/7 care.  States that she is unable to do this care on her own due to her own health issues.  Was hospitalized from 03/02/20 - 03/30/20 Hospital course:   Reva Pinkley an 85 y.o.femalewith medical history significant forinsulin-dependent diabetes mellitus, chronic kidney disease stage III, and right hip fracture more than a month ago, now presenting to the emergency department with increasing right hip pain despite taking oxycodone and gabapentin at home. Patient is accompanied by her daughter who assists with the history. Patient previously lived in [place] but had since been in [place] where she suffered a right hip fracture more than a month ago. She was seen in a hospital in [place] where she was treated with some pain medications and Lovenox for  flying to the [place]. She was briefly admitted to a hospital in [place] several days ago where she was seen by orthopedic surgery and discharged home with plans for outpatient follow-up. She has since come to [place], continues to use oxycodone and gabapentin, but has been experiencing increasing pain in the right hip.   Hospital Course by Problem:   Multiple spontaneous osteoporotic fractures in a patient with bedbound and advanced deconditioning: -Multiple fractures on conservative management:  -Mildly displaced oblique fractures of the distal tibia and fibula: Ortho reconsulted recommends nonsurgical management as patient is nonweightbearing and non ambulatory and severe osteoporosis -pain medications. -Severe comorbidities and high mortality in near future. -Palliative care consultation and follow-up.  Acute urinary retention:Probably due to immobility and fractures -resolved  acute metabolic encephalopathy-resolved. Normal ammonia normal B12 and TSH. CT of the head shows chronic small vessel disease without any acute abnormality. Gabapentin was stopped.   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    CKD stage IIIb at baseline  Constipation: Resolved.  obesity Body mass index is 32.14 kg/m.  Severe osteoporosis -Started on nasal spray -Vitamin D level is low so started on replacement weekly for 8 weeks and then will need to be reassessed by PCP     Observations/Objective:  Hospital records reviewed, no physical exam / ROS completed .    Past Medical History:  Diagnosis Date  . Diabetes mellitus without complication [facility])     Past Surgical History:  Procedure Laterality Date  .  TOTAL HIP ARTHROPLASTY Right 03/05/2020   Procedure: HIP GIRDLE STONE;  Surgeon: Leandrew Koyanagi, MD;  Location: [place];  Service: Orthopedics;  Laterality:  Right;    History reviewed. No pertinent family history.  Social History   Socioeconomic History  . Marital status: Widowed    Spouse name: Not on file  . Number of children: Not on file  . Years of education: Not on file  . Highest education level: Not on file  Occupational History  . Not on file  Tobacco Use  . Smoking status: Never Smoker  . Smokeless tobacco: Never Used  Substance and Sexual Activity  . Alcohol use: Not on file  . Drug use: Not on file  . Sexual activity: Not on file  Other Topics Concern  . Not on file  Social History Narrative  . Not on file   Social Determinants of Health   Financial Resource Strain:   . Difficulty of Paying Living Expenses:   Food Insecurity:   . Worried About Charity fundraiser in the Last Year:   . Arboriculturist in the Last Year:   Transportation Needs:   . Film/video editor (Medical):   Marland Kitchen Lack of Transportation (Non-Medical):   Physical Activity:   . Days of Exercise per Week:   . Minutes of Exercise per Session:   Stress:   . Feeling of Stress :   Social Connections:   . Frequency of Communication with Friends and Family:   . Frequency of Social Gatherings with Friends and Family:   . Attends Religious Services:   . Active Member of Clubs or Organizations:   . Attends Archivist Meetings:   Marland Kitchen Marital Status:   Intimate Partner Violence:   . Fear of Current or Ex-Partner:   . Emotionally Abused:   Marland Kitchen Physically Abused:   . Sexually Abused:     Outpatient Medications Prior to Visit  Medication Sig Dispense Refill  . acetaminophen (TYLENOL) 500 MG tablet Take 2 tablets by mouth every 8 (eight) hours as needed for moderate pain.    . calcitonin, salmon, (MIACALCIN/FORTICAL) 200 UNIT/ACT nasal spray Place 1 spray into alternate nostrils daily. 3.7 mL 0  . insulin glargine (LANTUS) 100 UNIT/ML Solostar Pen Inject 5 Units into the skin at bedtime. 3 mL 5  . Insulin Pen Needle (PEN NEEDLES) 30G X 5 MM  MISC Use as instructed. 100 each 12  . Vitamin D, Ergocalciferol, (DRISDOL) 1.25 MG (50000 UNIT) CAPS capsule Take 1 capsule (50,000 Units total) by mouth every 7 (seven) days. 8 capsule 0  . famotidine (PEPCID) 20 MG tablet Take 1 tablet (20 mg total) by mouth 2 (two) times daily. (Patient not taking: Reported on 05/08/2020) 60 tablet 0  . oxyCODONE-acetaminophen (PERCOCET) 5-325 MG tablet Take 1-2 tablets by mouth every 8 (eight) hours as needed for severe pain. (Patient not taking: Reported on 04/12/2020) 30 tablet 0   No facility-administered medications prior to visit.    No Known Allergies  ROS Review of Systems  Unable to perform ROS: Other      Objective:     There were no vitals taken for this visit. Wt Readings from Last 3 Encounters:  03/03/20 175 lb 11.3 oz (79.7 kg)     Health Maintenance Due  Topic Date Due  . HEMOGLOBIN A1C  Never done  . FOOT EXAM  Never done  . OPHTHALMOLOGY EXAM  Never done  . URINE MICROALBUMIN  Never done  .  COVID-19 Vaccine (1) Never done  . TETANUS/TDAP  Never done  . DEXA SCAN  Never done  . PNA vac Low Risk Adult (1 of 2 - PCV13) Never done    There are no preventive care reminders to display for this patient.  Lab Results  Component Value Date   TSH 0.780 03/09/2020   Lab Results  Component Value Date   WBC 4.7 03/23/2020   HGB 10.0 (L) 03/23/2020   HCT 33.0 (L) 03/23/2020   MCV 102.8 (H) 03/23/2020   PLT 257 03/23/2020   Lab Results  Component Value Date   NA 143 03/23/2020   K 4.6 03/23/2020   CO2 23 03/23/2020   GLUCOSE 139 (H) 03/23/2020   BUN 26 (H) 03/23/2020   CREATININE 1.28 (H) 03/26/2020   BILITOT 0.8 03/23/2020   ALKPHOS 281 (H) 03/23/2020   AST 35 03/23/2020   ALT 15 03/23/2020   PROT 5.3 (L) 03/23/2020   ALBUMIN 1.5 (L) 03/23/2020   CALCIUM 7.3 (L) 03/23/2020   ANIONGAP 6 03/23/2020   No results found for: CHOL No results found for: HDL No results found for: LDLCALC No results found for:  TRIG No results found for: CHOLHDL No results found for: HGBA1C    Assessment & Plan:   Problem List Items Addressed This Visit    None    Assessment and Plan: Patient is unable to ambulate to be able to come in for office visit and repeat labs.  Eden Lathe, clinical social worker at [facility] is working with patient to help resolve access to care issues.      I discussed the assessment and treatment plan with the patient. The patient was provided an opportunity to ask questions and all were answered. The patient agreed with the plan and demonstrated an understanding of the instructions.   The patient was advised to call back or seek an in-person evaluation if the symptoms worsen or if the condition fails to improve as anticipated.  I provided 25 minutes of non-face-to-face time during this encounter.    No orders of the defined types were placed in this encounter.   Follow-up: No follow-ups on file.    Loraine Grip Mayers, PA-C

## 2020-05-08 NOTE — Progress Notes (Signed)
Patient has eaten today. Patient
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
## 2020-05-09 NOTE — Telephone Encounter (Signed)
Call placed to patient's daughter, Caro Hight.  Christa See, LCSW present.  Explained to her daughter that we have information about some programs- [facility] and [employer]- that may provide some assistance for her in caring for her mother.  The comprehensive [facility] program might be able to address most of her needs.   She said that she was not able to talk and would call back in about 10 minutes.  She has the contact number to call [facility] directly.

## 2020-05-10 ENCOUNTER — Telehealth: Payer: Self-pay | Admitting: Licensed Clinical Social Worker

## 2020-05-10 ENCOUNTER — Telehealth: Payer: Self-pay

## 2020-05-10 NOTE — Telephone Encounter (Signed)
05/09/20 Incoming call received from pt's daughter, Milta Deiters. LCSW informed her of various supportive services that [facility] provides. LCSW obtained verbal consent to complete referral for patient.   05/10/2020 LCSW received a return call from [place] with [facility] [phone]. Referral for services was completed and LCSW was informed that Delcie Roch will make contact with family this week.

## 2020-05-10 NOTE — Telephone Encounter (Signed)
This CM spoke to [employer].  She explained that they do not accept medicaid

## 2020-05-12 ENCOUNTER — Telehealth: Payer: Self-pay | Admitting: Licensed Clinical Social Worker

## 2020-05-12 NOTE — Telephone Encounter (Signed)
Incoming call received from pt's daughter. She shared that [facility] will initiate services 06/11/2020. Pt's daughter states that pt is in need of different prescriptions for DME and medications. Soonest available appointment was scheduled for pt on 05/22/20.

## 2020-05-22 ENCOUNTER — Telehealth (INDEPENDENT_AMBULATORY_CARE_PROVIDER_SITE_OTHER): Payer: 59 | Admitting: Primary Care

## 2020-05-22 ENCOUNTER — Encounter (INDEPENDENT_AMBULATORY_CARE_PROVIDER_SITE_OTHER): Payer: Self-pay | Admitting: Primary Care

## 2020-05-22 DIAGNOSIS — Z7689 Persons encountering health services in other specified circumstances: Secondary | ICD-10-CM | POA: Diagnosis not present

## 2020-05-22 DIAGNOSIS — S72001S Fracture of unspecified part of neck of right femur, sequela: Secondary | ICD-10-CM
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
## 2020-05-24 ENCOUNTER — Telehealth: Payer: Self-pay | Admitting: Licensed Clinical Social Worker
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
## 2020-05-27 ENCOUNTER — Other Ambulatory Visit: Payer: Self-pay

## 2020-05-27 ENCOUNTER — Emergency Department (HOSPITAL_COMMUNITY)
Admission: EM | Admit: 2020-05-27 | Discharge: 2020-05-30 | Disposition: A | Discharge status: Home / Self Care | Payer: 59 | Attending: Emergency Medicine | Admitting: Emergency Medicine

## 2020-05-27 ENCOUNTER — Emergency Department (HOSPITAL_COMMUNITY): Payer: 59

## 2020-05-27 ENCOUNTER — Encounter (HOSPITAL_COMMUNITY): Payer: Self-pay

## 2020-05-27 DIAGNOSIS — Z7189 Other specified counseling: Secondary | ICD-10-CM | POA: Diagnosis present

## 2020-05-27 DIAGNOSIS — R531 Weakness: Secondary | ICD-10-CM | POA: Insufficient documentation

## 2020-05-27 DIAGNOSIS — E1122 Type 2 diabetes mellitus with diabetic chronic kidney disease: Secondary | ICD-10-CM | POA: Diagnosis not present

## 2020-05-27 DIAGNOSIS — M79605 Pain in left leg: Secondary | ICD-10-CM | POA: Diagnosis not present

## 2020-05-27 DIAGNOSIS — Z20822 Contact with and (suspected) exposure to covid-19: Secondary | ICD-10-CM | POA: Insufficient documentation

## 2020-05-27 DIAGNOSIS — Z794 Long term (current) use of insulin: Secondary | ICD-10-CM | POA: Diagnosis not present

## 2020-05-27 DIAGNOSIS — N183 Chronic kidney disease, stage 3 unspecified: Secondary | ICD-10-CM | POA: Diagnosis not present

## 2020-05-27 DIAGNOSIS — Z79899 Other long term (current) drug therapy: Secondary | ICD-10-CM | POA: Insufficient documentation

## 2020-05-27 DIAGNOSIS — M79604 Pain in right leg: Secondary | ICD-10-CM | POA: Diagnosis not present

## 2020-05-27 DIAGNOSIS — Z96641 Presence of right artificial hip joint: Secondary | ICD-10-CM | POA: Diagnosis not present

## 2020-05-27 LAB — CBC WITH DIFFERENTIAL/PLATELET
Abs Immature Granulocytes: 0.02 10*3/uL (ref 0.00–0.07)
Basophils Absolute: 0 10*3/uL (ref 0.0–0.1)
Basophils Relative: 1 %
Eosinophils Absolute: 0.3 10*3/uL (ref 0.0–0.5)
Eosinophils Relative: 5 %
HCT: 42 % (ref 36.0–46.0)
Hemoglobin: 13.1 g/dL (ref 12.0–15.0)
Immature Granulocytes: 0 %
Lymphocytes Relative: 32 %
Lymphs Abs: 1.8 10*3/uL (ref 0.7–4.0)
MCH: 30.2 pg (ref 26.0–34.0)
MCHC: 31.2 g/dL (ref 30.0–36.0)
MCV: 96.8 fL (ref 80.0–100.0)
Monocytes Absolute: 0.6 10*3/uL (ref 0.1–1.0)
Monocytes Relative: 11 %
Neutro Abs: 3 10*3/uL (ref 1.7–7.7)
Neutrophils Relative %: 51 %
Platelets: 215 10*3/uL (ref 150–400)
RBC: 4.34 MIL/uL (ref 3.87–5.11)
RDW: 14.9 % (ref 11.5–15.5)
WBC: 5.8 10*3/uL (ref 4.0–10.5)
nRBC: 0 % (ref 0.0–0.2)

## 2020-05-27 LAB — COMPREHENSIVE METABOLIC PANEL
ALT: 37 U/L (ref 0–44)
AST: 82 U/L — ABNORMAL HIGH (ref 15–41)
Albumin: 2 g/dL — ABNORMAL LOW (ref 3.5–5.0)
Alkaline Phosphatase: 354 U/L — ABNORMAL HIGH (ref 38–126)
Anion gap: 6 (ref 5–15)
BUN: 37 mg/dL — ABNORMAL HIGH (ref 8–23)
CO2: 25 mmol/L (ref 22–32)
Calcium: 8.3 mg/dL — ABNORMAL LOW (ref 8.9–10.3)
Chloride: 107 mmol/L (ref 98–111)
Creatinine, Ser: 1.35 mg/dL — ABNORMAL HIGH (ref 0.44–1.00)
GFR calc Af Amer: 42 mL/min — ABNORMAL LOW (ref >60)
GFR calc non Af Amer: 36 mL/min — ABNORMAL LOW (ref >60)
Glucose, Bld: 269 mg/dL — ABNORMAL HIGH (ref 70–99)
Potassium: 4.2 mmol/L (ref 3.5–5.1)
Sodium: 138 mmol/L (ref 135–145)
Total Bilirubin: 0.7 mg/dL (ref 0.3–1.2)
Total Protein: 7.1 g/dL (ref 6.5–8.1)

## 2020-05-27 LAB — URINALYSIS, ROUTINE W REFLEX MICROSCOPIC
Bilirubin Urine: NEGATIVE
Glucose, UA: NEGATIVE mg/dL
Ketones, ur: NEGATIVE mg/dL
Nitrite: NEGATIVE
Protein, ur: 30 mg/dL — AB
Specific Gravity, Urine: 1.013 (ref 1.005–1.030)
pH: 5 (ref 5.0–8.0)

## 2020-05-27 LAB — CBG MONITORING, ED: Glucose-Capillary: 315 mg/dL — ABNORMAL HIGH (ref 70–99)

## 2020-05-27 LAB — SARS CORONAVIRUS 2 (TAT 6-24 HRS): SARS Coronavirus 2: NEGATIVE

## 2020-05-27 MED ORDER — CALCITONIN (SALMON) 200 UNIT/ACT NA SOLN
1.0000 | Freq: Every day | NASAL | Status: DC
  Administered 2020-05-28: 1 via NASAL
  Filled 2020-05-27: qty 3.7

## 2020-05-27 MED ORDER — VITAMIN D (ERGOCALCIFEROL) 1.25 MG (50000 UNIT) PO CAPS
50000.0000 [IU] | ORAL_CAPSULE | ORAL | Status: DC
  Administered 2020-05-28: 50000 [IU] via ORAL
  Filled 2020-05-27: qty 1

## 2020-05-27 MED ORDER — INSULIN GLARGINE 100 UNIT/ML ~~LOC~~ SOLN
5.0000 [IU] | Freq: Every day | SUBCUTANEOUS | Status: DC
  Administered 2020-05-27 – 2020-05-30 (×3): 5 [IU] via SUBCUTANEOUS
  Filled 2020-05-27 (×4): qty 0.05

## 2020-05-27 MED ORDER — FAMOTIDINE 20 MG PO TABS
20.0000 mg | ORAL_TABLET | Freq: Two times a day (BID) | ORAL | Status: DC
  Administered 2020-05-27 – 2020-05-30 (×5): 20 mg via ORAL
  Filled 2020-05-27 (×6): qty 1

## 2020-05-27 MED ORDER — ACETAMINOPHEN 500 MG PO TABS
1000.0000 mg | ORAL_TABLET | Freq: Three times a day (TID) | ORAL | Status: DC | PRN
  Administered 2020-05-28: 1000 mg via ORAL
  Filled 2020-05-27: qty 2

## 2020-05-27 NOTE — ED Provider Notes (Signed)
[facility] EMERGENCY DEPARTMENT Provider Note   CSN: [id number] Arrival date & time: 05/27/20  1005     History No chief complaint on file.   Whitney [name] is a 85 y.o. female.  HPI     Elderly female from [place] presents with her daughter who provides the history, acts as a Optometrist, per her request for the patient. Patient has multiple medical issues including baseline disability, has been bedbound for possibly 12 years. She has a more recent medical history of hip fracture, sustained while she was in [place] earlier this year.  During that procedure, reportedly, the patient was found to have right distal tibia fracture, and in addition to her hip repair had immobilization of the right leg. Not long after the patient had discovery of left distal tibia fracture as well.  She now presents with family concerns of prolonged immobilization of both of these lower extremities, as well as difficulty with caring for the patient. History is obtained by the patient, her daughter, and notes, as well as by social workers. On chart review is clear the patient is currently residing in a third floor apartment, and there is any difficulty obtaining home health services. An Adult Protective Services investigation has been started, and the daughter notes that those investigators were in her apartment yesterday. The patient denies pain, anywhere, denies new weakness, denies other complaints. Daughter denies new fall, fever, other changes from baseline.   Past Medical History:  Diagnosis Date  . Diabetes mellitus without complication [facility])     Patient Active Problem List   Diagnosis Date Noted  . Dehydration   . Pain   . Goals of care, counseling/discussion   . Advanced care planning/counseling discussion   . Palliative care by specialist   . Closed right hip fracture, initial encounter ([place]) 03/03/2020  . Diabetes mellitus without complication ([place])   . CKD (chronic kidney  disease), stage III   . Closed right hip fracture ([place])   . Protein calorie malnutrition ([place])   . Bilateral leg edema   . Intractable pain 03/02/2020    Past Surgical History:  Procedure Laterality Date  . TOTAL HIP ARTHROPLASTY Right 03/05/2020   Procedure: HIP GIRDLE STONE;  Surgeon: Leandrew Koyanagi, MD;  Location: [facility];  Service: Orthopedics;  Laterality: Right;     OB History   No obstetric history on file.     History reviewed. No pertinent family history.  Social History   Tobacco Use  . Smoking status: Never Smoker  . Smokeless tobacco: Never Used  Substance Use Topics  . Alcohol use: Never  . Drug use: Never    Home Medications Prior to Admission medications   Medication Sig Start Date End Date Taking? Authorizing Provider  acetaminophen (TYLENOL) 500 MG tablet Take 2 tablets by mouth every 8 (eight) hours as needed for moderate pain. 02/28/20   [provider]  calcitonin, salmon, (MIACALCIN/FORTICAL) 200 UNIT/ACT nasal spray Place 1 spray into alternate nostrils daily. 03/30/20   Geradine Girt, DO  famotidine (PEPCID) 20 MG tablet Take 1 tablet (20 mg total) by mouth 2 (two) times daily. Patient not taking: Reported on 05/08/2020 03/30/20   Geradine Girt, DO  insulin glargine (LANTUS) 100 UNIT/ML Solostar Pen Inject 5 Units into the skin at bedtime. 04/12/20   Argentina Donovan, PA-C  Insulin Pen Needle (PEN NEEDLES) 30G X 5 MM MISC Use as instructed. 04/26/20   Argentina Donovan, PA-C  oxyCODONE-acetaminophen (PERCOCET) [phone]  MG tablet Take 1-2 tablets by mouth every 8 (eight) hours as needed for severe pain. Patient not taking: Reported on 04/12/2020 03/05/20   Leandrew Koyanagi, MD  Vitamin D, Ergocalciferol, (DRISDOL) 1.25 MG (50000 UNIT) CAPS capsule Take 1 capsule (50,000 Units total) by mouth every 7 (seven) days. 03/30/20   Geradine Girt, DO    Allergies    Patient has no known allergies.  Review of Systems   Review of Systems  Constitutional:        Per HPI, otherwise negative  HENT:       Per HPI, otherwise negative  Respiratory:       Per HPI, otherwise negative  Cardiovascular:       Per HPI, otherwise negative  Gastrointestinal: Negative for vomiting.  Endocrine:       Negative aside from HPI  Genitourinary:       Neg aside from HPI   Musculoskeletal:       Per HPI, otherwise negative  Skin: Negative for wound.  Neurological: Positive for weakness. Negative for syncope.    Physical Exam Updated Vital Signs BP (!) 143/64   Pulse 90   Temp 97.9 F (36.6 C) (Oral)   Resp 18   Ht 5\' 2"  (1.575 m)   SpO2 95%   BMI 32.14 kg/m   Physical Exam Vitals and nursing note reviewed.  Constitutional:      General: She is not in acute distress.    Appearance: She is well-developed. She is not toxic-appearing or diaphoretic.  HENT:     Head: Normocephalic and atraumatic.  Eyes:     Conjunctiva/sclera: Conjunctivae normal.  Cardiovascular:     Rate and Rhythm: Normal rate and regular rhythm.  Pulmonary:     Effort: Pulmonary effort is normal. No respiratory distress.     Breath sounds: Normal breath sounds. No stridor.  Abdominal:     General: There is no distension.  Musculoskeletal:     Comments: On both lower distal to the splint there is no erythema, the patient moves toes spontaneously. Proximal to splint there is no erythema, swelling.   Skin:    General: Skin is warm and dry.  Neurological:     Mental Status: She is alert and oriented to person, place, and time.     Cranial Nerves: No cranial nerve deficit.     ED Results / Procedures / Treatments   Labs (all labs ordered are listed, but only abnormal results are displayed) Labs Reviewed  COMPREHENSIVE METABOLIC PANEL - Abnormal; Notable for the following components:      Result Value   Glucose, Bld 269 (*)    BUN 37 (*)    Creatinine, Ser 1.35 (*)    Calcium 8.3 (*)    Albumin 2.0 (*)    AST 82 (*)    Alkaline Phosphatase 354 (*)    GFR calc non Af  Amer 36 (*)    GFR calc Af Amer 42 (*)    All other components within normal limits  URINALYSIS, ROUTINE W REFLEX MICROSCOPIC - Abnormal; Notable for the following components:   Color, Urine AMBER (*)    APPearance TURBID (*)    Hgb urine dipstick MODERATE (*)    Protein, ur 30 (*)    Leukocytes,Ua LARGE (*)    All other components within normal limits  SARS CORONAVIRUS 2 (TAT 6-24 HRS)  CBC WITH DIFFERENTIAL/PLATELET    EKG None  Radiology DG Ankle Complete Left  Result Date:  05/27/2020 CLINICAL DATA:  Evaluate fracture. EXAM: LEFT ANKLE COMPLETE - 3+ VIEW COMPARISON:  Left ankle of 03/19/2020 FINDINGS: Again noted is diffuse osteopenia in the bones. Small amount of callus formation involving the mildly displaced distal tibial fracture. Evidence for some callus formation involving the mildly displaced distal fibular fracture. Bone detail is limited by the casting material. IMPRESSION: There has been some interval healing with callus formation involving the distal tibia and fibula fractures. Overall alignment of the fractures has not significantly changed. Extensive osteopenia. Electronically Signed   By: Markus Daft M.D.   On: 05/27/2020 11:49   DG Ankle Complete Right  Result Date: 05/27/2020 CLINICAL DATA:  The patient suffered left ankle fractures on approximately 03/19/2020. Subsequent encounter. EXAM: RIGHT ANKLE - COMPLETE 3+ VIEW COMPARISON:  Plain films left ankle 03/05/2020. FINDINGS: Fiberglass cast remains in place. Fractures of the distal tibia and fibula are again seen. There has been some progression of healing with bridging bone identified and some callus formation present. Bones are osteopenic. Position and alignment are near anatomic. No new abnormality. IMPRESSION: Healing distal tibial and fibular fractures.  No acute abnormality. Osteopenia. Electronically Signed   By: Inge Rise M.D.   On: 05/27/2020 11:48    Procedures Procedures (including critical care  time)  Medications Ordered in ED Medications - No data to display  ED Course  I have reviewed the triage vital signs and the nursing notes.  Pertinent labs & imaging results that were available during my care of the patient were reviewed by me and considered in my medical decision making (see chart for details).  After initial evaluation with knowledge of the patient's ongoing APS investigation I discussed her case with her social work Medical laboratory scientific officer for assistance with consideration of placement, evaluation. Patient's initial physical exam is generally reassuring, given the knowledge of bilateral extremity fractures, x-rays ordered.  With consideration of her weakness, labs, urinalysis ordered.  Update: I discussed the patient's case with her orthopedist on-call, Dr. Marlou Sa. Per recommendation, the patient had removal of both of her lower extremity splints.  Subsequently she was able to move both, had no pain in either leg with her daughter we discussed precautions against any kind of weightbearing, however, as the patient has been bedbound for approximately 12 years, this may be redundant. Patient's studies otherwise back, reviewed, generally reassuring aside from mild renal dysfunction, mild hematuria, patient has no urinary complaints, no fever, and he has no complaints currently at all. She is hemodynamically unremarkable. Have again discussed her case with her social work Medical laboratory scientific officer, and they are coordinating for appropriate placement.  Final Clinical Impression(s) / ED Diagnoses Final diagnoses:  Weakness  Pain in both lower extremities     Carmin Muskrat, MD 05/27/20 1535

## 2020-05-27 NOTE — ED Triage Notes (Signed)
Patient arrives via [facility] from home due to concerns for needing case management due to patients living arrangements. Per [facility], the patients daughter was referred by caseworker to call EMS for the patient to seek further treatment since the patient has two broken legs that are casted with limited mobility. Per the patient, she was supposed have her cast removed over 1 month ago.   [facility] rep states that they called patients primary physicians office/orthopedic doctor, and home health with no answers or assistance. Patient brought here for case management.

## 2020-05-27 NOTE — ED Provider Notes (Signed)
  Physical Exam  BP (!) 143/64   Pulse 90   Temp 97.9 F (36.6 C) (Oral)   Resp 18   Ht 1.575 m (5\' 2" )   SpO2 95%   BMI 32.14 kg/m   Physical Exam  ED Course/Procedures     Procedures  MDM  Patient here with hip fx and bilateral ankle fracture, bed bound at baseline, Arabic speaking.  Attempting to get home with home health.  Daughter present but having difficulty due to third floor      Pattricia Boss, MD 05/28/20 0002

## 2020-05-27 NOTE — NC FL2 (Signed)
[place] LEVEL OF CARE SCREENING TOOL     IDENTIFICATION  Patient Name: Whitney [name] Birthdate: 05-30-1935 Sex: female Admission Date (Current Location): 05/27/2020  [facility] and [place] Number:  Kathleen Argue [id number] Facility and Address:  [facility] [place] [address] [place], [place] [address]      Provider Number: [id number]  Attending Physician Name and Address:  Carmin Muskrat, MD  Relative Name and Phone Number:  Sandria Manly [phone]    Current Level of Care: Other (Comment) (Emergency department) Recommended Level of Care: [place] Prior Approval Number:    Date Approved/Denied: 03/14/20 PASRR Number: [id number]  Discharge Plan: Home    Current Diagnoses: Patient Active Problem List   Diagnosis Date Noted  . Dehydration   . Pain   . Goals of care, counseling/discussion   . Advanced care planning/counseling discussion   . Palliative care by specialist   . Closed right hip fracture, initial encounter ([place]) 03/03/2020  . Diabetes mellitus without complication ([place])   . CKD (chronic kidney disease), stage III   . Closed right hip fracture ([place])   . Protein calorie malnutrition ([place])   . Bilateral leg edema   . Intractable pain 03/02/2020    Orientation RESPIRATION BLADDER Height & Weight     Self, Time, Place  Normal   Weight:   Height:  5\' 2"  (157.5 cm)  BEHAVIORAL SYMPTOMS/MOOD NEUROLOGICAL BOWEL NUTRITION STATUS      Incontinent    AMBULATORY STATUS COMMUNICATION OF NEEDS Skin   Extensive Assist Verbally Normal                       Personal Care Assistance Level of Assistance  Total care       Total Care Assistance: Limited assistance   Functional Limitations Info             SPECIAL CARE FACTORS FREQUENCY  PT (By licensed PT), OT (By licensed OT)     PT Frequency: 5x weekly OT Frequency: 5x weekly            Contractures Contractures Info: Not present     Additional Factors Info  Code Status, Allergies Code Status Info: prior DNR Allergies Info: NKDA           Current Medications (05/27/2020):  This is the current hospital active medication list Current Facility-Administered Medications  Medication Dose Route Frequency Provider Last Rate Last Admin  . acetaminophen (TYLENOL) tablet 1,000 mg  1,000 mg Oral Q8H PRN Carmin Muskrat, MD      . calcitonin (salmon) (MIACALCIN/FORTICAL) nasal spray 1 spray  1 spray Alternating Nares Daily Carmin Muskrat, MD      . famotidine (PEPCID) tablet 20 mg  20 mg Oral BID Carmin Muskrat, MD      . insulin glargine (LANTUS) Solostar Pen 5 Units  5 Units Subcutaneous QHS Carmin Muskrat, MD      . Vitamin D (Ergocalciferol) (DRISDOL) capsule 50,000 Units  50,000 Units Oral Q7 days Carmin Muskrat, MD       Current Outpatient Medications  Medication Sig Dispense Refill  . acetaminophen (TYLENOL) 500 MG tablet Take 2 tablets by mouth every 8 (eight) hours as needed for moderate pain.    . calcitonin, salmon, (MIACALCIN/FORTICAL) 200 UNIT/ACT nasal spray Place 1 spray into alternate nostrils daily. 3.7 mL 0  . famotidine (PEPCID) 20 MG tablet Take 1 tablet (20 mg total) by mouth 2 (two) times daily. (Patient not taking:  Reported on 05/08/2020) 60 tablet 0  . insulin glargine (LANTUS) 100 UNIT/ML Solostar Pen Inject 5 Units into the skin at bedtime. 3 mL 5  . Insulin Pen Needle (PEN NEEDLES) 30G X 5 MM MISC Use as instructed. 100 each 12  . oxyCODONE-acetaminophen (PERCOCET) 5-325 MG tablet Take 1-2 tablets by mouth every 8 (eight) hours as needed for severe pain. (Patient not taking: Reported on 04/12/2020) 30 tablet 0  . Vitamin D, Ergocalciferol, (DRISDOL) 1.25 MG (50000 UNIT) CAPS capsule Take 1 capsule (50,000 Units total) by mouth every 7 (seven) days. 8 capsule 0     Discharge Medications: Please see discharge summary for a list of discharge medications.  Relevant Imaging Results:  Relevant  Lab Results:   Additional Information SS # [id number]  Oretha Milch, LCSW

## 2020-05-27 NOTE — TOC Progression Note (Signed)
Transition of Care [facility]) - Progression Note    Patient Details  Name: Whitney [name] MRN: [id number] Date of Birth: 1934/12/15  Transition of Care [facility]) CM/SW [place], LCSW Phone Number: 05/27/2020, 4:56 PM  Clinical Narrative: CSW spoke with [facility] regarding patient. CSW notes patient will likely be recommended SNF by PT pending evaluation based upon patient's history of being bedridden. CSW spoke with patient's daughter and discussed SNF placement. CSW notes no specific preference although requested four or five star. CSW discussed referral process and practical SNF facilities. CSW completed FL-2, referred patient for admission, and TOC will continue to follow patient's care through her healthcare encounter.    Expected Discharge Plan: Skilled Nursing Facility Barriers to Discharge: Continued Medical Work up  Expected Discharge Plan and Services Expected Discharge Plan: [place] In-house Referral: Clinical Social Work, Advice worker Services: CM Consult                                           Social Determinants of Health (SDOH) Interventions    Readmission Risk Interventions No flowsheet data found.

## 2020-05-27 NOTE — ED Notes (Signed)
Dinner ordered 

## 2020-05-27 NOTE — TOC Initial Note (Signed)
Transition of Care [facility]) - Initial/Assessment Note    Patient Details  Name: Whitney [name] MRN: [id number] Date of Birth: [birth year]  Transition of Care [facility]) CM/SW Contact:    Verdell Carmine, RN Phone Number: 05/27/2020, 12:00 PM  Clinical Narrative:                 Patient came in with [facility]  And it was explained as a APS follow up . The daughter called and stated to EMS that the patient needed to go to the EMS to get checked out and get casts off as nobody could do it, and to follow up with APS call/investigation from Thursday. According to the notes, the patient had been working with [employer] [facility], but the daughter sounded impatient, the patient had no insurance earlier, but had obtained it July 1. Casts have been on one month too long.  Ortho was called by EMS but did not answer at time of call, but they are closed, and can not do any procedures at this time.  Patient has osteopenia , this may be a causative factor in fractures, calcium 8.3, but albumin 2.0  Corrected calcium is 9.8.   PT consult is ordered, work up for placement  PT paged to let them know.  [facility] called  to get more information surrounding the investigation concerning APS. They were working her up due to the fact that her casts have not come off and she is unable to get up and out to MD follow up case worker to contact at [facility] is Cristy Friedlander [phone] she is aware patient is here and will call back Monday for updates if needed.  I spoke to the daughter .  She is trying to do her best, she has been out of work to try to take care of her mother, but it is too much for her,. She wants to get to a first floor apartment, but her mom needs rehabilitation. She is all for placement. At this point. CSW is consulted. Daughter tearing up at times. Wants to do what is best for her mother. Mother has not been vaccinated. Will need COVID test.    Expected Discharge Plan: [place] Barriers to Discharge: Continued Medical Work up   Patient Goals and CMS Choice Patient states their goals for this hospitalization and ongoing recovery are:: placement      Expected Discharge Plan and Services Expected Discharge Plan: Skilled Nursing Facility In-house Referral: Clinical Social Work, Counselling psychologist Discharge Planning Services: CM Consult                                          Prior Living Arrangements/Services   Lives with:: Adult Children Patient language and need for interpreter reviewed:: Yes        Need for Family Participation in Patient Care: Yes (Comment) Care giver support system in place?: No (comment) (daughter has expressed that she is unable to care for patient)   Criminal Activity/Legal Involvement Pertinent to Current Situation/Hospitalization:  (unknown under investigation)  Activities of Daily Living      Permission Sought/Granted Permission sought to share information with : Case Manager Permission granted to share information with : Yes, Verbal Permission Granted              Emotional Assessment Appearance:: Appears stated age  Alcohol / Substance Use: Not Applicable Psych Involvement: No (comment)  Admission diagnosis:  case management Patient Active Problem List   Diagnosis Date Noted  . Dehydration   . Pain   . Goals of care, counseling/discussion   . Advanced care planning/counseling discussion   . Palliative care by specialist   . Closed right hip fracture, initial encounter ([place]) 03/03/2020  . Diabetes mellitus without complication ([place])   . CKD (chronic kidney disease), stage III   . Closed right hip fracture ([place])   . Protein calorie malnutrition ([place])   . Bilateral leg edema   . Intractable pain 03/02/2020   PCP:  Patient, No Pcp Per Pharmacy:   [id number] [place], [place] [address] Phone: [phone] Fax:  [phone]     Social Determinants of Health (SDOH) Interventions    Readmission Risk Interventions No flowsheet data found.

## 2020-05-28 LAB — CBG MONITORING, ED
Glucose-Capillary: 215 mg/dL — ABNORMAL HIGH (ref 70–99)
Glucose-Capillary: 318 mg/dL — ABNORMAL HIGH (ref 70–99)

## 2020-05-28 NOTE — Evaluation (Signed)
Physical Therapy Evaluation Patient Details Name: Whitney [name] MRN: [id number] DOB: [birth year] Today's Date: 05/28/2020   History of Present Illness  85 y.o. female with multiple medical issues including baseline disability, has been bedbound for possibly 12 years. She has a more recent medical history of hip fracture, sustained while she was in [place] earlier this year.  During that procedure, reportedly, the patient was found to have right distal tibia fracture, and in addition to her hip repair had immobilization of the right leg. Not long after the patient had discovery of left distal tibia fracture as well.  She now presents with family concerns of prolonged immobilization of both of these lower extremities, as well as difficulty with caring for the patient.  Clinical Impression  Pt presents to PT at or near her functional baseline per pt report. Pt reports she is able to roll in bed at home and assist with ADLs at bed-level, but does require assistance for these activities. Pt reports not sitting up on the side of the bed for at least 9-10 months, and that she has not been out of bed to stand or walk in 12 years. Pt is able to roll to both sides this session with assistance and appears to be close to her functional baseline. Due to prolonged immobility and current status of NWB BLE the patient does not demonstrate the potential to make significant functional gains with continued skilled PT services. Per chart review it appears as the family has been having increased difficulty caring for the patient in the home, and may unable to safely do so at this point in time. PT thus recommends SNF placement for patient safety. If the patient were to return home however, the pt would benefit from having 24/7 assistance, as well as a power wheelchair that is able to tilt, and a mechanical lift to aide in transfers (if it is safe for the pt to utilize with history of fractures and immobility, may need to consult  orthopedics on this). The pt requires no further acute PT services as she appears to be near her functional baseline. Acute PT signing off.    Follow Up Recommendations No PT follow up;Supervision/Assistance - 24 hour    Equipment Recommendations  Wheelchair (measurements PT);Wheelchair cushion (measurements PT);Other (comment) (mechanical lift)    Recommendations for Other Services       Precautions / Restrictions Precautions Precautions: Fall Restrictions Weight Bearing Restrictions: Yes RLE Weight Bearing: Non weight bearing LLE Weight Bearing: Non weight bearing Other Position/Activity Restrictions: per MD notes pt is NWB BLE, pt also reports fractures in LUE, unsure if these are healed or not      Mobility  Bed Mobility Overal bed mobility: Needs Assistance Bed Mobility: Rolling Rolling: Mod assist;Max assist         General bed mobility comments: modA to roll left, maxA to roll right  Transfers                    Ambulation/Gait                Stairs            Wheelchair Mobility    Modified Rankin (Stroke Patients Only)       Balance  Pertinent Vitals/Pain Pain Assessment: No/denies pain    Home Living Family/patient expects to be discharged to:: Unsure                 Additional Comments: pt is from apartment with daughter on 3rd floor per chart review. Pt reports owning a hospital bed, difficult to determine other DME in the home as the interpreter does not speak the same dialect of arabic as the patient and no interpreters of her dialect are available at time of PT eval    Prior Function Level of Independence: Needs assistance   Gait / Transfers Assistance Needed: pt rolls in bed only, has not sat at the edge of bed in 10 months and has not been out of bed in 12 years per pt report  ADL's / Homemaking Assistance Needed: requires assistance for all ADLs, not  totalA, able to utilize BUE to assist with feeding, bathing, dressing, etc. Requires at least setup for all ADLs        Hand Dominance   Dominant Hand: Right    Extremity/Trunk Assessment   Upper Extremity Assessment Upper Extremity Assessment: RUE deficits/detail;LUE deficits/detail RUE Deficits / Details: grossly 4/5, AROM WFL LUE Deficits / Details: grip, wrist, elbow strength 4-/5. Pt with no noted shoulder AROM, pt is able to perform self-PROM [facility] when utilizing RUE to lift LUE    Lower Extremity Assessment Lower Extremity Assessment: RLE deficits/detail;LLE deficits/detail RLE Deficits / Details: 3/5 PF/DF, 2-/5 knee extension/flexion. AROM is limited significantly, PROM assessment deferred due to pt reports of multiple fractures in BLE and unsure if these are completely healed, also noted to be NWB per MD in chart LLE Deficits / Details: 3/5 PF/DF, 2-/5 knee extension/flexion. AROM is limited significantly, PROM assessment deferred due to pt reports of multiple fractures in BLE and unsure if these are completely healed, also noted to be NWB per MD in chart    Cervical / Trunk Assessment Cervical / Trunk Assessment:  (difficult to assess lying in bed)  Communication   Communication: Prefers language other than English (Arabic)  Cognition Arousal/Alertness: Awake/alert Behavior During Therapy: WFL for tasks assessed/performed Overall Cognitive Status: Difficult to assess                                 General Comments: interpreter of different dialect. Pt's reported history appears to go along with what is documented in the chart      General Comments General comments (skin integrity, edema, etc.): VSS on RA, PT assists pt in completeing oral hygiene tasks, providing setup    Exercises     Assessment/Plan    PT Assessment Patent does not need any further PT services  PT Problem List         PT Treatment Interventions      PT Goals (Current goals  can be found in the Care Plan section)       Frequency     Barriers to discharge        Co-evaluation               AM-PAC PT "6 Clicks" Mobility  Outcome Measure Help needed turning from your back to your side while in a flat bed without using bedrails?: Total Help needed moving from lying on your back to sitting on the side of a flat bed without using bedrails?: Total Help needed moving to and from a bed to a chair (  including a wheelchair)?: Total Help needed standing up from a chair using your arms (e.g., wheelchair or bedside chair)?: Total Help needed to walk in hospital room?: Total Help needed climbing 3-5 steps with a railing? : Total 6 Click Score: 6    End of Session   Activity Tolerance: Patient tolerated treatment well Patient left: in bed;with call bell/phone within reach Nurse Communication: Mobility status;Need for lift equipment      Time: 4268-3419 PT Time Calculation (min) (ACUTE ONLY): 24 min   Charges:   PT Evaluation $PT Eval Moderate Complexity: 1 Mod          Zenaida Niece, PT, DPT Acute Rehabilitation Pager: [phone]   Zenaida Niece 05/28/2020, 12:56 PM

## 2020-05-28 NOTE — ED Notes (Signed)
This RN assessed pt using an interpreter pad "Wallie" due to pt only being able to speak arabic. Pt is aox4. Complains of bilateral leg pain rating the pain at 9 from a scale of 0-10. Pt is going to be moved into a hospital bed for comfort.

## 2020-05-28 NOTE — TOC Progression Note (Addendum)
Transition of Care [facility]) - Progression Note    Patient Details  Name: Malaisha Silliman MRN: [id number] Date of Birth: Sep 22, 1935  Transition of Care [facility]) CM/SW [place], RN Phone Number: 05/28/2020, 5:26 PM  Clinical Narrative:     have acceptance to both [place] and [place] for [place] [facility]. Called both numbers and left messages to return my call . Orpah Cobb [phone] I was given that number to call left a message.  But the mailbox was full.  Sent my number through SMS.  Both called back and both will do authorization tomorrow. Reached out to daughter to give choice.. Both are about the same distance away from [place]. Left message for her to call back tomorrow    Expected Discharge Plan: Skilled Nursing Facility Barriers to Discharge: Continued Medical Work up  Expected Discharge Plan and Services Expected Discharge Plan: [place] In-house Referral: Clinical Social Work, Advice worker Services: CM Consult                                           Social Determinants of Health (SDOH) Interventions    Readmission Risk Interventions No flowsheet data found.

## 2020-05-28 NOTE — TOC Progression Note (Signed)
Transition of Care [facility]) - Progression Note    Patient Details  Name: Whitney [name] MRN: [id number] Date of Birth: 07-03-1935  Transition of Care [facility]) CM/SW [place], RN Phone Number: 05/28/2020, 1:35 PM  Clinical Narrative:    PT called to remind them of consult earlier, they came and evaluated patient and as predicted they recommended SNF. Due to patient being bedridden for a long period, she is not a candidate for rehab, but long term care, does not need PT services. May need  OT for fine motor while in SNF if does go home would need wheelchair lift, DME- and 247 supervision which daughter cannot provide. She is currently on third floor, with no elevator which does not allow for mobility in and out of apartment for md appointments with Wheelchair etc. Currently looking for placement for patient. Patient and daughter in agreeable with this.    Expected Discharge Plan: Skilled Nursing Facility Barriers to Discharge: Continued Medical Work up  Expected Discharge Plan and Services Expected Discharge Plan: [place] In-house Referral: Clinical Social Work, Advice worker Services: CM Consult                                           Social Determinants of Health (SDOH) Interventions    Readmission Risk Interventions No flowsheet data found.

## 2020-05-28 NOTE — ED Notes (Signed)
Pt transferred over to a hospital bed. Buttock and lower back was assessed. No signs of skin break down. Will continue to monitor.

## 2020-05-29 NOTE — Progress Notes (Signed)
4:50 CSW called daughter @ [phone] to discuss where she would like to aacceprt bed placement. Daughter states that she does not want either [facility] or [facility] due to distance from Pt's home.   5:00ToC Team reached out to [facility] in an effort to find a bed closer to Pt.  [facility] is unable to make an offer due to insurance carrier.   6:49Per Pt's daughter's request, CSW sent email to [email or web address] to inform her of the decision from [facility].  CSW cc'ed CSW Madilyn Fireman to ensure continuity of care.

## 2020-05-29 NOTE — Progress Notes (Addendum)
1:45pm: CSW attempted to reach patient's daughter without success - SMS message sent requesting return call.  10:30am: CSW received return call from Whitney [name] at [facility] who states she has spoken with the patient's daughter several times. Whitney [name] states the patient does not eat American food and that the patient's daughter wants to provide that at the facility.  8:30am: CSW spoke with patient's daughter Whitney [name] at [phone] to present her with bed offers from [facility] and [facility]. Whitney [name] stating she will not make a decision regarding facility until she is able to do research on them both.   CSW encouraged Whitney [name] to make a decision as soon as possible as insurance authorization will still need to be obtained.  Madilyn Fireman, MSW, LCSW-A Transitions of Care  Clinical Social Worker  [facility] Emergency Departments  Medical ICU [phone]

## 2020-05-29 NOTE — Progress Notes (Addendum)
Inpatient Diabetes Program Recommendations  AACE/ADA: New Consensus Statement on Inpatient Glycemic Control (2015)  Target Ranges:  Prepandial:   less than 140 mg/dL      Peak postprandial:   less than 180 mg/dL (1-2 hours)      Critically ill patients:  140 - 180 mg/dL   Lab Results  Component Value Date   GLUCAP 318 (H) 05/28/2020    Review of Glycemic Control Results for EVONE, ARSENEAU (MRN [id number]) as of 05/29/2020 10:28  Ref. Range 05/27/2020 17:57 05/28/2020 11:43 05/28/2020 21:37  Glucose-Capillary Latest Ref Range: 70 - 99 mg/dL 315 (H) 215 (H) 318 (H)   Diabetes history: DM 2 Outpatient Diabetes medications: Lantus 5 units qhs Current orders for Inpatient glycemic control: Lantus 5 units qhs  Inpatient Diabetes Program Recommendations:    Glucose 200-300 range  - Check CBGs ACHS -Increase Lantus to 10 units  Thanks,  Tama Headings RN, MSN, BC-ADM Inpatient Diabetes Coordinator Team Pager [phone] (8a-5p)

## 2020-05-29 NOTE — Progress Notes (Signed)
CSW attempted to reach Towanda Malkin at [facility] without success - a voicemail was left requesting a return call.  Madilyn Fireman, MSW, LCSW-A Transitions of Care  Clinical Social Worker  [facility] Emergency Departments  Medical ICU [phone]

## 2020-05-29 NOTE — ED Notes (Signed)
Breakfast Ordered 

## 2020-05-29 NOTE — ED Notes (Signed)
Lunch Trays Ordered @ 1101. 

## 2020-05-30 NOTE — Discharge Instructions (Addendum)
Follow up with home health.  Return for new or worsening symptoms

## 2020-05-30 NOTE — Care Management (Addendum)
Referral accepted by [facility] RN, PT, OT services. Start of care should be within 24-48 hours post discharge from the ED.  Appointment also scheduled 8/5 at 4:10 at the [facility] information placed on her AVS.  No further  ED TOC needs identified.

## 2020-05-30 NOTE — Progress Notes (Addendum)
2:25pm: TOC RN CM finalized details for patient's [facility] services, they will begin within 24-48 hours of discharge.  CSW spoke with Britni, [place] regarding patient Nurse, learning disability will arrange for discharge via PTAR once the patient's daughter Caro Hight arrives at home.   10:50am: CSW spoke with patient's daughter to determine if a decision had been made regarding bed offers from [facility] and [facility] continuing to refuse bed offers from those facilities. CSW explained limitations of facilities due to the patient's insurance. [name] willing to accept the patient back at home with [facility] services. [name] stated she does not have a preference for agency.  CSW made referral to [facility] for this patient - currently awaiting a response on acceptance.  Madilyn Fireman, MSW, LCSW-A Transitions of Care  Clinical Social Worker  [facility] Emergency Departments  Medical ICU [phone]

## 2020-05-30 NOTE — ED Notes (Signed)
Patient refused breakfast 

## 2020-05-30 NOTE — ED Notes (Signed)
Lunch Tray Ordered @ 1045 

## 2020-05-30 NOTE — ED Provider Notes (Signed)
Emergency Medicine Observation Re-evaluation Note  Whitney [name] is a 85 y.o. female, seen on rounds today.  Pt initially presented to the ED for complaints of Case Management Currently, the patient is watching TV.  Denies any complaints.  Physical Exam  BP (!) 119/52   Pulse 91   Temp 97.6 F (36.4 C) (Oral)   Resp 16   Ht 5\' 2"  (1.575 m)   Wt 70.3 kg   SpO2 99%   BMI 28.35 kg/m  Physical Exam Vitals and nursing note reviewed.  Constitutional:      General: She is not in acute distress.    Appearance: She is well-developed. She is not diaphoretic.  HENT:     Head: Atraumatic.  Eyes:     Pupils: Pupils are equal, round, and reactive to light.  Cardiovascular:     Rate and Rhythm: Normal rate and regular rhythm.  Pulmonary:     Effort: Pulmonary effort is normal. No respiratory distress.  Abdominal:     General: There is no distension.     Palpations: Abdomen is soft.  Musculoskeletal:        General: Normal range of motion.     Cervical back: Normal range of motion and neck supple.  Skin:    General: Skin is warm and dry.  Neurological:     Mental Status: She is alert.     Comments: Bedbound at baseline    ED Course / MDM  EKG:    I have reviewed the labs performed to date as well as medications administered while in observation.  Recent changes in the last 24 hours include family and patient have declined nursing facility placement. Daughter whom patient lives with requesting dc home with Home health.  Plan  Current plan is for Home health. SW contacting daughter for transportation. Family and  Patient bed bound at baseline. Patient is not under full IVC at this time.   Family has declined inpatient management at this time.  They are requesting DC home with home health services.  Face-to-face orders placed per SW.  Daughter will be home by 3 PM today.  We will plan on PTAR to take her home.  Per, social work home health will reach out to patient's daughter to  plan on visit and to assess for needs.  The patient has been appropriately medically screened and/or stabilized in the ED. I have low suspicion for any other emergent medical condition which would require further screening, evaluation or treatment in the ED or require inpatient management.  Patient is hemodynamically stable and in no acute distress.  Patient able to ambulate in department prior to ED.  Evaluation does not show acute pathology that would require ongoing or additional emergent interventions while in the emergency department or further inpatient treatment.  I have discussed the diagnosis with the patient and answered all questions.  Pain is been managed while in the emergency department and patient has no further complaints prior to discharge.  Patient is comfortable with plan discussed in room and is stable for discharge at this time.  I have discussed strict return precautions for returning to the emergency department.  Patient was encouraged to follow-up with PCP/specialist refer to at discharge.     Lorrie Gargan A, PA-C 05/30/20 [place], Ankit, MD 06/07/20 0740

## 2020-05-30 NOTE — ED Notes (Signed)
Patient asleep.

## 2020-06-02 ENCOUNTER — Telehealth: Payer: Self-pay | Admitting: Physician Assistant

## 2020-06-02 NOTE — Telephone Encounter (Signed)
Returned called Sharlyne Pacas orders were given as instructed by MD Karle Plumber.

## 2020-06-02 NOTE — Telephone Encounter (Signed)
DONITA L ODOM calling from [facility] is calling for an order for Nursing orders to see the patient one time a week for 9 weeks. There are 2 areas on her right lower leg righting an order to apply wash with soap & water and apply allevyn - a foam dressing- 2 times a week.B- [phone]  Please advise verbal on VM [phone]

## 2020-06-15 ENCOUNTER — Inpatient Hospital Stay: Payer: 59 | Admitting: Physician Assistant

## 2020-08-17 ENCOUNTER — Encounter (HOSPITAL_COMMUNITY): Payer: Self-pay

## 2020-08-17 ENCOUNTER — Emergency Department (HOSPITAL_COMMUNITY): Payer: 59

## 2020-08-17 ENCOUNTER — Inpatient Hospital Stay (HOSPITAL_COMMUNITY)
Admission: EM | Admit: 2020-08-17 | Discharge: 2020-09-01 | DRG: 853 | Disposition: A | Discharge status: Skilled Nursing Facility | Payer: 59 | Attending: Internal Medicine | Admitting: Internal Medicine

## 2020-08-17 DIAGNOSIS — N3001 Acute cystitis with hematuria: Secondary | ICD-10-CM

## 2020-08-17 DIAGNOSIS — E1122 Type 2 diabetes mellitus with diabetic chronic kidney disease: Secondary | ICD-10-CM | POA: Diagnosis present

## 2020-08-17 DIAGNOSIS — E11649 Type 2 diabetes mellitus with hypoglycemia without coma: Secondary | ICD-10-CM | POA: Diagnosis not present

## 2020-08-17 DIAGNOSIS — R6521 Severe sepsis with septic shock: Secondary | ICD-10-CM | POA: Diagnosis present

## 2020-08-17 DIAGNOSIS — Z66 Do not resuscitate: Secondary | ICD-10-CM | POA: Diagnosis present

## 2020-08-17 DIAGNOSIS — R609 Edema, unspecified: Secondary | ICD-10-CM | POA: Diagnosis not present

## 2020-08-17 DIAGNOSIS — M25552 Pain in left hip: Secondary | ICD-10-CM

## 2020-08-17 DIAGNOSIS — R112 Nausea with vomiting, unspecified: Secondary | ICD-10-CM | POA: Diagnosis present

## 2020-08-17 DIAGNOSIS — D631 Anemia in chronic kidney disease: Secondary | ICD-10-CM | POA: Diagnosis present

## 2020-08-17 DIAGNOSIS — E872 Acidosis: Secondary | ICD-10-CM | POA: Diagnosis present

## 2020-08-17 DIAGNOSIS — Z20822 Contact with and (suspected) exposure to covid-19: Secondary | ICD-10-CM | POA: Diagnosis not present

## 2020-08-17 DIAGNOSIS — G8929 Other chronic pain: Secondary | ICD-10-CM | POA: Diagnosis present

## 2020-08-17 DIAGNOSIS — R140 Abdominal distension (gaseous): Secondary | ICD-10-CM | POA: Diagnosis not present

## 2020-08-17 DIAGNOSIS — E8770 Fluid overload, unspecified: Secondary | ICD-10-CM | POA: Diagnosis not present

## 2020-08-17 DIAGNOSIS — N12 Tubulo-interstitial nephritis, not specified as acute or chronic: Secondary | ICD-10-CM | POA: Diagnosis not present

## 2020-08-17 DIAGNOSIS — E8809 Other disorders of plasma-protein metabolism, not elsewhere classified: Secondary | ICD-10-CM | POA: Diagnosis not present

## 2020-08-17 DIAGNOSIS — Z7401 Bed confinement status: Secondary | ICD-10-CM | POA: Diagnosis not present

## 2020-08-17 DIAGNOSIS — D72829 Elevated white blood cell count, unspecified: Secondary | ICD-10-CM

## 2020-08-17 DIAGNOSIS — K5900 Constipation, unspecified: Secondary | ICD-10-CM | POA: Diagnosis present

## 2020-08-17 DIAGNOSIS — R339 Retention of urine, unspecified: Secondary | ICD-10-CM | POA: Diagnosis present

## 2020-08-17 DIAGNOSIS — B961 Klebsiella pneumoniae [K. pneumoniae] as the cause of diseases classified elsewhere: Secondary | ICD-10-CM | POA: Diagnosis not present

## 2020-08-17 DIAGNOSIS — M7989 Other specified soft tissue disorders: Secondary | ICD-10-CM | POA: Diagnosis not present

## 2020-08-17 DIAGNOSIS — Z96643 Presence of artificial hip joint, bilateral: Secondary | ICD-10-CM | POA: Diagnosis present

## 2020-08-17 DIAGNOSIS — M79609 Pain in unspecified limb: Secondary | ICD-10-CM | POA: Diagnosis not present

## 2020-08-17 DIAGNOSIS — E860 Dehydration: Secondary | ICD-10-CM | POA: Diagnosis present

## 2020-08-17 DIAGNOSIS — A4159 Other Gram-negative sepsis: Secondary | ICD-10-CM | POA: Diagnosis present

## 2020-08-17 DIAGNOSIS — E1165 Type 2 diabetes mellitus with hyperglycemia: Secondary | ICD-10-CM | POA: Diagnosis present

## 2020-08-17 DIAGNOSIS — Z794 Long term (current) use of insulin: Secondary | ICD-10-CM | POA: Diagnosis not present

## 2020-08-17 DIAGNOSIS — E46 Unspecified protein-calorie malnutrition: Secondary | ICD-10-CM | POA: Diagnosis not present

## 2020-08-17 DIAGNOSIS — R600 Localized edema: Secondary | ICD-10-CM | POA: Diagnosis not present

## 2020-08-17 DIAGNOSIS — N1832 Chronic kidney disease, stage 3b: Secondary | ICD-10-CM | POA: Diagnosis present

## 2020-08-17 DIAGNOSIS — L8990 Pressure ulcer of unspecified site, unspecified stage: Secondary | ICD-10-CM | POA: Insufficient documentation

## 2020-08-17 DIAGNOSIS — Z79899 Other long term (current) drug therapy: Secondary | ICD-10-CM

## 2020-08-17 DIAGNOSIS — A419 Sepsis, unspecified organism: Secondary | ICD-10-CM | POA: Diagnosis not present

## 2020-08-17 DIAGNOSIS — N10 Acute tubulo-interstitial nephritis: Secondary | ICD-10-CM | POA: Diagnosis not present

## 2020-08-17 DIAGNOSIS — N3080 Other cystitis without hematuria: Secondary | ICD-10-CM

## 2020-08-17 DIAGNOSIS — M25559 Pain in unspecified hip: Secondary | ICD-10-CM | POA: Diagnosis not present

## 2020-08-17 DIAGNOSIS — K6289 Other specified diseases of anus and rectum: Secondary | ICD-10-CM | POA: Diagnosis not present

## 2020-08-17 DIAGNOSIS — R7881 Bacteremia: Secondary | ICD-10-CM

## 2020-08-17 DIAGNOSIS — T383X6A Underdosing of insulin and oral hypoglycemic [antidiabetic] drugs, initial encounter: Secondary | ICD-10-CM | POA: Diagnosis present

## 2020-08-17 DIAGNOSIS — J90 Pleural effusion, not elsewhere classified: Secondary | ICD-10-CM | POA: Diagnosis not present

## 2020-08-17 DIAGNOSIS — N179 Acute kidney failure, unspecified: Secondary | ICD-10-CM | POA: Diagnosis present

## 2020-08-17 DIAGNOSIS — Z419 Encounter for procedure for purposes other than remedying health state, unspecified: Secondary | ICD-10-CM

## 2020-08-17 DIAGNOSIS — N183 Chronic kidney disease, stage 3 unspecified: Secondary | ICD-10-CM | POA: Diagnosis not present

## 2020-08-17 LAB — URINALYSIS, ROUTINE W REFLEX MICROSCOPIC
Bilirubin Urine: NEGATIVE
Glucose, UA: >=500 mg/dL — AB
Ketones, ur: NEGATIVE mg/dL
Nitrite: NEGATIVE
Protein, ur: 30 mg/dL — AB
RBC / HPF: >50 RBC/hpf — ABNORMAL HIGH (ref 0–5)
Specific Gravity, Urine: 1.014 (ref 1.005–1.030)
WBC, UA: >50 WBC/hpf — ABNORMAL HIGH (ref 0–5)
pH: 5 (ref 5.0–8.0)

## 2020-08-17 LAB — COMPREHENSIVE METABOLIC PANEL
ALT: 16 U/L (ref 0–44)
ALT: 12 U/L (ref 0–44)
AST: 32 U/L (ref 15–41)
AST: 27 U/L (ref 15–41)
Albumin: 1.3 g/dL — ABNORMAL LOW (ref 3.5–5.0)
Albumin: 1.2 g/dL — ABNORMAL LOW (ref 3.5–5.0)
Alkaline Phosphatase: 178 U/L — ABNORMAL HIGH (ref 38–126)
Alkaline Phosphatase: 160 U/L — ABNORMAL HIGH (ref 38–126)
Anion gap: 10 (ref 5–15)
Anion gap: 10 (ref 5–15)
BUN: 53 mg/dL — ABNORMAL HIGH (ref 8–23)
BUN: 49 mg/dL — ABNORMAL HIGH (ref 8–23)
CO2: 19 mmol/L — ABNORMAL LOW (ref 22–32)
CO2: 17 mmol/L — ABNORMAL LOW (ref 22–32)
Calcium: 7.8 mg/dL — ABNORMAL LOW (ref 8.9–10.3)
Calcium: 7.4 mg/dL — ABNORMAL LOW (ref 8.9–10.3)
Chloride: 106 mmol/L (ref 98–111)
Chloride: 108 mmol/L (ref 98–111)
Creatinine, Ser: 2.16 mg/dL — ABNORMAL HIGH (ref 0.44–1.00)
Creatinine, Ser: 2.03 mg/dL — ABNORMAL HIGH (ref 0.44–1.00)
GFR calc non Af Amer: 21 mL/min — ABNORMAL LOW (ref >60)
GFR calc non Af Amer: 22 mL/min — ABNORMAL LOW (ref >60)
Glucose, Bld: 431 mg/dL — ABNORMAL HIGH (ref 70–99)
Glucose, Bld: 431 mg/dL — ABNORMAL HIGH (ref 70–99)
Potassium: 4.8 mmol/L (ref 3.5–5.1)
Potassium: 4.6 mmol/L (ref 3.5–5.1)
Sodium: 135 mmol/L (ref 135–145)
Sodium: 135 mmol/L (ref 135–145)
Total Bilirubin: 1.8 mg/dL — ABNORMAL HIGH (ref 0.3–1.2)
Total Bilirubin: 1.5 mg/dL — ABNORMAL HIGH (ref 0.3–1.2)
Total Protein: 6.6 g/dL (ref 6.5–8.1)
Total Protein: 6.1 g/dL — ABNORMAL LOW (ref 6.5–8.1)

## 2020-08-17 LAB — RESPIRATORY PANEL BY RT PCR (FLU A&B, COVID)
Influenza A by PCR: NEGATIVE
Influenza B by PCR: NEGATIVE
SARS Coronavirus 2 by RT PCR: NEGATIVE

## 2020-08-17 LAB — CBC WITH DIFFERENTIAL/PLATELET
Abs Immature Granulocytes: 0.28 10*3/uL — ABNORMAL HIGH (ref 0.00–0.07)
Basophils Absolute: 0.1 10*3/uL (ref 0.0–0.1)
Basophils Relative: 1 %
Eosinophils Absolute: 0 10*3/uL (ref 0.0–0.5)
Eosinophils Relative: 0 %
HCT: 36.1 % (ref 36.0–46.0)
Hemoglobin: 11.2 g/dL — ABNORMAL LOW (ref 12.0–15.0)
Immature Granulocytes: 1 %
Lymphocytes Relative: 2 %
Lymphs Abs: 0.4 10*3/uL — ABNORMAL LOW (ref 0.7–4.0)
MCH: 29.8 pg (ref 26.0–34.0)
MCHC: 31 g/dL (ref 30.0–36.0)
MCV: 96 fL (ref 80.0–100.0)
Monocytes Absolute: 0.9 10*3/uL (ref 0.1–1.0)
Monocytes Relative: 4 %
Neutro Abs: 19.9 10*3/uL — ABNORMAL HIGH (ref 1.7–7.7)
Neutrophils Relative %: 92 %
Platelets: 156 10*3/uL (ref 150–400)
RBC: 3.76 MIL/uL — ABNORMAL LOW (ref 3.87–5.11)
RDW: 15 % (ref 11.5–15.5)
WBC: 21.7 10*3/uL — ABNORMAL HIGH (ref 4.0–10.5)
nRBC: 0 % (ref 0.0–0.2)

## 2020-08-17 LAB — CBG MONITORING, ED
Glucose-Capillary: 357 mg/dL — ABNORMAL HIGH (ref 70–99)
Glucose-Capillary: 330 mg/dL — ABNORMAL HIGH (ref 70–99)

## 2020-08-17 LAB — LACTIC ACID, PLASMA
Lactic Acid, Venous: 2.8 mmol/L (ref 0.5–1.9)
Lactic Acid, Venous: 4 mmol/L (ref 0.5–1.9)

## 2020-08-17 LAB — LIPASE, BLOOD
Lipase: 17 U/L (ref 11–51)
Lipase: 19 U/L (ref 11–51)

## 2020-08-17 LAB — TROPONIN I (HIGH SENSITIVITY)
Troponin I (High Sensitivity): 15 ng/L (ref <18)
Troponin I (High Sensitivity): 14 ng/L (ref <18)

## 2020-08-17 LAB — BRAIN NATRIURETIC PEPTIDE: B Natriuretic Peptide: 187.7 pg/mL — ABNORMAL HIGH (ref 0.0–100.0)

## 2020-08-17 MED ORDER — SODIUM CHLORIDE 0.9 % IV BOLUS
1000.0000 mL | Freq: Once | INTRAVENOUS | Status: AC
  Administered 2020-08-17: 1000 mL via INTRAVENOUS

## 2020-08-17 MED ORDER — LACTATED RINGERS IV BOLUS: 500.0000 mL | Freq: Once | INTRAVENOUS | Status: DC

## 2020-08-17 MED ORDER — ENOXAPARIN SODIUM 30 MG/0.3ML ~~LOC~~ SOLN: 30.0000 mg | SUBCUTANEOUS | Status: DC

## 2020-08-17 MED ORDER — METRONIDAZOLE IN NACL 5-0.79 MG/ML-% IV SOLN
500.0000 mg | Freq: Once | INTRAVENOUS | Status: AC
  Administered 2020-08-17: 500 mg via INTRAVENOUS
  Filled 2020-08-17: qty 100

## 2020-08-17 MED ORDER — ONDANSETRON HCL 4 MG PO TABS: 4.0000 mg | ORAL_TABLET | Freq: Four times a day (QID) | ORAL | Status: DC | PRN

## 2020-08-17 MED ORDER — VANCOMYCIN HCL 750 MG/150ML IV SOLN: 750.0000 mg | INTRAVENOUS | Status: DC

## 2020-08-17 MED ORDER — INSULIN ASPART 100 UNIT/ML ~~LOC~~ SOLN
0.0000 [IU] | Freq: Three times a day (TID) | SUBCUTANEOUS | Status: DC
  Administered 2020-08-17: 7 [IU] via SUBCUTANEOUS
  Administered 2020-08-18: 3 [IU] via SUBCUTANEOUS
  Administered 2020-08-18: 2 [IU] via SUBCUTANEOUS
  Administered 2020-08-19: 5 [IU] via SUBCUTANEOUS
  Administered 2020-08-19 (×2): 2 [IU] via SUBCUTANEOUS

## 2020-08-17 MED ORDER — LACTATED RINGERS IV SOLN: INTRAVENOUS | Status: AC

## 2020-08-17 MED ORDER — ALBUMIN HUMAN 5 % IV SOLN
25.0000 g | Freq: Once | INTRAVENOUS | Status: AC
  Administered 2020-08-18: 25 g via INTRAVENOUS
  Filled 2020-08-17: qty 500

## 2020-08-17 MED ORDER — SODIUM CHLORIDE 0.9 % IV SOLN
2.0000 g | Freq: Once | INTRAVENOUS | Status: AC
  Administered 2020-08-17: 2 g via INTRAVENOUS
  Filled 2020-08-17: qty 2

## 2020-08-17 MED ORDER — HEPARIN SODIUM (PORCINE) 5000 UNIT/ML IJ SOLN
5000.0000 [IU] | Freq: Three times a day (TID) | INTRAMUSCULAR | Status: DC
  Administered 2020-08-17 – 2020-08-31 (×42): 5000 [IU] via SUBCUTANEOUS
  Filled 2020-08-17 (×41): qty 1

## 2020-08-17 MED ORDER — FENTANYL CITRATE (PF) 100 MCG/2ML IJ SOLN
50.0000 ug | Freq: Once | INTRAMUSCULAR | Status: AC
  Administered 2020-08-17: 50 ug via INTRAVENOUS
  Filled 2020-08-17: qty 2

## 2020-08-17 MED ORDER — ONDANSETRON HCL 4 MG/2ML IJ SOLN
4.0000 mg | Freq: Once | INTRAMUSCULAR | Status: AC
  Administered 2020-08-17: 4 mg via INTRAVENOUS
  Filled 2020-08-17: qty 2

## 2020-08-17 MED ORDER — LACTATED RINGERS IV BOLUS
1000.0000 mL | Freq: Once | INTRAVENOUS | Status: AC
  Administered 2020-08-17: 1000 mL via INTRAVENOUS

## 2020-08-17 MED ORDER — VANCOMYCIN HCL IN DEXTROSE 1-5 GM/200ML-% IV SOLN
1000.0000 mg | Freq: Once | INTRAVENOUS | Status: AC
  Administered 2020-08-17: 1000 mg via INTRAVENOUS
  Filled 2020-08-17: qty 200

## 2020-08-17 MED ORDER — ONDANSETRON HCL 4 MG/2ML IJ SOLN
4.0000 mg | Freq: Four times a day (QID) | INTRAMUSCULAR | Status: DC | PRN
  Administered 2020-08-18: 4 mg via INTRAVENOUS
  Filled 2020-08-17 (×2): qty 2

## 2020-08-17 MED ORDER — INSULIN GLARGINE 100 UNIT/ML ~~LOC~~ SOLN
3.0000 [IU] | Freq: Every day | SUBCUTANEOUS | Status: DC
  Administered 2020-08-17 – 2020-08-19 (×2): 3 [IU] via SUBCUTANEOUS
  Filled 2020-08-17 (×3): qty 0.03

## 2020-08-17 MED ORDER — SODIUM CHLORIDE 0.9 % IV SOLN
2.0000 g | INTRAVENOUS | Status: DC
  Filled 2020-08-17: qty 2

## 2020-08-17 NOTE — ED Notes (Signed)
Attempted giving report x1.

## 2020-08-17 NOTE — ED Notes (Signed)
MD notified of pressures and ongoing events. [place] closely at present

## 2020-08-17 NOTE — ED Notes (Signed)
Patient transported to X-ray 

## 2020-08-17 NOTE — Progress Notes (Signed)
Patient had drop in blood pressure after initial assessment .  Discussed with patient's RN and patient has been started on a liter bolus ordered.  We will continue to aggressively replete fluids to 30cc/kg as patient appears to be in sepsis.  Checked on patient and BP improving.  107/46. RN will continue to monitor and update team. Continuing broad spectrum antibiotics.

## 2020-08-17 NOTE — Progress Notes (Signed)
Pharmacy Antibiotic Note  Whitney [name] is a 85 y.o. female admitted on 08/17/2020 with sepsis.  Pharmacy has been consulted for vancomycin and cefepime dosing.  Plan: Vancomycin 1000 mg IV x 1, then 750 mg IV every 48 hours (target vancomycin trough 15-20) Cefepime 2 g IV every 24 hours Monitor renal function, Cx/PCR and clinical progression to narrow Vancomycin trough at steady state  Height: 5\' 3"  (160 cm) Weight: 54.4 kg (120 lb) IBW/kg (Calculated) : 52.4  Temp (24hrs), Avg:98 F (36.7 C), Min:98 F (36.7 C), Max:98 F (36.7 C)  Recent Labs  Lab 08/17/20 0851  WBC 21.7*    CrCl cannot be calculated (Patient's most recent lab result is older than the maximum 21 days allowed.).    No Known Allergies  Antimicrobials this admission: Vanc 10/7>> Cefepime 10/7>>  Dose adjustments this admission: n/a  Microbiology results: 10/7 BCx: sent 10/7 UCx: sent 10/7 Cdiff PCR: sent  Bertis Ruddy, PharmD Clinical Pharmacist ED Pharmacist Phone # [phone] 08/17/2020 9:52 AM

## 2020-08-17 NOTE — H&P (Addendum)
Date: 08/17/2020               Patient Name:  Whitney [name] MRN: [id number]  DOB: December 19, 1934 Age / Sex: 85 y.o., female   PCP: Patient, No Pcp Per              Medical Service: Internal Medicine Teaching Service              Attending Physician: Dr. Aldine Contes, MD    First Contact: Jacklynn Bue, MS3 Pager: [phone]  Second Contact: Dr. Cato Mulligan  Pager: [phone]  Third Contact Dr. [name]  Pager: [phone]       After Hours (After 5p/  First Contact Pager: [phone]  weekends / holidays): Second Contact Pager: [phone]   Chief Complaint: Nausea, vomiting, abdominal pain  History of Present Illness:  Whitney [name] is 85 yo female with a medical history of insulin-dependent diabetes, CKD stage III, and chronic right hip fracture that presented to [facility] with 3 week history of episodic nausea and vomiting. History reported by daughter: Reports that the patient started experiencing nausea, non-bloody/non-bilious vomiting and diarrhea 3 weeks ago that self-resolved 2 days later. She reports that these episodes have occurred every 2-3 days or so since then. Patient is bed-bound at baseline and received a home visit from [facility] that was concerned for C.Diff and sent a kit for a stool sample. Symptoms returned yesterday evening and continued throughout the night and daughter called EMS. Reports that patient had been acting like normal self. Last meal was at noon yesterday, and she has not taken her Lantus 6units in 3 days. She also reports that the patient started complaining of burning urination over the last 2 days. No known sick contacts. She denies any fevers. She denies complaints of chest pain or dyspnea, hematemesis, hematochezia or melena.   ED course:  On arrival from EMS,  patient's vitals were BP 94/61 P113 Temp 54F RR20 O2sat 92% on Room Air. Labs were significant for WBC 21.7, Hbg 11.2 with , Lactic Acid 4.0>2.8, BNP 187.7, Glucose 431, Creatinine 2.16, BUN 53, Ca2+ 7.8.  UA was significant for glucosuria positive for leukocytes, negative for nitrites.  Patient was given IV fluid bolus with response and started on broad spectrum antibiotics of Flaygl, Cefepime and Vancomycin. 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 She was admitted to IMTS for further evaluation and management of her condition.  Meds:  Current Meds  Medication Sig  . calcitonin, salmon, (MIACALCIN/FORTICAL) 200 UNIT/ACT nasal spray Place 1 spray into alternate nostrils daily.  Marland Kitchen ibuprofen (ADVIL) 200 MG tablet Take 200-400 mg by mouth daily as needed for headache (pain).  . insulin glargine (LANTUS) 100 UNIT/ML Solostar Pen Inject 5 Units into the skin at bedtime. (Patient taking differently: Inject 6 Units into the skin at bedtime. )  . Multiple Vitamin (MULTIVITAMIN WITH MINERALS) TABS tablet Take 1 tablet by mouth daily.  . Vitamin D, Ergocalciferol, (DRISDOL) 1.25 MG (50000 UNIT) CAPS capsule Take 1 capsule (50,000 Units total) by mouth every 7 (seven) days. (Patient taking differently: Take 50,000 Units by mouth every 7 (seven) days. Sunday)   Allergies: Allergies as of 08/17/2020  . (No Known Allergies)   Past Medical History:  Diagnosis Date  . Diabetes mellitus without complication (HCC)    Family History: Per daughter, no family history of stroke, heart disease or cancer in the family.  Social History: Whitney [name] is from [place] and moved to [place] to live with  her daughter.She is bed bound, and receives PACE.   Review of Systems: A complete ROS was negative except as per HPI.   Physical Exam: Blood pressure (!) 111/44, pulse (!) 107, temperature 98.2 F (36.8 C), temperature source Oral, resp. rate (!) 24, height 5' 3"  (1.6 m), weight 54.4 kg, SpO2 99 %.   Physical Exam Constitutional:      General: She is awake.     Appearance: She is ill-appearing.     Comments: Lying in bed, dry heaving with clear vomitus  HENT:     Head: Normocephalic and  atraumatic.     Mouth/Throat:     Mouth: Mucous membranes are dry.  Eyes:     Comments: Clear tearing from eyes bilaterally  Cardiovascular:     Rate and Rhythm: Tachycardia present.     Pulses: Decreased pulses.     Heart sounds: Normal heart sounds.     Comments: Weak pulses bilaterally  Pulmonary:     Effort: Pulmonary effort is normal. No respiratory distress.     Breath sounds: Normal breath sounds.  Abdominal:     General: Abdomen is flat. Bowel sounds are normal.     Palpations: Abdomen is soft.     Tenderness: There is abdominal tenderness in the suprapubic area and left lower quadrant. There is no guarding.     Comments: Tenderness to palpation in suprapubic and LLQ   Musculoskeletal:        General: Swelling present.     Right lower leg: 3+ Edema present.     Left lower leg: 3+ Edema present.     Comments: Generalized weakness, unable to move extemities  Skin:    Findings: Bruising and lesion present.  Neurological:     Mental Status: She is oriented to person, place, and time. Mental status is at baseline.    Assessment & Plan by Problem: Active Problems:   Sepsis ([place])  [name] is 85 yo female with a medical history of insulin-dependent diabetes, CKD stage III, and chronic right hip fracture that presented to [facility] with 3 week history of episodic nausea and vomiting, tachycardia, hypotension, hyperglycemia, elevated lactate and UA significant for glucosuria and pyuria admitted to IMTS for evaluation and workup.  #Sepsis 2/2 cystitis and left-sided pyelitis, active Patient presented with worsening nausea/vomiting and complaints of dysuria with suprapubic tenderness on physical exam. She was hypotensive and tachycardic on presentation. CBC was significant for leukocytosis, UA was positive for leukocytes of 21.1, Urine culture pending.CT AP consistent with emphysematous cystitis and pyelitis. Patient started on Flagyl, Cefepime and Vancomycin.  --IV LR bolus  1057m --continue Flagyl, Cefepime and Vancomycin --Zofran 410mprn  --Urine Culture pending --Blood cultures pending --repeat Lactic Acid --Mag lab --Phos lab  #Acute on Chronic Kidney Disease Stage III, active Patient with a history of CKDIII presented with Creatinine of 2.16, 1.3 at baseline. Most likely prerenal due to dehydration from vomiting and lack of oral intake. We will continue with IVF and monitor renal function and electrolytes. -IVF -CMP tomorrow morning  #Hyperglycemia, active #T2DM, chronic Patient has a history of insulin-dependent DM2. Daughter reports that she normally takes Lantus 6 units at night, but hasn't taking in the last 3 days. Blood Glucose on arrival was 431 with normal anion gap. --HbA1c --CBG monitoring --SSI sensitive  --Lantus 3 units QHS  Code status: Full Code IVF: LR VTE ppx: Heparin Diet: NPO Bowel Regimen: None  Dispo: Admit patient to Inpatient with expected length of stay greater  than 2 midnights.  Signed: Jacklynn Bue, MS3 Pager: [phone]  Attestation for Student Documentation:  I personally was present and performed or re-performed the history, physical exam and medical decision-making activities of this service and have verified that the service and findings are accurately documented in the student's note.  Cato Mulligan, MD 08/17/2020, 6:36 PM

## 2020-08-17 NOTE — ED Notes (Signed)
Attempted to give report a second time. Was told by 6N that the nurse was unable to take report at this time. This RN asked to speak with CN to give report and was told that CN has a full assignment. This RN has made the nurse aware that bedside report will be given.

## 2020-08-17 NOTE — ED Notes (Signed)
Md Wynetta Emery assessing Pt

## 2020-08-17 NOTE — ED Notes (Signed)
Md assessed Pt in room

## 2020-08-17 NOTE — ED Notes (Signed)
Off floor for imaging 

## 2020-08-17 NOTE — Progress Notes (Signed)
Went to evaluate following 2L LR bolus, BP mildly improved around 102/52 with MAP 66. Patient was sleeping on arrival, daughter at bedside. Daughter reported patient looked comfortable.  Patient woke up when daughter was leaving and denied any complaints. She was alert and able to answer daughters questions. Knows how to call nurse if needed. Patient appeared comfortable on exam, NAD, tachycardic, no murmurs noted.  Discussed that we will continue giving her fluids, that her blood pressure has been borderline and that she may require pressor support in the future if it drops or does not improve. Discussed that we will keep a close check on her blood pressures overnight.   -1L LR bolus -Maintenance fluids following bolus -Goal MAP >65, if BP decreases will consult PCCM for pressor support -F/u lactic acid, CBC, mag, phos, CMP -Continue antibiotics for sepsis 2/2 cystitis and left pyelitis

## 2020-08-17 NOTE — ED Provider Notes (Addendum)
[facility] EMERGENCY DEPARTMENT Provider Note   CSN: [id number] Arrival date & time: 08/17/20  4268     History Chief Complaint  Patient presents with  . Diarrhea    Whitney [name] is a 85 y.o. female.    History obtained from patient, daughter, EMS report.  Daughter reports that she has been generally ill for the last couple weeks.  Has noted some diarrhea, reports her primary doctor was concerned that she may have C. difficile but they did not complete testing.  Over the last couple days the patient started having vomiting, multiple times a day, nonbloody nonbilious, clear mostly.  She reports that her mother has also been complaining of abdominal pain.  Temperature up to 37.4 C.  Chills for the last couple days as well.  Bedbound at baseline.  Patient has durable DNR.  Per chart review, significant deconditioning, bedbound, CKD, diabetes, malnutrition.    HPI     Past Medical History:  Diagnosis Date  . Diabetes mellitus without complication [facility])     Patient Active Problem List   Diagnosis Date Noted  . Dehydration   . Pain   . Goals of care, counseling/discussion   . Advanced care planning/counseling discussion   . Palliative care by specialist   . Closed right hip fracture, initial encounter ([place]) 03/03/2020  . Diabetes mellitus without complication ([place])   . CKD (chronic kidney disease), stage III ([place])   . Closed right hip fracture ([place])   . Protein calorie malnutrition ([place])   . Bilateral leg edema   . Intractable pain 03/02/2020    Past Surgical History:  Procedure Laterality Date  . TOTAL HIP ARTHROPLASTY Right 03/05/2020   Procedure: HIP GIRDLE STONE;  Surgeon: Leandrew Koyanagi, MD;  Location: [place];  Service: Orthopedics;  Laterality: Right;     OB History   No obstetric history on file.     No family history on file.  Social History   Tobacco Use  . Smoking status: Never Smoker  . Smokeless tobacco: Never Used  Substance  Use Topics  . Alcohol use: Never  . Drug use: Never    Home Medications Prior to Admission medications   Medication Sig Start Date End Date Taking? Authorizing Provider  calcitonin, salmon, (MIACALCIN/FORTICAL) 200 UNIT/ACT nasal spray Place 1 spray into alternate nostrils daily. 03/30/20  Yes Vann, Jessica U, DO  ibuprofen (ADVIL) 200 MG tablet Take 200-400 mg by mouth daily as needed for headache (pain).   Yes [provider]  insulin glargine (LANTUS) 100 UNIT/ML Solostar Pen Inject 5 Units into the skin at bedtime. Patient taking differently: Inject 6 Units into the skin at bedtime.  04/12/20  Yes Argentina Donovan, PA-C  Multiple Vitamin (MULTIVITAMIN WITH MINERALS) TABS tablet Take 1 tablet by mouth daily.   Yes [provider]  Vitamin D, Ergocalciferol, (DRISDOL) 1.25 MG (50000 UNIT) CAPS capsule Take 1 capsule (50,000 Units total) by mouth every 7 (seven) days. Patient taking differently: Take 50,000 Units by mouth every 7 (seven) days. Sunday 03/30/20  Yes Geradine Girt, DO  acetaminophen (TYLENOL) 500 MG tablet Take 2 tablets by mouth every 8 (eight) hours as needed for moderate pain. Patient not taking: Reported on 05/27/2020 02/28/20   [provider]  famotidine (PEPCID) 20 MG tablet Take 1 tablet (20 mg total) by mouth 2 (two) times daily. Patient not taking: Reported on 05/08/2020 03/30/20   Geradine Girt, DO  Insulin Pen Needle (PEN NEEDLES)  30G X 5 MM MISC Use as instructed. 04/26/20   Argentina Donovan, PA-C  oxyCODONE-acetaminophen (PERCOCET) 5-325 MG tablet Take 1-2 tablets by mouth every 8 (eight) hours as needed for severe pain. Patient not taking: Reported on 04/12/2020 03/05/20   Leandrew Koyanagi, MD    Allergies    Patient has no known allergies.  Review of Systems   Review of Systems  Constitutional: Positive for chills and fever.  HENT: Negative for ear pain and sore throat.   Eyes: Negative for pain and visual disturbance.  Respiratory:  Negative for cough and shortness of breath.   Cardiovascular: Negative for chest pain and palpitations.  Gastrointestinal: Positive for abdominal pain, diarrhea, nausea and vomiting.  Genitourinary: Negative for dysuria and hematuria.  Musculoskeletal: Negative for arthralgias and back pain.  Skin: Negative for color change and rash.  Neurological: Negative for seizures and syncope.  All other systems reviewed and are negative.   Physical Exam Updated Vital Signs BP (!) 143/64   Pulse (!) 102   Temp 98.5 F (36.9 C) (Rectal)   Resp 14   Ht 5\' 3"  (1.6 m)   Wt 54.4 kg   SpO2 100%   BMI 21.26 kg/m   Physical Exam Vitals and nursing note reviewed.  Constitutional:      General: She is not in acute distress.    Appearance: She is well-developed.     Comments: Chronically ill-appearing, no acute distress  HENT:     Head: Normocephalic and atraumatic.  Eyes:     Conjunctiva/sclera: Conjunctivae normal.  Cardiovascular:     Rate and Rhythm: Regular rhythm. Tachycardia present.     Heart sounds: No murmur heard.   Pulmonary:     Effort: Pulmonary effort is normal. No respiratory distress.     Breath sounds: Normal breath sounds.  Abdominal:     General: Abdomen is flat.     Palpations: Abdomen is soft.     Tenderness: There is abdominal tenderness.     Comments: Generalized tenderness to palpation, no rebound or guarding  Musculoskeletal:     Cervical back: Neck supple.     Comments: Chronic deformity to left hip, right hip, chronic deformity to left shoulder  Skin:    General: Skin is warm and dry.  Neurological:     General: No focal deficit present.     Mental Status: She is alert and oriented to person, place, and time.     ED Results / Procedures / Treatments   Labs (all labs ordered are listed, but only abnormal results are displayed) Labs Reviewed  CBC WITH DIFFERENTIAL/PLATELET - Abnormal; Notable for the following components:      Result Value   WBC 21.7  (*)    RBC 3.76 (*)    Hemoglobin 11.2 (*)    Neutro Abs 19.9 (*)    Lymphs Abs 0.4 (*)    Abs Immature Granulocytes 0.28 (*)    All other components within normal limits  LACTIC ACID, PLASMA - Abnormal; Notable for the following components:   Lactic Acid, Venous 2.8 (*)    All other components within normal limits  LACTIC ACID, PLASMA - Abnormal; Notable for the following components:   Lactic Acid, Venous 4.0 (*)    All other components within normal limits  BRAIN NATRIURETIC PEPTIDE - Abnormal; Notable for the following components:   B Natriuretic Peptide 187.7 (*)    All other components within normal limits  COMPREHENSIVE METABOLIC PANEL - Abnormal; Notable  for the following components:   CO2 19 (*)    Glucose, Bld 431 (*)    BUN 53 (*)    Creatinine, Ser 2.16 (*)    Calcium 7.8 (*)    Albumin 1.3 (*)    Alkaline Phosphatase 178 (*)    Total Bilirubin 1.8 (*)    GFR calc non Af Amer 21 (*)    All other components within normal limits  COMPREHENSIVE METABOLIC PANEL - Abnormal; Notable for the following components:   CO2 17 (*)    Glucose, Bld 431 (*)    BUN 49 (*)    Creatinine, Ser 2.03 (*)    Calcium 7.4 (*)    Total Protein 6.1 (*)    Albumin 1.2 (*)    Alkaline Phosphatase 160 (*)    Total Bilirubin 1.5 (*)    GFR calc non Af Amer 22 (*)    All other components within normal limits  URINALYSIS, ROUTINE W REFLEX MICROSCOPIC - Abnormal; Notable for the following components:   Color, Urine AMBER (*)    APPearance CLOUDY (*)    Glucose, UA >=500 (*)    Hgb urine dipstick LARGE (*)    Protein, ur 30 (*)    Leukocytes,Ua LARGE (*)    RBC / HPF >50 (*)    WBC, UA >50 (*)    Bacteria, UA MANY (*)    All other components within normal limits  CBG MONITORING, ED - Abnormal; Notable for the following components:   Glucose-Capillary 357 (*)    All other components within normal limits  RESPIRATORY PANEL BY RT PCR (FLU A&B, COVID)  GASTROINTESTINAL PANEL BY PCR, STOOL  (REPLACES STOOL CULTURE)  C DIFFICILE QUICK SCREEN W PCR REFLEX  CULTURE, BLOOD (ROUTINE X 2)  CULTURE, BLOOD (ROUTINE X 2)  URINE CULTURE  LIPASE, BLOOD  LIPASE, BLOOD  TROPONIN I (HIGH SENSITIVITY)  TROPONIN I (HIGH SENSITIVITY)  TROPONIN I (HIGH SENSITIVITY)  TROPONIN I (HIGH SENSITIVITY)    EKG EKG Interpretation  Date/Time:  Thursday August 17 2020 09:00:28 EDT Ventricular Rate:  116 PR Interval:    QRS Duration: 129 QT Interval:  356 QTC Calculation: 495 R Axis:   -56 Text Interpretation: Sinus or ectopic atrial tachycardia RBBB and LAFB ST elevation, consider inferior injury no acute STEMI No acute changes Confirmed by [name] [phone]) on 08/17/2020 9:11:14 AM   Radiology CT ABDOMEN PELVIS WO CONTRAST  Result Date: 08/17/2020 CLINICAL DATA:  Acute onset abdominal pain.  Leukocytosis and fever. EXAM: CT ABDOMEN AND PELVIS WITHOUT CONTRAST TECHNIQUE: Multidetector CT imaging of the abdomen and pelvis was performed following the standard protocol without IV contrast. COMPARISON:  None. FINDINGS: Lower chest: Small bilateral pleural effusions. No pericardial effusion. There is some dependent atelectasis. Mild bronchiectasis in the lower lobes bilaterally. Hepatobiliary: 2 stones are seen in the gallbladder measuring up to 0.8 cm. No gallbladder wall thickening or pericholecystic fluid. Biliary tree and liver appear normal. Pancreas: Marked fatty atrophy of the pancreas.  No focal lesion. Spleen: Normal in size without focal abnormality. Adrenals/Urinary Tract: There is gas in the left intrarenal collecting system and ureter. Gas is also seen within the lumen and wall of the urinary bladder. Left renal cyst is unchanged. The right kidney and ureter are unremarkable. The adrenal glands appear normal. Stomach/Bowel: Stomach is within normal limits. Appendix appears normal. No evidence of bowel wall thickening, distention, or inflammatory changes. Vascular/Lymphatic: No  significant vascular findings are present. No enlarged abdominal or pelvic  lymph nodes. Reproductive: Endometrial stripe appears thickened at 0.9 cm. No adnexal mass. Other: There is body wall edema. Small volume of ascites is present in the abdomen. No focal fluid collection. Musculoskeletal: No acute bony abnormality. Remote lumbar compression fractures are unchanged. The patient is status post left hip replacement. The right femoral head and neck has been resected or have as on the prior exam. 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 Small bilateral pleural effusions, body wall edema and small volume of ascites compatible with volume overload. Gallstones without evidence of cholecystitis. Electronically Signed   By: Inge Rise M.D.   On: 08/17/2020 13:42   DG Chest 1 View  Result Date: 08/17/2020 CLINICAL DATA:  Pain EXAM: CHEST  1 VIEW COMPARISON:  March 09, 2020 FINDINGS: There is scarring with minimal pleural effusion in the left base, stable. Interstitial thickening bilaterally is stable. There is no frank edema or consolidation. There is cardiomegaly with pulmonary vascularity within normal limits. No adenopathy. There is aortic atherosclerosis. Bones are diffusely osteoporotic. Chronic appearing fracture proximal left humerus again noted. IMPRESSION: Scarring with minimal pleural effusion left base. Interstitial thickening may indicate underlying chronic bronchitis. No frank edema or consolidation. Stable cardiomegaly. Bones osteoporotic.  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 Aortic Atherosclerosis (ICD10-I70.0). Electronically Signed   By: Lowella Grip III M.D.   On: 08/17/2020 09:56   DG Humerus Left  Result Date: 08/17/2020 CLINICAL DATA:  Pain EXAM: LEFT HUMERUS - 2+ VIEW COMPARISON:  None. FINDINGS: Frontal and lateral views obtained. There is a suspected chronic comminuted fracture at the left proximal humeral metaphysis with marked medial  displacement and lateral angulation of the distal major fracture fragment with respect proximal major fragment. No dislocation. Bones osteoporotic. There is extensive arthropathy in the left shoulder joint. Visualized left lung clear. : Suspect chronic fracture at the level of the proximal humeral metaphysis on the left with displacement and angulation at the fracture site. Bones are diffusely osteoporotic. No dislocation. Extensive arthropathy left shoulder region. Electronically Signed   By: Lowella Grip III M.D.   On: 08/17/2020 09:55   DG Hip Unilat W or Wo Pelvis 2-3 Views Left  Result Date: 08/17/2020 CLINICAL DATA:  Pain EXAM: DG HIP (WITH OR WITHOUT PELVIS) 2-3V LEFT COMPARISON:  Right hip images March 05, 2020; pelvis image March 02, 2020 FINDINGS: Frontal pelvis and lateral left hip images were obtained. Bones are diffusely osteoporotic. There is a chronic fracture of the right subcapital femoral neck with superior and lateral displacement of the right femoral shaft with respect to the right femoral head. There is a total hip replacement on the left with prosthetic components appearing well-seated. Cement noted medial to the left hip joint. No left-sided hip region fracture is demonstrated. No dislocation on the left. Exostosis arises from the lateral ischium measuring 2.1 x 1.3 cm. IMPRESSION: 1.  Severe osteoporosis. 2. Chronic fracture right subcapital femoral neck region with chronic displacement of the right femoral shaft laterally and superiorly with respect to the right femoral head. 3. Total hip replacement on the left with prosthetic components appearing well-seated. No acute fracture or dislocation on the left side. 4. Exostosis arising from the lateral left ischium measuring 2.1 x 1.3 cm. Electronically Signed   By: Lowella Grip III M.D.   On: 08/17/2020 09:53    Procedures .Critical Care Performed by: Lucrezia Starch, MD Authorized by: Lucrezia Starch, MD   Critical  care provider statement:    Critical care  time (minutes):  43   Critical care was necessary to treat or prevent imminent or life-threatening deterioration of the following conditions:  Sepsis   Critical care was time spent personally by me on the following activities:  Discussions with consultants, evaluation of patient's response to treatment, examination of patient, ordering and performing treatments and interventions, ordering and review of laboratory studies, ordering and review of radiographic studies, pulse oximetry, re-evaluation of patient's condition, obtaining history from patient or surrogate and review of old charts   (including critical care time)  Medications Ordered in ED Medications  ceFEPIme (MAXIPIME) 2 g in sodium chloride 0.9 % 100 mL IVPB (has no administration in time range)  vancomycin (VANCOREADY) IVPB 750 mg/150 mL (has no administration in time range)  ondansetron (ZOFRAN) injection 4 mg (has no administration in time range)  sodium chloride 0.9 % bolus 1,000 mL (0 mLs Intravenous Stopped 08/17/20 1235)  ceFEPIme (MAXIPIME) 2 g in sodium chloride 0.9 % 100 mL IVPB (0 g Intravenous Stopped 08/17/20 1123)  metroNIDAZOLE (FLAGYL) IVPB 500 mg (0 mg Intravenous Stopped 08/17/20 1234)  vancomycin (VANCOCIN) IVPB 1000 mg/200 mL premix (0 mg Intravenous Stopped 08/17/20 1400)  fentaNYL (SUBLIMAZE) injection 50 mcg (50 mcg Intravenous Given 08/17/20 1237)    ED Course  I have reviewed the triage vital signs and the nursing notes.  Pertinent labs & imaging results that were available during my care of the patient were reviewed by me and considered in my medical decision making (see chart for details).  Clinical Course as of Aug 17 1534  Thu Aug 17, 2020  0935 No fever but given profound leukocytosis, will start broad spectrum abx  WBC(!): 21.7 [RD]  0953 Obtained additional history from daughter   [RD]  1014 Lactic Acid, Venous(!!): 2.8 [RD]  1040 rechecked   [RD]     Clinical Course User Index [RD] Lucrezia Starch, MD   MDM Rules/Calculators/A&P                         85 year old lady severely deconditioned, bedbound presents to ER with concern for diarrhea, nausea vomiting and abdominal pain.  Mild tachycardia, mild soft BP, afebrile though daughter reported low-grade fever at home.  Leukocytosis with elevated lactic acidosis, concerning for possible sepsis.  Provided patient fluid bolus and broad spectrum antibiotics.  Remainder of labs were most notable for AKI, UTI.  CT abdomen pelvis demonstrating likely cystitis and pyelitis.  Consult to medicine for admission.  Final Clinical Impression(s) / ED Diagnoses Final diagnoses:  Acute cystitis with hematuria  Pyelitis  Sepsis, due to unspecified organism, unspecified whether acute organ dysfunction present ([place])  Leukocytosis, unspecified type    Rx / DC Orders ED Discharge Orders    None       Lucrezia Starch, MD 08/17/20 1536    Lucrezia Starch, MD 09/04/20 [phone]

## 2020-08-17 NOTE — ED Triage Notes (Addendum)
Pt arrived via [facility] for N/V/Dx2 wks. Per EMS pt's daughter told them she has vomited 5 times since 0200 today. Pt was seen by PCP about 2 wks ago and thinks she has c-diff, no atx were prescribed. Per EMS daughter states pt received second COVID vaccine 2 wks ago then all of these sx started.  Per EMS daughter stated pt left hip and left humerus is possibly broken, but denies pt falling. Pt is bed bound at baseline.

## 2020-08-17 NOTE — Progress Notes (Addendum)
Evaluated patient for continued hypotension despite 3x 1L boluses of LR. MAP has been hovering around 65, most recently was 60. BP was 107/45. She continues to be alert and does not endorse any complaints at this time, appears comfortable. On exam bilateral LE pitting edema, tachycardia, decreased lung sounds.  Spoke with PCCM who advised that we administer 500cc of lactated albumin and recheck LA, then call back. The above has been ordered, will continue to follow closely.   Plan: -s/p 3L LR -maintenance fluids -500cc lactated albumin -Goal MAP >65 -F/u lactic acid, CBC, mag, phos, CMP -Continue antibiotics for sepsis 2/2 cystitis and left pyelitis

## 2020-08-17 NOTE — ED Notes (Signed)
MD finishing EMS

## 2020-08-17 NOTE — ED Notes (Signed)
MD notified ref Afib N/V and and monitor reading

## 2020-08-18 ENCOUNTER — Encounter (HOSPITAL_COMMUNITY): Payer: Self-pay | Admitting: Internal Medicine

## 2020-08-18 ENCOUNTER — Inpatient Hospital Stay (HOSPITAL_COMMUNITY): Payer: 59

## 2020-08-18 ENCOUNTER — Inpatient Hospital Stay (HOSPITAL_COMMUNITY): Payer: 59 | Admitting: Anesthesiology

## 2020-08-18 ENCOUNTER — Encounter (HOSPITAL_COMMUNITY)
Admission: EM | Disposition: A | Discharge status: Skilled Nursing Facility | Payer: Self-pay | Source: Home / Self Care | Attending: Internal Medicine

## 2020-08-18 DIAGNOSIS — R600 Localized edema: Secondary | ICD-10-CM

## 2020-08-18 DIAGNOSIS — E1122 Type 2 diabetes mellitus with diabetic chronic kidney disease: Secondary | ICD-10-CM

## 2020-08-18 DIAGNOSIS — N183 Chronic kidney disease, stage 3 unspecified: Secondary | ICD-10-CM

## 2020-08-18 DIAGNOSIS — N10 Acute tubulo-interstitial nephritis: Secondary | ICD-10-CM

## 2020-08-18 DIAGNOSIS — L8990 Pressure ulcer of unspecified site, unspecified stage: Secondary | ICD-10-CM | POA: Insufficient documentation

## 2020-08-18 DIAGNOSIS — Z794 Long term (current) use of insulin: Secondary | ICD-10-CM

## 2020-08-18 HISTORY — PX: CYSTOSCOPY W/ URETERAL STENT PLACEMENT: SHX1429

## 2020-08-18 LAB — CBC WITH DIFFERENTIAL/PLATELET

## 2020-08-18 LAB — DIFFERENTIAL
Abs Immature Granulocytes: 1.26 10*3/uL — ABNORMAL HIGH (ref 0.00–0.07)
Basophils Absolute: 0.1 10*3/uL (ref 0.0–0.1)
Basophils Relative: 0 %
Eosinophils Absolute: 0.2 10*3/uL (ref 0.0–0.5)
Eosinophils Relative: 1 %
Immature Granulocytes: 4 %
Lymphocytes Relative: 3 %
Lymphs Abs: 0.7 10*3/uL (ref 0.7–4.0)
Monocytes Absolute: 1.1 10*3/uL — ABNORMAL HIGH (ref 0.1–1.0)
Monocytes Relative: 4 %
Neutro Abs: 26.2 10*3/uL — ABNORMAL HIGH (ref 1.7–7.7)
Neutrophils Relative %: 88 %

## 2020-08-18 LAB — BLOOD CULTURE ID PANEL (REFLEXED) - BCID2

## 2020-08-18 LAB — GLUCOSE, CAPILLARY
Glucose-Capillary: 127 mg/dL — ABNORMAL HIGH (ref 70–99)
Glucose-Capillary: 152 mg/dL — ABNORMAL HIGH (ref 70–99)
Glucose-Capillary: 174 mg/dL — ABNORMAL HIGH (ref 70–99)
Glucose-Capillary: 197 mg/dL — ABNORMAL HIGH (ref 70–99)
Glucose-Capillary: 217 mg/dL — ABNORMAL HIGH (ref 70–99)
Glucose-Capillary: 229 mg/dL — ABNORMAL HIGH (ref 70–99)
Glucose-Capillary: 249 mg/dL — ABNORMAL HIGH (ref 70–99)

## 2020-08-18 LAB — CBC
HCT: 29.2 % — ABNORMAL LOW (ref 36.0–46.0)
Hemoglobin: 9.1 g/dL — ABNORMAL LOW (ref 12.0–15.0)
MCH: 29.2 pg (ref 26.0–34.0)
MCHC: 31.2 g/dL (ref 30.0–36.0)
MCV: 93.6 fL (ref 80.0–100.0)
Platelets: ADEQUATE 10*3/uL (ref 150–400)
RBC: 3.12 MIL/uL — ABNORMAL LOW (ref 3.87–5.11)
RDW: 15.3 % (ref 11.5–15.5)
WBC: 29.6 10*3/uL — ABNORMAL HIGH (ref 4.0–10.5)
nRBC: 0 % (ref 0.0–0.2)

## 2020-08-18 LAB — HEMOGLOBIN A1C
Hgb A1c MFr Bld: 7.8 % — ABNORMAL HIGH (ref 4.8–5.6)
Mean Plasma Glucose: 177.16 mg/dL

## 2020-08-18 LAB — COMPREHENSIVE METABOLIC PANEL
ALT: 13 U/L (ref 0–44)
AST: 27 U/L (ref 15–41)
Albumin: 1.2 g/dL — ABNORMAL LOW (ref 3.5–5.0)
Alkaline Phosphatase: 146 U/L — ABNORMAL HIGH (ref 38–126)
Anion gap: 9 (ref 5–15)
BUN: 48 mg/dL — ABNORMAL HIGH (ref 8–23)
CO2: 19 mmol/L — ABNORMAL LOW (ref 22–32)
Calcium: 7.5 mg/dL — ABNORMAL LOW (ref 8.9–10.3)
Chloride: 108 mmol/L (ref 98–111)
Creatinine, Ser: 2.07 mg/dL — ABNORMAL HIGH (ref 0.44–1.00)
GFR calc non Af Amer: 22 mL/min — ABNORMAL LOW (ref >60)
Glucose, Bld: 265 mg/dL — ABNORMAL HIGH (ref 70–99)
Potassium: 4.3 mmol/L (ref 3.5–5.1)
Sodium: 136 mmol/L (ref 135–145)
Total Bilirubin: 1.6 mg/dL — ABNORMAL HIGH (ref 0.3–1.2)
Total Protein: 5.7 g/dL — ABNORMAL LOW (ref 6.5–8.1)

## 2020-08-18 LAB — LACTIC ACID, PLASMA
Lactic Acid, Venous: 3.2 mmol/L (ref 0.5–1.9)
Lactic Acid, Venous: 2.3 mmol/L (ref 0.5–1.9)
Lactic Acid, Venous: 1.6 mmol/L (ref 0.5–1.9)
Lactic Acid, Venous: 2.1 mmol/L (ref 0.5–1.9)

## 2020-08-18 LAB — TROPONIN I (HIGH SENSITIVITY): Troponin I (High Sensitivity): 24 ng/L — ABNORMAL HIGH (ref <18)

## 2020-08-18 LAB — PHOSPHORUS: Phosphorus: 3.1 mg/dL (ref 2.5–4.6)

## 2020-08-18 LAB — MAGNESIUM: Magnesium: 1.4 mg/dL — ABNORMAL LOW (ref 1.7–2.4)

## 2020-08-18 SURGERY — CYSTOSCOPY, WITH RETROGRADE PYELOGRAM AND URETERAL STENT INSERTION
Anesthesia: General | Laterality: Left

## 2020-08-18 MED ORDER — CHLORHEXIDINE GLUCONATE 0.12 % MT SOLN
OROMUCOSAL | Status: AC
  Administered 2020-08-18: 15 mL via OROMUCOSAL
  Filled 2020-08-18: qty 15

## 2020-08-18 MED ORDER — FENTANYL CITRATE (PF) 250 MCG/5ML IJ SOLN
INTRAMUSCULAR | Status: DC | PRN
Start: 2020-08-18 — End: 2020-08-18
  Administered 2020-08-18: 50 ug via INTRAVENOUS

## 2020-08-18 MED ORDER — FENTANYL CITRATE (PF) 100 MCG/2ML IJ SOLN: 25.0000 ug | INTRAMUSCULAR | Status: DC | PRN

## 2020-08-18 MED ORDER — IBUPROFEN 200 MG PO TABS: 600.0000 mg | ORAL_TABLET | Freq: Four times a day (QID) | ORAL | Status: DC | PRN

## 2020-08-18 MED ORDER — SUCCINYLCHOLINE 20MG/ML (10ML) SYRINGE FOR MEDFUSION PUMP - OPTIME
INTRAMUSCULAR | Status: DC | PRN
  Administered 2020-08-18: 100 mg via INTRAVENOUS

## 2020-08-18 MED ORDER — ALBUMIN HUMAN 5 % IV SOLN
25.0000 g | Freq: Once | INTRAVENOUS | Status: AC
  Administered 2020-08-18: 25 g via INTRAVENOUS
  Filled 2020-08-18: qty 500

## 2020-08-18 MED ORDER — CHLORHEXIDINE GLUCONATE CLOTH 2 % EX PADS
6.0000 | MEDICATED_PAD | Freq: Every day | CUTANEOUS | Status: DC
  Administered 2020-08-19 – 2020-08-31 (×13): 6 via TOPICAL

## 2020-08-18 MED ORDER — ONDANSETRON HCL 4 MG/2ML IJ SOLN: 4.0000 mg | Freq: Once | INTRAMUSCULAR | Status: DC | PRN

## 2020-08-18 MED ORDER — ACETAMINOPHEN 325 MG PO TABS: 650.0000 mg | ORAL_TABLET | Freq: Four times a day (QID) | ORAL | Status: DC | PRN

## 2020-08-18 MED ORDER — WATER FOR IRRIGATION, STERILE IR SOLN
Status: DC | PRN
  Administered 2020-08-18: 3000 mL

## 2020-08-18 MED ORDER — ACETAMINOPHEN 650 MG RE SUPP
650.0000 mg | RECTAL | Status: DC | PRN
  Filled 2020-08-18: qty 1

## 2020-08-18 MED ORDER — CHLORHEXIDINE GLUCONATE 0.12 % MT SOLN
15.0000 mL | OROMUCOSAL | Status: AC
  Filled 2020-08-18: qty 15

## 2020-08-18 MED ORDER — LIDOCAINE HCL (CARDIAC) PF 100 MG/5ML IV SOSY
PREFILLED_SYRINGE | INTRAVENOUS | Status: DC | PRN
  Administered 2020-08-18: 20 mg via INTRAVENOUS

## 2020-08-18 MED ORDER — OXYCODONE HCL 5 MG PO TABS: 5.0000 mg | ORAL_TABLET | Freq: Once | ORAL | Status: DC | PRN

## 2020-08-18 MED ORDER — FENTANYL CITRATE (PF) 250 MCG/5ML IJ SOLN
INTRAMUSCULAR | Status: AC
  Filled 2020-08-18: qty 5

## 2020-08-18 MED ORDER — LACTATED RINGERS IV SOLN: INTRAVENOUS | Status: DC

## 2020-08-18 MED ORDER — STERILE WATER FOR IRRIGATION IR SOLN
Status: DC | PRN
  Administered 2020-08-18: 1000 mL

## 2020-08-18 MED ORDER — DEXAMETHASONE SODIUM PHOSPHATE 10 MG/ML IJ SOLN
INTRAMUSCULAR | Status: DC | PRN
  Administered 2020-08-18: 10 mg via INTRAVENOUS

## 2020-08-18 MED ORDER — IOHEXOL 300 MG/ML  SOLN
INTRAMUSCULAR | Status: DC | PRN
  Administered 2020-08-18: 10 mL via URETHRAL

## 2020-08-18 MED ORDER — ONDANSETRON HCL 4 MG/2ML IJ SOLN
INTRAMUSCULAR | Status: DC | PRN
  Administered 2020-08-18: 4 mg via INTRAVENOUS

## 2020-08-18 MED ORDER — OXYCODONE HCL 5 MG/5ML PO SOLN: 5.0000 mg | Freq: Once | ORAL | Status: DC | PRN

## 2020-08-18 MED ORDER — SODIUM CHLORIDE 0.9 % IV SOLN
500.0000 mg | Freq: Two times a day (BID) | INTRAVENOUS | Status: DC
  Administered 2020-08-18 – 2020-08-25 (×15): 500 mg via INTRAVENOUS
  Filled 2020-08-18 (×18): qty 0.5

## 2020-08-18 MED ORDER — PROPOFOL 10 MG/ML IV BOLUS
INTRAVENOUS | Status: DC | PRN
  Administered 2020-08-18: 115 mg via INTRAVENOUS

## 2020-08-18 MED ORDER — MAGNESIUM SULFATE 4 GM/100ML IV SOLN
4.0000 g | Freq: Once | INTRAVENOUS | Status: AC
  Administered 2020-08-18: 4 g via INTRAVENOUS
  Filled 2020-08-18: qty 100

## 2020-08-18 MED ORDER — LACTATED RINGERS IV BOLUS
1000.0000 mL | Freq: Once | INTRAVENOUS | Status: AC
  Administered 2020-08-18: 1000 mL via INTRAVENOUS

## 2020-08-18 SURGICAL SUPPLY — 27 items
BAG URINE DRAIN 2000ML AR STRL (UROLOGICAL SUPPLIES) ×3 IMPLANT
BAG URO CATCHER STRL LF (MISCELLANEOUS) ×3 IMPLANT
CATH COUDE FOLEY 2W 5CC 18FR (CATHETERS) ×3 IMPLANT
CATH FOLEY 2WAY SLVR  5CC 16FR (CATHETERS)
CATH FOLEY 2WAY SLVR 5CC 16FR (CATHETERS) IMPLANT
CATH INTERMIT  6FR 70CM (CATHETERS) ×3 IMPLANT
CNTNR URN SCR LID CUP LEK RST (MISCELLANEOUS) ×1 IMPLANT
CONT SPEC 4OZ STRL OR WHT (MISCELLANEOUS) ×2
GLOVE BIOGEL M 8.0 STRL (GLOVE) ×3 IMPLANT
GOWN STRL REUS W/ TWL LRG LVL3 (GOWN DISPOSABLE) ×1 IMPLANT
GOWN STRL REUS W/ TWL XL LVL3 (GOWN DISPOSABLE) ×1 IMPLANT
GOWN STRL REUS W/TWL LRG LVL3 (GOWN DISPOSABLE) ×2
GOWN STRL REUS W/TWL XL LVL3 (GOWN DISPOSABLE) ×2
GUIDEWIRE ANG ZIPWIRE 038X150 (WIRE) ×3 IMPLANT
GUIDEWIRE STR DUAL SENSOR (WIRE) ×3 IMPLANT
KIT TURNOVER KIT B (KITS) ×3 IMPLANT
MANIFOLD NEPTUNE II (INSTRUMENTS) ×3 IMPLANT
NS IRRIG 1000ML POUR BTL (IV SOLUTION) IMPLANT
PACK CYSTO (CUSTOM PROCEDURE TRAY) ×3 IMPLANT
STENT URET 6FRX24 CONTOUR (STENTS) IMPLANT
STENT URET 6FRX26 CONTOUR (STENTS) ×3 IMPLANT
SYPHON OMNI JUG (MISCELLANEOUS) ×3 IMPLANT
SYR 10ML LL (SYRINGE) ×12 IMPLANT
TOWEL GREEN STERILE FF (TOWEL DISPOSABLE) ×3 IMPLANT
TUBE CONNECTING 12'X1/4 (SUCTIONS) ×1
TUBE CONNECTING 12X1/4 (SUCTIONS) ×2 IMPLANT
WATER STERILE IRR 3000ML UROMA (IV SOLUTION) ×3 IMPLANT

## 2020-08-18 NOTE — Progress Notes (Signed)
Pharmacy Antibiotic Note  Whitney [name] is a 85 y.o. female admitted on 08/17/2020 with urosepsis.  Blood cultures growing ESBL producing Klebsiella pneumoniae  Pharmacy has been consulted for meropenem Plan: Meropenem 500 mg IV q12h  Height: 5\' 3"  (160 cm) Weight: 54.4 kg (120 lb) IBW/kg (Calculated) : 52.4  Temp (24hrs), Avg:98.1 F (36.7 C), Min:97.8 F (36.6 C), Max:98.5 F (36.9 C)  Recent Labs  Lab 08/17/20 0851 08/17/20 1020 08/17/20 1041 08/17/20 1411 08/18/20 0135 08/18/20 0500  WBC 21.7*  --   --   --  32.2* DUPLICATE  CREATININE  --  2.16*  --  2.03*  --   --   LATICACIDVEN 2.8*  --  4.0*  --   --   --     Estimated Creatinine Clearance: 17.4 mL/min (A) (by C-G formula based on SCr of 2.03 mg/dL (H)).    No Known Allergies  Antimicrobials this admission: Vanc 10/7>>10/8 Cefepime 10/7>>10/8 Meropenem 10/7>>  Phillis Knack, PharmD, BCPS  08/18/2020 2:40 AM

## 2020-08-18 NOTE — Consult Note (Signed)
[facility] Nurse Consult Note:  Patient receiving care in [facility] [phone] Remote consult  Reason for Consult: Right leg wounds Wound type: Right lateral full thickness wounds Right Lateral Lower wound measures 6.5 cm x 5 cm per nursing flow sheet. Pink with no drainage.  Right lateral upper wound measures 2 cm x 1 cm per nursing flow sheet. Pink with sanguinous drainage on the foam dressing.  Dressing procedure/placement/frequency: Apply Aquacel Kellie Simmering # [phone]) to right upper lateral wound and cover with foam dressing. Change daily Apply foam dressing only to right lower lateral wound. Change 3 times a week.   Monitor the wound area(s) for worsening of condition such as: Signs/symptoms of infection, increase in size, development of or worsening of odor, development of pain, or increased pain at the affected locations.   Notify the medical team if any of these develop.  Thank you for the consult. [facility] nurse will not follow at this time.   Please re-consult the [facility] team if needed.  Cathlean Marseilles Tamala Julian, MSN, RN, [place], [place]le Pearl, [facility] Wound Treatment Associate Pager [phone]

## 2020-08-18 NOTE — Progress Notes (Signed)
RT note. RT called to bring ventilator for pt. Arriving to PACU. RT unable to do vent check due to pt. Being placed on ventilator, checked volumes and extubated by MD. Pt. On simple mask, VS stables. RT will continue to monitor.

## 2020-08-18 NOTE — Progress Notes (Signed)
Patient arrived from [facility] to unit. Patient was oriented to unit and plan of care using the language line (patient speaks Arabic). Skin check complete, bed locked in lowest position and call bell within reach. Primary concern at this time is pt's BP, maintaining a MAP of >65. Will continue to monitor and treat as needed.

## 2020-08-18 NOTE — Progress Notes (Signed)
2327- pt is screaming out; this nurse is unable to figure out patients request. This nurse called Daughter [name] to help translate. Daughter told this nurse her mother is in pain and would like pain medication. Notified IMTS awaiting orders.

## 2020-08-18 NOTE — Progress Notes (Signed)
Text paged family medicine resident to call PACU to speak to anesthesiologist about pt.

## 2020-08-18 NOTE — Progress Notes (Signed)
Initial Nutrition Assessment  DOCUMENTATION CODES:   Not applicable  INTERVENTION: RD to order nutritional supplements once diet advances as appropriate.   NUTRITION DIAGNOSIS:   Increased nutrient needs related to wound healing as evidenced by estimated needs.  GOAL:   Patient will meet greater than or equal to 90% of their needs  MONITOR:   PO intake, Supplement acceptance, Skin, Weight trends, Labs, I & O's  REASON FOR ASSESSMENT:   Consult Assessment of nutrition requirement/status, Wound healing  ASSESSMENT:   85 year old female with past medical history of type 2 diabetes, CKD stage IIIb and chronic right hip fracture who presented to the ED with multiple episodes of nausea and vomiting over the last couple weeks. Pt with septic shock secondary to emphysematous pyelitis/cystitis and gram-negative bacteremia  Pt [place] speaking. Daughter at bedside during time of visit. Pt resting in bed. Daughter reports she provides patient with at least 2-3 meals a day with Prostat/Prosource plus 2-3 times daily as pt has been refusing Ensure/Glucerna shakes at home to aid in nutrition. Pt muslim thus does not consume pork. Noted per MD, pt with n/v over the past ~3 weeks.   NUTRITION - FOCUSED PHYSICAL EXAM:    Most Recent Value  Orbital Region Unable to assess  Upper Arm Region No depletion  Thoracic and Lumbar Region No depletion  Buccal Region Unable to assess  Temple Region Unable to assess  Clavicle Bone Region No depletion  Clavicle and Acromion Bone Region No depletion  Scapular Bone Region Unable to assess  Dorsal Hand Unable to assess  Patellar Region No depletion  Anterior Thigh Region No depletion  Posterior Calf Region No depletion  Edema (RD Assessment) Moderate  Hair Reviewed  Eyes Reviewed  Mouth Reviewed  Skin Reviewed  Nails Reviewed     Labs and medications reviewed.   Diet Order:   Diet Order            Diet NPO time specified  Diet effective  now                 EDUCATION NEEDS:   Not appropriate for education at this time  Skin:  Skin Assessment: Skin Integrity Issues: Skin Integrity Issues:: Stage II Stage II: R leg  Last BM:  10/7  Height:   Ht Readings from Last 1 Encounters:  08/17/20 5\' 3"  (1.6 m)    Weight:   Wt Readings from Last 1 Encounters:  08/17/20 54.4 kg    BMI:  Body mass index is 21.26 kg/m.  Estimated Nutritional Needs:   Kcal:  1600-1800  Protein:  80-90 grams  Fluid:  >/= 1.6 L/day  Corrin Parker, MS, RD, LDN RD pager number/after hours weekend pager number on Amion.

## 2020-08-18 NOTE — Progress Notes (Addendum)
Subjective:   Whitney [name] is 85 yo female with a medical history of insulin-dependent diabetes, CKD stage III, and chronic right hip fracture that presented to [facility] with 3 week history of nausea and vomiting complicated by the development of abdominal pain, dysuria found to have septic shock secondary to cystitis and pyelitis.  Overnight, patient had persistent hypotension despite 3.5L boluses of LR with MAP hovering around 65. PCCM was consulted who advised night team to administer 500c of lactated albumin with plan to call back if unimproved. Her MAP improved to high 60s, low 70s.  This morning, she reports that she has continued abdominal pain and nausea. Her daughter interpreted for patient via telephone.  Objective:  Vital signs in last 24 hours: Vitals:   08/18/20 0232 08/18/20 0308 08/18/20 0329 08/18/20 0700  BP: (!) 86/39 (!) 118/49 (!) 116/47 (!) 107/52  Pulse: 94 96 94 96  Resp: 17 18 18 20   Temp:   (!) 97.3 F (36.3 C) 97.6 F (36.4 C)  TempSrc:   Axillary Axillary  SpO2:    97%  Weight:      Height:       SpO2: 97 % O2 Flow Rate (L/min): 2 L/min Filed Weights   08/17/20 0847  Weight: 54.4 kg    Intake/Output Summary (Last 24 hours) at 08/18/2020 0901 Last data filed at 08/18/2020 0600 Gross per 24 hour  Intake 2200 ml  Output --  Net 2200 ml   Physical Exam Constitutional:      Appearance: She is ill-appearing.  HENT:     Head: Normocephalic and atraumatic.     Mouth/Throat:     Mouth: Mucous membranes are dry.     Pharynx: Oropharynx is clear.  Eyes:     Extraocular Movements: Extraocular movements intact.     Conjunctiva/sclera: Conjunctivae normal.  Cardiovascular:     Rate and Rhythm: Normal rate. Rhythm irregular.     Comments: Weak pulses Pulmonary:     Effort: Pulmonary effort is normal. No respiratory distress.     Breath sounds: Normal breath sounds.  Abdominal:     General: Abdomen is flat. Bowel sounds are normal.     Palpations: Abdomen  is soft.     Tenderness: There is abdominal tenderness.     Comments: Tenderness to palpation throughout abdomen, but most significant in left upper and lower quadrant as well as suprapubic region.  Musculoskeletal:        General: Deformity present. Normal range of motion.     Cervical back: Normal range of motion and neck supple.     Right lower leg: Edema present.     Left lower leg: Edema present.     Comments: 2+ pitting edema in bilateral lower extremities.   Skin:    General: Skin is warm and dry.     Capillary Refill: Capillary refill takes 2 to 3 seconds.  Neurological:     Mental Status: She is alert and oriented to person, place, and time. Mental status is at baseline.  Psychiatric:        Mood and Affect: Mood normal.        Behavior: Behavior normal.        Thought Content: Thought content normal.        Judgment: Judgment normal.    CBC Latest Ref Rng & Units 08/18/2020 08/18/2020 38/05/5642  WBC K/uL DUPLICATE 29.6(H) 21.7(H)  Hemoglobin g/dL DUPLICATE 9.1(L) 11.2(L)  Hematocrit % DUPLICATE 29.2(L) 36.1  Platelets K/uL DUPLICATE  PLATELET CLUMPS NOTED ON SMEAR, COUNT APPEARS ADEQUATE 156   CMP Latest Ref Rng & Units 08/18/2020 08/17/2020 08/17/2020  Glucose 70 - 99 mg/dL 265(H) 431(H) 431(H)  BUN 8 - 23 mg/dL 48(H) 49(H) 53(H)  Creatinine 0.44 - 1.00 mg/dL 2.07(H) 2.03(H) 2.16(H)  Sodium 135 - 145 mmol/L 136 135 135  Potassium 3.5 - 5.1 mmol/L 4.3 4.6 4.8  Chloride 98 - 111 mmol/L 108 108 106  CO2 22 - 32 mmol/L 19(L) 17(L) 19(L)  Calcium 8.9 - 10.3 mg/dL 7.5(L) 7.4(L) 7.8(L)  Total Protein 6.5 - 8.1 g/dL 5.7(L) 6.1(L) 6.6  Total Bilirubin 0.3 - 1.2 mg/dL 1.6(H) 1.5(H) 1.8(H)  Alkaline Phos 38 - 126 U/L 146(H) 160(H) 178(H)  AST 15 - 41 U/L 27 27 32  ALT 0 - 44 U/L 13 12 16   Albumin                                                          1.2                               1.2  Blood cultures (collected 10/7): Enterobacterales, Klebsiella pneumoniae and CTX-M ESBL  Detected Urine culture (collected 10/7): Sent Lactic Acid: 2.3, 3.2, 4.0, 2.8 Respiratory Panel: Negative GIP Panel: needs to be collected C. Diff PCR: needs to be collected HbA1c: 7.8 Mg: 1.4 Phos: 3.1 Troponin: 24, 14   IMAGING: CT ABDOMEN PELVIS WO CONTRAST  Result Date: 08/17/2020 CLINICAL DATA:  Acute onset abdominal pain.  Leukocytosis and fever. EXAM: CT ABDOMEN AND PELVIS WITHOUT CONTRAST TECHNIQUE: Multidetector CT imaging of the abdomen and pelvis was performed following the standard protocol without IV contrast. COMPARISON:  None. FINDINGS: Lower chest: Small bilateral pleural effusions. No pericardial effusion. There is some dependent atelectasis. Mild bronchiectasis in the lower lobes bilaterally. Hepatobiliary: 2 stones are seen in the gallbladder measuring up to 0.8 cm. No gallbladder wall thickening or pericholecystic fluid. Biliary tree and liver appear normal. Pancreas: Marked fatty atrophy of the pancreas.  No focal lesion. Spleen: Normal in size without focal abnormality. Adrenals/Urinary Tract: There is gas in the left intrarenal collecting system and ureter. Gas is also seen within the lumen and wall of the urinary bladder. Left renal cyst is unchanged. The right kidney and ureter are unremarkable. The adrenal glands appear normal. Stomach/Bowel: Stomach is within normal limits. Appendix appears normal. No evidence of bowel wall thickening, distention, or inflammatory changes. Vascular/Lymphatic: No significant vascular findings are present. No enlarged abdominal or pelvic lymph nodes. Reproductive: Endometrial stripe appears thickened at 0.9 cm. No adnexal mass. Other: There is body wall edema. Small volume of ascites is present in the abdomen. No focal fluid collection. Musculoskeletal: No acute bony abnormality. Remote lumbar compression fractures are unchanged. The patient is status post left hip replacement. The right femoral head and neck has been resected or have as on the  prior exam. 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 Small bilateral pleural effusions, body wall edema and small volume of ascites compatible with volume overload. Gallstones without evidence of cholecystitis. Electronically Signed   By: Inge Rise M.D.   On: 08/17/2020 13:42   DG Chest 1 View  Result Date: 08/17/2020 CLINICAL DATA:  Pain EXAM: CHEST  1  VIEW COMPARISON:  March 09, 2020 FINDINGS: There is scarring with minimal pleural effusion in the left base, stable. Interstitial thickening bilaterally is stable. There is no frank edema or consolidation. There is cardiomegaly with pulmonary vascularity within normal limits. No adenopathy. There is aortic atherosclerosis. Bones are diffusely osteoporotic. Chronic appearing fracture proximal left humerus again noted. IMPRESSION: Scarring with minimal pleural effusion left base. Interstitial thickening may indicate underlying chronic bronchitis. No frank edema or consolidation. Stable cardiomegaly. Bones osteoporotic.  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 Aortic Atherosclerosis (ICD10-I70.0). Electronically Signed   By: Lowella Grip III M.D.   On: 08/17/2020 09:56   DG Humerus Left  Result Date: 08/17/2020 CLINICAL DATA:  Pain EXAM: LEFT HUMERUS - 2+ VIEW COMPARISON:  None. FINDINGS: Frontal and lateral views obtained. There is a suspected chronic comminuted fracture at the left proximal humeral metaphysis with marked medial displacement and lateral angulation of the distal major fracture fragment with respect proximal major fragment. No dislocation. Bones osteoporotic. There is extensive arthropathy in the left shoulder joint. Visualized left lung clear. : Suspect chronic fracture at the level of the proximal humeral metaphysis on the left with displacement and angulation at the fracture site. Bones are diffusely osteoporotic. No dislocation. Extensive arthropathy left shoulder region. Electronically  Signed   By: Lowella Grip III M.D.   On: 08/17/2020 09:55   DG Hip Unilat W or Wo Pelvis 2-3 Views Left  Result Date: 08/17/2020 CLINICAL DATA:  Pain EXAM: DG HIP (WITH OR WITHOUT PELVIS) 2-3V LEFT COMPARISON:  Right hip images March 05, 2020; pelvis image March 02, 2020 FINDINGS: Frontal pelvis and lateral left hip images were obtained. Bones are diffusely osteoporotic. There is a chronic fracture of the right subcapital femoral neck with superior and lateral displacement of the right femoral shaft with respect to the right femoral head. There is a total hip replacement on the left with prosthetic components appearing well-seated. Cement noted medial to the left hip joint. No left-sided hip region fracture is demonstrated. No dislocation on the left. Exostosis arises from the lateral ischium measuring 2.1 x 1.3 cm. IMPRESSION: 1.  Severe osteoporosis. 2. Chronic fracture right subcapital femoral neck region with chronic displacement of the right femoral shaft laterally and superiorly with respect to the right femoral head. 3. Total hip replacement on the left with prosthetic components appearing well-seated. No acute fracture or dislocation on the left side. 4. Exostosis arising from the lateral left ischium measuring 2.1 x 1.3 cm. Electronically Signed   By: Lowella Grip III M.D.   On: 08/17/2020 09:53   Assessment/Plan:  Active Problems:   Sepsis ([place])   Pyelonephritis   Pressure injury of skin  #Urosepsis, active #ESBL producing Klebsiella Pneumoniae bacteremia, active Patient presented with three week history of nausea, vomiting with acute worsening of symptoms over past two days with the development of abdominal pain, dysuria, and urinary frequency. CT revealed emphysematous cystitis and left-sided pyelitis. Blood cultures revealed Enterobacterales, Klebsiella pneumoniae and CTX-M ESBL. She initially received vancomycin, cefepime and flagyl, and was subsequently transitioned to  meropenem. Her blood pressure has improved significantly following 4.L boluses of LR and 500cc albumin. Her albumin level is 1.2 and would benefit from dietician support for protein supplementation recommendations. -Continue meropenem -LR -Zofran PRN -Lactic acid now, then at 11AM -Daily CBC with Diff -Daily CMP -Follow-up urine culture -Vitals Q2H -Consult PCCM if unable to maintain MAP >65 -Consulted RD  #Hypomagnesemia, active Magnesium 1.4 on morning labs. Replete and recheck. -  Magnesium sulfate 4g -Recheck Magnesium tomorrow morning  #CKD3, chronic Patient with history of CKD stage 3. Creatinine 2.07 today. Likely pre-renal in setting of sepsis. Chronic normocytic anemia likely secondary to anemia of CKD. -Daily CMP -Daily CBC -IVF  #T2DM, chronic Hemoglobin A1c today of 7.8. Severely hyperglycemic on initial arrival in the setting of nonadherence to prescribed insulin regimen for past two days. Patient placed on SSI with half dose of long-acting insulin with improvement of hyperglycemia. -Novolog 0-9 units TID with meals -Lantus 3 units QHS -CBGs Q4H  #Diet: NPO, requesting to bring food from home instead, SLP eval placed #IVF: LR 142mL/hr #Code status: FULL #VTE ppx: Heparin #Bowel regimen: none  Cato Mulligan, MD 08/18/2020, 9:01 AM Pager: [phone] After 5pm on weekdays and 1pm on weekends: On Call pager [phone]

## 2020-08-18 NOTE — Anesthesia Preprocedure Evaluation (Addendum)
Anesthesia Evaluation  Patient identified by MRN, date of birth, ID band Patient awake    Reviewed: Allergy & Precautions, Patient's Chart, lab work & pertinent test results  History of Anesthesia Complications Negative for: history of anesthetic complications  Airway Mallampati: II  TM Distance: >3 FB     Dental  (+) Poor Dentition   Pulmonary neg pulmonary ROS,     + decreased breath sounds      Cardiovascular negative cardio ROS   Rhythm:Regular Rate:Normal     Neuro/Psych negative neurological ROS  negative psych ROS   GI/Hepatic negative GI ROS, Neg liver ROS,   Endo/Other  diabetes, Insulin Dependent Hypocalcemia   Renal/GU CRFRenal disease    Cystitis     Musculoskeletal negative musculoskeletal ROS (+)   Abdominal   Peds  Hematology  (+) anemia ,   Anesthesia Other Findings Covid test negative Bedbound, malnourished   Reproductive/Obstetrics                            Anesthesia Physical Anesthesia Plan  ASA: III and emergent  Anesthesia Plan: General   Post-op Pain Management:    Induction: Intravenous  PONV Risk Score and Plan: 3 and Ondansetron  Airway Management Planned: Oral ETT  Additional Equipment: None  Intra-op Plan:   Post-operative Plan: Extubation in OR  Informed Consent: I have reviewed the patients History and Physical, chart, labs and discussed the procedure including the risks, benefits and alternatives for the proposed anesthesia with the patient or authorized representative who has indicated his/her understanding and acceptance.       Plan Discussed with: CRNA and Anesthesiologist  Anesthesia Plan Comments:       Anesthesia Quick Evaluation

## 2020-08-18 NOTE — Progress Notes (Signed)
PHARMACY - PHYSICIAN COMMUNICATION CRITICAL VALUE ALERT - BLOOD CULTURE IDENTIFICATION (BCID)  Whitney [name] is an 85 y.o. female who presented to [facility] on 08/17/2020 with a chief complaint of N/V and abdominal pain, pyelitis on CT    Assessment:  2/2 blood cultures growing ESBL-producing Klebsiella pneumoniae  Name of physician (or Provider) Contacted:  Dr. Jerl Mina and [name]  Current antibiotics: Vancomycin and Cefepime   Changes to prescribed antibiotics recommended:  Change antibiotics to Meropenem  Results for orders placed or performed during the hospital encounter of 08/17/20  Blood Culture ID Panel (Reflexed) (Collected: 08/17/2020  8:51 AM)  Result Value Ref Range   Enterococcus faecalis NOT DETECTED NOT DETECTED   Enterococcus Faecium NOT DETECTED NOT DETECTED   Listeria monocytogenes NOT DETECTED NOT DETECTED   Staphylococcus species NOT DETECTED NOT DETECTED   Staphylococcus aureus (BCID) NOT DETECTED NOT DETECTED   Staphylococcus epidermidis NOT DETECTED NOT DETECTED   Staphylococcus lugdunensis NOT DETECTED NOT DETECTED   Streptococcus species NOT DETECTED NOT DETECTED   Streptococcus agalactiae NOT DETECTED NOT DETECTED   Streptococcus pneumoniae NOT DETECTED NOT DETECTED   Streptococcus pyogenes NOT DETECTED NOT DETECTED   A.calcoaceticus-baumannii NOT DETECTED NOT DETECTED   Bacteroides fragilis NOT DETECTED NOT DETECTED   Enterobacterales DETECTED (A) NOT DETECTED   Enterobacter cloacae complex NOT DETECTED NOT DETECTED   Escherichia coli NOT DETECTED NOT DETECTED   Klebsiella aerogenes NOT DETECTED NOT DETECTED   Klebsiella oxytoca NOT DETECTED NOT DETECTED   Klebsiella pneumoniae DETECTED (A) NOT DETECTED   Proteus species NOT DETECTED NOT DETECTED   Salmonella species NOT DETECTED NOT DETECTED   Serratia marcescens NOT DETECTED NOT DETECTED   Haemophilus influenzae NOT DETECTED NOT DETECTED   Neisseria meningitidis NOT DETECTED NOT DETECTED    Pseudomonas aeruginosa NOT DETECTED NOT DETECTED   Stenotrophomonas maltophilia NOT DETECTED NOT DETECTED   Candida albicans NOT DETECTED NOT DETECTED   Candida auris NOT DETECTED NOT DETECTED   Candida glabrata NOT DETECTED NOT DETECTED   Candida krusei NOT DETECTED NOT DETECTED   Candida parapsilosis NOT DETECTED NOT DETECTED   Candida tropicalis NOT DETECTED NOT DETECTED   Cryptococcus neoformans/gattii NOT DETECTED NOT DETECTED   CTX-M ESBL DETECTED (A) NOT DETECTED   Carbapenem resistance IMP NOT DETECTED NOT DETECTED   Carbapenem resistance KPC NOT DETECTED NOT DETECTED   Carbapenem resistance NDM NOT DETECTED NOT DETECTED   Carbapenem resist OXA 48 LIKE NOT DETECTED NOT DETECTED   Carbapenem resistance VIM NOT DETECTED NOT DETECTED    Caryl Pina 08/18/2020  2:23 AM

## 2020-08-18 NOTE — Progress Notes (Signed)
Date: 08/18/2020  Patient name: Whitney [name]  Medical record number: [id number]  Date of birth: 29-Oct-1935   I have seen and evaluated Whitney [name] and discussed their care with the Residency Team.  In brief, patient is an 85 year old female with past medical history of type 2 diabetes, CKD stage IIIb and chronic right hip fracture who presented to the ED with multiple episodes of nausea and vomiting over the last couple weeks.  History obtained from chart as well as patient through translator.  Approximately 2 to 3 weeks ago patient developed sudden onset of nausea with multiple episodes of vomiting which is nonbloody nonbilious as well as associated diarrhea.  These episodes lasted for 2 to 3 days at a time and then resolved but recurred intermittently over the last couple weeks.  Patient was seen by her PCP on home visit who was concerned for possible C. difficile and sent a stool sample out for this.  On the day prior to admission patient had recurrent symptoms that lasted throughout the night and daughter called EMS for admission.  Patient has had decreased oral intake and her last meal was the afternoon prior to her admission.  Patient is also not been taking her insulin at home the last couple of days.  Patient does have some associated abdominal pain and burning urination over the last couple of days.  No chest pain, no palpitations, no lightheadedness, no syncope, no focal weakness, no tingling or numbness, no headache, no blurry vision, no fevers or chills.  In the ED, patient was noted to have lactic acidosis as well as leukocytosis with white count of 21 and AKI on CKD.  Patient was initially started on broad-spectrum antibiotics and admitted to [facility] for further evaluation.  Overnight, patient did have persistent episodes of hypotension requiring multiple fluid boluses and albumin administration.  Today, patient reports continued abdominal pain and nausea.  She remains n.p.o. at this  time.  PMHx, Fam Hx, and/or Soc Hx : As per resident admit note  Vitals:   08/18/20 0700 08/18/20 1135  BP: (!) 107/52 (!) 103/41  Pulse: 96 60  Resp: 20 15  Temp: 97.6 F (36.4 C) 97.8 F (36.6 C)  SpO2: 97% 98%   General: Awake, alert, oriented x3, ill-appearing CVS: Regular rate and rhythm, normal heart sounds Lungs: CTA bilaterally Abdomen: Soft, mild diffuse tenderness worse in her left upper and lower quadrant and suprapubic regions, nondistended, normoactive bowel sounds Extremities: 2+ bilateral lower extremity pitting edema noted, nontender to palpation HEENT: Normocephalic, atraumatic Psych: Normal mood and affect Skin: Warm and dry  Assessment and Plan: I have seen and evaluated the patient as outlined above. I agree with the formulated Assessment and Plan as detailed in the residents' note, with the following changes:   1.  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 -Patient blood pressures are mildly improved today with systolics in the 616W and tachycardia has resolved.  Patient has remained afebrile overnight. -We will continue with meropenem to cover for ESBL Klebsiella and follow-up sensitivities of blood cultures and urine culture -Lactic acidosis has improved and is down to 2.1 today.  We will continue to monitor -Patient with persistent leukocytosis (now up to 29.6).  We will continue to monitor CBC daily -  Continue with IV fluids for now and monitor BP closely -Patient's blood sugars were elevated to the 400s on admission in the setting of not taking her insulin and underlying sepsis.  Blood sugars have improved to the 100s to 200s today.  We will continue to  monitor closely -Would discuss management with urology (if patient had emphysematous pyelonephritis she may have required a nephrectomy but she does have emphysematous pyelitis and will discuss with urology to see if she needs any urological intervention) -Patient with AKI on CKD.  I suspect is likely prerenal in the setting of decreased oral intake.  There is also possibility the patient has ATN secondary to hypotension from her underlying septic shock.  Patient's creatinine has improved to 2.07 from 2.16 on admission (baseline creatinine appears to be 1.3-1.5).  We will continue with IV fluids for now -No further work-up at this time.  We will continue to monitor closely  Aldine Contes, MD 10/8/20212:54 PM

## 2020-08-18 NOTE — Progress Notes (Signed)
Full consult to follow.  I was contacted by Dr. Wynetta Emery around [place] today regarding this patient.  Briefly, this is an 85 year old female admitted yesterday with 3 to 4 weeks of nausea, vomiting and generalized abdominal pain.  CT A/P 08/17/2020 revealed emphysematous cystitis and left pyelitis.  Air was present within the lumen and wall of the bladder.  Air is seen within the left collecting system.  No air is seen within the right collecting system.  She has been posted for cystoscopy, left retrograde pyelogram, left ureteral stent placement and Foley catheter placement.  This will proceed as an urgent case within the hour.  Continue broad-spectrum antibiotics.  Hoping to avoid cystectomy and left nephrectomy in this elderly patient with comorbidities.  Obtain urine culture.  Reevaluate with CT 48 hours after decompression.  [facility]  Pager: [phone]

## 2020-08-18 NOTE — Progress Notes (Signed)
Patient arrived to unit via PACU. Pt vitals WNL with RRs at 24, making patient a yellow mews. Pt also came up with a foley.  Pt is alert but due to language barrier and hard of hearing was unable to assess orientation.     Sharin Mons, RN

## 2020-08-18 NOTE — Anesthesia Procedure Notes (Addendum)
Procedure Name: Intubation Date/Time: 08/18/2020 7:58 PM Performed by: Valetta Fuller, CRNA Pre-anesthesia Checklist: Patient identified, Emergency Drugs available, Suction available and Patient being monitored Patient Re-evaluated:Patient Re-evaluated prior to induction Oxygen Delivery Method: Circle system utilized Preoxygenation: Pre-oxygenation with 100% oxygen Induction Type: IV induction and Rapid sequence Laryngoscope Size: Miller and 2 Grade View: Grade II Tube type: Oral Tube size: 7.5 mm Number of attempts: 1 Airway Equipment and Method: Stylet Placement Confirmation: ETT inserted through vocal cords under direct vision,  positive ETCO2 and breath sounds checked- equal and bilateral Secured at: 23 cm Tube secured with: Tape Dental Injury: Teeth and Oropharynx as per pre-operative assessment

## 2020-08-18 NOTE — Progress Notes (Signed)
CRITICAL VALUE ALERT  Critical Value:  Lactic acid 2.1  Date & Time Notied:  08/18/20 1230  Provider Notified: Dr Court Joy    Orders Received/Actions taken: see new orders

## 2020-08-18 NOTE — Progress Notes (Signed)
Report called to Zandra Abts on 4 NP. Patient to transfer to 4 NP bed 3.

## 2020-08-18 NOTE — Progress Notes (Signed)
Patient arrived to 6 N bed 12 from emergency department.Patient assisted to bed by nursing staff. Patient denies pain at current time.Oriented patient to nursing unit; call bell and phone within reach and bed alarm set for safety.

## 2020-08-18 NOTE — Transfer of Care (Signed)
Immediate Anesthesia Transfer of Care Note  Patient: Whitney [name]  Procedure(s) Performed: CYSTOSCOPY WITH RETROGRADE PYELOGRAM/URETERAL STENT PLACEMENT (Left )  Patient Location: PACU  Anesthesia Type:General  Level of Consciousness: Patient remains intubated per anesthesia plan  Airway & Oxygen Therapy: Patient placed on Ventilator (see vital sign flow sheet for setting)  Post-op Assessment: Report given to RN and Post -op Vital signs reviewed and stable  Post vital signs: Reviewed and stable  Last Vitals:  Vitals Value Taken Time  BP 150/72 08/18/20 2124  Temp    Pulse 111 08/18/20 2124  Resp 32 08/18/20 2124  SpO2 100 % 08/18/20 2124  Vitals shown include unvalidated device data.  Last Pain:  Vitals:   08/18/20 1625  TempSrc: Axillary  PainSc:          Complications: No complications documented.

## 2020-08-18 NOTE — Progress Notes (Signed)
Patient has written and signed DNR paperwork in paper chart. Oncall night MD notified of conflict with Full Code in [place] chart.

## 2020-08-18 NOTE — Op Note (Signed)
Operative Note  Preoperative diagnosis:  1.  Emphysematous cystitis 2. Emphysematous pyelitis   Postoperative diagnosis: 1.  Emphysematous cystitis 2. Emphysematous pyelitis   Procedure(s): 1.  Cystoscopy 2. Left retrograde pyelogram with interpretation 3. Left ureteral stent placement 4. Foley catheter placement  Surgeon: Rexene Alberts, MD  Assistants:  None  Anesthesia:  General  Complications:  None  EBL:  minimal  Specimens: 1.  ID Type Source Tests Collected by Time Destination  A : LEFT RENAL PELVIS Urinary Urine, Cystoscope ANAEROBIC CULTURE, CULTURE, FUNGUS WITHOUT SMEAR, AEROBIC/ANAEROBIC CULTURE (SURGICAL/DEEP WOUND), ACID FAST CULTURE WITH REFLEXED SENSITIVITIES (MYCOBACTERIA), ACID FAST SMEAR (AFB, MYCOBACTERIA) [name], MD 08/18/2020 2035     Drains/Catheters: 1.  6Fr x 26 cm left ureteral stent 2. 18Fr foley  Intraoperative findings:   1. Bladder urothelium throughout consistent with cystitis with erythema along nearly the entire mucosa.  Debris present within the bladder and some emanating from the left ureteral orifice which is a creamy thin white sludge.  Some difficulty initially finding the ureteral orifices.  Left hydronephrotic drip with dark-colored urine.  No hydronephrosis on the left.  No filling defects or extravasation of contrast.  Successful stent placement with a curl seen in the left upper pole and bladder respectively.  Indication:  Whitney [name] is a 85 y.o. female with emphysematous cystitis and left pyelitis.  She presented septic and had a CT A/P on 08/17/2020 with the findings above.  She was tachycardic with systolic blood pressures in the 90s with a significant leukocytosis and lactic acidosis.  She was started on broad-spectrum antibiotics.  Urology was consulted late in the afternoon on 08/18/2020 for further management.  She was urgently taken to the operating room with next available case for left ureteral stent placement and Foley  catheter drainage. After reviewing the management options for treatment, her daughter elected to proceed with the above surgical procedure(s). We have discussed the potential benefits and risks of the procedure, side effects of the proposed treatment, the likelihood of the patient achieving the goals of the procedure, and any potential problems that might occur during the procedure or recuperation. Informed consent has been obtained.  Description of procedure: The patient was taken to the operating room and general anesthesia was induced.  The patient was placed in the dorsal lithotomy position, prepped and draped in the usual sterile fashion, and preoperative antibiotics were administered. A preoperative time-out was performed.   Cystourethroscopy was performed.  The patient's urethra was examined and was normal. The bladder was then systematically examined in its entirety.  The entire mucosa had signs of cystitis with erythema and debris.  The mucosa was sloughing off in some areas.  There is a thin white creamy discharge emanating from the left ureteral orifice.  Attention then turned to the left ureteral orifice and a ureteral catheter with straight zip wire was used to intubate the ureteral orifice.  This was passed up to the level of the left renal pelvis.  The wire was removed.  There was a hydronephrotic drip.  This was sent off for left renal pelvis urine for culture.  Omnipaque contrast was injected through the ureteral catheter and a retrograde pyelogram was performed with findings as dictated above.  Wire was replaced.  5 [place] open-ended was removed.  A 6 [place] by 26 cm left ureteral stent was advanced at the left collecting system under fluoroscopic guidance.  There was a curl within the left upper pole and bladder respectively.  An 1  [place] Foley catheter was placed and drained the bladder. The patient appeared to tolerate the procedure well and without complications.  The patient was able  to be awakened and transferred to the recovery unit in satisfactory condition.   Plan: Leave left ureteral stent and Foley catheter for maximal drainage.  If she does not improve, she may need left percutaneous nephrostomy tube placement.  We will reimage in 48 hours or sooner if develops fevers despite broad-spectrum antibiotics to ensure resolution of emphysematous cystitis/pyelitis.  Matt R[facility]  Pager: [phone]

## 2020-08-18 NOTE — Progress Notes (Signed)
Indwelling catheter placed by this nurse, 2nd nurse and NT. Pt unable to open legs. Daughter at bedside explaining what was going on to patient.

## 2020-08-18 NOTE — Consult Note (Addendum)
Urology Consult   Physician requesting consult: Aldine Contes, MD  Reason for consult: emphysematous cystitis, L pyelitis   History of Present Illness: Whitney [name] is a 85 y.o. with a past medical history of diabetes, CKD stage III, and who is bedbound presented with 3-week episode of nausea and emesis as well as diarrhea.   CT abdomen pelvis was obtained on 08/17/2020 that demonstrated emphysematous cystitis and emphysematous left pyelitis.  Gas was seen within the lumen and wall of the urinary bladder as well as gas within the left intrarenal collecting system and ureter.  There were no abnormalities in the right collecting system or ureter.  Upon arrival in the ED yesterday she was found to be tachycardic in the 952W, systolic blood pressures in the 90s, WBC 21, hemoglobin 11, lactic acid 4, creatinine 2.1, UA positive for leukocytes but negative for nitrites.  She was given IV fluids and started on Flagyl, cefepime and vancomycin.  History is limited from the patient. She does have a history of urinary retention and had a catheter placed sometime.  There is note that she had a consultation arranged with [facility] in 02/2020 however she never showed for this visit.   Past Medical History:  Diagnosis Date  . Diabetes mellitus without complication [facility])     Past Surgical History:  Procedure Laterality Date  . TOTAL HIP ARTHROPLASTY Right 03/05/2020   Procedure: HIP GIRDLE STONE;  Surgeon: Leandrew Koyanagi, MD;  Location: [place];  Service: Orthopedics;  Laterality: Right;     Current Hospital Medications:  Home meds:  No current facility-administered medications on file prior to encounter.   Current Outpatient Medications on File Prior to Encounter  Medication Sig Dispense Refill  . calcitonin, salmon, (MIACALCIN/FORTICAL) 200 UNIT/ACT nasal spray Place 1 spray into alternate nostrils daily. 3.7 mL 0  . ibuprofen (ADVIL) 200 MG tablet Take 200-400 mg by mouth daily as needed  for headache (pain).    . insulin glargine (LANTUS) 100 UNIT/ML Solostar Pen Inject 5 Units into the skin at bedtime. (Patient taking differently: Inject 6 Units into the skin at bedtime. ) 3 mL 5  . Multiple Vitamin (MULTIVITAMIN WITH MINERALS) TABS tablet Take 1 tablet by mouth daily.    . Vitamin D, Ergocalciferol, (DRISDOL) 1.25 MG (50000 UNIT) CAPS capsule Take 1 capsule (50,000 Units total) by mouth every 7 (seven) days. (Patient taking differently: Take 50,000 Units by mouth every 7 (seven) days. Sunday) 8 capsule 0  . acetaminophen (TYLENOL) 500 MG tablet Take 2 tablets by mouth every 8 (eight) hours as needed for moderate pain. (Patient not taking: Reported on 05/27/2020)    . famotidine (PEPCID) 20 MG tablet Take 1 tablet (20 mg total) by mouth 2 (two) times daily. (Patient not taking: Reported on 05/08/2020) 60 tablet 0  . Insulin Pen Needle (PEN NEEDLES) 30G X 5 MM MISC Use as instructed. 100 each 12  . oxyCODONE-acetaminophen (PERCOCET) 5-325 MG tablet Take 1-2 tablets by mouth every 8 (eight) hours as needed for severe pain. (Patient not taking: Reported on 04/12/2020) 30 tablet 0     Scheduled Meds: . heparin injection (subcutaneous)  5,000 Units Subcutaneous Q8H  . insulin aspart  0-9 Units Subcutaneous TID WC  . insulin glargine  3 Units Subcutaneous QHS   Continuous Infusions: . lactated ringers 200 mL/hr at 08/18/20 1356  . meropenem (MERREM) IV 500 mg (08/18/20 0514)   PRN Meds:.ondansetron **OR** ondansetron (ZOFRAN) IV  Allergies: No Known Allergies  No family  history on file.  Social History:  reports that she has never smoked. She has never used smokeless tobacco. She reports that she does not drink alcohol and does not use drugs.  ROS: A complete review of systems was performed.  All systems are negative except for pertinent findings as noted.  Physical Exam:  Vital signs in last 24 hours: Temp:  [97.3 F (36.3 C)-98.2 F (36.8 C)] 97.8 F (36.6 C) (10/08  1135) Pulse Rate:  [60-110] 60 (10/08 1135) Resp:  [15-27] 15 (10/08 1135) BP: (86-118)/(39-54) 103/41 (10/08 1135) SpO2:  [95 %-100 %] 98 % (10/08 1135) Constitutional:  Alert and oriented, No acute distress Cardiovascular: Regular rate and rhythm Respiratory: Normal respiratory effort, Lungs clear bilaterally GI: Abdomen is obese, soft, mildly tender throughout  GU: No CVA tenderness Neurologic: Grossly intact, no focal deficits Psychiatric: Normal mood and affect  Laboratory Data:  Recent Labs    08/17/20 0851 08/18/20 0135 08/18/20 0500  WBC 25.8* 52.7* DUPLICATE  HGB 78.2* 9.1* DUPLICATE  HCT 42.3 53.6* DUPLICATE  PLT 144 PLATELET CLUMPS NOTED ON SMEAR, COUNT APPEARS ADEQUATE DUPLICATE    Recent Labs    08/17/20 1020 08/17/20 1411 08/18/20 0135  NA 135 135 136  K 4.8 4.6 4.3  CL 106 108 108  GLUCOSE 431* 431* 265*  BUN 53* 49* 48*  CALCIUM 7.8* 7.4* 7.5*  CREATININE 2.16* 2.03* 2.07*     Results for orders placed or performed during the hospital encounter of 08/17/20 (from the past 24 hour(s))  CBG monitoring, ED     Status: Abnormal   Collection Time: 08/17/20  5:11 PM  Result Value Ref Range   Glucose-Capillary 330 (H) 70 - 99 mg/dL  Glucose, capillary     Status: Abnormal   Collection Time: 08/18/20 12:21 AM  Result Value Ref Range   Glucose-Capillary 249 (H) 70 - 99 mg/dL  Troponin I (High Sensitivity)     Status: Abnormal   Collection Time: 08/18/20  1:35 AM  Result Value Ref Range   Troponin I (High Sensitivity) 24 (H) <18 ng/L  Phosphorus     Status: None   Collection Time: 08/18/20  1:35 AM  Result Value Ref Range   Phosphorus 3.1 2.5 - 4.6 mg/dL  Magnesium     Status: Abnormal   Collection Time: 08/18/20  1:35 AM  Result Value Ref Range   Magnesium 1.4 (L) 1.7 - 2.4 mg/dL  Comprehensive metabolic panel     Status: Abnormal   Collection Time: 08/18/20  1:35 AM  Result Value Ref Range   Sodium 136 135 - 145 mmol/L   Potassium 4.3 3.5 - 5.1  mmol/L   Chloride 108 98 - 111 mmol/L   CO2 19 (L) 22 - 32 mmol/L   Glucose, Bld 265 (H) 70 - 99 mg/dL   BUN 48 (H) 8 - 23 mg/dL   Creatinine, Ser 2.07 (H) 0.44 - 1.00 mg/dL   Calcium 7.5 (L) 8.9 - 10.3 mg/dL   Total Protein 5.7 (L) 6.5 - 8.1 g/dL   Albumin 1.2 (L) 3.5 - 5.0 g/dL   AST 27 15 - 41 U/L   ALT 13 0 - 44 U/L   Alkaline Phosphatase 146 (H) 38 - 126 U/L   Total Bilirubin 1.6 (H) 0.3 - 1.2 mg/dL   GFR calc non Af Amer 22 (L) >60 mL/min   Anion gap 9 5 - 15  Lactic acid, plasma     Status: Abnormal   Collection Time: 08/18/20  1:35 AM  Result Value Ref Range   Lactic Acid, Venous 3.2 (HH) 0.5 - 1.9 mmol/L  CBC     Status: Abnormal   Collection Time: 08/18/20  1:35 AM  Result Value Ref Range   WBC 29.6 (H) 4.0 - 10.5 K/uL   RBC 3.12 (L) 3.87 - 5.11 MIL/uL   Hemoglobin 9.1 (L) 12.0 - 15.0 g/dL   HCT 29.2 (L) 36 - 46 %   MCV 93.6 80.0 - 100.0 fL   MCH 29.2 26.0 - 34.0 pg   MCHC 31.2 30.0 - 36.0 g/dL   RDW 15.3 11.5 - 15.5 %   Platelets  150 - 400 K/uL    PLATELET CLUMPS NOTED ON SMEAR, COUNT APPEARS ADEQUATE   nRBC 0.0 0.0 - 0.2 %  Hemoglobin A1c     Status: Abnormal   Collection Time: 08/18/20  1:35 AM  Result Value Ref Range   Hgb A1c MFr Bld 7.8 (H) 4.8 - 5.6 %   Mean Plasma Glucose 177.16 mg/dL  Differential     Status: Abnormal   Collection Time: 08/18/20  1:35 AM  Result Value Ref Range   Neutrophils Relative % 88 %   Neutro Abs 26.2 (H) 1.7 - 7.7 K/uL   Lymphocytes Relative 3 %   Lymphs Abs 0.7 0.7 - 4.0 K/uL   Monocytes Relative 4 %   Monocytes Absolute 1.1 (H) 0.1 - 1.0 K/uL   Eosinophils Relative 1 %   Eosinophils Absolute 0.2 0 - 0 K/uL   Basophils Relative 0 %   Basophils Absolute 0.1 0 - 0 K/uL   Immature Granulocytes 4 %   Abs Immature Granulocytes 1.26 (H) 0.00 - 0.07 K/uL  Glucose, capillary     Status: Abnormal   Collection Time: 08/18/20  3:33 AM  Result Value Ref Range   Glucose-Capillary 229 (H) 70 - 99 mg/dL  CBC with  Differential/Platelet     Status: None (Preliminary result)   Collection Time: 08/18/20  5:00 AM  Result Value Ref Range   WBC DUPLICATE K/uL   RBC DUPLICATE MIL/uL   Hemoglobin DUPLICATE g/dL   HCT DUPLICATE %   MCV DUPLICATE fL   MCH DUPLICATE pg   MCHC DUPLICATE g/dL   RDW DUPLICATE %   Platelets DUPLICATE K/uL   nRBC DUPLICATE %   Neutrophils Relative % PENDING %   Neutro Abs PENDING K/uL   Band Neutrophils PENDING %   Lymphocytes Relative PENDING %   Lymphs Abs PENDING K/uL   Monocytes Relative PENDING %   Monocytes Absolute PENDING K/uL   Eosinophils Relative PENDING %   Eosinophils Absolute PENDING K/uL   Basophils Relative PENDING %   Basophils Absolute PENDING K/uL   WBC Morphology PENDING    RBC Morphology PENDING    Smear Review PENDING    Other PENDING %   nRBC PENDING /100 WBC   Metamyelocytes Relative PENDING %   Myelocytes PENDING %   Promyelocytes Relative PENDING %   Blasts PENDING %   Immature Granulocytes PENDING %   Abs Immature Granulocytes PENDING K/uL  Lactic acid, plasma     Status: Abnormal   Collection Time: 08/18/20  5:35 AM  Result Value Ref Range   Lactic Acid, Venous 2.3 (HH) 0.5 - 1.9 mmol/L  Glucose, capillary     Status: Abnormal   Collection Time: 08/18/20  8:02 AM  Result Value Ref Range   Glucose-Capillary 217 (H) 70 - 99 mg/dL  Lactic acid, plasma  Status: None   Collection Time: 08/18/20  8:46 AM  Result Value Ref Range   Lactic Acid, Venous 1.6 0.5 - 1.9 mmol/L  Lactic acid, plasma     Status: Abnormal   Collection Time: 08/18/20 11:12 AM  Result Value Ref Range   Lactic Acid, Venous 2.1 (HH) 0.5 - 1.9 mmol/L  Glucose, capillary     Status: Abnormal   Collection Time: 08/18/20 11:35 AM  Result Value Ref Range   Glucose-Capillary 197 (H) 70 - 99 mg/dL   Recent Results (from the past 240 hour(s))  Blood culture (routine x 2)     Status: Abnormal (Preliminary result)   Collection Time: 08/17/20  8:51 AM   Specimen:  BLOOD  Result Value Ref Range Status   Specimen Description BLOOD LEFT ANTECUBITAL  Final   Special Requests   Final    BOTTLES DRAWN AEROBIC AND ANAEROBIC Blood Culture results may not be optimal due to an inadequate volume of blood received in culture bottles   Culture  Setup Time   Final    GRAM NEGATIVE RODS IN BOTH AEROBIC AND ANAEROBIC BOTTLES CRITICAL RESULT CALLED TO, READ BACK BY AND VERIFIED WITH: G. ABBOTT,PHARMD 0222 08/18/2020 T. TYSOR    Culture (A)  Final    KLEBSIELLA PNEUMONIAE SUSCEPTIBILITIES TO FOLLOW Performed at [facility], [address], [place], [place] [address]    Report Status PENDING  Incomplete  Blood Culture ID Panel (Reflexed)     Status: Abnormal   Collection Time: 08/17/20  8:51 AM  Result Value Ref Range Status   Enterococcus faecalis NOT DETECTED NOT DETECTED Final   Enterococcus Faecium NOT DETECTED NOT DETECTED Final   Listeria monocytogenes NOT DETECTED NOT DETECTED Final   Staphylococcus species NOT DETECTED NOT DETECTED Final   Staphylococcus aureus (BCID) NOT DETECTED NOT DETECTED Final   Staphylococcus epidermidis NOT DETECTED NOT DETECTED Final   Staphylococcus lugdunensis NOT DETECTED NOT DETECTED Final   Streptococcus species NOT DETECTED NOT DETECTED Final   Streptococcus agalactiae NOT DETECTED NOT DETECTED Final   Streptococcus pneumoniae NOT DETECTED NOT DETECTED Final   Streptococcus pyogenes NOT DETECTED NOT DETECTED Final   A.calcoaceticus-baumannii NOT DETECTED NOT DETECTED Final   Bacteroides fragilis NOT DETECTED NOT DETECTED Final   Enterobacterales DETECTED (A) NOT DETECTED Final    Comment: Enterobacterales represent a large order of gram negative bacteria, not a single organism. CRITICAL RESULT CALLED TO, READ BACK BY AND VERIFIED WITH: G. ABBOTT,PHARMD 0222 08/18/2020 T. TYSOR    Enterobacter cloacae complex NOT DETECTED NOT DETECTED Final   Escherichia coli NOT DETECTED NOT DETECTED Final   Klebsiella  aerogenes NOT DETECTED NOT DETECTED Final   Klebsiella oxytoca NOT DETECTED NOT DETECTED Final   Klebsiella pneumoniae DETECTED (A) NOT DETECTED Final    Comment: CRITICAL RESULT CALLED TO, READ BACK BY AND VERIFIED WITH: G. ABBOTT,PHARMD 0222 08/18/2020 T. TYSOR    Proteus species NOT DETECTED NOT DETECTED Final   Salmonella species NOT DETECTED NOT DETECTED Final   Serratia marcescens NOT DETECTED NOT DETECTED Final   Haemophilus influenzae NOT DETECTED NOT DETECTED Final   Neisseria meningitidis NOT DETECTED NOT DETECTED Final   Pseudomonas aeruginosa NOT DETECTED NOT DETECTED Final   Stenotrophomonas maltophilia NOT DETECTED NOT DETECTED Final   Candida albicans NOT DETECTED NOT DETECTED Final   Candida auris NOT DETECTED NOT DETECTED Final   Candida glabrata NOT DETECTED NOT DETECTED Final   Candida krusei NOT DETECTED NOT DETECTED Final   Candida  parapsilosis NOT DETECTED NOT DETECTED Final   Candida tropicalis NOT DETECTED NOT DETECTED Final   Cryptococcus neoformans/gattii NOT DETECTED NOT DETECTED Final   CTX-M ESBL DETECTED (A) NOT DETECTED Final    Comment: CRITICAL RESULT CALLED TO, READ BACK BY AND VERIFIED WITH: G. ABBOTT,PHARMD 0222 08/18/2020 T. TYSOR (NOTE) Extended spectrum beta-lactamase detected. Recommend a carbapenem as initial therapy.      Carbapenem resistance IMP NOT DETECTED NOT DETECTED Final   Carbapenem resistance KPC NOT DETECTED NOT DETECTED Final   Carbapenem resistance NDM NOT DETECTED NOT DETECTED Final   Carbapenem resist OXA 48 LIKE NOT DETECTED NOT DETECTED Final   Carbapenem resistance VIM NOT DETECTED NOT DETECTED Final    Comment: Performed at [facility], [place] [address] [place], [place] [address]  Respiratory Panel by RT PCR (Flu A&B, Covid) - Nasopharyngeal Swab     Status: None   Collection Time: 08/17/20  9:56 AM   Specimen: Nasopharyngeal Swab  Result Value Ref Range Status   SARS Coronavirus 2 by RT PCR NEGATIVE NEGATIVE  Final    Comment: (NOTE) SARS-CoV-2 target nucleic acids are NOT DETECTED.  The SARS-CoV-2 RNA is generally detectable in upper respiratoy specimens during the acute phase of infection. The lowest concentration of SARS-CoV-2 viral copies this assay can detect is 131 copies/mL. A negative result does not preclude SARS-Cov-2 infection and should not be used as the sole basis for treatment or other patient management decisions. A negative result may occur with  improper specimen collection/handling, submission of specimen other than nasopharyngeal swab, presence of viral mutation(s) within the areas targeted by this assay, and inadequate number of viral copies (<131 copies/mL). A negative result must be combined with clinical observations, patient history, and epidemiological information. The expected result is Negative.  Fact Sheet for Patients:  PinkCheek.be  Fact Sheet for Healthcare Providers:  GravelBags.it  This test is no t yet approved or cleared by the [place] FDA and  has been authorized for detection and/or diagnosis of SARS-CoV-2 by FDA under an Emergency Use Authorization (EUA). This EUA will remain  in effect (meaning this test can be used) for the duration of the COVID-19 declaration under Section 564(b)(1) of the Act, 21 U.S.C. section 360bbb-3(b)(1), unless the authorization is terminated or revoked sooner.     Influenza A by PCR NEGATIVE NEGATIVE Final   Influenza B by PCR NEGATIVE NEGATIVE Final    Comment: (NOTE) The Xpert Xpress SARS-CoV-2/FLU/RSV assay is intended as an aid in  the diagnosis of influenza from Nasopharyngeal swab specimens and  should not be used as a sole basis for treatment. Nasal washings and  aspirates are unacceptable for Xpert Xpress SARS-CoV-2/FLU/RSV  testing.  Fact Sheet for Patients: PinkCheek.be  Fact Sheet for Healthcare  Providers: GravelBags.it  This test is not yet approved or cleared by the [place] FDA and  has been authorized for detection and/or diagnosis of SARS-CoV-2 by  FDA under an Emergency Use Authorization (EUA). This EUA will remain  in effect (meaning this test can be used) for the duration of the  Covid-19 declaration under Section 564(b)(1) of the Act, 21  U.S.C. section 360bbb-3(b)(1), unless the authorization is  terminated or revoked. Performed at [facility], [place] [address], [place],  [address]     Renal Function: Recent Labs    08/17/20 1020 08/17/20 1411 08/18/20 0135  CREATININE 2.16* 2.03* 2.07*   Estimated Creatinine Clearance: 17 mL/min (A) (by C-G formula based on  SCr of 2.07 mg/dL (H)).  Radiologic Imaging: CT ABDOMEN PELVIS WO CONTRAST  Result Date: 08/17/2020 CLINICAL DATA:  Acute onset abdominal pain.  Leukocytosis and fever. EXAM: CT ABDOMEN AND PELVIS WITHOUT CONTRAST TECHNIQUE: Multidetector CT imaging of the abdomen and pelvis was performed following the standard protocol without IV contrast. COMPARISON:  None. FINDINGS: Lower chest: Small bilateral pleural effusions. No pericardial effusion. There is some dependent atelectasis. Mild bronchiectasis in the lower lobes bilaterally. Hepatobiliary: 2 stones are seen in the gallbladder measuring up to 0.8 cm. No gallbladder wall thickening or pericholecystic fluid. Biliary tree and liver appear normal. Pancreas: Marked fatty atrophy of the pancreas.  No focal lesion. Spleen: Normal in size without focal abnormality. Adrenals/Urinary Tract: There is gas in the left intrarenal collecting system and ureter. Gas is also seen within the lumen and wall of the urinary bladder. Left renal cyst is unchanged. The right kidney and ureter are unremarkable. The adrenal glands appear normal. Stomach/Bowel: Stomach is within normal limits. Appendix appears normal. No evidence of bowel wall  thickening, distention, or inflammatory changes. Vascular/Lymphatic: No significant vascular findings are present. No enlarged abdominal or pelvic lymph nodes. Reproductive: Endometrial stripe appears thickened at 0.9 cm. No adnexal mass. Other: There is body wall edema. Small volume of ascites is present in the abdomen. No focal fluid collection. Musculoskeletal: No acute bony abnormality. Remote lumbar compression fractures are unchanged. The patient is status post left hip replacement. The right femoral head and neck has been resected or have as on the prior exam. 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 Small bilateral pleural effusions, body wall edema and small volume of ascites compatible with volume overload. Gallstones without evidence of cholecystitis. Electronically Signed   By: Inge Rise M.D.   On: 08/17/2020 13:42   DG Chest 1 View  Result Date: 08/17/2020 CLINICAL DATA:  Pain EXAM: CHEST  1 VIEW COMPARISON:  March 09, 2020 FINDINGS: There is scarring with minimal pleural effusion in the left base, stable. Interstitial thickening bilaterally is stable. There is no frank edema or consolidation. There is cardiomegaly with pulmonary vascularity within normal limits. No adenopathy. There is aortic atherosclerosis. Bones are diffusely osteoporotic. Chronic appearing fracture proximal left humerus again noted. IMPRESSION: Scarring with minimal pleural effusion left base. Interstitial thickening may indicate underlying chronic bronchitis. No frank edema or consolidation. Stable cardiomegaly. Bones osteoporotic.  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 Aortic Atherosclerosis (ICD10-I70.0). Electronically Signed   By: Lowella Grip III M.D.   On: 08/17/2020 09:56   DG Humerus Left  Result Date: 08/17/2020 CLINICAL DATA:  Pain EXAM: LEFT HUMERUS - 2+ VIEW COMPARISON:  None. FINDINGS: Frontal and lateral views obtained. There is a suspected chronic  comminuted fracture at the left proximal humeral metaphysis with marked medial displacement and lateral angulation of the distal major fracture fragment with respect proximal major fragment. No dislocation. Bones osteoporotic. There is extensive arthropathy in the left shoulder joint. Visualized left lung clear. : Suspect chronic fracture at the level of the proximal humeral metaphysis on the left with displacement and angulation at the fracture site. Bones are diffusely osteoporotic. No dislocation. Extensive arthropathy left shoulder region. Electronically Signed   By: Lowella Grip III M.D.   On: 08/17/2020 09:55   DG Hip Unilat W or Wo Pelvis 2-3 Views Left  Result Date: 08/17/2020 CLINICAL DATA:  Pain EXAM: DG HIP (WITH OR WITHOUT PELVIS) 2-3V LEFT COMPARISON:  Right hip images March 05, 2020; pelvis image March 02, 2020  FINDINGS: Frontal pelvis and lateral left hip images were obtained. Bones are diffusely osteoporotic. There is a chronic fracture of the right subcapital femoral neck with superior and lateral displacement of the right femoral shaft with respect to the right femoral head. There is a total hip replacement on the left with prosthetic components appearing well-seated. Cement noted medial to the left hip joint. No left-sided hip region fracture is demonstrated. No dislocation on the left. Exostosis arises from the lateral ischium measuring 2.1 x 1.3 cm. IMPRESSION: 1.  Severe osteoporosis. 2. Chronic fracture right subcapital femoral neck region with chronic displacement of the right femoral shaft laterally and superiorly with respect to the right femoral head. 3. Total hip replacement on the left with prosthetic components appearing well-seated. No acute fracture or dislocation on the left side. 4. Exostosis arising from the lateral left ischium measuring 2.1 x 1.3 cm. Electronically Signed   By: Lowella Grip III M.D.   On: 08/17/2020 09:53    I independently reviewed the above  imaging studies.  Impression/Recommendation: 1. Emphysematous cystitis and emphysematous left pyelitis: Seen on CT A/P 08/17/2020.  WBC 29.6 on 08/18/2020. Urine cx pending. 2. Sepsis secondary to emphysematous cystitis and left pyelitis.  Febrile with leukocytosis and lactic acidosis upon presentation.  Improving with broad-spectrum antibiotics. 3. AKI on CKD 3: Creatinine 2.16 on 10/7 from baseline of 1.3. 4. History of type 2 diabetes.  Present with hyperglycemia.  431 upon initial presentation.  a. To OR for cysto, L RPG, L stent placement, foley placement b. Continue broad spectrum abx c. Ordered urine culture.  This has not been done. d. Will need f/u imaging in 48 hours to assess for interval improvement e. If fails to improve with stent, may need to proceed with left percutaneous nephrostomy tube. f. Hoping to avoid nephrectomy or cystectomy in this elderly patient with multiple comorbidities.  [name] 08/18/2020, 3:39 PM  Matt R. Doyel Mulkern MD [facility]  Pager: [phone]   CC: Dr. Aldine Contes, MD

## 2020-08-18 NOTE — Progress Notes (Signed)
Inpatient Diabetes Program Recommendations  AACE/ADA: New Consensus Statement on Inpatient Glycemic Control (2015)  Target Ranges:  Prepandial:   less than 140 mg/dL      Peak postprandial:   less than 180 mg/dL (1-2 hours)      Critically ill patients:  140 - 180 mg/dL   Lab Results  Component Value Date   GLUCAP 197 (H) 08/18/2020   HGBA1C 7.8 (H) 08/18/2020    Review of Glycemic Control  Diabetes history: DM 2 Outpatient Diabetes medications: Lantus 6 units qhs Current orders for Inpatient glycemic control: Lantus 3 units qhs Novolog 0-9 units tid  Consult for: Education on proper adherence to blood glucose checks and insulin administration  A1c 7.8% at goal for age  Glucose 357 on presentation. In context of pt coming in with N/V and abdominal pain, pt probably did not take insulin out of fear of having hypoglycemia, which is not uncommon. Pt bed bound at home has good adherence to insulin and management at home according to A1c level. To follow up outpt with PCP. May need to work with PCP to come up with sick day plan for future (checking CBGs more often and take a portion of badal insulin if uncomfortable with full dose.  No other needs at this time. Will sign off.  Thanks, Tama Headings RN, MSN, BC-ADM Inpatient Diabetes Coordinator Team Pager [phone] (8a-5p)

## 2020-08-18 NOTE — Progress Notes (Signed)
MD to bedside to exam patient.Plan transfer patient to higher level of care.

## 2020-08-18 NOTE — Evaluation (Signed)
Clinical/Bedside Swallow Evaluation Patient Details  Name: Whitney [name] MRN: [id number] Date of Birth: Apr 27, 1935  Today's Date: 08/18/2020 Time: SLP Start Time (ACUTE ONLY): 1024 SLP Stop Time (ACUTE ONLY): 1053 SLP Time Calculation (min) (ACUTE ONLY): 29 min  Past Medical History:  Past Medical History:  Diagnosis Date  . Diabetes mellitus without complication [facility])    Past Surgical History:  Past Surgical History:  Procedure Laterality Date  . TOTAL HIP ARTHROPLASTY Right 03/05/2020   Procedure: HIP GIRDLE STONE;  Surgeon: Leandrew Koyanagi, MD;  Location: [place];  Service: Orthopedics;  Laterality: Right;   HPI:  Pt is an 85 yo female presenting with several weeks of N/V and abdominal pain, admitted with possible sepsis. CT abdomen suggestive of cystitis and L-sided pyelitis. PMH: DM, CKD stage III, chronic R hip fx (bedbound at baseline)   Assessment / Plan / Recommendation Clinical Impression  Pt is limited in large part by pain and nausea. Arabic interpreter was used initially Deatra Canter [phone]), but because she speaks [place], he transferred [place] to another interpreter (Husam #[id number]). Pt has significant pain when trying to elevate the HOB at all, asking for water and acknowledging that she needs to be more upright, but then yelling out in pain and asking to be lowered even further than she initially had been positioned. SLP ended up using reverse Trendelenburg to get her as upright as possible, also providing support behind her shoulders/head. Pt took two sips of water that triggered immediate coughing and then regurgitation. She had additional heaving with expectoration of secretions as well and declined any further PO trials. She immediately wanted to lie flat again. Pt does not appear to be ready for a PO diet. Suspect that her pain and regurgitation pose the most significant risk for aspiration, although it is difficult to assess for any additional underlying oropharyngeal dysphagia with such  limited trials.   SLP Visit Diagnosis: Dysphagia, unspecified (R13.10)    Aspiration Risk  Moderate aspiration risk    Diet Recommendation NPO   Medication Administration: Via alternative means    Other  Recommendations Oral Care Recommendations: Oral care QID Other Recommendations: Have oral suction available   Follow up Recommendations  (tba)      Frequency and Duration min 2x/week  2 weeks       Prognosis Prognosis for Safe Diet Advancement: Good Barriers to Reach Goals: Other (Comment) (medical issues, pain)      Swallow Study   General HPI: Pt is an 85 yo female presenting with several weeks of N/V and abdominal pain, admitted with possible sepsis. CT abdomen suggestive of cystitis and L-sided pyelitis. PMH: DM, CKD stage III, chronic R hip fx (bedbound at baseline) Type of Study: Bedside Swallow Evaluation Previous Swallow Assessment: none in chart Diet Prior to this Study: NPO Temperature Spikes Noted: No Respiratory Status: Nasal cannula History of Recent Intubation: No Behavior/Cognition: Alert;Requires cueing;Cooperative Oral Cavity Assessment: Other (comment) (coating on tongue) Oral Care Completed by SLP: No Oral Cavity - Dentition: Poor condition;Missing dentition Vision: Functional for self-feeding Self-Feeding Abilities: Needs assist Patient Positioning: Partially reclined (primarily had to use reverse Trendelenburg) Baseline Vocal Quality: Normal Volitional Cough: Strong    Oral/Motor/Sensory Function Overall Oral Motor/Sensory Function: Within functional limits   Ice Chips Ice chips: Not tested   Thin Liquid Thin Liquid: Impaired Presentation: Straw Pharyngeal  Phase Impairments: Cough - Immediate    Nectar Thick Nectar Thick Liquid: Not tested   Honey Thick Honey Thick Liquid: Not tested  Puree Puree: Not tested   Solid     Solid: Not tested      [name]., M.A. [place] Pager [phone] Office  [phone]  08/18/2020,10:58 AM

## 2020-08-19 DIAGNOSIS — N3080 Other cystitis without hematuria: Secondary | ICD-10-CM

## 2020-08-19 LAB — CBC WITH DIFFERENTIAL/PLATELET
Abs Immature Granulocytes: 0.3 10*3/uL — ABNORMAL HIGH (ref 0.00–0.07)
Basophils Absolute: 0 10*3/uL (ref 0.0–0.1)
Basophils Relative: 0 %
Eosinophils Absolute: 0.1 10*3/uL (ref 0.0–0.5)
Eosinophils Relative: 0 %
HCT: 28.4 % — ABNORMAL LOW (ref 36.0–46.0)
Hemoglobin: 9 g/dL — ABNORMAL LOW (ref 12.0–15.0)
Immature Granulocytes: 2 %
Lymphocytes Relative: 4 %
Lymphs Abs: 0.9 10*3/uL (ref 0.7–4.0)
MCH: 29.3 pg (ref 26.0–34.0)
MCHC: 31.7 g/dL (ref 30.0–36.0)
MCV: 92.5 fL (ref 80.0–100.0)
Monocytes Absolute: 1.1 10*3/uL — ABNORMAL HIGH (ref 0.1–1.0)
Monocytes Relative: 5 %
Neutro Abs: 17.7 10*3/uL — ABNORMAL HIGH (ref 1.7–7.7)
Neutrophils Relative %: 89 %
Platelets: 182 10*3/uL (ref 150–400)
RBC: 3.07 MIL/uL — ABNORMAL LOW (ref 3.87–5.11)
RDW: 15.6 % — ABNORMAL HIGH (ref 11.5–15.5)
WBC: 20 10*3/uL — ABNORMAL HIGH (ref 4.0–10.5)
nRBC: 0 % (ref 0.0–0.2)

## 2020-08-19 LAB — GLUCOSE, CAPILLARY
Glucose-Capillary: 166 mg/dL — ABNORMAL HIGH (ref 70–99)
Glucose-Capillary: 186 mg/dL — ABNORMAL HIGH (ref 70–99)
Glucose-Capillary: 189 mg/dL — ABNORMAL HIGH (ref 70–99)
Glucose-Capillary: 260 mg/dL — ABNORMAL HIGH (ref 70–99)
Glucose-Capillary: 247 mg/dL — ABNORMAL HIGH (ref 70–99)

## 2020-08-19 LAB — CULTURE, BLOOD (ROUTINE X 2)

## 2020-08-19 LAB — URINE CULTURE

## 2020-08-19 LAB — COMPREHENSIVE METABOLIC PANEL
ALT: 12 U/L (ref 0–44)
AST: 21 U/L (ref 15–41)
Albumin: 1.8 g/dL — ABNORMAL LOW (ref 3.5–5.0)
Alkaline Phosphatase: 126 U/L (ref 38–126)
Anion gap: 12 (ref 5–15)
BUN: 48 mg/dL — ABNORMAL HIGH (ref 8–23)
CO2: 17 mmol/L — ABNORMAL LOW (ref 22–32)
Calcium: 7.8 mg/dL — ABNORMAL LOW (ref 8.9–10.3)
Chloride: 109 mmol/L (ref 98–111)
Creatinine, Ser: 2.11 mg/dL — ABNORMAL HIGH (ref 0.44–1.00)
GFR, Estimated: 21 mL/min — ABNORMAL LOW (ref >60)
Glucose, Bld: 259 mg/dL — ABNORMAL HIGH (ref 70–99)
Potassium: 4.8 mmol/L (ref 3.5–5.1)
Sodium: 138 mmol/L (ref 135–145)
Total Bilirubin: 1.2 mg/dL (ref 0.3–1.2)
Total Protein: 5.3 g/dL — ABNORMAL LOW (ref 6.5–8.1)

## 2020-08-19 LAB — PHOSPHORUS: Phosphorus: 3.7 mg/dL (ref 2.5–4.6)

## 2020-08-19 LAB — MAGNESIUM: Magnesium: 2.1 mg/dL (ref 1.7–2.4)

## 2020-08-19 MED ORDER — LACTATED RINGERS IV SOLN: INTRAVENOUS | Status: AC

## 2020-08-19 MED ORDER — ACETAMINOPHEN 325 MG PO TABS
650.0000 mg | ORAL_TABLET | Freq: Four times a day (QID) | ORAL | Status: DC | PRN
  Administered 2020-08-19 – 2020-08-27 (×5): 650 mg via ORAL
  Filled 2020-08-19 (×5): qty 2

## 2020-08-19 MED ORDER — HYDROMORPHONE HCL 1 MG/ML IJ SOLN
0.5000 mg | INTRAMUSCULAR | Status: DC | PRN
  Administered 2020-08-21: 0.5 mg via INTRAVENOUS
  Filled 2020-08-19: qty 1

## 2020-08-19 MED ORDER — DEXTROSE-NACL 5-0.45 % IV SOLN: INTRAVENOUS | Status: DC

## 2020-08-19 NOTE — Progress Notes (Signed)
1 Day Post-Op Subjective: C/o abdominal pain, controlled. No nausea or emesis. Tolerating foley. Afebrile overnight.  Objective: Vital signs in last 24 hours: Temp:  [97.2 F (36.2 C)-98.1 F (36.7 C)] 97.6 F (36.4 C) (10/09 0755) Pulse Rate:  [60-117] 94 (10/09 0755) Resp:  [15-24] 15 (10/09 0755) BP: (101-150)/(41-89) 101/46 (10/09 0755) SpO2:  [92 %-100 %] 92 % (10/09 0755) Weight:  [54.4 kg-86.4 kg] 86.4 kg (10/09 0500)  Intake/Output from previous day: 10/08 0701 - 10/09 0700 In: 210 [I.V.:210] Out: 275 [Urine:275] Intake/Output this shift: No intake/output data recorded.   UOP: 333ml yellow in urometer  Physical Exam:  General: Alert and oriented CV: RRR Lungs: Clear Abdomen: Soft, ND, ATTP Ext: NT, No erythema  Lab Results: Recent Labs    08/17/20 0851 08/18/20 0135 08/18/20 0500  HGB 29.9* 9.1* DUPLICATE  HCT 37.1 69.6* DUPLICATE   BMET Recent Labs    08/17/20 1411 08/18/20 0135  NA 135 136  K 4.6 4.3  CL 108 108  CO2 17* 19*  GLUCOSE 431* 265*  BUN 49* 48*  CREATININE 2.03* 2.07*  CALCIUM 7.4* 7.5*     Studies/Results: CT ABDOMEN PELVIS WO CONTRAST  Result Date: 08/17/2020 CLINICAL DATA:  Acute onset abdominal pain.  Leukocytosis and fever. EXAM: CT ABDOMEN AND PELVIS WITHOUT CONTRAST TECHNIQUE: Multidetector CT imaging of the abdomen and pelvis was performed following the standard protocol without IV contrast. COMPARISON:  None. FINDINGS: Lower chest: Small bilateral pleural effusions. No pericardial effusion. There is some dependent atelectasis. Mild bronchiectasis in the lower lobes bilaterally. Hepatobiliary: 2 stones are seen in the gallbladder measuring up to 0.8 cm. No gallbladder wall thickening or pericholecystic fluid. Biliary tree and liver appear normal. Pancreas: Marked fatty atrophy of the pancreas.  No focal lesion. Spleen: Normal in size without focal abnormality. Adrenals/Urinary Tract: There is gas in the left intrarenal  collecting system and ureter. Gas is also seen within the lumen and wall of the urinary bladder. Left renal cyst is unchanged. The right kidney and ureter are unremarkable. The adrenal glands appear normal. Stomach/Bowel: Stomach is within normal limits. Appendix appears normal. No evidence of bowel wall thickening, distention, or inflammatory changes. Vascular/Lymphatic: No significant vascular findings are present. No enlarged abdominal or pelvic lymph nodes. Reproductive: Endometrial stripe appears thickened at 0.9 cm. No adnexal mass. Other: There is body wall edema. Small volume of ascites is present in the abdomen. No focal fluid collection. Musculoskeletal: No acute bony abnormality. Remote lumbar compression fractures are unchanged. The patient is status post left hip replacement. The right femoral head and neck has been resected or have as on the prior exam. 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 Small bilateral pleural effusions, body wall edema and small volume of ascites compatible with volume overload. Gallstones without evidence of cholecystitis. Electronically Signed   By: Inge Rise M.D.   On: 08/17/2020 13:42   DG Chest 1 View  Result Date: 08/17/2020 CLINICAL DATA:  Pain EXAM: CHEST  1 VIEW COMPARISON:  March 09, 2020 FINDINGS: There is scarring with minimal pleural effusion in the left base, stable. Interstitial thickening bilaterally is stable. There is no frank edema or consolidation. There is cardiomegaly with pulmonary vascularity within normal limits. No adenopathy. There is aortic atherosclerosis. Bones are diffusely osteoporotic. Chronic appearing fracture proximal left humerus again noted. IMPRESSION: Scarring with minimal pleural effusion left base. Interstitial thickening may indicate underlying chronic bronchitis. No frank edema or consolidation. Stable cardiomegaly. Bones osteoporotic.  Old fracture  left proximal humerus, nonunited. Aortic  Atherosclerosis (ICD10-I70.0). Electronically Signed   By: Lowella Grip III M.D.   On: 08/17/2020 09:56   DG Retrograde Pyelogram  Result Date: 08/18/2020 CLINICAL DATA:  Retrograde pyelogram, intraoperative examination EXAM: DG C-ARM 1-60 MIN; RETROGRADE PYELOGRAM CONTRAST:  Not indicated, refer to operative report FLUOROSCOPY TIME:  Fluoroscopy Time:  27 seconds Radiation Exposure Index (if provided by the fluoroscopic device): Not listed Number of Acquired Spot Images: 1 COMPARISON:  CT 1:12 p.m. FINDINGS: Single image demonstrates contrast within a decompressed left renal collecting system with a catheter advanced into the upper pole compound calyx. No extravasation of contrast. No intraluminal filling defect identified within the opacified segment. The pelvis is incompletely opacified and the ureter is not opacified. Inferior vena cava filter is incidentally noted. IMPRESSION: Intraoperative examination as described above. Electronically Signed   By: Fidela Salisbury MD   On: 08/18/2020 20:52   DG Humerus Left  Result Date: 08/17/2020 CLINICAL DATA:  Pain EXAM: LEFT HUMERUS - 2+ VIEW COMPARISON:  None. FINDINGS: Frontal and lateral views obtained. There is a suspected chronic comminuted fracture at the left proximal humeral metaphysis with marked medial displacement and lateral angulation of the distal major fracture fragment with respect proximal major fragment. No dislocation. Bones osteoporotic. There is extensive arthropathy in the left shoulder joint. Visualized left lung clear. : Suspect chronic fracture at the level of the proximal humeral metaphysis on the left with displacement and angulation at the fracture site. Bones are diffusely osteoporotic. No dislocation. Extensive arthropathy left shoulder region. Electronically Signed   By: Lowella Grip III M.D.   On: 08/17/2020 09:55   DG C-Arm 1-60 Min  Result Date: 08/18/2020 CLINICAL DATA:  Retrograde pyelogram, intraoperative  examination EXAM: DG C-ARM 1-60 MIN; RETROGRADE PYELOGRAM CONTRAST:  Not indicated, refer to operative report FLUOROSCOPY TIME:  Fluoroscopy Time:  27 seconds Radiation Exposure Index (if provided by the fluoroscopic device): Not listed Number of Acquired Spot Images: 1 COMPARISON:  CT 1:12 p.m. FINDINGS: Single image demonstrates contrast within a decompressed left renal collecting system with a catheter advanced into the upper pole compound calyx. No extravasation of contrast. No intraluminal filling defect identified within the opacified segment. The pelvis is incompletely opacified and the ureter is not opacified. Inferior vena cava filter is incidentally noted. IMPRESSION: Intraoperative examination as described above. Electronically Signed   By: Fidela Salisbury MD   On: 08/18/2020 20:52   DG Hip Unilat W or Wo Pelvis 2-3 Views Left  Result Date: 08/17/2020 CLINICAL DATA:  Pain EXAM: DG HIP (WITH OR WITHOUT PELVIS) 2-3V LEFT COMPARISON:  Right hip images March 05, 2020; pelvis image March 02, 2020 FINDINGS: Frontal pelvis and lateral left hip images were obtained. Bones are diffusely osteoporotic. There is a chronic fracture of the right subcapital femoral neck with superior and lateral displacement of the right femoral shaft with respect to the right femoral head. There is a total hip replacement on the left with prosthetic components appearing well-seated. Cement noted medial to the left hip joint. No left-sided hip region fracture is demonstrated. No dislocation on the left. Exostosis arises from the lateral ischium measuring 2.1 x 1.3 cm. IMPRESSION: 1.  Severe osteoporosis. 2. Chronic fracture right subcapital femoral neck region with chronic displacement of the right femoral shaft laterally and superiorly with respect to the right femoral head. 3. Total hip replacement on the left with prosthetic components appearing well-seated. No acute fracture or dislocation on the left side. 4.  Exostosis arising  from the lateral left ischium measuring 2.1 x 1.3 cm. Electronically Signed   By: Lowella Grip III M.D.   On: 08/17/2020 09:53    Assessment/Plan: 1. Emphysematous cystitis and emphysematous left pyelitis: Seen on CT A/P 08/17/2020.  WBC 29.6 on 08/18/2020. Urine cx pending. S/p cysto/L stent/foley 08/18/2020. 2. Sepsis secondary to emphysematous cystitis and left pyelitis.  Febrile with leukocytosis and lactic acidosis upon presentation.  Improving with broad-spectrum antibiotics and GU decompression. 3. AKI on CKD 3: Creatinine 2.16 on 10/7 from baseline of 1.3. 4. History of type 2 diabetes.  Present with hyperglycemia.  431 upon initial presentation.  a. Foley to gravity. Marginal UOP. 380ml yellow in urometer. Discussed with RN to bladder scan to ensure emptying well. May need to upsize foley. Irrigate catheter prn for decreased drainage or debris. b. Continue broad spectrum abx c. F/u urine cx and L RP urine cx d. Will obtain repeat CT tomorrow to eval for improvement e. May need L PCN for maximal drainage f. Hoping to avoid nephrectomy/cystectomy g. Discussed with daughter seriousness of condition at bedside. All questions answered to her satisfaction.   LOS: 2 days   Whitney [name] 08/19/2020, 9:39 AM [facility]  Pager: [phone]

## 2020-08-19 NOTE — Progress Notes (Signed)
Verbal orders from Dr. Abner Greenspan to bladder scan pt and irrigate foley. Pt bladder scannedx3 showing 68mL each time. 40 mL of normal saline used to irrigate foley.   Justice Rocher, RN

## 2020-08-19 NOTE — Progress Notes (Signed)
275 mL cloudy, amber urine emptied from pt's foley. D5 1/2 NS discontinued per order.   Justice Rocher, RN

## 2020-08-19 NOTE — Progress Notes (Signed)
  Speech Language Pathology Treatment: Dysphagia  Patient Details Name: Whitney [name] MRN: [id number] DOB: [birth year] Today's Date: 08/19/2020 Time: 8453-6468 SLP Time Calculation (min) (ACUTE ONLY): 18 min  Assessment / Plan / Recommendation Clinical Impression  Pt was seen for skilled ST targeting diagnostic treatment.  She was encountered awake/alert, lying flat in bed with daughter at bedside.  Attempted to use [place] interpreter, but daughter stated to use Arabic interpreter instead.  Arabic interpreter (May #[id number]) was initially used, but pt stated that she was unable to hear the video interpreter despite the Ipad being at max volume and being placed as close as possible to the bed.  [employer] interpreter was discontinued and daughter interpreted for the patient.  She was able to tolerate her HOB being elevated to 27 degrees with use of reverse trendelenburg.  She consumed trials of thin liquid via tsp/straw and puree.  She exhibited a delayed cough following the first sip of thin liquid via tsp, but she did not exhibit any additional clinical s/sx of aspiration with 5 sips of thin liquid via straw sip.  She also consumed 2oz of puree without overt s/sx of aspiration.  A second swallow was noted intermittently during puree trials (possibly indicative of pharyngeal residue or esophageal dysfunction) and eructation was observed x1 with thin liquid trials.  Recommend initiation of Dysphagia 1 (puree) solids and thin liquids with medications administered crushed in puree.  Pt must be elevated as much as possible for all PO intake.  Additionally recommend small bites/sips, slow rate of intake, and continued elevation for at least 30 minutes following PO intake.  SLP will f/u to monitor diet tolerance and to assess readiness for solid advancement.     HPI HPI: Pt is an 85 yo female presenting with several weeks of N/V and abdominal pain, admitted with possible sepsis. CT abdomen suggestive of cystitis and  L-sided pyelitis. PMH: DM, CKD stage III, chronic R hip fx (bedbound at baseline)      SLP Plan  Goals updated       Recommendations  Diet recommendations: Dysphagia 1 (puree);Thin liquid Liquids provided via: Straw;Cup Medication Administration: Crushed with puree Supervision: Staff to assist with self feeding;Full supervision/cueing for compensatory strategies Compensations: Minimize environmental distractions;Slow rate;Small sips/bites Postural Changes and/or Swallow Maneuvers: Upright 30-60 min after meal (Seated upright as possible )                Oral Care Recommendations: Oral care BID Follow up Recommendations: 24 hour supervision/assistance;Skilled Nursing facility SLP Visit Diagnosis: Dysphagia, unspecified (R13.10) Plan: Goals updated       GO               Colin Mulders., M.S., [facility] Office: [phone]   [place] 08/19/2020, 11:34 AM

## 2020-08-19 NOTE — Progress Notes (Signed)
Went to evaluate patient at bedside following cystoscopy with left ureteral stent placement and foley catheter placement. Patient was laying in bed and appeared comfortable, Arabic translator was used, patient did not answer questions, repeated the words "tired" and "enough". On chart review it appears that patient speaks [place]. Spoke with daughter later during the night who states that patient was saying she was in pain and requesting pain medications. Daughter felt that patient was answering questions appropriately and knew who she was talking to.  Vitals were stable, afebrile, HR around 100, RR in mid 20s, and BP stable around 107/89. On 4L with simple mask oxygenating well. On exam she was tachycardic, had minimal bibasilar crackles, and 2+ BL pitting edema.  Overall patient appears stable at this time with some mild volume overload. She has required fluid boluses while admitted for her hypotension 2/2 sepsis and she is not currently receiving any fluid. Will continue to monitor respiratory status for now. Will hold off on diuresis due to her soft blood pressures.   -Add tylenol PRN for pain control -Monitor vitals -Continue Merrem for ESBL bacteremia

## 2020-08-19 NOTE — Progress Notes (Addendum)
Subjective: ON: Night Team was called as patient was screaming out and was told that pain was in pain via translation from Whitney [name].   Whitney Whitney [name]'s Whitney [name] is at bedside and translates for her mother. Whitney [name] reports that Whitney Whitney [name] is having pain on her lower left side.   Objective:  Vital signs in last 24 hours: Vitals:   08/18/20 2334 08/19/20 0310 08/19/20 0342 08/19/20 0500  BP: 108/61 (!) 117/55 (!) 117/54   Pulse: 100 96 97   Resp: (!) 22 19 20    Temp: 98 F (36.7 C) 98.1 F (36.7 C)    TempSrc: Axillary Axillary    SpO2: 98% 100% 93%   Weight:    86.4 kg  Height:       Weight change: 0 kg  Intake/Output Summary (Last 24 hours) at 08/19/2020 0837 Last data filed at 08/19/2020 0516 Gross per 24 hour  Intake 210 ml  Output 275 ml  Net -65 ml   . Physical Exam Constitutional:      General: She is in acute distress.     Appearance: She is ill-appearing.     Comments: Patient lying in bed, moaning and calling out "why". Calm when Whitney [name] is at bedside  HENT:     Mouth/Throat:     Mouth: Mucous membranes are dry.  Eyes:     Extraocular Movements: Extraocular movements intact.  Cardiovascular:     Rate and Rhythm: Normal rate and regular rhythm.     Pulses:          Radial pulses are 1+ on the right side and 1+ on the left side.     Heart sounds: Normal heart sounds.  Pulmonary:     Effort: Pulmonary effort is normal.     Breath sounds: Normal breath sounds.  Abdominal:     Palpations: There is mass.     Tenderness: There is abdominal tenderness. There is no guarding or rebound.  Musculoskeletal:     Right lower leg: 1+ Pitting Edema present.     Left lower leg: 1+ Pitting Edema present.  Neurological:     Mental Status: She is alert.     Assessment/Plan:  Active Problems:   Sepsis ([place])   Pyelonephritis   Pressure injury of skin  Whitney Whitney [name] is 85 yo female with a medical history of insulin-dependent diabetes, CKD stage III, and chronic right hip  fracture that presented to [facility] with 3 week history of nausea and vomiting complicated by the development of abdominal pain, dysuria found to have septic shock secondary to cystitis and pyelitis.  Urosepsis 2/2 ESBL producing Klesbiella Pneumonaie bacterermia Patient present with septic shock 2/2 emphysematous cystitis and left pyelitis. Day 2 of Meropenem. BP has been fluctuating, but seems to have stabilized over the last 24hrs. We will continue with antibiotic treatment, follow up on urine culture and monitor vitals/physical exam/labs. --continue Meropenem  --f/u urine culture --CBCw/diff - Tylenol for moderate pain - .5 dilaudid for severe pain   Emphysematous Cytitis/ Left Pyelitis  S/p 1 day Left Ureteral Stent placement and Foley Catheter drainage. Urology recommends f/u CT imaging to assess for improvement with stents, and if failure to improvement, she may need percutaneous nephrostomy tube. We will continue to monitor her physical exam and foley output.  --Urology following, recommendations appreciated --CT Abdomen/Pelvis imaging tomorrow  --monitor I/Os   CKD Stage III AKIv ATN Patient with a history of CKDIII presented with Creatinine of 2.16, 1.3 at baseline. Most likely prerenal due to  dehydration from vomiting and lack of oral intake. We will start on dysphasia 1 diet.  --CMP pending    Diabetes Mellitus Type 2 A1c 7.8. Patient presented with hyperglycemia, BG 431. Her Whitney [name] helps administer basal insulin at home. We are administering Lantus 3 units with SSI. Whitney [name] spoke with Diabetes Care Coordinator about diabetes education --CBG monitoring --SSI sensitive  --Lantus 3 units QHS  Code status: Full Code VTE ppx: Heparin    LOS: 2 days   Jacklynn Bue, Medical Student 08/19/2020, 8:37 AM   Attestation for Student Documentation:  I personally was present and performed or re-performed the history, physical exam and medical decision-making activities of this  service and have verified that the service and findings are accurately documented in the student's note.  [name], MD 08/19/2020, 5:09 PM

## 2020-08-20 ENCOUNTER — Inpatient Hospital Stay (HOSPITAL_COMMUNITY): Payer: 59

## 2020-08-20 DIAGNOSIS — N179 Acute kidney failure, unspecified: Secondary | ICD-10-CM

## 2020-08-20 LAB — GLUCOSE, CAPILLARY
Glucose-Capillary: 116 mg/dL — ABNORMAL HIGH (ref 70–99)
Glucose-Capillary: 192 mg/dL — ABNORMAL HIGH (ref 70–99)
Glucose-Capillary: 243 mg/dL — ABNORMAL HIGH (ref 70–99)
Glucose-Capillary: 272 mg/dL — ABNORMAL HIGH (ref 70–99)
Glucose-Capillary: 277 mg/dL — ABNORMAL HIGH (ref 70–99)
Glucose-Capillary: 307 mg/dL — ABNORMAL HIGH (ref 70–99)

## 2020-08-20 LAB — COMPREHENSIVE METABOLIC PANEL
ALT: 12 U/L (ref 0–44)
AST: 20 U/L (ref 15–41)
Albumin: 1.5 g/dL — ABNORMAL LOW (ref 3.5–5.0)
Alkaline Phosphatase: 120 U/L (ref 38–126)
Anion gap: 6 (ref 5–15)
BUN: 51 mg/dL — ABNORMAL HIGH (ref 8–23)
CO2: 21 mmol/L — ABNORMAL LOW (ref 22–32)
Calcium: 7.8 mg/dL — ABNORMAL LOW (ref 8.9–10.3)
Chloride: 111 mmol/L (ref 98–111)
Creatinine, Ser: 1.94 mg/dL — ABNORMAL HIGH (ref 0.44–1.00)
GFR, Estimated: 23 mL/min — ABNORMAL LOW (ref >60)
Glucose, Bld: 302 mg/dL — ABNORMAL HIGH (ref 70–99)
Potassium: 4.4 mmol/L (ref 3.5–5.1)
Sodium: 138 mmol/L (ref 135–145)
Total Bilirubin: 0.7 mg/dL (ref 0.3–1.2)
Total Protein: 5.1 g/dL — ABNORMAL LOW (ref 6.5–8.1)

## 2020-08-20 LAB — CBC WITH DIFFERENTIAL/PLATELET
Abs Immature Granulocytes: 0.27 10*3/uL — ABNORMAL HIGH (ref 0.00–0.07)
Basophils Absolute: 0 10*3/uL (ref 0.0–0.1)
Basophils Relative: 0 %
Eosinophils Absolute: 0 10*3/uL (ref 0.0–0.5)
Eosinophils Relative: 0 %
HCT: 25.5 % — ABNORMAL LOW (ref 36.0–46.0)
Hemoglobin: 8.2 g/dL — ABNORMAL LOW (ref 12.0–15.0)
Immature Granulocytes: 1 %
Lymphocytes Relative: 5 %
Lymphs Abs: 0.9 10*3/uL (ref 0.7–4.0)
MCH: 29.6 pg (ref 26.0–34.0)
MCHC: 32.2 g/dL (ref 30.0–36.0)
MCV: 92.1 fL (ref 80.0–100.0)
Monocytes Absolute: 0.8 10*3/uL (ref 0.1–1.0)
Monocytes Relative: 4 %
Neutro Abs: 16.9 10*3/uL — ABNORMAL HIGH (ref 1.7–7.7)
Neutrophils Relative %: 90 %
Platelets: 178 10*3/uL (ref 150–400)
RBC: 2.77 MIL/uL — ABNORMAL LOW (ref 3.87–5.11)
RDW: 15.5 % (ref 11.5–15.5)
WBC: 18.9 10*3/uL — ABNORMAL HIGH (ref 4.0–10.5)
nRBC: 0 % (ref 0.0–0.2)

## 2020-08-20 LAB — CULTURE, BLOOD (ROUTINE X 2): Special Requests: ADEQUATE

## 2020-08-20 MED ORDER — LACTATED RINGERS IV SOLN: INTRAVENOUS | Status: AC

## 2020-08-20 MED ORDER — INSULIN ASPART 100 UNIT/ML ~~LOC~~ SOLN: 0.0000 [IU] | Freq: Every day | SUBCUTANEOUS | Status: DC

## 2020-08-20 MED ORDER — INSULIN ASPART 100 UNIT/ML ~~LOC~~ SOLN
3.0000 [IU] | Freq: Three times a day (TID) | SUBCUTANEOUS | Status: DC
  Administered 2020-08-20: 3 [IU] via SUBCUTANEOUS

## 2020-08-20 MED ORDER — INSULIN GLARGINE 100 UNIT/ML ~~LOC~~ SOLN
5.0000 [IU] | Freq: Every day | SUBCUTANEOUS | Status: DC
  Administered 2020-08-20 – 2020-08-22 (×3): 5 [IU] via SUBCUTANEOUS
  Filled 2020-08-20 (×4): qty 0.05

## 2020-08-20 MED ORDER — BUDESONIDE 0.25 MG/2ML IN SUSP: 0.2500 mg | Freq: Two times a day (BID) | RESPIRATORY_TRACT | Status: DC

## 2020-08-20 MED ORDER — INSULIN ASPART 100 UNIT/ML ~~LOC~~ SOLN
0.0000 [IU] | Freq: Three times a day (TID) | SUBCUTANEOUS | Status: DC
  Administered 2020-08-20: 3 [IU] via SUBCUTANEOUS
  Administered 2020-08-21: 2 [IU] via SUBCUTANEOUS

## 2020-08-20 MED ORDER — INSULIN ASPART 100 UNIT/ML ~~LOC~~ SOLN
0.0000 [IU] | Freq: Three times a day (TID) | SUBCUTANEOUS | Status: DC
  Administered 2020-08-20: 8 [IU] via SUBCUTANEOUS
  Administered 2020-08-20: 11 [IU] via SUBCUTANEOUS

## 2020-08-20 NOTE — Progress Notes (Signed)
2 Days Post-Op Subjective: C/o abdominal pain, controlled. No nausea or emesis. Tolerating foley. Afebrile overnight. However last temp 97.4.  Objective: Vital signs in last 24 hours: Temp:  [97.4 F (36.3 C)-97.7 F (36.5 C)] 97.5 F (36.4 C) (10/10 0310) Pulse Rate:  [66-94] 73 (10/10 0318) Resp:  [15-24] 21 (10/10 0318) BP: (95-142)/(39-60) 109/54 (10/10 0318) SpO2:  [92 %-100 %] 100 % (10/10 0318) Weight:  [88.1 kg] 88.1 kg (10/10 0500)  Intake/Output from previous day: 10/09 0701 - 10/10 0700 In: 900 [P.O.:240; I.V.:420; IV Piggyback:200] Out: 525 [Urine:525] Intake/Output this shift: Total I/O In: -  Out: 250 [Urine:250]  Physical Exam:  General: Alert and oriented CV: RRR Lungs: Clear Abdomen: Soft, ND, ATTP Ext: NT, No erythema  Lab Results: Recent Labs    08/18/20 0500 08/19/20 2036 28/36/62 9476  HGB DUPLICATE 9.0* 8.2*  HCT DUPLICATE 54.6* 50.3*   BMET Recent Labs    08/19/20 1330 08/20/20 0350  NA 138 138  K 4.8 4.4  CL 109 111  CO2 17* 21*  GLUCOSE 259* 302*  BUN 48* 51*  CREATININE 2.11* 1.94*  CALCIUM 7.8* 7.8*     Studies/Results: DG Retrograde Pyelogram  Result Date: 08/18/2020 CLINICAL DATA:  Retrograde pyelogram, intraoperative examination EXAM: DG C-ARM 1-60 MIN; RETROGRADE PYELOGRAM CONTRAST:  Not indicated, refer to operative report FLUOROSCOPY TIME:  Fluoroscopy Time:  27 seconds Radiation Exposure Index (if provided by the fluoroscopic device): Not listed Number of Acquired Spot Images: 1 COMPARISON:  CT 1:12 p.m. FINDINGS: Single image demonstrates contrast within a decompressed left renal collecting system with a catheter advanced into the upper pole compound calyx. No extravasation of contrast. No intraluminal filling defect identified within the opacified segment. The pelvis is incompletely opacified and the ureter is not opacified. Inferior vena cava filter is incidentally noted. IMPRESSION: Intraoperative examination as  described above. Electronically Signed   By: Fidela Salisbury MD   On: 08/18/2020 20:52   DG C-Arm 1-60 Min  Result Date: 08/18/2020 CLINICAL DATA:  Retrograde pyelogram, intraoperative examination EXAM: DG C-ARM 1-60 MIN; RETROGRADE PYELOGRAM CONTRAST:  Not indicated, refer to operative report FLUOROSCOPY TIME:  Fluoroscopy Time:  27 seconds Radiation Exposure Index (if provided by the fluoroscopic device): Not listed Number of Acquired Spot Images: 1 COMPARISON:  CT 1:12 p.m. FINDINGS: Single image demonstrates contrast within a decompressed left renal collecting system with a catheter advanced into the upper pole compound calyx. No extravasation of contrast. No intraluminal filling defect identified within the opacified segment. The pelvis is incompletely opacified and the ureter is not opacified. Inferior vena cava filter is incidentally noted. IMPRESSION: Intraoperative examination as described above. Electronically Signed   By: Fidela Salisbury MD   On: 08/18/2020 20:52    Assessment/Plan: 1. Emphysematous cystitis and emphysematous left pyelitis:Seen on CT A/P 08/17/2020. WBC 29.6 on 08/18/2020. Urine cx multiple species present 10/7. L RP urine cx: few klebsiella 10/8. Blood cx 10/8: Klebsiella. S/p cysto/L stent/foley 08/18/2020. 2. Sepsis secondary to emphysematous cystitis and left pyelitis. Febrile with leukocytosis and lactic acidosis upon presentation. Improving with broad-spectrum antibiotics and GU decompression. 3. AKI on CKD 3:Creatinine 2.16 on 10/7 from baseline of 1.3. Improving. 4. History of type 2 diabetes.Present with hyperglycemia.431 upon initial presentation.  a. Foley to gravity. Marginal UOP. IM started MIVF and withdrew. Foley draining adequately. Irrigate prn. b. Continue merrem c. UCx and blood cx with Klebsiella d. Persistent AKI despite stent; however, cr downtrending e. WBC downtrending to 18 f. Hgb appropriate  at 8.2 g. Obtain CT A/P this AM to  re-evaluate h. May need L PCN for maximal drainage i. Hoping to avoid nephrectomy/cystectomy   LOS: 3 days   [name] 08/20/2020, 6:32 AM [facility]  Pager: [phone]

## 2020-08-20 NOTE — Plan of Care (Signed)
  Problem: Clinical Measurements: Goal: Will remain free from infection Outcome: Progressing  Patient afebrile this shift. Problem: Nutrition: Goal: Adequate nutrition will be maintained Outcome: Progressing  Pateint eats with assistance from daughter.

## 2020-08-20 NOTE — Progress Notes (Signed)
CT A/P reviewed. Read pending. Left collecting system gas resolved. Minimal intraluminal gas around decompressed bladder with foley in place. Likely avoid PCN at this point. Keep drains to gravity and broad spectrum abx.  [facility]  Pager: [phone]

## 2020-08-20 NOTE — Progress Notes (Addendum)
Subjective: ON: Night Team was called as patient was screaming out and was told that pain was in pain via translation from daughter.   Whitney [name]'s daughter is at bedside and translates for her mother.Whitney [name] is smiling and her daughter says she is feeling much better. She is very thankful to the team of physicians. We discussed we will follow up results of CT today and follow recommendations from Dr.Gay on if she needs any more procedures. Patient has some edema in her left hand from IV access and we will order warm compress.  Objective:  Vital signs in last 24 hours: Vitals:   08/20/20 0017 08/20/20 0310 08/20/20 0318 08/20/20 0500  BP: (!) 108/55 (!) 96/42 (!) 109/54   Pulse: 92 66 73   Resp: 17 17 (!) 21   Temp:  (!) 97.5 F (36.4 C)    TempSrc:  Axillary    SpO2: 100% 100% 100%   Weight:    88.1 kg  Height:       Weight change: 33.7 kg  Intake/Output Summary (Last 24 hours) at 08/20/2020 4193 Last data filed at 08/20/2020 0422 Gross per 24 hour  Intake 900 ml  Output 525 ml  Net 375 ml    General: Elderly female laying in bed, smiling , NAD,  Cardiovascular: Normal rate, regular rhythm.  No murmurs, rubs, or gallops Pulmonary : breath sounds normal. No wheezes, rales, or rhonchi Abdominal: soft, nontender,  bowel sounds present  Assessment/Plan:  Active Problems:   Sepsis ([place])   Pyelonephritis   Pressure injury of skin  Whitney [name] is 85 yo female with a medical history of insulin-dependent diabetes, CKD stage III, and chronic right hip fracture that presented to [facility] with 3 week history of nausea and vomiting complicated by the development of abdominal pain, dysuria found to have septic shock secondary to cystitis and pyelitis.  ESBL producing Klesbiella Pneumonaie bacteremia 2/2 to urinary source Patient presented with septic shock 2/2 Klebsiella bacteremia. Found to have cystitis / pylelitis which is likely source. Her BP is stable and she looks much improved on  exam. Her flank pain has improved and WBC is trending down. She is on day 4 of Meropenem.  -continue Meropenem  -- Trend CBCw/diff - Tylenol for moderate pain - .5 dilaudid for severe pain   Emphysematous Cytitis/ Left Pyelitis  S/p 2 day Left Ureteral Stent placement and Foley Catheter drainage. CT abdomen and pelvis wo contrast completed. Dr.Guy, urologist, recommends keeping drains to gravity and continuing antibiotics. No need for PCN at this point.  --Urology following, greatly appreciate assistance and recommendations.  --monitor I/Os - continue antibiotics as above  AKI on CKD Stage III Patient with a history of CKDIII presented with Creatinine of 2.16, 1.3 at baseline. Most likely a component of prerenal and postrenal. Stent placed for obstruction and patient improving with IV fluids. Will continue to give IV fluids and trend Cr.  --CMP   Diabetes Mellitus Type 2 A1c 7.8. Patient presented with hyperglycemia, BG 431. Her daughter helps administer basal insulin at home. We are administering Lantus 3 units with SSI. Blood sugar above goal today. Patient has resumed po intake.Will continue to trend and make adjustments --CBG monitoring -- Basal : Increase Lantus to 5 units QHS - 3 units aspart w/ meals , SSI -M, Correction scale QHS  Code status: Full Code VTE ppx: Heparin  [name], MD 08/20/2020, 6:44 AM Pager: [phone] After 5pm on weekdays and 1pm on weekends: On Call pager [phone]

## 2020-08-21 ENCOUNTER — Encounter (HOSPITAL_COMMUNITY): Payer: Self-pay | Admitting: Urology

## 2020-08-21 DIAGNOSIS — E8809 Other disorders of plasma-protein metabolism, not elsewhere classified: Secondary | ICD-10-CM

## 2020-08-21 LAB — ACID FAST SMEAR (AFB, MYCOBACTERIA): Acid Fast Smear: NEGATIVE

## 2020-08-21 LAB — CBC WITH DIFFERENTIAL/PLATELET
Abs Immature Granulocytes: 0.2 10*3/uL — ABNORMAL HIGH (ref 0.00–0.07)
Basophils Absolute: 0 10*3/uL (ref 0.0–0.1)
Basophils Relative: 0 %
Eosinophils Absolute: 0 10*3/uL (ref 0.0–0.5)
Eosinophils Relative: 0 %
HCT: 31.8 % — ABNORMAL LOW (ref 36.0–46.0)
Hemoglobin: 10.4 g/dL — ABNORMAL LOW (ref 12.0–15.0)
Immature Granulocytes: 1 %
Lymphocytes Relative: 11 %
Lymphs Abs: 1.8 10*3/uL (ref 0.7–4.0)
MCH: 29.5 pg (ref 26.0–34.0)
MCHC: 32.7 g/dL (ref 30.0–36.0)
MCV: 90.1 fL (ref 80.0–100.0)
Monocytes Absolute: 1.3 10*3/uL — ABNORMAL HIGH (ref 0.1–1.0)
Monocytes Relative: 8 %
Neutro Abs: 13.5 10*3/uL — ABNORMAL HIGH (ref 1.7–7.7)
Neutrophils Relative %: 80 %
Platelets: 124 10*3/uL — ABNORMAL LOW (ref 150–400)
RBC: 3.53 MIL/uL — ABNORMAL LOW (ref 3.87–5.11)
RDW: 15.8 % — ABNORMAL HIGH (ref 11.5–15.5)
WBC: 16.9 10*3/uL — ABNORMAL HIGH (ref 4.0–10.5)
nRBC: 0 % (ref 0.0–0.2)

## 2020-08-21 LAB — GLUCOSE, CAPILLARY
Glucose-Capillary: 101 mg/dL — ABNORMAL HIGH (ref 70–99)
Glucose-Capillary: 111 mg/dL — ABNORMAL HIGH (ref 70–99)
Glucose-Capillary: 128 mg/dL — ABNORMAL HIGH (ref 70–99)
Glucose-Capillary: 184 mg/dL — ABNORMAL HIGH (ref 70–99)
Glucose-Capillary: 118 mg/dL — ABNORMAL HIGH (ref 70–99)

## 2020-08-21 LAB — COMPREHENSIVE METABOLIC PANEL
ALT: 14 U/L (ref 0–44)
AST: 37 U/L (ref 15–41)
Albumin: 1.6 g/dL — ABNORMAL LOW (ref 3.5–5.0)
Alkaline Phosphatase: 180 U/L — ABNORMAL HIGH (ref 38–126)
Anion gap: 7 (ref 5–15)
BUN: 47 mg/dL — ABNORMAL HIGH (ref 8–23)
CO2: 21 mmol/L — ABNORMAL LOW (ref 22–32)
Calcium: 8 mg/dL — ABNORMAL LOW (ref 8.9–10.3)
Chloride: 111 mmol/L (ref 98–111)
Creatinine, Ser: 1.75 mg/dL — ABNORMAL HIGH (ref 0.44–1.00)
GFR, Estimated: 26 mL/min — ABNORMAL LOW (ref >60)
Glucose, Bld: 109 mg/dL — ABNORMAL HIGH (ref 70–99)
Potassium: 4.4 mmol/L (ref 3.5–5.1)
Sodium: 139 mmol/L (ref 135–145)
Total Bilirubin: 1 mg/dL (ref 0.3–1.2)
Total Protein: 5.8 g/dL — ABNORMAL LOW (ref 6.5–8.1)

## 2020-08-21 MED ORDER — LACTATED RINGERS IV BOLUS
500.0000 mL | Freq: Once | INTRAVENOUS | Status: AC
  Administered 2020-08-21: 500 mL via INTRAVENOUS

## 2020-08-21 MED ORDER — LACTATED RINGERS IV SOLN: INTRAVENOUS | Status: DC

## 2020-08-21 MED ORDER — SENNOSIDES-DOCUSATE SODIUM 8.6-50 MG PO TABS
1.0000 | ORAL_TABLET | Freq: Two times a day (BID) | ORAL | Status: DC
  Administered 2020-08-21 – 2020-08-30 (×13): 1 via ORAL
  Filled 2020-08-21 (×12): qty 1

## 2020-08-21 MED ORDER — PROSOURCE PLUS PO LIQD
30.0000 mL | Freq: Three times a day (TID) | ORAL | Status: DC
  Administered 2020-08-22 – 2020-08-31 (×21): 30 mL via ORAL
  Filled 2020-08-21 (×22): qty 30

## 2020-08-21 MED ORDER — HYDROMORPHONE HCL 1 MG/ML IJ SOLN
0.5000 mg | INTRAMUSCULAR | Status: DC | PRN
  Administered 2020-08-21 – 2020-08-30 (×8): 0.5 mg via INTRAVENOUS
  Filled 2020-08-21 (×7): qty 1

## 2020-08-21 MED ORDER — GLUCERNA SHAKE PO LIQD
237.0000 mL | Freq: Three times a day (TID) | ORAL | Status: DC
  Administered 2020-08-21 – 2020-08-31 (×28): 237 mL via ORAL

## 2020-08-21 MED ORDER — HYDROMORPHONE HCL 2 MG PO TABS
1.0000 mg | ORAL_TABLET | Freq: Four times a day (QID) | ORAL | Status: DC | PRN
  Administered 2020-08-21: 1 mg via ORAL
  Filled 2020-08-21: qty 1

## 2020-08-21 NOTE — Progress Notes (Signed)
Pharmacy Antibiotic Note  Whitney [name] is a 85 y.o. female admitted on 08/17/2020 with urosepsis.  Blood cultures growing ESBL producing Klebsiella pneumoniae  Pharmacy has been consulted for Meropenem.  SCr down 1.75 << 1.93, CrCl~20-30 ml/min. Current dosing of Meropenem is appropriate.   Plan: - Continue Meropenem 500 mg IV q12h - Will continue to follow renal function, culture results, LOT, and antibiotic de-escalation plans   Height: 5\' 3"  (160 cm) Weight: 85 kg (187 lb 6.3 oz) IBW/kg (Calculated) : 52.4  Temp (24hrs), Avg:97.7 F (36.5 C), Min:97.5 F (36.4 C), Max:97.8 F (36.6 C)  Recent Labs  Lab 08/17/20 0851 08/17/20 1020 08/17/20 1041 08/17/20 1411 08/18/20 0135 08/18/20 0500 08/18/20 0535 08/18/20 0846 08/18/20 1112 08/19/20 1330 08/19/20 2036 08/20/20 0350 08/21/20 0446  WBC   < >  --   --   --  48.8* DUPLICATE  --   --   --   --  20.0* 18.9* 16.9*  CREATININE  --    < >  --  2.03* 2.07*  --   --   --   --  2.11*  --  1.94* 1.75*  LATICACIDVEN  --   --  4.0*  --  3.2*  --  2.3* 1.6 2.1*  --   --   --   --    < > = values in this interval not displayed.    Estimated Creatinine Clearance: 25.1 mL/min (A) (by C-G formula based on SCr of 1.75 mg/dL (H)).    No Known Allergies  Vanc 10/7>>10/8 Cefepime 10/7>>10/8 Merrem 10/8>>  10/7 BCx: BCID ESBL Kleb pneumo.  Switching to Meropenem 10/8 UCx (cystoscopy) >> ESBL K PNA (S-Cipro/Imi/Zosyn) 10/7 UCx: sent 10/7 Cdiff PCR: sent  Thank you for allowing pharmacy to be a part of this patient's care.  Alycia Rossetti, PharmD, BCPS Clinical Pharmacist Clinical phone for 08/21/2020: [phone] 08/21/2020 2:05 PM   **Pharmacist phone directory can now be found on [email or web address] ([email or web address]).  Listed under [place].

## 2020-08-21 NOTE — Evaluation (Signed)
Physical Therapy Evaluation Patient Details Name: Whitney [name] MRN: [id number] DOB: 08-05-1935 Today's Date: 08/21/2020   History of Present Illness  Whitney [name] is 85 year old female with a past medical history of insulin-dependent diabetes, CKD stage III, and chronic right hip fracture that presented to [facility] with 3 week history of nausea and vomiting complicated by the development of abdominal pain, dysuria found to have septic shock secondary to emphysematous cystitis and left-sided pyelitis.  Clinical Impression  Patient presents with decreased mobility due to pain in abdomen/pelvis.  She previously lived at home and was evidently able to assist with in bed positioning and rolling.  Today she was unable to assist due to severe abdominal and pelvic pain.  She has chronically dislocated R hip and significant contracture, L shoulder issues as well per pt.  Feel she may benefit from skilled PT while in the acute setting to allow for improved mobility in the bed to hopefully return home with family assist.  If family unable to provide assist would need SNF placement.     Follow Up Recommendations Supervision/Assistance - 24 hour;Home health PT;SNF    Equipment Recommendations  Other (comment) (if home may need lift if she doesn't already have one)    Recommendations for Other Services       Precautions / Restrictions Precautions Precautions: Fall Precaution Comments: chronic R Hip dislocation      Mobility  Bed Mobility               General bed mobility comments: declines any in bed mobility due to pain in bladder area, RN aware  Transfers                    Ambulation/Gait                Stairs            Wheelchair Mobility    Modified Rankin (Stroke Patients Only)       Balance                                             Pertinent Vitals/Pain Faces Pain Scale: Hurts worst Pain Location: abdomen/bladder Pain Descriptors /  Indicators: Grimacing;Guarding;Discomfort;Moaning Pain Intervention(s): Monitored during session;Premedicated before session;Patient requesting pain meds-RN notified;Limited activity within patient's tolerance    Home Living Family/patient expects to be discharged to:: Private residence Living Arrangements: Children Available Help at Discharge: Family Type of Home: Apartment                Prior Function Level of Independence: Needs assistance   Gait / Transfers Assistance Needed: patient reports bedbound, has not sat up at EOB in about a year, but per previous records pt able to roll in bed with rails with assist  ADL's / Homemaking Assistance Needed: per previous PT note 7/21: "requires assistance for all ADLs, not totalA, able to utilize BUE to assist with feeding, bathing, dressing, etc. Requires at least setup for all ADLs"        Hand Dominance   Dominant Hand: Right    Extremity/Trunk Assessment   Upper Extremity Assessment Upper Extremity Assessment: RUE deficits/detail;LUE deficits/detail RUE Deficits / Details: AROM WFL, strength at least 3/5 shoulder flexion elbow flex 4-/5 LUE Deficits / Details: reports "broke" L shoulder, elbow AROM WFL, shoulder AAROM to about 80 with pain does not lift antigravity, elbow  flexion strength about 3+/5    Lower Extremity Assessment Lower Extremity Assessment: LLE deficits/detail;RLE deficits/detail RLE Deficits / Details: hip internally rotated and flexed with knee on pillow; declined to move and unable to tolerate me moving, pt able to move ankle and reports sensation WFL LLE Deficits / Details: noted edema in LE's AAROM limited by pain at hip and knee only able to to move slightly, pt moves ankle [facility]       Communication   Communication: Prefers language other than English (Arabic)  Cognition Arousal/Alertness: Awake/alert Behavior During Therapy: Anxious Overall Cognitive Status: No family/caregiver present to determine  baseline cognitive functioning                                 General Comments: patient focused on pain and at times unable to answer questions through interpreter, RN made aware as she needs to contact MD      General Comments      Exercises     Assessment/Plan    PT Assessment Patient needs continued PT services  PT Problem List Decreased mobility;Decreased strength;Decreased range of motion;Decreased activity tolerance       PT Treatment Interventions Therapeutic activities;Therapeutic exercise;Patient/family education;Functional mobility training    PT Goals (Current goals can be found in the Care Plan section)  Acute Rehab PT Goals Patient Stated Goal: to help pain PT Goal Formulation: With patient Time For Goal Achievement: 09/04/20 Potential to Achieve Goals: Fair    Frequency Min 3X/week   Barriers to discharge        Co-evaluation               AM-PAC PT "6 Clicks" Mobility  Outcome Measure Help needed turning from your back to your side while in a flat bed without using bedrails?: Total Help needed moving from lying on your back to sitting on the side of a flat bed without using bedrails?: Total Help needed moving to and from a bed to a chair (including a wheelchair)?: Total Help needed standing up from a chair using your arms (e.g., wheelchair or bedside chair)?: Total Help needed to walk in hospital room?: Total Help needed climbing 3-5 steps with a railing? : Total 6 Click Score: 6    End of Session   Activity Tolerance: Patient limited by pain Patient left: in bed   PT Visit Diagnosis: Other abnormalities of gait and mobility (R26.89);Pain Pain - part of body:  (lower abdomen)    Time: 1937-9024 PT Time Calculation (min) (ACUTE ONLY): 18 min   Charges:   PT Evaluation $PT Eval Moderate Complexity: 1 Mod          [name], PT Acute Rehabilitation  Services OXBDZ:[phone] Office:[phone] 08/21/2020   [name] 08/21/2020, 5:41 PM

## 2020-08-21 NOTE — Progress Notes (Signed)
Inpatient Diabetes Program Recommendations  AACE/ADA: New Consensus Statement on Inpatient Glycemic Control (2015)  Target Ranges:  Prepandial:   less than 140 mg/dL      Peak postprandial:   less than 180 mg/dL (1-2 hours)      Critically ill patients:  140 - 180 mg/dL   Lab Results  Component Value Date   GLUCAP 111 (H) 08/21/2020   HGBA1C 7.8 (H) 08/18/2020    Review of Glycemic Control Results for Whitney [name], Whitney [name] (MRN [id number]) as of 08/21/2020 12:06  Ref. Range 08/20/2020 19:52 08/21/2020 08:05 08/21/2020 11:38  Glucose-Capillary Latest Ref Range: 70 - 99 mg/dL 116 (H) 101 (H) 111 (H)   Diabetes history: Type 2 DM Outpatient Diabetes medications: Lantus 6 units QHS Current orders for Inpatient glycemic control: Lantus 5 units QHS, Novolog 3 units TID, Novolog 0-15 units TID, Novolog 0-5 units QHS Decadron 10 mg x 1 on 10/8  Inpatient Diabetes Program Recommendations:    Given prior Decadron admin on 10/8, age, inconsistent meal intake and renal status, may want to consider discontinuing Novolog 3 units TID.   Thanks, Bronson Curb, MSN, RNC-OB Diabetes Coordinator [phone] (8a-5p)

## 2020-08-21 NOTE — Progress Notes (Signed)
Nutrition Follow-up  DOCUMENTATION CODES:   Not applicable  INTERVENTION:  Provide Glucerna Shake po TID, each supplement provides 220 kcal and 10 grams of protein  Provide 30 ml Prosource plus po TID, each supplement provides 100 kcal and 15 grams of protein.   Encourage adequate PO intake.   NUTRITION DIAGNOSIS:   Increased nutrient needs related to wound healing as evidenced by estimated needs; ongoing  GOAL:   Patient will meet greater than or equal to 90% of their needs; progressing  MONITOR:   PO intake, Supplement acceptance, Skin, Weight trends, Labs, I & O's  REASON FOR ASSESSMENT:   Consult Assessment of nutrition requirement/status, Wound healing  ASSESSMENT:   85 year old female with past medical history of type 2 diabetes, CKD stage IIIb and chronic right hip fracture who presented to the ED with multiple episodes of nausea and vomiting over the last couple weeks. Pt with septic shock secondary to emphysematous pyelitis/cystitis and gram-negative bacteremia  Diet has been advanced to a dysphagia 1 diet with thin liquids. Meal completion has been varied from 0-95%. Pt refused breakfast this AM, however did consume 95% of her lunch today. RD contact via MD regarding need for high protein/nutritional supplementation to aid in nutrition as well as in wound healing.  Labs and medications reviewed.   Diet Order:   Diet Order            DIET - DYS 1 Room service appropriate? Yes with Assist; Fluid consistency: Thin  Diet effective now                 EDUCATION NEEDS:   Not appropriate for education at this time  Skin:  Skin Assessment: Skin Integrity Issues: Skin Integrity Issues:: Stage II Stage II: R leg  Last BM:  10/7  Height:   Ht Readings from Last 1 Encounters:  08/18/20 5\' 3"  (1.6 m)    Weight:   Wt Readings from Last 1 Encounters:  08/21/20 85 kg   BMI:  Body mass index is 33.19 kg/m.  Estimated Nutritional Needs:   Kcal:   1600-1800  Protein:  80-90 grams  Fluid:  >/= 1.6 L/day  Corrin Parker, MS, RD, LDN RD pager number/after hours weekend pager number on Amion.

## 2020-08-21 NOTE — Progress Notes (Signed)
3 Days Post-Op Subjective: Abdominal pain better controlled.  No nausea or emesis.  Tolerating Foley.  Afebrile.  Objective: Vital signs in last 24 hours: Temp:  [97.5 F (36.4 C)-97.7 F (36.5 C)] 97.6 F (36.4 C) (10/11 0049) Pulse Rate:  [60-92] 92 (10/11 0049) Resp:  [16-22] 16 (10/11 0049) BP: (123-147)/(54-113) 123/69 (10/11 0049) SpO2:  [93 %-100 %] 94 % (10/11 0049)  Intake/Output from previous day: 10/10 0701 - 10/11 0700 In: 2303.6 [P.O.:870; I.V.:1233.6; IV Piggyback:200] Out: 1150 [Urine:1150] Intake/Output this shift: Total I/O In: 1204.4 [P.O.:150; I.V.:954.4; IV Piggyback:100] Out: 600 [Urine:600]  Physical Exam:  General: Alert and oriented CV: RRR Lungs: Clear Abdomen: Soft, ND, nontender; no CVA tenderness Ext: NT, No erythema  Lab Results: Recent Labs    08/19/20 2036 08/20/20 0350  HGB 9.0* 8.2*  HCT 28.4* 25.5*   BMET Recent Labs    08/19/20 1330 08/20/20 0350  NA 138 138  K 4.8 4.4  CL 109 111  CO2 17* 21*  GLUCOSE 259* 302*  BUN 48* 51*  CREATININE 2.11* 1.94*  CALCIUM 7.8* 7.8*     Studies/Results: CT ABDOMEN PELVIS WO CONTRAST  Result Date: 08/20/2020 CLINICAL DATA:  Complicated pyelonephritis. Status post cystoscopy with retrograde pyelogram and ureteral stent placement. EXAM: CT ABDOMEN AND PELVIS WITHOUT CONTRAST TECHNIQUE: Multidetector CT imaging of the abdomen and pelvis was performed following the standard protocol without IV contrast. COMPARISON:  CT of the abdomen pelvis without contrast 08/17/2020 FINDINGS: Lower chest: Heart size upper limits of normal. Coronary artery calcifications are present. Left greater than right pleural effusions are present. Associated airspace disease also worse on the left. Other patchy peripheral airspace opacities are seen bilaterally. Airways are patent. Hepatobiliary: Gallstones are again noted. Gallbladder is somewhat contracted compared to the prior study. Liver is unremarkable. The common  bile duct is within normal limits. Pancreas: Pancreas is markedly atrophic. No focal lesions are present. Spleen: Normal in size without focal abnormality. Adrenals/Urinary Tract: Adrenal glands are normal bilaterally. Right kidney in ureter are unremarkable. Left double-J ureteral stent is now in place. Left renal collecting system is decompressed. No gas is present in the collecting system. Foley catheter is present in the urinary bladder. Stomach/Bowel: The stomach is mildly distended. Duodenum is unremarkable. No mechanical obstruction is present. Small bowel is unremarkable. Terminal ileum is within normal limits. The ascending and transverse colon are within normal limits. Descending and sigmoid colon are normal. Vascular/Lymphatic: IVC filter is in place. Minimal atherosclerotic changes are present without aneurysm. No significant adenopathy is present. Reproductive: Calcified fibroid is noted. Other: No significant free fluid or free air is present. Diffuse edema is present in the mesentery. Left lower quadrant ventral hernia contains fat without bowel. Diffuse subcutaneous edema is present. Musculoskeletal: Subcutaneous calcified mass lesion posterior to the left iliac likely represent a calcified sebaceous cyst. No other focal lesions are present. Remote endplate fractures at L1 and L3 are again noted. Straightening of the normal cervical lordosis present. Fused anterior osteophytes are present in the lower thoracic spine. Bony pelvis is normal. Left total hip arthroplasty is present. Methylmethacrylate along the medial aspect of the acetabulum is stable. The right femoral head is absent. Chronic dislocation is noted. IMPRESSION: 1. Left double-J ureteral stent is now in place. Left renal collecting system is decompressed. No gas is present in the collecting system. 2. Foley catheter in the urinary bladder. 3. Left greater than right pleural effusions with associated airspace disease, worse on the left.  This could  represent atelectasis or pneumonia. 4. Other patchy peripheral airspace opacities are seen bilaterally. This could represent atelectasis or pneumonia. 5. Cholelithiasis without evidence for cholecystitis. 6. Diffuse subcutaneous edema compatible with anasarca. 7. Left lower quadrant ventral hernia contains fat without bowel. 8. Left total hip arthroplasty with chronic dislocation of the right hip. 9. Remote endplate fractures at L1 and L3. 10. Aortic Atherosclerosis (ICD10-I70.0). Electronically Signed   By: San Morelle M.D.   On: 08/20/2020 12:44    Assessment/Plan: 1. Emphysematous cystitis and emphysematous left pyelitis:Seen on CT A/P 08/17/2020. Urine cx multiple species present 10/7.L RP urine cx: few klebsiella 10/8. Blood cx 10/8: Klebsiella. S/p cysto/L stent/foley 08/18/2020.  CT A/P 08/20/2020 with decompressed left renal collecting system with no gas in the collecting system and bladder decompressed around a Foley catheter with no further gas within the bladder. 2. Sepsis secondary to emphysematous cystitis and left pyelitis. Febrile with leukocytosis and lactic acidosis upon presentation. Improving with broad-spectrum antibioticsand GU decompression. 3. AKI on CKD 3:Creatinine 2.16 on 10/7 from baseline of 1.3. Improving. 4. History of type 2 diabetes.Present with hyperglycemia.431 upon initial presentation.  a. Foley to gravity b. Continue merrem. Urine and blood culture with Klebsiella pneumoniae, sensitive to imipenem and ciprofloxacin. c. Persistent AKI despite stent; however, cr downtrending d. WBC downtrending e. As no further gas present in the collecting system, holding off on left percutaneous nephrostomy tube. f. CT A/P also with bilateral pleural effusions that could represent atelectasis versus pneumonia. g. Following   LOS: 4 days   [name] 08/21/2020, 5:18 AM [facility]  Pager: [phone]

## 2020-08-21 NOTE — Anesthesia Postprocedure Evaluation (Signed)
Anesthesia Post Note  Patient: Whitney [name]  Procedure(s) Performed: CYSTOSCOPY WITH RETROGRADE PYELOGRAM/URETERAL STENT PLACEMENT (Left )     Patient location during evaluation: PACU Anesthesia Type: General Level of consciousness: awake and alert Pain management: pain level controlled Vital Signs Assessment: post-procedure vital signs reviewed and stable Respiratory status: spontaneous breathing, nonlabored ventilation, respiratory function stable and patient connected to nasal cannula oxygen Cardiovascular status: blood pressure returned to baseline and stable Postop Assessment: no apparent nausea or vomiting Anesthetic complications: no   No complications documented.  Last Vitals:  Vitals:   08/20/20 1715 08/20/20 2034  BP: 126/72 (!) 127/54  Pulse: 83 60  Resp: (!) 22 16  Temp: 36.5 C (!) 36.4 C  SpO2:  100%    Last Pain:  Vitals:   08/20/20 2034  TempSrc: Oral  PainSc:                  Nadiyah Zeis COKER

## 2020-08-21 NOTE — Progress Notes (Signed)
Subjective:  Whitney [name] is 85 year old female with a past medical history of insulin-dependent diabetes, CKD stage III, and chronic right hip fracture that presented to [facility] with3 week history ofnausea and vomiting complicated by the development of abdominal pain, dysuria found to have septic shock secondary to emphysematous cystitis and left-sided pyelitis.  Overnight, no acute events.  This morning, she reports continued abdominal pain, improved from admission, throughout the suprapubic region and left side of her abdomen. She denies nausea, vomiting, diarrhea. She states that she would like medication for the pain as she had not received any at this point.  Objective:  Vital signs in last 24 hours: Vitals:   08/20/20 1254 08/20/20 1715 08/20/20 2034 08/21/20 0049  BP: (!) 147/73 126/72 (!) 127/54 123/69  Pulse: 85 83 60 92  Resp: 20 (!) 22 16 16   Temp: (!) 97.5 F (36.4 C) 97.7 F (36.5 C) (!) 97.5 F (36.4 C) 97.6 F (36.4 C)  TempSrc: Oral  Oral Oral  SpO2: 93%  100% 94%  Weight:      Height:      SpO2: 96 % O2 Flow Rate (L/min): 2 L/min  Weight change:   Intake/Output Summary (Last 24 hours) at 08/21/2020 0602 Last data filed at 08/21/2020 0300 Gross per 24 hour  Intake 2303.56 ml  Output 1150 ml  Net 1153.56 ml   General: Elderly female laying in bed, smiling , NAD,  Cardiovascular: Normal rate, regular rhythm.  No murmurs, rubs, or gallops Pulmonary: breath sounds normal. No wheezes, rales, or rhonchi Abdominal: bowel sounds present, soft, nondistended, umbilical hernia present, tender to palpation in suprapubic region and left abdomen.  CBC Latest Ref Rng & Units 08/21/2020 08/20/2020 08/19/2020  WBC 4.0 - 10.5 K/uL 16.9(H) 18.9(H) 20.0(H)  Hemoglobin 12.0 - 15.0 g/dL 10.4(L) 8.2(L) 9.0(L)  Hematocrit 36 - 46 % 31.8(L) 25.5(L) 28.4(L)  Platelets 150 - 400 K/uL 124(L) 178 182   CMP Latest Ref Rng & Units 08/21/2020 08/20/2020 08/19/2020  Glucose 70 - 99  mg/dL 109(H) 302(H) 259(H)  BUN 8 - 23 mg/dL 47(H) 51(H) 48(H)  Creatinine 0.44 - 1.00 mg/dL 1.75(H) 1.94(H) 2.11(H)  Sodium 135 - 145 mmol/L 139 138 138  Potassium 3.5 - 5.1 mmol/L 4.4 4.4 4.8  Chloride 98 - 111 mmol/L 111 111 109  CO2 22 - 32 mmol/L 21(L) 21(L) 17(L)  Calcium 8.9 - 10.3 mg/dL 8.0(L) 7.8(L) 7.8(L)  Total Protein 6.5 - 8.1 g/dL 5.8(L) 5.1(L) 5.3(L)  Total Bilirubin 0.3 - 1.2 mg/dL 1.0 0.7 1.2  Alkaline Phos 38 - 126 U/L 180(H) 120 126  AST 15 - 41 U/L 37 20 21  ALT 0 - 44 U/L 14 12 12    Assessment/Plan:  Active Problems:   Sepsis (HCC)   Pyelonephritis   Pressure injury of skin  Whitney [name] is 85 year old female with a past medical history of insulin-dependent diabetes, CKD stage III, and chronic right hip fracture that presented to [facility] with 3 week history of nausea and vomiting complicated by the development of abdominal pain, dysuria found to have septic shock secondary to emphysematous cystitis and left-sided pyelitis.  #Emphysematous cystitis and left emphysematous pyelitis, active #ESBL-producing Klebsiella bacteremia, active Patient presented with septic shock 2/2 ESBL-producing Klebsiella bacteremia. Found to have emphysematous cystitis and left-sided emphysematous pylelitis. Patient is s/p Left Ureteral Stent placement and Foley Catheter drainage. CT abdomen and pelvis wo contrast completed which revealed decompression of left renal collecting system and no further gas. Dr.Gay, urologist, following closely and  has recommended keeping Foley to gravity and continuing antibiotics. No need for PCN at this point.  -Urology following, greatly appreciate recommendations. -Continue meropenem -Tylenol for moderate pain -Dilaudid for severe pain -Monitor I/Os -Continue meropenem -PT/OT eval and treat  #AKI on CKD Stage III, active Patient with a history of CKDIII presented with Creatinine of 2.16, 1.3 at baseline. Most likely a component of prerenal and postrenal.  Stent placed for obstruction and patient improving with IV fluids. Will continue to give IV fluids and trend Cr, however due to patient's hypoalbuminemia, likely intravascularly depleted. She would benefit from RD consultation for protein supplementation. -CMP -RD consult  #T2DM, chronic A1c 7.8. Patient presented with hyperglycemia, BG 431. Her daughter helps administer basal insulin at home. We are administering Lantus 3 units with SSI. Blood sugar above goal today. Patient has resumed po intake. Will continue to trend and make adjustments -CBG monitoring -Basal : Increase Lantus to 5 units QHS -3 units aspart w/ meals , SSI -M, Correction scale QHS  Code status: Full Code VTE ppx: Heparin IVF: none Diet: Dysphagia 1 Bowel Regimen: Senna-docusate twice daily  Cato Mulligan, MD 08/21/2020, 6:01 AM Pager: [phone] After 5pm on weekdays and 1pm on weekends: On Call pager [phone]

## 2020-08-21 NOTE — Progress Notes (Signed)
  Speech Language Pathology Treatment: Dysphagia  Patient Details Name: Whitney [name] MRN: [id number] DOB: 1935/01/03 Today's Date: 08/21/2020 Time: 5498-2641 SLP Time Calculation (min) (ACUTE ONLY): 26 min  Assessment / Plan / Recommendation Clinical Impression  Pt was seen during lunch meal with assistance from [place] interpreter Mordecai Rasmussen [phone]). Before starting her meal, pt stated that she could only eat it if it was "blended." She says that this is all that she eats at home. Despite best efforts at repositioning, she is still partially reclined for PO intake due to pain with elevating HOB. Education was offered regarding safe postitioning for eating and drinking, but to this pt also replied that she has been eating "like this (reclined) for 12 years." She did cough on her initial sip of water, but no further s/s of aspiration were observed across the rest of her meal. Given that this appears to be her baseline level of function per her own report, no further SLP f/u indicated at this time. Would continue to encourage more optimal positioning during meals as much as possible.   HPI HPI: Pt is an 85 yo female presenting with several weeks of N/V and abdominal pain, admitted with possible sepsis. CT abdomen suggestive of cystitis and L-sided pyelitis. PMH: DM, CKD stage III, chronic R hip fx (bedbound at baseline)      SLP Plan  All goals met       Recommendations  Diet recommendations: Dysphagia 1 (puree);Thin liquid Liquids provided via: Straw;Cup Medication Administration: Crushed with puree Supervision: Staff to assist with self feeding;Full supervision/cueing for compensatory strategies Compensations: Minimize environmental distractions;Slow rate;Small sips/bites Postural Changes and/or Swallow Maneuvers: Upright 30-60 min after meal (as upright as possible)                Oral Care Recommendations: Oral care BID Follow up Recommendations: 24 hour supervision/assistance;Skilled  Nursing facility SLP Visit Diagnosis: Dysphagia, unspecified (R13.10) Plan: All goals met       GO                Whitney [name]., M.A. [place] [facility] Pager [phone] Office [phone]  08/21/2020, 1:28 PM

## 2020-08-21 NOTE — Progress Notes (Signed)
Assessed current PIV, flushed with no signs of infiltration. Bruising and discoloration seems to be from BP cuff. Left arm has a restricted arm band but unsure why. RN to further investigate.

## 2020-08-22 ENCOUNTER — Inpatient Hospital Stay (HOSPITAL_COMMUNITY): Payer: 59

## 2020-08-22 DIAGNOSIS — N3001 Acute cystitis with hematuria: Secondary | ICD-10-CM

## 2020-08-22 DIAGNOSIS — B961 Klebsiella pneumoniae [K. pneumoniae] as the cause of diseases classified elsewhere: Secondary | ICD-10-CM

## 2020-08-22 DIAGNOSIS — A419 Sepsis, unspecified organism: Secondary | ICD-10-CM

## 2020-08-22 DIAGNOSIS — R140 Abdominal distension (gaseous): Secondary | ICD-10-CM

## 2020-08-22 DIAGNOSIS — N12 Tubulo-interstitial nephritis, not specified as acute or chronic: Secondary | ICD-10-CM | POA: Diagnosis not present

## 2020-08-22 DIAGNOSIS — M25559 Pain in unspecified hip: Secondary | ICD-10-CM

## 2020-08-22 DIAGNOSIS — R7881 Bacteremia: Secondary | ICD-10-CM | POA: Diagnosis not present

## 2020-08-22 DIAGNOSIS — D72829 Elevated white blood cell count, unspecified: Secondary | ICD-10-CM

## 2020-08-22 LAB — COMPREHENSIVE METABOLIC PANEL
ALT: 14 U/L (ref 0–44)
AST: 27 U/L (ref 15–41)
Albumin: 1.5 g/dL — ABNORMAL LOW (ref 3.5–5.0)
Alkaline Phosphatase: 170 U/L — ABNORMAL HIGH (ref 38–126)
Anion gap: 8 (ref 5–15)
BUN: 42 mg/dL — ABNORMAL HIGH (ref 8–23)
CO2: 21 mmol/L — ABNORMAL LOW (ref 22–32)
Calcium: 7.9 mg/dL — ABNORMAL LOW (ref 8.9–10.3)
Chloride: 111 mmol/L (ref 98–111)
Creatinine, Ser: 1.69 mg/dL — ABNORMAL HIGH (ref 0.44–1.00)
GFR, Estimated: 28 mL/min — ABNORMAL LOW (ref >60)
Glucose, Bld: 114 mg/dL — ABNORMAL HIGH (ref 70–99)
Potassium: 4.7 mmol/L (ref 3.5–5.1)
Sodium: 140 mmol/L (ref 135–145)
Total Bilirubin: 0.8 mg/dL (ref 0.3–1.2)
Total Protein: 5.8 g/dL — ABNORMAL LOW (ref 6.5–8.1)

## 2020-08-22 LAB — CBC WITH DIFFERENTIAL/PLATELET
Abs Immature Granulocytes: 0.14 10*3/uL — ABNORMAL HIGH (ref 0.00–0.07)
Basophils Absolute: 0 10*3/uL (ref 0.0–0.1)
Basophils Relative: 0 %
Eosinophils Absolute: 0.1 10*3/uL (ref 0.0–0.5)
Eosinophils Relative: 1 %
HCT: 31.4 % — ABNORMAL LOW (ref 36.0–46.0)
Hemoglobin: 9.8 g/dL — ABNORMAL LOW (ref 12.0–15.0)
Immature Granulocytes: 1 %
Lymphocytes Relative: 12 %
Lymphs Abs: 1.7 10*3/uL (ref 0.7–4.0)
MCH: 29 pg (ref 26.0–34.0)
MCHC: 31.2 g/dL (ref 30.0–36.0)
MCV: 92.9 fL (ref 80.0–100.0)
Monocytes Absolute: 1.4 10*3/uL — ABNORMAL HIGH (ref 0.1–1.0)
Monocytes Relative: 10 %
Neutro Abs: 10.4 10*3/uL — ABNORMAL HIGH (ref 1.7–7.7)
Neutrophils Relative %: 76 %
Platelets: 153 10*3/uL (ref 150–400)
RBC: 3.38 MIL/uL — ABNORMAL LOW (ref 3.87–5.11)
RDW: 16.1 % — ABNORMAL HIGH (ref 11.5–15.5)
WBC: 13.8 10*3/uL — ABNORMAL HIGH (ref 4.0–10.5)
nRBC: 0.1 % (ref 0.0–0.2)

## 2020-08-22 LAB — GLUCOSE, CAPILLARY
Glucose-Capillary: 123 mg/dL — ABNORMAL HIGH (ref 70–99)
Glucose-Capillary: 113 mg/dL — ABNORMAL HIGH (ref 70–99)
Glucose-Capillary: 111 mg/dL — ABNORMAL HIGH (ref 70–99)
Glucose-Capillary: 125 mg/dL — ABNORMAL HIGH (ref 70–99)
Glucose-Capillary: 121 mg/dL — ABNORMAL HIGH (ref 70–99)

## 2020-08-22 NOTE — Progress Notes (Signed)
PHARMACY CONSULT NOTE FOR: Invanz (ertapenem)  OUTPATIENT  PARENTERAL ANTIBIOTIC THERAPY (OPAT)  Indication: ESBL producing Klebsiella Pneumoniae bacteremia Regimen: Ertapenem 500 mg IV q 24 hrs End date: 08/30/2020  IV antibiotic discharge orders are pended. To discharging provider:  please sign these orders via discharge navigator,  Select New Orders & click on the button choice - Manage This Unsigned Work.     Thank you for allowing pharmacy to be a part of this patient's care.  [place] Crane, Roylene Reason, BCCP Clinical Pharmacist  08/22/2020 6:54 PM   [facility] pharmacy phone numbers are listed on [email or web address]

## 2020-08-22 NOTE — Plan of Care (Signed)
  Problem: Clinical Measurements: Goal: Signs and symptoms of infection will decrease 08/22/2020 2336 by Mena Goes, RN Outcome: Progressing 08/22/2020 2336 by Mena Goes, RN Outcome: Progressing   Problem: Respiratory: Goal: Ability to maintain adequate ventilation will improve 08/22/2020 2336 by Mena Goes, RN Outcome: Progressing 08/22/2020 2336 by Mena Goes, RN Outcome: Progressing   Problem: Education: Goal: Knowledge of General Education information will improve Description: Including pain rating scale, medication(s)/side effects and non-pharmacologic comfort measures 08/22/2020 2336 by Mena Goes, RN Outcome: Progressing 08/22/2020 2336 by Mena Goes, RN Outcome: Progressing   Problem: Pain Managment: Goal: General experience of comfort will improve 08/22/2020 2336 by Mena Goes, RN Outcome: Progressing 08/22/2020 2336 by Mena Goes, RN Outcome: Progressing   Problem: Safety: Goal: Ability to remain free from injury will improve 08/22/2020 2336 by Julaine Hua B, RN Outcome: Progressing 08/22/2020 2336 by Mena Goes, RN Outcome: Progressing   Problem: Skin Integrity: Goal: Risk for impaired skin integrity will decrease 08/22/2020 2336 by Mena Goes, RN Outcome: Progressing 08/22/2020 2336 by Mena Goes, RN Outcome: Progressing

## 2020-08-22 NOTE — Progress Notes (Signed)
4 Days Post-Op Subjective: Abdominal pain controlled. No nausea or emesis. Tolerating foley. Remains afebrile.   Objective: Vital signs in last 24 hours: Temp:  [97.5 F (36.4 C)-97.9 F (36.6 C)] 97.9 F (36.6 C) (10/12 0737) Pulse Rate:  [60-96] 92 (10/12 0737) Resp:  [13-14] 13 (10/12 0737) BP: (98-125)/(38-70) 104/53 (10/12 0737) SpO2:  [91 %-96 %] 96 % (10/12 0737) Weight:  [88 kg] 88 kg (10/12 0500)  Intake/Output from previous day: 10/11 0701 - 10/12 0700 In: 2202.1 [P.O.:618; I.V.:1083; IV Piggyback:501.1] Out: 540 [Urine:540] Intake/Output this shift: Total I/O In: 118 [P.O.:118] Out: -    UOP: 540ml yellow  Physical Exam:  General: Alert and oriented CV: RRR Lungs: Clear Abdomen: Soft, ND, NT Ext: NT, No erythema  Lab Results: Recent Labs    08/20/20 0350 08/21/20 0446 08/22/20 0348  HGB 8.2* 10.4* 9.8*  HCT 25.5* 31.8* 31.4*   BMET Recent Labs    08/21/20 0446 08/22/20 0348  NA 139 140  K 4.4 4.7  CL 111 111  CO2 21* 21*  GLUCOSE 109* 114*  BUN 47* 42*  CREATININE 1.75* 1.69*  CALCIUM 8.0* 7.9*     Studies/Results: No results found.  Assessment/Plan: 1. Emphysematous cystitis and emphysematous left pyelitis:Seen on CT A/P 08/17/2020. Urine cxmultiple species present 10/7.L RP urine cx: few klebsiella 10/8. Blood cx 10/8: Klebsiella.S/p cysto/L stent/foley 08/18/2020.  CT A/P 08/20/2020 with decompressed left renal collecting system with no gas in the collecting system and bladder decompressed around a Foley catheter with no further gas within the bladder. 2. Sepsis secondary to emphysematous cystitis and left pyelitis. Febrile with leukocytosis and lactic acidosis upon presentation. Improving with broad-spectrum antibioticsand GU decompression. 3. AKI on CKD 3:Creatinine 2.16 on 10/7 from baseline of 1.3.Improving. 4. History of type 2 diabetes.Present with hyperglycemia.431 upon initial presentation.  a. Foley to  gravity b. Continuemerrem. Urine and blood culture with Klebsiella pneumoniae, sensitive to imipenem and ciprofloxacin. Would recommend prolong course of IV abx given seriousness of infection. c. Persistent AKI despite stent; however, cr downtrending d. WBC downtrending e. As no further gas present in the collecting system, holding off on left percutaneous nephrostomy tube. f. CT A/P also with bilateral pleural effusions that could represent atelectasis versus pneumonia. g. Following peripherally   LOS: 5 days   Whitney [name] 08/22/2020, 10:32 AM [facility]  Pager: [phone]

## 2020-08-22 NOTE — Plan of Care (Signed)

## 2020-08-22 NOTE — Consult Note (Addendum)
[facility]    Date of Admission:  08/17/2020      Total days of antibiotics 6  Meropenem day 6               Reason for Consult: ESBL producing Klebsiella Emphysematous GU infection    Referring Provider: Dareen Piano  Primary Care Provider: Patient, No Pcp Per    Assessment: Whitney [name] is a 85 y.o. female admitted with 3 week history of nausea/diarrhea that progressed to significant abdominal pain and weakness. She was found to have emphysematous pyelonephritis / cystitis with secondary bacteremia due to ESBL producing Klebsiella Pneumoniae. Fortunately she has improved significantly with Meropenem and stent placement with adequate decompression avoiding nephrostomy tube placement.  While she would typically be at a point where one could consider transition to oral therapy, I am leery putting an 85 year old on a course of ciprofloxacin due to high risk for cdiff infection. I think the safer plan would be to continue IV therapy with once daily ertapenem to complete 14 total days of treatment (through 10/20).   Unclear as to restrictions on left arm - hx of broken arm per the patient? She will need tunneled access anyway given low CrCl.   IV infiltration noted on the left PIV - she feels "full of fluid". Seems to be eating and drinking OK. Hopefully can D/C maintenance IVF - will defer to Dr. Wilber Bihari team.     Plan: 1. Plan for home Ertapenem through 10/20  2. She will need tunneled PICC placement (CrCl 26 mL/min)  3. ? D/C IVF given taking POs.   Will follow up in in clinic again to see how she is doing before pulling PICC out. Appt for 10/21 with me at 10:30 am    Principal Problem:   Emphysematous pyelonephritis of left kidney Active Problems:   Bacteremia due to Klebsiella pneumoniae   Sepsis ([place])   Pressure injury of skin   . (feeding supplement) PROSource Plus  30 mL Oral TID BM  . Chlorhexidine Gluconate Cloth  6 each Topical  Daily  . feeding supplement (GLUCERNA SHAKE)  237 mL Oral TID BM  . heparin injection (subcutaneous)  5,000 Units Subcutaneous Q8H  . insulin aspart  0-15 Units Subcutaneous TID WC  . insulin aspart  0-5 Units Subcutaneous QHS  . insulin glargine  5 Units Subcutaneous QHS  . senna-docusate  1 tablet Oral BID    HPI: Whitney [name] is a 85 y.o. female admitted to the hospital 10/07 for weakness, nausea, vomiting and abdominal pain from home.   She is bed-bound per chart records and lives with family. She had a GI illness with vomiting/diarrhea x 3 weeks prior to current illness. She does have a history of urinary retention with catheter use occasionally.   In ER she was found to have hypotension, leukocytosis to 21.7K, AKI with SCr > 2 and UA suggestive of infection. She was started on IV vancomycin + cefepime + metronidazole. CT A/P revealed significant emphysematous cystitis and pyelonephritis (L). Urology was consulted 10/08 and she was taken to OR for cystogram, L RPG with stent and foley catheter placement. She was found to have secondary bacteremia with ESBL producing klebsiella and placed on meropenem. She     Review of Systems: Review of Systems  Constitutional: Negative for chills and fever.  Respiratory: Negative for cough.   Cardiovascular: Positive for leg swelling. Negative for chest pain.  Gastrointestinal:  Negative for abdominal pain, nausea and vomiting.  Genitourinary: Negative for dysuria and flank pain.  Musculoskeletal: Negative for back pain.  Skin: Negative for rash.    Past Medical History:  Diagnosis Date  . Diabetes mellitus without complication ([place])     Social History   Tobacco Use  . Smoking status: Never Smoker  . Smokeless tobacco: Never Used  Substance Use Topics  . Alcohol use: Never  . Drug use: Never    History reviewed. No pertinent family history. No Known Allergies  OBJECTIVE: Blood pressure (!) 141/64, pulse 98, temperature 97.6 F (36.4  C), temperature source Axillary, resp. rate 12, height 5\' 3"  (1.6 m), weight 88 kg, SpO2 97 %.  Physical Exam Constitutional:      Comments: Resting in bed comfortably.   HENT:     Mouth/Throat:     Mouth: Mucous membranes are moist.  Pulmonary:     Effort: Pulmonary effort is normal.  Abdominal:     General: Bowel sounds are normal. There is no distension.     Palpations: Abdomen is soft.     Tenderness: There is no abdominal tenderness.  Musculoskeletal:     Comments: RUE swollen with ecchymosis with PIV in place that is leaking.   Skin:    General: Skin is warm and dry.     Capillary Refill: Capillary refill takes less than 2 seconds.  Neurological:     Mental Status: She is alert and oriented to person, place, and time.     Lab Results Lab Results  Component Value Date   WBC 13.8 (H) 08/22/2020   HGB 9.8 (L) 08/22/2020   HCT 31.4 (L) 08/22/2020   MCV 92.9 08/22/2020   PLT 153 08/22/2020    Lab Results  Component Value Date   CREATININE 1.69 (H) 08/22/2020   BUN 42 (H) 08/22/2020   NA 140 08/22/2020   K 4.7 08/22/2020   CL 111 08/22/2020   CO2 21 (L) 08/22/2020    Lab Results  Component Value Date   ALT 14 08/22/2020   AST 27 08/22/2020   ALKPHOS 170 (H) 08/22/2020   BILITOT 0.8 08/22/2020     Microbiology: Recent Results (from the past 240 hour(s))  Blood culture (routine x 2)     Status: Abnormal   Collection Time: 08/17/20  8:51 AM   Specimen: BLOOD  Result Value Ref Range Status   Specimen Description BLOOD LEFT ANTECUBITAL  Final   Special Requests   Final    BOTTLES DRAWN AEROBIC AND ANAEROBIC Blood Culture results may not be optimal due to an inadequate volume of blood received in culture bottles   Culture  Setup Time   Final    GRAM NEGATIVE RODS IN BOTH AEROBIC AND ANAEROBIC BOTTLES CRITICAL RESULT CALLED TO, READ BACK BY AND VERIFIED WITH: G. ABBOTT,PHARMD 0222 08/18/2020 T. TYSOR Performed at [facility], [place] [address],  [place], [place] [address]    Culture (A)  Final    KLEBSIELLA PNEUMONIAE Confirmed Extended Spectrum Beta-Lactamase Producer (ESBL).  In bloodstream infections from ESBL organisms, carbapenems are preferred over piperacillin/tazobactam. They are shown to have a lower risk of mortality.    Report Status 08/19/2020 FINAL  Final   Organism ID, Bacteria KLEBSIELLA PNEUMONIAE  Final      Susceptibility   Klebsiella pneumoniae - MIC*    AMPICILLIN >=32 RESISTANT Resistant     CEFAZOLIN >=64 RESISTANT Resistant     CEFEPIME 8 INTERMEDIATE Intermediate  CEFTAZIDIME RESISTANT Resistant     CEFTRIAXONE >=64 RESISTANT Resistant     CIPROFLOXACIN <=0.25 SENSITIVE Sensitive     GENTAMICIN >=16 RESISTANT Resistant     IMIPENEM <=0.25 SENSITIVE Sensitive     TRIMETH/SULFA >=320 RESISTANT Resistant     AMPICILLIN/SULBACTAM >=32 RESISTANT Resistant     PIP/TAZO 8 SENSITIVE Sensitive     * KLEBSIELLA PNEUMONIAE  Blood Culture ID Panel (Reflexed)     Status: Abnormal   Collection Time: 08/17/20  8:51 AM  Result Value Ref Range Status   Enterococcus faecalis NOT DETECTED NOT DETECTED Final   Enterococcus Faecium NOT DETECTED NOT DETECTED Final   Listeria monocytogenes NOT DETECTED NOT DETECTED Final   Staphylococcus species NOT DETECTED NOT DETECTED Final   Staphylococcus aureus (BCID) NOT DETECTED NOT DETECTED Final   Staphylococcus epidermidis NOT DETECTED NOT DETECTED Final   Staphylococcus lugdunensis NOT DETECTED NOT DETECTED Final   Streptococcus species NOT DETECTED NOT DETECTED Final   Streptococcus agalactiae NOT DETECTED NOT DETECTED Final   Streptococcus pneumoniae NOT DETECTED NOT DETECTED Final   Streptococcus pyogenes NOT DETECTED NOT DETECTED Final   A.calcoaceticus-baumannii NOT DETECTED NOT DETECTED Final   Bacteroides fragilis NOT DETECTED NOT DETECTED Final   Enterobacterales DETECTED (A) NOT DETECTED Final    Comment: Enterobacterales represent a large order of gram negative  bacteria, not a single organism. CRITICAL RESULT CALLED TO, READ BACK BY AND VERIFIED WITH: G. ABBOTT,PHARMD 0222 08/18/2020 T. TYSOR    Enterobacter cloacae complex NOT DETECTED NOT DETECTED Final   Escherichia coli NOT DETECTED NOT DETECTED Final   Klebsiella aerogenes NOT DETECTED NOT DETECTED Final   Klebsiella oxytoca NOT DETECTED NOT DETECTED Final   Klebsiella pneumoniae DETECTED (A) NOT DETECTED Final    Comment: CRITICAL RESULT CALLED TO, READ BACK BY AND VERIFIED WITH: G. ABBOTT,PHARMD 0222 08/18/2020 T. TYSOR    Proteus species NOT DETECTED NOT DETECTED Final   Salmonella species NOT DETECTED NOT DETECTED Final   Serratia marcescens NOT DETECTED NOT DETECTED Final   Haemophilus influenzae NOT DETECTED NOT DETECTED Final   Neisseria meningitidis NOT DETECTED NOT DETECTED Final   Pseudomonas aeruginosa NOT DETECTED NOT DETECTED Final   Stenotrophomonas maltophilia NOT DETECTED NOT DETECTED Final   Candida albicans NOT DETECTED NOT DETECTED Final   Candida auris NOT DETECTED NOT DETECTED Final   Candida glabrata NOT DETECTED NOT DETECTED Final   Candida krusei NOT DETECTED NOT DETECTED Final   Candida parapsilosis NOT DETECTED NOT DETECTED Final   Candida tropicalis NOT DETECTED NOT DETECTED Final   Cryptococcus neoformans/gattii NOT DETECTED NOT DETECTED Final   CTX-M ESBL DETECTED (A) NOT DETECTED Final    Comment: CRITICAL RESULT CALLED TO, READ BACK BY AND VERIFIED WITH: G. ABBOTT,PHARMD 0222 08/18/2020 T. TYSOR (NOTE) Extended spectrum beta-lactamase detected. Recommend a carbapenem as initial therapy.      Carbapenem resistance IMP NOT DETECTED NOT DETECTED Final   Carbapenem resistance KPC NOT DETECTED NOT DETECTED Final   Carbapenem resistance NDM NOT DETECTED NOT DETECTED Final   Carbapenem resist OXA 48 LIKE NOT DETECTED NOT DETECTED Final   Carbapenem resistance VIM NOT DETECTED NOT DETECTED Final    Comment: Performed at [facility], [place] [address] [place], [place] [address]  Respiratory Panel by RT PCR (Flu A&B, Covid) - Nasopharyngeal Swab     Status: None   Collection Time: 08/17/20  9:56 AM   Specimen: Nasopharyngeal Swab  Result Value Ref Range Status   SARS Coronavirus 2  by RT PCR NEGATIVE NEGATIVE Final    Comment: (NOTE) SARS-CoV-2 target nucleic acids are NOT DETECTED.  The SARS-CoV-2 RNA is generally detectable in upper respiratoy specimens during the acute phase of infection. The lowest concentration of SARS-CoV-2 viral copies this assay can detect is 131 copies/mL. A negative result does not preclude SARS-Cov-2 infection and should not be used as the sole basis for treatment or other patient management decisions. A negative result may occur with  improper specimen collection/handling, submission of specimen other than nasopharyngeal swab, presence of viral mutation(s) within the areas targeted by this assay, and inadequate number of viral copies (<131 copies/mL). A negative result must be combined with clinical observations, patient history, and epidemiological information. The expected result is Negative.  Fact Sheet for Patients:  PinkCheek.be  Fact Sheet for Healthcare Providers:  GravelBags.it  This test is no t yet approved or cleared by the [place] FDA and  has been authorized for detection and/or diagnosis of SARS-CoV-2 by FDA under an Emergency Use Authorization (EUA). This EUA will remain  in effect (meaning this test can be used) for the duration of the COVID-19 declaration under Section 564(b)(1) of the Act, 21 U.S.C. section 360bbb-3(b)(1), unless the authorization is terminated or revoked sooner.     Influenza A by PCR NEGATIVE NEGATIVE Final   Influenza B by PCR NEGATIVE NEGATIVE Final    Comment: (NOTE) The Xpert Xpress SARS-CoV-2/FLU/RSV assay is intended as an aid in  the diagnosis of influenza from Nasopharyngeal swab specimens  and  should not be used as a sole basis for treatment. Nasal washings and  aspirates are unacceptable for Xpert Xpress SARS-CoV-2/FLU/RSV  testing.  Fact Sheet for Patients: PinkCheek.be  Fact Sheet for Healthcare Providers: GravelBags.it  This test is not yet approved or cleared by the [place] FDA and  has been authorized for detection and/or diagnosis of SARS-CoV-2 by  FDA under an Emergency Use Authorization (EUA). This EUA will remain  in effect (meaning this test can be used) for the duration of the  Covid-19 declaration under Section 564(b)(1) of the Act, 21  U.S.C. section 360bbb-3(b)(1), unless the authorization is  terminated or revoked. Performed at [facility], [place] [address], [place], [place] [address]   Culture, Urine     Status: Abnormal   Collection Time: 08/17/20  1:27 PM   Specimen: Urine, Random  Result Value Ref Range Status   Specimen Description URINE, RANDOM  Final   Special Requests   Final    NONE Performed at [facility], [place] [address], [place], [place] [address]    Culture MULTIPLE SPECIES PRESENT, SUGGEST RECOLLECTION (A)  Final   Report Status 08/19/2020 FINAL  Final  Blood culture (routine x 2)     Status: Abnormal   Collection Time: 08/18/20  1:35 AM   Specimen: BLOOD RIGHT HAND  Result Value Ref Range Status   Specimen Description BLOOD RIGHT HAND  Final   Special Requests   Final    BOTTLES DRAWN AEROBIC AND ANAEROBIC Blood Culture adequate volume   Culture  Setup Time   Final    GRAM NEGATIVE RODS IN BOTH AEROBIC AND ANAEROBIC BOTTLES CRITICAL VALUE NOTED.  VALUE IS CONSISTENT WITH PREVIOUSLY REPORTED AND CALLED VALUE.    Culture (A)  Final    KLEBSIELLA PNEUMONIAE SUSCEPTIBILITIES PERFORMED ON PREVIOUS CULTURE WITHIN THE LAST 5 DAYS. Performed at [facility], [place] [address], [place],  [address]    Report  Status 08/20/2020 FINAL  Final   Culture, fungus without smear     Status: None (Preliminary result)   Collection Time: 08/18/20  8:35 PM   Specimen: Urine, Cystoscope; Urinary  Result Value Ref Range Status   Specimen Description FLUID  Final   Special Requests Urine, Cystoscopy  Final   Culture   Final    NO FUNGUS ISOLATED AFTER 3 DAYS Performed at [facility], [place] [address] [place], [place] [address]    Report Status PENDING  Incomplete  Aerobic/Anaerobic Culture (surgical/deep wound)     Status: None (Preliminary result)   Collection Time: 08/18/20  8:35 PM   Specimen: Urine, Cystoscope; Urinary  Result Value Ref Range Status   Specimen Description FLUID  Final   Special Requests Urine, Cystoscopy  Final   Gram Stain   Final    MODERATE WBC PRESENT, PREDOMINANTLY PMN FEW GRAM VARIABLE ROD Performed at [facility], [place] [address] [place], [place] [address]    Culture   Final    FEW KLEBSIELLA PNEUMONIAE Confirmed Extended Spectrum Beta-Lactamase Producer (ESBL).  In bloodstream infections from ESBL organisms, carbapenems are preferred over piperacillin/tazobactam. They are shown to have a lower risk of mortality. NO ANAEROBES ISOLATED; CULTURE IN PROGRESS FOR 5 DAYS    Report Status PENDING  Incomplete   Organism ID, Bacteria KLEBSIELLA PNEUMONIAE  Final      Susceptibility   Klebsiella pneumoniae - MIC*    AMPICILLIN >=32 RESISTANT Resistant     CEFAZOLIN >=64 RESISTANT Resistant     CEFEPIME >=32 RESISTANT Resistant     CEFTAZIDIME RESISTANT Resistant     CEFTRIAXONE >=64 RESISTANT Resistant     CIPROFLOXACIN <=0.25 SENSITIVE Sensitive     GENTAMICIN >=16 RESISTANT Resistant     IMIPENEM <=0.25 SENSITIVE Sensitive     TRIMETH/SULFA >=320 RESISTANT Resistant     AMPICILLIN/SULBACTAM >=32 RESISTANT Resistant     PIP/TAZO 16 SENSITIVE Sensitive     * FEW KLEBSIELLA PNEUMONIAE  Acid Fast Smear (AFB)     Status: None   Collection Time: 08/18/20  8:35 PM   Specimen: Urine, Cystoscope;  Urinary  Result Value Ref Range Status   AFB Specimen Processing Concentration  Final   Acid Fast Smear Negative  Final    Comment: (NOTE) Performed At: [facility] [address] [place], [place] [id number] Rush Farmer MD YO:[id number]    Source (AFB) Urine, Cystoscopy  Final    Comment: Performed at [facility], [place] [address], [place], [place] [address]    Janene Madeira, MSN, NP-C [facility] [email or web address] Pager: [phone] Office: [phone] [facility] Main Line: [phone]   08/22/2020 1:26 PM

## 2020-08-22 NOTE — Evaluation (Signed)
Occupational Therapy Evaluation Patient Details Name: Whitney [name] MRN: [id number] DOB: 1935/06/25 Today's Date: 08/22/2020    History of Present Illness Whitney [name] is 85 year old female with a past medical history of insulin-dependent diabetes, CKD stage III, and chronic right hip fracture that presented to [facility] with 3 week history of nausea and vomiting complicated by the development of abdominal pain, dysuria found to have septic shock secondary to emphysematous cystitis and left-sided pyelitis.   Clinical Impression   Pt admitted with the above diagnoses and presents with below problem list. Pt will benefit from continued acute OT to address the below listed deficits and maximize independence with basic ADLs prior to d/c to next venue. Pt is unable to provide PLOF data 2/2 impaired cognition. Per chart review, pt is bed bound at home, able to roll with assist, less assist needed for UB ADLs as LB ADLs. Arabic video interpreter utilized this session. Able to feed self with setup provided. Session limited by abdominal pain, pt's desire to eat lunch, and suspect she would need +2 to safe complete bed mobility and only +1 assist available at time of OT eval. No family present.      Follow Up Recommendations  SNF;Supervision/Assistance - 24 hour    Equipment Recommendations  Other (comment) (defer to next venue)    Recommendations for Other Services       Precautions / Restrictions Precautions Precautions: Fall Precaution Comments: chronic R Hip dislocation      Mobility Bed Mobility               General bed mobility comments: bed bound at baseline. did not attempt rolling second to pain and pt wanting to eat lunch. suspect she would acurrently need +2 to roll.  Transfers                      Balance                                           ADL either performed or assessed with clinical judgement   ADL Overall ADL's : Needs  assistance/impaired Eating/Feeding: Set up;Bed level   Grooming: Maximal assistance;Bed level   Upper Body Bathing: Maximal assistance;Bed level   Lower Body Bathing: Total assistance;Bed level   Upper Body Dressing : Maximal assistance;Bed level   Lower Body Dressing: Total assistance;Bed level                 General ADL Comments: eating lunch with setup provided.      Vision         Perception     Praxis      Pertinent Vitals/Pain Pain Assessment: Faces Faces Pain Scale: Hurts even more Pain Location: abdomen/bladder at rest Pain Descriptors / Indicators: Guarding;Constant Pain Intervention(s): Limited activity within patient's tolerance;Monitored during session;Repositioned     Hand Dominance Right   Extremity/Trunk Assessment Upper Extremity Assessment Upper Extremity Assessment: RUE deficits/detail;LUE deficits/detail RUE Deficits / Details: AROM WFL, strength at least 3/5 shoulder flexion elbow flex 4-/5 LUE Deficits / Details: reports "broke" L shoulder, elbow AROM WFL, shoulder AAROM to about 80 with pain does not lift antigravity, elbow flexion strength about 3+/5   Lower Extremity Assessment Lower Extremity Assessment: Defer to PT evaluation       Communication Communication Communication: Prefers language other than English (Arabic)   Cognition Arousal/Alertness: Awake/alert Behavior During  Therapy: Anxious Overall Cognitive Status: No family/caregiver present to determine baseline cognitive functioning                                 General Comments: Pt appears internally distracted. Sustained attention level.    General Comments       Exercises     Shoulder Instructions      Home Living Family/patient expects to be discharged to:: Private residence Living Arrangements: Children Available Help at Discharge: Family Type of Home: Apartment                           Additional Comments: per chart review       Prior Functioning/Environment Level of Independence: Needs assistance  Gait / Transfers Assistance Needed: patient reports bedbound, has not sat up at EOB in about a year, but per previous records pt able to roll in bed with rails with assist ADL's / Homemaking Assistance Needed: per previous PT note 7/21: "requires assistance for all ADLs, not totalA, able to utilize BUE to assist with feeding, bathing, dressing, etc. Requires at least setup for all ADLs"            OT Problem List: Decreased strength;Decreased range of motion;Decreased activity tolerance;Impaired balance (sitting and/or standing);Decreased cognition;Decreased safety awareness;Decreased knowledge of precautions;Decreased knowledge of use of DME or AE;Pain;Impaired UE functional use      OT Treatment/Interventions: Self-care/ADL training;DME and/or AE instruction;Energy conservation;Therapeutic activities;Patient/family education;Balance training;Cognitive remediation/compensation    OT Goals(Current goals can be found in the care plan section) Acute Rehab OT Goals Patient Stated Goal: to help pain OT Goal Formulation: Patient unable to participate in goal setting Time For Goal Achievement: 09/05/20 Potential to Achieve Goals: Fair ADL Goals Pt Will Perform Eating: with set-up;bed level Pt Will Perform Grooming: with min assist;bed level Pt Will Perform Upper Body Bathing: with min assist;bed level  OT Frequency: Min 2X/week   Barriers to D/C:            Co-evaluation              AM-PAC OT "6 Clicks" Daily Activity     Outcome Measure Help from another person eating meals?: A Little Help from another person taking care of personal grooming?: A Lot Help from another person toileting, which includes using toliet, bedpan, or urinal?: Total Help from another person bathing (including washing, rinsing, drying)?: Total Help from another person to put on and taking off regular upper body clothing?: A  Lot Help from another person to put on and taking off regular lower body clothing?: Total 6 Click Score: 10   End of Session    Activity Tolerance: Patient limited by pain;Patient limited by fatigue Patient left: in bed;with call bell/phone within reach;with bed alarm set  OT Visit Diagnosis: Muscle weakness (generalized) (M62.81);Other abnormalities of gait and mobility (R26.89);Other symptoms and signs involving cognitive function;Pain                Time: 1255-1305 OT Time Calculation (min): 10 min Charges:  OT General Charges $OT Visit: 1 Visit OT Evaluation $OT Eval Low Complexity: [place], OT Acute Rehabilitation Services Pager: [phone] Office: [phone]   Hortencia Pilar 08/22/2020, 3:00 PM

## 2020-08-22 NOTE — Progress Notes (Addendum)
Subjective:  Whitney [name] is 85 year old female with a past medical history of insulin-dependent diabetes, CKD stage III, and chronic right hip fracture that presented to [facility] with3 week history ofnausea and vomiting complicated by the development of abdominal pain, dysuria found to have septic shock secondary to emphysematous cystitis and left-sided pyelitis.  Yesterday afternoon, patient noted to have progressively worsening hypotension (98/38), she received 500cc bolus of LR and placed on continuous LR 139mL/hr with improvement of her blood pressure.  Overnight, no acute events.  This morning, daugther is in the room and assists with translation. Patient reports that she still has some abdominal pain. We reviewed lab results and imaging finding and general plan with daughter and that she is making lot of progress. Per daughter, patient's intake is improved and she eats much better. We encouraged patient to drink the protein shake, recommended by nutritionist  Objective:  Vital signs in last 24 hours: Vitals:   08/22/20 0407 08/22/20 0500 08/22/20 0737 08/22/20 1146  BP: 125/70  (!) 104/53 (!) 141/64  Pulse: 94  92 98  Resp:   13 12  Temp: 97.6 F (36.4 C)  97.9 F (36.6 C) 97.6 F (36.4 C)  TempSrc: Oral  Axillary Axillary  SpO2: 91%  96% 97%  Weight:  88 kg    Height:      SpO2: 97 % O2 Flow Rate (L/min): 2 L/min Filed Weights   08/20/20 0500 08/21/20 0500 08/22/20 0500  Weight: 88.1 kg 85 kg 88 kg    Intake/Output Summary (Last 24 hours) at 08/22/2020 1456 Last data filed at 08/22/2020 1439 Gross per 24 hour  Intake 2339.08 ml  Output 340 ml  Net 1999.08 ml   General: Elderly female laying in bed, smiling, NAD,  Cardiovascular: Normal rate, regular rhythm. No murmurs, rubs, or gallops Pulmonary: breath sounds normal. No wheezes, rales, or rhonchi Abdominal: bowel sounds present, abdomen tense and distended, umbilical hernia present, tender to palpation in suprapubic  region and left abdomen.  CBC Latest Ref Rng & Units 08/22/2020 08/21/2020 08/20/2020  WBC 4.0 - 10.5 K/uL 13.8(H) 16.9(H) 18.9(H)  Hemoglobin 12.0 - 15.0 g/dL 9.8(L) 10.4(L) 8.2(L)  Hematocrit 36 - 46 % 31.4(L) 31.8(L) 25.5(L)  Platelets 150 - 400 K/uL 153 124(L) 178   CMP Latest Ref Rng & Units 08/22/2020 08/21/2020 08/20/2020  Glucose 70 - 99 mg/dL 114(H) 109(H) 302(H)  BUN 8 - 23 mg/dL 42(H) 47(H) 51(H)  Creatinine 0.44 - 1.00 mg/dL 1.69(H) 1.75(H) 1.94(H)  Sodium 135 - 145 mmol/L 140 139 138  Potassium 3.5 - 5.1 mmol/L 4.7 4.4 4.4  Chloride 98 - 111 mmol/L 111 111 111  CO2 22 - 32 mmol/L 21(L) 21(L) 21(L)  Calcium 8.9 - 10.3 mg/dL 7.9(L) 8.0(L) 7.8(L)  Total Protein 6.5 - 8.1 g/dL 5.8(L) 5.8(L) 5.1(L)  Total Bilirubin 0.3 - 1.2 mg/dL 0.8 1.0 0.7  Alkaline Phos 38 - 126 U/L 170(H) 180(H) 120  AST 15 - 41 U/L 27 37 20  ALT 0 - 44 U/L 14 14 12    Assessment/Plan:  Principal Problem:   Emphysematous pyelonephritis of left kidney Active Problems:   Sepsis (HCC)   Pressure injury of skin   Bacteremia due to Klebsiella pneumoniae  Whitney [name] is 85 year old female with a past medical history of insulin-dependent diabetes, CKD stage III, and chronic right hip fracture that presented to [facility] with 3 week history of nausea and vomiting complicated by the development of abdominal pain, dysuria found to have septic shock secondary  to emphysematous cystitis and left-sided pyelitis.  #Emphysematous cystitis and left emphysematous pyelitis, active #ESBL-producing Klebsiella bacteremia, active Patient presented with septic shock 2/2 ESBL-producing Klebsiella bacteremia. Found to have emphysematous cystitis and left-sided emphysematous pylelitis. Patient is s/p Left Ureteral Stent placement and Foley Catheter drainage. CT abdomen and pelvis wo contrast completed which revealed decompression of left renal collecting system and no further gas. Dr.Gay, urologist, following peripherally and has  recommended keeping Foley to gravity and continuing antibiotics for a prolonged course. Given the need for a prolonged course of antibiotics, we will consult ID for recommendations. -Urology following, greatly appreciate recommendations. -Continue meropenem -Consult ID for recommendations regarding ABx course and plan and probably PICC line -Tylenol for moderate pain -Dilaudid for severe pain -Monitor I/Os -PT/OT eval and treat  #AKI on CKD Stage III, active Patient with a history of CKDIII presented with Creatinine of 2.16, 1.3 at baseline, most recently at 1.69. Most likely a component of prerenal and postrenal. Stent placed for obstruction. As patient is tolerating oral intake, we will discontinue IVF at this time. -CMP daily  #Hypoalbuminemia Patient with significant hypoalbuminemia (1.5). RD was consulted and recommended Glucerna shake TID and Prosource plus TID for protein supplementation. As patient's hypoalbuminemia improves, suspect that intravascular volume status will significantly improve. -Continue Glucerna shake TID -Prosource plus TID -CMP daily  #T2DM, chronic A1c 7.8. Patient presented with hyperglycemia, BG 431. Her daughter helps administer basal insulin at home. We are administering Lantus 5 units with SSI. Blood sugar above goal today. Patient has resumed po intake. Will continue to trend and make adjustments -CBG monitoring -Basal : Increase Lantus to 5 units QHS -SSI -M, Correction scale QHS  Code status: Full Code VTE ppx: Heparin IVF: none Diet: Dysphagia 1 Bowel Regimen: Senna-docusate twice daily  Cato Mulligan, MD 08/21/2020, 6:01 AM Pager: [phone] After 5pm on weekdays and 1pm on weekends: On Call pager [phone]

## 2020-08-23 ENCOUNTER — Inpatient Hospital Stay (HOSPITAL_COMMUNITY): Payer: 59

## 2020-08-23 DIAGNOSIS — M25552 Pain in left hip: Secondary | ICD-10-CM

## 2020-08-23 HISTORY — PX: IR FLUORO GUIDE CV LINE RIGHT: IMG2283

## 2020-08-23 HISTORY — PX: IR US GUIDE VASC ACCESS RIGHT: IMG2390

## 2020-08-23 LAB — CBC WITH DIFFERENTIAL/PLATELET
Abs Immature Granulocytes: 0.15 10*3/uL — ABNORMAL HIGH (ref 0.00–0.07)
Basophils Absolute: 0 10*3/uL (ref 0.0–0.1)
Basophils Relative: 0 %
Eosinophils Absolute: 0.1 10*3/uL (ref 0.0–0.5)
Eosinophils Relative: 1 %
HCT: 27.3 % — ABNORMAL LOW (ref 36.0–46.0)
Hemoglobin: 8.8 g/dL — ABNORMAL LOW (ref 12.0–15.0)
Immature Granulocytes: 1 %
Lymphocytes Relative: 16 %
Lymphs Abs: 2.1 10*3/uL (ref 0.7–4.0)
MCH: 29.4 pg (ref 26.0–34.0)
MCHC: 32.2 g/dL (ref 30.0–36.0)
MCV: 91.3 fL (ref 80.0–100.0)
Monocytes Absolute: 1.2 10*3/uL — ABNORMAL HIGH (ref 0.1–1.0)
Monocytes Relative: 9 %
Neutro Abs: 9.9 10*3/uL — ABNORMAL HIGH (ref 1.7–7.7)
Neutrophils Relative %: 73 %
Platelets: UNDETERMINED 10*3/uL (ref 150–400)
RBC: 2.99 MIL/uL — ABNORMAL LOW (ref 3.87–5.11)
RDW: 15.9 % — ABNORMAL HIGH (ref 11.5–15.5)
WBC: 13.5 10*3/uL — ABNORMAL HIGH (ref 4.0–10.5)
nRBC: 0 % (ref 0.0–0.2)

## 2020-08-23 LAB — GLUCOSE, CAPILLARY
Glucose-Capillary: 129 mg/dL — ABNORMAL HIGH (ref 70–99)
Glucose-Capillary: 130 mg/dL — ABNORMAL HIGH (ref 70–99)
Glucose-Capillary: 113 mg/dL — ABNORMAL HIGH (ref 70–99)
Glucose-Capillary: 109 mg/dL — ABNORMAL HIGH (ref 70–99)
Glucose-Capillary: 157 mg/dL — ABNORMAL HIGH (ref 70–99)
Glucose-Capillary: 151 mg/dL — ABNORMAL HIGH (ref 70–99)

## 2020-08-23 LAB — AEROBIC/ANAEROBIC CULTURE W GRAM STAIN (SURGICAL/DEEP WOUND)

## 2020-08-23 LAB — COMPREHENSIVE METABOLIC PANEL
ALT: 16 U/L (ref 0–44)
AST: 36 U/L (ref 15–41)
Albumin: 1.3 g/dL — ABNORMAL LOW (ref 3.5–5.0)
Alkaline Phosphatase: 241 U/L — ABNORMAL HIGH (ref 38–126)
Anion gap: 5 (ref 5–15)
BUN: 46 mg/dL — ABNORMAL HIGH (ref 8–23)
CO2: 22 mmol/L (ref 22–32)
Calcium: 7.6 mg/dL — ABNORMAL LOW (ref 8.9–10.3)
Chloride: 112 mmol/L — ABNORMAL HIGH (ref 98–111)
Creatinine, Ser: 1.71 mg/dL — ABNORMAL HIGH (ref 0.44–1.00)
GFR, Estimated: 27 mL/min — ABNORMAL LOW (ref >60)
Glucose, Bld: 123 mg/dL — ABNORMAL HIGH (ref 70–99)
Potassium: 5 mmol/L (ref 3.5–5.1)
Sodium: 139 mmol/L (ref 135–145)
Total Bilirubin: 0.8 mg/dL (ref 0.3–1.2)
Total Protein: 5.1 g/dL — ABNORMAL LOW (ref 6.5–8.1)

## 2020-08-23 MED ORDER — LIDOCAINE HCL 1 % IJ SOLN
INTRAMUSCULAR | Status: AC
  Filled 2020-08-23: qty 20

## 2020-08-23 MED ORDER — INSULIN ASPART 100 UNIT/ML ~~LOC~~ SOLN
0.0000 [IU] | Freq: Every day | SUBCUTANEOUS | Status: DC
  Administered 2020-08-26: 2 [IU] via SUBCUTANEOUS

## 2020-08-23 MED ORDER — LIDOCAINE HCL 1 % IJ SOLN
INTRAMUSCULAR | Status: DC | PRN
  Administered 2020-08-23: 20 mL via INTRADERMAL

## 2020-08-23 MED ORDER — INSULIN ASPART 100 UNIT/ML ~~LOC~~ SOLN
0.0000 [IU] | Freq: Three times a day (TID) | SUBCUTANEOUS | Status: DC
  Administered 2020-08-23: 2 [IU] via SUBCUTANEOUS
  Administered 2020-08-24 – 2020-08-25 (×4): 1 [IU] via SUBCUTANEOUS
  Administered 2020-08-25: 2 [IU] via SUBCUTANEOUS
  Administered 2020-08-26: 3 [IU] via SUBCUTANEOUS
  Administered 2020-08-26: 1 [IU] via SUBCUTANEOUS
  Administered 2020-08-27: 2 [IU] via SUBCUTANEOUS
  Administered 2020-08-27: 1 [IU] via SUBCUTANEOUS
  Administered 2020-08-28: 2 [IU] via SUBCUTANEOUS
  Administered 2020-08-28 – 2020-08-31 (×4): 1 [IU] via SUBCUTANEOUS

## 2020-08-23 MED ORDER — TAMSULOSIN HCL 0.4 MG PO CAPS
0.4000 mg | ORAL_CAPSULE | Freq: Every day | ORAL | Status: DC
  Administered 2020-08-23 – 2020-08-30 (×8): 0.4 mg via ORAL
  Filled 2020-08-23 (×9): qty 1

## 2020-08-23 MED ORDER — POLYETHYLENE GLYCOL 3350 17 G PO PACK
17.0000 g | PACK | Freq: Two times a day (BID) | ORAL | Status: DC
  Administered 2020-08-23 – 2020-08-29 (×8): 17 g via ORAL
  Filled 2020-08-23 (×8): qty 1

## 2020-08-23 MED ORDER — INSULIN GLARGINE 100 UNIT/ML ~~LOC~~ SOLN
4.0000 [IU] | Freq: Every day | SUBCUTANEOUS | Status: DC
  Administered 2020-08-23 – 2020-08-29 (×7): 4 [IU] via SUBCUTANEOUS
  Filled 2020-08-23 (×9): qty 0.04

## 2020-08-23 NOTE — Progress Notes (Addendum)
Pt states via interpreter that she is experiencing left-sided pain, pain around catheter site, and tightness/fluid in abdomen. Secure chat sent to Dr. Abner Greenspan with urology. He believes the pain to be related to bladder spasms/stent discomfort. New orders for Flomax and to remove foley and begin voiding trial. Will bladder scan for any post-void residuals, if no urination will bladder scan in 6 hours.   1604: Foley was removed this am for voiding trial. Pt has not voided. Pt just arrived back to the unit from IR and was bladder scanned, with 124 mL being the most shown. A few other scans showed 54 and 58 mL. Internal med MD just called this RN to check on status of voiding trial and was updated. Dr. Abner Greenspan was also secure chatted for further orders. 1607: Dr. Abner Greenspan responded to secure chat to give pt more time to void. Orders to replace foley if pt has not voided in a total of 8 hours from foley removal.   1845: Pt had still not voided. Supplies gathered to place foley. When pt was repositioned for foley placement an unmeasured amount of urine was in her brief. Pt bladder scanned at 116 and Dr. Abner Greenspan again contacted. Pt then began urinating. Post-void residual bladder scan 0 mL. Will pass along to nightshift to continue to keep close eye on pt's output and bladder scan as needed.  Justice Rocher, RN

## 2020-08-23 NOTE — Consult Note (Signed)
Patient Status: [facility] - In-pt  Assessment and Plan: Patient in need of long term venous access for long term IV antibiotic use.  Plan for image-guided tunneled CVC placement in IR tentatively for today pending IR scheduling. Afebrile.  Teleinterpretor #[id number] was used throughout today's interaction.  Risks and benefits discussed with the patient including, but not limited to bleeding, infection, vascular injury, pneumothorax which may require chest tube placement, air embolism or even death. All of the patient's questions were answered, patient is agreeable to proceed. Consent obtained by patient's daughter, Milta Deiters, via telephone, per patient's request- signed and in IR control room.  ______________________________________________________________________   History of Present Illness: Whitney [name] is a 85 y.o. female with a past medical history of diabetes mellitus, CKD stage III, and chronic right hip fracture. She has been admitted to [facility] since 08/17/2020 for management of sepsis secondary to emphysematous pyelonephritis/cystitis. She underwent cystoscopy with left retrogram pyelogram, left ureteral stent placement, and foley catheter placement in OR 08/18/2020 by Dr. Abner Greenspan. Due to sepsis, ID was consulted who recommended 14 days of IV antibiotics (to be completed minimum 10/20). In addition, has infiltrated her left PIV.  IR consulted by Dr. Dareen Piano for possible image-guided tunneled CVC placement. Patient awake and alert laying in bed. Complains of severe LLE pain- RN updated. Denies fever, chills, chest pain, dyspnea, abdominal pain, or headache.   Allergies and medications reviewed.   Review of Systems: A 12 point ROS discussed and pertinent positives are indicated in the HPI above.  All other systems are negative.  Review of Systems  Constitutional: Negative for chills and fever.  Respiratory: Negative for shortness of breath and wheezing.   Cardiovascular: Negative  for chest pain and palpitations.  Gastrointestinal: Negative for abdominal pain.  Musculoskeletal:       Positive for LLE pain.  Neurological: Negative for headaches.  Psychiatric/Behavioral: Negative for behavioral problems and confusion.    Vital Signs: BP (!) 107/54 (BP Location: Right Arm)   Pulse 72   Temp 97.9 F (36.6 C) (Axillary)   Resp 13   Ht 5\' 3"  (1.6 m)   Wt 194 lb 0.1 oz (88 kg)   SpO2 98%   BMI 34.37 kg/m   Physical Exam Vitals and nursing note reviewed.  Constitutional:      General: She is not in acute distress. Cardiovascular:     Rate and Rhythm: Normal rate and regular rhythm.     Heart sounds: Normal heart sounds. No murmur heard.   Pulmonary:     Effort: Pulmonary effort is normal. No respiratory distress.     Breath sounds: Normal breath sounds. No wheezing.  Skin:    General: Skin is warm and dry.  Neurological:     Mental Status: She is alert and oriented to person, place, and time.      Imaging reviewed.   Labs:  COAGS: Recent Labs    03/02/20 2117  INR 1.2    BMP: Recent Labs    03/18/20 1110 03/19/20 0508 03/23/20 1137 03/23/20 1137 03/26/20 0704 05/27/20 1033 08/17/20 1020 08/20/20 0350 08/21/20 0446 08/22/20 0348 08/23/20 0638  NA   < >  --  143   < >  --  138   < > 138 139 140 139  K   < >  --  4.6   < >  --  4.2   < > 4.4 4.4 4.7 5.0  CL   < >  --  114*   < >  --  107   < > 111 111 111 112*  CO2   < >  --  23   < >  --  25   < > 21* 21* 21* 22  GLUCOSE   < >  --  139*   < >  --  269*   < > 302* 109* 114* 123*  BUN   < >  --  26*   < >  --  37*   < > 51* 47* 42* 46*  CALCIUM   < >  --  7.3*   < >  --  8.3*   < > 7.8* 8.0* 7.9* 7.6*  CREATININE   < > 1.47* 1.34*   < > 1.28* 1.35*   < > 1.94* 1.75* 1.69* 1.71*  GFRNONAA   < > 33* 37*   < > 39* 36*   < > 23* 26* 28* 27*  GFRAA  --  38* 43*  --  45* 42*  --   --   --   --   --    < > = values in this interval not displayed.       Electronically  Signed: Earley Abide, PA-C 08/23/2020, 11:42 AM   I spent a total of 15 minutes in face to face in clinical consultation, greater than 50% of which was counseling/coordinating care for venous access.

## 2020-08-23 NOTE — Procedures (Signed)
Interventional Radiology Procedure Note  Procedure: Tunneled CVC placement  Complications: None  Estimated Blood Loss: < 10 mL  Findings: Right IJ, 6 Fr, tunneled DL Power Line placed. Cut to 22 cm. Tip at SVC/RA junction.  Venetia Night. Kathlene Cote, M.D Pager:  [phone]

## 2020-08-23 NOTE — Progress Notes (Signed)
PT Cancellation Note  Patient Details Name: Quinlee Sciarra MRN: [id number] DOB: August 16, 1935   Cancelled Treatment:    Reason Eval/Treat Not Completed: Patient at procedure or test/unavailable - Off the floor, PT to check back later.   [place] Pager [phone]  Office [phone] ]    Roxine Caddy D Gennie Eisinger 08/23/2020, 3:06 PM

## 2020-08-23 NOTE — Progress Notes (Signed)
Subjective:  [name] is 85 year old female with a past medical history of insulin-dependent diabetes, CKD stage III, and chronic right hip fracture that presented to [facility] with3 week history ofnausea and vomiting complicated by the development of abdominal pain, dysuria found to have septic shock secondary to emphysematous cystitis and left-sided pyelitis.  Overnight, no acute events.  This morning, patient is examined laying in bed. Her daughter translates via the phone. She feels her body is holding a lot of fluid. Her foley catheter was just removed and we explained we are watching to see if she can urinate. Her daughter ask if we could get her up, she says she only urinates at home when she is vertical. The patient also feels constipated. She is on senna/docuste, but we discussed the need to add more medication to her bowel regimen.   Objective:  Vital signs in last 24 hours: Vitals:   08/22/20 1928 08/22/20 2328 08/23/20 0346 08/23/20 0735  BP: 134/71 (!) 131/55 (!) 111/45 (!) 113/50  Pulse: 98 98 97 100  Resp: 14 15 15 12   Temp: 97.8 F (36.6 C) 98.2 F (36.8 C) 98.1 F (36.7 C) 98.5 F (36.9 C)  TempSrc: Axillary Axillary Oral Axillary  SpO2: 98% 98% 98% 100%  Weight:      Height:      SpO2: 100 % O2 Flow Rate (L/min): 2 L/min Filed Weights   08/20/20 0500 08/21/20 0500 08/22/20 0500  Weight: 88.1 kg 85 kg 88 kg    Intake/Output Summary (Last 24 hours) at 08/23/2020 1133 Last data filed at 08/23/2020 0830 Gross per 24 hour  Intake 1137 ml  Output 1025 ml  Net 112 ml   General: Elderly female laying in bed, smiling, NAD,  Cardiovascular: Normal rate, regular rhythm. No murmurs, rubs, or gallops Pulmonary: breath sounds normal. No wheezes, rales, or rhonchi Abdominal: bowel sounds present, abdomen tense and distended, umbilical hernia present, tender to palpation in suprapubic region and left abdomen.  CBC Latest Ref Rng & Units 08/23/2020 08/22/2020 08/21/2020   WBC 4.0 - 10.5 K/uL 13.5(H) 13.8(H) 16.9(H)  Hemoglobin 12.0 - 15.0 g/dL 8.8(L) 9.8(L) 10.4(L)  Hematocrit 36 - 46 % 27.3(L) 31.4(L) 31.8(L)  Platelets 150 - 400 K/uL PLATELET CLUMPS NOTED ON SMEAR, UNABLE TO ESTIMATE 153 124(L)   CMP Latest Ref Rng & Units 08/23/2020 08/22/2020 08/21/2020  Glucose 70 - 99 mg/dL 123(H) 114(H) 109(H)  BUN 8 - 23 mg/dL 46(H) 42(H) 47(H)  Creatinine 0.44 - 1.00 mg/dL 1.71(H) 1.69(H) 1.75(H)  Sodium 135 - 145 mmol/L 139 140 139  Potassium 3.5 - 5.1 mmol/L 5.0 4.7 4.4  Chloride 98 - 111 mmol/L 112(H) 111 111  CO2 22 - 32 mmol/L 22 21(L) 21(L)  Calcium 8.9 - 10.3 mg/dL 7.6(L) 7.9(L) 8.0(L)  Total Protein 6.5 - 8.1 g/dL 5.1(L) 5.8(L) 5.8(L)  Total Bilirubin 0.3 - 1.2 mg/dL 0.8 0.8 1.0  Alkaline Phos 38 - 126 U/L 241(H) 170(H) 180(H)  AST 15 - 41 U/L 36 27 37  ALT 0 - 44 U/L 16 14 14    DG Abd 1 View  Result Date: 08/22/2020 CLINICAL DATA:  Abdominal distension and pain. EXAM: ABDOMEN - 1 VIEW COMPARISON:  Mar 18, 2020. FINDINGS: The bowel gas pattern is normal. Left ureteral stent is noted in grossly good position. IVC filter is noted. No radio-opaque calculi or other significant radiographic abnormality are seen. IMPRESSION: No evidence of bowel obstruction or ileus. Left ureteral stent is noted in grossly good position. Electronically Signed  By: Marijo Conception M.D.   On: 08/22/2020 17:51   Assessment/Plan:  Principal Problem:   Pyelitis Active Problems:   Sepsis ([place])   Pressure injury of skin   Bacteremia due to Klebsiella pneumoniae   Abdominal distention   Acute cystitis with hematuria   Hip pain   Leukocytosis  [name] is 85 year old female with a past medical history of insulin-dependent diabetes, CKD stage III, and chronic right hip fracture that presented to [facility] with 3 week history of nausea and vomiting complicated by the development of abdominal pain, dysuria found to have septic shock secondary to emphysematous cystitis and left-sided  pyelitis.  #Emphysematous cystitis and left emphysematous pyelitis, active #ESBL-producing Klebsiella bacteremia, active Patient presented with septic shock 2/2 ESBL-producing Klebsiella bacteremia. Found to have emphysematous cystitis and left-sided emphysematous pylelitis. Patient is s/p Left Ureteral Stent placement and Foley Catheter drainage on 08/18/20. CT abdomen and pelvis wo contrast completed which revealed decompression of left renal collecting system and no further gas. Dr.Gay, urologist, following peripherally and has recommended voiding trial today with initiation of Flomax as well as continuing antibiotics for a prolonged course. Given the need for a prolonged course of antibiotics, we consulted ID for recommendations. ID recommended switching meropenem to ertapenem upon discharge which patient will receive via PICC. -Urology following peripherally, greatly appreciate recommendations. -Continue meropenem until discharge, then transition to ertapenem 500mg  IV q24h through 10/20 -IR consulted for PICC placement to receive ertapenem, patient should receive PICC placement today -Voiding trial today with addition of Flomax -Tylenol for moderate pain -Dilaudid for severe pain -Monitor I/Os -PT/OT continue to eval and treat (recommending SNF; Supervision/Assisstance - 24 hour)  #AKI on CKD Stage III, active Patient with a history of CKDIII presented with Creatinine of 2.16, 1.3 at baseline, most recently stable at 1.71 from 1.69. Most likely a component of prerenal and postrenal. Stent placed for obstruction. As patient is tolerating oral intake, we will discontinue IVF at this time. She is undergoing a voiding trial today with the addition of Flomax. -Voiding trial -Flomax -CMP daily  #Hypoalbuminemia Patient with significant hypoalbuminemia (1.2). RD was consulted and recommended Glucerna shake TID and Prosource plus TID for protein supplementation. Patient reports that she hasn't had  much of an appetite due to concern of abdominal fullness and constipation. We encouraged the importance of eating and taking protein supplementation to help with her symptoms. As patient's hypoalbuminemia improves, suspect that intravascular volume status will significantly improve. -Continue Glucerna shake TID -Prosource plus TID -CMP daily  #T2DM, chronic A1c 7.8. Patient's blood glucose well controlled on current regimen. -CBG monitoring -Basal: Decrease lantus from 5 units to 4 units QHS -Decrease sliding scale sensitivity to sensitive from moderate -Correction scale QHS  Code status: Full Code VTE ppx: Heparin IVF: none Diet: Dysphagia 1 Bowel Regimen: Senna-docusate and Miralax twice daily  Cato Mulligan, MD 08/21/2020, 6:01 AM Pager: [phone] After 5pm on weekdays and 1pm on weekends: On Call pager [phone]

## 2020-08-24 ENCOUNTER — Inpatient Hospital Stay (HOSPITAL_COMMUNITY): Payer: 59

## 2020-08-24 DIAGNOSIS — M79609 Pain in unspecified limb: Secondary | ICD-10-CM

## 2020-08-24 DIAGNOSIS — R609 Edema, unspecified: Secondary | ICD-10-CM | POA: Diagnosis not present

## 2020-08-24 DIAGNOSIS — M7989 Other specified soft tissue disorders: Secondary | ICD-10-CM | POA: Diagnosis not present

## 2020-08-24 LAB — CBC WITH DIFFERENTIAL/PLATELET
Abs Immature Granulocytes: 0.11 10*3/uL — ABNORMAL HIGH (ref 0.00–0.07)
Basophils Absolute: 0 10*3/uL (ref 0.0–0.1)
Basophils Relative: 0 %
Eosinophils Absolute: 0.2 10*3/uL (ref 0.0–0.5)
Eosinophils Relative: 2 %
HCT: 27 % — ABNORMAL LOW (ref 36.0–46.0)
Hemoglobin: 8.5 g/dL — ABNORMAL LOW (ref 12.0–15.0)
Immature Granulocytes: 1 %
Lymphocytes Relative: 15 %
Lymphs Abs: 1.5 10*3/uL (ref 0.7–4.0)
MCH: 29.3 pg (ref 26.0–34.0)
MCHC: 31.5 g/dL (ref 30.0–36.0)
MCV: 93.1 fL (ref 80.0–100.0)
Monocytes Absolute: 1 10*3/uL (ref 0.1–1.0)
Monocytes Relative: 10 %
Neutro Abs: 7.3 10*3/uL (ref 1.7–7.7)
Neutrophils Relative %: 72 %
Platelets: 160 10*3/uL (ref 150–400)
RBC: 2.9 MIL/uL — ABNORMAL LOW (ref 3.87–5.11)
RDW: 16.1 % — ABNORMAL HIGH (ref 11.5–15.5)
WBC: 10.1 10*3/uL (ref 4.0–10.5)
nRBC: 0 % (ref 0.0–0.2)

## 2020-08-24 LAB — COMPREHENSIVE METABOLIC PANEL
ALT: 15 U/L (ref 0–44)
AST: 32 U/L (ref 15–41)
Albumin: 1.3 g/dL — ABNORMAL LOW (ref 3.5–5.0)
Alkaline Phosphatase: 236 U/L — ABNORMAL HIGH (ref 38–126)
Anion gap: 8 (ref 5–15)
BUN: 47 mg/dL — ABNORMAL HIGH (ref 8–23)
CO2: 21 mmol/L — ABNORMAL LOW (ref 22–32)
Calcium: 7.6 mg/dL — ABNORMAL LOW (ref 8.9–10.3)
Chloride: 111 mmol/L (ref 98–111)
Creatinine, Ser: 1.56 mg/dL — ABNORMAL HIGH (ref 0.44–1.00)
GFR, Estimated: 30 mL/min — ABNORMAL LOW (ref >60)
Glucose, Bld: 165 mg/dL — ABNORMAL HIGH (ref 70–99)
Potassium: 4.9 mmol/L (ref 3.5–5.1)
Sodium: 140 mmol/L (ref 135–145)
Total Bilirubin: 1.3 mg/dL — ABNORMAL HIGH (ref 0.3–1.2)
Total Protein: 5.6 g/dL — ABNORMAL LOW (ref 6.5–8.1)

## 2020-08-24 LAB — GLUCOSE, CAPILLARY
Glucose-Capillary: 142 mg/dL — ABNORMAL HIGH (ref 70–99)
Glucose-Capillary: 134 mg/dL — ABNORMAL HIGH (ref 70–99)
Glucose-Capillary: 160 mg/dL — ABNORMAL HIGH (ref 70–99)
Glucose-Capillary: 156 mg/dL — ABNORMAL HIGH (ref 70–99)

## 2020-08-24 NOTE — Progress Notes (Signed)
Plan of care for the night is communicated to the patient through interpreter Nael  with # [id number]. Patient verbalized understanding. Will continue to monitor.

## 2020-08-24 NOTE — TOC Initial Note (Signed)
Transition of Care [facility]) - Initial/Assessment Note    Patient Details  Name: Catheline Hixon MRN: [id number] Date of Birth: [birth year]  Transition of Care [facility]) CM/SW Contact:    Ella Bodo, RN Phone Number: 08/24/2020, 4:43 PM  Clinical Narrative:   Ms. Christian is 85 year old female with a past medical history of insulin-dependent diabetes, CKD stage III, and chronic right hip fracture that presented to [facility] with 3 week history of nausea and vomiting complicated by the development of abdominal pain, dysuria found to have septic shock secondary to emphysematous cystitis and left-sided pyelitis.   PTA, pt needs assistance with all ADLS and is bed bound, per report; she lives at home with her adult children.  PT/OT recommending SNF.  Pt needs IV antibiotics until 08/30/20, and pt/daughter agree with SNF placement, per my conversation with her by phone today.   [facility] with [facility] prior [facility].  Spoke with pt's home health nurse through [facility], Wynonia Musty [phone]); she states that [facility] is contracted with three facilities: [facility], [facility] and [facility].  CSW will initiate FL2 and fax out to contracted facilities.  Will provide bed offers when available.               Expected Discharge Plan: Skilled Nursing Facility Barriers to Discharge: Continued Medical Work up   Patient Goals and CMS Choice   CMS Medicare.gov Compare Post Acute Care list provided to:: Patient Represenative (must comment) (daughter) Choice offered to / list presented to : Adult Children  Expected Discharge Plan and Services Expected Discharge Plan: [place]   Discharge Planning Services: CM Consult Post Acute Care Choice: [place] Living arrangements for the past 2 months: Single Family Home                                      Prior Living Arrangements/Services Living arrangements for the past 2 months: Single Family Home Lives with:: Adult  Children Patient language and need for interpreter reviewed:: Yes Do you feel safe going back to the place where you live?: Yes      Need for Family Participation in Patient Care: Yes (Comment) Care giver support system in place?: Yes (comment) Current home services: Other (comment) ([facility]) Criminal Activity/Legal Involvement Pertinent to Current Situation/Hospitalization: No - Comment as needed                 Emotional Assessment Appearance:: Appears stated age Attitude/Demeanor/Rapport: Unable to Assess Affect (typically observed): Unable to Assess Orientation: : Oriented to Self, Oriented to Place, Oriented to  Time, Oriented to Situation      Admission diagnosis:  Pyelitis [N12] Hip pain [M25.559] Pyelonephritis [N12] Acute cystitis with hematuria [N30.01] Leukocytosis, unspecified type [D72.829] Sepsis, due to unspecified organism, unspecified whether acute organ dysfunction present [facility]) [A41.9] Patient Active Problem List   Diagnosis Date Noted  . Bacteremia due to Klebsiella pneumoniae 08/22/2020  . Abdominal distention   . Acute cystitis with hematuria   . Hip pain   . Leukocytosis   . Pressure injury of skin 08/18/2020  . Sepsis ([place]) 08/17/2020  . Pyelitis 08/17/2020  . Dehydration   . Pain   . Goals of care, counseling/discussion   . Advanced care planning/counseling discussion   . Palliative care by specialist   . Closed right hip fracture, initial encounter ([place]) 03/03/2020  . Diabetes mellitus without complication ([place])   .  CKD (chronic kidney disease), stage III ([place])   . Closed right hip fracture ([place])   . Protein calorie malnutrition ([place])   . Bilateral leg edema   . Intractable pain 03/02/2020   PCP:  Patient, No Pcp Per Pharmacy:   [id number] [place], [address] [place] [address] Phone: [phone] Fax: [phone]     Social Determinants of Health (SDOH)  Interventions    Readmission Risk Interventions No flowsheet data found.  Reinaldo Raddle, RN, BSN  Trauma/Neuro ICU Case Manager [phone]

## 2020-08-24 NOTE — Progress Notes (Signed)
Subjective:  Whitney [name] is 85 year old female with a past medical history of insulin-dependent diabetes, CKD stage III, and chronic right hip fracture that presented to [facility] with3 week history ofnausea and vomiting complicated by the development of abdominal pain, dysuria found to have septic shock secondary to emphysematous cystitis and left-sided pyelitis.  Overnight, patient voided approximately 159mL urine without foley at 645PM.  This morning, patient had a large bowel movement which helped alleviate some of her abdominal discomfort. Daughter translates that patient is doing much better. We discussed the goal of discharging to a skilled nursing facility following hospitalization which daughter agrees is the most appropriate plan of action.  Objective:  Vital signs in last 24 hours: Vitals:   08/24/20 0452 08/24/20 0500 08/24/20 0806 08/24/20 1152  BP:   (!) 138/55 (!) 123/56  Pulse:   87 100  Resp:   13 13  Temp:   97.8 F (36.6 C) 98 F (36.7 C)  TempSrc:   Axillary Axillary  SpO2: 98%  100% 99%  Weight:  87.1 kg    Height:      Patient on room air during our evaluation.  Filed Weights   08/21/20 0500 08/22/20 0500 08/24/20 0500  Weight: 85 kg 88 kg 87.1 kg    Intake/Output Summary (Last 24 hours) at 08/24/2020 1340 Last data filed at 08/24/2020 0900 Gross per 24 hour  Intake 200 ml  Output 350 ml  Net -150 ml   General: Elderly female laying in bed, smiling, NAD,  Cardiovascular: Normal rate, regular rhythm. No murmurs, rubs, or gallops Pulmonary: breath sounds normal. No wheezes, rales, or rhonchi Abdominal: bowel sounds present, umbilical hernia present, mildly tender to palpation in suprapubic region and left abdomen.  CBC Latest Ref Rng & Units 08/24/2020 08/23/2020 08/22/2020  WBC 4.0 - 10.5 K/uL 10.1 13.5(H) 13.8(H)  Hemoglobin 12.0 - 15.0 g/dL 8.5(L) 8.8(L) 9.8(L)  Hematocrit 36 - 46 % 27.0(L) 27.3(L) 31.4(L)  Platelets 150 - 400 K/uL 160 PLATELET  CLUMPS NOTED ON SMEAR, UNABLE TO ESTIMATE 153   CMP Latest Ref Rng & Units 08/24/2020 08/23/2020 08/22/2020  Glucose 70 - 99 mg/dL 165(H) 123(H) 114(H)  BUN 8 - 23 mg/dL 47(H) 46(H) 42(H)  Creatinine 0.44 - 1.00 mg/dL 1.56(H) 1.71(H) 1.69(H)  Sodium 135 - 145 mmol/L 140 139 140  Potassium 3.5 - 5.1 mmol/L 4.9 5.0 4.7  Chloride 98 - 111 mmol/L 111 112(H) 111  CO2 22 - 32 mmol/L 21(L) 22 21(L)  Calcium 8.9 - 10.3 mg/dL 7.6(L) 7.6(L) 7.9(L)  Total Protein 6.5 - 8.1 g/dL 5.6(L) 5.1(L) 5.8(L)  Total Bilirubin 0.3 - 1.2 mg/dL 1.3(H) 0.8 0.8  Alkaline Phos 38 - 126 U/L 236(H) 241(H) 170(H)  AST 15 - 41 U/L 32 36 27  ALT 0 - 44 U/L 15 16 14    DG Abd 1 View  Result Date: 08/22/2020 CLINICAL DATA:  Abdominal distension and pain. EXAM: ABDOMEN - 1 VIEW COMPARISON:  Mar 18, 2020. FINDINGS: The bowel gas pattern is normal. Left ureteral stent is noted in grossly good position. IVC filter is noted. No radio-opaque calculi or other significant radiographic abnormality are seen. IMPRESSION: No evidence of bowel obstruction or ileus. Left ureteral stent is noted in grossly good position. Electronically Signed   By: Marijo Conception M.D.   On: 08/22/2020 17:51   IR Fluoro Guide CV Line Right  Result Date: 08/23/2020 CLINICAL DATA:  Emphysematous cystitis and need for tunneled central venous catheter for long-term IV antibiotic therapy.  EXAM: TUNNELED CENTRAL VENOUS CATHETER PLACEMENT WITH ULTRASOUND AND FLUOROSCOPIC GUIDANCE ANESTHESIA/SEDATION: None MEDICATIONS: None FLUOROSCOPY TIME:  6 seconds.  1.0 mGy. PROCEDURE: The procedure, risks, benefits, and alternatives were explained to the patient. Questions regarding the procedure were encouraged and answered. The patient understands and consents to the procedure. A timeout was performed prior to initiating the procedure. The right neck and chest were prepped with chlorhexidine in a sterile fashion, and a sterile drape was applied covering the operative field.  Maximum barrier sterile technique with sterile gowns and gloves were used for the procedure. Local anesthesia was provided with 1% lidocaine. Ultrasound was used to confirm patency of the right internal jugular vein. After creating a small venotomy incision, a 21 gauge needle was advanced into the right internal jugular vein under direct, real-time ultrasound guidance. Ultrasound image documentation was performed. After securing guidewire access, a 6 French peel-away sheath was placed. A wire was kinked to measure appropriate catheter length. A 6 [place], dual-lumen Power Line tunneled catheter was tunneled in a retrograde fashion from the chest wall to the venotomy incision. The catheter was cut to 20 cm based on guidewire measurement. The catheter was then placed through the sheath and the sheath removed. Final catheter positioning was confirmed and documented with a fluoroscopic spot image. The catheter was aspirated and flushed with saline. The venotomy incision was closed with subcutaneous subcuticular 4-0 Vicryl. Dermabond was applied to the incision. The catheter exit site was secured with 0-Prolene retention sutures. COMPLICATIONS: None.  No pneumothorax. FINDINGS: After catheter placement, the tip lies at the SVC/RA junction. The catheter aspirates normally and is ready for immediate use. IMPRESSION: Placement of tunneled central venous catheter via the right internal jugular vein. The catheter tip lies at the SVC/RA junction. The catheter is ready for immediate use. Electronically Signed   By: Aletta Edouard M.D.   On: 08/23/2020 15:51   IR US Guide Vasc Access Right  Result Date: 08/23/2020 CLINICAL DATA:  Emphysematous cystitis and need for tunneled central venous catheter for long-term IV antibiotic therapy. EXAM: TUNNELED CENTRAL VENOUS CATHETER PLACEMENT WITH ULTRASOUND AND FLUOROSCOPIC GUIDANCE ANESTHESIA/SEDATION: None MEDICATIONS: None FLUOROSCOPY TIME:  6 seconds.  1.0 mGy. PROCEDURE: The  procedure, risks, benefits, and alternatives were explained to the patient. Questions regarding the procedure were encouraged and answered. The patient understands and consents to the procedure. A timeout was performed prior to initiating the procedure. The right neck and chest were prepped with chlorhexidine in a sterile fashion, and a sterile drape was applied covering the operative field. Maximum barrier sterile technique with sterile gowns and gloves were used for the procedure. Local anesthesia was provided with 1% lidocaine. Ultrasound was used to confirm patency of the right internal jugular vein. After creating a small venotomy incision, a 21 gauge needle was advanced into the right internal jugular vein under direct, real-time ultrasound guidance. Ultrasound image documentation was performed. After securing guidewire access, a 6 French peel-away sheath was placed. A wire was kinked to measure appropriate catheter length. A 6 [place], dual-lumen Power Line tunneled catheter was tunneled in a retrograde fashion from the chest wall to the venotomy incision. The catheter was cut to 20 cm based on guidewire measurement. The catheter was then placed through the sheath and the sheath removed. Final catheter positioning was confirmed and documented with a fluoroscopic spot image. The catheter was aspirated and flushed with saline. The venotomy incision was closed with subcutaneous subcuticular 4-0 Vicryl. Dermabond was applied to the incision. The  catheter exit site was secured with 0-Prolene retention sutures. COMPLICATIONS: None.  No pneumothorax. FINDINGS: After catheter placement, the tip lies at the SVC/RA junction. The catheter aspirates normally and is ready for immediate use. IMPRESSION: Placement of tunneled central venous catheter via the right internal jugular vein. The catheter tip lies at the SVC/RA junction. The catheter is ready for immediate use. Electronically Signed   By: Aletta Edouard M.D.   On:  08/23/2020 15:51   VAS [place] UPPER EXTREMITY VENOUS DUPLEX  Result Date: 08/24/2020 UPPER VENOUS STUDY  Indications: Swelling, Edema, and Pain Risk Factors: Surgery Right CV line 08/22/2020. Performing Technologist: Griffin Basil RCT RDMS  Examination Guidelines: A complete evaluation includes B-mode imaging, spectral Doppler, color Doppler, and power Doppler as needed of all accessible portions of each vessel. Bilateral testing is considered an integral part of a complete examination. Limited examinations for reoccurring indications may be performed as noted.  Right Findings: +----------+------------+---------+-----------+----------+-------+ RIGHT     CompressiblePhasicitySpontaneousPropertiesSummary +----------+------------+---------+-----------+----------+-------+ IJV           Full       Yes       Yes                      +----------+------------+---------+-----------+----------+-------+ Subclavian    Full       Yes       Yes                      +----------+------------+---------+-----------+----------+-------+ Axillary      Full       Yes       Yes                      +----------+------------+---------+-----------+----------+-------+ Brachial      Full       Yes       Yes                      +----------+------------+---------+-----------+----------+-------+ Radial        Full                                          +----------+------------+---------+-----------+----------+-------+ Ulnar         Full                                          +----------+------------+---------+-----------+----------+-------+ Cephalic      Full                                          +----------+------------+---------+-----------+----------+-------+ Basilic       Full                                          +----------+------------+---------+-----------+----------+-------+  Summary:  Right: No evidence of deep vein thrombosis in the upper extremity. No  evidence of superficial vein thrombosis in the upper extremity.  Left: No evidence of thrombosis in the subclavian.  *See table(s) above for measurements and observations.    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  #Emphysematous cystitis and left emphysematous pyelitis, active #ESBL-producing Klebsiella bacteremia, active Patient presented with septic shock 2/2 ESBL-producing Klebsiella bacteremia. Found to have emphysematous cystitis and left-sided emphysematous pylelitis. Patient is s/p Left Ureteral Stent placement and Foley Catheter drainage on 08/18/20. Follow-up CT abdomen and pelvis wo contrast completed which revealed decompression of left renal collecting system and no further gas. Dr.Gay, urologist, following peripherally and has recommended voiding trial with initiation of Flomax as well as continuing antibiotics for a prolonged course. Given the need for a prolonged course of antibiotics, we consulted ID for recommendations. ID recommended switching meropenem to ertapenem upon discharge which patient will receive via PICC which was placed on 10/13. -Continue meropenem until discharge, then transition to ertapenem 500mg  IV q24h through 10/20 -Flomax, foley currently out, patient voiding via pure wick -Tylenol for moderate pain -Dilaudid for severe pain -Monitor I/Os -PT/OT continue to eval and treat (recommending SNF or  Supervision /  Assisstance - 24 hour)  #AKI on CKD Stage III, active Patient with a history of CKDIII presented with Creatinine of 2.16, 1.3 at baseline, most recently stable at 1.56. Most likely a component of prerenal and postrenal. Stent placed for obstruction. Patient had foley discontinued on 10/13 with addition of flomax. She has voided small amount of urine since removal. -Continue Flomax -CMP daily  #Hypoalbuminemia Patient with significant hypoalbuminemia (1.2). RD was consulted and recommended Glucerna shake TID and Prosource plus TID for protein supplementation. Patient reports that she hasn't had much of an appetite due to concern of abdominal fullness and constipation. We encouraged the importance of eating and taking protein supplementation to help with her symptoms. As patient's hypoalbuminemia improves, suspect that intravascular volume status will significantly improve. -Continue Glucerna shake TID -Prosource plus TID -CMP daily  #T2DM, chronic A1c 7.8. Patient's blood glucose well controlled on current regimen. -CBG monitoring -Basal: Decrease lantus from 5 units to 4 units QHS -Decrease sliding scale sensitivity to sensitive from moderate -Correction scale QHS  Code status: Full Code VTE ppx: Heparin IVF: none Diet: Dysphagia 1 Bowel Regimen: Senna-docusate and Miralax twice daily  Cato Mulligan, MD 08/21/2020, 6:01 AM Pager: [phone] After 5pm on weekdays and 1pm on weekends: On Call pager [phone]

## 2020-08-24 NOTE — Progress Notes (Signed)
Pharmacy Antibiotic Note  Whitney [name] is a 85 y.o. female admitted on 08/17/2020 with urosepsis.  Blood cultures growing ESBL producing Klebsiella pneumoniae  Pharmacy has been consulted for Meropenem.  SCr has improved to 1.56. WBC has normalized. On D7 of meropenem   Plan: - Continue Meropenem 500 mg IV q12h - Planning to switch to ertapenem on discharge through 10/20   Height: 5\' 3"  (160 cm) Weight: 87.1 kg (192 lb 0.3 oz) IBW/kg (Calculated) : 52.4  Temp (24hrs), Avg:97.6 F (36.4 C), Min:97.3 F (36.3 C), Max:98 F (36.7 C)  Recent Labs  Lab 08/18/20 0135 08/18/20 0500 08/18/20 0535 08/18/20 0846 08/18/20 1112 08/19/20 1330 08/20/20 0350 08/21/20 0446 08/22/20 0348 08/23/20 0638 08/24/20 0310  WBC 29.6*   < >  --   --   --    < > 18.9* 16.9* 13.8* 13.5* 10.1  CREATININE 2.07*  --   --   --   --    < > 1.94* 1.75* 1.69* 1.71* 1.56*  LATICACIDVEN 3.2*  --  2.3* 1.6 2.1*  --   --   --   --   --   --    < > = values in this interval not displayed.    Estimated Creatinine Clearance: 28.6 mL/min (A) (by C-G formula based on SCr of 1.56 mg/dL (H)).    No Known Allergies  Vanc 10/7>>10/8 Cefepime 10/7>>10/8 Merrem 10/8>>  10/7 BCx: BCID ESBL Kleb pneumo.  Switching to Meropenem 10/8 UCx (cystoscopy) >> ESBL K PNA (S-Cipro/Imi/Zosyn) 10/7 UCx: mult species  10/7 Cdiff PCR: sent  Thank you for allowing pharmacy to be a part of this patient's care.  Albertina Parr, PharmD., BCPS, BCCCP Clinical Pharmacist Please refer to [facility] for unit-specific pharmacist

## 2020-08-24 NOTE — Progress Notes (Signed)
Right Upper Ext. study completed.   See CVProc for preliminary results.   Reginald Mangels, RDMS, RVT 

## 2020-08-24 NOTE — Progress Notes (Signed)
Physical Therapy Treatment Patient Details Name: Whitney [name] MRN: [id number] DOB: 1935/03/03 Today's Date: 08/24/2020    History of Present Illness Ms. Jenning is 85 year old female with a past medical history of insulin-dependent diabetes, CKD stage III, and chronic right hip fracture that presented to [facility] with 3 week history of nausea and vomiting complicated by the development of abdominal pain, dysuria found to have septic shock secondary to emphysematous cystitis and left-sided pyelitis.    PT Comments    Pt's ability to participate in PT session remains limited by reports of pain. Pt crying out with minimal adjustment of bed position and reporting significant back pain. PT has seen this patient during a previous admission and back pain has been chronic since at least the previous admission. Pt refuses attempts at LE PROM. Pt has limited awareness of safety precautions, refusing to sit up higher in bed to attempt to drink water per SLP recommendations. PT reducing pt POC to 2x/week as pt continues to report significant pain and has limited activity tolerance. Pt also with significant chronic mobility deficits and requires significant assistance for all mobility and ADLs at home. PT continues to recommend return home with HHPT and 24/7 assistance vs SNF placement.   Follow Up Recommendations  Supervision/Assistance - 24 hour;Home health PT;SNF     Equipment Recommendations   (mechanical lift if one is not already owned)    Recommendations for Other Services       Precautions / Restrictions Precautions Precautions: Fall Precaution Comments: chronic R Hip dislocation Restrictions Weight Bearing Restrictions: No (bedbound at home)    Mobility  Bed Mobility Overal bed mobility: Needs Assistance             General bed mobility comments: pt refuses rolling, PT assists pt in sliding up in bed and repositioning  Transfers                    Ambulation/Gait                  Stairs             Wheelchair Mobility    Modified Rankin (Stroke Patients Only)       Balance                                            Cognition Arousal/Alertness: Awake/alert Behavior During Therapy: Anxious Overall Cognitive Status: No family/caregiver present to determine baseline cognitive functioning                                 General Comments: pt responds appropriately and intermittently follows commands. Pt demonstrates limited awareness of precautions at this time      Exercises      General Comments General comments (skin integrity, edema, etc.): pt in significant pain with repositioning in bed. Pt refuses attempts at LE PROM      Pertinent Vitals/Pain Pain Assessment: Faces Faces Pain Scale: Hurts worst Pain Location: back Pain Descriptors / Indicators: Grimacing;Moaning Pain Intervention(s): Monitored during session    Home Living                      Prior Function            PT Goals (current goals can now be found in  the care plan section) Acute Rehab PT Goals Patient Stated Goal: to help pain Progress towards PT goals: Not progressing toward goals - comment    Frequency    Min 2X/week      PT Plan Frequency needs to be updated    Co-evaluation              AM-PAC PT "6 Clicks" Mobility   Outcome Measure  Help needed turning from your back to your side while in a flat bed without using bedrails?: Total Help needed moving from lying on your back to sitting on the side of a flat bed without using bedrails?: Total Help needed moving to and from a bed to a chair (including a wheelchair)?: Total Help needed standing up from a chair using your arms (e.g., wheelchair or bedside chair)?: Total Help needed to walk in hospital room?: Total Help needed climbing 3-5 steps with a railing? : Total 6 Click Score: 6    End of Session   Activity Tolerance: Patient limited  by pain Patient left: in bed;with bed alarm set Nurse Communication: Mobility status;Need for lift equipment PT Visit Diagnosis: Other abnormalities of gait and mobility (R26.89);Pain Pain - part of body:  (back)     Time: 1610-9604 PT Time Calculation (min) (ACUTE ONLY): 14 min  Charges:  $Therapeutic Activity: 8-22 mins                     Zenaida Niece, PT, DPT Acute Rehabilitation Pager: [phone]    Zenaida Niece 08/24/2020, 3:59 PM

## 2020-08-24 NOTE — NC FL2 (Signed)
[place] LEVEL OF CARE SCREENING TOOL     IDENTIFICATION  Patient Name: Whitney [name] Birthdate: 1934-11-24 Sex: female Admission Date (Current Location): 08/17/2020  [facility] [place] Number:  Herbalist and Address:  [facility] [place] [place] [address], [place], [place] [address]      Provider Number: [id number]  Attending Physician Name and Address:  Aldine Contes, MD  Relative Name and Phone Number:  Caro Hight (Daughter) [phone]    Current Level of Care: Hospital Recommended Level of Care: [place] Prior Approval Number:    Date Approved/Denied:   PASRR Number: [id number]  Discharge Plan: SNF    Current Diagnoses: Patient Active Problem List   Diagnosis Date Noted   Bacteremia due to Klebsiella pneumoniae 08/22/2020   Abdominal distention    Acute cystitis with hematuria    Hip pain    Leukocytosis    Pressure injury of skin 08/18/2020   Sepsis ([name]) 08/17/2020   Pyelitis 08/17/2020   Dehydration    Pain    Goals of care, counseling/discussion    Advanced care planning/counseling discussion    Palliative care by specialist    Closed right hip fracture, initial encounter ([place]) 03/03/2020   Diabetes mellitus without complication ([place])    CKD (chronic kidney disease), stage III ([place])    Closed right hip fracture (H[facility])    Protein calorie malnutrition ([place])    Bilateral leg edema    Intractable pain 03/02/2020    Orientation RESPIRATION BLADDER Height & Weight     Self, Time, Situation, Place  O2 (1.5L nasal cannula) Incontinent, External catheter Weight: 192 lb 0.3 oz (87.1 kg) Height:  5\' 3"  (160 cm)  BEHAVIORAL SYMPTOMS/MOOD NEUROLOGICAL BOWEL NUTRITION STATUS      Incontinent Diet (See DC summary)  AMBULATORY STATUS COMMUNICATION OF NEEDS Skin   Extensive Assist Verbally Other (Comment) (3 stage 2 pressure injurys on right leg)                       Personal  Care Assistance Level of Assistance  Bathing, Feeding, Dressing Bathing Assistance: Maximum assistance Feeding assistance: Maximum assistance Dressing Assistance: Maximum assistance     Functional Limitations Info             SPECIAL CARE FACTORS FREQUENCY  PT (By licensed PT), OT (By licensed OT)     PT Frequency: 5X per week OT Frequency: 5X per week            Contractures      Additional Factors Info  Code Status, Isolation Precautions, Allergies, Insulin Sliding Scale Code Status Info: FULL Allergies Info: NKA   Insulin Sliding Scale Info: See DC summary Isolation Precautions Info: Extended spectrum beta lactamase (ESBL)     Current Medications (08/24/2020):  This is the current hospital active medication list Current Facility-Administered Medications  Medication Dose Route Frequency Provider Last Rate Last Admin   (feeding supplement) PROSource Plus liquid 30 mL  30 mL Oral TID BM Dareen Piano, [place], MD   30 mL at 08/24/20 1217   acetaminophen (TYLENOL) tablet 650 mg  650 mg Oral Q6H PRN [name], MD   650 mg at 08/23/20 0910   Chlorhexidine Gluconate Cloth 2 % PADS 6 each  6 each Topical Daily [name], MD   6 each at 08/24/20 0857   feeding supplement (GLUCERNA SHAKE) (GLUCERNA SHAKE) liquid 237 mL  237 mL Oral TID BM Aldine Contes, MD  237 mL at 08/24/20 0856   heparin injection 5,000 Units  5,000 Units Subcutaneous Q8H [name], MD   5,000 Units at 08/24/20 1217   HYDROmorphone (DILAUDID) injection 0.5 mg  0.5 mg Intravenous Q4H PRN [name], MD   0.5 mg at 08/24/20 0416   insulin aspart (novoLOG) injection 0-5 Units  0-5 Units Subcutaneous QHS Cato Mulligan, MD       insulin aspart (novoLOG) injection 0-9 Units  0-9 Units Subcutaneous TID WC Cato Mulligan, MD   1 Units at 08/24/20 1217   insulin glargine (LANTUS) injection 4 Units  4 Units Subcutaneous QHS Cato Mulligan, MD   4 Units at 08/23/20 2226   lactated ringers  infusion   Intravenous Continuous Roberts Gaudy, MD 800 mL/hr at 08/18/20 2034 Rate Change at 08/18/20 2034   lidocaine (XYLOCAINE) 1 % (with pres) injection    PRN Aletta Edouard, MD   20 mL at 08/23/20 1509   meropenem (MERREM) 500 mg in sodium chloride 0.9 % 100 mL IVPB  500 mg Intravenous BID Aldine Contes, MD 200 mL/hr at 08/24/20 0815 500 mg at 08/24/20 0815   ondansetron (ZOFRAN) tablet 4 mg  4 mg Oral Q6H PRN [name], MD       Or   ondansetron [facility]) injection 4 mg  4 mg Intravenous Q6H PRN [name], MD   4 mg at 08/18/20 0817   polyethylene glycol (MIRALAX / GLYCOLAX) packet 17 g  17 g Oral BID [name], MD   17 g at 08/24/20 1216   senna-docusate (Senokot-S) tablet 1 tablet  1 tablet Oral BID [name], MD   1 tablet at 08/24/20 1217   tamsulosin (FLOMAX) capsule 0.4 mg  0.4 mg Oral Daily [name], MD   0.4 mg at 08/24/20 1217     Discharge Medications: Please see discharge summary for a list of discharge medications.  Relevant Imaging Results:  Relevant Lab Results:   Additional Information SS # [id number]  Glennon Hamilton, Student-Social Work

## 2020-08-25 DIAGNOSIS — E8770 Fluid overload, unspecified: Secondary | ICD-10-CM

## 2020-08-25 LAB — COMPREHENSIVE METABOLIC PANEL
ALT: 13 U/L (ref 0–44)
AST: 29 U/L (ref 15–41)
Albumin: 1.3 g/dL — ABNORMAL LOW (ref 3.5–5.0)
Alkaline Phosphatase: 233 U/L — ABNORMAL HIGH (ref 38–126)
Anion gap: 7 (ref 5–15)
BUN: 45 mg/dL — ABNORMAL HIGH (ref 8–23)
CO2: 22 mmol/L (ref 22–32)
Calcium: 7.7 mg/dL — ABNORMAL LOW (ref 8.9–10.3)
Chloride: 112 mmol/L — ABNORMAL HIGH (ref 98–111)
Creatinine, Ser: 1.49 mg/dL — ABNORMAL HIGH (ref 0.44–1.00)
GFR, Estimated: 32 mL/min — ABNORMAL LOW (ref >60)
Glucose, Bld: 155 mg/dL — ABNORMAL HIGH (ref 70–99)
Potassium: 4.8 mmol/L (ref 3.5–5.1)
Sodium: 141 mmol/L (ref 135–145)
Total Bilirubin: 1.1 mg/dL (ref 0.3–1.2)
Total Protein: 5.7 g/dL — ABNORMAL LOW (ref 6.5–8.1)

## 2020-08-25 LAB — CBC
HCT: 27.1 % — ABNORMAL LOW (ref 36.0–46.0)
Hemoglobin: 8.4 g/dL — ABNORMAL LOW (ref 12.0–15.0)
MCH: 29.1 pg (ref 26.0–34.0)
MCHC: 31 g/dL (ref 30.0–36.0)
MCV: 93.8 fL (ref 80.0–100.0)
Platelets: 167 10*3/uL (ref 150–400)
RBC: 2.89 MIL/uL — ABNORMAL LOW (ref 3.87–5.11)
RDW: 16.3 % — ABNORMAL HIGH (ref 11.5–15.5)
WBC: 8.8 10*3/uL (ref 4.0–10.5)
nRBC: 0 % (ref 0.0–0.2)

## 2020-08-25 LAB — GLUCOSE, CAPILLARY
Glucose-Capillary: 142 mg/dL — ABNORMAL HIGH (ref 70–99)
Glucose-Capillary: 138 mg/dL — ABNORMAL HIGH (ref 70–99)
Glucose-Capillary: 183 mg/dL — ABNORMAL HIGH (ref 70–99)
Glucose-Capillary: 184 mg/dL — ABNORMAL HIGH (ref 70–99)

## 2020-08-25 MED ORDER — SODIUM CHLORIDE 0.9 % IV SOLN
1.0000 g | Freq: Two times a day (BID) | INTRAVENOUS | Status: AC
  Administered 2020-08-25 – 2020-08-30 (×11): 1 g via INTRAVENOUS
  Filled 2020-08-25 (×11): qty 1

## 2020-08-25 MED ORDER — FUROSEMIDE 10 MG/ML IJ SOLN
20.0000 mg | Freq: Once | INTRAMUSCULAR | Status: AC
  Administered 2020-08-25: 20 mg via INTRAVENOUS
  Filled 2020-08-25: qty 2

## 2020-08-25 NOTE — TOC Transition Note (Signed)
Transition of Care [facility]) - CM/SW Discharge Note   Patient Details  Name: Whitney [name] MRN: [id number] Date of Birth: [birth year]  Transition of Care [facility]) CM/SW Contact:  Vinie Sill, [place] Phone Number: 08/25/2020, 2:38 PM   Clinical Narrative:     CSW spoke with patient daughter,Whitney [name]. CSW introduced self and explained role. Patient's daughter is agreeable to SNF. The SNFs contracted with [facility] are:  [facility] (offered bed),[facility](not accepting admissions) and [facility](no availably). However, the only SNF with availability and that has offered is [facility]. Patient's daughter was unhappy and believes she was not provided with a choice. CSW spoke with [facility] SW Doroteo [phone] and advised of conversation with patient's family. SW Fil[place] discuss the concerns with [facility] and they are willing to do 1x contract with a SNF that is willing to accept patient. Per request of [facility], CSW sent out referrals for back up placement.   RN informed CSW patient not medically ready and possible discharge the beginning of next week. CSW explained to patient's daughter, will follow up with [facility](her preferred SNF) next week for availability. She states understanding.  CSW will continue to follow and assist with discharge planning.   Thurmond Butts, MSW, LCSWA Clinical Social Worker   Final next level of care: Skilled Nursing Facility Barriers to Discharge: Continued Medical Work up   Patient Goals and CMS Choice   CMS Medicare.gov Compare Post Acute Care list provided to:: Patient Represenative (must comment) (daughter) Choice offered to / list presented to : Adult Children  Discharge Placement                       Discharge Plan and Services   Discharge Planning Services: CM Consult Post Acute Care Choice: Skilled Nursing Facility                               Social Determinants of Health (SDOH) Interventions     Readmission Risk  Interventions No flowsheet data found.

## 2020-08-25 NOTE — Progress Notes (Signed)
OT Cancellation Note  Patient Details Name: Whitney [name] MRN: [id number] DOB: 02/01/1935   Cancelled Treatment:    Reason Eval/Treat Not Completed: Fatigue/lethargy limiting ability to participate.  Pt sleeping soundly.  Nilsa Nutting., OTR/L Acute Rehabilitation Services Pager [phone] Office [phone]   Lucille Passy M 08/25/2020, 4:07 PM

## 2020-08-25 NOTE — Progress Notes (Signed)
Pharmacy Antibiotic Note  Whitney [name] is a 85 y.o. female admitted on 08/17/2020 with urosepsis.  Blood cultures growing ESBL producing Klebsiella pneumoniae  Pharmacy has been consulted for Meropenem.  SCr has improved to 1.49. WBC has normalized. On D8 of meropenem   Plan: - Increase Meropenem to 1000 mg IV q12h - Planning to switch to ertapenem on discharge through 10/20   Height: 5\' 3"  (160 cm) Weight: 86.1 kg (189 lb 13.1 oz) IBW/kg (Calculated) : 52.4  Temp (24hrs), Avg:98.1 F (36.7 C), Min:97.6 F (36.4 C), Max:98.3 F (36.8 C)  Recent Labs  Lab 08/21/20 0446 08/22/20 0348 08/23/20 0638 08/24/20 0310 08/25/20 0500  WBC 16.9* 13.8* 13.5* 10.1 8.8  CREATININE 1.75* 1.69* 1.71* 1.56* 1.49*    Estimated Creatinine Clearance: 29.8 mL/min (A) (by C-G formula based on SCr of 1.49 mg/dL (H)).    No Known Allergies  Vanc 10/7>>10/8 Cefepime 10/7>>10/8 Merrem 10/8>>  10/7 BCx: BCID ESBL Kleb pneumo.  Switching to Meropenem 10/8 UCx (cystoscopy) >> ESBL K PNA (S-Cipro/Imi/Zosyn) 10/7 UCx: mult species  10/7 Cdiff PCR: sent  Thank you for allowing pharmacy to be a part of this patient's care.  Albertina Parr, PharmD., BCPS, BCCCP Clinical Pharmacist Please refer to [facility] for unit-specific pharmacist

## 2020-08-25 NOTE — Progress Notes (Signed)
Pt given iv furosemide 20mg  at approximately 1330. 1430 this nurse noted pt had not voided. Bladder scan done and was noted to have 35 ml of urine in bladder. Provider contacted. No new orders obtained. Katherina Right RN

## 2020-08-25 NOTE — TOC Progression Note (Signed)
Transition of Care [facility]) - Progression Note    Patient Details  Name: Whitney [name] MRN: [id number] Date of Birth: 08/05/1935  Transition of Care [facility]) CM/SW [place], [place] Phone Number: 08/25/2020, 9:43 AM  Clinical Narrative:     Patient has not bed offers at this time. [place]- informed patient is with [facility] and requested them to review. CSW waiting on response.  Thurmond Butts, MSW, [place] Clinical Social Worker   Expected Discharge Plan: Skilled Nursing Facility Barriers to Discharge: Continued Medical Work up  Expected Discharge Plan and Services Expected Discharge Plan: [facility]   Discharge Planning Services: CM Consult Post Acute Care Choice: [place] Living arrangements for the past 2 months: Single Family Home                                       Social Determinants of Health (SDOH) Interventions    Readmission Risk Interventions No flowsheet data found.

## 2020-08-25 NOTE — Progress Notes (Signed)
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  Overnight, no acute events.  This morning, she denies abdominal pain. She states that she is hungry and requesting to eat breakfast this morning. She has no further complaints.  Objective:  Vital signs in last 24 hours: Vitals:   08/24/20 2330 08/25/20 0430 08/25/20 0700 08/25/20 0741  BP: 126/67 (!) 127/57  (!) 127/55  Pulse: 87 85  95  Resp: 18 18  12   Temp: 98.2 F (36.8 C) 98.3 F (36.8 C)  97.6 F (36.4 C)  TempSrc: Oral Oral  Axillary  SpO2: 100% 100%  100%  Weight:   86.1 kg   Height:      SpO2: 100 % O2 Flow Rate (L/min): 1 L/min - turned down to room air during our evaluation  Filed Weights   08/22/20 0500 08/24/20 0500 08/25/20 0700  Weight: 88 kg 87.1 kg 86.1 kg    Intake/Output Summary (Last 24 hours) at 08/25/2020 1114 Last data filed at 08/25/2020 0530 Gross per 24 hour  Intake 375 ml  Output 750 ml  Net -375 ml   Physical Exam Constitutional:      General: She is not in acute distress. HENT:     Head: Normocephalic and atraumatic.  Eyes:     Extraocular Movements: Extraocular movements intact.     Conjunctiva/sclera: Conjunctivae normal.  Cardiovascular:     Rate and Rhythm: Normal rate and regular rhythm.     Pulses: Normal pulses.     Heart sounds: Normal heart sounds.  Pulmonary:     Effort: Pulmonary effort is normal. No respiratory distress.     Breath sounds: Normal breath sounds.  Abdominal:     General: Abdomen is flat. Bowel sounds are normal.     Palpations: Abdomen is soft.     Tenderness: There is no abdominal tenderness.     Comments:  Umbilical hernia present  Musculoskeletal:     Cervical back: Normal range of motion and neck supple.     Right lower leg: Edema present.     Left lower leg: Edema present.     Comments: 2+ pitting edema of bilateral lower extremities  Skin:    General: Skin is warm and dry.     Capillary Refill: Capillary refill takes less than 2 seconds.  Neurological:     General: No focal deficit present.     Mental Status: She is alert. Mental status is at baseline.  Psychiatric:        Mood and Affect: Mood normal.        Behavior: Behavior normal.        Thought Content: Thought content normal.        Judgment: Judgment normal.    CBC Latest Ref Rng & Units 08/25/2020 08/24/2020 08/23/2020  WBC 4.0 - 10.5 K/uL 8.8 10.1 13.5(H)  Hemoglobin 12.0 - 15.0 g/dL 8.4(L) 8.5(L) 8.8(L)  Hematocrit 36 - 46 % 27.1(L) 27.0(L) 27.3(L)  Platelets 150 - 400 K/uL 167 160 PLATELET CLUMPS NOTED ON SMEAR, UNABLE TO ESTIMATE   CMP Latest Ref Rng & Units 08/25/2020 08/24/2020 08/23/2020  Glucose 70 - 99 mg/dL 155(H) 165(H) 123(H)  BUN 8 - 23 mg/dL 45(H) 47(H) 46(H)  Creatinine 0.44 - 1.00 mg/dL 1.49(H) 1.56(H) 1.71(H)  Sodium 135 - 145 mmol/L 141 140 139  Potassium 3.5 - 5.1 mmol/L 4.8 4.9 5.0  Chloride 98 - 111 mmol/L 112(H) 111 112(H)  CO2 22 - 32 mmol/L 22 21(L) 22  Calcium 8.9 - 10.3 mg/dL 7.7(L) 7.6(L) 7.6(L)  Total Protein 6.5 - 8.1 g/dL 5.7(L) 5.6(L) 5.1(L)  Total Bilirubin 0.3 - 1.2 mg/dL 1.1 1.3(H) 0.8  Alkaline Phos 38 - 126 U/L 233(H) 236(H) 241(H)  AST 15 - 41 U/L 29 32 36  ALT 0 - 44 U/L 13 15 16    10/15: Glucose 142  IMAGING: IR Fluoro Guide CV Line Right  Result Date: 08/23/2020 CLINICAL DATA:  Emphysematous cystitis and need for tunneled central venous catheter for long-term IV antibiotic therapy. EXAM: TUNNELED CENTRAL VENOUS CATHETER PLACEMENT WITH ULTRASOUND AND FLUOROSCOPIC GUIDANCE ANESTHESIA/SEDATION: None MEDICATIONS: None FLUOROSCOPY TIME:  6 seconds.  1.0 mGy. PROCEDURE: The  procedure, risks, benefits, and alternatives were explained to the patient. Questions regarding the procedure were encouraged and answered. The patient understands and consents to the procedure. A timeout was performed prior to initiating the procedure. The right neck and chest were prepped with chlorhexidine in a sterile fashion, and a sterile drape was applied covering the operative field. Maximum barrier sterile technique with sterile gowns and gloves were used for the procedure. Local anesthesia was provided with 1% lidocaine. Ultrasound was used to confirm patency of the right internal jugular vein. After creating a small venotomy incision, a 21 gauge needle was advanced into the right internal jugular vein under direct, real-time ultrasound guidance. Ultrasound image documentation was performed. After securing guidewire access, a 6 French peel-away sheath was placed. A wire was kinked to measure appropriate catheter length. A 6 [place], dual-lumen Power Line tunneled catheter was tunneled in a retrograde fashion from the chest wall to the venotomy incision. The catheter was cut to 20 cm based on guidewire measurement. The catheter was then placed through the sheath and the sheath removed. Final catheter positioning was confirmed and documented with a fluoroscopic spot image. The catheter was aspirated and flushed with saline. The venotomy incision was closed with subcutaneous subcuticular 4-0 Vicryl. Dermabond was applied to the incision. The catheter exit site was secured with 0-Prolene retention sutures. COMPLICATIONS: None.  No pneumothorax. FINDINGS: After catheter placement, the tip lies at the SVC/RA junction. The catheter aspirates normally and is ready for immediate use. IMPRESSION: Placement of tunneled central venous catheter via the right internal jugular vein. The catheter tip lies at the SVC/RA junction. The catheter is ready for immediate use. Electronically Signed   By: Aletta Edouard M.D.   On:  08/23/2020 15:51   IR US Guide Vasc Access Right  Result Date: 08/23/2020 CLINICAL DATA:  Emphysematous cystitis and need for tunneled central venous catheter for long-term IV antibiotic therapy. EXAM: TUNNELED CENTRAL VENOUS CATHETER PLACEMENT WITH ULTRASOUND AND FLUOROSCOPIC GUIDANCE ANESTHESIA/SEDATION: None MEDICATIONS: None FLUOROSCOPY TIME:  6 seconds.  1.0 mGy. PROCEDURE: The procedure, risks, benefits, and alternatives were explained to the patient. Questions regarding the procedure were encouraged and answered. The patient understands and consents to the procedure. A timeout was performed prior to initiating the procedure. The right neck and chest were prepped with chlorhexidine in a sterile fashion, and a sterile drape was applied covering the operative field. Maximum barrier sterile technique with sterile gowns and gloves were used for the procedure. Local anesthesia was provided with 1% lidocaine. Ultrasound was used to confirm patency of  the right internal jugular vein. After creating a small venotomy incision, a 21 gauge needle was advanced into the right internal jugular vein under direct, real-time ultrasound guidance. Ultrasound image documentation was performed. After securing guidewire access, a 6 French peel-away sheath was placed. A wire was kinked to measure appropriate catheter length. A 6 [place], dual-lumen Power Line tunneled catheter was tunneled in a retrograde fashion from the chest wall to the venotomy incision. The catheter was cut to 20 cm based on guidewire measurement. The catheter was then placed through the sheath and the sheath removed. Final catheter positioning was confirmed and documented with a fluoroscopic spot image. The catheter was aspirated and flushed with saline. The venotomy incision was closed with subcutaneous subcuticular 4-0 Vicryl. Dermabond was applied to the incision. The catheter exit site was secured with 0-Prolene retention sutures. COMPLICATIONS: None.   No pneumothorax. FINDINGS: After catheter placement, the tip lies at the SVC/RA junction. The catheter aspirates normally and is ready for immediate use. IMPRESSION: Placement of tunneled central venous catheter via the right internal jugular vein. The catheter tip lies at the SVC/RA junction. The catheter is ready for immediate use. Electronically Signed   By: Aletta Edouard M.D.   On: 08/23/2020 15:51   VAS [place] UPPER EXTREMITY VENOUS DUPLEX  Result Date: 08/24/2020 UPPER VENOUS STUDY  Indications: Swelling, Edema, and Pain Risk Factors: Surgery Right CV line 08/22/2020. Performing Technologist: Griffin Basil RCT RDMS  Examination Guidelines: A complete evaluation includes B-mode imaging, spectral Doppler, color Doppler, and power Doppler as needed of all accessible portions of each vessel. Bilateral testing is considered an integral part of a complete examination. Limited examinations for reoccurring indications may be performed as noted.  Right Findings: +----------+------------+---------+-----------+----------+-------+ RIGHT     CompressiblePhasicitySpontaneousPropertiesSummary +----------+------------+---------+-----------+----------+-------+ IJV           Full       Yes       Yes                      +----------+------------+---------+-----------+----------+-------+ Subclavian    Full       Yes       Yes                      +----------+------------+---------+-----------+----------+-------+ Axillary      Full       Yes       Yes                      +----------+------------+---------+-----------+----------+-------+ Brachial      Full       Yes       Yes                      +----------+------------+---------+-----------+----------+-------+ Radial        Full                                          +----------+------------+---------+-----------+----------+-------+ Ulnar         Full                                           +----------+------------+---------+-----------+----------+-------+ Cephalic      Full                                          +----------+------------+---------+-----------+----------+-------+  Basilic       Full                                          +----------+------------+---------+-----------+----------+-------+  Summary:  Right: No evidence of deep vein thrombosis in the upper extremity. No evidence of superficial vein thrombosis in the upper extremity.  Left: No evidence of thrombosis in the subclavian.  *See table(s) above for measurements and observations.  Diagnosing physician: Servando Snare MD Electronically signed by Servando Snare MD on 08/24/2020 at 2:10:39 PM.    Final    Assessment/Plan:  Principal Problem:   Pyelitis Active Problems:   Sepsis ([place])   Pressure injury of skin   Bacteremia due to Klebsiella pneumoniae   Abdominal distention   Acute cystitis with hematuria   Hip pain   Leukocytosis  Whitney [name] is 85 year old female with a past medical history of insulin-dependent diabetes, CKD stage III, chronic right hip fracture and bed-bound that presented to [facility] on 10/07 withthree-week history ofnausea and vomiting complicated by the development of abdominal pain, dysuria found to have 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  #Emphysematous cystitis and left emphysematous pyelitis, active, resolving #ESBL-producing Klebsiella bacteremia, active, resolving Patient presented on 10/07 with complaints of nausea, vomiting, abdominal pain and dysuria found to have septic shock with emphysematous cystitis and emphysematous left-sided pyelitis in the setting of ESBL-producing Klebsiella bacteremia. Patient was started on broad spectrum IV antibiotics with meropenem. Urology was consulted, and Dr. Abner Greenspan performed left ureteral stent placement, foley catheter placement, and decompression of left renal collecting system on 10/08.  Follow-up CT abdomen and pelvis wo contrast revealed significant improvement. Following procedure, urology recommended a voiding trial with initiation of Flomax as well as continuation of antibiotics for a prolonged course. Given the need for a prolonged course of antibiotics, we consulted ID for recommendations who  meropenem to ertapenem upon discharge which patient will receive via PICC which was placed on 10/13. Patient reports gradual improvement in abdominal pain over the past several days. -Continue meropenem until discharge, then transition to ertapenem 500mg  IV q24h through 10/20  -Patient has follow-up appointment with ID on 10/21 with desire to evaluate patient prior to removing PICC -Tylenol for moderate pain -Dilaudid for severe pain -Monitor I/Os -PT/OT continue to eval and treat (recommending SNF or  Supervision / Assisstance - 24 hour)  #AKI on CKD Stage III, active Patient with a history of CKD3 presented with Creatinine of 2.16, 1.3 at baseline, most recently stable at 1.49, continuing to improve daily. AKI likely has component of prerenal and postrenal. Stent placed for obstruction. Patient had foley discontinued on 10/13 with addition of flomax and is voiding well at this time. -Continue Flomax -CMP daily  #Hypoalbuminemia, active #Volume overload, active Patient with significant hypoalbuminemia (1.2). RD consulted and recommended Glucerna shake TID and Prosource plus TID for protein supplementation. Patient's appetite is improving with improvement in pain, recent bowel movements, and urinating independently. As patient's hypoalbuminemia improves, suspect that intravascular volume status will significantly improve. She may benefit from gentle diuresis today. -Continue Glucerna shake TID -Prosource plus TID -Assist with feeding patient -Lasix 20mg  once, reassess need for repeat doses -CMP daily  #T2DM, chronic A1c 7.8. Patient's blood glucose well controlled on current  regimen while hospitalized. -CBG monitoring -Basal: Continue 4 units lantus QHS -Continue sensitive sliding scale -Correction scale QHS  Code  status: Full Code VTE ppx: Heparin IVF: none Diet: Dysphagia 1 Bowel Regimen: Senna-docusate and Miralax twice daily  Cato Mulligan, MD 08/21/2020, 6:01 AM Pager: [phone] After 5pm on weekdays and 1pm on weekends: On Call pager [phone]

## 2020-08-26 LAB — CBC
HCT: 27.6 % — ABNORMAL LOW (ref 36.0–46.0)
Hemoglobin: 8.2 g/dL — ABNORMAL LOW (ref 12.0–15.0)
MCH: 29 pg (ref 26.0–34.0)
MCHC: 29.7 g/dL — ABNORMAL LOW (ref 30.0–36.0)
MCV: 97.5 fL (ref 80.0–100.0)
Platelets: 120 10*3/uL — ABNORMAL LOW (ref 150–400)
RBC: 2.83 MIL/uL — ABNORMAL LOW (ref 3.87–5.11)
RDW: 17.1 % — ABNORMAL HIGH (ref 11.5–15.5)
WBC: 7.7 10*3/uL (ref 4.0–10.5)
nRBC: 0 % (ref 0.0–0.2)

## 2020-08-26 LAB — GLUCOSE, CAPILLARY
Glucose-Capillary: 122 mg/dL — ABNORMAL HIGH (ref 70–99)
Glucose-Capillary: 217 mg/dL — ABNORMAL HIGH (ref 70–99)
Glucose-Capillary: 225 mg/dL — ABNORMAL HIGH (ref 70–99)

## 2020-08-26 NOTE — Progress Notes (Addendum)
Subjective:  No acute events overnight.  Spoke with patient with daughter on phone. Patient reportedly feeling better today.  Still with minimal UOP despite lasix challenge yesterday. Will continue to encourage PO intake and monitor UOP.  All questions and concerns addressed.  Objective:  Vital signs in last 24 hours: Vitals:   08/25/20 2015 08/26/20 0326 08/26/20 0742 08/26/20 1146  BP: 107/88 (!) 113/51 (!) 111/47 130/65  Pulse: 93 85 85 82  Resp: 15 19 15 18   Temp: 98.1 F (36.7 C) 98.2 F (36.8 C) 97.6 F (36.4 C) (!) 97.5 F (36.4 C)  TempSrc: Axillary Axillary Axillary Axillary  SpO2: 100% 100% 99% 99%  Weight:      Height:      SpO2: 99 % O2 Flow Rate (L/min): 1 L/min - turned down to room air during our evaluation  Filed Weights   08/22/20 0500 08/24/20 0500 08/25/20 0700  Weight: 88 kg 87.1 kg 86.1 kg    Intake/Output Summary (Last 24 hours) at 08/26/2020 1442 Last data filed at 08/26/2020 0700 Gross per 24 hour  Intake --  Output 700 ml  Net -700 ml   Physical Exam Constitutional:      General: She is not in acute distress. HENT:     Head: Normocephalic and atraumatic.  Eyes:     Extraocular Movements: Extraocular movements intact.     Conjunctiva/sclera: Conjunctivae normal.  Cardiovascular:     Rate and Rhythm: Normal rate and regular rhythm.     Pulses: Normal pulses.     Heart sounds: Normal heart sounds.  Pulmonary:     Effort: Pulmonary effort is normal. No respiratory distress.     Breath sounds: Normal breath sounds.  Abdominal:     General: Abdomen is flat. Bowel sounds are normal.     Palpations: Abdomen is soft.     Tenderness: There is no abdominal tenderness.     Comments: Umbilical hernia present  Musculoskeletal:     Cervical back: Normal range of motion and neck supple.     Right lower leg: Edema present.     Left lower leg: Edema present.     Comments: 2+ pitting edema of bilateral lower extremities  Skin:    General:  Skin is warm and dry.     Capillary Refill: Capillary refill takes less than 2 seconds.  Neurological:     General: No focal deficit present.     Mental Status: She is alert. Mental status is at baseline.  Psychiatric:        Mood and Affect: Mood normal.        Behavior: Behavior normal.        Thought Content: Thought content normal.        Judgment: Judgment normal.    CBC Latest Ref Rng & Units 08/26/2020 08/25/2020 08/24/2020  WBC 4.0 - 10.5 K/uL 7.7 8.8 10.1  Hemoglobin 12.0 - 15.0 g/dL 8.2(L) 8.4(L) 8.5(L)  Hematocrit 36 - 46 % 27.6(L) 27.1(L) 27.0(L)  Platelets 150 - 400 K/uL 120(L) 167 160   CMP Latest Ref Rng & Units 08/25/2020 08/24/2020 08/23/2020  Glucose 70 - 99 mg/dL 155(H) 165(H) 123(H)  BUN 8 - 23 mg/dL 45(H) 47(H) 46(H)  Creatinine 0.44 - 1.00 mg/dL 1.49(H) 1.56(H) 1.71(H)  Sodium 135 - 145 mmol/L 141 140 139  Potassium 3.5 - 5.1 mmol/L 4.8 4.9 5.0  Chloride 98 - 111 mmol/L 112(H) 111 112(H)  CO2 22 - 32 mmol/L 22 21(L) 22  Calcium 8.9 -  10.3 mg/dL 7.7(L) 7.6(L) 7.6(L)  Total Protein 6.5 - 8.1 g/dL 5.7(L) 5.6(L) 5.1(L)  Total Bilirubin 0.3 - 1.2 mg/dL 1.1 1.3(H) 0.8  Alkaline Phos 38 - 126 U/L 233(H) 236(H) 241(H)  AST 15 - 41 U/L 29 32 36  ALT 0 - 44 U/L 13 15 16     Assessment/Plan:  Principal Problem:   Pyelitis Active Problems:   Sepsis (HCC)   Pressure injury of skin   Bacteremia due to Klebsiella pneumoniae   Abdominal distention   Acute cystitis with hematuria   Hip pain   Leukocytosis  Whitney [name] is 85 year old female with a past medical history of insulin-dependent diabetes, CKD stage III, chronic right hip fracture and bed-bound that presented to [facility] on 10/07 with three-week history of nausea and vomiting complicated by the development of abdominal pain, dysuria found to have 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  #Emphysematous cystitis and left emphysematous pyelitis, active,  resolving #ESBL-producing Klebsiella bacteremia, active, resolving Patient was started on broad spectrum IV antibiotics with meropenem. Urology was consulted, and Dr. Abner Greenspan performed left ureteral stent placement, foley catheter placement, and decompression of left renal collecting system on 10/08. Follow-up CT abdomen and pelvis wo contrast revealed significant improvement. Following procedure, urology recommended a voiding trial with initiation of Flomax as well as continuation of antibiotics for a prolonged course. Given the need for a prolonged course of antibiotics, we consulted ID who recommended meropenem to ertapenem upon discharge which patient will receive via PICC which was placed on 10/13. Patient reports gradual improvement in abdominal pain over the past several days. -Continue meropenem until discharge, then transition to ertapenem 500mg  IV q24h through 10/20, as per ID  -Patient has follow-up appointment with ID on 10/21 with desire to evaluate patient prior to removing PICC -Tylenol for moderate pain -Dilaudid for severe pain -Monitor I/Os -PT/OT continue to eval and treat (recommending SNF or  Supervision / Assisstance - 24 hour)  #AKI on CKD Stage III, active Patient with a history of CKD3 presented with Creatinine of 2.16, 1.3 at baseline, most recently stable at 1.49, continuing to improve daily. AKI likely has component of prerenal and postrenal. Stent placed for obstruction. Patient had foley discontinued on 10/13 with addition of flomax. Producing minimal UOP despite lasix challenge on 10/15. Bladder scan following lasix challenge showing only 32mL. Will continue to closely monitor UOP and encourage PO intake. -Continue Flomax -CMP daily  #Hypoalbuminemia, active #Volume overload, active Patient with significant hypoalbuminemia (1.2). RD consulted and recommended Glucerna shake TID and Prosource plus TID for protein supplementation. Patient's appetite is improving with  improvement in pain, recent bowel movements, and urinating independently (although with minimal UOP). As patient's hypoalbuminemia improves, suspect that intravascular volume status will significantly improve. -Continue Glucerna shake TID -Prosource plus TID -Assist with feeding patient -CMP daily  #T2DM, chronic A1c 7.8. Patient's blood glucose well controlled on current regimen while hospitalized. -CBG monitoring -Basal: Continue 4 units lantus QHS -Continue sensitive sliding scale -Correction scale QHS  Code status: Full Code VTE ppx: Heparin IVF: none Diet: Dysphagia 1 Bowel Regimen: Senna-docusate and Miralax twice daily   Virl Axe, MD 08/26/2020, 2:42 PM Pager: [phone] After 5pm on weekdays and 1pm on weekends: On Call pager [phone]

## 2020-08-27 ENCOUNTER — Inpatient Hospital Stay (HOSPITAL_COMMUNITY): Payer: 59

## 2020-08-27 LAB — CBC
HCT: 27.3 % — ABNORMAL LOW (ref 36.0–46.0)
Hemoglobin: 8.6 g/dL — ABNORMAL LOW (ref 12.0–15.0)
MCH: 29.3 pg (ref 26.0–34.0)
MCHC: 31.5 g/dL (ref 30.0–36.0)
MCV: 92.9 fL (ref 80.0–100.0)
Platelets: 143 10*3/uL — ABNORMAL LOW (ref 150–400)
RBC: 2.94 MIL/uL — ABNORMAL LOW (ref 3.87–5.11)
RDW: 17.3 % — ABNORMAL HIGH (ref 11.5–15.5)
WBC: 8.6 10*3/uL (ref 4.0–10.5)
nRBC: 0 % (ref 0.0–0.2)

## 2020-08-27 LAB — BASIC METABOLIC PANEL
Anion gap: 5 (ref 5–15)
BUN: 39 mg/dL — ABNORMAL HIGH (ref 8–23)
CO2: 23 mmol/L (ref 22–32)
Calcium: 7.6 mg/dL — ABNORMAL LOW (ref 8.9–10.3)
Chloride: 112 mmol/L — ABNORMAL HIGH (ref 98–111)
Creatinine, Ser: 1.29 mg/dL — ABNORMAL HIGH (ref 0.44–1.00)
GFR, Estimated: 38 mL/min — ABNORMAL LOW (ref >60)
Glucose, Bld: 179 mg/dL — ABNORMAL HIGH (ref 70–99)
Potassium: 5.1 mmol/L (ref 3.5–5.1)
Sodium: 140 mmol/L (ref 135–145)

## 2020-08-27 LAB — GLUCOSE, CAPILLARY
Glucose-Capillary: 113 mg/dL — ABNORMAL HIGH (ref 70–99)
Glucose-Capillary: 142 mg/dL — ABNORMAL HIGH (ref 70–99)
Glucose-Capillary: 194 mg/dL — ABNORMAL HIGH (ref 70–99)
Glucose-Capillary: 137 mg/dL — ABNORMAL HIGH (ref 70–99)
Glucose-Capillary: 165 mg/dL — ABNORMAL HIGH (ref 70–99)

## 2020-08-27 NOTE — Progress Notes (Signed)
Subjective:  Patient is daughter used as Optometrist. She was complaining of rectal pain today and RN helped turn patient. Appears pure wick was slid down and patient just had a large bowel movement. Nursing technician on the way to help RN clean patient.After patient was cleaned the RN called and reported she found a skin tear. She is also having pain in her left hip and her daughter communicates it is getting worse. We will obtain images. RN reports pure wick is not always working and outs recorded may not be 100% accurate. Patient has needed sheets changed multiple times this shift and has some urine in canister not recorded. No other concerns from patient or daughter.   Objective:  Vital signs in last 24 hours: Vitals:   08/26/20 1146 08/26/20 1523 08/26/20 1958 08/26/20 2350  BP: 130/65 (!) 113/49 (!) 115/98 (!) 115/47  Pulse: 82 93    Resp: 18 19 20 18   Temp: (!) 97.5 F (36.4 C) 97.6 F (36.4 C) 98.3 F (36.8 C) 98.2 F (36.8 C)  TempSrc: Axillary Axillary Axillary Axillary  SpO2: 99% 92%    Weight:      Height:      SpO2: 92 % O2 Flow Rate (L/min): 1 L/min - turned down to room air during our evaluation   General: Chronically ill-appearing female lying in bed Cardiovascular: Normal rate, regular rhythm.  No murmurs, rubs, or gallops Pulmonary : Effort normal, breath sounds normal. No wheezes, rales, or rhonchi Abdominal: soft, nontender,  bowel sounds present Musculoskeletal: Bilateral 2+ pitting edema, edema in right arm resolved    Filed Weights   08/22/20 0500 08/24/20 0500 08/25/20 0700  Weight: 88 kg 87.1 kg 86.1 kg    Intake/Output Summary (Last 24 hours) at 08/27/2020 0611 Last data filed at 08/26/2020 0800 Gross per 24 hour  Intake 240 ml  Output 400 ml  Net -160 ml    CBC Latest Ref Rng & Units 08/26/2020 08/25/2020 08/24/2020  WBC 4.0 - 10.5 K/uL 7.7 8.8 10.1  Hemoglobin 12.0 - 15.0 g/dL 8.2(L) 8.4(L) 8.5(L)  Hematocrit 36 - 46 % 27.6(L) 27.1(L)  27.0(L)  Platelets 150 - 400 K/uL 120(L) 167 160   CMP Latest Ref Rng & Units 08/25/2020 08/24/2020 08/23/2020  Glucose 70 - 99 mg/dL 155(H) 165(H) 123(H)  BUN 8 - 23 mg/dL 45(H) 47(H) 46(H)  Creatinine 0.44 - 1.00 mg/dL 1.49(H) 1.56(H) 1.71(H)  Sodium 135 - 145 mmol/L 141 140 139  Potassium 3.5 - 5.1 mmol/L 4.8 4.9 5.0  Chloride 98 - 111 mmol/L 112(H) 111 112(H)  CO2 22 - 32 mmol/L 22 21(L) 22  Calcium 8.9 - 10.3 mg/dL 7.7(L) 7.6(L) 7.6(L)  Total Protein 6.5 - 8.1 g/dL 5.7(L) 5.6(L) 5.1(L)  Total Bilirubin 0.3 - 1.2 mg/dL 1.1 1.3(H) 0.8  Alkaline Phos 38 - 126 U/L 233(H) 236(H) 241(H)  AST 15 - 41 U/L 29 32 36  ALT 0 - 44 U/L 13 15 16     Assessment/Plan:  Principal Problem:   Pyelitis Active Problems:   Sepsis (HCC)   Pressure injury of skin   Bacteremia due to Klebsiella pneumoniae   Abdominal distention   Acute cystitis with hematuria   Hip pain   Leukocytosis  Whitney [name] is 85 year old female with a past medical history of insulin-dependent diabetes, CKD stage III, chronic right hip fracture and bed-bound that presented to [facility] on 10/07 with three-week history of nausea and vomiting complicated by the development of abdominal pain, dysuria found to have septic  shock secondary to emphysematous cystitis and left-sided emphysematous pyelitis.  #Emphysematous cystitis and left emphysematous pyelitis, active, resolving #ESBL-producing Klebsiella bacteremia, active, resolving Patient was started on broad spectrum IV antibiotics with meropenem. Urology was consulted, and Dr. Abner Greenspan performed left ureteral stent placement, foley catheter placement, and decompression of left renal collecting system on 10/08. Follow-up CT abdomen and pelvis wo contrast revealed significant improvement. Following procedure, urology recommended a voiding trial with initiation of Flomax as well as continuation of antibiotics for a prolonged course. Given the need for a prolonged course of antibiotics, we  consulted ID who recommended meropenem to ertapenem upon discharge which patient will receive via PICC which was placed on 10/13. Patient reports gradual improvement in abdominal pain over the past several days. -Continue meropenem until discharge, then transition to ertapenem 500mg  IV q24h through 10/20, as per ID  -Patient has follow-up appointment with ID on 10/21 with desire to evaluate patient prior to removing PICC -Tylenol for moderate pain -Dilaudid for severe pain -Monitor I/Os -PT/OT continue to eval and treat (recommending SNF or  Supervision / Assisstance - 24 hour)  #AKI on CKD Stage III, resolved Patient with a history of CKD3 presented with Creatinine of 2.16, 1.3 at baseline, most recently stable at 1.29. AKI likely has component of prerenal and postrenal. Stent placed for obstruction. Patient had foley discontinued on 10/13 with addition of flomax. Producing minimal UOP despite lasix challenge on 10/15. Bladder scan following lasix challenge showing only 31mL. Will continue to closely monitor UOP and encourage PO intake. All urine output is not being collected via pure wick. -Continue Flomax -CMP daily  #Hypoalbuminemia, active #Volume overload, active Patient with significant hypoalbuminemia (1.2). RD consulted and recommended Glucerna shake TID and Prosource plus TID for protein supplementation. Patient's appetite is improving with improvement in pain, recent bowel movements, and urinating independently.  As patient's hypoalbuminemia improves, suspect that intravascular volume status will significantly improve. Given patient has lower extremity edema and cannot tolerate keeping legs elevated, RN will place patient in boots to help with elevation today. -Continue Glucerna shake TID -Prosource plus TID -Assist with feeding patient -CMP daily  #T2DM, chronic A1c 7.8. Patient's blood glucose well controlled on current regimen while hospitalized. -CBG monitoring -Basal: Continue  4 units lantus QHS -Continue sensitive sliding scale -Correction scale QHS  #Left hip pain Obtained x-rays of hips and pelvis today, no acute changes noted. Patient has had a left hip replacement and has a chronic right femoral neck fracture. Communication is difficult given patient's speaks [place], but patient also found to have a skin tear today. We will continue to treat chronic hip pain and RN consulting wound care for help with skin tear.  Code status: Full Code VTE ppx: Heparin IVF: none Diet: Dysphagia 1 Bowel Regimen: Senna-docusate and Miralax twice daily   [name], MD 08/27/2020, 6:11 AM Pager: [phone] After 5pm on weekdays and 1pm on weekends: On Call pager [phone]

## 2020-08-28 LAB — CBC
HCT: 26.2 % — ABNORMAL LOW (ref 36.0–46.0)
Hemoglobin: 8 g/dL — ABNORMAL LOW (ref 12.0–15.0)
MCH: 29.2 pg (ref 26.0–34.0)
MCHC: 30.5 g/dL (ref 30.0–36.0)
MCV: 95.6 fL (ref 80.0–100.0)
Platelets: 174 10*3/uL (ref 150–400)
RBC: 2.74 MIL/uL — ABNORMAL LOW (ref 3.87–5.11)
RDW: 17.8 % — ABNORMAL HIGH (ref 11.5–15.5)
WBC: 7.4 10*3/uL (ref 4.0–10.5)
nRBC: 0 % (ref 0.0–0.2)

## 2020-08-28 LAB — GLUCOSE, CAPILLARY
Glucose-Capillary: 157 mg/dL — ABNORMAL HIGH (ref 70–99)
Glucose-Capillary: 124 mg/dL — ABNORMAL HIGH (ref 70–99)
Glucose-Capillary: 160 mg/dL — ABNORMAL HIGH (ref 70–99)
Glucose-Capillary: 112 mg/dL — ABNORMAL HIGH (ref 70–99)
Glucose-Capillary: 176 mg/dL — ABNORMAL HIGH (ref 70–99)

## 2020-08-28 LAB — BASIC METABOLIC PANEL
Anion gap: 3 — ABNORMAL LOW (ref 5–15)
BUN: 42 mg/dL — ABNORMAL HIGH (ref 8–23)
CO2: 25 mmol/L (ref 22–32)
Calcium: 7.6 mg/dL — ABNORMAL LOW (ref 8.9–10.3)
Chloride: 114 mmol/L — ABNORMAL HIGH (ref 98–111)
Creatinine, Ser: 1.26 mg/dL — ABNORMAL HIGH (ref 0.44–1.00)
GFR, Estimated: 39 mL/min — ABNORMAL LOW (ref >60)
Glucose, Bld: 159 mg/dL — ABNORMAL HIGH (ref 70–99)
Potassium: 5 mmol/L (ref 3.5–5.1)
Sodium: 142 mmol/L (ref 135–145)

## 2020-08-28 NOTE — Progress Notes (Signed)
Subjective:  Spoke with patient and patient's daughter in room. Feeling better now, but still awaiting SNF placement.  Daughter asking for letter to send to homeland security in order for her patient's other daughter to come and visit from [place].  Spoke with social work, will see if patient is amenable to going to [place] and [facility] does not have any opening. Medically stable for discharge with outpatient f/u with ID on 10/21 for PICC line removal.  Objective:  Vital signs in last 24 hours: Vitals:   08/28/20 1230 08/28/20 1300 08/28/20 1330 08/28/20 1501  BP:    120/79  Pulse: 97 84 78 82  Resp:    18  Temp:    (!) 97.3 F (36.3 C)  TempSrc:      SpO2: 96% 93% 97% 98%  Weight:      Height:      SpO2: 98 % O2 Flow Rate (L/min): 1 L/min - turned down to room air during our evaluation   General: chronically ill-appearing female, lying in bed. CV: normal rate and regular rhythm, no m/r/g Pulm: CTABL, no adventitious sounds noted. Abdomen: soft, nondistended, nontender, +BS MSK: 1-2+ bilateral LE pitting edema Neuro: no focal deficits   Filed Weights   08/22/20 0500 08/24/20 0500 08/25/20 0700  Weight: 88 kg 87.1 kg 86.1 kg    Intake/Output Summary (Last 24 hours) at 08/28/2020 1513 Last data filed at 08/28/2020 1300 Gross per 24 hour  Intake 840 ml  Output 400 ml  Net 440 ml    CBC Latest Ref Rng & Units 08/28/2020 08/27/2020 08/26/2020  WBC 4.0 - 10.5 K/uL 7.4 8.6 7.7  Hemoglobin 12.0 - 15.0 g/dL 8.0(L) 8.6(L) 8.2(L)  Hematocrit 36 - 46 % 26.2(L) 27.3(L) 27.6(L)  Platelets 150 - 400 K/uL 174 143(L) 120(L)   CMP Latest Ref Rng & Units 08/28/2020 08/27/2020 08/25/2020  Glucose 70 - 99 mg/dL 159(H) 179(H) 155(H)  BUN 8 - 23 mg/dL 42(H) 39(H) 45(H)  Creatinine 0.44 - 1.00 mg/dL 1.26(H) 1.29(H) 1.49(H)  Sodium 135 - 145 mmol/L 142 140 141  Potassium 3.5 - 5.1 mmol/L 5.0 5.1 4.8  Chloride 98 - 111 mmol/L 114(H) 112(H) 112(H)  CO2 22 - 32 mmol/L 25 23 22    Calcium 8.9 - 10.3 mg/dL 7.6(L) 7.6(L) 7.7(L)  Total Protein 6.5 - 8.1 g/dL - - 5.7(L)  Total Bilirubin 0.3 - 1.2 mg/dL - - 1.1  Alkaline Phos 38 - 126 U/L - - 233(H)  AST 15 - 41 U/L - - 29  ALT 0 - 44 U/L - - 13    Assessment/Plan:  Principal Problem:   Pyelitis Active Problems:   Sepsis (HCC)   Pressure injury of skin   Bacteremia due to Klebsiella pneumoniae   Abdominal distention   Acute cystitis with hematuria   Hip pain   Leukocytosis  Whitney [name] is 85 year old female with a past medical history of insulin-dependent diabetes, CKD stage III, chronic right hip fracture and bed-bound that presented to [facility] on 10/07 with three-week history of nausea and vomiting complicated by the development of abdominal pain, dysuria found to have 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  #Emphysematous cystitis and left emphysematous pyelitis, active, resolving #ESBL-producing Klebsiella bacteremia, active, resolving Patient was started on broad spectrum IV antibiotics with meropenem. Urology was consulted, and Dr. Abner Greenspan performed left ureteral stent placement, foley catheter placement, and decompression of left renal collecting system on 10/08. Follow-up CT abdomen and pelvis wo contrast revealed  significant improvement. Following procedure, urology recommended a voiding trial with initiation of Flomax as well as continuation of antibiotics for a prolonged course. Given the need for a prolonged course of antibiotics, we consulted ID who recommended meropenem to ertapenem upon discharge which patient will receive via PICC which was placed on 10/13. Spoke with social work, will see if patient is willing to go to [place] as [facility] does not have any availabilities at this time. - Continue meropenem until discharge, then transition to ertapenem 500mg  IV q24h through 10/20, as per ID - Patient has follow-up appointment with ID on 10/21 with desire to  evaluate patient prior to removing PICC - Tylenol for moderate pain - Dilaudid for severe pain - Monitor I/Os - PT/OT continue to eval and treat (recommending SNF or  Supervision / Assisstance - 24 hour) - Pending SNF placement  #AKI on CKD Stage III, resolved Patient with a history of CKD3 presented with Creatinine of 2.16, 1.3 at baseline, most recently stable at 1.26. AKI likely has component of prerenal and postrenal. Stent placed for obstruction. Patient had foley discontinued on 10/13 with addition of flomax. Produced minimal UOP despite lasix challenge on 10/15. Bladder scan following lasix challenge showed only 38mL. Will continue to closely monitor UOP and encourage PO intake. All urine output is not being collected via pure wick. -Continue Flomax -CMP daily  #Hypoalbuminemia, active #Volume overload, active Patient with significant hypoalbuminemia (1.2). RD consulted and recommended Glucerna shake TID and Prosource plus TID for protein supplementation. Patient's appetite is improving with improvement in pain, recent bowel movements, and urinating independently.  As patient's hypoalbuminemia improves, suspect that intravascular volume status will significantly improve. Given patient has lower extremity edema and cannot tolerate keeping legs elevated, RN will place patient in boots to help with elevation. -Continue Glucerna shake TID -Prosource plus TID -Assist with feeding patient -CMP daily  #T2DM, chronic A1c 7.8. Patient's blood glucose well controlled on current regimen while hospitalized. -CBG monitoring -Basal: Continue 4 units lantus QHS -Continue sensitive sliding scale -Correction scale QHS  #Left hip pain Obtained x-rays of hips and pelvis, no acute changes noted. Patient has had a left hip replacement and has a chronic right femoral neck fracture. Communication is difficult given patient's speaks [place], but patient also found to have a skin tear. We will continue to  treat chronic hip pain and RN consulting wound care for help with skin tear.  Code status: Full Code VTE ppx: Heparin IVF: none Diet: Dysphagia 1 Bowel Regimen: Senna-docusate and Miralax twice daily   Virl Axe, MD 08/28/2020, 3:13 PM Pager: [phone] After 5pm on weekdays and 1pm on weekends: On Call pager [phone]

## 2020-08-28 NOTE — TOC Progression Note (Addendum)
Transition of Care [facility]) - Progression Note    Patient Details  Name: Whitney [name] MRN: [id number] Date of Birth: [birth year]  Transition of Care [facility]) CM/SW Contact  Joanne Chars, LCSW Phone Number: 08/28/2020, 2:58 PM  Clinical Narrative:   CSW spoke with Lexine Baton at [facility] and they do not have available bed today.  CSW spoke with pt daughter Caro Hight regarding the one bed offer from [place].  Daughter has researched Clinical biochemist and is not interested in her mother being admitted there, her preference is [facility].    Expected Discharge Plan: [employer] Barriers to Discharge: Continued Medical Work up  Expected Discharge Plan and Services Expected Discharge Plan: [place]   Discharge Planning Services: CM Consult Post Acute Care Choice: [place] Living arrangements for the past 2 months: Single Family Home                                       Social Determinants of Health (SDOH) Interventions    Readmission Risk Interventions No flowsheet data found.

## 2020-08-28 NOTE — Progress Notes (Signed)
Nutrition Follow-up  DOCUMENTATION CODES:   Not applicable  INTERVENTION:  Provide Glucerna Shake po TID, each supplement provides 220 kcal and 10 grams of protein  Provide 30 ml Prosource plus po TID, each supplement provides 100 kcal and 15 grams of protein.   Encourage adequate PO intake.   NUTRITION DIAGNOSIS:   Increased nutrient needs related to wound healing as evidenced by estimated needs; onoing  GOAL:   Patient will meet greater than or equal to 90% of their needs; progressing  MONITOR:   PO intake, Supplement acceptance, Skin, Weight trends, Labs, I & O's  REASON FOR ASSESSMENT:   Consult Assessment of nutrition requirement/status, Wound healing  ASSESSMENT:   85 year old female with past medical history of type 2 diabetes, CKD stage IIIb and chronic right hip fracture who presented to the ED with multiple episodes of nausea and vomiting over the last couple weeks. Pt with septic shock secondary to emphysematous pyelitis/cystitis and gram-negative bacteremia  Pt continues on a dysphagia 1 diet with thin liquids. Meal completion has been varied from 10-100% with most intake today at 100% for breakfast and lunch. Pt currently has Glucerna and Prostat ordered and has been consuming them. RD to continue with current orders to aid in caloric and protein needs. SNF placement ongoing.  Labs and medications reviewed.   Diet Order:   Diet Order            DIET - DYS 1 Room service appropriate? Yes with Assist; Fluid consistency: Thin  Diet effective now                 EDUCATION NEEDS:   Not appropriate for education at this time  Skin:  Skin Assessment: Skin Integrity Issues: Skin Integrity Issues:: Stage II Stage II: R leg  Last BM:  10/18  Height:   Ht Readings from Last 1 Encounters:  08/18/20 5\' 3"  (1.6 m)    Weight:   Wt Readings from Last 1 Encounters:  08/25/20 86.1 kg    BMI:  Body mass index is 33.62 kg/m.  Estimated Nutritional  Needs:   Kcal:  1600-1800  Protein:  80-90 grams  Fluid:  >/= 1.6 L/day  Corrin Parker, MS, RD, LDN RD pager number/after hours weekend pager number on Amion.

## 2020-08-28 NOTE — Progress Notes (Signed)
Pharmacy Antibiotic Note  Whitney [name] is a 85 y.o. female admitted on 08/17/2020 with urosepsis.  Blood cultures growing ESBL producing Klebsiella pneumoniae  Pharmacy has been consulted for Meropenem.  SCr has improved to 1.26. WBC has normalized. On D11 of meropenem   Plan: - Continue Meropenem 1g IV q12h - Planning to switch to ertapenem on discharge through 10/20. If patient remains admitted after 10/20, may need ID's input on whether it is safe to stop abx.   Height: 5\' 3"  (160 cm) Weight: 86.1 kg (189 lb 13.1 oz) IBW/kg (Calculated) : 52.4  Temp (24hrs), Avg:98 F (36.7 C), Min:97.7 F (36.5 C), Max:98.2 F (36.8 C)  Recent Labs  Lab 08/23/20 0638 08/23/20 0638 08/24/20 0310 08/25/20 0500 08/26/20 0934 08/27/20 1049 08/28/20 0446  WBC 13.5*   < > 10.1 8.8 7.7 8.6 7.4  CREATININE 1.71*  --  1.56* 1.49*  --  1.29* 1.26*   < > = values in this interval not displayed.    Estimated Creatinine Clearance: 35.2 mL/min (A) (by C-G formula based on SCr of 1.26 mg/dL (H)).    No Known Allergies  Vanc 10/7>>10/8 Cefepime 10/7>>10/8 Merrem 10/8>>  10/7 BCx: BCID ESBL Kleb pneumo.  Switching to Meropenem 10/8 UCx (cystoscopy) >> ESBL K PNA (S-Cipro/Imi/Zosyn) 10/7 UCx: mult species  10/7 Cdiff PCR: sent  Thank you for allowing pharmacy to be a part of this patient's care.  Albertina Parr, PharmD., BCPS, BCCCP Clinical Pharmacist Please refer to [facility] for unit-specific pharmacist

## 2020-08-29 DIAGNOSIS — N12 Tubulo-interstitial nephritis, not specified as acute or chronic: Secondary | ICD-10-CM | POA: Diagnosis not present

## 2020-08-29 DIAGNOSIS — N3001 Acute cystitis with hematuria: Secondary | ICD-10-CM | POA: Diagnosis not present

## 2020-08-29 DIAGNOSIS — R140 Abdominal distension (gaseous): Secondary | ICD-10-CM | POA: Diagnosis not present

## 2020-08-29 DIAGNOSIS — R7881 Bacteremia: Secondary | ICD-10-CM | POA: Diagnosis not present

## 2020-08-29 LAB — BASIC METABOLIC PANEL
Anion gap: 4 — ABNORMAL LOW (ref 5–15)
BUN: 39 mg/dL — ABNORMAL HIGH (ref 8–23)
CO2: 24 mmol/L (ref 22–32)
Calcium: 7.5 mg/dL — ABNORMAL LOW (ref 8.9–10.3)
Chloride: 115 mmol/L — ABNORMAL HIGH (ref 98–111)
Creatinine, Ser: 1.35 mg/dL — ABNORMAL HIGH (ref 0.44–1.00)
GFR, Estimated: 36 mL/min — ABNORMAL LOW (ref >60)
Glucose, Bld: 105 mg/dL — ABNORMAL HIGH (ref 70–99)
Potassium: 4.6 mmol/L (ref 3.5–5.1)
Sodium: 143 mmol/L (ref 135–145)

## 2020-08-29 LAB — CBC
HCT: 24.8 % — ABNORMAL LOW (ref 36.0–46.0)
Hemoglobin: 7.8 g/dL — ABNORMAL LOW (ref 12.0–15.0)
MCH: 29.9 pg (ref 26.0–34.0)
MCHC: 31.5 g/dL (ref 30.0–36.0)
MCV: 95 fL (ref 80.0–100.0)
Platelets: 169 10*3/uL (ref 150–400)
RBC: 2.61 MIL/uL — ABNORMAL LOW (ref 3.87–5.11)
RDW: 18 % — ABNORMAL HIGH (ref 11.5–15.5)
WBC: 7.1 10*3/uL (ref 4.0–10.5)
nRBC: 0 % (ref 0.0–0.2)

## 2020-08-29 LAB — GLUCOSE, CAPILLARY
Glucose-Capillary: 139 mg/dL — ABNORMAL HIGH (ref 70–99)
Glucose-Capillary: 71 mg/dL (ref 70–99)
Glucose-Capillary: 116 mg/dL — ABNORMAL HIGH (ref 70–99)
Glucose-Capillary: 128 mg/dL — ABNORMAL HIGH (ref 70–99)
Glucose-Capillary: 115 mg/dL — ABNORMAL HIGH (ref 70–99)

## 2020-08-29 MED ORDER — DEXTROSE 50 % IV SOLN
INTRAVENOUS | Status: AC
  Administered 2020-08-29: 50 mL
  Filled 2020-08-29: qty 50

## 2020-08-29 NOTE — Progress Notes (Signed)
Hypoglycemic Event  CBG: 67  Treatment: D50 50 mL (25 gm)  Symptoms: None  Follow-up CBG: YDXA:[id number] CBG Result:128  Possible Reasons for Event: Inadequate meal intake  Comments/MD notified:Hypoglycemic protocol    Tom-Johnson, Renea Ee

## 2020-08-29 NOTE — Progress Notes (Signed)
Subjective: " Severe left lower leg pain when she does not sleep well and where her leg is not positioned properly"   Antibiotics:  Anti-infectives (From admission, onward)   Start     Dose/Rate Route Frequency Ordered Stop   08/25/20 2000  meropenem (MERREM) 1 g in sodium chloride 0.9 % 100 mL IVPB        1 g 200 mL/hr over 30 Minutes Intravenous 2 times daily 08/25/20 1116     08/19/20 1200  vancomycin (VANCOREADY) IVPB 750 mg/150 mL  Status:  Discontinued        750 mg 150 mL/hr over 60 Minutes Intravenous Every 48 hours 08/17/20 1206 08/18/20 0237   08/18/20 1200  ceFEPIme (MAXIPIME) 2 g in sodium chloride 0.9 % 100 mL IVPB  Status:  Discontinued        2 g 200 mL/hr over 30 Minutes Intravenous Every 24 hours 08/17/20 1206 08/18/20 0237   08/18/20 0330  meropenem (MERREM) 500 mg in sodium chloride 0.9 % 100 mL IVPB  Status:  Discontinued        500 mg 200 mL/hr over 30 Minutes Intravenous 2 times daily 08/18/20 0242 08/25/20 1116   08/17/20 1000  ceFEPIme (MAXIPIME) 2 g in sodium chloride 0.9 % 100 mL IVPB        2 g 200 mL/hr over 30 Minutes Intravenous  Once 08/17/20 0945 08/17/20 1123   08/17/20 1000  metroNIDAZOLE (FLAGYL) IVPB 500 mg        500 mg 100 mL/hr over 60 Minutes Intravenous  Once 08/17/20 0945 08/17/20 1234   08/17/20 1000  vancomycin (VANCOCIN) IVPB 1000 mg/200 mL premix        1,000 mg 200 mL/hr over 60 Minutes Intravenous  Once 08/17/20 0945 08/17/20 1400      Medications: Scheduled Meds: . (feeding supplement) PROSource Plus  30 mL Oral TID BM  . Chlorhexidine Gluconate Cloth  6 each Topical Daily  . feeding supplement (GLUCERNA SHAKE)  237 mL Oral TID BM  . heparin injection (subcutaneous)  5,000 Units Subcutaneous Q8H  . insulin aspart  0-5 Units Subcutaneous QHS  . insulin aspart  0-9 Units Subcutaneous TID WC  . insulin glargine  4 Units Subcutaneous QHS  . polyethylene glycol  17 g Oral BID  . senna-docusate  1 tablet Oral BID  .  tamsulosin  0.4 mg Oral Daily   Continuous Infusions: . lactated ringers 800 mL/hr at 08/18/20 2034  . meropenem (MERREM) IV 1 g (08/29/20 1034)   PRN Meds:.acetaminophen, HYDROmorphone (DILAUDID) injection, lidocaine, ondansetron **OR** ondansetron (ZOFRAN) IV    Objective: Weight change:   Intake/Output Summary (Last 24 hours) at 08/29/2020 1542 Last data filed at 08/29/2020 0800 Gross per 24 hour  Intake 444.46 ml  Output 300 ml  Net 144.46 ml   Blood pressure (!) 117/54, pulse 79, temperature 97.8 F (36.6 C), resp. rate 20, height 5\' 3"  (1.6 m), weight 86.1 kg, SpO2 98 %. Temp:  [97.8 F (36.6 C)-98.2 F (36.8 C)] 97.8 F (36.6 C) (10/19 1231) Pulse Rate:  [79-93] 79 (10/19 1231) Resp:  [20-22] 20 (10/19 1231) BP: (115-122)/(42-77) 117/54 (10/19 1231) SpO2:  [92 %-98 %] 98 % (10/19 0349)  Physical Exam: General: Alert and awake, oriented x3, she is very upset about her left leg which is hurting her quite a bit  HEENT: anicteric sclera, EOMI CVS regular rate, normal  Chest: , no wheezing, no respiratory distress Abdomen: soft  non-distended, nontender Extremities: Edematous and quite tender in the left lower extremity where she has fractures Skin: no rashes Neuro: nonfocal  CBC:    BMET Recent Labs    08/28/20 0446 08/29/20 1110  NA 142 143  K 5.0 4.6  CL 114* 115*  CO2 25 24  GLUCOSE 159* 105*  BUN 42* 39*  CREATININE 1.26* 1.35*  CALCIUM 7.6* 7.5*     Liver Panel  No results for input(s): PROT, ALBUMIN, AST, ALT, ALKPHOS, BILITOT, BILIDIR, IBILI in the last 72 hours.     Sedimentation Rate No results for input(s): ESRSEDRATE in the last 72 hours. C-Reactive Protein No results for input(s): CRP in the last 72 hours.  Micro Results: Recent Results (from the past 720 hour(s))  Blood culture (routine x 2)     Status: Abnormal   Collection Time: 08/17/20  8:51 AM   Specimen: BLOOD  Result Value Ref Range Status   Specimen Description  BLOOD LEFT ANTECUBITAL  Final   Special Requests   Final    BOTTLES DRAWN AEROBIC AND ANAEROBIC Blood Culture results may not be optimal due to an inadequate volume of blood received in culture bottles   Culture  Setup Time   Final    GRAM NEGATIVE RODS IN BOTH AEROBIC AND ANAEROBIC BOTTLES CRITICAL RESULT CALLED TO, READ BACK BY AND VERIFIED WITH: G. ABBOTT,PHARMD 0222 08/18/2020 Mena Goes Performed at [facility], [place] [address] [place], [place] [address]    Culture (A)  Final    KLEBSIELLA PNEUMONIAE Confirmed Extended Spectrum Beta-Lactamase Producer (ESBL).  In bloodstream infections from ESBL organisms, carbapenems are preferred over piperacillin/tazobactam. They are shown to have a lower risk of mortality.    Report Status 08/19/2020 FINAL  Final   Organism ID, Bacteria KLEBSIELLA PNEUMONIAE  Final      Susceptibility   Klebsiella pneumoniae - MIC*    AMPICILLIN >=32 RESISTANT Resistant     CEFAZOLIN >=64 RESISTANT Resistant     CEFEPIME 8 INTERMEDIATE Intermediate     CEFTAZIDIME RESISTANT Resistant     CEFTRIAXONE >=64 RESISTANT Resistant     CIPROFLOXACIN <=0.25 SENSITIVE Sensitive     GENTAMICIN >=16 RESISTANT Resistant     IMIPENEM <=0.25 SENSITIVE Sensitive     TRIMETH/SULFA >=320 RESISTANT Resistant     AMPICILLIN/SULBACTAM >=32 RESISTANT Resistant     PIP/TAZO 8 SENSITIVE Sensitive     * KLEBSIELLA PNEUMONIAE  Blood Culture ID Panel (Reflexed)     Status: Abnormal   Collection Time: 08/17/20  8:51 AM  Result Value Ref Range Status   Enterococcus faecalis NOT DETECTED NOT DETECTED Final   Enterococcus Faecium NOT DETECTED NOT DETECTED Final   Listeria monocytogenes NOT DETECTED NOT DETECTED Final   Staphylococcus species NOT DETECTED NOT DETECTED Final   Staphylococcus aureus (BCID) NOT DETECTED NOT DETECTED Final   Staphylococcus epidermidis NOT DETECTED NOT DETECTED Final   Staphylococcus lugdunensis NOT DETECTED NOT DETECTED Final   Streptococcus species  NOT DETECTED NOT DETECTED Final   Streptococcus agalactiae NOT DETECTED NOT DETECTED Final   Streptococcus pneumoniae NOT DETECTED NOT DETECTED Final   Streptococcus pyogenes NOT DETECTED NOT DETECTED Final   A.calcoaceticus-baumannii NOT DETECTED NOT DETECTED Final   Bacteroides fragilis NOT DETECTED NOT DETECTED Final   Enterobacterales DETECTED (A) NOT DETECTED Final    Comment: Enterobacterales represent a large order of gram negative bacteria, not a single organism. CRITICAL RESULT CALLED TO, READ BACK BY AND VERIFIED WITH: G. ABBOTT,PHARMD 0222 08/18/2020 T.  TYSOR    Enterobacter cloacae complex NOT DETECTED NOT DETECTED Final   Escherichia coli NOT DETECTED NOT DETECTED Final   Klebsiella aerogenes NOT DETECTED NOT DETECTED Final   Klebsiella oxytoca NOT DETECTED NOT DETECTED Final   Klebsiella pneumoniae DETECTED (A) NOT DETECTED Final    Comment: CRITICAL RESULT CALLED TO, READ BACK BY AND VERIFIED WITH: G. ABBOTT,PHARMD 0222 08/18/2020 T. TYSOR    Proteus species NOT DETECTED NOT DETECTED Final   Salmonella species NOT DETECTED NOT DETECTED Final   Serratia marcescens NOT DETECTED NOT DETECTED Final   Haemophilus influenzae NOT DETECTED NOT DETECTED Final   Neisseria meningitidis NOT DETECTED NOT DETECTED Final   Pseudomonas aeruginosa NOT DETECTED NOT DETECTED Final   Stenotrophomonas maltophilia NOT DETECTED NOT DETECTED Final   Candida albicans NOT DETECTED NOT DETECTED Final   Candida auris NOT DETECTED NOT DETECTED Final   Candida glabrata NOT DETECTED NOT DETECTED Final   Candida krusei NOT DETECTED NOT DETECTED Final   Candida parapsilosis NOT DETECTED NOT DETECTED Final   Candida tropicalis NOT DETECTED NOT DETECTED Final   Cryptococcus neoformans/gattii NOT DETECTED NOT DETECTED Final   CTX-M ESBL DETECTED (A) NOT DETECTED Final    Comment: CRITICAL RESULT CALLED TO, READ BACK BY AND VERIFIED WITH: G. ABBOTT,PHARMD 0222 08/18/2020 T. TYSOR (NOTE) Extended  spectrum beta-lactamase detected. Recommend a carbapenem as initial therapy.      Carbapenem resistance IMP NOT DETECTED NOT DETECTED Final   Carbapenem resistance KPC NOT DETECTED NOT DETECTED Final   Carbapenem resistance NDM NOT DETECTED NOT DETECTED Final   Carbapenem resist OXA 48 LIKE NOT DETECTED NOT DETECTED Final   Carbapenem resistance VIM NOT DETECTED NOT DETECTED Final    Comment: Performed at [facility], [place] [address], [place], [place] [address]  Respiratory Panel by RT PCR (Flu A&B, Covid) - Nasopharyngeal Swab     Status: None   Collection Time: 08/17/20  9:56 AM   Specimen: Nasopharyngeal Swab  Result Value Ref Range Status   SARS Coronavirus 2 by RT PCR NEGATIVE NEGATIVE Final    Comment: (NOTE) SARS-CoV-2 target nucleic acids are NOT DETECTED.  The SARS-CoV-2 RNA is generally detectable in upper respiratoy specimens during the acute phase of infection. The lowest concentration of SARS-CoV-2 viral copies this assay can detect is 131 copies/mL. A negative result does not preclude SARS-Cov-2 infection and should not be used as the sole basis for treatment or other patient management decisions. A negative result may occur with  improper specimen collection/handling, submission of specimen other than nasopharyngeal swab, presence of viral mutation(s) within the areas targeted by this assay, and inadequate number of viral copies (<131 copies/mL). A negative result must be combined with clinical observations, patient history, and epidemiological information. The expected result is Negative.  Fact Sheet for Patients:  PinkCheek.be  Fact Sheet for Healthcare Providers:  GravelBags.it  This test is no t yet approved or cleared by the [place] FDA and  has been authorized for detection and/or diagnosis of SARS-CoV-2 by FDA under an Emergency Use Authorization (EUA). This EUA will remain  in effect  (meaning this test can be used) for the duration of the COVID-19 declaration under Section 564(b)(1) of the Act, 21 U.S.C. section 360bbb-3(b)(1), unless the authorization is terminated or revoked sooner.     Influenza A by PCR NEGATIVE NEGATIVE Final   Influenza B by PCR NEGATIVE NEGATIVE Final    Comment: (NOTE) The Xpert Xpress SARS-CoV-2/FLU/RSV assay is intended as an  aid in  the diagnosis of influenza from Nasopharyngeal swab specimens and  should not be used as a sole basis for treatment. Nasal washings and  aspirates are unacceptable for Xpert Xpress SARS-CoV-2/FLU/RSV  testing.  Fact Sheet for Patients: PinkCheek.be  Fact Sheet for Healthcare Providers: GravelBags.it  This test is not yet approved or cleared by the [place] FDA and  has been authorized for detection and/or diagnosis of SARS-CoV-2 by  FDA under an Emergency Use Authorization (EUA). This EUA will remain  in effect (meaning this test can be used) for the duration of the  Covid-19 declaration under Section 564(b)(1) of the Act, 21  U.S.C. section 360bbb-3(b)(1), unless the authorization is  terminated or revoked. Performed at [facility], [place] [address], [place], [place] [address]   Culture, Urine     Status: Abnormal   Collection Time: 08/17/20  1:27 PM   Specimen: Urine, Random  Result Value Ref Range Status   Specimen Description URINE, RANDOM  Final   Special Requests   Final    NONE Performed at [facility], [place] [address] [place], [place] [address]    Culture MULTIPLE SPECIES PRESENT, SUGGEST RECOLLECTION (A)  Final   Report Status 08/19/2020 FINAL  Final  Blood culture (routine x 2)     Status: Abnormal   Collection Time: 08/18/20  1:35 AM   Specimen: BLOOD RIGHT HAND  Result Value Ref Range Status   Specimen Description BLOOD RIGHT HAND  Final   Special Requests   Final    BOTTLES DRAWN AEROBIC AND ANAEROBIC Blood  Culture adequate volume   Culture  Setup Time   Final    GRAM NEGATIVE RODS IN BOTH AEROBIC AND ANAEROBIC BOTTLES CRITICAL VALUE NOTED.  VALUE IS CONSISTENT WITH PREVIOUSLY REPORTED AND CALLED VALUE.    Culture (A)  Final    KLEBSIELLA PNEUMONIAE SUSCEPTIBILITIES PERFORMED ON PREVIOUS CULTURE WITHIN THE LAST 5 DAYS. Performed at [facility], [place] [address] [place], [place] [address]    Report Status 08/20/2020 FINAL  Final  Culture, fungus without smear     Status: None (Preliminary result)   Collection Time: 08/18/20  8:35 PM   Specimen: Urine, Cystoscope; Urinary  Result Value Ref Range Status   Specimen Description FLUID  Final   Special Requests Urine, Cystoscopy  Final   Culture   Final    NO FUNGUS ISOLATED AFTER 11 DAYS Performed at [facility], [place] [address] [place], [place] [address]    Report Status PENDING  Incomplete  Aerobic/Anaerobic Culture (surgical/deep wound)     Status: None   Collection Time: 08/18/20  8:35 PM   Specimen: Urine, Cystoscope; Urinary  Result Value Ref Range Status   Specimen Description FLUID  Final   Special Requests Urine, Cystoscopy  Final   Gram Stain   Final    MODERATE WBC PRESENT, PREDOMINANTLY PMN FEW GRAM VARIABLE ROD    Culture   Final    FEW KLEBSIELLA PNEUMONIAE Confirmed Extended Spectrum Beta-Lactamase Producer (ESBL).  In bloodstream infections from ESBL organisms, carbapenems are preferred over piperacillin/tazobactam. They are shown to have a lower risk of mortality. NO ANAEROBES ISOLATED Performed at [facility], [place] [address], [place], [place] [address]    Report Status 08/23/2020 FINAL  Final   Organism ID, Bacteria KLEBSIELLA PNEUMONIAE  Final      Susceptibility   Klebsiella pneumoniae - MIC*    AMPICILLIN >=32 RESISTANT Resistant  CEFAZOLIN >=64 RESISTANT Resistant     CEFEPIME >=32 RESISTANT Resistant     CEFTAZIDIME RESISTANT Resistant     CEFTRIAXONE >=64 RESISTANT Resistant      CIPROFLOXACIN <=0.25 SENSITIVE Sensitive     GENTAMICIN >=16 RESISTANT Resistant     IMIPENEM <=0.25 SENSITIVE Sensitive     TRIMETH/SULFA >=320 RESISTANT Resistant     AMPICILLIN/SULBACTAM >=32 RESISTANT Resistant     PIP/TAZO 16 SENSITIVE Sensitive     * FEW KLEBSIELLA PNEUMONIAE  Acid Fast Smear (AFB)     Status: None   Collection Time: 08/18/20  8:35 PM   Specimen: Urine, Cystoscope; Urinary  Result Value Ref Range Status   AFB Specimen Processing Concentration  Final   Acid Fast Smear Negative  Final    Comment: (NOTE) Performed At: [facility] [address] [place], [place] [id number] Rush Farmer MD ID:[id number]    Source (AFB) Urine, Cystoscopy  Final    Comment: Performed at [facility], [place] [address], [place], [place] [address]    Studies/Results: No results found.    Assessment/Plan:  INTERVAL HISTORY: Patient is neared completion of antibiotic course that we charted for her.   Principal Problem:   Pyelitis Active Problems:   Sepsis ([place])   Pressure injury of skin   Bacteremia due to Klebsiella pneumoniae   Abdominal distention   Acute cystitis with hematuria   Hip pain   Leukocytosis    Whitney [name] is a 85 y.o. 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  We had planned on giving her antibiotics through the 20th which is tomorrow.  The patient is approaching discharge from the hospital.  In talking today she says that all of her dysuria and abdominal pain flank pain has completely resolved.  I would give there a dose of ertapenem which will last for another 24 hours and then discontinue antibiotics.  Left lower extremity pain where she has fractures: I could not ascertain how long this has been going on but she was happy when she was repositioned  Sign off for now please call with further questions.   LOS: 12 days   Alcide Evener 08/29/2020, 3:42  PM

## 2020-08-29 NOTE — Progress Notes (Signed)
Physical Therapy Treatment and DISCHARGE  Patient Details Name: Whitney [name] MRN: [id number] DOB: October 16, 1935 Today's Date: 08/29/2020    History of Present Illness Whitney [name] is 85 year old female with a past medical history of insulin-dependent diabetes, CKD stage III, and chronic right hip fracture that presented to [facility] with 3 week history of nausea and vomiting complicated by the development of abdominal pain, dysuria found to have septic shock secondary to emphysematous cystitis and left-sided pyelitis.    PT Comments    Used arabic interpreter. Pt reports she's been bed bound for 13 years, she urinates and has BMs in the bed, eats in bed, doesn't not leave the bed. She reports she broke her L shoulder in 2006, no surgical intervention and has a chronically dislocated R hip. Pt with no desire to participate in therapy. Attempted LE exercises, pt adamantly refused, pt refused working with L UE due to "its broken" despite using it functionally to pull up blankets, attempted R UE exercises/ROM however per interpreter pt not understanding and keeps repeating "I can not, I can not" despite education and max verbal cues. Pt is at baseline level of function and lives with family who provides the dependent care for the patient. Pt with no further acute PT needs at this time. PT SIGNING OFF on patient. Please re-consult if needed in future.     Follow Up Recommendations  Supervision/Assistance - 24 hour     Equipment Recommendations       Recommendations for Other Services       Precautions / Restrictions Precautions Precautions: Fall Precaution Comments: chronic R Hip dislocation, edema t/o abdomen, pelvis and LEs Restrictions Weight Bearing Restrictions: No Other Position/Activity Restrictions: pt bed bound    Mobility  Bed Mobility Overal bed mobility: Needs Assistance             General bed mobility comments: pt adamantly refused any movement, per RN they just cleaned her up  from having BM  Transfers                 General transfer comment: n/a  Ambulation/Gait                 Stairs             Wheelchair Mobility    Modified Rankin (Stroke Patients Only)       Balance Overall balance assessment:  (pt bed bound)                                          Cognition Arousal/Alertness: Awake/alert Behavior During Therapy: Anxious Overall Cognitive Status: No family/caregiver present to determine baseline cognitive functioning                                 General Comments: used interpreter in arabic who reports patient was having difficulty understanding tasks asked and wasn't answering questions asked consistently in regard to PLOF      Exercises      General Comments General comments (skin integrity, edema, etc.): pt very edematous throughout abdomen, pelvis, LEs      Pertinent Vitals/Pain Pain Assessment: Faces Faces Pain Scale: Hurts worst Pain Location: all over with movement    Home Living  Prior Function            PT Goals (current goals can now be found in the care plan section) Progress towards PT goals: Not progressing toward goals - comment    Frequency    Other (Comment) (d/c from services)      PT Plan Discharge plan needs to be updated    Co-evaluation              AM-PAC PT "6 Clicks" Mobility   Outcome Measure  Help needed turning from your back to your side while in a flat bed without using bedrails?: Total Help needed moving from lying on your back to sitting on the side of a flat bed without using bedrails?: Total Help needed moving to and from a bed to a chair (including a wheelchair)?: Total Help needed standing up from a chair using your arms (e.g., wheelchair or bedside chair)?: Total Help needed to walk in hospital room?: Total Help needed climbing 3-5 steps with a railing? : Total 6 Click Score: 6     End of Session   Activity Tolerance: Patient limited by pain Patient left: in bed;with bed alarm set Nurse Communication: Mobility status;Need for lift equipment PT Visit Diagnosis: Other abnormalities of gait and mobility (R26.89);Pain     Time: 1110-1130 PT Time Calculation (min) (ACUTE ONLY): 20 min  Charges:  $Therapeutic Exercise: 8-22 mins                     Kittie Plater, PT, DPT Acute Rehabilitation Services Pager #: [phone] Office #: [phone]    Berline Lopes 08/29/2020, 1:35 PM

## 2020-08-29 NOTE — Progress Notes (Addendum)
Subjective:  No acute overnight events.  Saw patient along with daughter in room. She is doing fine. Daughter explained that she did not want pt to go to [facility] and instead wants to wait for another availability.  ID saw patient, okay with giving one more day of abx before discontinuing abx therapy tomorrow.  Daughter reports that patient is doing better after having eaten homemade food.   All questions and concerns were addressed at this time.  Objective:  Vital signs in last 24 hours: Vitals:   08/28/20 2200 08/28/20 2321 08/29/20 0349 08/29/20 1231  BP: (!) 122/56 (!) 119/42 115/77 (!) 117/54  Pulse:  89 93 79  Resp: (!) 22   20  Temp: 98.2 F (36.8 C) 97.8 F (36.6 C) 97.9 F (36.6 C) 97.8 F (36.6 C)  TempSrc: Axillary Oral Oral   SpO2: 94% 92% 98%   Weight:      Height:      SpO2: 98 % O2 Flow Rate (L/min): 1 L/min - turned down to room air during our evaluation  General: chronically ill-appearing female, lying in bed. CV: normal rate and regular rhythm, no m/r/g Pulm: CTABL, no adventitious sounds noted Abdomen: soft, nontender, nondistended, +BS MSK: 2+ bilateral LE pitting edema up to knees. Significant hip pain L>R, chronic. Neuro: no focal deficits   Filed Weights   08/22/20 0500 08/24/20 0500 08/25/20 0700  Weight: 88 kg 87.1 kg 86.1 kg    Intake/Output Summary (Last 24 hours) at 08/29/2020 1730 Last data filed at 08/29/2020 1542 Gross per 24 hour  Intake 444.46 ml  Output 1150 ml  Net -705.54 ml    CBC Latest Ref Rng & Units 08/29/2020 08/28/2020 08/27/2020  WBC 4.0 - 10.5 K/uL 7.1 7.4 8.6  Hemoglobin 12.0 - 15.0 g/dL 7.8(L) 8.0(L) 8.6(L)  Hematocrit 36 - 46 % 24.8(L) 26.2(L) 27.3(L)  Platelets 150 - 400 K/uL 169 174 143(L)   CMP Latest Ref Rng & Units 08/29/2020 08/28/2020 08/27/2020  Glucose 70 - 99 mg/dL 105(H) 159(H) 179(H)  BUN 8 - 23 mg/dL 39(H) 42(H) 39(H)  Creatinine 0.44 - 1.00 mg/dL 1.35(H) 1.26(H) 1.29(H)  Sodium 135 - 145  mmol/L 143 142 140  Potassium 3.5 - 5.1 mmol/L 4.6 5.0 5.1  Chloride 98 - 111 mmol/L 115(H) 114(H) 112(H)  CO2 22 - 32 mmol/L 24 25 23   Calcium 8.9 - 10.3 mg/dL 7.5(L) 7.6(L) 7.6(L)  Total Protein 6.5 - 8.1 g/dL - - -  Total Bilirubin 0.3 - 1.2 mg/dL - - -  Alkaline Phos 38 - 126 U/L - - -  AST 15 - 41 U/L - - -  ALT 0 - 44 U/L - - -    Assessment/Plan:  Principal Problem:   Pyelitis Active Problems:   Sepsis (HCC)   Pressure injury of skin   Bacteremia due to Klebsiella pneumoniae   Abdominal distention   Acute cystitis with hematuria   Hip pain   Leukocytosis  Ms. Lathon is 85 year old female with a past medical history of insulin-dependent diabetes, CKD stage III, chronic right hip fracture and bed-bound that presented to [facility] on 10/07 with three-week history of nausea and vomiting complicated by the development of abdominal pain, dysuria found to have 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 Pending SNF placement.  #Emphysematous cystitis and left emphysematous pyelitis, active, resolving #ESBL-producing Klebsiella bacteremia, active, resolving Patient was started on broad spectrum IV antibiotics with meropenem. Urology was consulted, and Dr. Abner Greenspan performed left ureteral  stent placement, foley catheter placement, and decompression of left renal collecting system on 10/08. Follow-up CT abdomen and pelvis wo contrast revealed significant improvement. Following procedure, urology recommended a voiding trial with initiation of Flomax as well as continuation of antibiotics for a prolonged course. Given the need for a prolonged course of antibiotics, we consulted ID who recommended meropenem to ertapenem upon discharge which patient will receive via PICC which was placed on 10/13. Spoke with social work, will see if patient is willing to go to [place] as [facility] does not have any availabilities at this time. - Continue meropenem for 1 more  day, as per ID - Follow ID recs for PICC removal - Tylenol for moderate pain - Dilaudid for severe pain - Monitor I/Os - PT/OT continue to eval and treat (recommending SNF or  Supervision / Assisstance - 24 hour) - Pending SNF placement  #AKI on CKD Stage III, resolved Patient with a history of CKD3 presented with Creatinine of 2.16, 1.3 at baseline, most recently at 1.35. AKI likely has component of prerenal and postrenal. Stent placed for obstruction. Patient had foley discontinued on 10/13 with addition of flomax. Produced minimal UOP despite lasix challenge on 10/15. Bladder scan following lasix challenge showed only 16mL. Will continue to closely monitor UOP and encourage PO intake. All urine output is not being collected via pure wick. Had about 1.15L UOP today. +5.77L since admission. -Continue Flomax -CMP daily -daily weights -strict I/O's  #Hypoalbuminemia, active #Volume overload, active Patient with significant hypoalbuminemia (1.2). RD consulted and recommended Glucerna shake TID and Prosource plus TID for protein supplementation. Patient's appetite is improving with improvement in pain, recent bowel movements, and urinating independently.  As patient's hypoalbuminemia improves, suspect that intravascular volume status will significantly improve. Given patient has lower extremity edema and cannot tolerate keeping legs elevated, RN will place patient in boots to help with elevation. -Continue Glucerna shake TID -Prosource plus TID -Assist with feeding patient -CMP daily  #T2DM, chronic A1c 7.8. Patient's blood glucose well controlled on current regimen while hospitalized. -CBG monitoring -Basal: Continue 4 units lantus QHS -Continue sensitive sliding scale -Correction scale QHS  #Left hip pain Obtained x-rays of hips and pelvis, no acute changes noted. Patient has had a left hip replacement and has a chronic right femoral neck fracture. Communication is difficult given  patient's speaks [place], but patient also found to have a skin tear. We will continue to treat chronic hip pain and RN consulting wound care for help with skin tear.  Code status: Full Code VTE ppx: Heparin IVF: none Diet: Dysphagia 1 Bowel Regimen: Senna-docusate and Miralax twice daily   Virl Axe, MD 08/29/2020, 5:30 PM Pager: [phone] After 5pm on weekdays and 1pm on weekends: On Call pager [phone]

## 2020-08-30 LAB — CBC WITH DIFFERENTIAL/PLATELET
Abs Immature Granulocytes: 0.04 10*3/uL (ref 0.00–0.07)
Basophils Absolute: 0.1 10*3/uL (ref 0.0–0.1)
Basophils Relative: 1 %
Eosinophils Absolute: 0.2 10*3/uL (ref 0.0–0.5)
Eosinophils Relative: 2 %
HCT: 26.5 % — ABNORMAL LOW (ref 36.0–46.0)
Hemoglobin: 7.9 g/dL — ABNORMAL LOW (ref 12.0–15.0)
Immature Granulocytes: 1 %
Lymphocytes Relative: 19 %
Lymphs Abs: 1.5 10*3/uL (ref 0.7–4.0)
MCH: 29.7 pg (ref 26.0–34.0)
MCHC: 29.8 g/dL — ABNORMAL LOW (ref 30.0–36.0)
MCV: 99.6 fL (ref 80.0–100.0)
Monocytes Absolute: 1.2 10*3/uL — ABNORMAL HIGH (ref 0.1–1.0)
Monocytes Relative: 15 %
Neutro Abs: 4.8 10*3/uL (ref 1.7–7.7)
Neutrophils Relative %: 62 %
Platelets: 194 10*3/uL (ref 150–400)
RBC: 2.66 MIL/uL — ABNORMAL LOW (ref 3.87–5.11)
RDW: 18.4 % — ABNORMAL HIGH (ref 11.5–15.5)
WBC: 7.7 10*3/uL (ref 4.0–10.5)
nRBC: 0 % (ref 0.0–0.2)

## 2020-08-30 LAB — GLUCOSE, CAPILLARY
Glucose-Capillary: 67 mg/dL — ABNORMAL LOW (ref 70–99)
Glucose-Capillary: 65 mg/dL — ABNORMAL LOW (ref 70–99)
Glucose-Capillary: 109 mg/dL — ABNORMAL HIGH (ref 70–99)
Glucose-Capillary: 148 mg/dL — ABNORMAL HIGH (ref 70–99)
Glucose-Capillary: 120 mg/dL — ABNORMAL HIGH (ref 70–99)

## 2020-08-30 LAB — COMPREHENSIVE METABOLIC PANEL
ALT: 15 U/L (ref 0–44)
AST: 42 U/L — ABNORMAL HIGH (ref 15–41)
Albumin: 1.2 g/dL — ABNORMAL LOW (ref 3.5–5.0)
Alkaline Phosphatase: 211 U/L — ABNORMAL HIGH (ref 38–126)
Anion gap: 4 — ABNORMAL LOW (ref 5–15)
BUN: 38 mg/dL — ABNORMAL HIGH (ref 8–23)
CO2: 24 mmol/L (ref 22–32)
Calcium: 7.5 mg/dL — ABNORMAL LOW (ref 8.9–10.3)
Chloride: 113 mmol/L — ABNORMAL HIGH (ref 98–111)
Creatinine, Ser: 1.37 mg/dL — ABNORMAL HIGH (ref 0.44–1.00)
GFR, Estimated: 36 mL/min — ABNORMAL LOW (ref >60)
Glucose, Bld: 102 mg/dL — ABNORMAL HIGH (ref 70–99)
Potassium: 4.8 mmol/L (ref 3.5–5.1)
Sodium: 141 mmol/L (ref 135–145)
Total Bilirubin: 0.6 mg/dL (ref 0.3–1.2)
Total Protein: 5.8 g/dL — ABNORMAL LOW (ref 6.5–8.1)

## 2020-08-30 MED ORDER — INSULIN GLARGINE 100 UNIT/ML ~~LOC~~ SOLN: 2.0000 [IU] | Freq: Every day | SUBCUTANEOUS | Status: DC

## 2020-08-30 MED ORDER — SENNOSIDES-DOCUSATE SODIUM 8.6-50 MG PO TABS
1.0000 | ORAL_TABLET | Freq: Every day | ORAL | Status: DC
  Filled 2020-08-30: qty 1

## 2020-08-30 MED ORDER — INSULIN GLARGINE 100 UNIT/ML ~~LOC~~ SOLN
2.0000 [IU] | Freq: Every day | SUBCUTANEOUS | Status: DC
  Administered 2020-08-30 – 2020-08-31 (×2): 2 [IU] via SUBCUTANEOUS
  Filled 2020-08-30 (×2): qty 0.02

## 2020-08-30 NOTE — Progress Notes (Signed)
Subjective: Had an episode of hypoglycemia this AM, given amp of D50, decreased lantus to 2 units qhs.  Patient feels better today. Daughter encouraging more PO intake.  On last day of abx today. She is medically clear for discharge at this time.  Objective:  Vital signs in last 24 hours: Vitals:   08/29/20 2021 08/29/20 2337 08/30/20 0425 08/30/20 0500  BP: 123/61 (!) 121/56 (!) 118/51   Pulse:      Resp: 18 20 20    Temp: 98.5 F (36.9 C) 98.1 F (36.7 C) 97.8 F (36.6 C)   TempSrc: Axillary Axillary Axillary   SpO2:      Weight:    90.6 kg  Height:      SpO2: 98 % O2 Flow Rate (L/min): 1 L/min - turned down to room air during our evaluation  General: chronically ill-appearing female, lying inbed. CV: normal rate and regular rhythm, no m/r/g Pulm: CTABL, no adventitious sounds noted. Abdomen: soft, nontender, nondistended, +BS MSK: 2+ bilateral LE pitting edema up to knees. Hip pain L>R, chronic. Neuro: no focal deficits   Filed Weights   08/24/20 0500 08/25/20 0700 08/30/20 0500  Weight: 87.1 kg 86.1 kg 90.6 kg    Intake/Output Summary (Last 24 hours) at 08/30/2020 0653 Last data filed at 08/29/2020 1857 Gross per 24 hour  Intake 420 ml  Output 1250 ml  Net -830 ml    CBC Latest Ref Rng & Units 08/30/2020 08/29/2020 08/28/2020  WBC 4.0 - 10.5 K/uL 7.7 7.1 7.4  Hemoglobin 12.0 - 15.0 g/dL 7.9(L) 7.8(L) 8.0(L)  Hematocrit 36 - 46 % 26.5(L) 24.8(L) 26.2(L)  Platelets 150 - 400 K/uL 194 169 174   CMP Latest Ref Rng & Units 08/30/2020 08/29/2020 08/28/2020  Glucose 70 - 99 mg/dL 102(H) 105(H) 159(H)  BUN 8 - 23 mg/dL 38(H) 39(H) 42(H)  Creatinine 0.44 - 1.00 mg/dL 1.37(H) 1.35(H) 1.26(H)  Sodium 135 - 145 mmol/L 141 143 142  Potassium 3.5 - 5.1 mmol/L 4.8 4.6 5.0  Chloride 98 - 111 mmol/L 113(H) 115(H) 114(H)  CO2 22 - 32 mmol/L 24 24 25   Calcium 8.9 - 10.3 mg/dL 7.5(L) 7.5(L) 7.6(L)  Total Protein 6.5 - 8.1 g/dL 5.8(L) - -  Total Bilirubin 0.3 - 1.2  mg/dL 0.6 - -  Alkaline Phos 38 - 126 U/L 211(H) - -  AST 15 - 41 U/L 42(H) - -  ALT 0 - 44 U/L 15 - -    Assessment/Plan:  Principal Problem:   Pyelitis Active Problems:   Sepsis (HCC)   Pressure injury of skin   Bacteremia due to Klebsiella pneumoniae   Abdominal distention   Acute cystitis with hematuria   Hip pain   Leukocytosis  Whitney [name] is 85 year old female with a past medical history of insulin-dependent diabetes, CKD stage III, chronic right hip fracture and bed-bound that presented to [facility] on 10/07 with three-week history of nausea and vomiting complicated by the development of abdominal pain, dysuria found to have 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 Pending SNF placement.  #Emphysematous cystitis and left emphysematous pyelitis, active, resolving #ESBL-producing Klebsiella bacteremia, active, resolving Patient was started on broad spectrum IV antibiotics with meropenem. Urology was consulted, and Dr. Abner Greenspan performed left ureteral stent placement, foley catheter placement, and decompression of left renal collecting system on 10/08. Follow-up CT abdomen and pelvis wo contrast revealed significant improvement. Following procedure, urology recommended a voiding trial with initiation of Flomax as well as continuation of antibiotics  for a prolonged course. Patient on last day of meropenem on 08/30/20. Daughter does not want patient to go to [place]. Social work helping with discharge planning as patient is medically stable for discharge. - On last day of meropenem - will discontinue PICC line tomorrow morning after last dose of abx - Tylenol for moderate pain - Dilaudid for severe pain - Monitor I/Os - PT/OT recommending SNF or  Supervision / Assistance - 24 hour - Social work helping with discharge planning  #AKI on CKD Stage III, resolved Patient with a history of CKD3 presented with Creatinine of 2.16, 1.3 at baseline, most  recently at 1.37. Patient had foley discontinued on 10/13 with addition of flomax. Will continue to closely monitor UOP and encourage PO intake. All urine output is not being collected via pure wick. Had about 1.25L UOP via external cath along with 2x unmeasured occurrences.  -Continue Flomax -CMP daily -daily weights -strict I/O's  #Hypoalbuminemia, active #Volume overload, active Patient with significant hypoalbuminemia (1.2). RD consulted and recommended Glucerna shake TID and Prosource plus TID for protein supplementation. Patient's appetite is improving with improvement in pain, recent bowel movements, and urinating independently.  As patient's hypoalbuminemia improves, suspect that intravascular volume status will significantly improve. Given patient has lower extremity edema and cannot tolerate keeping legs elevated, RN will place patient in boots to help with elevation. -obtain urinalysis to look for proteinuria, some seen in prior UAs -Continue Glucerna shake TID -Prosource plus TID -Assist with feeding patient -CMP daily  #T2DM, chronic A1c 7.8. Patient's blood glucose well controlled on current regimen while hospitalized. Had an episode of hypoglycemia this AM, given amp of D50, will decrease lantus to 2 units qhs -CBG monitoring -Basal: decreased to 2 units lantus QHS -Continue sensitive sliding scale -Correction scale QHS  #Left hip pain Obtained x-rays of hips and pelvis, no acute changes noted. Patient has had a left hip replacement and has a chronic right femoral neck fracture. Communication is difficult given patient's speaks [place], but patient also found to have a skin tear. We will continue to treat chronic hip pain and RN consulting wound care for help with skin tear.  Code status: Full Code VTE ppx: Heparin IVF: none Diet: Dysphagia 1 Bowel Regimen: Senna-docusate and Miralax twice daily   Virl Axe, MD 08/30/2020, 6:53 AM Pager: [phone] After 5pm on  weekdays and 1pm on weekends: On Call pager [phone]

## 2020-08-30 NOTE — Progress Notes (Signed)
Interpreter used as patient was under the impression that she could not eat.  Orders reviewed and found that this was not the case and this allayed her concern.

## 2020-08-30 NOTE — Progress Notes (Signed)
Occupational Therapy Treatment and Discharge Patient Details Name: Whitney [name] MRN: [id number] DOB: 1935/10/17 Today's Date: 08/30/2020    History of present illness Whitney [name] is 85 year old female with a past medical history of insulin-dependent diabetes, CKD stage III, and chronic right hip fracture that presented to [facility] with 3 week history of nausea and vomiting complicated by the development of abdominal pain, dysuria found to have septic shock secondary to emphysematous cystitis and left-sided pyelitis.   OT comments  Pt agreeable to minimal activity today. Used of video interpreter throughout. Pt reports she is bedbound at baseline and daughter/home nurse assist with ADL tasks. Pt did engage in simple grooming ADL task (face washing) given setup/supervision today. Per NT pt has been able to feed herself. Suspect pt is likely at her baseline with simple ADL tasks given she is not able to tolerate further mobility at this time. Will defer further OT services to next venue of care at this time as pt currently with no further acute OT needs. Recommend pt continue to engage in simple ADL (grooming, self-feeding) as able with family and nursing staff. Acute OT to sign off, please re-consult should pt's needs change.    Follow Up Recommendations  SNF;Supervision/Assistance - 24 hour (vs home pending available family/caregiver assist )    Equipment Recommendations  Hospital bed          Precautions / Restrictions Precautions Precautions: Fall Precaution Comments: chronic R Hip dislocation, edema t/o abdomen, pelvis and LEs Restrictions Weight Bearing Restrictions: No Other Position/Activity Restrictions: pt bed bound       Mobility Bed Mobility               General bed mobility comments: pt would not attempt   Transfers                 General transfer comment: n/a    Balance Overall balance assessment:  (pt bed bound)                                          ADL either performed or assessed with clinical judgement   ADL Overall ADL's : Needs assistance/impaired   Eating/Feeding Details (indicate cue type and reason): per NT pt has been able to perform self-feeding with supervision  Grooming: Wash/dry face;Set up;Bed level Grooming Details (indicate cue type and reason): encouragement to perform on her own                                                        Cognition Arousal/Alertness: Awake/alert Behavior During Therapy: Anxious Overall Cognitive Status: Difficult to assess                                 General Comments: pt reports her daughter typically feeds her however NT present part of session and reports pt has been able to complete self-feeding with supervision         Exercises     Shoulder Instructions       General Comments video interpreter used, Whitney [name]    Pertinent Vitals/ Pain       Pain Assessment: Faces Faces Pain Scale: No  hurt Pain Location: though pt self limiting with mobility as moving is painful  Pain Intervention(s): Monitored during session  Home Living                                          Prior Functioning/Environment              Frequency  Min 2X/week        Progress Toward Goals  OT Goals(current goals can now be found in the care plan section)  Progress towards OT goals:  (d/c from therapy as pt at baseline )  Acute Rehab OT Goals Patient Stated Goal: to help pain OT Goal Formulation: With patient Time For Goal Achievement: 09/05/20 Potential to Achieve Goals: Fair ADL Goals Pt Will Perform Eating: with set-up;bed level Pt Will Perform Grooming: with min assist;bed level Pt Will Perform Upper Body Bathing: with min assist;bed level  Plan Discharge plan remains appropriate;Other (comment) (pt likely at baseline - will defer further OT to nextservice)    Co-evaluation                  AM-PAC OT "6 Clicks" Daily Activity     Outcome Measure   Help from another person eating meals?: A Little Help from another person taking care of personal grooming?: A Little Help from another person toileting, which includes using toliet, bedpan, or urinal?: Total Help from another person bathing (including washing, rinsing, drying)?: Total Help from another person to put on and taking off regular upper body clothing?: Total Help from another person to put on and taking off regular lower body clothing?: Total 6 Click Score: 10    End of Session    OT Visit Diagnosis: Muscle weakness (generalized) (M62.81);Other abnormalities of gait and mobility (R26.89);Other symptoms and signs involving cognitive function;Pain Pain - part of body:  (generalized )   Activity Tolerance Patient tolerated treatment well   Patient Left in bed;with call bell/phone within reach   Nurse Communication Mobility status        Time: 8403-7543 OT Time Calculation (min): 14 min  Charges: OT General Charges $OT Visit: 1 Visit OT Treatments $Self Care/Home Management : 8-22 mins  Whitney [name], OT Acute Rehabilitation Services Pager [phone] Office [place] 08/30/2020, 5:21 PM

## 2020-08-31 ENCOUNTER — Inpatient Hospital Stay (HOSPITAL_COMMUNITY): Payer: 59

## 2020-08-31 ENCOUNTER — Inpatient Hospital Stay: Payer: 59 | Admitting: Infectious Diseases

## 2020-08-31 HISTORY — PX: IR REMOVAL TUN CV CATH W/O FL: IMG2289

## 2020-08-31 LAB — CBC
HCT: 25.6 % — ABNORMAL LOW (ref 36.0–46.0)
Hemoglobin: 7.6 g/dL — ABNORMAL LOW (ref 12.0–15.0)
MCH: 29.3 pg (ref 26.0–34.0)
MCHC: 29.7 g/dL — ABNORMAL LOW (ref 30.0–36.0)
MCV: 98.8 fL (ref 80.0–100.0)
Platelets: 189 10*3/uL (ref 150–400)
RBC: 2.59 MIL/uL — ABNORMAL LOW (ref 3.87–5.11)
RDW: 18.3 % — ABNORMAL HIGH (ref 11.5–15.5)
WBC: 7.6 10*3/uL (ref 4.0–10.5)
nRBC: 0 % (ref 0.0–0.2)

## 2020-08-31 LAB — BASIC METABOLIC PANEL
Anion gap: 3 — ABNORMAL LOW (ref 5–15)
BUN: 35 mg/dL — ABNORMAL HIGH (ref 8–23)
CO2: 24 mmol/L (ref 22–32)
Calcium: 7.4 mg/dL — ABNORMAL LOW (ref 8.9–10.3)
Chloride: 113 mmol/L — ABNORMAL HIGH (ref 98–111)
Creatinine, Ser: 1.28 mg/dL — ABNORMAL HIGH (ref 0.44–1.00)
GFR, Estimated: 39 mL/min — ABNORMAL LOW (ref >60)
Glucose, Bld: 142 mg/dL — ABNORMAL HIGH (ref 70–99)
Potassium: 4.8 mmol/L (ref 3.5–5.1)
Sodium: 140 mmol/L (ref 135–145)

## 2020-08-31 LAB — GLUCOSE, CAPILLARY
Glucose-Capillary: 102 mg/dL — ABNORMAL HIGH (ref 70–99)
Glucose-Capillary: 116 mg/dL — ABNORMAL HIGH (ref 70–99)
Glucose-Capillary: 130 mg/dL — ABNORMAL HIGH (ref 70–99)
Glucose-Capillary: 112 mg/dL — ABNORMAL HIGH (ref 70–99)

## 2020-08-31 MED ORDER — TAMSULOSIN HCL 0.4 MG PO CAPS: 0.4000 mg | ORAL_CAPSULE | Freq: Every day | ORAL | 1 refills | Status: AC

## 2020-08-31 NOTE — TOC Progression Note (Signed)
Transition of Care [facility]) - Progression Note    Patient Details  Name: Whitney [name] MRN: [id number] Date of Birth: [birth year]  Transition of Care [facility]) CM/SW [place], [place] Phone Number: 08/31/2020, 10:20 AM  Clinical Narrative:     CSW called patient's daughter, left voice message.  Thurmond Butts, MSW, [place] Clinical Social Worker    Expected Discharge Plan: Skilled Nursing Facility Barriers to Discharge: Continued Medical Work up  Expected Discharge Plan and Services Expected Discharge Plan: [place]   Discharge Planning Services: CM Consult Post Acute Care Choice: [place] Living arrangements for the past 2 months: Single Family Home                                       Social Determinants of Health (SDOH) Interventions    Readmission Risk Interventions No flowsheet data found.

## 2020-08-31 NOTE — TOC Progression Note (Signed)
Transition of Care [facility]) - Progression Note    Patient Details  Name: Whitney [name] MRN: [id number] Date of Birth: 03-19-1935  Transition of Care [facility]) CM/SW [place], [place] Phone Number: 08/31/2020, 12:59 PM  Clinical Narrative:     CSW spoke with the patient's daughter- informed of anticipated discharge today, home. CSW spoke with [facility], they are aware of discharge of plans and has approved PTAR.  Thurmond Butts, MSW, [place] Clinical Social Worker   Expected Discharge Plan: Skilled Nursing Facility Barriers to Discharge: Continued Medical Work up  Expected Discharge Plan and Services Expected Discharge Plan: [place]   Discharge Planning Services: CM Consult Post Acute Care Choice: [place] Living arrangements for the past 2 months: Single Family Home                                       Social Determinants of Health (SDOH) Interventions    Readmission Risk Interventions No flowsheet data found.

## 2020-08-31 NOTE — Progress Notes (Addendum)
Subjective:  Reports feeling good. Still having some L hip pain, chronic.  Last day of meropenem was yesterday. Off antibiotics now. Awaiting central tunneled cath removal. Otherwise medically stable for discharge.  Spoke with TOC briefly, they will reach out to patient's daughter in regards to discharging home as she is no longer in need of SNF placement as per PT.    Objective:  Vital signs in last 24 hours: Vitals:   08/30/20 1955 08/30/20 2345 08/31/20 0341 08/31/20 0500  BP: (!) 121/40 (!) 112/36 111/73   Pulse:  85 90   Resp:      Temp: 97.9 F (36.6 C) 97.8 F (36.6 C) 98.2 F (36.8 C)   TempSrc: Axillary Oral Oral   SpO2:  100% 97%   Weight:    90 kg  Height:      SpO2: 97 % O2 Flow Rate (L/min): 1 L/min - turned down to room air during our evaluation  General: chronically ill-appearing female, lying in bed, NAD. CV: normal rate and regular rhythm, no m/r/g Pulm: CTABL, no adventitious sounds noted Abdomen: soft, nontender, nondistended, +BS MSK: 2+ bilateral LE pitting edema up to knees. Hip pain L>R, chronic. Neuro: no focal deficits.   Filed Weights   08/25/20 0700 08/30/20 0500 08/31/20 0500  Weight: 86.1 kg 90.6 kg 90 kg    Intake/Output Summary (Last 24 hours) at 08/31/2020 0716 Last data filed at 08/31/2020 0300 Gross per 24 hour  Intake 490 ml  Output --  Net 490 ml    CBC Latest Ref Rng & Units 08/31/2020 08/30/2020 08/29/2020  WBC 4.0 - 10.5 K/uL 7.6 7.7 7.1  Hemoglobin 12.0 - 15.0 g/dL 7.6(L) 7.9(L) 7.8(L)  Hematocrit 36 - 46 % 25.6(L) 26.5(L) 24.8(L)  Platelets 150 - 400 K/uL 189 194 169   CMP Latest Ref Rng & Units 08/31/2020 08/30/2020 08/29/2020  Glucose 70 - 99 mg/dL 142(H) 102(H) 105(H)  BUN 8 - 23 mg/dL 35(H) 38(H) 39(H)  Creatinine 0.44 - 1.00 mg/dL 1.28(H) 1.37(H) 1.35(H)  Sodium 135 - 145 mmol/L 140 141 143  Potassium 3.5 - 5.1 mmol/L 4.8 4.8 4.6  Chloride 98 - 111 mmol/L 113(H) 113(H) 115(H)  CO2 22 - 32 mmol/L 24 24 24    Calcium 8.9 - 10.3 mg/dL 7.4(L) 7.5(L) 7.5(L)  Total Protein 6.5 - 8.1 g/dL - 5.8(L) -  Total Bilirubin 0.3 - 1.2 mg/dL - 0.6 -  Alkaline Phos 38 - 126 U/L - 211(H) -  AST 15 - 41 U/L - 42(H) -  ALT 0 - 44 U/L - 15 -    Assessment/Plan:  Principal Problem:   Pyelitis Active Problems:   Sepsis (HCC)   Pressure injury of skin   Bacteremia due to Klebsiella pneumoniae   Abdominal distention   Acute cystitis with hematuria   Hip pain   Leukocytosis  Ms. Rollo is 85 year old female with a past medical history of insulin-dependent diabetes, CKD stage III, chronic right hip fracture and bed-bound that presented to [facility] on 10/07 with three-week history of nausea and vomiting complicated by the development of abdominal pain, dysuria found to have 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 Medically stable for discharge.  #Emphysematous cystitis and left emphysematous pyelitis, active, resolving #ESBL-producing Klebsiella bacteremia, active, resolving Patient was started on broad spectrum IV antibiotics with meropenem. Urology was consulted, and Dr. Abner Greenspan performed left ureteral stent placement, foley catheter placement, and decompression of left renal collecting system on 10/08. Follow-up CT abdomen and pelvis  wo contrast revealed significant improvement. Following procedure, urology recommended a voiding trial with initiation of Flomax as well as continuation of antibiotics for a prolonged course. Patient on last day of meropenem on 08/30/20. Daughter does not want patient to go to [facility]. Social work helping with discharge planning as patient is medically stable for discharge once central tunneled cath has been removed. - Off antibiotics now - awaiting central tunneled catheter removal, but will discharge today. - Monitor I/Os - PT/OT no longer recommending SNF placement, fine with 24-hour supervision/assistance - Social work helping with  discharge planning - patient needs to see her PCP for pain management of chronic hip pain  #AKI on CKD Stage III, resolved Patient with a history of CKD3 presented with Creatinine of 2.16, 1.3 at baseline, most recently at 1.28. Patient had foley discontinued on 10/13 with addition of flomax. Will continue to closely monitor UOP and encourage PO intake. All urine output is not being collected via pure wick. Had 2x urine occurrences. Continue flomax after discharge.  #Hypoalbuminemia, active #Volume overload, active  Patient with significant hypoalbuminemia (1.2). RD consulted and recommended Glucerna shake TID and Prosource plus TID for protein supplementation. Patient's appetite is improving with improvement in pain, recent bowel movements, and urinating independently.  As patient's hypoalbuminemia improves, suspect that intravascular volume status will significantly improve. Given patient has lower extremity edema and cannot tolerate keeping legs elevated, RN will place patient in boots to help with elevation. -urinalysis pending, but no need to keep patient inpatient for results  #T2DM, chronic A1c 7.8. Patient's blood glucose well controlled on current regimen while hospitalized. Resume home diabetic regimen at discharge.  #Left hip pain Obtained x-rays of hips and pelvis, no acute changes noted. Patient has had a left hip replacement and has a chronic right femoral neck fracture. She has required pain management for chronic hip pain throughout admission. She needs to follow up with her PCP for further evaluation of chronic hip pain. From chart review, it appears that she is on percocet 5-325mg  (1-2 tablets) q8h prn at home. She can continue her home meds, but do not think it is necessary to prescribe new pain meds at this time. This would be better done by her PCP as outpatient.  Code status: Full Code VTE ppx: Heparin IVF: none Diet: Dysphagia 1 Bowel Regimen: Senna-docusate and Miralax  twice daily   Virl Axe, MD 08/31/2020, 7:16 AM Pager: [phone] After 5pm on weekdays and 1pm on weekends: On Call pager [phone]

## 2020-08-31 NOTE — TOC Progression Note (Signed)
Transition of Care [facility]) - Progression Note    Patient Details  Name: Whitney [name] MRN: [id number] Date of Birth: 1935/06/23  Transition of Care [facility]) CM/SW [place], [place] Phone Number: 08/31/2020, 5:39 PM  Clinical Narrative:     [facility] has been arranged- PTAR informed will be late pick up- CSW called patient's daughter advised per PTAR it will be after 7p before they pick up- daughter states understanding. CSW informed RN to call patient's daughter when leaving the unit.   Thurmond Butts, MSW, [place] Clinical Social Worker   Expected Discharge Plan: Skilled Nursing Facility Barriers to Discharge: Continued Medical Work up  Expected Discharge Plan and Services Expected Discharge Plan: [place]   Discharge Planning Services: CM Consult Post Acute Care Choice: [place] Living arrangements for the past 2 months: Single Family Home Expected Discharge Date: 08/31/20                                     Social Determinants of Health (SDOH) Interventions    Readmission Risk Interventions No flowsheet data found.

## 2020-08-31 NOTE — Discharge Summary (Addendum)
Name: Whitney [name] MRN: [id number] DOB: 1935-02-01 85 y.o. PCP: Patient, No Pcp Per  Date of Admission: 08/17/2020  8:38 AM Date of Discharge:  Attending Physician: Oda Kilts, MD  Discharge Diagnosis: 1. Emphysematous cystitis and left emphysematous pyelitis - resolved ESBL Klebsiella bacteremia - resolved AKI on CKDIIIb  Discharge Medications: Allergies as of 08/31/2020   No Known Allergies      Medication List     TAKE these medications    acetaminophen 500 MG tablet Commonly known as: TYLENOL Take 2 tablets by mouth every 8 (eight) hours as needed for moderate pain.   calcitonin (salmon) 200 UNIT/ACT nasal spray Commonly known as: MIACALCIN/FORTICAL Place 1 spray into alternate nostrils daily.   famotidine 20 MG tablet Commonly known as: PEPCID Take 1 tablet (20 mg total) by mouth 2 (two) times daily.   ibuprofen 200 MG tablet Commonly known as: ADVIL Take 200-400 mg by mouth daily as needed for headache (pain).   insulin glargine 100 UNIT/ML Solostar Pen Commonly known as: LANTUS Inject 5 Units into the skin at bedtime. What changed: how much to take   multivitamin with minerals Tabs tablet Take 1 tablet by mouth daily.   oxyCODONE-acetaminophen 5-325 MG tablet Commonly known as: Percocet Take 1-2 tablets by mouth every 8 (eight) hours as needed for severe pain.   Pen Needles 30G X 5 MM Misc Use as instructed.   tamsulosin 0.4 MG Caps capsule Commonly known as: FLOMAX Take 1 capsule (0.4 mg total) by mouth daily. Start taking on: September 01, 2020   Vitamin D (Ergocalciferol) 1.25 MG (50000 UNIT) Caps capsule Commonly known as: DRISDOL Take 1 capsule (50,000 Units total) by mouth every 7 (seven) days. What changed: additional instructions        Disposition and follow-up:   Ms.Whitney [name] was discharged from [facility] in [place] condition.  At the hospital follow up visit please address:  1.  Emphysematous  cystitis and L emphysematous pyelitis. ESBL-producing Klebsiella bacteremia. Urology placed L ureteral stent on 08/18/20. Completed broad spectrum IV abx course with meropenem. Discharged on tamsulosin. Advised to f/u with new PCP in 1 week. F/u with urology for stent exchange/removal on September 27, 2020.  AKI on CKD Stage IIIb - resolved. Cr 1.28 on day of discharge, recheck at follow up  Hypoalbuminemia. Albumin 1.2, likely playing major role in volume overload. Received aggressive protein supplementation while here with minimal improvement. Will need high protein diet after discharge.  T2DM. Will need diabetic counseling and possible addition of oral diabetic medication as per PCP.  Chronic hip pain. Follow up for pain management.  2.  Labs / imaging needed at time of follow-up: CBC, CMP  3.  Pending labs/ test needing follow-up: None  Follow-up Appointments: -F/u with new PCP in 1 week  Follow-up Information     [place]. Schedule an appointment as soon as possible for a visit on 09/27/2020.   Why: For stent exchange/removal at [place] information: [facility] Course by problem list: 1. Emphysematous cystitis and left emphysematous pyelitis, resolved. ESBL-producing Klebsiella bacteremia, resolved. Patient was started on broad spectrum IV antibiotics with meropenem. Urology was consulted, and Dr. Abner Greenspan performed left ureteral stent placement, foley catheter placement, and decompression of left renal collecting system on 10/08. Follow-up CT abdomen and pelvis wo contrast revealed significant  improvement. Following procedure, urology recommended a voiding trial with initiation of Flomax as well as continuation of antibiotics for a prolonged course. Patient received last day of meropenem on 08/30/20. Will continue Flomax after discharge. Central tunneled catheter removed on  08/31/20. PT recommending 24-hour supervision at home. Advised to f/u with new PCP in 1 week. F/u with urology on September 27, 2020 for stent exchange/removal.  AKI on CKD Stage IIIb - resolved. Cr 2.16 at admission, baseline ~1.3. Initiated flomax, which will be continued at discharge. Cr 1.28 on day of discharge.  Hypoalbuminemia. Albumin 1.2, likely playing major role in volume overload, unclear etiology. No significant proteinuria on UA. Received aggressive protein supplementation while here with minimal improvement. Will need high protein diet after discharge.  T2DM - stable. On home lantus of 6 units qhs. HbA1c 7.8. Blood sugars generally well controlled during hospital admission, although patient was receiving SSI as well. Will need diabetic counseling and possible addition of oral diabetic medication as per PCP.  Chronic hip pain. Obtained x-rays of hips and pelvis, no acute changes. Patient required pain management throughout admission for hip pain. She is on percocet 5-353m (1-2 tablets) q8h prn at home. Will continue this for now, until she can see PCP for better pain management.  Discharge Vitals:   BP 97/72 (BP Location: Right Arm)   Pulse 60   Temp 98 F (36.7 C) (Axillary)   Resp 13   Ht _0  (1.6 m)   Wt 90 kg   SpO2 96%   BMI 35.15 kg/m   Pertinent Labs, Studies, and Procedures:  CBC Latest Ref Rng & Units 08/31/2020 08/30/2020 08/29/2020  WBC 4.0 - 10.5 K/uL 7.6 7.7 7.1  Hemoglobin 12.0 - 15.0 g/dL 7.6(L) 7.9(L) 7.8(L)  Hematocrit 36 - 46 % 25.6(L) 26.5(L) 24.8(L)  Platelets 150 - 400 K/uL 189 194 169   CMP Latest Ref Rng & Units 08/31/2020 08/30/2020 08/29/2020  Glucose 70 - 99 mg/dL 142(H) 102(H) 105(H)  BUN 8 - 23 mg/dL 35(H) 38(H) 39(H)  Creatinine 0.44 - 1.00 mg/dL 1.28(H) 1.37(H) 1.35(H)  Sodium 135 - 145 mmol/L 140 141 143  Potassium 3.5 - 5.1 mmol/L 4.8 4.8 4.6  Chloride 98 - 111 mmol/L 113(H) 113(H) 115(H)  CO2 22 - 32 mmol/L _1 Calcium 8.9 -  10.3 mg/dL 7.4(L) 7.5(L) 7.5(L)  Total Protein 6.5 - 8.1 g/dL - 5.8(L) -  Total Bilirubin 0.3 - 1.2 mg/dL - 0.6 -  Alkaline Phos 38 - 126 U/L - 211(H) -  AST 15 - 41 U/L - 42(H) -  ALT 0 - 44 U/L - 15 -   Hepatic Function Latest Ref Rng & Units 08/30/2020 08/25/2020 08/24/2020  Total Protein 6.5 - 8.1 g/dL 5.8(L) 5.7(L) 5.6(L)  Albumin 3.5 - 5.0 g/dL 1.2(L) 1.3(L) 1.3(L)  AST 15 - 41 U/L 42(H) 29 32  ALT 0 - 44 U/L _2 Alk Phosphatase 38 - 126 U/L 211(H) 233(H) 236(H)  Total Bilirubin 0.3 - 1.2 mg/dL 0.6 1.1 1.3(H)   HbA1c 7.8  Urinalysis    Component Value Date/Time   COLORURINE AMBER (A) 08/17/2020 1327   APPEARANCEUR CLOUDY (A) 08/17/2020 1327   LABSPEC 1.014 08/17/2020 1327   PHURINE 5.0 08/17/2020 1327   GLUCOSEU >=500 (A) 08/17/2020 1327   HGBUR LARGE (A) 08/17/2020 1327   BILIRUBINUR NEGATIVE 08/17/2020 1327   KETONESUR NEGATIVE 08/17/2020 1327   PROTEINUR 30 (A) 08/17/2020 1327   NITRITE NEGATIVE 08/17/2020 1327   LEUKOCYTESUR  LARGE (A) 08/17/2020 1327   Lactic Acid, Venous    Component Value Date/Time   LATICACIDVEN 2.1 (HH) 08/18/2020 1112   Blood Culture    Component Value Date/Time   SDES FLUID 08/18/2020 2035   SDES FLUID 08/18/2020 2035   SPECREQUEST Urine, Cystoscopy 08/18/2020 2035   SPECREQUEST Urine, Cystoscopy 08/18/2020 2035   CULT  08/18/2020 2035    NO FUNGUS ISOLATED AFTER 13 DAYS Performed at [facility], [place] [address] [place], [place] [address]    CULT  08/18/2020 2035    FEW KLEBSIELLA PNEUMONIAE Confirmed Extended Spectrum Beta-Lactamase Producer (ESBL).  In bloodstream infections from ESBL organisms, carbapenems are preferred over piperacillin/tazobactam. They are shown to have a lower risk of mortality. NO ANAEROBES ISOLATED Performed at [facility], [place] [address] [place],  [address]    REPTSTATUS PENDING 08/18/2020 2035   REPTSTATUS 08/23/2020 FINAL 08/18/2020 2035     Discharge  Instructions: Discharge Instructions     Call MD for:  difficulty breathing, headache or visual disturbances   Complete by: As directed    Call MD for:  extreme fatigue   Complete by: As directed    Call MD for:  hives   Complete by: As directed    Call MD for:  persistant dizziness or light-headedness   Complete by: As directed    Call MD for:  persistant nausea and vomiting   Complete by: As directed    Call MD for:  redness, tenderness, or signs of infection (pain, swelling, redness, odor or green/yellow discharge around incision site)   Complete by: As directed    Call MD for:  severe uncontrolled pain   Complete by: As directed    Call MD for:  temperature >100.4   Complete by: As directed    Diet - low sodium heart healthy   Complete by: As directed    Discharge instructions   Complete by: As directed    Ms. Tamas, it was a pleasure taking care of you during your time here. You came in for a severe infection which was treated with antibiotics while you were here.  Please follow up with a primary care doctor in 1 week for review of medications from hospital discharge and to manage your chronic hip pain. Please also talk to your primary care doctor about diabetes management.  Also, please schedule an appointment with [facility] as soon as possible for a visit on 09/27/2020 for stent exchange/removal at 1PM.  We have started one medication, Flomax 0.82m once daily, to help you with urination. It has been sent to the [facility] [address](your preferred pharmacy).   Increase activity slowly   Complete by: As directed    No wound care   Complete by: As directed        Signed: JVirl Axe MD 08/31/2020, 4:38 PM   Pager: [phone]

## 2020-08-31 NOTE — Procedures (Signed)
Successful removal of tunneled (R)IJ CVC No complications.   Ascencion Dike PA-C Interventional Radiology 08/31/2020 2:38 PM

## 2020-09-01 NOTE — Progress Notes (Signed)
Patient has left via PTAR top be taken home. IV removed and Vitals are WNL.  This nurse called patients daughter to let her know mother is on the way.

## 2020-09-09 LAB — CULTURE, FUNGUS WITHOUT SMEAR

## 2020-09-18 ENCOUNTER — Inpatient Hospital Stay (HOSPITAL_COMMUNITY)
Admission: EM | Admit: 2020-09-18 | Discharge: 2020-10-04 | DRG: 341 | Disposition: A | Discharge status: Home / Self Care | Payer: 59 | Attending: Internal Medicine | Admitting: Internal Medicine

## 2020-09-18 ENCOUNTER — Encounter (HOSPITAL_COMMUNITY)
Admission: EM | Disposition: A | Discharge status: Home / Self Care | Payer: Self-pay | Source: Home / Self Care | Attending: Internal Medicine

## 2020-09-18 ENCOUNTER — Inpatient Hospital Stay (HOSPITAL_COMMUNITY): Payer: 59 | Admitting: Anesthesiology

## 2020-09-18 ENCOUNTER — Encounter (HOSPITAL_COMMUNITY): Payer: Self-pay | Admitting: General Surgery

## 2020-09-18 ENCOUNTER — Other Ambulatory Visit: Payer: Self-pay

## 2020-09-18 ENCOUNTER — Emergency Department (HOSPITAL_COMMUNITY): Payer: 59

## 2020-09-18 DIAGNOSIS — K7460 Unspecified cirrhosis of liver: Secondary | ICD-10-CM | POA: Diagnosis present

## 2020-09-18 DIAGNOSIS — Z794 Long term (current) use of insulin: Secondary | ICD-10-CM

## 2020-09-18 DIAGNOSIS — H9190 Unspecified hearing loss, unspecified ear: Secondary | ICD-10-CM | POA: Diagnosis not present

## 2020-09-18 DIAGNOSIS — I878 Other specified disorders of veins: Secondary | ICD-10-CM | POA: Diagnosis present

## 2020-09-18 DIAGNOSIS — E870 Hyperosmolality and hypernatremia: Secondary | ICD-10-CM | POA: Diagnosis not present

## 2020-09-18 DIAGNOSIS — Z7189 Other specified counseling: Secondary | ICD-10-CM

## 2020-09-18 DIAGNOSIS — K3580 Unspecified acute appendicitis: Secondary | ICD-10-CM | POA: Diagnosis present

## 2020-09-18 DIAGNOSIS — Z96641 Presence of right artificial hip joint: Secondary | ICD-10-CM | POA: Diagnosis present

## 2020-09-18 DIAGNOSIS — R188 Other ascites: Secondary | ICD-10-CM | POA: Diagnosis not present

## 2020-09-18 DIAGNOSIS — R54 Age-related physical debility: Secondary | ICD-10-CM | POA: Diagnosis present

## 2020-09-18 DIAGNOSIS — R627 Adult failure to thrive: Secondary | ICD-10-CM

## 2020-09-18 DIAGNOSIS — E8770 Fluid overload, unspecified: Secondary | ICD-10-CM | POA: Diagnosis present

## 2020-09-18 DIAGNOSIS — E43 Unspecified severe protein-calorie malnutrition: Secondary | ICD-10-CM | POA: Diagnosis present

## 2020-09-18 DIAGNOSIS — D696 Thrombocytopenia, unspecified: Secondary | ICD-10-CM | POA: Diagnosis not present

## 2020-09-18 DIAGNOSIS — Z6835 Body mass index (BMI) 35.0-35.9, adult: Secondary | ICD-10-CM | POA: Diagnosis not present

## 2020-09-18 DIAGNOSIS — N390 Urinary tract infection, site not specified: Secondary | ICD-10-CM | POA: Diagnosis present

## 2020-09-18 DIAGNOSIS — Z20822 Contact with and (suspected) exposure to covid-19: Secondary | ICD-10-CM | POA: Diagnosis present

## 2020-09-18 DIAGNOSIS — E669 Obesity, unspecified: Secondary | ICD-10-CM | POA: Diagnosis present

## 2020-09-18 DIAGNOSIS — K37 Unspecified appendicitis: Secondary | ICD-10-CM | POA: Diagnosis present

## 2020-09-18 DIAGNOSIS — M24451 Recurrent dislocation, right hip: Secondary | ICD-10-CM | POA: Diagnosis present

## 2020-09-18 DIAGNOSIS — M810 Age-related osteoporosis without current pathological fracture: Secondary | ICD-10-CM | POA: Diagnosis present

## 2020-09-18 DIAGNOSIS — Z9049 Acquired absence of other specified parts of digestive tract: Secondary | ICD-10-CM

## 2020-09-18 DIAGNOSIS — R140 Abdominal distension (gaseous): Secondary | ICD-10-CM

## 2020-09-18 DIAGNOSIS — K810 Acute cholecystitis: Secondary | ICD-10-CM

## 2020-09-18 DIAGNOSIS — Z1612 Extended spectrum beta lactamase (ESBL) resistance: Secondary | ICD-10-CM | POA: Diagnosis present

## 2020-09-18 DIAGNOSIS — Z515 Encounter for palliative care: Secondary | ICD-10-CM | POA: Diagnosis not present

## 2020-09-18 DIAGNOSIS — N1831 Chronic kidney disease, stage 3a: Secondary | ICD-10-CM | POA: Diagnosis present

## 2020-09-18 DIAGNOSIS — B961 Klebsiella pneumoniae [K. pneumoniae] as the cause of diseases classified elsewhere: Secondary | ICD-10-CM | POA: Diagnosis not present

## 2020-09-18 DIAGNOSIS — E1122 Type 2 diabetes mellitus with diabetic chronic kidney disease: Secondary | ICD-10-CM | POA: Diagnosis present

## 2020-09-18 DIAGNOSIS — B370 Candidal stomatitis: Secondary | ICD-10-CM | POA: Diagnosis not present

## 2020-09-18 DIAGNOSIS — K8000 Calculus of gallbladder with acute cholecystitis without obstruction: Secondary | ICD-10-CM | POA: Diagnosis not present

## 2020-09-18 DIAGNOSIS — R770 Abnormality of albumin: Secondary | ICD-10-CM

## 2020-09-18 DIAGNOSIS — Y92239 Unspecified place in hospital as the place of occurrence of the external cause: Secondary | ICD-10-CM | POA: Diagnosis not present

## 2020-09-18 DIAGNOSIS — D631 Anemia in chronic kidney disease: Secondary | ICD-10-CM | POA: Diagnosis present

## 2020-09-18 DIAGNOSIS — E8809 Other disorders of plasma-protein metabolism, not elsewhere classified: Secondary | ICD-10-CM | POA: Diagnosis not present

## 2020-09-18 DIAGNOSIS — Z7401 Bed confinement status: Secondary | ICD-10-CM

## 2020-09-18 DIAGNOSIS — E119 Type 2 diabetes mellitus without complications: Secondary | ICD-10-CM | POA: Diagnosis not present

## 2020-09-18 DIAGNOSIS — Z9089 Acquired absence of other organs: Secondary | ICD-10-CM | POA: Diagnosis not present

## 2020-09-18 DIAGNOSIS — N179 Acute kidney failure, unspecified: Secondary | ICD-10-CM | POA: Diagnosis not present

## 2020-09-18 DIAGNOSIS — R531 Weakness: Secondary | ICD-10-CM | POA: Diagnosis not present

## 2020-09-18 DIAGNOSIS — A498 Other bacterial infections of unspecified site: Secondary | ICD-10-CM | POA: Diagnosis not present

## 2020-09-18 DIAGNOSIS — K353 Acute appendicitis with localized peritonitis, without perforation or gangrene: Secondary | ICD-10-CM | POA: Diagnosis not present

## 2020-09-18 DIAGNOSIS — B9689 Other specified bacterial agents as the cause of diseases classified elsewhere: Secondary | ICD-10-CM | POA: Diagnosis not present

## 2020-09-18 DIAGNOSIS — R601 Generalized edema: Secondary | ICD-10-CM | POA: Diagnosis not present

## 2020-09-18 DIAGNOSIS — E869 Volume depletion, unspecified: Secondary | ICD-10-CM | POA: Diagnosis not present

## 2020-09-18 DIAGNOSIS — K219 Gastro-esophageal reflux disease without esophagitis: Secondary | ICD-10-CM | POA: Diagnosis present

## 2020-09-18 DIAGNOSIS — N12 Tubulo-interstitial nephritis, not specified as acute or chronic: Secondary | ICD-10-CM | POA: Diagnosis present

## 2020-09-18 DIAGNOSIS — T508X5A Adverse effect of diagnostic agents, initial encounter: Secondary | ICD-10-CM | POA: Diagnosis not present

## 2020-09-18 DIAGNOSIS — I129 Hypertensive chronic kidney disease with stage 1 through stage 4 chronic kidney disease, or unspecified chronic kidney disease: Secondary | ICD-10-CM | POA: Diagnosis present

## 2020-09-18 DIAGNOSIS — Z87440 Personal history of urinary (tract) infections: Secondary | ICD-10-CM

## 2020-09-18 DIAGNOSIS — L89159 Pressure ulcer of sacral region, unspecified stage: Secondary | ICD-10-CM | POA: Diagnosis present

## 2020-09-18 DIAGNOSIS — K644 Residual hemorrhoidal skin tags: Secondary | ICD-10-CM | POA: Diagnosis present

## 2020-09-18 DIAGNOSIS — R109 Unspecified abdominal pain: Secondary | ICD-10-CM

## 2020-09-18 DIAGNOSIS — I4891 Unspecified atrial fibrillation: Secondary | ICD-10-CM | POA: Diagnosis not present

## 2020-09-18 DIAGNOSIS — Z79899 Other long term (current) drug therapy: Secondary | ICD-10-CM | POA: Diagnosis not present

## 2020-09-18 DIAGNOSIS — N3001 Acute cystitis with hematuria: Secondary | ICD-10-CM | POA: Diagnosis not present

## 2020-09-18 DIAGNOSIS — R110 Nausea: Secondary | ICD-10-CM | POA: Diagnosis not present

## 2020-09-18 DIAGNOSIS — K439 Ventral hernia without obstruction or gangrene: Secondary | ICD-10-CM | POA: Diagnosis present

## 2020-09-18 DIAGNOSIS — Z8619 Personal history of other infectious and parasitic diseases: Secondary | ICD-10-CM

## 2020-09-18 HISTORY — DX: Chronic kidney disease, unspecified: N18.9

## 2020-09-18 HISTORY — PX: APPENDECTOMY: SHX54

## 2020-09-18 HISTORY — DX: Other complications of anesthesia, initial encounter: T88.59XA

## 2020-09-18 HISTORY — PX: LAPAROSCOPIC APPENDECTOMY: SHX408

## 2020-09-18 LAB — COMPREHENSIVE METABOLIC PANEL
ALT: 10 U/L (ref 0–44)
AST: 35 U/L (ref 15–41)
Albumin: 1.3 g/dL — ABNORMAL LOW (ref 3.5–5.0)
Alkaline Phosphatase: 154 U/L — ABNORMAL HIGH (ref 38–126)
Anion gap: 7 (ref 5–15)
BUN: 21 mg/dL (ref 8–23)
CO2: 23 mmol/L (ref 22–32)
Calcium: 7.3 mg/dL — ABNORMAL LOW (ref 8.9–10.3)
Chloride: 110 mmol/L (ref 98–111)
Creatinine, Ser: 1.41 mg/dL — ABNORMAL HIGH (ref 0.44–1.00)
GFR, Estimated: 37 mL/min — ABNORMAL LOW (ref >60)
Glucose, Bld: 224 mg/dL — ABNORMAL HIGH (ref 70–99)
Potassium: 4.3 mmol/L (ref 3.5–5.1)
Sodium: 140 mmol/L (ref 135–145)
Total Bilirubin: 1.1 mg/dL (ref 0.3–1.2)
Total Protein: 6.2 g/dL — ABNORMAL LOW (ref 6.5–8.1)

## 2020-09-18 LAB — CBC WITH DIFFERENTIAL/PLATELET
Abs Immature Granulocytes: 0.06 10*3/uL (ref 0.00–0.07)
Basophils Absolute: 0 10*3/uL (ref 0.0–0.1)
Basophils Relative: 0 %
Eosinophils Absolute: 0 10*3/uL (ref 0.0–0.5)
Eosinophils Relative: 0 %
HCT: 32.7 % — ABNORMAL LOW (ref 36.0–46.0)
Hemoglobin: 9.7 g/dL — ABNORMAL LOW (ref 12.0–15.0)
Immature Granulocytes: 1 %
Lymphocytes Relative: 9 %
Lymphs Abs: 0.7 10*3/uL (ref 0.7–4.0)
MCH: 29 pg (ref 26.0–34.0)
MCHC: 29.7 g/dL — ABNORMAL LOW (ref 30.0–36.0)
MCV: 97.6 fL (ref 80.0–100.0)
Monocytes Absolute: 0.4 10*3/uL (ref 0.1–1.0)
Monocytes Relative: 5 %
Neutro Abs: 6.8 10*3/uL (ref 1.7–7.7)
Neutrophils Relative %: 85 %
Platelets: UNDETERMINED 10*3/uL (ref 150–400)
RBC: 3.35 MIL/uL — ABNORMAL LOW (ref 3.87–5.11)
RDW: 15.3 % (ref 11.5–15.5)
WBC: 8 10*3/uL (ref 4.0–10.5)
nRBC: 0 % (ref 0.0–0.2)

## 2020-09-18 LAB — RESPIRATORY PANEL BY RT PCR (FLU A&B, COVID)
Influenza A by PCR: NEGATIVE
Influenza B by PCR: NEGATIVE
SARS Coronavirus 2 by RT PCR: NEGATIVE

## 2020-09-18 LAB — LACTIC ACID, PLASMA: Lactic Acid, Venous: 0.9 mmol/L (ref 0.5–1.9)

## 2020-09-18 LAB — LIPASE, BLOOD: Lipase: 14 U/L (ref 11–51)

## 2020-09-18 LAB — GLUCOSE, CAPILLARY
Glucose-Capillary: 191 mg/dL — ABNORMAL HIGH (ref 70–99)
Glucose-Capillary: 197 mg/dL — ABNORMAL HIGH (ref 70–99)

## 2020-09-18 SURGERY — APPENDECTOMY, LAPAROSCOPIC
Anesthesia: General | Site: Abdomen

## 2020-09-18 MED ORDER — MORPHINE SULFATE (PF) 2 MG/ML IV SOLN: 1.0000 mg | INTRAVENOUS | Status: DC | PRN

## 2020-09-18 MED ORDER — CHLORHEXIDINE GLUCONATE 0.12 % MT SOLN
15.0000 mL | Freq: Once | OROMUCOSAL | Status: AC
  Filled 2020-09-18: qty 15

## 2020-09-18 MED ORDER — SUCCINYLCHOLINE CHLORIDE 200 MG/10ML IV SOSY
PREFILLED_SYRINGE | INTRAVENOUS | Status: DC | PRN
  Administered 2020-09-18: 120 mg via INTRAVENOUS

## 2020-09-18 MED ORDER — BUPIVACAINE HCL (PF) 0.25 % IJ SOLN
INTRAMUSCULAR | Status: DC | PRN
  Administered 2020-09-18: 20 mL

## 2020-09-18 MED ORDER — LACTATED RINGERS IV SOLN: INTRAVENOUS | Status: DC

## 2020-09-18 MED ORDER — ACETAMINOPHEN 650 MG RE SUPP: 650.0000 mg | Freq: Four times a day (QID) | RECTAL | Status: DC | PRN

## 2020-09-18 MED ORDER — 0.9 % SODIUM CHLORIDE (POUR BTL) OPTIME
TOPICAL | Status: DC | PRN
  Administered 2020-09-18: 1000 mL

## 2020-09-18 MED ORDER — ACETAMINOPHEN 500 MG PO TABS
1000.0000 mg | ORAL_TABLET | ORAL | Status: AC
  Administered 2020-09-18: 1000 mg via ORAL
  Filled 2020-09-18: qty 2

## 2020-09-18 MED ORDER — MIDAZOLAM HCL 2 MG/2ML IJ SOLN
INTRAMUSCULAR | Status: AC
  Filled 2020-09-18: qty 2

## 2020-09-18 MED ORDER — SODIUM CHLORIDE 0.9 % IV SOLN
2.0000 g | Freq: Once | INTRAVENOUS | Status: AC
  Administered 2020-09-18: 2 g via INTRAVENOUS
  Filled 2020-09-18: qty 20

## 2020-09-18 MED ORDER — SODIUM CHLORIDE 0.9 % IR SOLN
Status: DC | PRN
  Administered 2020-09-18 (×2): 1000 mL

## 2020-09-18 MED ORDER — SODIUM CHLORIDE 0.9 % IV BOLUS
1000.0000 mL | Freq: Once | INTRAVENOUS | Status: AC
  Administered 2020-09-18: 1000 mL via INTRAVENOUS

## 2020-09-18 MED ORDER — ONDANSETRON HCL 4 MG/2ML IJ SOLN
4.0000 mg | Freq: Once | INTRAMUSCULAR | Status: AC
  Administered 2020-09-18: 4 mg via INTRAVENOUS
  Filled 2020-09-18: qty 2

## 2020-09-18 MED ORDER — FENTANYL CITRATE (PF) 250 MCG/5ML IJ SOLN
INTRAMUSCULAR | Status: DC | PRN
  Administered 2020-09-18 (×2): 25 ug via INTRAVENOUS

## 2020-09-18 MED ORDER — ONDANSETRON HCL 4 MG PO TABS: 4.0000 mg | ORAL_TABLET | Freq: Four times a day (QID) | ORAL | Status: DC | PRN

## 2020-09-18 MED ORDER — ONDANSETRON HCL 4 MG/2ML IJ SOLN
4.0000 mg | Freq: Four times a day (QID) | INTRAMUSCULAR | Status: DC | PRN
  Administered 2020-09-20 – 2020-09-28 (×4): 4 mg via INTRAVENOUS
  Filled 2020-09-18 (×5): qty 2

## 2020-09-18 MED ORDER — OXYCODONE HCL 5 MG PO TABS
5.0000 mg | ORAL_TABLET | ORAL | Status: DC | PRN
  Administered 2020-09-19 – 2020-09-26 (×6): 5 mg via ORAL
  Filled 2020-09-18 (×6): qty 1

## 2020-09-18 MED ORDER — MORPHINE SULFATE (PF) 4 MG/ML IV SOLN
4.0000 mg | Freq: Once | INTRAVENOUS | Status: AC
  Administered 2020-09-18: 4 mg via INTRAVENOUS
  Filled 2020-09-18: qty 1

## 2020-09-18 MED ORDER — GABAPENTIN 300 MG PO CAPS
300.0000 mg | ORAL_CAPSULE | ORAL | Status: AC
  Administered 2020-09-18: 300 mg via ORAL
  Filled 2020-09-18: qty 1

## 2020-09-18 MED ORDER — FENTANYL CITRATE (PF) 250 MCG/5ML IJ SOLN
INTRAMUSCULAR | Status: AC
  Filled 2020-09-18: qty 5

## 2020-09-18 MED ORDER — METRONIDAZOLE IN NACL 5-0.79 MG/ML-% IV SOLN
500.0000 mg | Freq: Once | INTRAVENOUS | Status: AC
  Administered 2020-09-18: 500 mg via INTRAVENOUS
  Filled 2020-09-18: qty 100

## 2020-09-18 MED ORDER — PROPOFOL 10 MG/ML IV BOLUS
INTRAVENOUS | Status: AC
  Filled 2020-09-18: qty 20

## 2020-09-18 MED ORDER — CHLORHEXIDINE GLUCONATE 0.12 % MT SOLN
OROMUCOSAL | Status: AC
  Administered 2020-09-18: 15 mL via OROMUCOSAL
  Filled 2020-09-18: qty 15

## 2020-09-18 MED ORDER — IOHEXOL 300 MG/ML  SOLN
75.0000 mL | Freq: Once | INTRAMUSCULAR | Status: AC | PRN
  Administered 2020-09-18: 65 mL via INTRAVENOUS

## 2020-09-18 MED ORDER — POLYETHYLENE GLYCOL 3350 17 G PO PACK: 17.0000 g | PACK | Freq: Every day | ORAL | Status: DC | PRN

## 2020-09-18 MED ORDER — INSULIN ASPART 100 UNIT/ML ~~LOC~~ SOLN
0.0000 [IU] | SUBCUTANEOUS | Status: DC
  Administered 2020-09-19 (×2): 1 [IU] via SUBCUTANEOUS
  Administered 2020-09-19: 2 [IU] via SUBCUTANEOUS
  Administered 2020-09-19: 3 [IU] via SUBCUTANEOUS
  Administered 2020-09-19 – 2020-09-21 (×9): 1 [IU] via SUBCUTANEOUS

## 2020-09-18 MED ORDER — ACETAMINOPHEN 500 MG PO TABS
1000.0000 mg | ORAL_TABLET | Freq: Four times a day (QID) | ORAL | Status: AC
  Administered 2020-09-18: 1000 mg via ORAL
  Filled 2020-09-18: qty 2

## 2020-09-18 MED ORDER — ACETAMINOPHEN 325 MG PO TABS: 650.0000 mg | ORAL_TABLET | Freq: Four times a day (QID) | ORAL | Status: DC | PRN

## 2020-09-18 MED ORDER — DEXAMETHASONE SODIUM PHOSPHATE 10 MG/ML IJ SOLN
INTRAMUSCULAR | Status: DC | PRN
  Administered 2020-09-18: 4 mg via INTRAVENOUS

## 2020-09-18 MED ORDER — PROPOFOL 10 MG/ML IV BOLUS
INTRAVENOUS | Status: DC | PRN
  Administered 2020-09-18: 70 mg via INTRAVENOUS

## 2020-09-18 MED ORDER — PHENYLEPHRINE 40 MCG/ML (10ML) SYRINGE FOR IV PUSH (FOR BLOOD PRESSURE SUPPORT)
PREFILLED_SYRINGE | INTRAVENOUS | Status: AC
  Filled 2020-09-18: qty 30

## 2020-09-18 MED ORDER — BUPIVACAINE HCL (PF) 0.25 % IJ SOLN
INTRAMUSCULAR | Status: AC
  Filled 2020-09-18: qty 30

## 2020-09-18 MED ORDER — ONDANSETRON HCL 4 MG/2ML IJ SOLN
INTRAMUSCULAR | Status: DC | PRN
  Administered 2020-09-18: 4 mg via INTRAVENOUS

## 2020-09-18 MED ORDER — PHENYLEPHRINE 40 MCG/ML (10ML) SYRINGE FOR IV PUSH (FOR BLOOD PRESSURE SUPPORT)
PREFILLED_SYRINGE | INTRAVENOUS | Status: DC | PRN
  Administered 2020-09-18 (×4): 80 ug via INTRAVENOUS

## 2020-09-18 SURGICAL SUPPLY — 39 items
APPLIER CLIP 5 13 M/L LIGAMAX5 (MISCELLANEOUS)
APPLIER CLIP ROT 10 11.4 M/L (STAPLE)
CANISTER SUCT 3000ML PPV (MISCELLANEOUS) ×3 IMPLANT
CHLORAPREP W/TINT 26 (MISCELLANEOUS) ×3 IMPLANT
CLIP APPLIE 5 13 M/L LIGAMAX5 (MISCELLANEOUS) IMPLANT
CLIP APPLIE ROT 10 11.4 M/L (STAPLE) IMPLANT
COVER SURGICAL LIGHT HANDLE (MISCELLANEOUS) ×3 IMPLANT
COVER WAND RF STERILE (DRAPES) IMPLANT
CUTTER FLEX LINEAR 45M (STAPLE) ×3 IMPLANT
DERMABOND ADVANCED (GAUZE/BANDAGES/DRESSINGS) ×2
DERMABOND ADVANCED .7 DNX12 (GAUZE/BANDAGES/DRESSINGS) ×1 IMPLANT
DRSG MEPILEX SACRM 8.7X9.8 (GAUZE/BANDAGES/DRESSINGS) ×3 IMPLANT
ELECT REM PT RETURN 9FT ADLT (ELECTROSURGICAL) ×3
ELECTRODE REM PT RTRN 9FT ADLT (ELECTROSURGICAL) ×1 IMPLANT
GLOVE SURG SIGNA 7.5 PF LTX (GLOVE) ×3 IMPLANT
GOWN STRL REUS W/ TWL LRG LVL3 (GOWN DISPOSABLE) ×2 IMPLANT
GOWN STRL REUS W/ TWL XL LVL3 (GOWN DISPOSABLE) ×1 IMPLANT
GOWN STRL REUS W/TWL LRG LVL3 (GOWN DISPOSABLE) ×4
GOWN STRL REUS W/TWL XL LVL3 (GOWN DISPOSABLE) ×2
KIT BASIN OR (CUSTOM PROCEDURE TRAY) ×3 IMPLANT
KIT TURNOVER KIT B (KITS) ×3 IMPLANT
NS IRRIG 1000ML POUR BTL (IV SOLUTION) ×3 IMPLANT
PAD ARMBOARD 7.5X6 YLW CONV (MISCELLANEOUS) ×6 IMPLANT
POUCH SPECIMEN RETRIEVAL 10MM (ENDOMECHANICALS) ×3 IMPLANT
RELOAD 45 VASCULAR/THIN (ENDOMECHANICALS) IMPLANT
RELOAD STAPLE TA45 3.5 REG BLU (ENDOMECHANICALS) ×3 IMPLANT
SET IRRIG TUBING LAPAROSCOPIC (IRRIGATION / IRRIGATOR) ×3 IMPLANT
SET TUBE SMOKE EVAC HIGH FLOW (TUBING) ×3 IMPLANT
SHEARS HARMONIC ACE PLUS 36CM (ENDOMECHANICALS) ×3 IMPLANT
SLEEVE ENDOPATH XCEL 5M (ENDOMECHANICALS) ×3 IMPLANT
SPECIMEN JAR SMALL (MISCELLANEOUS) ×3 IMPLANT
SUT MON AB 4-0 PC3 18 (SUTURE) ×3 IMPLANT
SUT VICRYL 0 UR6 27IN ABS (SUTURE) ×3 IMPLANT
TOWEL GREEN STERILE (TOWEL DISPOSABLE) ×3 IMPLANT
TOWEL GREEN STERILE FF (TOWEL DISPOSABLE) ×3 IMPLANT
TRAY LAPAROSCOPIC MC (CUSTOM PROCEDURE TRAY) ×3 IMPLANT
TROCAR XCEL BLUNT TIP 100MML (ENDOMECHANICALS) ×3 IMPLANT
TROCAR XCEL NON-BLD 5MMX100MML (ENDOMECHANICALS) ×3 IMPLANT
WATER STERILE IRR 1000ML POUR (IV SOLUTION) ×3 IMPLANT

## 2020-09-18 NOTE — Transfer of Care (Signed)
Immediate Anesthesia Transfer of Care Note  Patient: Whitney [name]  Procedure(s) Performed: APPENDECTOMY LAPAROSCOPIC (N/A Abdomen)  Patient Location: PACU  Anesthesia Type:General  Level of Consciousness: drowsy and patient cooperative  Airway & Oxygen Therapy: Patient Spontanous Breathing and Patient connected to face mask oxygen  Post-op Assessment: Report given to RN and Post -op Vital signs reviewed and stable  Post vital signs: Reviewed  Last Vitals:  Vitals Value Taken Time  BP 127/62 09/18/20 1558  Temp    Pulse 101 09/18/20 1613  Resp 20 09/18/20 1613  SpO2 100 % 09/18/20 1613  Vitals shown include unvalidated device data.  Last Pain:  Vitals:   09/18/20 1600  TempSrc:   PainSc: Asleep         Complications: No complications documented.

## 2020-09-18 NOTE — OR Nursing (Signed)
Pt is awake,alert and oriented.Pt and/or family verbalized understanding of poc and discharge instructions. Family at bedside. Reviewed admission and on going care with receiving RN. Pt is in NAD at this time and is ready to be transferred to floor. Will con't to monitor until pt is transferred. Pt on 02 at 2l/min [place] Pt on Monitor NSR noted  Pt foley in place with order, educated receiving nurse pt had mucous/pus noted when it was placed pt has hx of urospesis.

## 2020-09-18 NOTE — Anesthesia Preprocedure Evaluation (Addendum)
Anesthesia Evaluation  Patient identified by MRN, date of birth, ID band Patient awake    Reviewed: Allergy & Precautions, NPO status , Patient's Chart, lab work & pertinent test results  Airway Mallampati: I  TM Distance: >3 FB Neck ROM: Full    Dental  (+) Missing, Poor Dentition, Chipped, Loose, Dental Advisory Given,    Pulmonary neg pulmonary ROS,    breath sounds clear to auscultation       Cardiovascular negative cardio ROS   Rhythm:Regular Rate:Normal  ECG: SR, rate 99   Neuro/Psych negative neurological ROS     GI/Hepatic Neg liver ROS, GERD  Medicated,  Endo/Other  diabetes, Type 2, Insulin Dependent  Renal/GU Renal disease     Musculoskeletal negative musculoskeletal ROS (+)   Abdominal (+) + obese,   Peds  Hematology  (+) anemia ,   Anesthesia Other Findings ACUTE APPENDICITIS  Reproductive/Obstetrics                           Anesthesia Physical Anesthesia Plan  ASA: II  Anesthesia Plan: General   Post-op Pain Management:    Induction: Intravenous, Rapid sequence and Cricoid pressure planned  PONV Risk Score and Plan: 4 or greater and Ondansetron, Dexamethasone, Propofol infusion and Treatment may vary due to age or medical condition  Airway Management Planned: Oral ETT  Additional Equipment:   Intra-op Plan:   Post-operative Plan: Extubation in OR  Informed Consent: I have reviewed the patients History and Physical, chart, labs and discussed the procedure including the risks, benefits and alternatives for the proposed anesthesia with the patient or authorized representative who has indicated his/her understanding and acceptance.     Consent reviewed with POA and Dental advisory given  Plan Discussed with: CRNA  Anesthesia Plan Comments: (Echo: 1. Abnormal septal motion . Left ventricular ejection fraction, by  estimation, is 55 to 60%. The left ventricle  has normal function. The left  ventricle has no regional wall motion abnormalities. Left ventricular  diastolic parameters were normal.  2. Right ventricular systolic function is normal. The right ventricular  size is normal.  3. The mitral valve is degenerative. Mild to moderate mitral valve  regurgitation. No evidence of mitral stenosis.  4. The aortic valve is tricuspid. Aortic valve regurgitation is not  visualized. Mild to moderate aortic valve sclerosis/calcification is  present, without any evidence of aortic stenosis.  5. The inferior vena cava is normal in size with greater than 50%  respiratory variability, suggesting right atrial pressure of 3 mmHg. )      Anesthesia Quick Evaluation

## 2020-09-18 NOTE — Anesthesia Procedure Notes (Signed)
Procedure Name: Intubation Date/Time: 09/18/2020 2:42 PM Performed by: Trinna Post., CRNA Pre-anesthesia Checklist: Patient identified, Emergency Drugs available, Suction available, Patient being monitored and Timeout performed Patient Re-evaluated:Patient Re-evaluated prior to induction Oxygen Delivery Method: Circle system utilized Preoxygenation: Pre-oxygenation with 100% oxygen Induction Type: IV induction, Rapid sequence and Cricoid Pressure applied Ventilation: Mask ventilation without difficulty Laryngoscope Size: Mac and 3 Grade View: Grade I Tube type: Oral Tube size: 7.0 mm Number of attempts: 1 Airway Equipment and Method: Stylet Placement Confirmation: ETT inserted through vocal cords under direct vision,  positive ETCO2 and breath sounds checked- equal and bilateral Secured at: 22 cm Tube secured with: Tape Dental Injury: Teeth and Oropharynx as per pre-operative assessment

## 2020-09-18 NOTE — ED Triage Notes (Signed)
Pt from home with EMS; report abd pain, nausea, and vomiting, starting at 2100.  Pt discharged Oct 20 and has not eaten since then.

## 2020-09-18 NOTE — H&P (Signed)
Date: 09/18/2020               Patient Name:  Whitney [name] MRN: [id number]  DOB: 1935-08-23 Age / Sex: 85 y.o., female   PCP: Patient, No Pcp Per         Medical Service: Internal Medicine Teaching Service         Attending Physician: Dr. Aldine Contes, MD    First Contact: Dr. Cato Mulligan Pager: [phone]  Second Contact: Dr. Mitzi Hansen Pager: [phone]       After Hours (After 5p/  First Contact Pager: [phone]  weekends / holidays): Second Contact Pager: [phone]   Chief Complaint: Abdominal pain  History of Present Illness:  Ms.Frater is a 85 yo F w/ PMH of IDDm, CKD3, Hypoalbuminemia, Chronic R hip fracture and recent admission for pyelonephritis with ESBL kleb bacteremia presenting to [facility] with abdominal pain. She was noted to somnolent. History obtained with assistance from her daughter who is her primary caretaker. She was in her usual state of health until yesterday around 10pm when family was watching a movie together and she had acute onset abdominal pain. Pain was accompanied with nausea and NBNB emesis. Initially family thought the pain would resolve on its own overnight but her abdominal pain gradually worsened until this morning when she began to appear more somnolent and fatigued so she was brought to ED for evaluation.  On review of systems, she had an episode of loose bowels but denies any diarrhea, constipation. Denies any fevers, chills, dyspnea, chest pain, palpitations. Does endorse orthopnea and lower extremity edema but this is chronic.  On chart review, she was recently discharged on 08/31/20 after prolonged hospitalization for Klebsiella bacteremia with pyelonephritis requiring left ureteral stent. She mentions having intermittent episodes of dysuria after discharge although this morning denied any dysuria, frequency or urgency.  Meds: Acetaminophen 500mg  q8hr PRN Calcitonin nasal spray Famotidine 20mg  daily Ibuprofen 200mg  daily Lantus 5 units  qhs Tamsulosin 0.4mg  daily Vitamind 5 1.25mg  capsule daily  Allergies: Allergies as of 09/18/2020  . (No Known Allergies)   Past Medical History:  Diagnosis Date  . CKD (chronic kidney disease)   . Diabetes mellitus without complication (HCC)    Family History: Both parents lives to be in the 10s. Aunt had some kind of cancer.  Social History: Lives at home with daughter. Depends on daughter for most ADLs and IADLs. Used to be wheelchair bound but now is currently bed bound. Denies any alcohol, tobacco, substance use.  Review of Systems: A complete ROS was negative except as per HPI.  Physical Exam: Blood pressure (!) 161/65, pulse 96, temperature 98 F (36.7 C), temperature source Oral, resp. rate 17, height 5\' 3"  (1.6 m), weight 90 kg, SpO2 96 %.  Gen: Well-developed, well nourished, chronically ill-appearing HEENT: NCAT head, hearing intact, EOMI, Poor dentition, MMM Neck: supple, ROM intact CV: RRR, S1, S2 normal, No rubs, no murmurs, no gallops Pulm: Bibasilar rales, no wheezes Abd: Soft, Hypoactive bowel sounds, diffuse tenderness to palpation, reducible ventral hernia. Extm: 2+ pitting edema up to lower abdomen, RLE immobile Skin: Dry, Warm, normal turgor, chronic venous stasis changes bilateral. RLE pressure ulcer with bloody drainage Neuro: Somnolent  EKG: personally reviewed my interpretation is low amplitude ekg, normal sinus, prolonged QT, no ischemic changes   CXR: N/A  Assessment & Plan by Problem: Active Problems:   Appendicitis  Ms.Golla is a 85 yo F w/ PMH of IDDm, CKD3, Hypoalbuminemia, Chronic R hip fracture  and recent admission for pyelonephritis with ESBL kleb bacteremia presenting to [facility] with abdominal pain due to appendicitis  N/V/Abdominal pain 2/2 Appendicitis Present with abdominal pain of 1 day duration. Afebrile. Wbc 8.0. CT abd/pelvis showing evidence of early appendicitis. Surgery on board and family requesting surgery. Had prolonged  conversation with daughter myself regarding patient's comorbidities and volume status and risk of complications with surgery and alternative option of treating with IV antibiotics but patient's daughter insists on proceeding with surgery. Received dose of ceftriaxone and metronidazole in ED. High risk of decompensation post-op due to comorbidities and current volume status. Admit to inpatient. - Appreciate surgery recs: OR later today for laparoscopic appendicitis. - No need for antibiotics post-op unless surgical complications arise - Trend cbc - Monitor volume status closely post-op  Anasarca Hypoalbuminemia Noted to be significant hypervolemic on exam. Multiple work-up performed on prior admission with CHF, Proteinuria, Cirrhosis ruled out. Thought to be due to severe protein malnutrition with chronically low albumin at 1.2 and started on high protein diet at discharge. On admission noted to have albumin of 1.3 with anasarca on exam. Will need albumin replacement post-op. - Reassess volume status post-op - Furosemide with albumin if continues to be hypervolemic - Trend cmp - Dietary consult  Goals of care conversation Had discussion regarding Ms.Quigley's frailty and high risk of complications with surgery. Also discussed code status. Ms.Escoto's daughter Caro Hight mentions that while she has been sick requiring frequent hospitalizations, she wants to respect Ms.Dubberly's wishes and she had expressed wanting 'everything done' recently which includes CPR and any interventions necessary, such as surgery. She mentions that although she was previously DNR, she wants her code status changed to Full and while her risk of complications are high, she recovered from her prior hospitalization and is hopeful she will recover again. - C/w to discuss goals of care  Chronic Kidney Disease 3a Baseline bun 35 Creatinine 1.28. Admit creatinine 1.41. At baseline - Monitor renal fx - Avoid nephrotoxic meds when  able  Diabetes Mellitus On lantus 5 units qhs only. Currently NPO for OR. Cbg 224 - SSI - Glucose checks  DVT prophx: SCDs Diet: NPO Bowel: N/A Code: Full  Prior to Admission Living Arrangement: Home Anticipated Discharge Location: Home Barriers to Discharge: Medical treatment  Dispo: Admit patient to Inpatient with expected length of stay greater than 2 midnights.  Signed: Mosetta Anis, MD 09/18/2020, 1:23 PM  Pager: [phone]

## 2020-09-18 NOTE — OR Nursing (Signed)
Pt in room, family at bedside, nurse in room, monitor placed on patient, tele box identified and verified with this nurse and receiving nurse.

## 2020-09-18 NOTE — ED Provider Notes (Signed)
[facility] EMERGENCY DEPARTMENT Provider Note   CSN: [id number] Arrival date & time: 09/18/20  0535     History Chief Complaint  Patient presents with  . Abdominal Pain  . Nausea  . Emesis    Whitney [name] is a 85 y.o. female.   Abdominal Pain Pain location:  Epigastric and RUQ Pain quality: aching   Pain radiates to:  Does not radiate Pain severity:  Moderate Onset quality:  Gradual Timing:  Constant Progression:  Worsening Chronicity:  New Context comment:  After meal Relieved by:  Nothing Worsened by:  Nothing Ineffective treatments:  None tried Associated symptoms: anorexia, nausea and vomiting   Associated symptoms: no chest pain, no chills, no cough, no diarrhea, no dysuria, no fever, no melena and no shortness of breath   Emesis Associated symptoms: abdominal pain   Associated symptoms: no arthralgias, no chills, no cough, no diarrhea, no fever and no headaches        Past Medical History:  Diagnosis Date  . Diabetes mellitus without complication [facility])     Patient Active Problem List   Diagnosis Date Noted  . Bacteremia due to Klebsiella pneumoniae 08/22/2020  . Abdominal distention   . Acute cystitis with hematuria   . Hip pain   . Leukocytosis   . Pressure injury of skin 08/18/2020  . Sepsis ([place]) 08/17/2020  . Pyelitis 08/17/2020  . Dehydration   . Pain   . Goals of care, counseling/discussion   . Advanced care planning/counseling discussion   . Palliative care by specialist   . Closed right hip fracture, initial encounter ([place]) 03/03/2020  . Diabetes mellitus without complication ([place])   . CKD (chronic kidney disease), stage III ([place])   . Closed right hip fracture ([place])   . Protein calorie malnutrition ([place])   . Bilateral leg edema   . Intractable pain 03/02/2020    Past Surgical History:  Procedure Laterality Date  . CYSTOSCOPY W/ URETERAL STENT PLACEMENT Left 08/18/2020   Procedure: CYSTOSCOPY WITH RETROGRADE  PYELOGRAM/URETERAL STENT PLACEMENT;  Surgeon: [name], MD;  Location: [place];  Service: Urology;  Laterality: Left;  . IR FLUORO GUIDE CV LINE RIGHT  08/23/2020  . IR REMOVAL TUN CV CATH W/O FL  08/31/2020  . IR US GUIDE VASC ACCESS RIGHT  08/23/2020  . TOTAL HIP ARTHROPLASTY Right 03/05/2020   Procedure: HIP GIRDLE STONE;  Surgeon: Leandrew Koyanagi, MD;  Location: [place];  Service: Orthopedics;  Laterality: Right;     OB History   No obstetric history on file.     No family history on file.  Social History   Tobacco Use  . Smoking status: Never Smoker  . Smokeless tobacco: Never Used  Substance Use Topics  . Alcohol use: Never  . Drug use: Never    Home Medications Prior to Admission medications   Medication Sig Start Date End Date Taking? Authorizing Provider  acetaminophen (TYLENOL) 500 MG tablet Take 2 tablets by mouth every 8 (eight) hours as needed for moderate pain. Patient not taking: Reported on 05/27/2020 02/28/20   [provider]  calcitonin, salmon, (MIACALCIN/FORTICAL) 200 UNIT/ACT nasal spray Place 1 spray into alternate nostrils daily. Patient not taking: Reported on 09/18/2020 03/30/20   Geradine Girt, DO  famotidine (PEPCID) 20 MG tablet Take 1 tablet (20 mg total) by mouth 2 (two) times daily. Patient not taking: Reported on 05/08/2020 03/30/20   Geradine Girt, DO  ibuprofen (ADVIL) 200 MG tablet Take  200-400 mg by mouth daily as needed for headache (pain). Patient not taking: Reported on 09/18/2020    [provider]  insulin glargine (LANTUS) 100 UNIT/ML Solostar Pen Inject 5 Units into the skin at bedtime. Patient not taking: Reported on 09/18/2020 04/12/20   Argentina Donovan, PA-C  Insulin Pen Needle (PEN NEEDLES) 30G X 5 MM MISC Use as instructed. 04/26/20   Argentina Donovan, PA-C  Multiple Vitamin (MULTIVITAMIN WITH MINERALS) TABS tablet Take 1 tablet by mouth daily. Patient not taking: Reported on 09/18/2020    [provider]    oxyCODONE-acetaminophen (PERCOCET) 5-325 MG tablet Take 1-2 tablets by mouth every 8 (eight) hours as needed for severe pain. Patient not taking: Reported on 04/12/2020 03/05/20   Leandrew Koyanagi, MD  tamsulosin (FLOMAX) 0.4 MG CAPS capsule Take 1 capsule (0.4 mg total) by mouth daily. Patient not taking: Reported on 09/18/2020 09/01/20   Virl Axe, MD  Vitamin D, Ergocalciferol, (DRISDOL) 1.25 MG (50000 UNIT) CAPS capsule Take 1 capsule (50,000 Units total) by mouth every 7 (seven) days. Patient not taking: Reported on 09/18/2020 03/30/20   Geradine Girt, DO    Allergies    Patient has no known allergies.  Review of Systems   Review of Systems  Constitutional: Negative for chills and fever.  HENT: Negative for congestion and rhinorrhea.   Respiratory: Negative for cough and shortness of breath.   Cardiovascular: Negative for chest pain and palpitations.  Gastrointestinal: Positive for abdominal pain, anorexia, nausea and vomiting. Negative for diarrhea and melena.  Genitourinary: Negative for difficulty urinating and dysuria.  Musculoskeletal: Negative for arthralgias and back pain.  Skin: Negative for rash and wound.  Neurological: Negative for light-headedness and headaches.    Physical Exam Updated Vital Signs BP (!) 145/67 (BP Location: Right Arm)   Pulse 97   Temp (!) 97.3 F (36.3 C) (Axillary)   Resp (!) 23   SpO2 94%   Physical Exam Vitals reviewed. Exam conducted with a chaperone present.  Constitutional:      General: She is not in acute distress.    Appearance: Normal appearance.  HENT:     Head: Normocephalic and atraumatic.     Nose: No rhinorrhea.  Eyes:     General:        Right eye: No discharge.        Left eye: No discharge.     Conjunctiva/sclera: Conjunctivae normal.  Cardiovascular:     Rate and Rhythm: Normal rate and regular rhythm.  Pulmonary:     Effort: Pulmonary effort is normal. No respiratory distress.     Breath sounds: No stridor.   Abdominal:     General: Abdomen is flat. There is no distension.     Palpations: Abdomen is soft.     Tenderness: There is generalized abdominal tenderness. There is guarding and rebound.  Musculoskeletal:        General: No tenderness or signs of injury.  Skin:    General: Skin is warm and dry.  Neurological:     General: No focal deficit present.     Mental Status: She is alert. Mental status is at baseline.     Motor: No weakness.  Psychiatric:        Mood and Affect: Mood normal.        Behavior: Behavior normal.     ED Results / Procedures / Treatments   Labs (all labs ordered are listed, but only abnormal results are displayed) Labs  Reviewed  CBC WITH DIFFERENTIAL/PLATELET  COMPREHENSIVE METABOLIC PANEL  LACTIC ACID, PLASMA  LACTIC ACID, PLASMA  LIPASE, BLOOD  URINALYSIS, ROUTINE W REFLEX MICROSCOPIC    EKG None  Radiology No results found.  Procedures Procedures (including critical care time)  Medications Ordered in ED Medications  sodium chloride 0.9 % bolus 1,000 mL (1,000 mLs Intravenous New Bag/Given 09/18/20 0605)  morphine 4 MG/ML injection 4 mg (4 mg Intravenous Given 09/18/20 0616)  ondansetron (ZOFRAN) injection 4 mg (4 mg Intravenous Given 09/18/20 0616)  iohexol (OMNIPAQUE) 300 MG/ML solution 75 mL (65 mLs Intravenous Contrast Given 09/18/20 0355)    ED Course  I have reviewed the triage vital signs and the nursing notes.  Pertinent labs & imaging results that were available during my care of the patient were reviewed by me and considered in my medical decision making (see chart for details).    MDM Rules/Calculators/A&P                          Abdominal pain vomiting.  Sudden onset after eating.  Doing well after recovery from recent infection and bladder instrumentation.  Diffuse abdominal pain with guarding and rebound.  Will get CT imaging lab studies IV fluids pain control.  Family was offered interpreter but refused and would prefer to  use their family for Arabic interpretation.  Pt care was handed off to on coming provider at 0730.  Complete history and physical and current plan have been communicated.  Please refer to their note for the remainder of ED care and ultimate disposition.  Pt seen in conjunction with Dr. Jeanell Sparrow  Final Clinical Impression(s) / ED Diagnoses Final diagnoses:  None    Rx / DC Orders ED Discharge Orders    None       Breck Coons, MD 09/18/20 [phone]

## 2020-09-18 NOTE — ED Notes (Signed)
Noticed Pt's pulse Ox was 78% on central monitor. Went to check on Pt. She was sleeping but easily woken. Pulse Ox went up to 86%, but this tech did put Pt on 2L Nasal O2.

## 2020-09-18 NOTE — Consult Note (Signed)
Whitney [name] 1935-08-31  [id number].    Requesting MD: Dr. 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  She is followed by the teaching service as well as urology and will require stent exchange on 09/27/20.    She presented to the [facility] today with acute onset RLQ abdominal pain. The pain started last night around 2200.  Associated with multiple episodes of n/v. She tried taking pain medication but this did not help. Denies fever or chills. Never had pain like this before. She denies any CP, SOB, chills, fevers, diarrhea.  Her pain persisted so she came to the ED.  She presented to the [facility] where she is found to have a CT scan concerning for retrocecal early appendicitis.  She has a normal WBC, Hgb 9.7, Cr 1.41.  We have been asked to see her for further evaluation and recommendations.  No prior h/o abdominal surgery Nonsmoker NKDA Completed covid vaccine series about 2 months ago  ROS: ROS: Please see HPI, otherwise all other systems have been reviewed and are negative.  No family history on file.  Past Medical History:  Diagnosis Date  . CKD (chronic kidney disease)   . Diabetes mellitus without complication [facility])     Past Surgical History:  Procedure Laterality Date  . CYSTOSCOPY W/ URETERAL STENT PLACEMENT Left 08/18/2020   Procedure: CYSTOSCOPY WITH RETROGRADE PYELOGRAM/URETERAL STENT PLACEMENT;  Surgeon: [name], MD;  Location: [place];  Service: Urology;  Laterality: Left;  . IR FLUORO GUIDE CV LINE RIGHT  08/23/2020  . IR REMOVAL TUN CV CATH W/O FL  08/31/2020  . IR US GUIDE VASC ACCESS RIGHT  08/23/2020  . TOTAL HIP ARTHROPLASTY Right 03/05/2020    Procedure: HIP GIRDLE STONE;  Surgeon: Leandrew Koyanagi, MD;  Location: [place];  Service: Orthopedics;  Laterality: Right;    Social History:  reports that she has never smoked. She has never used smokeless tobacco. She reports that she does not drink alcohol and does not use drugs.  Allergies: No Known Allergies  (Not in a hospital admission)    Physical Exam: Blood pressure (!) 174/83, pulse 98, temperature (!) 97.3 F (36.3 C), temperature source Axillary, resp. rate (!) 21, SpO2 100 %. General: frail elderly Arabic female who is laying in bed in NAD HEENT: head is normocephalic, atraumatic.  Sclera are noninjected.  PERRL.  Ears and nose without any masses or lesions.  Mouth is dry, poor dentition  Heart: regular, rate, and rhythm.  Normal s1,s2. No obvious murmurs, gallops, or rubs noted. Feet WWP bilaterally Lungs: decreased breath sounds bilateral bases, no wheezes, rhonchi, or rales noted.  Respiratory effort nonlabored Abd: soft, ND, +BS, no masses or organomegaly. Significant focal tenderness RLQ without peritonitis MS: all 4 extremities are symmetrical with no cyanosis or clubbing. 1-2+ edema BLE Skin: warm and dry with no masses, lesions, or rashes Neuro: Cranial nerves 2-12 grossly intact, sensation is normal throughout Psych: A&Ox3 with an appropriate affect.   Results for orders placed or performed during the hospital encounter of 09/18/20 (from the past 48 hour(s))  CBC with Differential     Status: Abnormal   Collection Time: 09/18/20  8:00 AM  Result Value Ref Range   WBC 8.0 4.0 - 10.5 K/uL   RBC 3.35 (L) 3.87 - 5.11 MIL/uL   Hemoglobin 9.7 (L) 12.0 - 15.0 g/dL   HCT 32.7 (L) 36 - 46 %   MCV 97.6 80.0 - 100.0 fL   MCH 29.0 26.0 - 34.0 pg   MCHC 29.7 (L) 30.0 - 36.0 g/dL   RDW 15.3 11.5 - 15.5 %   Platelets PLATELET CLUMPS NOTED ON SMEAR, UNABLE TO ESTIMATE 150 - 400 K/uL    Comment: Immature Platelet Fraction may be clinically indicated, consider ordering this  additional test [id number]    nRBC 0.0 0.0 - 0.2 %   Neutrophils Relative % 85 %   Neutro Abs 6.8 1.7 - 7.7 K/uL   Lymphocytes Relative 9 %   Lymphs Abs 0.7 0.7 - 4.0 K/uL   Monocytes Relative 5 %   Monocytes Absolute 0.4 0.1 - 1.0 K/uL   Eosinophils Relative 0 %   Eosinophils Absolute 0.0 0.0 - 0.5 K/uL   Basophils Relative 0 %   Basophils Absolute 0.0 0.0 - 0.1 K/uL   Immature Granulocytes 1 %   Abs Immature Granulocytes 0.06 0.00 - 0.07 K/uL    Comment: Performed at [facility], [address] [place], [place] [address]  Comprehensive metabolic panel     Status: Abnormal   Collection Time: 09/18/20  8:00 AM  Result Value Ref Range   Sodium 140 135 - 145 mmol/L   Potassium 4.3 3.5 - 5.1 mmol/L    Comment: SLIGHT HEMOLYSIS   Chloride 110 98 - 111 mmol/L   CO2 23 22 - 32 mmol/L   Glucose, Bld 224 (H) 70 - 99 mg/dL    Comment: Glucose reference range applies only to samples taken after fasting for at least 8 hours.   BUN 21 8 - 23 mg/dL   Creatinine, Ser 1.41 (H) 0.44 - 1.00 mg/dL   Calcium 7.3 (L) 8.9 - 10.3 mg/dL   Total Protein 6.2 (L) 6.5 - 8.1 g/dL   Albumin 1.3 (L) 3.5 - 5.0 g/dL   AST 35 15 - 41 U/L   ALT 10 0 - 44 U/L   Alkaline Phosphatase 154 (H) 38 - 126 U/L   Total Bilirubin 1.1 0.3 - 1.2 mg/dL   GFR, Estimated 37 (L) >60 mL/min    Comment: (NOTE) Calculated using the CKD-EPI Creatinine Equation (2021)    Anion gap 7 5 - 15    Comment: Performed at [facility], [place] [address], [place], [place] [address]  Lactic acid, plasma     Status: None   Collection Time: 09/18/20  8:00 AM  Result Value Ref Range   Lactic Acid, Venous 0.9 0.5 - 1.9 mmol/L    Comment: Performed at [facility] [address] [place], [place] [address]  Lipase, blood     Status: None   Collection Time: 09/18/20  8:00 AM  Result Value Ref Range   Lipase 14 11 - 51 U/L    Comment: Performed at [facility], [place] [address], [place], [place] [address]   CT  ABDOMEN PELVIS W CONTRAST  Result Date: 09/18/2020 CLINICAL DATA:  Abdominal pain, nausea, vomiting EXAM: CT ABDOMEN AND PELVIS WITH CONTRAST TECHNIQUE: Multidetector CT imaging of the abdomen and pelvis was performed using the standard protocol following bolus administration of intravenous contrast. CONTRAST:  66mL OMNIPAQUE IOHEXOL 300 MG/ML  SOLN COMPARISON:  08/20/2020 FINDINGS: Lower chest: Coronary  calcifications. Increase in small bilateral pleural effusions. Stable bleb at the right lung base. Increase in interstitial opacities in the lung bases, and patchy consolidation/atelectasis posteriorly at the right lung base. Hepatobiliary: Mildly nodular hepatic contour without focal lesion. Several partially calcified stones in the dependent aspect of the lumen of the mildly distended gallbladder. No biliary ductal dilatation. Pancreas: Advanced parenchymal atrophy without focal lesion or ductal dilatation. Spleen: Normal in size without focal abnormality. Adrenals/Urinary Tract: Adrenal glands unremarkable. Focal parenchymal loss in the upper pole right kidney, stable. Left double-J ureteral stent in good position. Mild fullness of the central renal collecting system and proximal ureterectasis without overt hydronephrosis. Urinary bladder is distended containing small amount of gas suggesting recent instrumentation. 2.7 cm exophytic probable cyst from the lower pole left kidney. Stomach/Bowel: The stomach is nondilated. The small bowel is decompressed. Appendix: Location: Retrocecal Diameter: 9 mm Appendicolith: None seen Mucosal hyper-enhancement: Indeterminate Extraluminal gas: None seen Periappendiceal collection: No loculated collection The colon is non distended. There is fecal dilatation of the rectum up to 7.9 cm. Vascular/Lymphatic: Aorta unremarkable. Infrarenal IVC filter with caval wall penetration by filter legs involving the aorta and L3 vertebral body. Stable subcentimeter left para-aortic and  aortocaval lymph nodes. For no pelvic adenopathy. Prominent supraceliac and gastrohepatic ligament lymph nodes measuring up to 0.9 cm short axis diameter. Reproductive: Stable coarse uterine calcifications. No adnexal mass. Other: New small volume abdominal ascites predominantly perihepatic, perisplenic, and left lower quadrant. No free air. Musculoskeletal: Ventral hernia containing only mesenteric fat. Stable 3.8 cm presumed injection granuloma posterior to the left SI joint. Stable L1 and L3 compression deformities. Chronic fracture-dislocation of the right hip resorption of much of the femoral head. Left hip arthroplasty components are partially visualized, with chronic protrusio and attempts at cement restoration. No acute fracture or worrisome bone lesion. IMPRESSION: 1. Dilated appendix with equivocal mucosal hyper-enhancement, may represent early appendicitis, without evidence of perforation or abscess. 2. New small volume abdominal ascites. 3. Increase in small bilateral pleural effusions. 4. Cholelithiasis. 5. Coronary calcifications. 6. Additional chronic findings as above. Aortic Atherosclerosis (ICD10-I70.0). Electronically Signed   By: Lucrezia Europe M.D.   On: 09/18/2020 08:17      Assessment/Plan CKD - cr 1.4 DM Osteoporosis Bedbound Chronically dislocated right hip Recent Klebsiella bacteremia due to emphysematous cystitis with uretal stent in place  Appendicitis -covid test pending -patient with acute onset RLQ pain, n/v, and CT scan findings concerning for retrocecal early appendicitis.  She has a normal WBC. She does not have an appendicolith so it could be reasonable to attempt nonoperative management with antibiotics, and if she fails proceed with appy. Risks and benefits to surgery were discussed with the patient and her daughter and they would prefer to proceed with surgery. They understand the risks of general anesthesia, MI, stroke, death, infection, bleeding, prolonged  hospitalization, difficulty coming off the ventilator postop. Will plan to proceed with surgery today pending covid test. Keep NPO. IV antibiotics have been ordered.  - recommend medical admission   ID - rocephin/flagyl FEN - IVF, NPO VTE - SCDs only for now for surgery Foley - none Follow up - TBD  Wellington Hampshire, [facility] 09/18/2020, 10:51 AM Please see Amion for pager number during day hours 7:00am-4:30pm or 7:00am -11:30am on weekends

## 2020-09-18 NOTE — ED Provider Notes (Signed)
85 yo female ho gb stones, Presented loc with ruq pain and diffuse ttp. She has recently been admitted with esbi emphysematous cystitis and left emphysematouts pyelitis Physical Exam  BP (!) 120/43   Pulse (!) 101   Temp (!) 97.3 F (36.3 C) (Axillary)   Resp (!) 21   SpO2 94%   Physical Exam  ED Course/Procedures     Procedures  MDM  CT pending. Will recheck and assess after all labs and imaging returned.  Level 5 caveat secondary to language barrier history obtained through daughter at family request no translator used used Patient received a handoff from Dr. Ron Parker  85 year old female who presents today with abdominal pain.  Daughter states pain began around 10 PM last night after she had something to eat and she began vomiting.  She is continued with multiple episodes of emesis and complaining of pain through the night. Vital signs are stable Abdomen is soft with tenderness to palpation in right upper quadrant with maximal tenderness in right lower quadrant. CT reviewed and question of appendicitis noted. CBC and urinalysis still pending Plan consult to general surgery General surgery saw and evaluated- likely to OR, abx ordered  Discussed with IM and they will see for admission     Pattricia Boss, MD 09/18/20 1144

## 2020-09-18 NOTE — Op Note (Signed)
APPENDECTOMY LAPAROSCOPIC  Procedure Note  Whitney [name] 09/18/2020   Pre-op Diagnosis: ACUTE APPENDICITIS     Post-op Diagnosis: same  Procedure(s): APPENDECTOMY LAPAROSCOPIC  Surgeon(s): Coralie Keens, MD  Anesthesia: General  Staff:  Circulator: Rometta Emery, RN; [place], Karrie S, RN Scrub Person: Rosanne Sack, RN; Andrey Campanile, [place], RN  Estimated Blood Loss: Minimal               Specimens: sent to path  Indications: This is an 85 year old female with multiple comorbidities who presented to the emergency department with right lower quadrant abdominal pain.  She had a CT scan showing findings consistent with early acute appendicitis.  She did have some nodularity to her liver and some fluid in the abdomen as well.  The decision was made to proceed with appendectomy  Findings: The patient had a mildly inflamed appendix.  There was some fibrinous exudate in the right lower quadrant and a moderate amount of thin white-colored fluid in the right lower quadrant as well as above the liver.  The liver had findings consistent with cirrhosis.  Procedure: The patient was brought to the operating room and identifies correct patient.  She is placed upon the operating table and general anesthesia was induced.  Her abdomen was then prepped and draped in usual sterile fashion.  I made a vertical incision just over and above the umbilicus with a scalpel.  I carried this down to the hernia that was located at the umbilicus.  I freed up the omentum from the sac and dropped back in the abdominal cavity.  I then placed a 0 Vicryl pursing suture around the fascial edges of the hernia.  The esophagus placed the opening and insufflation of the abdomen was begun.  Placed a 5 mm trocar patient right upper quadrant and another in the lower midline under direct vision.  The patient had an inflammatory process in the right lower quadrant.  There was some white fluid without odor and  it did not appear consistent with purulence.  There was also fluid above the liver which was white as well.  The fluid was thin.  I was able to identify the base of the appendix and then the slightly dilated and injected distal appendix.  I took down the mesoappendix with the harmonic scalpel.  I then transected the base of the appendix with the laparoscopic GIA stapler.  The appendix was then completely removed and placed in an Endosac and removed through the incision at the umbilicus.  I then copiously irrigated the abdomen normal saline.  I suctioned the fluid above the liver.  The liver had appearance consistent with cirrhosis.  I saw no other gross intra-abdominal issues other than a thickened, dilated bladder.  At this point all ports removed under direct vision the abdomen was deflated.  The 0 Vicryl pursing suture was tied in place closing the fascial defect.  I placed another figure-of-eight 0 Vicryl suture at the fascia as well.  All incisions were then anesthetized Marcaine and closed with 4-0 Monocryl sutures.  Dermabond was then applied.  The patient tolerated the procedure well.  All the counts were correct at the end of the procedure.  The patient was then taken in a stable condition from the operating room to the recovery room.          Coralie Keens   Date: 09/18/2020  Time: 3:17 PM

## 2020-09-18 NOTE — H&P (Addendum)
Whitney [name] [birth year]  [id number].    Requesting MD: Dr. Pattricia Boss Chief Complaint/Reason for Consult: appendicitis  HPI:  This is an 85 yo Arabic female with a history of CKD, DM, chronically dislocated right hip, bed bound since April 2021, who was just admitted about 3-4 weeks ago with Klebsiella bacteremia secondary to emphysematous cystitis with L pyelitis that required a ureteral stent placement on 08/18/20.  She is followed by the teaching service as well as urology and will require stent exchange on 09/27/20.    She presented to the [facility] today with acute onset RLQ abdominal pain. The pain started last night around 2200.  Associated with multiple episodes of n/v. She tried taking pain medication but this did not help. Denies fever or chills. Never had pain like this before. She denies any CP, SOB, chills, fevers, diarrhea.  Her pain persisted so she came to the ED.  She presented to the [facility] where she is found to have a CT scan concerning for retrocecal early appendicitis.  She has a normal WBC, Hgb 9.7, Cr 1.41.  We have been asked to see her for further evaluation and recommendations.  No prior h/o abdominal surgery Nonsmoker NKDA Completed covid vaccine series about 2 months ago  ROS: ROS: Please see HPI, otherwise all other systems have been reviewed and are negative.  No family history on file.  Past Medical History:  Diagnosis Date   CKD (chronic kidney disease)    Diabetes mellitus without complication ([place])     Past Surgical History:  Procedure Laterality Date   CYSTOSCOPY W/ URETERAL STENT PLACEMENT Left 08/18/2020   Procedure: CYSTOSCOPY WITH RETROGRADE PYELOGRAM/URETERAL STENT PLACEMENT;  Surgeon: [name], MD;  Location: [facility];  Service: Urology;  Laterality: Left;   IR FLUORO GUIDE CV LINE RIGHT  08/23/2020   IR REMOVAL TUN CV CATH W/O FL  08/31/2020   IR US GUIDE VASC ACCESS RIGHT  08/23/2020   TOTAL HIP ARTHROPLASTY Right 03/05/2020    Procedure: HIP GIRDLE STONE;  Surgeon: Leandrew Koyanagi, MD;  Location: [place];  Service: Orthopedics;  Laterality: Right;    Social History:  reports that she has never smoked. She has never used smokeless tobacco. She reports that she does not drink alcohol and does not use drugs.  Allergies: No Known Allergies  (Not in a hospital admission)    Physical Exam: Blood pressure (!) 174/83, pulse 98, temperature (!) 97.3 F (36.3 C), temperature source Axillary, resp. rate (!) 21, SpO2 100 %. General: frail elderly Arabic female who is laying in bed in NAD HEENT: head is normocephalic, atraumatic.  Sclera are noninjected.  PERRL.  Ears and nose without any masses or lesions.  Mouth is dry, poor dentition  Heart: regular, rate, and rhythm.  Normal s1,s2. No obvious murmurs, gallops, or rubs noted. Feet WWP bilaterally Lungs: decreased breath sounds bilateral bases, no wheezes, rhonchi, or rales noted.  Respiratory effort nonlabored Abd: soft, ND, +BS, no masses or organomegaly. Significant focal tenderness RLQ without peritonitis MS: all 4 extremities are symmetrical with no cyanosis or clubbing. 1-2+ edema BLE Skin: warm and dry with no masses, lesions, or rashes Neuro: Cranial nerves 2-12 grossly intact, sensation is normal throughout Psych: A&Ox3 with an appropriate affect.   Results for orders placed or performed during the hospital encounter of 09/18/20 (from the past 48 hour(s))  CBC with Differential     Status: Abnormal   Collection Time: 09/18/20  8:00 AM  Result Value Ref Range   WBC 8.0 4.0 - 10.5 K/uL   RBC 3.35 (L) 3.87 - 5.11 MIL/uL   Hemoglobin 9.7 (L) 12.0 - 15.0 g/dL   HCT 32.7 (L) 36 - 46 %   MCV 97.6 80.0 - 100.0 fL   MCH 29.0 26.0 - 34.0 pg   MCHC 29.7 (L) 30.0 - 36.0 g/dL   RDW 15.3 11.5 - 15.5 %   Platelets PLATELET CLUMPS NOTED ON SMEAR, UNABLE TO ESTIMATE 150 - 400 K/uL    Comment: Immature Platelet Fraction may be clinically indicated, consider ordering this  additional test [id number]    nRBC 0.0 0.0 - 0.2 %   Neutrophils Relative % 85 %   Neutro Abs 6.8 1.7 - 7.7 K/uL   Lymphocytes Relative 9 %   Lymphs Abs 0.7 0.7 - 4.0 K/uL   Monocytes Relative 5 %   Monocytes Absolute 0.4 0.1 - 1.0 K/uL   Eosinophils Relative 0 %   Eosinophils Absolute 0.0 0.0 - 0.5 K/uL   Basophils Relative 0 %   Basophils Absolute 0.0 0.0 - 0.1 K/uL   Immature Granulocytes 1 %   Abs Immature Granulocytes 0.06 0.00 - 0.07 K/uL    Comment: Performed at [facility], [address] [place], [place] [address]  Comprehensive metabolic panel     Status: Abnormal   Collection Time: 09/18/20  8:00 AM  Result Value Ref Range   Sodium 140 135 - 145 mmol/L   Potassium 4.3 3.5 - 5.1 mmol/L    Comment: SLIGHT HEMOLYSIS   Chloride 110 98 - 111 mmol/L   CO2 23 22 - 32 mmol/L   Glucose, Bld 224 (H) 70 - 99 mg/dL    Comment: Glucose reference range applies only to samples taken after fasting for at least 8 hours.   BUN 21 8 - 23 mg/dL   Creatinine, Ser 1.41 (H) 0.44 - 1.00 mg/dL   Calcium 7.3 (L) 8.9 - 10.3 mg/dL   Total Protein 6.2 (L) 6.5 - 8.1 g/dL   Albumin 1.3 (L) 3.5 - 5.0 g/dL   AST 35 15 - 41 U/L   ALT 10 0 - 44 U/L   Alkaline Phosphatase 154 (H) 38 - 126 U/L   Total Bilirubin 1.1 0.3 - 1.2 mg/dL   GFR, Estimated 37 (L) >60 mL/min    Comment: (NOTE) Calculated using the CKD-EPI Creatinine Equation (2021)    Anion gap 7 5 - 15    Comment: Performed at [facility], [place] [address], [place], [place] [address]  Lactic acid, plasma     Status: None   Collection Time: 09/18/20  8:00 AM  Result Value Ref Range   Lactic Acid, Venous 0.9 0.5 - 1.9 mmol/L    Comment: Performed at [address], [place], [place] [address]  Lipase, blood     Status: None   Collection Time: 09/18/20  8:00 AM  Result Value Ref Range   Lipase 14 11 - 51 U/L    Comment: Performed at [facility], [place] [address], [place], [place] [address]   CT  ABDOMEN PELVIS W CONTRAST  Result Date: 09/18/2020 CLINICAL DATA:  Abdominal pain, nausea, vomiting EXAM: CT ABDOMEN AND PELVIS WITH CONTRAST TECHNIQUE: Multidetector CT imaging of the abdomen and pelvis was performed using the standard protocol following bolus administration of intravenous contrast. CONTRAST:  77mL OMNIPAQUE IOHEXOL 300 MG/ML  SOLN COMPARISON:  08/20/2020 FINDINGS: Lower chest: Coronary  calcifications. Increase in small bilateral pleural effusions. Stable bleb at the right lung base. Increase in interstitial opacities in the lung bases, and patchy consolidation/atelectasis posteriorly at the right lung base. Hepatobiliary: Mildly nodular hepatic contour without focal lesion. Several partially calcified stones in the dependent aspect of the lumen of the mildly distended gallbladder. No biliary ductal dilatation. Pancreas: Advanced parenchymal atrophy without focal lesion or ductal dilatation. Spleen: Normal in size without focal abnormality. Adrenals/Urinary Tract: Adrenal glands unremarkable. Focal parenchymal loss in the upper pole right kidney, stable. Left double-J ureteral stent in good position. Mild fullness of the central renal collecting system and proximal ureterectasis without overt hydronephrosis. Urinary bladder is distended containing small amount of gas suggesting recent instrumentation. 2.7 cm exophytic probable cyst from the lower pole left kidney. Stomach/Bowel: The stomach is nondilated. The small bowel is decompressed. Appendix: Location: Retrocecal Diameter: 9 mm Appendicolith: None seen Mucosal hyper-enhancement: Indeterminate Extraluminal gas: None seen Periappendiceal collection: No loculated collection The colon is non distended. There is fecal dilatation of the rectum up to 7.9 cm. Vascular/Lymphatic: Aorta unremarkable. Infrarenal IVC filter with caval wall penetration by filter legs involving the aorta and L3 vertebral body. Stable subcentimeter left para-aortic and  aortocaval lymph nodes. For no pelvic adenopathy. Prominent supraceliac and gastrohepatic ligament lymph nodes measuring up to 0.9 cm short axis diameter. Reproductive: Stable coarse uterine calcifications. No adnexal mass. Other: New small volume abdominal ascites predominantly perihepatic, perisplenic, and left lower quadrant. No free air. Musculoskeletal: Ventral hernia containing only mesenteric fat. Stable 3.8 cm presumed injection granuloma posterior to the left SI joint. Stable L1 and L3 compression deformities. Chronic fracture-dislocation of the right hip resorption of much of the femoral head. Left hip arthroplasty components are partially visualized, with chronic protrusio and attempts at cement restoration. No acute fracture or worrisome bone lesion. IMPRESSION: 1. Dilated appendix with equivocal mucosal hyper-enhancement, may represent early appendicitis, without evidence of perforation or abscess. 2. New small volume abdominal ascites. 3. Increase in small bilateral pleural effusions. 4. Cholelithiasis. 5. Coronary calcifications. 6. Additional chronic findings as above. Aortic Atherosclerosis (ICD10-I70.0). Electronically Signed   By: Lucrezia Europe M.D.   On: 09/18/2020 08:17      Assessment/Plan CKD - cr 1.4 DM Osteoporosis Bedbound Chronically dislocated right hip Recent Klebsiella bacteremia due to emphysematous cystitis with uretal stent in place  Appendicitis See consult note   Wellington Hampshire, [facility] 09/18/2020, 10:51 AM Please see Amion for pager number during day hours 7:00am-4:30pm or 7:00am -11:30am on weekends

## 2020-09-18 NOTE — ED Notes (Signed)
Gave report to OR. Patient cannot come up until COVID test comes back.

## 2020-09-19 ENCOUNTER — Encounter (HOSPITAL_COMMUNITY): Payer: Self-pay | Admitting: Surgery

## 2020-09-19 LAB — GLUCOSE, CAPILLARY
Glucose-Capillary: 108 mg/dL — ABNORMAL HIGH (ref 70–99)
Glucose-Capillary: 122 mg/dL — ABNORMAL HIGH (ref 70–99)
Glucose-Capillary: 126 mg/dL — ABNORMAL HIGH (ref 70–99)
Glucose-Capillary: 130 mg/dL — ABNORMAL HIGH (ref 70–99)
Glucose-Capillary: 166 mg/dL — ABNORMAL HIGH (ref 70–99)
Glucose-Capillary: 203 mg/dL — ABNORMAL HIGH (ref 70–99)

## 2020-09-19 LAB — CBC
HCT: 33.5 % — ABNORMAL LOW (ref 36.0–46.0)
Hemoglobin: 10.1 g/dL — ABNORMAL LOW (ref 12.0–15.0)
MCH: 29.2 pg (ref 26.0–34.0)
MCHC: 30.1 g/dL (ref 30.0–36.0)
MCV: 96.8 fL (ref 80.0–100.0)
Platelets: 144 10*3/uL — ABNORMAL LOW (ref 150–400)
RBC: 3.46 MIL/uL — ABNORMAL LOW (ref 3.87–5.11)
RDW: 15.5 % (ref 11.5–15.5)
WBC: 18.8 10*3/uL — ABNORMAL HIGH (ref 4.0–10.5)
nRBC: 0 % (ref 0.0–0.2)

## 2020-09-19 LAB — COMPREHENSIVE METABOLIC PANEL
ALT: 12 U/L (ref 0–44)
AST: 25 U/L (ref 15–41)
Albumin: 1.3 g/dL — ABNORMAL LOW (ref 3.5–5.0)
Alkaline Phosphatase: 132 U/L — ABNORMAL HIGH (ref 38–126)
Anion gap: 6 (ref 5–15)
BUN: 19 mg/dL (ref 8–23)
CO2: 26 mmol/L (ref 22–32)
Calcium: 7.7 mg/dL — ABNORMAL LOW (ref 8.9–10.3)
Chloride: 111 mmol/L (ref 98–111)
Creatinine, Ser: 1.31 mg/dL — ABNORMAL HIGH (ref 0.44–1.00)
GFR, Estimated: 40 mL/min — ABNORMAL LOW (ref >60)
Glucose, Bld: 184 mg/dL — ABNORMAL HIGH (ref 70–99)
Potassium: 4.2 mmol/L (ref 3.5–5.1)
Sodium: 143 mmol/L (ref 135–145)
Total Bilirubin: 0.5 mg/dL (ref 0.3–1.2)
Total Protein: 6.1 g/dL — ABNORMAL LOW (ref 6.5–8.1)

## 2020-09-19 LAB — HEPATITIS B SURFACE ANTIBODY,QUALITATIVE: Hep B S Ab: REACTIVE — AB

## 2020-09-19 LAB — HEPATITIS B CORE ANTIBODY, TOTAL: Hep B Core Total Ab: REACTIVE — AB

## 2020-09-19 LAB — HEPATITIS B SURFACE ANTIGEN: Hepatitis B Surface Ag: NONREACTIVE

## 2020-09-19 LAB — PROTIME-INR
INR: 1.5 — ABNORMAL HIGH (ref 0.8–1.2)
Prothrombin Time: 17.6 seconds — ABNORMAL HIGH (ref 11.4–15.2)

## 2020-09-19 MED ORDER — ADULT MULTIVITAMIN W/MINERALS CH
1.0000 | ORAL_TABLET | Freq: Every day | ORAL | Status: DC
  Administered 2020-09-19 – 2020-10-03 (×12): 1 via ORAL
  Filled 2020-09-19 (×15): qty 1

## 2020-09-19 MED ORDER — BOOST / RESOURCE BREEZE PO LIQD CUSTOM
1.0000 | Freq: Three times a day (TID) | ORAL | Status: DC
  Administered 2020-09-19 – 2020-09-27 (×17): 1 via ORAL

## 2020-09-19 MED ORDER — ENOXAPARIN SODIUM 40 MG/0.4ML ~~LOC~~ SOLN
40.0000 mg | SUBCUTANEOUS | Status: DC
  Administered 2020-09-19: 40 mg via SUBCUTANEOUS
  Filled 2020-09-19: qty 0.4

## 2020-09-19 MED ORDER — CHLORHEXIDINE GLUCONATE CLOTH 2 % EX PADS
6.0000 | MEDICATED_PAD | Freq: Every day | CUTANEOUS | Status: DC
  Administered 2020-09-19 – 2020-10-03 (×14): 6 via TOPICAL

## 2020-09-19 NOTE — Consult Note (Signed)
[facility] Nurse Consult Note: Patient receiving care in [place].  I used the portable interpreter service device with Operator Katharine Look [phone] throughout my encounter. Reason for Consult: "pressure ulcer" Wound type: the wound to the lateral RLE doesn't appear consistent with a pressure injury.  I am not sure the origin of the wound Pressure Injury POA: Yes Measurement: 7 cm x 7 cm x 0.1 cm Wound bed: 100% pink Drainage (amount, consistency, odor) none Periwound: intact Dressing procedure/placement/frequency: Gently wash wound on lateral RLE with saline. Pat dry. Place a foam dressing over the wound. Change the foam every 3 days and prn. Monitor the wound area(s) for worsening of condition such as: Signs/symptoms of infection,  Increase in size,  Development of or worsening of odor, Development of pain, or increased pain at the affected locations.  Notify the medical team if any of these develop.  Thank you for the consult.  Discussed plan of care with the patient.  [facility] nurse will not follow at this time.  Please re-consult the [facility] team if needed.  Val Riles, RN, MSN, CWOCN, CNS-BC, pager [phone]

## 2020-09-19 NOTE — Progress Notes (Signed)
Initial Nutrition Assessment  DOCUMENTATION CODES:   Obesity unspecified  INTERVENTION:    Boost Breeze po TID, each supplement provides 250 kcal and 9 grams of protein  MVI with minerals daily  NUTRITION DIAGNOSIS:   Inadequate oral intake related to poor appetite as evidenced by per patient/family report, meal completion < 25%.   GOAL:   Patient will meet greater than or equal to 90% of their needs   MONITOR:   PO intake, Supplement acceptance  REASON FOR ASSESSMENT:   Consult Assessment of nutrition requirement/status  ASSESSMENT:   Pt with PMH of IDDM, CKD3, and recent admission for pyelonephritis (08/18/20). Admitted with RLQ abdominal pain found to have acute appendicitis. Appendectomy on 11/8.   Per chart review pt lives with daughter. Pt is bed bound > 13 years but has family support.    Spoke with nurse and nurse tech prior to visit, reports poor appetite with meals.   Pt is Arabic speaking, used [employer] Interpreter: [employer] #[id number]  Pt reports declining appetite for the past year.Pt reports smaller breakfast and dinner meals and a large lunch meal. Typical intake: Breakfast: English muffin with cheese. Lunch: large meal of soup and meats. Dinner: [employer]. Pt states to like lemonade but does not like other fruit juices. Pt reports no appetite post appendectomy.   Claims she had a weight loss of 70 lbs in past year. Per review of documented weights, does not indicate weight loss over past 6 months. Edema could be masking weight loss, and fat and muscle depletions.    Discussed Ensure with patient. She has tried some supplements in the past but claims they cause her diarrhea. Offered [employer] and pt was agreeable to trying this.    Labs reviewed: Glucose 184.   Medications reviewed and include: SSI.      NUTRITION - FOCUSED PHYSICAL EXAM:    Most Recent Value  Orbital Region No depletion  Upper Arm Region Moderate depletion  Thoracic and Lumbar  Region No depletion  Buccal Region No depletion  Temple Region No depletion  Clavicle Bone Region No depletion  Clavicle and Acromion Bone Region No depletion  Scapular Bone Region No depletion  Dorsal Hand Mild depletion  Patellar Region No depletion  Anterior Thigh Region No depletion  Posterior Calf Region No depletion  Edema (RD Assessment) Moderate  Hair Reviewed  Eyes Reviewed  Mouth Reviewed  Skin Reviewed  Nails Reviewed       Diet Order:   Diet Order            Diet Carb Modified Fluid consistency: Thin; Room service appropriate? Yes  Diet effective 1400                 EDUCATION NEEDS:   Education needs have been addressed  Skin:  Skin Assessment: Reviewed RN Assessment  Last BM:  unknown  Height:   Ht Readings from Last 1 Encounters:  09/18/20 5\' 3"  (1.6 m)    Weight:   Wt Readings from Last 1 Encounters:  09/18/20 90 kg    Ideal Body Weight:  115 kg  BMI:  Body mass index is 35.15 kg/m.  Estimated Nutritional Needs:   Kcal:  1600-1800  Protein:  85-100 grams  Fluid:  >/= 1.6L/day    Ronnald Nian, Dietetic Intern Pager: [phone] If unavailable: [phone]

## 2020-09-19 NOTE — Evaluation (Signed)
Occupational Therapy Evaluation Patient Details Name: Whitney [name] MRN: [id number] DOB: 06-17-1935 Today's Date: 09/19/2020    History of Present Illness Whitney [name] is 85 year old female with a past medical history of insulin-dependent diabetes, CKD stage III, and chronic right hip fracture that presented to [facility] with 3 week history of nausea and vomiting pyelonephritis, abdominal pain; acute appendicitis.   Clinical Impression   Whitney [name] PTA: Whitney [name] living at home with daughter and reports very good family support. Whitney [name] reports bed bound for a while and requires assist for ADL. Whitney [name] currently rolling side to side with maxA and initiating with BUEs. Whitney [name]'s BLEs are totalA and unable to initiate movement. Whitney [name] able to feed self and perform grooming tasks. Whitney [name] does not require continued OT skilled services as Whitney [name] is at her functional baseline for ADL and bed mobility. OT D/C at this time.  Interpreter used for session. Whitney [name] [id number]      Follow Up Recommendations  No OT follow up;Supervision/Assistance - 24 hour    Equipment Recommendations  None recommended by OT    Recommendations for Other Services       Precautions / Restrictions Precautions Precautions: Fall Precaution Comments: chronic R Hip dislocation, edema t/o abdomen, pelvis and LEs Restrictions Weight Bearing Restrictions: No      Mobility Bed Mobility Overal bed mobility: Needs Assistance Bed Mobility: Rolling Rolling: Max assist         General bed mobility comments: Rolling side to side with assist; easier rolling to L side as R side is sore    Transfers                 General transfer comment: DNT    Balance                                           ADL either performed or assessed with clinical judgement   ADL Overall ADL's : At baseline                                       General ADL Comments: Whitney [name] reports that she usually requires assist for all ADL and bed mobility.       Vision Baseline Vision/History: No visual deficits Patient Visual Report: No change from baseline Vision Assessment?: No apparent visual deficits     Perception     Praxis      Pertinent Vitals/Pain Pain Assessment: Faces Faces Pain Scale: No hurt Pain Intervention(s): Monitored during session     Hand Dominance Right   Extremity/Trunk Assessment Upper Extremity Assessment Upper Extremity Assessment: Generalized weakness RUE Deficits / Details: AROM WFL, strength at least 3/5  LUE Deficits / Details: AROM WFL, strength at least 3/5 below shoulder   Lower Extremity Assessment Lower Extremity Assessment: Generalized weakness RLE Deficits / Details: recurrent hip dislocation, edema LLE Deficits / Details: edema   Cervical / Trunk Assessment Cervical / Trunk Assessment: Other exceptions Cervical / Trunk Exceptions: large body habitus   Communication Communication Communication: Prefers language other than English (Arabic, [place] dialect)   Cognition Arousal/Alertness: Awake/alert Behavior During Therapy: WFL for tasks assessed/performed Overall Cognitive Status: Difficult to assess  General Comments: Using interpreter for conversation   General Comments  Interpreter [name] used [phone]    Exercises     Shoulder Instructions      Home Living Family/patient expects to be discharged to:: Private residence Living Arrangements: Children Available Help at Discharge: Family;Available 24 hours/day Type of Home: Apartment       Home Layout: One level               Home Equipment: Hospital bed   Additional Comments: used interpreter for info      Prior Functioning/Environment Level of Independence: Needs assistance  Gait / Transfers Assistance Needed: Whitney [name] able to roll side to side; bedbound at home; daughter performs all mobility with Whitney [name] ADL's / Homemaking Assistance Needed: Whitney [name] reports requiring assist from  her daughter "with everything" Communication / Swallowing Assistance Needed: Arabic language; [place] dialect Comments: Whitney [name] requires assist with ADL and mobility and that her daughter attends very well to her.        OT Problem List: Decreased strength;Decreased activity tolerance;Pain;Increased edema      OT Treatment/Interventions:      OT Goals(Current goals can be found in the care plan section) Acute Rehab OT Goals Patient Stated Goal: to help pain  OT Frequency:     Barriers to D/C:            Co-evaluation Whitney [name]/OT/SLP Co-Evaluation/Treatment: Yes Reason for Co-Treatment: Complexity of the patient's impairments (multi-system involvement);To address functional/ADL transfers   OT goals addressed during session: ADL's and self-care      AM-PAC OT "6 Clicks" Daily Activity     Outcome Measure Help from another person eating meals?: A Little Help from another person taking care of personal grooming?: A Little Help from another person toileting, which includes using toliet, bedpan, or urinal?: Total Help from another person bathing (including washing, rinsing, drying)?: Total Help from another person to put on and taking off regular upper body clothing?: Total Help from another person to put on and taking off regular lower body clothing?: Total 6 Click Score: 10   End of Session Nurse Communication: Mobility status  Activity Tolerance: Patient limited by pain Patient left: in bed;with call bell/phone within reach  OT Visit Diagnosis: Muscle weakness (generalized) (M62.81);Pain Pain - part of body:  (R side of abdomen s/p appendectomy)                Time: 2035-5974 OT Time Calculation (min): 40 min Charges:  OT General Charges $OT Visit: 1 Visit OT Evaluation $OT Eval Moderate Complexity: 1 Mod OT Treatments $Therapeutic Activity: 8-22 mins  Jefferey Pica, OTR/L Acute Rehabilitation Services Pager: [phone] Office: [phone]   [facility] 09/19/2020, 11:47 AM

## 2020-09-19 NOTE — Progress Notes (Signed)
  Date: 09/19/2020  Patient name: Whitney [name]  Medical record number: [id number]  Date of birth: [birth year]   I have seen and evaluated [name] and discussed their care with the Residency Team.  In brief, patient is an 85 year old female with a past medical history of type 2 diabetes, CKD stage IIIb, hypoalbuminemia, chronic right hip fracture with recent admission for emphysematous cystitis and pyelitis secondary to ESBL Klebsiella who presented to the ED with right lower quadrant abdominal pain.  Patient was in her usual state of health until the night before admission when she developed sudden onset abdominal pain.  Pain was the right lower quadrant, nonradiating and associated with nausea and vomiting.  Vomitus was nonbloody nonbilious.  The pain worsened overnight and yesterday morning patient began to appear somnolent and fatigued and was brought to the ED for further evaluation.  Patient also has chronic orthopnea and lower extremity edema which is unchanged.  Today, patient complains of persistent right lower quadrant pain but states that this is improved since admission.  PMHx, Fam Hx, and/or Soc Hx : As per resident admit note  Vitals:   09/19/20 0237 09/19/20 0406  BP: (!) 112/56 (!) 108/56  Pulse: 85 81  Resp: 18 17  Temp: 98.2 F (36.8 C) (!) 97.5 F (36.4 C)  SpO2: 100% 100%   General: Awake, alert, x3, NAD CVS: Regular rate and rhythm, normal heart sounds Lungs: CTA bilaterally Abdomen: Soft, mild tenderness to palpation over right lower quadrant and suprapubic regions, no rebound/guarding, nondistended, normoactive bowel sounds, surgical incisions are clean with no discharge Extremities: 2+ bilateral lower extremity pitting edema noted, nontender to palpation HEENT: Normocephalic, atraumatic Psych: Normal mood and affect Neuro: Oriented x3  Assessment and Plan: I have seen and evaluated the patient as outlined above. I agree with the formulated Assessment and Plan  as detailed in the residents' note, with the following changes:   1.  Acute appendicitis: -Patient presented to the ED for sudden onset of right lower quadrant abdominal pain and was found to have early appendicitis on CT abdomen/pelvis. -Patient is status post laparoscopic appendectomy postop day 1 -Surgery follow-up recommendations appreciated -Patient noted to have worsening leukocytosis today which is likely reactive to her surgery.  We will recheck her CBC in a.m.  No fevers or chills or other signs of an infection -Patient with some persistent tenderness over her right lower quadrant and suprapubic regions.  We will continue to monitor closely -No indication for further antibiotic treatment at this time -We will also recheck a UA to rule out recurrent infection given suprapubic tenderness and new elevation in WBC -No further work-up at this time  2.  Hypoalbuminemia: -Patient has anasarca secondary to her hypoalbuminemia.  Etiology behind her hypoalbuminemia was thought to be secondary to severe protein malnutrition from dietary intake.  However, patient was noted to have some nodularity of her liver during her surgery as well as on the CT of her abdomen concerning for possible cirrhosis. -We will check PT/INR as well as hepatitis panel to rule out infectious etiology causing cirrhosis -No further work-up at this time  Aldine Contes, MD 11/9/202111:07 AM

## 2020-09-19 NOTE — Evaluation (Signed)
Physical Therapy Evaluation & Discharge Patient Details Name: Whitney [name] MRN: [id number] DOB: [birth year] Today's Date: 09/19/2020   History of Present Illness  [name] is 85 year old female with a past medical history of insulin-dependent diabetes, CKD stage III, and chronic right hip fracture that presented to [facility] with 3 week history of nausea and vomiting pyelonephritis, abdominal pain; acute appendicitis.  Clinical Impression  PTA, patient has been bed bound for >13 years and living with daughter with good family support. Patient requires maxA+2 for rolling side to side and initiating B UEs and total A for management of B LEs. Patient does not require skilled PT services at this time as patient is at functional baseline for bed mobility and overall mobility. PT d/c at this time.  Interpreter used for session. Whitney [name] [id number]   Follow Up Recommendations No PT follow up;Supervision/Assistance - 24 hour    Equipment Recommendations  Other (comment) (hoyer lift and hoyer pad)    Recommendations for Other Services       Precautions / Restrictions Precautions Precautions: Fall Precaution Comments: chronic R Hip dislocation, edema t/o abdomen, pelvis and LEs Restrictions Weight Bearing Restrictions: No      Mobility  Bed Mobility Overal bed mobility: Needs Assistance Bed Mobility: Rolling Rolling: Max assist;+2 for physical assistance;+2 for safety/equipment         General bed mobility comments: Rolling side to side with assist; easier rolling to L side as R side is sore    Transfers                 General transfer comment: unable  Ambulation/Gait             General Gait Details: unable  Stairs            Wheelchair Mobility    Modified Rankin (Stroke Patients Only)       Balance Overall balance assessment:  (bed bound)                                           Pertinent Vitals/Pain Pain Assessment: Faces Faces Pain  Scale: No hurt Pain Location: though pt self limiting with mobility as moving is painful  Pain Descriptors / Indicators: Grimacing;Moaning Pain Intervention(s): Monitored during session;Repositioned    Home Living Family/patient expects to be discharged to:: Private residence Living Arrangements: Children Available Help at Discharge: Family;Available 24 hours/day Type of Home: Apartment       Home Layout: One level Home Equipment: Hospital bed Additional Comments: used interpreter for info    Prior Function Level of Independence: Needs assistance   Gait / Transfers Assistance Needed: Pt able to roll side to side; bedbound at home; daughter performs all mobility with pt  ADL's / Homemaking Assistance Needed: pt reports requiring assist from her daughter "with everything"  Comments: Pt requires assist with ADL and mobility and that her daughter attends very well to her.     Hand Dominance   Dominant Hand: Right    Extremity/Trunk Assessment   Upper Extremity Assessment Upper Extremity Assessment: Defer to OT evaluation RUE Deficits / Details: AROM WFL, strength at least 3/5  LUE Deficits / Details: AROM WFL, strength at least 3/5 below shoulder    Lower Extremity Assessment Lower Extremity Assessment: Generalized weakness RLE Deficits / Details: recurrent hip dislocation, edema LLE Deficits / Details: edema    Cervical / Trunk Assessment  Cervical / Trunk Assessment: Other exceptions Cervical / Trunk Exceptions: large body habitus  Communication   Communication: Prefers language other than [place] (Arabic, [place] dialect)  Cognition Arousal/Alertness: Awake/alert Behavior During Therapy: WFL for tasks assessed/performed Overall Cognitive Status: Difficult to assess                                 General Comments: Using interpreter for conversation      General Comments General comments (skin integrity, edema, etc.): Interpreter [name] used  [id number]    Exercises     Assessment/Plan    PT Assessment Patent does not need any further PT services  PT Problem List Decreased mobility;Decreased strength       PT Treatment Interventions      PT Goals (Current goals can be found in the Care Plan section)  Acute Rehab PT Goals Patient Stated Goal: to help pain    Frequency     Barriers to discharge        Co-evaluation PT/OT/SLP Co-Evaluation/Treatment: Yes Reason for Co-Treatment: Complexity of the patient's impairments (multi-system involvement);To address functional/ADL transfers PT goals addressed during session: Mobility/safety with mobility OT goals addressed during session: ADL's and self-care       AM-PAC PT "6 Clicks" Mobility  Outcome Measure Help needed turning from your back to your side while in a flat bed without using bedrails?: A Lot Help needed moving from lying on your back to sitting on the side of a flat bed without using bedrails?: Total Help needed moving to and from a bed to a chair (including a wheelchair)?: Total Help needed standing up from a chair using your arms (e.g., wheelchair or bedside chair)?: Total Help needed to walk in hospital room?: Total Help needed climbing 3-5 steps with a railing? : Total 6 Click Score: 7    End of Session   Activity Tolerance: Patient limited by pain Patient left: in bed;with call bell/phone within reach;with nursing/sitter in room Nurse Communication: Mobility status;Need for lift equipment PT Visit Diagnosis: Other abnormalities of gait and mobility (R26.89);Muscle weakness (generalized) (M62.81)    Time: 7034-0352 PT Time Calculation (min) (ACUTE ONLY): 40 min   Charges:   PT Evaluation $PT Eval Moderate Complexity: 1 Mod          Perrin Maltese, PT, DPT Acute Rehabilitation Services Pager [phone] Office [phone]   Chesley Noon K Allred 09/19/2020, 1:01 PM

## 2020-09-19 NOTE — Anesthesia Postprocedure Evaluation (Signed)
Anesthesia Post Note  Patient: Whitney [name]  Procedure(s) Performed: APPENDECTOMY LAPAROSCOPIC (N/A Abdomen)     Patient location during evaluation: PACU Anesthesia Type: General Level of consciousness: awake and alert Pain management: pain level controlled Vital Signs Assessment: post-procedure vital signs reviewed and stable Respiratory status: spontaneous breathing, nonlabored ventilation, respiratory function stable and patient connected to nasal cannula oxygen Cardiovascular status: blood pressure returned to baseline and stable Postop Assessment: no apparent nausea or vomiting Anesthetic complications: no Comments: Prolonged paralysis after succinylcholine administration. See intraoperative record. In PACU, pt remained alert and oriented. Follows commands with daughter translating without difficulty. VSS.    No complications documented.  Last Vitals:  Vitals:   09/19/20 0237 09/19/20 0406  BP: (!) 112/56 (!) 108/56  Pulse: 85 81  Resp: 18 17  Temp: 36.8 C (!) 36.4 C  SpO2: 100% 100%    Last Pain:  Vitals:   09/19/20 1152  TempSrc:   PainSc: Asleep   Pain Goal:                   Whitney [name] L Dewaun Kinzler

## 2020-09-19 NOTE — Progress Notes (Signed)
1 Day Post-Op  Subjective: Still with some pain in the RLQ.  No nausea.  Drinking some.  ROS: See above, otherwise other systems negative  Objective: Vital signs in last 24 hours: Temp:  [96.5 F (35.8 C)-98.2 F (36.8 C)] 97.5 F (36.4 C) (11/09 0406) Pulse Rate:  [81-111] 81 (11/09 0406) Resp:  [16-31] 17 (11/09 0406) BP: (108-165)/(41-102) 108/56 (11/09 0406) SpO2:  [95 %-100 %] 100 % (11/09 0406) Weight:  [90 kg] 90 kg (11/08 1158) Last BM Date: 09/17/20  Intake/Output from previous day: 11/08 0701 - 11/09 0700 In: 2001.6 [P.O.:220; I.V.:1085; IV Piggyback:196.6] Out: 320 [Urine:300; Blood:20] Intake/Output this shift: No intake/output data recorded.  PE: Abd: soft, but still more tender than expected on right side particularly in RLQ and RMQ.  +BS, incisions are c/d/i and really not tender around these, ND  Lab Results:  Recent Labs    09/18/20 0800 09/19/20 0502  WBC 8.0 18.8*  HGB 9.7* 10.1*  HCT 32.7* 33.5*  PLT PLATELET CLUMPS NOTED ON SMEAR, UNABLE TO ESTIMATE 144*   BMET Recent Labs    09/18/20 0800 09/19/20 0502  NA 140 143  K 4.3 4.2  CL 110 111  CO2 23 26  GLUCOSE 224* 184*  BUN 21 19  CREATININE 1.41* 1.31*  CALCIUM 7.3* 7.7*   PT/INR No results for input(s): LABPROT, INR in the last 72 hours. CMP     Component Value Date/Time   NA 143 09/19/2020 0502   K 4.2 09/19/2020 0502   CL 111 09/19/2020 0502   CO2 26 09/19/2020 0502   GLUCOSE 184 (H) 09/19/2020 0502   BUN 19 09/19/2020 0502   CREATININE 1.31 (H) 09/19/2020 0502   CALCIUM 7.7 (L) 09/19/2020 0502   PROT 6.1 (L) 09/19/2020 0502   ALBUMIN 1.3 (L) 09/19/2020 0502   AST 25 09/19/2020 0502   ALT 12 09/19/2020 0502   ALKPHOS 132 (H) 09/19/2020 0502   BILITOT 0.5 09/19/2020 0502   GFRNONAA 40 (L) 09/19/2020 0502   GFRAA 42 (L) 05/27/2020 1033   Lipase     Component Value Date/Time   LIPASE 14 09/18/2020 0800       Studies/Results: CT ABDOMEN PELVIS W  CONTRAST  Result Date: 09/18/2020 CLINICAL DATA:  Abdominal pain, nausea, vomiting EXAM: CT ABDOMEN AND PELVIS WITH CONTRAST TECHNIQUE: Multidetector CT imaging of the abdomen and pelvis was performed using the standard protocol following bolus administration of intravenous contrast. CONTRAST:  64mL OMNIPAQUE IOHEXOL 300 MG/ML  SOLN COMPARISON:  08/20/2020 FINDINGS: Lower chest: Coronary calcifications. Increase in small bilateral pleural effusions. Stable bleb at the right lung base. Increase in interstitial opacities in the lung bases, and patchy consolidation/atelectasis posteriorly at the right lung base. Hepatobiliary: Mildly nodular hepatic contour without focal lesion. Several partially calcified stones in the dependent aspect of the lumen of the mildly distended gallbladder. No biliary ductal dilatation. Pancreas: Advanced parenchymal atrophy without focal lesion or ductal dilatation. Spleen: Normal in size without focal abnormality. Adrenals/Urinary Tract: Adrenal glands unremarkable. Focal parenchymal loss in the upper pole right kidney, stable. Left double-J ureteral stent in good position. Mild fullness of the central renal collecting system and proximal ureterectasis without overt hydronephrosis. Urinary bladder is distended containing small amount of gas suggesting recent instrumentation. 2.7 cm exophytic probable cyst from the lower pole left kidney. Stomach/Bowel: The stomach is nondilated. The small bowel is decompressed. Appendix: Location: Retrocecal Diameter: 9 mm Appendicolith: None seen Mucosal hyper-enhancement: Indeterminate Extraluminal gas: None seen Periappendiceal collection:  No loculated collection The colon is non distended. There is fecal dilatation of the rectum up to 7.9 cm. Vascular/Lymphatic: Aorta unremarkable. Infrarenal IVC filter with caval wall penetration by filter legs involving the aorta and L3 vertebral body. Stable subcentimeter left para-aortic and aortocaval lymph  nodes. For no pelvic adenopathy. Prominent supraceliac and gastrohepatic ligament lymph nodes measuring up to 0.9 cm short axis diameter. Reproductive: Stable coarse uterine calcifications. No adnexal mass. Other: New small volume abdominal ascites predominantly perihepatic, perisplenic, and left lower quadrant. No free air. Musculoskeletal: Ventral hernia containing only mesenteric fat. Stable 3.8 cm presumed injection granuloma posterior to the left SI joint. Stable L1 and L3 compression deformities. Chronic fracture-dislocation of the right hip resorption of much of the femoral head. Left hip arthroplasty components are partially visualized, with chronic protrusio and attempts at cement restoration. No acute fracture or worrisome bone lesion. IMPRESSION: 1. Dilated appendix with equivocal mucosal hyper-enhancement, may represent early appendicitis, without evidence of perforation or abscess. 2. New small volume abdominal ascites. 3. Increase in small bilateral pleural effusions. 4. Cholelithiasis. 5. Coronary calcifications. 6. Additional chronic findings as above. Aortic Atherosclerosis (ICD10-I70.0). Electronically Signed   By: Lucrezia Europe M.D.   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  This was removed.  However, she was noted  to have a thin white fluid noted on the right side of her abdomen during surgery of unclear etiology.  This reportedly had no odor, etc. -it is unclear whether her appendix was actually the source of her symptoms  -may want to consider repeat UA to rule out persistent urinary infection as her bladder was very thickened during the time of surgery as well. -diet as tolerates -WBC bump today, likely more reactive post op than infectious but can recheck in am to assure it is downtrending -will follow  ID - rocephin/flagyl preop, none post op FEN - carb mod diet VTE - SCDs, may start chemical prophylaxis from our standpoint, especially given she is bed bound Foley - none Follow up - [facility] clinic in 3 weeks    LOS: 1 day    Henreitta Cea , [facility] 09/19/2020, 10:07 AM Please see Amion for pager number during day hours 7:00am-4:30pm or 7:00am -11:30am on weekends

## 2020-09-19 NOTE — Progress Notes (Signed)
Subjective:   Whitney [name] is a 85 year old female with past medical history of IDDM, CKD3, hypoalbuminemia, chronic right hip fracture, recent admission for emphysematous cystitis and left-sided emphysematous pyelitis with ESBL klebsiella bacteremia who presented to [facility] on 11/08 with right lower quadrant abdominal pain found to have acute appendicitis s/p appendectomy on 11/08.  Overnight, no acute events.  This morning, patient's daughter provided translation for the IMTS. Patient reports that she feels well. She reports mild pain in her right lower abdomen as well as pain in her buttocks. She otherwise has no other complaints and is requesting breakfast.  Objective:  Vital signs in last 24 hours: Vitals:   09/18/20 1700 09/18/20 2216 09/19/20 0237 09/19/20 0406  BP: (!) 108/51 (!) 113/41 (!) 112/56 (!) 108/56  Pulse: 85 86 85 81  Resp: (!) 24 17 18 17   Temp: (!) 97.5 F (36.4 C) 98 F (36.7 C) 98.2 F (36.8 C) (!) 97.5 F (36.4 C)  TempSrc: Axillary Tympanic Tympanic Tympanic  SpO2: 100% 100% 100% 100%  Weight:      Height:       Physical Exam Vitals and nursing note reviewed. Exam conducted with a chaperone present.  Constitutional:      General: She is not in acute distress.    Appearance: She is obese.  HENT:     Head: Normocephalic and atraumatic.  Cardiovascular:     Rate and Rhythm: Normal rate and regular rhythm.     Heart sounds: Normal heart sounds.  Pulmonary:     Effort: Pulmonary effort is normal. No respiratory distress.     Breath sounds: Normal breath sounds.  Abdominal:     General: Abdomen is flat. Bowel sounds are normal. There is no distension.     Palpations: Abdomen is soft.     Tenderness: There is abdominal tenderness in the right lower quadrant.     Comments: Surgical incisions clean, dry with no drainage  Musculoskeletal:     Right lower leg: 3+ Edema present.     Left lower leg: 3+ Edema present.  Skin:    General: Skin is warm and  dry.     Capillary Refill: Capillary refill takes less than 2 seconds.  Neurological:     General: No focal deficit present.     Mental Status: She is alert.  Psychiatric:        Mood and Affect: Mood normal.        Behavior: Behavior normal.    CBC Latest Ref Rng & Units 09/19/2020 09/18/2020 08/31/2020  WBC 4.0 - 10.5 K/uL 18.8(H) 8.0 7.6  Hemoglobin 12.0 - 15.0 g/dL 10.1(L) 9.7(L) 7.6(L)  Hematocrit 36 - 46 % 33.5(L) 32.7(L) 25.6(L)  Platelets 150 - 400 K/uL 144(L) PLATELET CLUMPS NOTED ON SMEAR, UNABLE TO ESTIMATE 189   CMP Latest Ref Rng & Units 09/19/2020 09/18/2020 08/31/2020  Glucose 70 - 99 mg/dL 184(H) 224(H) 142(H)  BUN 8 - 23 mg/dL 19 21 35(H)  Creatinine 0.44 - 1.00 mg/dL 1.31(H) 1.41(H) 1.28(H)  Sodium 135 - 145 mmol/L 143 140 140  Potassium 3.5 - 5.1 mmol/L 4.2 4.3 4.8  Chloride 98 - 111 mmol/L 111 110 113(H)  CO2 22 - 32 mmol/L 26 23 24   Calcium 8.9 - 10.3 mg/dL 7.7(L) 7.3(L) 7.4(L)  Total Protein 6.5 - 8.1 g/dL 6.1(L) 6.2(L) -  Total Bilirubin 0.3 - 1.2 mg/dL 0.5 1.1 -  Alkaline Phos 38 - 126 U/L 132(H) 154(H) -  AST 15 - 41 U/L  25 35 -  ALT 0 - 44 U/L 12 10 -   IMAGING: CT ABDOMEN PELVIS W CONTRAST  Result Date: 09/18/2020 CLINICAL DATA:  Abdominal pain, nausea, vomiting EXAM: CT ABDOMEN AND PELVIS WITH CONTRAST TECHNIQUE: Multidetector CT imaging of the abdomen and pelvis was performed using the standard protocol following bolus administration of intravenous contrast. CONTRAST:  4mL OMNIPAQUE IOHEXOL 300 MG/ML  SOLN COMPARISON:  08/20/2020 FINDINGS: Lower chest: Coronary calcifications. Increase in small bilateral pleural effusions. Stable bleb at the right lung base. Increase in interstitial opacities in the lung bases, and patchy consolidation/atelectasis posteriorly at the right lung base. Hepatobiliary: Mildly nodular hepatic contour without focal lesion. Several partially calcified stones in the dependent aspect of the lumen of the mildly distended gallbladder.  No biliary ductal dilatation. Pancreas: Advanced parenchymal atrophy without focal lesion or ductal dilatation. Spleen: Normal in size without focal abnormality. Adrenals/Urinary Tract: Adrenal glands unremarkable. Focal parenchymal loss in the upper pole right kidney, stable. Left double-J ureteral stent in good position. Mild fullness of the central renal collecting system and proximal ureterectasis without overt hydronephrosis. Urinary bladder is distended containing small amount of gas suggesting recent instrumentation. 2.7 cm exophytic probable cyst from the lower pole left kidney. Stomach/Bowel: The stomach is nondilated. The small bowel is decompressed. Appendix: Location: Retrocecal Diameter: 9 mm Appendicolith: None seen Mucosal hyper-enhancement: Indeterminate Extraluminal gas: None seen Periappendiceal collection: No loculated collection The colon is non distended. There is fecal dilatation of the rectum up to 7.9 cm. Vascular/Lymphatic: Aorta unremarkable. Infrarenal IVC filter with caval wall penetration by filter legs involving the aorta and L3 vertebral body. Stable subcentimeter left para-aortic and aortocaval lymph nodes. For no pelvic adenopathy. Prominent supraceliac and gastrohepatic ligament lymph nodes measuring up to 0.9 cm short axis diameter. Reproductive: Stable coarse uterine calcifications. No adnexal mass. Other: New small volume abdominal ascites predominantly perihepatic, perisplenic, and left lower quadrant. No free air. Musculoskeletal: Ventral hernia containing only mesenteric fat. Stable 3.8 cm presumed injection granuloma posterior to the left SI joint. Stable L1 and L3 compression deformities. Chronic fracture-dislocation of the right hip resorption of much of the femoral head. Left hip arthroplasty components are partially visualized, with chronic protrusio and attempts at cement restoration. No acute fracture or worrisome bone lesion. IMPRESSION: 1. Dilated appendix with  equivocal mucosal hyper-enhancement, may represent early appendicitis, without evidence of perforation or abscess. 2. New small volume abdominal ascites. 3. Increase in small bilateral pleural effusions. 4. Cholelithiasis. 5. Coronary calcifications. 6. Additional chronic findings as above. Aortic Atherosclerosis (ICD10-I70.0). Electronically Signed   By: Lucrezia Europe M.D.   On: 09/18/2020 08:17   Assessment/Plan:  Active Problems:   Appendicitis   S/P appendectomy  Whitney [name] is a 85 year old female with past medical history of IDDM, CKD3, hypoalbuminemia, chronic right hip fracture, recent admission for emphysematous cystitis and left-sided emphysematous pyelitis with ESBL klebsiella bacteremia who presented to [facility] on 11/08 with right lower quadrant abdominal pain found to have acute appendicitis s/p appendectomy on 11/08.  #Appendicitis s/p appendectomy Patient presented with right lower quadrant abdominal pain with evidence of early appendicitis on CT abdomen/pelvis. Patient underwent same-day appendectomy with general surgery on 08/65 without complication. The appendix was noted to be mildly inflamed and set to pathology for further evaluation. Patient continues to have mild abdominal pain in the right lower quadrant following the operation. -Oxycodone 5mg  Q4H PRN -Morphine 1-4mg  Q4H PRN for breakthrough pain -Zofran 4mg  Q6H PRN -Daily CBC -follow-up surgical pathology  #Anasarca, chronic #  Hypoalbuminemia, chronic #Concern for cirrhosis, active Patient has anasarca in the setting of her profound hypoalbuminemia (1.3). During patient's appendectomy, her liver was noticed to have a nodular texture concerning for cirrhosis. LFTs within normal limits, however alkaline phosphatase mildly elevated to 132. We will initiate workup for possible cirrhosis in the inpatient setting, however this condition may benefit from further workup in the outpatient setting. -follow-up PT/INR -follow-up  hepatitis labs:  -Hepatitis B core antibody  -Hepatitis B surface antigen  -Hepatitis B surface antibody -RD consult  #Leukocytosis, active Patient with acute rise in white blood cell count from 8.0 to 18.8. Likely reactive in the setting of her recent operation. Patient does have continued suprapubic tenderness on physical examination this morning, therefore we will check her urine for signs of infection and continue to monitor white blood cell count on repeat labs. -UA -Daily CBC  #CKD 3a, chronic Patient's creatinine at baseline of 1.3. -Monitor creatinine daily on CMP  #IDDM, chronic Patient prescribed 5u lantus nightly at home. CBGs ranging from 122-224 since admission. -Continue sensitive SSI -CBGs AC and HS  #VTE ppx: Lovenox #Diet: Carb modified, consult to registered dietitian #Bowel regimen: Miralax daily PRN #Code status: Full code #PT/OT recs: No PT/OT follow up; supervision/assistance - 24 hour  Cato Mulligan, MD 09/19/2020, 12:52 PM Pager: [phone] After 5pm on weekdays and 1pm on weekends: On Call pager [phone]

## 2020-09-20 ENCOUNTER — Inpatient Hospital Stay (HOSPITAL_COMMUNITY): Payer: 59

## 2020-09-20 LAB — COMPREHENSIVE METABOLIC PANEL
ALT: 12 U/L (ref 0–44)
ALT: 11 U/L (ref 0–44)
AST: 24 U/L (ref 15–41)
AST: 27 U/L (ref 15–41)
Albumin: 1.2 g/dL — ABNORMAL LOW (ref 3.5–5.0)
Albumin: 1.1 g/dL — ABNORMAL LOW (ref 3.5–5.0)
Alkaline Phosphatase: 123 U/L (ref 38–126)
Alkaline Phosphatase: 117 U/L (ref 38–126)
Anion gap: 7 (ref 5–15)
Anion gap: 7 (ref 5–15)
BUN: 19 mg/dL (ref 8–23)
BUN: 22 mg/dL (ref 8–23)
CO2: 24 mmol/L (ref 22–32)
CO2: 23 mmol/L (ref 22–32)
Calcium: 7.5 mg/dL — ABNORMAL LOW (ref 8.9–10.3)
Calcium: 7.4 mg/dL — ABNORMAL LOW (ref 8.9–10.3)
Chloride: 110 mmol/L (ref 98–111)
Chloride: 110 mmol/L (ref 98–111)
Creatinine, Ser: 1.62 mg/dL — ABNORMAL HIGH (ref 0.44–1.00)
Creatinine, Ser: 1.71 mg/dL — ABNORMAL HIGH (ref 0.44–1.00)
GFR, Estimated: 31 mL/min — ABNORMAL LOW (ref >60)
GFR, Estimated: 29 mL/min — ABNORMAL LOW (ref >60)
Glucose, Bld: 148 mg/dL — ABNORMAL HIGH (ref 70–99)
Glucose, Bld: 164 mg/dL — ABNORMAL HIGH (ref 70–99)
Potassium: 4.1 mmol/L (ref 3.5–5.1)
Potassium: 4.3 mmol/L (ref 3.5–5.1)
Sodium: 141 mmol/L (ref 135–145)
Sodium: 140 mmol/L (ref 135–145)
Total Bilirubin: 0.6 mg/dL (ref 0.3–1.2)
Total Bilirubin: 0.6 mg/dL (ref 0.3–1.2)
Total Protein: 5.4 g/dL — ABNORMAL LOW (ref 6.5–8.1)
Total Protein: 5.5 g/dL — ABNORMAL LOW (ref 6.5–8.1)

## 2020-09-20 LAB — URINALYSIS, ROUTINE W REFLEX MICROSCOPIC
Bacteria, UA: NONE SEEN
Bilirubin Urine: NEGATIVE
Glucose, UA: NEGATIVE mg/dL
Ketones, ur: 5 mg/dL — AB
Nitrite: NEGATIVE
Protein, ur: 100 mg/dL — AB
RBC / HPF: >50 RBC/hpf — ABNORMAL HIGH (ref 0–5)
Specific Gravity, Urine: 1.033 — ABNORMAL HIGH (ref 1.005–1.030)
WBC, UA: >50 WBC/hpf — ABNORMAL HIGH (ref 0–5)
pH: 5 (ref 5.0–8.0)

## 2020-09-20 LAB — CBC
HCT: 29.9 % — ABNORMAL LOW (ref 36.0–46.0)
HCT: 30.2 % — ABNORMAL LOW (ref 36.0–46.0)
Hemoglobin: 8.9 g/dL — ABNORMAL LOW (ref 12.0–15.0)
Hemoglobin: 8.9 g/dL — ABNORMAL LOW (ref 12.0–15.0)
MCH: 29.5 pg (ref 26.0–34.0)
MCH: 29 pg (ref 26.0–34.0)
MCHC: 29.8 g/dL — ABNORMAL LOW (ref 30.0–36.0)
MCHC: 29.5 g/dL — ABNORMAL LOW (ref 30.0–36.0)
MCV: 99 fL (ref 80.0–100.0)
MCV: 98.4 fL (ref 80.0–100.0)
Platelets: 127 10*3/uL — ABNORMAL LOW (ref 150–400)
Platelets: 149 10*3/uL — ABNORMAL LOW (ref 150–400)
RBC: 3.02 MIL/uL — ABNORMAL LOW (ref 3.87–5.11)
RBC: 3.07 MIL/uL — ABNORMAL LOW (ref 3.87–5.11)
RDW: 15.7 % — ABNORMAL HIGH (ref 11.5–15.5)
RDW: 15.7 % — ABNORMAL HIGH (ref 11.5–15.5)
WBC: 17.1 10*3/uL — ABNORMAL HIGH (ref 4.0–10.5)
WBC: 19.3 10*3/uL — ABNORMAL HIGH (ref 4.0–10.5)
nRBC: 0 % (ref 0.0–0.2)
nRBC: 0 % (ref 0.0–0.2)

## 2020-09-20 LAB — GLUCOSE, CAPILLARY
Glucose-Capillary: 117 mg/dL — ABNORMAL HIGH (ref 70–99)
Glucose-Capillary: 124 mg/dL — ABNORMAL HIGH (ref 70–99)
Glucose-Capillary: 129 mg/dL — ABNORMAL HIGH (ref 70–99)
Glucose-Capillary: 131 mg/dL — ABNORMAL HIGH (ref 70–99)
Glucose-Capillary: 139 mg/dL — ABNORMAL HIGH (ref 70–99)
Glucose-Capillary: 140 mg/dL — ABNORMAL HIGH (ref 70–99)

## 2020-09-20 LAB — SURGICAL PATHOLOGY

## 2020-09-20 LAB — HCV INTERPRETATION

## 2020-09-20 LAB — HCV AB W REFLEX TO QUANT PCR: HCV Ab: 0.2 s/co ratio (ref 0.0–0.9)

## 2020-09-20 MED ORDER — ENOXAPARIN SODIUM 30 MG/0.3ML ~~LOC~~ SOLN
30.0000 mg | SUBCUTANEOUS | Status: DC
  Administered 2020-09-20 – 2020-10-03 (×10): 30 mg via SUBCUTANEOUS
  Filled 2020-09-20 (×11): qty 0.3

## 2020-09-20 MED ORDER — ALBUMIN HUMAN 25 % IV SOLN
25.0000 g | Freq: Once | INTRAVENOUS | Status: AC
  Administered 2020-09-20: 25 g via INTRAVENOUS
  Filled 2020-09-20: qty 100

## 2020-09-20 MED ORDER — LACTATED RINGERS IV BOLUS
500.0000 mL | Freq: Once | INTRAVENOUS | Status: AC
  Administered 2020-09-20: 500 mL via INTRAVENOUS

## 2020-09-20 MED ORDER — ACETAMINOPHEN 325 MG PO TABS: 650.0000 mg | ORAL_TABLET | Freq: Four times a day (QID) | ORAL | Status: DC | PRN

## 2020-09-20 MED ORDER — SODIUM CHLORIDE 0.9 % IV SOLN
1.0000 g | Freq: Two times a day (BID) | INTRAVENOUS | Status: AC
  Administered 2020-09-20 – 2020-09-29 (×19): 1 g via INTRAVENOUS
  Filled 2020-09-20 (×19): qty 1

## 2020-09-20 NOTE — Progress Notes (Signed)
SLP Cancellation Note  Patient Details Name: Slyvia Lartigue MRN: [id number] DOB: Mar 22, 1935   Cancelled treatment:       Reason Eval/Treat Not Completed: Medical issues which prohibited therapy. RN asked therapist to defer -pt vomited recently and is going to CT this afternoon and would like to avoid possible emesis prior to CT. Will see tomorrow for swallow assessment.    Houston Siren 09/20/2020, 3:10 PM  Orbie Pyo Colvin Caroli.Ed Risk analyst [phone] Office [phone]

## 2020-09-20 NOTE — Progress Notes (Signed)
2 Days Post-Op  Subjective: Patient tells daughter that she can not hear this am, acutely.  Her pain in the RLQ is improving each day.  Drinking some of her homemade drink this morning.    ROS: See above, otherwise other systems negative  Objective: Vital signs in last 24 hours: Temp:  [97.7 F (36.5 C)-98.6 F (37 C)] 98.4 F (36.9 C) (11/10 0957) Pulse Rate:  [86-106] 106 (11/10 1022) Resp:  [15-17] 16 (11/10 0957) BP: (75-95)/(26-68) 75/26 (11/10 1022) SpO2:  [90 %-95 %] 95 % (11/10 0957) Last BM Date: 09/19/20  Intake/Output from previous day: 11/09 0701 - 11/10 0700 In: 75 [P.O.:580] Out: 950 [Urine:950] Intake/Output this shift: Total I/O In: -  Out: 300 [Urine:300]  PE: Gen: alert and having trouble hearing her daughter who is practically yelling at her.  Abd: soft, less tender on right side, ND, incisions c/d/i, no peritonitis or evidence of infection  Lab Results:  Recent Labs    09/19/20 0502 09/20/20 0240  WBC 18.8* 17.1*  HGB 10.1* 8.9*  HCT 33.5* 29.9*  PLT 144* 127*   BMET Recent Labs    09/19/20 0502 09/20/20 0240  NA 143 141  K 4.2 4.1  CL 111 110  CO2 26 24  GLUCOSE 184* 148*  BUN 19 19  CREATININE 1.31* 1.62*  CALCIUM 7.7* 7.5*   PT/INR Recent Labs    09/19/20 1201  LABPROT 17.6*  INR 1.5*   CMP     Component Value Date/Time   NA 141 09/20/2020 0240   K 4.1 09/20/2020 0240   CL 110 09/20/2020 0240   CO2 24 09/20/2020 0240   GLUCOSE 148 (H) 09/20/2020 0240   BUN 19 09/20/2020 0240   CREATININE 1.62 (H) 09/20/2020 0240   CALCIUM 7.5 (L) 09/20/2020 0240   PROT 5.4 (L) 09/20/2020 0240   ALBUMIN 1.2 (L) 09/20/2020 0240   AST 24 09/20/2020 0240   ALT 12 09/20/2020 0240   ALKPHOS 123 09/20/2020 0240   BILITOT 0.6 09/20/2020 0240   GFRNONAA 31 (L) 09/20/2020 0240   GFRAA 42 (L) 05/27/2020 1033   Lipase     Component Value Date/Time   LIPASE 14 09/18/2020 0800       Studies/Results: No results  found.  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  Defer to medicine on thoughts on this.  POD 2, s/p lap appy for mild appendicitis -patient's appendix looked maybe only mildly inflamed.  This was removed.  However, she was noted to have a thin white fluid noted on the right side of her abdomen during surgery of unclear etiology.  This reportedly had no odor, etc. -it is unclear whether her appendix was actually the source of her symptoms  -her pain however, seems improved.  She is drinking  some this morning for breakfast.  Unclear how much she has eaten as daughter wasn't here yesterday and patient can't seem to understand daughter today to answer this questions.  -denies nausea. -patient is surgically stable following her lap appy. -will defer further medical care to medical team -we will sign off.  She has follow up arranged in our office, but daughter is unsure if they can make it given her bedbound state  ID - rocephin/flagyl preop, none post op FEN - carb mod diet VTE - SCDs, Lovenox Foley - none Follow up - [facility] clinic in 3 weeks    LOS: 2 days    Henreitta Cea , [facility] Surgery 09/20/2020, 10:45 AM Please see Amion for pager number during day hours 7:00am-4:30pm or 7:00am -11:30am on weekends

## 2020-09-20 NOTE — Progress Notes (Signed)
Subjective:   [name] is a 85 year old female with past medical history of IDDM, CKD3, hypoalbuminemia, chronic right hip fracture, recent admission for emphysematous cystitis and left-sided emphysematous pyelitis with ESBL klebsiella bacteremia who presented to [facility] on 11/08 with right lower quadrant abdominal pain found to have acute appendicitis s/p appendectomy on 11/08.  Overnight, no acute events.  This morning, daughter reports that patient seems to have diminished hearing in both of her ears as well as some confusion. Additionally, patient is endorsing back pain and right-sided abdominal pain this morning. Discussed additional findings during her surgery and possibility of developing cirrhosis with family. All other questions and concerns addressed.  Objective:  Vital signs in last 24 hours: Vitals:   09/20/20 1022 09/20/20 1132 09/20/20 1234 09/20/20 1356  BP: (!) 75/26 (!) 81/34 (!) 94/42 (!) 94/56  Pulse: (!) 106  (!) 103 (!) 106  Resp:   16 18  Temp:   98.1 F (36.7 C) 98 F (36.7 C)  TempSrc:   Oral Oral  SpO2:   93% 93%  Weight:      Height:       Physical Exam Vitals and nursing note reviewed. Exam conducted with a chaperone present.  Constitutional:      General: She is not in acute distress.    Appearance: She is obese.  HENT:     Head: Normocephalic and atraumatic.  Cardiovascular:     Rate and Rhythm: Normal rate and regular rhythm.     Heart sounds: Normal heart sounds.  Pulmonary:     Effort: Pulmonary effort is normal. No respiratory distress.     Breath sounds: Normal breath sounds.  Abdominal:     General: Abdomen is flat. Bowel sounds are normal. There is no distension.     Palpations: Abdomen is soft.     Tenderness: There is abdominal tenderness in the right upper quadrant, right lower quadrant and suprapubic area.     Comments: Surgical incisions clean, dry with no drainage  Musculoskeletal:     Right lower leg: 3+ Edema present.      Left lower leg: 3+ Edema present.  Skin:    General: Skin is warm and dry.     Capillary Refill: Capillary refill takes less than 2 seconds.  Neurological:     General: No focal deficit present.     Mental Status: She is alert and oriented to person, place, and time.  Psychiatric:        Mood and Affect: Mood normal.        Behavior: Behavior normal.    CBC Latest Ref Rng & Units 09/20/2020 09/20/2020 09/19/2020  WBC 4.0 - 10.5 K/uL 19.3(H) 17.1(H) 18.8(H)  Hemoglobin 12.0 - 15.0 g/dL 8.9(L) 8.9(L) 10.1(L)  Hematocrit 36 - 46 % 30.2(L) 29.9(L) 33.5(L)  Platelets 150 - 400 K/uL 149(L) 127(L) 144(L)   CMP Latest Ref Rng & Units 09/20/2020 09/20/2020 09/19/2020  Glucose 70 - 99 mg/dL 164(H) 148(H) 184(H)  BUN 8 - 23 mg/dL 22 19 19   Creatinine 0.44 - 1.00 mg/dL 1.71(H) 1.62(H) 1.31(H)  Sodium 135 - 145 mmol/L 140 141 143  Potassium 3.5 - 5.1 mmol/L 4.3 4.1 4.2  Chloride 98 - 111 mmol/L 110 110 111  CO2 22 - 32 mmol/L 23 24 26   Calcium 8.9 - 10.3 mg/dL 7.4(L) 7.5(L) 7.7(L)  Total Protein 6.5 - 8.1 g/dL 5.5(L) 5.4(L) 6.1(L)  Total Bilirubin 0.3 - 1.2 mg/dL 0.6 0.6 0.5  Alkaline Phos 38 -  126 U/L 117 123 132(H)  AST 15 - 41 U/L 27 24 25   ALT 0 - 44 U/L 11 12 12    Hepatitis B core Antibody - reactive Hepatitis B surface antibody - reactive Hepatitis B surface antigen - non reactive Hepatitis C antibody - 0.2 (negative) INR - 1.5  IMAGING: CT ABDOMEN PELVIS W CONTRAST  Result Date: 09/18/2020 CLINICAL DATA:  Abdominal pain, nausea, vomiting EXAM: CT ABDOMEN AND PELVIS WITH CONTRAST TECHNIQUE: Multidetector CT imaging of the abdomen and pelvis was performed using the standard protocol following bolus administration of intravenous contrast. CONTRAST:  72mL OMNIPAQUE IOHEXOL 300 MG/ML  SOLN COMPARISON:  08/20/2020 FINDINGS: Lower chest: Coronary calcifications. Increase in small bilateral pleural effusions. Stable bleb at the right lung base. Increase in interstitial opacities in the  lung bases, and patchy consolidation/atelectasis posteriorly at the right lung base. Hepatobiliary: Mildly nodular hepatic contour without focal lesion. Several partially calcified stones in the dependent aspect of the lumen of the mildly distended gallbladder. No biliary ductal dilatation. Pancreas: Advanced parenchymal atrophy without focal lesion or ductal dilatation. Spleen: Normal in size without focal abnormality. Adrenals/Urinary Tract: Adrenal glands unremarkable. Focal parenchymal loss in the upper pole right kidney, stable. Left double-J ureteral stent in good position. Mild fullness of the central renal collecting system and proximal ureterectasis without overt hydronephrosis. Urinary bladder is distended containing small amount of gas suggesting recent instrumentation. 2.7 cm exophytic probable cyst from the lower pole left kidney. Stomach/Bowel: The stomach is nondilated. The small bowel is decompressed. Appendix: Location: Retrocecal Diameter: 9 mm Appendicolith: None seen Mucosal hyper-enhancement: Indeterminate Extraluminal gas: None seen Periappendiceal collection: No loculated collection The colon is non distended. There is fecal dilatation of the rectum up to 7.9 cm. Vascular/Lymphatic: Aorta unremarkable. Infrarenal IVC filter with caval wall penetration by filter legs involving the aorta and L3 vertebral body. Stable subcentimeter left para-aortic and aortocaval lymph nodes. For no pelvic adenopathy. Prominent supraceliac and gastrohepatic ligament lymph nodes measuring up to 0.9 cm short axis diameter. Reproductive: Stable coarse uterine calcifications. No adnexal mass. Other: New small volume abdominal ascites predominantly perihepatic, perisplenic, and left lower quadrant. No free air. Musculoskeletal: Ventral hernia containing only mesenteric fat. Stable 3.8 cm presumed injection granuloma posterior to the left SI joint. Stable L1 and L3 compression deformities. Chronic fracture-dislocation  of the right hip resorption of much of the femoral head. Left hip arthroplasty components are partially visualized, with chronic protrusio and attempts at cement restoration. No acute fracture or worrisome bone lesion. IMPRESSION: 1. Dilated appendix with equivocal mucosal hyper-enhancement, may represent early appendicitis, without evidence of perforation or abscess. 2. New small volume abdominal ascites. 3. Increase in small bilateral pleural effusions. 4. Cholelithiasis. 5. Coronary calcifications. 6. Additional chronic findings as above. Aortic Atherosclerosis (ICD10-I70.0). Electronically Signed   By: Lucrezia Europe M.D.   On: 09/18/2020 08:17   Assessment/Plan:  Active Problems:   Appendicitis   S/P appendectomy  [name] is a 85 year old female with past medical history of IDDM, CKD3, hypoalbuminemia, chronic right hip fracture, recent admission for emphysematous cystitis and left-sided emphysematous pyelitis with ESBL klebsiella bacteremia who presented to [facility] on 11/08 with right lower quadrant abdominal pain found to have acute appendicitis s/p appendectomy on 11/08.  #Appendicitis s/p appendectomy Patient presented with right lower quadrant abdominal pain with evidence of early appendicitis on CT abdomen/pelvis. Patient underwent same-day appendectomy with general surgery on 96/78 without complication. The appendix was noted to be mildly inflamed and set to pathology for  further evaluation. Patient continues to have abdominal pain in the right lower and right middle quadrant following the operation. -Acetaminophen 650mg  Q6H PRN -Oxycodone 5mg  Q4H PRN -Morphine 1-4mg  Q4H PRN for breakthrough pain -Zofran 4mg  Q6H PRN -Daily CBC -follow-up surgical pathology -Surgery follow-up arranged  #Anasarca, chronic #Hypoalbuminemia, chronic #Cirrhosis possibly 2/2 prior Hepatitis B infection, chronic Patient has anasarca in the setting of her profound hypoalbuminemia (1.3). During patient's  appendectomy, her liver was noticed to have a nodular texture concerning for cirrhosis. LFTs within normal limits, however alkaline phosphatase mildly elevated to 132. INR elevated to 1.5. Patient with reactive hepatitis B core antibody and surface antibody suggesting prior hepatitis B infection. Patient will require further workup of this condition in the outpatient setting with gastroenterology. -RD consulted appreciate recommendations for protein supplementation -Patient hypotensive today, will infuse with 25g of albumin 25% and 1L of LR. Patient may benefit from repeat dosing of albumin and LR if becomes progressively hypotensive overnight. -Follow-up with gastroenterology in outpatient setting -Speech eval to ensure appropriate diet recommendations  #Urinary tract infection Patient with acute rise in white blood cell count from 8.0 to 18.8. Although a component may be reactive in the setting of her recent operation, her urinalysis is concerning for associated urinary tract infection and she has suprapubic tenderness on physical examination. -Meropenem per pharmacy consult -Follow-up urine culture -Daily CBC  #AoCKD 3a, chronic Patient's creatinine elevated to 1.7, 1.6 from baseline of 1.3 on morning labs. May be secondary to urinary tract infection versus volume depletion following surgery. -Meropenem per pharmacy consult -Follow-up urine culture -1L LR bolus followed by 25g albumin 25% -Monitor creatinine on daily CMP  #IDDM, chronic Patient prescribed 5u lantus nightly at home. CBGs well controlled on current regimen. -Continue sensitive SSI -CBGs AC and HS  #VTE ppx: Lovenox 30mg  daily #Diet: Carb modified, consult to registered dietitian #Bowel regimen: Miralax daily PRN #Code status: Full code #PT/OT recs: No PT/OT follow up; supervision/assistance - 24 hour  Cato Mulligan, MD 09/20/2020, 2:58 PM Pager: [phone] After 5pm on weekdays and 1pm on weekends: On Call  pager [phone]

## 2020-09-20 NOTE — Progress Notes (Signed)
Pharmacy Antibiotic Note  Whitney [name] is a 85 y.o. female admitted on 09/18/2020 with abdominal pain, found to have appendicitis now s/p appendectomy on 09/18/20.  Pharmacy has been consulted for Merrem dosing for rule out UTI.  Patient has a history of ESBL Klebsiella bacteremia and emphysematous pyelitis.  SCr 1.31 > 1.62, CrCL 28 ml/min, afebrile, WBC 17.1.  Plan: Merrem 1gm IV Q12H Monitor renal fxn, micro data Reduce Lovenox to 30mg  SQ daily for CrCL < 30 ml/min  Height: 5\' 3"  (160 cm) Weight: 90 kg (198 lb 6.6 oz) IBW/kg (Calculated) : 52.4  Temp (24hrs), Avg:98.2 F (36.8 C), Min:97.7 F (36.5 C), Max:98.6 F (37 C)  Recent Labs  Lab 09/18/20 0800 09/19/20 0502 09/20/20 0240  WBC 8.0 18.8* 17.1*  CREATININE 1.41* 1.31* 1.62*  LATICACIDVEN 0.9  --   --     Estimated Creatinine Clearance: 28 mL/min (A) (by C-G formula based on SCr of 1.62 mg/dL (H)).    Allergies  Allergen Reactions  . Succinylcholine Other (See Comments)    No formal diagnosis of pseudocholinesterase deficiency; however, prolonged paralysis during anesthetics on 09/18/20 and 08/18/20.    CTX x1 11/8 Flagyl x1 11/8 Merrem 11/10 >>  11/10 UCx -   Kaspar Albornoz D. Mina Marble, PharmD, BCPS, [place] 09/20/2020, 11:00 AM

## 2020-09-20 NOTE — Progress Notes (Signed)
   09/20/20 1022  Assess: MEWS Score  BP (!) 75/26  Pulse Rate (!) 106  Assess: MEWS Score  MEWS Temp 0  MEWS Systolic 2  MEWS Pulse 1  MEWS RR 0  MEWS LOC 0  MEWS Score 3  MEWS Score Color Yellow  Assess: if the MEWS score is Yellow or Red  Were vital signs taken at a resting state? Yes  Focused Assessment Change from prior assessment (see assessment flowsheet)  Early Detection of Sepsis Score *See Row Information* Low  MEWS guidelines implemented *See Row Information* No, vital signs rechecked  Take Vital Signs  Increase Vital Sign Frequency  Yellow: Q 2hr X 2 then Q 4hr X 2, if remains yellow, continue Q 4hrs  Escalate  MEWS: Escalate Yellow: discuss with charge nurse/RN and consider discussing with provider and RRT  Notify: Charge Nurse/RN  Name of Charge Nurse/RN Notified Otila Kluver  Date Charge Nurse/RN Notified 09/20/20  Time Charge Nurse/RN Notified 1032  Notify: Provider  Provider Name/Title Wynetta Emery  Date Provider Notified 09/20/20  Time Provider Notified 1032  Notification Type Page  Notification Reason Change in status  Response See new orders  Date of Provider Response 09/20/20  Time of Provider Response [phone]

## 2020-09-21 ENCOUNTER — Inpatient Hospital Stay (HOSPITAL_COMMUNITY): Payer: 59

## 2020-09-21 LAB — COMPREHENSIVE METABOLIC PANEL
ALT: 12 U/L (ref 0–44)
AST: 21 U/L (ref 15–41)
Albumin: 1.5 g/dL — ABNORMAL LOW (ref 3.5–5.0)
Alkaline Phosphatase: 101 U/L (ref 38–126)
Anion gap: 7 (ref 5–15)
BUN: 22 mg/dL (ref 8–23)
CO2: 23 mmol/L (ref 22–32)
Calcium: 7.4 mg/dL — ABNORMAL LOW (ref 8.9–10.3)
Chloride: 111 mmol/L (ref 98–111)
Creatinine, Ser: 1.83 mg/dL — ABNORMAL HIGH (ref 0.44–1.00)
GFR, Estimated: 27 mL/min — ABNORMAL LOW (ref >60)
Glucose, Bld: 146 mg/dL — ABNORMAL HIGH (ref 70–99)
Potassium: 3.9 mmol/L (ref 3.5–5.1)
Sodium: 141 mmol/L (ref 135–145)
Total Bilirubin: 0.7 mg/dL (ref 0.3–1.2)
Total Protein: 4.9 g/dL — ABNORMAL LOW (ref 6.5–8.1)

## 2020-09-21 LAB — CBC
HCT: 24.8 % — ABNORMAL LOW (ref 36.0–46.0)
Hemoglobin: 7.5 g/dL — ABNORMAL LOW (ref 12.0–15.0)
MCH: 29.2 pg (ref 26.0–34.0)
MCHC: 30.2 g/dL (ref 30.0–36.0)
MCV: 96.5 fL (ref 80.0–100.0)
Platelets: 112 10*3/uL — ABNORMAL LOW (ref 150–400)
RBC: 2.57 MIL/uL — ABNORMAL LOW (ref 3.87–5.11)
RDW: 15.2 % (ref 11.5–15.5)
WBC: 11.2 10*3/uL — ABNORMAL HIGH (ref 4.0–10.5)
nRBC: 0 % (ref 0.0–0.2)

## 2020-09-21 LAB — GLUCOSE, CAPILLARY
Glucose-Capillary: 100 mg/dL — ABNORMAL HIGH (ref 70–99)
Glucose-Capillary: 114 mg/dL — ABNORMAL HIGH (ref 70–99)
Glucose-Capillary: 135 mg/dL — ABNORMAL HIGH (ref 70–99)
Glucose-Capillary: 139 mg/dL — ABNORMAL HIGH (ref 70–99)
Glucose-Capillary: 146 mg/dL — ABNORMAL HIGH (ref 70–99)
Glucose-Capillary: 184 mg/dL — ABNORMAL HIGH (ref 70–99)

## 2020-09-21 MED ORDER — INSULIN ASPART 100 UNIT/ML ~~LOC~~ SOLN
0.0000 [IU] | Freq: Three times a day (TID) | SUBCUTANEOUS | Status: DC
  Administered 2020-09-22 (×2): 1 [IU] via SUBCUTANEOUS
  Administered 2020-09-22: 2 [IU] via SUBCUTANEOUS
  Administered 2020-09-23: 3 [IU] via SUBCUTANEOUS
  Administered 2020-09-23: 1 [IU] via SUBCUTANEOUS
  Administered 2020-09-23: 2 [IU] via SUBCUTANEOUS
  Administered 2020-09-23: 1 [IU] via SUBCUTANEOUS
  Administered 2020-09-24: 3 [IU] via SUBCUTANEOUS
  Administered 2020-09-24: 5 [IU] via SUBCUTANEOUS
  Administered 2020-09-24 (×2): 3 [IU] via SUBCUTANEOUS
  Administered 2020-09-25: 2 [IU] via SUBCUTANEOUS

## 2020-09-21 MED ORDER — LACTATED RINGERS IV SOLN: INTRAVENOUS | Status: AC

## 2020-09-21 MED ORDER — ACETAMINOPHEN 325 MG PO TABS: 650.0000 mg | ORAL_TABLET | Freq: Four times a day (QID) | ORAL | Status: DC | PRN

## 2020-09-21 NOTE — Progress Notes (Signed)
Nutrition Follow-up  DOCUMENTATION CODES:   Obesity unspecified  INTERVENTION:   -Continue MVI with minerals daily -Continue Boost Breeze po TID, each supplement provides 250 kcal and 9 grams of protein  NUTRITION DIAGNOSIS:   Inadequate oral intake related to poor appetite as evidenced by per patient/family report, meal completion < 25%.  Ongoing  GOAL:   Patient will meet greater than or equal to 90% of their needs  Progressing   MONITOR:   PO intake, Supplement acceptance  REASON FOR ASSESSMENT:   Consult Assessment of nutrition requirement/status  ASSESSMENT:   Pt with PMH of IDDM, CKD3, and recent admission for pyelonephritis (08/18/20). Admitted with RLQ abdominal pain found to have acute appendicitis. Appendectomy on 11/8.  11/11- s/p MBSS- downgraded to regular diet with nectar thick liquids  Reviewed I/O's: -171 ml x 24 hours and +1.1 L since admission  UOP: 325 ml x 24 hours  Pt remains with poor appetite and abdominal pain. Noted meal completion 0-40%. Pt is consuming Boost Breeze supplements. She is hesitant to try milky type supplements, as they have caused diarrhea in the past. She has been downgraded to nectar thick liquids by speech therapy.   Labs reviewed: CBGS: 100-114 (inpatient orders for glycemic control are 0-9 units insulin aspart every 4 hours).   Diet Order:   Diet Order            Diet Carb Modified Fluid consistency: Nectar Thick; Room service appropriate? Yes with Assist  Diet effective 1400                 EDUCATION NEEDS:   Education needs have been addressed  Skin:  Skin Assessment: Reviewed RN Assessment  Last BM:  09/21/20  Height:   Ht Readings from Last 1 Encounters:  09/18/20 5\' 3"  (1.6 m)    Weight:   Wt Readings from Last 1 Encounters:  09/21/20 82.1 kg    Ideal Body Weight:  115 kg  BMI:  Body mass index is 32.06 kg/m.  Estimated Nutritional Needs:   Kcal:  1600-1800  Protein:  85-100  grams  Fluid:  >/= 1.6L/day    Loistine Chance, RD, LDN, [place] Registered Dietitian II Certified Diabetes Care and Education Specialist Please refer to [facility] for RD and/or RD on-call/weekend/after hours pager

## 2020-09-21 NOTE — Plan of Care (Signed)
  Problem: Education: Goal: Knowledge of General Education information will improve Description Including pain rating scale, medication(s)/side effects and non-pharmacologic comfort measures Outcome: Progressing   

## 2020-09-21 NOTE — Significant Event (Signed)
Rapid Response Event Note   Reason for Call :  Called for afib rvr, HR 117.   Initial Focused Assessment:  Pt lying in bed, alert. She does not appear to be distressed. Lung sounds are clear. Mild accessory muscle use noted by pt abdominal breathing. Tachypneic, but able to speak sentences. Skin is warm, dry.   VS: T 97.26F, BP 123/59, HR 115, RR 25, 98% on 2LNC  Interventions:  No intervention from [place] of Care:  -IV team consulted for IV access- maintain at least 1 IV access -Maintain oxygen saturation >93% -Trend VS, notify if pt becomes symptomatic from elevated heart rate  Call rapid response for additional needs.  Event Summary:  MD Notified: IMTS Call Time: [place] Time: 1610 End Time: [place], RN

## 2020-09-21 NOTE — Significant Event (Signed)
I was paged and notified that patient has elevated heart rate in the 110s-120s.  EKG was obtained and shows A. fib with RVR.  I came and evaluated patient at bedside.  She appears comfortable and in no acute distress.  She denies chest pain or shortness of breath.  Her blood pressure 120s/50s with pulse 75.  O2 sat 96% on 2 L.  Normal rate with irregular rhythm on heart auscultation.  Anterior lung sound normal.  EKG show A. fib with RVR with incomplete RBBB.  Per chart review, her EKG in 11/8/121 showed RBBB and EKG 08/18/2020 showed A. fib with RBBB.  Patient is currently hemodynamically stable.  We will continue to monitor telemetry.

## 2020-09-21 NOTE — Evaluation (Signed)
Clinical/Bedside Swallow Evaluation Patient Details  Name: [name] MRN: [id number] Date of Birth: 1935/10/16  Today's Date: 09/21/2020 Time: SLP Start Time (ACUTE ONLY): 1100 SLP Stop Time (ACUTE ONLY): 1121 SLP Time Calculation (min) (ACUTE ONLY): 21 min  Past Medical History:  Past Medical History:  Diagnosis Date  . CKD (chronic kidney disease)   . Complication of anesthesia    PER FAMILY PATIENT HAD A SEIZURE WHEN " THEY PUT A ROD IN HER LEG "  . Diabetes mellitus without complication [name])    Past Surgical History:  Past Surgical History:  Procedure Laterality Date  . APPENDECTOMY  09/18/2020  . CYSTOSCOPY W/ URETERAL STENT PLACEMENT Left 08/18/2020   Procedure: CYSTOSCOPY WITH RETROGRADE PYELOGRAM/URETERAL STENT PLACEMENT;  Surgeon: [name], MD;  Location: [name];  Service: Urology;  Laterality: Left;  . IR FLUORO GUIDE CV LINE RIGHT  08/23/2020  . IR REMOVAL TUN CV CATH W/O FL  08/31/2020  . IR US GUIDE VASC ACCESS RIGHT  08/23/2020  . LAPAROSCOPIC APPENDECTOMY N/A 09/18/2020   Procedure: APPENDECTOMY LAPAROSCOPIC;  Surgeon: Coralie Keens, MD;  Location: [place];  Service: General;  Laterality: N/A;  . TOTAL HIP ARTHROPLASTY Right 03/05/2020   Procedure: HIP GIRDLE STONE;  Surgeon: Leandrew Koyanagi, MD;  Location: [place];  Service: Orthopedics;  Laterality: Right;   HPI:  [name] is a 85 yo F w/ PMH of IDDm, CKD3, Hypoalbuminemia, Chronic R hip fracture and recent admission for pyelonephritis with ESBL kleb bacteremia presenting to [name] with abdominal pain due to appendicitis. Seen by SLP in 10/21: recommended dys 1 diet with thin liquids 2/2 instances of coughing and multiple swallows. SLP s/o as pt appeared to be at baseline functioning. CT abdomen: Lungs: Bibasilar atelectasis and effusions.    Assessment / Plan / Recommendation Clinical Impression  Pt was seen for bedside swallow evaluation with her daughter present. Pt is a monolingual speaker of Arabic, but the  dialect which she speaks has been unavailable via [employer] language services and pt has had difficulty hearing the video interpreters. Interpretation and translation were therefore provided by the pt's daughter. Pt's daughter initially denied the pt having any difficulty swallowing, but subsequently stated that she has had "problems with her esophagus" since birth. Oral mechanism exam was [name] and dentition was reduced. Pt was partially reclined during the evaluation and was resistant to sitting upright despite education. The impact of this on her performance is considered. She tolerated solids without overt s/sx of aspiration, but exhibited throat clearing with consecutive swallows of thin liquids via cup and coughing with individual sips via straw, suggesting aspiration. Her swallow mechanism appeared otherwise WFL. Her swallow function appears similar to that which was noted during the last sessions in October, but pt's daughter denied the pt having any signs of aspiration at baseline. A modified barium swallow study is recommended to further assess swallow function.  SLP Visit Diagnosis: Dysphagia, unspecified (R13.10)    Aspiration Risk  Moderate aspiration risk;Mild aspiration risk (due to positioning)    Diet Recommendation  (Deferred until MBS completed)   Medication Administration: Whole meds with puree    Other  Recommendations Oral Care Recommendations: Oral care BID   Follow up Recommendations  (TBD)      Frequency and Duration min 2x/week  2 weeks       Prognosis Prognosis for Safe Diet Advancement: Good Barriers to Reach Goals: Other (Comment) (Cooperation)      Swallow Study   General Date of Onset:  09/20/20 HPI: [name] is a 85 yo F w/ PMH of IDDm, CKD3, Hypoalbuminemia, Chronic R hip fracture and recent admission for pyelonephritis with ESBL kleb bacteremia presenting to [facility] with abdominal pain due to appendicitis. Seen by SLP in 10/21: recommended dys 1 diet with thin liquids  2/2 instances of coughing and multiple swallows. SLP s/o as pt appeared to be at baseline functioning. CT abdomen: Lungs: Bibasilar atelectasis and effusions.  Type of Study: Bedside Swallow Evaluation Previous Swallow Assessment: See HPI Diet Prior to this Study: Regular;Thin liquids Temperature Spikes Noted: No Respiratory Status: Nasal cannula History of Recent Intubation: No Behavior/Cognition: Alert;Requires cueing;Cooperative Oral Cavity Assessment: Within Functional Limits Oral Care Completed by SLP: No Oral Cavity - Dentition: Poor condition;Missing dentition Vision: Functional for self-feeding Self-Feeding Abilities: Needs assist Patient Positioning: Partially reclined Baseline Vocal Quality: Normal Volitional Cough: Strong Volitional Swallow: Able to elicit    Oral/Motor/Sensory Function Overall Oral Motor/Sensory Function: Within functional limits   Ice Chips Ice chips: Within functional limits Presentation: Spoon   Thin Liquid Thin Liquid: Impaired Presentation: Straw;Cup Pharyngeal  Phase Impairments: Cough - Immediate;Throat Clearing - Immediate    Nectar Thick Nectar Thick Liquid: Not tested   Honey Thick Honey Thick Liquid: Not tested   Puree Puree: Within functional limits Presentation: Spoon   Solid     Solid: Within functional limits Presentation: [name] I. [name], [name], [name] Office number [phone] Pager [phone]  [name] 09/21/2020,11:34 AM

## 2020-09-21 NOTE — Progress Notes (Signed)
Modified Barium Swallow Progress Note  Patient Details  Name: [name] MRN: [id number] Date of Birth: 1935/03/01  Today's Date: 09/21/2020  Modified Barium Swallow completed.  Full report located under Chart Review in the Imaging Section.  Brief recommendations include the following:  Clinical Impression  Pt presents with pharyngeal dysphagia characterized by a pharyngeal delay which resulted in penetration (PAS 3,5) with thin liquids and penetration (PAS 2) with nectar thick liquids. Penetration of thin liquids inconsistently resulted in silent trace aspiration (PAS 8). Depth of penetration was improved with use of smaller bolus sizes but ultimate aspiration was still inconsistently demonstrated. It is noteworthy that the study was conducted under optimal conditions with the pt sitting fully upright. However, considering the pt and her daughter's reports of pt having p.o. intake in a reclined position, her risk of aspiration would be notably increased. It is anticipated that quantity and frequency of aspiration increases with pt in a reclined position. Mastication was prolonged with regular textures but this did not appear to significantly impact safety. Esophageal screening revealed retention of barium in the mid to lower thoracic esophagus; determination of the etiology of this is beyond the scope of this study; however, pt's daughter did report the pt having esophageal-related issues since birth. A regular texture diet with nectar thick liquids is recommended at this time. SLP will follow for dysphagia treatment.    Swallow Evaluation Recommendations       SLP Diet Recommendations: Regular solids;Nectar thick liquid   Liquid Administration via: Cup;Straw   Medication Administration: Whole meds with puree   Supervision: Staff to assist with self feeding   Compensations: Minimize environmental distractions;Slow rate;Small sips/bites   Postural Changes: Seated upright at 90  degrees   Oral Care Recommendations: Oral care BID      Channell Quattrone I. Hardin Negus, [place], [employer] Office number [phone] Pager [place] 09/21/2020,2:21 PM

## 2020-09-21 NOTE — Progress Notes (Signed)
   09/21/20 1701  Assess: MEWS Score  Temp 97.9 F (36.6 C)  BP (!) 123/59  Pulse Rate (!) 115  Resp (!) 25  Level of Consciousness Alert  SpO2 98 %  O2 Device Nasal Cannula  Patient Activity (if Appropriate) In bed  O2 Flow Rate (L/min) 2 L/min  Assess: MEWS Score  MEWS Temp 0  MEWS Systolic 0  MEWS Pulse 2  MEWS RR 1  MEWS LOC 0  MEWS Score 3  MEWS Score Color Yellow  Assess: if the MEWS score is Yellow or Red  Were vital signs taken at a resting state? Yes  Focused Assessment Change from prior assessment (see assessment flowsheet)  Early Detection of Sepsis Score *See Row Information* Low  MEWS guidelines implemented *See Row Information* No, vital signs rechecked  Take Vital Signs  Increase Vital Sign Frequency  Yellow: Q 2hr X 2 then Q 4hr X 2, if remains yellow, continue Q 4hrs  Escalate  MEWS: Escalate Yellow: discuss with charge nurse/RN and consider discussing with provider and RRT  Notify: Charge Nurse/RN  Name of Charge Nurse/RN Notified Blanch Media  Date Charge Nurse/RN Notified 09/21/20  Time Charge Nurse/RN Notified 4536  Notify: Provider  Provider Name/Title Alfonse Spruce  Date Provider Notified 09/21/20  Time Provider Notified 1705  Notification Type Page  Notification Reason Change in status  Response No new orders  Date of Provider Response 09/21/20  Time of Provider Response 1720  Notify: Rapid Response  Name of Rapid Response RN Notified [facility]  Date Rapid Response Notified 09/21/20  Time Rapid Response Notified 4680

## 2020-09-21 NOTE — Progress Notes (Signed)
Subjective:   [name] is a 85 year old female with past medical history of IDDM, CKD3, hypoalbuminemia, chronic right hip fracture, recent admission for emphysematous cystitis and left-sided emphysematous pyelitis with ESBL klebsiella bacteremia who presented to [facility] on 11/08 with right lower quadrant abdominal pain found to have acute appendicitis s/p appendectomy on 11/08.  Overnight, no acute events.  On initial evaluation this morning, patient pointing to abdomen expressing that she was in pain. On repeat evaluation with daughter present as translator, patient reportedly not in any pain. Otherwise, patient's daughter denies any additional complaints or concerns from patient. She has not had much to eat or drink in the past few days.  Objective:  Vital signs in last 24 hours: Vitals:   09/20/20 1814 09/20/20 2010 09/21/20 0300 09/21/20 0500  BP: (!) 100/51 (!) 100/51 (!) 98/44   Pulse: (!) 110 (!) 102 98   Resp: 18 17 17    Temp: 97.9 F (36.6 C) 98.1 F (36.7 C) 98.1 F (36.7 C)   TempSrc: Oral Oral Axillary   SpO2: 95% 100% 100%   Weight:    82.1 kg  Height:      SpO2: 100 % O2 Flow Rate (L/min): 2 L/min Filed Weights   09/18/20 1158 09/21/20 0500  Weight: 90 kg 82.1 kg    Intake/Output Summary (Last 24 hours) at 09/21/2020 0657 Last data filed at 09/20/2020 1700 Gross per 24 hour  Intake 153.91 ml  Output 325 ml  Net -171.09 ml   Physical Exam Vitals and nursing note reviewed. Exam conducted with a chaperone present.  Constitutional:      General: She is not in acute distress.    Appearance: She is obese.  HENT:     Head: Normocephalic and atraumatic.  Cardiovascular:     Rate and Rhythm: Normal rate and regular rhythm.     Heart sounds: Normal heart sounds.  Pulmonary:     Effort: Pulmonary effort is normal. No respiratory distress.     Breath sounds: Normal breath sounds.  Abdominal:     General: Abdomen is flat. Bowel sounds are normal. There  is no distension.     Palpations: Abdomen is soft.     Tenderness: There is abdominal tenderness in the right upper quadrant, right lower quadrant and suprapubic area.     Comments: Surgical incisions clean, dry with no drainage  Musculoskeletal:     Right lower leg: 3+ Edema present.     Left lower leg: 3+ Edema present.  Skin:    General: Skin is warm and dry.     Capillary Refill: Capillary refill takes less than 2 seconds.  Neurological:     General: No focal deficit present.     Mental Status: She is alert and oriented to person, place, and time.  Psychiatric:        Mood and Affect: Mood normal.        Behavior: Behavior normal.    CBC Latest Ref Rng & Units 09/21/2020 09/20/2020 09/20/2020  WBC 4.0 - 10.5 K/uL 11.2(H) 19.3(H) 17.1(H)  Hemoglobin 12.0 - 15.0 g/dL 7.5(L) 8.9(L) 8.9(L)  Hematocrit 36 - 46 % 24.8(L) 30.2(L) 29.9(L)  Platelets 150 - 400 K/uL 112(L) 149(L) 127(L)   CMP Latest Ref Rng & Units 09/21/2020 09/20/2020 09/20/2020  Glucose 70 - 99 mg/dL 146(H) 164(H) 148(H)  BUN 8 - 23 mg/dL 22 22 19   Creatinine 0.44 - 1.00 mg/dL 1.83(H) 1.71(H) 1.62(H)  Sodium 135 - 145 mmol/L 141 140 141  Potassium 3.5 - 5.1 mmol/L 3.9 4.3 4.1  Chloride 98 - 111 mmol/L 111 110 110  CO2 22 - 32 mmol/L 23 23 24   Calcium 8.9 - 10.3 mg/dL 7.4(L) 7.4(L) 7.5(L)  Total Protein 6.5 - 8.1 g/dL 4.9(L) 5.5(L) 5.4(L)  Total Bilirubin 0.3 - 1.2 mg/dL 0.7 0.6 0.6  Alkaline Phos 38 - 126 U/L 101 117 123  AST 15 - 41 U/L 21 27 24   ALT 0 - 44 U/L 12 11 12    Hepatitis B core Antibody - reactive Hepatitis B surface antibody - reactive Hepatitis B surface antigen - non reactive Hepatitis C antibody - 0.2 (negative) INR - 1.5  Urine culture - >100,000 klebsiella pneumoniae (sensitivities pending)  IMAGING: CT ABDOMEN PELVIS WO CONTRAST  Result Date: 09/20/2020 CLINICAL DATA:  RIGHT lower quadrant pain. Appendectomy September 18, 2020. EXAM: CT ABDOMEN AND PELVIS WITHOUT CONTRAST TECHNIQUE:  Multidetector CT imaging of the abdomen and pelvis was performed following the standard protocol without IV contrast. COMPARISON:  None. FINDINGS: Lower chest: Bibasilar atelectasis and effusions. Findings similar comparison exam 2 days prior. Hepatobiliary: Mild gallbladder distension similar to prior. Single dependent 1 cm gallstone. Small fluid surrounds the margin liver similar to prior. Pancreas: Pancreas is normal. No ductal dilatation. No pancreatic inflammation. Spleen: Normal spleen Adrenals/urinary tract: Adrenal glands normal. There is retained contrast within the kidneys from scan 2 days prior suggesting renal insufficiency. There is a double-J ureteral stent on the LEFT. No hydronephrosis. Foley catheter within collapsed bladder. Stomach/Bowel: Stomach, small bowel cecum normal. Post appendectomy. No abnormal fluid collections in the RIGHT lower quadrant. Ascending, transverse and descending colon normal. The bowel is predominantly collapsed. There is stool in the rectum. Vascular/Lymphatic: IVC filter in infrarenal IVC. Several small lymph nodes surround the upper aorta. No pelvic lymphadenopathy. Reproductive: Calcified leiomyoma uterus.  No adnexal abnormality Other: Moderate free fluid along the pericolic gutters. Musculoskeletal: No aggressive osseous lesion. Chronic severe arthropathy of the RIGHT hip joint with erosion of the RIGHT femoral head. LEFT hip prosthetic. No acute findings. IMPRESSION: 1. No clear complication following appendectomy. No fluid collection in the RIGHT lower quadrant. 2. Moderate volume of intraperitoneal free fluid similar to comparison exam. 3. Mild distension of gallbladder could related to fasting state. Several gallstones noted. Consider gallbladder ultrasound for further evaluation. 4. Moderate bilateral pleural effusions and passive atelectasis similar prior. 5. Retention of IV contrast within the kidneys from CT scan 2 days prior. Findings suggest nephro  toxicity/renal insufficiency. Electronically Signed   By: Suzy Bouchard M.D.   On: 09/20/2020 17:19   CT ABDOMEN PELVIS W CONTRAST  Result Date: 09/18/2020 CLINICAL DATA:  Abdominal pain, nausea, vomiting EXAM: CT ABDOMEN AND PELVIS WITH CONTRAST TECHNIQUE: Multidetector CT imaging of the abdomen and pelvis was performed using the standard protocol following bolus administration of intravenous contrast. CONTRAST:  60mL OMNIPAQUE IOHEXOL 300 MG/ML  SOLN COMPARISON:  08/20/2020 FINDINGS: Lower chest: Coronary calcifications. Increase in small bilateral pleural effusions. Stable bleb at the right lung base. Increase in interstitial opacities in the lung bases, and patchy consolidation/atelectasis posteriorly at the right lung base. Hepatobiliary: Mildly nodular hepatic contour without focal lesion. Several partially calcified stones in the dependent aspect of the lumen of the mildly distended gallbladder. No biliary ductal dilatation. Pancreas: Advanced parenchymal atrophy without focal lesion or ductal dilatation. Spleen: Normal in size without focal abnormality. Adrenals/Urinary Tract: Adrenal glands unremarkable. Focal parenchymal loss in the upper pole right kidney, stable. Left double-J ureteral stent in good position. Mild  fullness of the central renal collecting system and proximal ureterectasis without overt hydronephrosis. Urinary bladder is distended containing small amount of gas suggesting recent instrumentation. 2.7 cm exophytic probable cyst from the lower pole left kidney. Stomach/Bowel: The stomach is nondilated. The small bowel is decompressed. Appendix: Location: Retrocecal Diameter: 9 mm Appendicolith: None seen Mucosal hyper-enhancement: Indeterminate Extraluminal gas: None seen Periappendiceal collection: No loculated collection The colon is non distended. There is fecal dilatation of the rectum up to 7.9 cm. Vascular/Lymphatic: Aorta unremarkable. Infrarenal IVC filter with caval wall  penetration by filter legs involving the aorta and L3 vertebral body. Stable subcentimeter left para-aortic and aortocaval lymph nodes. For no pelvic adenopathy. Prominent supraceliac and gastrohepatic ligament lymph nodes measuring up to 0.9 cm short axis diameter. Reproductive: Stable coarse uterine calcifications. No adnexal mass. Other: New small volume abdominal ascites predominantly perihepatic, perisplenic, and left lower quadrant. No free air. Musculoskeletal: Ventral hernia containing only mesenteric fat. Stable 3.8 cm presumed injection granuloma posterior to the left SI joint. Stable L1 and L3 compression deformities. Chronic fracture-dislocation of the right hip resorption of much of the femoral head. Left hip arthroplasty components are partially visualized, with chronic protrusio and attempts at cement restoration. No acute fracture or worrisome bone lesion. IMPRESSION: 1. Dilated appendix with equivocal mucosal hyper-enhancement, may represent early appendicitis, without evidence of perforation or abscess. 2. New small volume abdominal ascites. 3. Increase in small bilateral pleural effusions. 4. Cholelithiasis. 5. Coronary calcifications. 6. Additional chronic findings as above. Aortic Atherosclerosis (ICD10-I70.0). Electronically Signed   By: Lucrezia Europe M.D.   On: 09/18/2020 08:17   Assessment/Plan:  Active Problems:   Appendicitis   S/P appendectomy  [name] is a 85 year old female with past medical history of IDDM, CKD3, hypoalbuminemia, chronic right hip fracture, recent admission for emphysematous cystitis and left-sided emphysematous pyelitis with ESBL klebsiella bacteremia who presented to [facility] on 11/08 with right lower quadrant abdominal pain found to have acute appendicitis s/p appendectomy on 11/08.  #Appendicitis s/p appendectomy Patient presented with right lower quadrant abdominal pain with evidence of early appendicitis on CT abdomen/pelvis. Patient underwent  appendectomy with general surgery on 40/98 without complication. The appendix was noted to be mildly inflamed and sent to pathology for further evaluation. Surgical pathology evaluation confirmed acute appendicitis. Following the operation, patient continued to report waxing and waning mild right-sided abdominal pain. -Acetaminophen 650mg  Q6H PRN -Oxycodone 5mg  Q4H PRN -Morphine 1-4mg  Q4H PRN for breakthrough pain -Zofran 4mg  Q6H PRN  #Klebsiella Pneumoniae UTI, active Patient with acute rise in white blood cell count from 8.0 to 18.8. Although a component may be reactive in the setting of her recent operation, her urinalysis is concerning for associated urinary tract infection and she has suprapubic tenderness on physical examination. Urine cultures showing >100,000 Klebsiella Pneumoniae. We will continue broad spectrum antibiotics at this time. -Continue meropenem -Daily CBC  #AoCKD 3a, chronic Patient's creatinine elevated from baseline of 1.3. CT Abdomen/Pelvis demonstrating retention of IV contrast from initial CT scan. AKI likely secondary to injury from contrast, urinary tract infection as well as intravascular volume depletion. -Continue meropenem -LR 75cc/hr -Monitor creatinine on daily CMP  #Anasarca, chronic #Hypoalbuminemia, chronic #Cirrhosis, chronic Patient has anasarca in the setting of her profound hypoalbuminemia (1.3). During patient's appendectomy, her liver was noticed to have a nodular texture concerning for cirrhosis. LFTs within normal limits, however alkaline phosphatase mildly elevated. INR elevated to 1.5. Patient with reactive hepatitis B core antibody and surface antibody suggesting prior hepatitis B  infection. Patient will require further workup of this condition in the outpatient setting with gastroenterology. -RUQ ultrasound -Follow-up with gastroenterology in outpatient setting  #Poor oral intake, active For several months, patient has had poor oral intake.  RD consulted and provided recommendations. SLP evaluated and recommended barium swallow. -LR 75cc/hr -Boost supplementation TID -Modified barium swallow -Carb modified diet now, will need to be adjusted pending further evaluation  #IDDM, chronic Patient prescribed 5u lantus nightly at home. CBGs well controlled on current regimen. -Continue sensitive SSI -CBGs AC and HS  #VTE ppx: Lovenox 30mg  daily #Diet: Carb modified, will need to be modified following swallow evaluation #Bowel regimen: Miralax daily PRN #Code status: Full code #PT/OT recs: No PT/OT follow up; supervision/assistance - 24 hour  Cato Mulligan, MD 09/21/2020, 6:55 AM Pager: [phone] After 5pm on weekdays and 1pm on weekends: On Call pager [phone]

## 2020-09-22 DIAGNOSIS — Z9089 Acquired absence of other organs: Secondary | ICD-10-CM

## 2020-09-22 DIAGNOSIS — R601 Generalized edema: Secondary | ICD-10-CM

## 2020-09-22 DIAGNOSIS — B961 Klebsiella pneumoniae [K. pneumoniae] as the cause of diseases classified elsewhere: Secondary | ICD-10-CM

## 2020-09-22 DIAGNOSIS — N12 Tubulo-interstitial nephritis, not specified as acute or chronic: Secondary | ICD-10-CM

## 2020-09-22 DIAGNOSIS — K7460 Unspecified cirrhosis of liver: Secondary | ICD-10-CM

## 2020-09-22 DIAGNOSIS — E8809 Other disorders of plasma-protein metabolism, not elsewhere classified: Secondary | ICD-10-CM

## 2020-09-22 LAB — CBC
HCT: 25.1 % — ABNORMAL LOW (ref 36.0–46.0)
Hemoglobin: 7.7 g/dL — ABNORMAL LOW (ref 12.0–15.0)
MCH: 29.8 pg (ref 26.0–34.0)
MCHC: 30.7 g/dL (ref 30.0–36.0)
MCV: 97.3 fL (ref 80.0–100.0)
Platelets: 112 10*3/uL — ABNORMAL LOW (ref 150–400)
RBC: 2.58 MIL/uL — ABNORMAL LOW (ref 3.87–5.11)
RDW: 14.9 % (ref 11.5–15.5)
WBC: 10.2 10*3/uL (ref 4.0–10.5)
nRBC: 0 % (ref 0.0–0.2)

## 2020-09-22 LAB — COMPREHENSIVE METABOLIC PANEL
ALT: 11 U/L (ref 0–44)
AST: 16 U/L (ref 15–41)
Albumin: 1.3 g/dL — ABNORMAL LOW (ref 3.5–5.0)
Alkaline Phosphatase: 114 U/L (ref 38–126)
Anion gap: 7 (ref 5–15)
BUN: 26 mg/dL — ABNORMAL HIGH (ref 8–23)
CO2: 24 mmol/L (ref 22–32)
Calcium: 7.2 mg/dL — ABNORMAL LOW (ref 8.9–10.3)
Chloride: 109 mmol/L (ref 98–111)
Creatinine, Ser: 2.12 mg/dL — ABNORMAL HIGH (ref 0.44–1.00)
GFR, Estimated: 23 mL/min — ABNORMAL LOW (ref >60)
Glucose, Bld: 218 mg/dL — ABNORMAL HIGH (ref 70–99)
Potassium: 3.9 mmol/L (ref 3.5–5.1)
Sodium: 140 mmol/L (ref 135–145)
Total Bilirubin: 0.8 mg/dL (ref 0.3–1.2)
Total Protein: 4.8 g/dL — ABNORMAL LOW (ref 6.5–8.1)

## 2020-09-22 LAB — GLUCOSE, CAPILLARY
Glucose-Capillary: 189 mg/dL — ABNORMAL HIGH (ref 70–99)
Glucose-Capillary: 147 mg/dL — ABNORMAL HIGH (ref 70–99)
Glucose-Capillary: 163 mg/dL — ABNORMAL HIGH (ref 70–99)

## 2020-09-22 LAB — URINE CULTURE: Culture: >=100000 — AB

## 2020-09-22 MED ORDER — RESOURCE THICKENUP CLEAR PO POWD
ORAL | Status: DC | PRN
  Filled 2020-09-22: qty 125

## 2020-09-22 MED ORDER — ALBUMIN HUMAN 25 % IV SOLN
25.0000 g | Freq: Once | INTRAVENOUS | Status: AC
  Administered 2020-09-22: 25 g via INTRAVENOUS
  Filled 2020-09-22: qty 100

## 2020-09-22 MED ORDER — STARCH (THICKENING) PO POWD
ORAL | Status: DC | PRN
  Filled 2020-09-22: qty 227

## 2020-09-22 MED ORDER — LACTATED RINGERS IV SOLN: INTRAVENOUS | Status: AC

## 2020-09-22 NOTE — Progress Notes (Signed)
Subjective:   [name] is a 85 year old female with past medical history of IDDM, CKD3, hypoalbuminemia, chronic right hip fracture, recent admission for emphysematous cystitis and left-sided emphysematous pyelitis with ESBL klebsiella bacteremia who presented to [facility] on 11/08 with right lower quadrant abdominal pain found to have acute appendicitis s/p appendectomy on 11/08.  Overnight, patient had development of asymptomatic atrial fibrillation with RVR.  This morning, patient repeatedly pointing to mouth to indicate that she is thirsty, however she turns down the thick liquids offered to her. She points to her umbilical/suprapubic region to indicate where she continues to have abdominal pain. Attempted to call daughter for translation x3 without response, and unable to use [employer] as patient's dialect not available through this service.  Objective:  Vital signs in last 24 hours: Vitals:   09/21/20 1600 09/21/20 1701 09/21/20 1957 09/22/20 0514  BP: (!) 121/53 (!) 123/59 113/62 (!) 98/58  Pulse: (!) 105 (!) 115 (!) 108 99  Resp: 20 (!) 25 18   Temp:  97.9 F (36.6 C) 98.4 F (36.9 C) 98.1 F (36.7 C)  TempSrc:   Axillary Axillary  SpO2: 95% 98% 98% 100%  Weight:      Height:      SpO2: 100 % O2 Flow Rate (L/min): 2 L/min Filed Weights   09/18/20 1158 09/21/20 0500  Weight: 90 kg 82.1 kg    Intake/Output Summary (Last 24 hours) at 09/22/2020 0927 Last data filed at 09/22/2020 0854 Gross per 24 hour  Intake 1923.43 ml  Output 425 ml  Net 1498.43 ml   Physical Exam Vitals and nursing note reviewed. Exam conducted with a chaperone present.  Constitutional:      General: She is not in acute distress.    Appearance: She is obese.  HENT:     Head: Normocephalic and atraumatic.  Cardiovascular:     Rate and Rhythm: Normal rate and regular rhythm.     Heart sounds: Normal heart sounds.  Pulmonary:     Effort: Pulmonary effort is normal. No respiratory  distress.     Breath sounds: Normal breath sounds.  Abdominal:     General: Abdomen is flat. Bowel sounds are normal. There is no distension.     Palpations: Abdomen is soft.     Tenderness: There is abdominal tenderness in the right upper quadrant, right lower quadrant and suprapubic area.     Comments: Surgical incisions clean, dry with no drainage  Musculoskeletal:     Right lower leg: 3+ Edema present.     Left lower leg: 3+ Edema present.  Skin:    General: Skin is warm and dry.     Capillary Refill: Capillary refill takes less than 2 seconds.  Neurological:     General: No focal deficit present.     Mental Status: She is alert and oriented to person, place, and time.  Psychiatric:        Mood and Affect: Mood normal.        Behavior: Behavior normal.    CBC Latest Ref Rng & Units 09/22/2020 09/21/2020 09/20/2020  WBC 4.0 - 10.5 K/uL 10.2 11.2(H) 19.3(H)  Hemoglobin 12.0 - 15.0 g/dL 7.7(L) 7.5(L) 8.9(L)  Hematocrit 36 - 46 % 25.1(L) 24.8(L) 30.2(L)  Platelets 150 - 400 K/uL 112(L) 112(L) 149(L)   CMP Latest Ref Rng & Units 09/22/2020 09/21/2020 09/20/2020  Glucose 70 - 99 mg/dL 218(H) 146(H) 164(H)  BUN 8 - 23 mg/dL 26(H) 22 22  Creatinine 0.44 -  1.00 mg/dL 2.12(H) 1.83(H) 1.71(H)  Sodium 135 - 145 mmol/L 140 141 140  Potassium 3.5 - 5.1 mmol/L 3.9 3.9 4.3  Chloride 98 - 111 mmol/L 109 111 110  CO2 22 - 32 mmol/L 24 23 23   Calcium 8.9 - 10.3 mg/dL 7.2(L) 7.4(L) 7.4(L)  Total Protein 6.5 - 8.1 g/dL 4.8(L) 4.9(L) 5.5(L)  Total Bilirubin 0.3 - 1.2 mg/dL 0.8 0.7 0.6  Alkaline Phos 38 - 126 U/L 114 101 117  AST 15 - 41 U/L 16 21 27   ALT 0 - 44 U/L 11 12 11    Hepatitis B core Antibody - reactive Hepatitis B surface antibody - reactive Hepatitis B surface antigen - non reactive Hepatitis C antibody - 0.2 (negative) INR - 1.5  Urine culture: ->100,000 ESBL producing klebsiella pneumoniae  -Sensitive to ciprofloxacin, imipenem, piperacillin/tazobactam  -Resistant to  ampicillin, cefazolin, cefepime, ceftriaxone, gentamicin, nitrofurantoin, trimethoprim/sulfamethoxazole, ampicillin/sulbactam  CT ABDOMEN PELVIS WO CONTRAST  Result Date: 09/20/2020 CLINICAL DATA:  RIGHT lower quadrant pain. Appendectomy September 18, 2020. EXAM: CT ABDOMEN AND PELVIS WITHOUT CONTRAST TECHNIQUE: Multidetector CT imaging of the abdomen and pelvis was performed following the standard protocol without IV contrast. COMPARISON:  None. FINDINGS: Lower chest: Bibasilar atelectasis and effusions. Findings similar comparison exam 2 days prior. Hepatobiliary: Mild gallbladder distension similar to prior. Single dependent 1 cm gallstone. Small fluid surrounds the margin liver similar to prior. Pancreas: Pancreas is normal. No ductal dilatation. No pancreatic inflammation. Spleen: Normal spleen Adrenals/urinary tract: Adrenal glands normal. There is retained contrast within the kidneys from scan 2 days prior suggesting renal insufficiency. There is a double-J ureteral stent on the LEFT. No hydronephrosis. Foley catheter within collapsed bladder. Stomach/Bowel: Stomach, small bowel cecum normal. Post appendectomy. No abnormal fluid collections in the RIGHT lower quadrant. Ascending, transverse and descending colon normal. The bowel is predominantly collapsed. There is stool in the rectum. Vascular/Lymphatic: IVC filter in infrarenal IVC. Several small lymph nodes surround the upper aorta. No pelvic lymphadenopathy. Reproductive: Calcified leiomyoma uterus.  No adnexal abnormality Other: Moderate free fluid along the pericolic gutters. Musculoskeletal: No aggressive osseous lesion. Chronic severe arthropathy of the RIGHT hip joint with erosion of the RIGHT femoral head. LEFT hip prosthetic. No acute findings. IMPRESSION: 1. No clear complication following appendectomy. No fluid collection in the RIGHT lower quadrant. 2. Moderate volume of intraperitoneal free fluid similar to comparison exam. 3. Mild  distension of gallbladder could related to fasting state. Several gallstones noted. Consider gallbladder ultrasound for further evaluation. 4. Moderate bilateral pleural effusions and passive atelectasis similar prior. 5. Retention of IV contrast within the kidneys from CT scan 2 days prior. Findings suggest nephro toxicity/renal insufficiency. Electronically Signed   By: Suzy Bouchard M.D.   On: 09/20/2020 17:19   DG Swallowing Func-Speech Pathology  Result Date: 09/21/2020 Objective Swallowing Evaluation: Type of Study: Bedside Swallow Evaluation  Patient Details Name: Whitney [name] MRN: [id number] Date of Birth: 04-10-1935 Today's Date: 09/21/2020 Time: SLP Start Time (ACUTE ONLY): 1330 -SLP Stop Time (ACUTE ONLY): 1350 SLP Time Calculation (min) (ACUTE ONLY): 20 min Past Medical History: Past Medical History: Diagnosis Date  CKD (chronic kidney disease)   Complication of anesthesia   PER FAMILY PATIENT HAD A SEIZURE WHEN " THEY PUT A ROD IN HER LEG "  Diabetes mellitus without complication ([place])  Past Surgical History: Past Surgical History: Procedure Laterality Date  APPENDECTOMY  09/18/2020  CYSTOSCOPY W/ URETERAL STENT PLACEMENT Left 08/18/2020  Procedure: CYSTOSCOPY WITH RETROGRADE PYELOGRAM/URETERAL STENT PLACEMENT;  Surgeon: Rexene Alberts  R, MD;  Location: [place];  Service: Urology;  Laterality: Left;  IR FLUORO GUIDE CV LINE RIGHT  08/23/2020  IR REMOVAL TUN CV CATH W/O FL  08/31/2020  IR US GUIDE VASC ACCESS RIGHT  08/23/2020  LAPAROSCOPIC APPENDECTOMY N/A 09/18/2020  Procedure: APPENDECTOMY LAPAROSCOPIC;  Surgeon: Coralie Keens, MD;  Location: [place];  Service: General;  Laterality: N/A;  TOTAL HIP ARTHROPLASTY Right 03/05/2020  Procedure: HIP GIRDLE STONE;  Surgeon: Leandrew Koyanagi, MD;  Location: [place];  Service: Orthopedics;  Laterality: Right; HPI: WhitneyDixon is a 85 yo F w/ PMH of IDDm, CKD3, Hypoalbuminemia, Chronic R hip fracture and recent admission for pyelonephritis with ESBL kleb  bacteremia presenting to [facility] with abdominal pain due to appendicitis. Seen by SLP in 10/21: recommended dys 1 diet with thin liquids 2/2 instances of coughing and multiple swallows. SLP s/o as pt appeared to be at baseline functioning. CT abdomen: Lungs: Bibasilar atelectasis and effusions.  Subjective: pt very uncomfortable in bed Assessment / Plan / Recommendation CHL IP CLINICAL IMPRESSIONS 09/21/2020 Clinical Impression Pt presents with pharyngeal dysphagia characterized by a pharyngeal delay which resulted in penetration (PAS 3,5) with thin liquids and penetration (PAS 2) with nectar thick liquids. Penetration of thin liquids inconsistently resulted in silent trace aspiration (PAS 8). Depth of penetration was improved with use of smaller bolus sizes but ultimate aspiration was still inconsistently demonstrated. It is noteworthy that the study was conducted under optimal conditions with the pt sitting fully upright. However, considering the pt and her daughter's reports of pt having p.o. intake in a reclined position, her risk of aspiration would be notably increased. It is anticipated that quantity and frequency of aspiration increases with pt in a reclined position. Mastication was prolonged with regular textures but this did not appear to significantly impact safety. Esophageal screening revealed retention of barium in the mid to lower thoracic esophagus; determination of the etiology of this is beyond the scope of this study; however, pt's daughter did report the pt having esophageal-related issues since birth. A regular texture diet with nectar thick liquids is recommended at this time. SLP will follow for dysphagia treatment.  SLP Visit Diagnosis Dysphagia, pharyngoesophageal phase (R13.14) Attention and concentration deficit following -- Frontal lobe and executive function deficit following -- Impact on safety and function Moderate aspiration risk;Mild aspiration risk   CHL IP TREATMENT RECOMMENDATION  09/21/2020 Treatment Recommendations Therapy as outlined in treatment plan below   Prognosis 09/21/2020 Prognosis for Safe Diet Advancement Good Barriers to Reach Goals Other (Comment);Time post onset Barriers/Prognosis Comment -- CHL IP DIET RECOMMENDATION 09/21/2020 SLP Diet Recommendations Regular solids;Nectar thick liquid Liquid Administration via Cup;Straw Medication Administration Whole meds with puree Compensations Minimize environmental distractions;Slow rate;Small sips/bites Postural Changes Seated upright at 90 degrees   CHL IP OTHER RECOMMENDATIONS 09/21/2020 Recommended Consults -- Oral Care Recommendations Oral care BID Other Recommendations --   CHL IP FOLLOW UP RECOMMENDATIONS 09/21/2020 Follow up Recommendations Home health SLP   CHL IP FREQUENCY AND DURATION 09/21/2020 Speech Therapy Frequency (ACUTE ONLY) min 2x/week Treatment Duration 2 weeks      CHL IP ORAL PHASE 09/21/2020 Oral Phase WFL Oral - Pudding Teaspoon -- Oral - Pudding Cup -- Oral - Honey Teaspoon -- Oral - Honey Cup -- Oral - Nectar Teaspoon -- Oral - Nectar Cup -- Oral - Nectar Straw -- Oral - Thin Teaspoon -- Oral - Thin Cup -- Oral - Thin Straw -- Oral - Puree -- Oral - Mech Soft -- Oral -  Regular -- Oral - Multi-Consistency -- Oral - Pill -- Oral Phase - Comment --  CHL IP PHARYNGEAL PHASE 09/21/2020 Pharyngeal Phase Impaired Pharyngeal- Pudding Teaspoon -- Pharyngeal -- Pharyngeal- Pudding Cup -- Pharyngeal -- Pharyngeal- Honey Teaspoon -- Pharyngeal -- Pharyngeal- Honey Cup -- Pharyngeal -- Pharyngeal- Nectar Teaspoon -- Pharyngeal -- Pharyngeal- Nectar Cup Delayed swallow initiation-vallecula;Penetration/Aspiration during swallow Pharyngeal Material enters airway, remains ABOVE vocal cords then ejected out Pharyngeal- Nectar Straw Delayed swallow initiation-vallecula;Penetration/Aspiration during swallow Pharyngeal Material enters airway, remains ABOVE vocal cords then ejected out Pharyngeal- Thin Teaspoon -- Pharyngeal --  Pharyngeal- Thin Cup Delayed swallow initiation-vallecula;Penetration/Aspiration during swallow Pharyngeal Material enters airway, remains ABOVE vocal cords and not ejected out;Material enters airway, passes BELOW cords without attempt by patient to eject out (silent aspiration) Pharyngeal- Thin Straw Delayed swallow initiation-vallecula;Penetration/Aspiration during swallow Pharyngeal Material enters airway, remains ABOVE vocal cords and not ejected out;Material enters airway, CONTACTS cords and not ejected out;Material enters airway, passes BELOW cords without attempt by patient to eject out (silent aspiration) Pharyngeal- Puree Delayed swallow initiation-vallecula Pharyngeal -- Pharyngeal- Mechanical Soft -- Pharyngeal -- Pharyngeal- Regular Delayed swallow initiation-vallecula Pharyngeal -- Pharyngeal- Multi-consistency Delayed swallow initiation-vallecula Pharyngeal -- Pharyngeal- Pill Delayed swallow initiation-vallecula;Penetration/Aspiration during swallow  Pharyngeal -- Pharyngeal Comment --  CHL IP CERVICAL ESOPHAGEAL PHASE 09/21/2020 Cervical Esophageal Phase Impaired Pudding Teaspoon -- Pudding Cup -- Honey Teaspoon -- Honey Cup -- Nectar Teaspoon -- Nectar Cup -- Nectar Straw -- Thin Teaspoon -- Thin Cup -- Thin Straw (No Data); See impressions Puree -- Mechanical Soft -- Regular -- Multi-consistency -- Pill -- Cervical Esophageal Comment -- Tobie Poet I. Hardin Negus, [place], [place] Office number [phone] Pager [phone] Horton Marshall 09/21/2020, 2:27 PM              US Abdomen Limited RUQ (LIVER/GB)  Result Date: 09/21/2020 CLINICAL DATA:  Right upper quadrant pain EXAM: ULTRASOUND ABDOMEN LIMITED RIGHT UPPER QUADRANT COMPARISON:  CT 1 day prior FINDINGS: Gallbladder: There is cholelithiasis without evidence for gallbladder wall thickening. There is gallbladder sludge. The sonographic Percell Miller sign is reported as positive. Common bile duct: Diameter: 3 mm Liver: The  liver is heterogeneous with a nodular contour. Portal vein is patent on color Doppler imaging with normal direction of blood flow towards the liver. Other: There is a right-sided pleural effusion. There is a small volume of ascites. IMPRESSION: 1. Findings are equivocal for acute calculus cholecystitis. While the sonographic Percell Miller sign is positive in the presence of gallstones, there is no gallbladder wall thickening. 2. Cirrhotic appearing liver. 3. Small volume ascites. 4. Incidentally noted right-sided pleural effusion. Electronically Signed   By: Constance Holster M.D.   On: 09/21/2020 19:49   Assessment/Plan:  Active Problems:   Appendicitis   S/P appendectomy  [name] is a 85 year old female with past medical history of IDDM, CKD3, hypoalbuminemia, chronic right hip fracture, recent admission for emphysematous cystitis and left-sided emphysematous pyelitis with ESBL klebsiella bacteremia who presented to [facility] on 11/08 with right lower quadrant abdominal pain found to have acute appendicitis s/p appendectomy on 11/08 with hospital course complicated by continued ESBL producing klebsiella pneumoniae urinary tract infection despite treatment with meropenem.  #ESBL producing Klebsiella Pneumoniae UTI, active Patient with leukocytosis to 19.3 on 09/20/20, although following appendectomy. Although a component was contributed to be reactive in the setting of her recent operation, her urinalysis was concerning for associated urinary tract infection with continued suprapubic tenderness on physical examination. Urine cultures showing >100,000 ESBL producing Klebsiella Pneumoniae  sensitive to imipenem, ciprofloxacin, and piperacillin/tazobactam. She was started on meropenem on 11/10 per pharmacy dosing. As patient has continued ESBL producing Klebsiella Pneumoniae since prior hospitalization where patient received two weeks of meropenem, we would appreciate ID support for further suggestions and  management. -Continue meropenem -Daily CBC -Appreciate ID recommendations  #AoCKD 3a, chronic Patient's creatinine continues to trend up from baseline of 1.3 to 2.12. CT Abdomen/Pelvis on 09/20/20 demonstrating retention of IV contrast from initial CT scan on 11/08 consistent with renal insufficiency/nephro toxicity. AKI likely secondary to injury from contrast, urinary tract infection as well as intravascular volume depletion. -Continue meropenem -LR 75cc/hr today -Albumin 25% 25g -Monitor creatinine on daily CMP  #Anasarca, chronic #Hypoalbuminemia, chronic #Cirrhosis, chronic Patient has anasarca in the setting of her profound hypoalbuminemia. During patient's appendectomy, her liver was noticed to have a nodular texture concerning for cirrhosis. LFTs within normal limits, however alkaline phosphatase mildly elevated. INR elevated to 1.5. Patient with reactive hepatitis B core antibody and surface antibody suggesting prior hepatitis B infection. Patient will require further workup of this condition in the outpatient setting with gastroenterology. -Follow-up with gastroenterology in outpatient setting  #Poor oral intake, active For several months, patient has had poor oral intake. RD consulted and provided recommendations. SLP evaluated, performed barium swallow and recommending nectar thick liquids. -LR 75cc/hr -Boost supplementation TID -Carb modified diet now, nectar thick liquids  #Appendicitis s/p appendectomy Patient presented with right lower quadrant abdominal pain with evidence of early appendicitis on CT abdomen/pelvis. Patient underwent appendectomy with general surgery on 06/26 without complication. The appendix was noted to be mildly inflamed and sent to pathology for further evaluation. Surgical pathology evaluation confirmed acute appendicitis. Following the operation, patient continued to report waxing and waning mild right-sided abdominal pain. -Acetaminophen 650mg  Q6H  PRN -Oxycodone 5mg  Q4H PRN -Morphine 1-4mg  Q4H PRN for breakthrough pain -Zofran 4mg  Q6H PRN  #IDDM, chronic Patient prescribed 5u lantus nightly at home. CBGs well controlled on current regimen. -Continue sensitive SSI -CBGs AC and HS  #VTE ppx: Lovenox 30mg  daily #Diet: Carb modified, nectar thick #Bowel regimen: Miralax daily PRN #Code status: Full code #PT/OT recs: No PT/OT follow up; supervision/assistance - 24 hour  Cato Mulligan, MD 09/22/2020, 9:27 AM Pager: [phone] After 5pm on weekdays and 1pm on weekends: On Call pager [phone]

## 2020-09-22 NOTE — Progress Notes (Addendum)
Pt compliant with medications and procedures. Language barrier when daughter is not bedside (daughter's number is taped to computer).  Pt speaks Arabic, but translator on iPad uses a dialect that pt cannot understand. Increased edema all over. Tender abdomen. Ran 100 mL albumin and antibiotic today. Pt resting comfortably.  Pt ate <25% of breakfast.  Daughter brought her some food mid-afternoon, and she ate some of that. Pt did not want any of dinner tray. Liquids are nectar thick.  EKG ordered midday. Telemetry showed varying rates and rhythms throughout the day.

## 2020-09-22 NOTE — Progress Notes (Signed)
Palliative Medicine RN Note: Consult order rec'd for Whitney [name] and symptom management/pain.   Our team saw [name] over the summer, and her family made it clear that [place] are to continue aggressive care, as they view it as a religious obligation.   I will report to the weekend team that her symptoms are not controlled, and will request she be prioritized.  Marjie Skiff Denaja Verhoeven, RN, BSN, [facility] Palliative Medicine Team 09/22/2020 3:54 PM Office [phone]

## 2020-09-22 NOTE — Progress Notes (Signed)
  Speech Language Pathology Treatment: Dysphagia  Patient Details Name: Whitney [name] MRN: [id number] DOB: 06-08-1935 Today's Date: 09/22/2020 Time: 0900-0909 SLP Time Calculation (min) (ACUTE ONLY): 9 min  Assessment / Plan / Recommendation Clinical Impression  Pt was in bed, eating breakfast upon therapist's arrival.  Observations during PO intake were limited as pt had eaten at least 50% of her meal tray before therapist arrived and was pushing tray away to indicate that she was finished.  SLP did observe pt eating ~50% of a banana and ~2 sips of nectar thick liquids via straw without overt s/s of aspiration.  Oral care was completed after meal with no significant residue found the oral cavity.  Nursing denies any reports of difficulty tolerating diet since MBS yesterday.  Pt did have an escalated MEWS score and subsequent rapid response consult yesterday evening due to afib rvr with elevated HR at 117.  Pt with tachypnea and abdominal breathing at the time per rapid response note but was asymptomatic during today's session on 3L 02 via nasal cannula.  Lung sounds documented by nursing as clear but diminished on last bedside assessment.   For now, recommend that pt remain on her currently prescribed diet with ongoing SLP follow up for monitoring of diet toleration and readiness to progress as appropriate.  Pt left in bed with bed alarm set and call bell within reach.  Continue per current plan of care.    HPI HPI: Ms.Bridge is a 85 yo F w/ PMH of IDDm, CKD3, Hypoalbuminemia, Chronic R hip fracture and recent admission for pyelonephritis with ESBL kleb bacteremia presenting to [facility] with abdominal pain due to appendicitis. Seen by SLP in 10/21: recommended dys 1 diet with thin liquids 2/2 instances of coughing and multiple swallows. SLP s/o as pt appeared to be at baseline functioning. CT abdomen: Lungs: Bibasilar atelectasis and effusions.       SLP Plan  Continue with current plan of care        Recommendations  Diet recommendations: Regular;Nectar-thick liquid Liquids provided via: Straw;Cup Medication Administration: Whole meds with puree Supervision: Intermittent supervision to cue for compensatory strategies Compensations: Minimize environmental distractions;Slow rate;Small sips/bites Postural Changes and/or Swallow Maneuvers: Upright 30-60 min after meal                Oral Care Recommendations: Oral care BID Follow up Recommendations: Home health SLP SLP Visit Diagnosis: Dysphagia, pharyngoesophageal phase (R13.14) Plan: Continue with current plan of care       GO                Omie Ferger, Selinda Orion 09/22/2020, 9:15 AM

## 2020-09-22 NOTE — Consult Note (Signed)
[facility]                                                                                        Patient Identification: Patient Name: Whitney [name] MRN: [id number] [place] Date: 09/18/2020  5:35 AM Today's Date: 09/22/2020 Reason for consult:   Active Problems:   Appendicitis   S/P appendectomy   Antibiotics: Meropenem Day 3   Assessment Recurrent Pyelonephritis ( ESBL Kleb pneumoniae), Has a left ureteral stent placed on 10/8 in the setting of  severe emphysematous pyelonephritis/cystitis last admission 2/2 ESBl Kleb pneumo and s/p 2 weeks IV ertapenem   Acute appendicitis s/p Lap appendectomy - path consistent with acute appendicitis  Liver Cirrhosis/Hypoalbuminemia/Anascarca  Positive Hep B core antibody and Hep B surface ab. Hep B surface ag negative-> Possible Prior Hepatitis B Infection   Recommendations  Continue Meropenem ( renal dosing) for now.  Patient had received adequate therapy for pyelonephritis last admission with persistence of infection. Possibly needs another course of antibiotics for Pyelonephritis  Will have urology evaluate while in the hospital. It seems she is due for stent exchange soon.  Monitor CBC and CMP while on IV abx Will get additional Hep B labs   Will follow. Dr Johnnye Sima ( ) is available this weekend with questions and concerns   Rest of the management as per the primary team. Please call us with questions or concerns. Thank you for the consult   Rosiland Oz, MD [facility]   To contact the attending provider between 8A-5P or the covering provider during after hours 5P-8A, please log into the web site [email or web address] and access using universal [place] password for that web site. If you do not have the password, please call the hospital  operator. __________________________________________________________________________________________________________ HPI and Hospital Course: 85 yo F w/ PMH of IDDm, CKD3, Hypoalbuminemia, RT hip arthroplasty, bedbound  and recent admission for Left sided severe emphyesematous pyelonephritis with ESBL kleb pneumo/bacteremia who presented to the ED with complaints of acute onset abdominal pain one day prior to admit associated with nausea and NBNB emesis and few episodes of loose stool. Patient was recent hospitalised 10/7-10/21 for ESBL Klebsiella bacteremia with severe left sided emphysematous pyelonephritis/cystitis  requiring left ureteral stent by urology. Patient was treated with 2 weeks course of IV ertapenem in the hospital and discharged.   CT abd/pelvis on admit showed findings s/o early appendicitis , small ascites and small bilateral pleural effusion. Patient underwent laparoscopic appendectomy by surgery with path consistent with acute appendicitis. [place] RUQ with findings equivocal for acute calculus cholecystitis. Sonographic Percell Miller sign is positive with no gallbladder wall thickening. Cirrhotic appearing liver. Small volume ascites.and  right-sided pleural effusion.  UA ( good sample) >50 RBC and >50 WBC and Urine cx growing ESBL Kleb pneumo  ROS: Limited due to language barrier.  Past Medical History:  Diagnosis Date  . CKD (chronic kidney disease)   . Complication of anesthesia    PER FAMILY PATIENT HAD A SEIZURE WHEN " THEY PUT A ROD IN HER LEG "  . Diabetes mellitus without complication ([place])  Past Surgical History:  Procedure Laterality Date  . APPENDECTOMY  09/18/2020  . CYSTOSCOPY W/ URETERAL STENT PLACEMENT Left 08/18/2020   Procedure: CYSTOSCOPY WITH RETROGRADE PYELOGRAM/URETERAL STENT PLACEMENT;  Surgeon: [name], MD;  Location: [facility];  Service: Urology;  Laterality: Left;  . IR FLUORO GUIDE CV LINE RIGHT  08/23/2020  . IR REMOVAL TUN CV CATH W/O FL  08/31/2020   . IR US GUIDE VASC ACCESS RIGHT  08/23/2020  . LAPAROSCOPIC APPENDECTOMY N/A 09/18/2020   Procedure: APPENDECTOMY LAPAROSCOPIC;  Surgeon: Coralie Keens, MD;  Location: [facility];  Service: General;  Laterality: N/A;  . TOTAL HIP ARTHROPLASTY Right 03/05/2020   Procedure: HIP GIRDLE STONE;  Surgeon: Leandrew Koyanagi, MD;  Location: [facility];  Service: Orthopedics;  Laterality: Right;     Scheduled Meds: . Chlorhexidine Gluconate Cloth  6 each Topical Daily  . enoxaparin (LOVENOX) injection  30 mg Subcutaneous Q24H  . feeding supplement  1 Container Oral TID BM  . insulin aspart  0-9 Units Subcutaneous TID AC & HS  . multivitamin with minerals  1 tablet Oral Daily   Continuous Infusions: . albumin human    . lactated ringers    . lactated ringers 75 mL/hr at 09/22/20 1256  . meropenem (MERREM) IV 1 g (09/22/20 1247)   PRN Meds:.acetaminophen, food thickener, morphine injection, ondansetron **OR** ondansetron (ZOFRAN) IV, oxyCODONE, polyethylene glycol  Allergies  Allergen Reactions  . Succinylcholine Other (See Comments)    No formal diagnosis of pseudocholinesterase deficiency; however, prolonged paralysis during anesthetics on 09/18/20 and 08/18/20.    Social History   Socioeconomic History  . Marital status: Widowed    Spouse name: Not on file  . Number of children: Not on file  . Years of education: Not on file  . Highest education level: Not on file  Occupational History  . Not on file  Tobacco Use  . Smoking status: Never Smoker  . Smokeless tobacco: Never Used  Vaping Use  . Vaping Use: Never used  Substance and Sexual Activity  . Alcohol use: Never  . Drug use: Never  . Sexual activity: Not on file  Other Topics Concern  . Not on file  Social History Narrative  . Not on file   Social Determinants of Health   Financial Resource Strain:   . Difficulty of Paying Living Expenses: Not on file  Food Insecurity:   . Worried About Charity fundraiser in the Last Year:  Not on file  . Ran Out of Food in the Last Year: Not on file  Transportation Needs:   . Lack of Transportation (Medical): Not on file  . Lack of Transportation (Non-Medical): Not on file  Physical Activity:   . Days of Exercise per Week: Not on file  . Minutes of Exercise per Session: Not on file  Stress:   . Feeling of Stress : Not on file  Social Connections:   . Frequency of Communication with Friends and Family: Not on file  . Frequency of Social Gatherings with Friends and Family: Not on file  . Attends Religious Services: Not on file  . Active Member of Clubs or Organizations: Not on file  . Attends Archivist Meetings: Not on file  . Marital Status: Not on file  Intimate Partner Violence:   . Fear of Current or Ex-Partner: Not on file  . Emotionally Abused: Not on file  . Physically Abused: Not on file  . Sexually Abused: Not on file  Vitals  BP (!) 98/58 (BP Location: Left Arm)   Pulse 99   Temp 98.1 F (36.7 C) (Axillary)   Resp 18   Ht 5\' 3"  (1.6 m)   Wt 82.1 kg   SpO2 100%   BMI 32.06 kg/m    Physical Exam Constitutional:  Elderly female lying in bed, Not in acute distress     Comments:   Cardiovascular:     Rate and Rhythm: Normal rate and regular rhythm.     Heart sounds:  Pulmonary:     Effort: Pulmonary effort is normal.     Comments: DECREASED AIR ENTRY IN THE BILATERAL BASES   Abdominal:     Palpations: Abdomen is soft. HYPOACTIVE BOWEL SOUNDS     Tenderness: TENDERNESS IN THE SUPRAPUBIC REGION/RUQ AND RLQ  Musculoskeletal:        General: GENERALISED SWELLING/ANASARCA, RT THIGH IS INDRATED BUT NON TENDER   Skin:    Comments: No obvious rashes, lesions  Neurological:     General: grossly non focal   Psychiatric:        Mood and Affect: Mood normal.   Back: COULD NOT EXAMINED, SHE REFUSED TO POSITION DUE TO PAIN/DISCOMFORT   LINES/TUBES: PIV  METAL IMPLANT/HARDWARE:  Pertinent Microbiology Results for orders placed  or performed during the hospital encounter of 09/18/20  Respiratory Panel by RT PCR (Flu A&B, Covid) - Nasopharyngeal Swab     Status: None   Collection Time: 09/18/20 11:19 AM   Specimen: Nasopharyngeal Swab  Result Value Ref Range Status   SARS Coronavirus 2 by RT PCR NEGATIVE NEGATIVE Final    Comment: (NOTE) SARS-CoV-2 target nucleic acids are NOT DETECTED.  The SARS-CoV-2 RNA is generally detectable in upper respiratoy specimens during the acute phase of infection. The lowest concentration of SARS-CoV-2 viral copies this assay can detect is 131 copies/mL. A negative result does not preclude SARS-Cov-2 infection and should not be used as the sole basis for treatment or other patient management decisions. A negative result may occur with  improper specimen collection/handling, submission of specimen other than nasopharyngeal swab, presence of viral mutation(s) within the areas targeted by this assay, and inadequate number of viral copies (<131 copies/mL). A negative result must be combined with clinical observations, patient history, and epidemiological information. The expected result is Negative.  Fact Sheet for Patients:  PinkCheek.be  Fact Sheet for Healthcare Providers:  GravelBags.it  This test is no t yet approved or cleared by the [place] FDA and  has been authorized for detection and/or diagnosis of SARS-CoV-2 by FDA under an Emergency Use Authorization (EUA). This EUA will remain  in effect (meaning this test can be used) for the duration of the COVID-19 declaration under Section 564(b)(1) of the Act, 21 U.S.C. section 360bbb-3(b)(1), unless the authorization is terminated or revoked sooner.     Influenza A by PCR NEGATIVE NEGATIVE Final   Influenza B by PCR NEGATIVE NEGATIVE Final    Comment: (NOTE) The Xpert Xpress SARS-CoV-2/FLU/RSV assay is intended as an aid in  the diagnosis of influenza from  Nasopharyngeal swab specimens and  should not be used as a sole basis for treatment. Nasal washings and  aspirates are unacceptable for Xpert Xpress SARS-CoV-2/FLU/RSV  testing.  Fact Sheet for Patients: PinkCheek.be  Fact Sheet for Healthcare Providers: GravelBags.it  This test is not yet approved or cleared by the [place] FDA and  has been authorized for detection and/or diagnosis of SARS-CoV-2 by  FDA under an Emergency Use  Authorization (EUA). This EUA will remain  in effect (meaning this test can be used) for the duration of the  Covid-19 declaration under Section 564(b)(1) of the Act, 21  U.S.C. section 360bbb-3(b)(1), unless the authorization is  terminated or revoked. Performed at [facility], [place] [address] [place], [place] [address]   Culture, Urine     Status: Abnormal   Collection Time: 09/20/20  6:23 AM   Specimen: Urine, Catheterized  Result Value Ref Range Status   Specimen Description URINE, CATHETERIZED  Final   Special Requests   Final    NONE Performed at [facility], [place] [address], [place], [place] [address]    Culture (A)  Final    >=100,000 COLONIES/mL KLEBSIELLA PNEUMONIAE Confirmed Extended Spectrum Beta-Lactamase Producer (ESBL).  In bloodstream infections from ESBL organisms, carbapenems are preferred over piperacillin/tazobactam. They are shown to have a lower risk of mortality.    Report Status 09/22/2020 FINAL  Final   Organism ID, Bacteria KLEBSIELLA PNEUMONIAE (A)  Final      Susceptibility   Klebsiella pneumoniae - MIC*    AMPICILLIN >=32 RESISTANT Resistant     CEFAZOLIN >=64 RESISTANT Resistant     CEFEPIME >=32 RESISTANT Resistant     CEFTRIAXONE >=64 RESISTANT Resistant     CIPROFLOXACIN <=0.25 SENSITIVE Sensitive     GENTAMICIN >=16 RESISTANT Resistant     IMIPENEM <=0.25 SENSITIVE Sensitive     NITROFURANTOIN 128 RESISTANT Resistant     TRIMETH/SULFA >=320  RESISTANT Resistant     AMPICILLIN/SULBACTAM >=32 RESISTANT Resistant     PIP/TAZO 16 SENSITIVE Sensitive     * >=100,000 COLONIES/mL KLEBSIELLA PNEUMONIAE    Pertinent Lab seen by me: CBC Latest Ref Rng & Units 09/22/2020 09/21/2020 09/20/2020  WBC 4.0 - 10.5 K/uL 10.2 11.2(H) 19.3(H)  Hemoglobin 12.0 - 15.0 g/dL 7.7(L) 7.5(L) 8.9(L)  Hematocrit 36 - 46 % 25.1(L) 24.8(L) 30.2(L)  Platelets 150 - 400 K/uL 112(L) 112(L) 149(L)   CMP Latest Ref Rng & Units 09/22/2020 09/21/2020 09/20/2020  Glucose 70 - 99 mg/dL 218(H) 146(H) 164(H)  BUN 8 - 23 mg/dL 26(H) 22 22  Creatinine 0.44 - 1.00 mg/dL 2.12(H) 1.83(H) 1.71(H)  Sodium 135 - 145 mmol/L 140 141 140  Potassium 3.5 - 5.1 mmol/L 3.9 3.9 4.3  Chloride 98 - 111 mmol/L 109 111 110  CO2 22 - 32 mmol/L 24 23 23   Calcium 8.9 - 10.3 mg/dL 7.2(L) 7.4(L) 7.4(L)  Total Protein 6.5 - 8.1 g/dL 4.8(L) 4.9(L) 5.5(L)  Total Bilirubin 0.3 - 1.2 mg/dL 0.8 0.7 0.6  Alkaline Phos 38 - 126 U/L 114 101 117  AST 15 - 41 U/L 16 21 27   ALT 0 - 44 U/L 11 12 11     Pertinent Imagings/Other Imagings Plain films and CT images have been personally visualized and interpreted; radiology reports have been reviewed. Decision making incorporated into the Impression / Recommendations.  [place] RUQ abdomen 09/21/20  FINDINGS: Gallbladder:  There is cholelithiasis without evidence for gallbladder wall thickening. There is gallbladder sludge. The sonographic Percell Miller sign is reported as positive.  Common bile duct:  Diameter: 3 mm  Liver:  The liver is heterogeneous with a nodular contour. Portal vein is patent on color Doppler imaging with normal direction of blood flow towards the liver.  Other: There is a right-sided pleural effusion. There is a small volume of ascites.  IMPRESSION: 1. Findings are equivocal for acute calculus cholecystitis. While the sonographic Percell Miller sign is positive in the  presence of gallstones, there is no gallbladder wall  thickening. 2. Cirrhotic appearing liver. 3. Small volume ascites. 4. Incidentally noted right-sided pleural effusion.  CT abdomen/pelvis 09/20/20  FINDINGS: Lower chest: Bibasilar atelectasis and effusions. Findings similar comparison exam 2 days prior.  Hepatobiliary: Mild gallbladder distension similar to prior. Single dependent 1 cm gallstone. Small fluid surrounds the margin liver similar to prior.  Pancreas: Pancreas is normal. No ductal dilatation. No pancreatic inflammation.  Spleen: Normal spleen  Adrenals/urinary tract: Adrenal glands normal. There is retained contrast within the kidneys from scan 2 days prior suggesting renal insufficiency. There is a double-J ureteral stent on the LEFT. No hydronephrosis. Foley catheter within collapsed bladder.  Stomach/Bowel: Stomach, small bowel cecum normal. Post appendectomy. No abnormal fluid collections in the RIGHT lower quadrant. Ascending, transverse and descending colon normal. The bowel is predominantly collapsed. There is stool in the rectum.  Vascular/Lymphatic: IVC filter in infrarenal IVC. Several small lymph nodes surround the upper aorta. No pelvic lymphadenopathy.  Reproductive: Calcified leiomyoma uterus.  No adnexal abnormality  Other: Moderate free fluid along the pericolic gutters.  Musculoskeletal: No aggressive osseous lesion. Chronic severe arthropathy of the RIGHT hip joint with erosion of the RIGHT femoral head. LEFT hip prosthetic. No acute findings.  IMPRESSION: 1. No clear complication following appendectomy. No fluid collection in the RIGHT lower quadrant. 2. Moderate volume of intraperitoneal free fluid similar to comparison exam. 3. Mild distension of gallbladder could related to fasting state. Several gallstones noted. Consider gallbladder ultrasound for further evaluation. 4. Moderate bilateral pleural effusions and passive atelectasis similar prior. 5. Retention of IV  contrast within the kidneys from CT scan 2 days prior. Findings suggest nephro toxicity/renal insufficiency.   I have spent greater than 60 minutes for this patient encounter including review of prior medical records with greater than 50% of time being face to face and coordination of their care.

## 2020-09-22 NOTE — Plan of Care (Signed)
?  Problem: Clinical Measurements: ?Goal: Ability to maintain clinical measurements within normal limits will improve ?Outcome: Progressing ?Goal: Will remain free from infection ?Outcome: Progressing ?Goal: Diagnostic test results will improve ?Outcome: Progressing ?  ?

## 2020-09-23 DIAGNOSIS — Z515 Encounter for palliative care: Secondary | ICD-10-CM | POA: Diagnosis not present

## 2020-09-23 DIAGNOSIS — N3001 Acute cystitis with hematuria: Secondary | ICD-10-CM

## 2020-09-23 DIAGNOSIS — A498 Other bacterial infections of unspecified site: Secondary | ICD-10-CM | POA: Diagnosis not present

## 2020-09-23 DIAGNOSIS — E43 Unspecified severe protein-calorie malnutrition: Secondary | ICD-10-CM

## 2020-09-23 DIAGNOSIS — Z7189 Other specified counseling: Secondary | ICD-10-CM | POA: Diagnosis not present

## 2020-09-23 DIAGNOSIS — Z1612 Extended spectrum beta lactamase (ESBL) resistance: Secondary | ICD-10-CM

## 2020-09-23 DIAGNOSIS — K353 Acute appendicitis with localized peritonitis, without perforation or gangrene: Secondary | ICD-10-CM | POA: Diagnosis not present

## 2020-09-23 LAB — GLUCOSE, CAPILLARY
Glucose-Capillary: 143 mg/dL — ABNORMAL HIGH (ref 70–99)
Glucose-Capillary: 155 mg/dL — ABNORMAL HIGH (ref 70–99)
Glucose-Capillary: 182 mg/dL — ABNORMAL HIGH (ref 70–99)
Glucose-Capillary: 190 mg/dL — ABNORMAL HIGH (ref 70–99)
Glucose-Capillary: 211 mg/dL — ABNORMAL HIGH (ref 70–99)

## 2020-09-23 LAB — BASIC METABOLIC PANEL
Anion gap: 6 (ref 5–15)
BUN: 26 mg/dL — ABNORMAL HIGH (ref 8–23)
CO2: 25 mmol/L (ref 22–32)
Calcium: 7.5 mg/dL — ABNORMAL LOW (ref 8.9–10.3)
Chloride: 111 mmol/L (ref 98–111)
Creatinine, Ser: 1.96 mg/dL — ABNORMAL HIGH (ref 0.44–1.00)
GFR, Estimated: 25 mL/min — ABNORMAL LOW (ref >60)
Glucose, Bld: 175 mg/dL — ABNORMAL HIGH (ref 70–99)
Potassium: 4.1 mmol/L (ref 3.5–5.1)
Sodium: 142 mmol/L (ref 135–145)

## 2020-09-23 LAB — CBC
HCT: 24.4 % — ABNORMAL LOW (ref 36.0–46.0)
Hemoglobin: 7.5 g/dL — ABNORMAL LOW (ref 12.0–15.0)
MCH: 29.5 pg (ref 26.0–34.0)
MCHC: 30.7 g/dL (ref 30.0–36.0)
MCV: 96.1 fL (ref 80.0–100.0)
Platelets: 107 10*3/uL — ABNORMAL LOW (ref 150–400)
RBC: 2.54 MIL/uL — ABNORMAL LOW (ref 3.87–5.11)
RDW: 14.8 % (ref 11.5–15.5)
WBC: 7.6 10*3/uL (ref 4.0–10.5)
nRBC: 0 % (ref 0.0–0.2)

## 2020-09-23 LAB — HEPATITIS B CORE ANTIBODY, IGM: Hep B C IgM: NONREACTIVE

## 2020-09-23 MED ORDER — TAMSULOSIN HCL 0.4 MG PO CAPS
0.4000 mg | ORAL_CAPSULE | Freq: Every day | ORAL | Status: DC
  Administered 2020-09-23 – 2020-10-03 (×8): 0.4 mg via ORAL
  Filled 2020-09-23 (×10): qty 1

## 2020-09-23 MED ORDER — NYSTATIN 100000 UNIT/ML MT SUSP
5.0000 mL | Freq: Four times a day (QID) | OROMUCOSAL | Status: DC
  Administered 2020-09-23 – 2020-10-03 (×21): 500000 [IU] via ORAL
  Filled 2020-09-23 (×32): qty 5

## 2020-09-23 MED ORDER — LACTATED RINGERS IV BOLUS
500.0000 mL | Freq: Once | INTRAVENOUS | Status: AC
  Administered 2020-09-23: 500 mL via INTRAVENOUS

## 2020-09-23 MED ORDER — VITAMIN D (ERGOCALCIFEROL) 1.25 MG (50000 UNIT) PO CAPS
50000.0000 [IU] | ORAL_CAPSULE | ORAL | Status: DC
  Administered 2020-09-23 – 2020-09-30 (×2): 50000 [IU] via ORAL
  Filled 2020-09-23 (×2): qty 1

## 2020-09-23 MED ORDER — ACETAMINOPHEN 325 MG PO TABS
650.0000 mg | ORAL_TABLET | Freq: Three times a day (TID) | ORAL | Status: DC
  Administered 2020-09-23 – 2020-10-03 (×19): 650 mg via ORAL
  Filled 2020-09-23 (×27): qty 2

## 2020-09-23 NOTE — Consult Note (Signed)
Urology Consult   Physician requesting consult: Aldine Contes, MD  Reason for consult: UTI, history of left stent  History of Present Illness: Whitney [name] is a 85 y.o. who has a history of emphysematous cystitis and emphysematous left pyelitis seen on CT A/P 08/17/2020. She underwent cystoscopy, left retrograde pyelogram and left stent placement on 08/18/2020. Follow-up CT A/P 08/20/2020 revealed decompressed left renal collecting system with no gas in the collecting system and bladder. She was treated with a prolonged course of antibiotics and ultimately discharged home. She represented with ESBL Klebsiella UTI. She has been placed on meropenem. CT A/P 09/20/2020 revealed left ureteral stent placement in appropriate position with no hydronephrosis and collapsed bladder around a Foley catheter. Of note history is limited from the patient however her pain appears to be controlled.  Past Medical History:  Diagnosis Date  . CKD (chronic kidney disease)   . Complication of anesthesia    PER FAMILY PATIENT HAD A SEIZURE WHEN " THEY PUT A ROD IN HER LEG "  . Diabetes mellitus without complication [facility])     Past Surgical History:  Procedure Laterality Date  . APPENDECTOMY  09/18/2020  . CYSTOSCOPY W/ URETERAL STENT PLACEMENT Left 08/18/2020   Procedure: CYSTOSCOPY WITH RETROGRADE PYELOGRAM/URETERAL STENT PLACEMENT;  Surgeon: [name], MD;  Location: [place];  Service: Urology;  Laterality: Left;  . IR FLUORO GUIDE CV LINE RIGHT  08/23/2020  . IR REMOVAL TUN CV CATH W/O FL  08/31/2020  . IR US GUIDE VASC ACCESS RIGHT  08/23/2020  . LAPAROSCOPIC APPENDECTOMY N/A 09/18/2020   Procedure: APPENDECTOMY LAPAROSCOPIC;  Surgeon: Coralie Keens, MD;  Location: [place];  Service: General;  Laterality: N/A;  . TOTAL HIP ARTHROPLASTY Right 03/05/2020   Procedure: HIP GIRDLE STONE;  Surgeon: Leandrew Koyanagi, MD;  Location: [place];  Service: Orthopedics;  Laterality: Right;     Current Hospital  Medications:  Home meds:  No current facility-administered medications on file prior to encounter.   Current Outpatient Medications on File Prior to Encounter  Medication Sig Dispense Refill  . acetaminophen (TYLENOL) 500 MG tablet Take 2 tablets by mouth every 8 (eight) hours as needed for moderate pain. (Patient not taking: Reported on 05/27/2020)    . calcitonin, salmon, (MIACALCIN/FORTICAL) 200 UNIT/ACT nasal spray Place 1 spray into alternate nostrils daily. (Patient not taking: Reported on 09/18/2020) 3.7 mL 0  . famotidine (PEPCID) 20 MG tablet Take 1 tablet (20 mg total) by mouth 2 (two) times daily. (Patient not taking: Reported on 05/08/2020) 60 tablet 0  . ibuprofen (ADVIL) 200 MG tablet Take 200-400 mg by mouth daily as needed for headache (pain). (Patient not taking: Reported on 09/18/2020)    . insulin glargine (LANTUS) 100 UNIT/ML Solostar Pen Inject 5 Units into the skin at bedtime. (Patient not taking: Reported on 09/18/2020) 3 mL 5  . Insulin Pen Needle (PEN NEEDLES) 30G X 5 MM MISC Use as instructed. 100 each 12  . Multiple Vitamin (MULTIVITAMIN WITH MINERALS) TABS tablet Take 1 tablet by mouth daily. (Patient not taking: Reported on 09/18/2020)    . oxyCODONE-acetaminophen (PERCOCET) 5-325 MG tablet Take 1-2 tablets by mouth every 8 (eight) hours as needed for severe pain. (Patient not taking: Reported on 04/12/2020) 30 tablet 0  . tamsulosin (FLOMAX) 0.4 MG CAPS capsule Take 1 capsule (0.4 mg total) by mouth daily. (Patient not taking: Reported on 09/18/2020) 30 capsule 1  . Vitamin D, Ergocalciferol, (DRISDOL) 1.25 MG (50000 UNIT) CAPS capsule Take 1  capsule (50,000 Units total) by mouth every 7 (seven) days. (Patient not taking: Reported on 09/18/2020) 8 capsule 0     Scheduled Meds: . Chlorhexidine Gluconate Cloth  6 each Topical Daily  . enoxaparin (LOVENOX) injection  30 mg Subcutaneous Q24H  . feeding supplement  1 Container Oral TID BM  . insulin aspart  0-9 Units  Subcutaneous TID AC & HS  . multivitamin with minerals  1 tablet Oral Daily   Continuous Infusions: . lactated ringers    . lactated ringers 75 mL/hr at 09/22/20 1256  . meropenem (MERREM) IV 1 g (09/22/20 2336)   PRN Meds:.acetaminophen, morphine injection, ondansetron **OR** ondansetron (ZOFRAN) IV, oxyCODONE, polyethylene glycol, Resource ThickenUp Clear  Allergies:  Allergies  Allergen Reactions  . Succinylcholine Other (See Comments)    No formal diagnosis of pseudocholinesterase deficiency; however, prolonged paralysis during anesthetics on 09/18/20 and 08/18/20.     History reviewed. No pertinent family history.  Social History:  reports that she has never smoked. She has never used smokeless tobacco. She reports that she does not drink alcohol and does not use drugs.  ROS: A complete review of systems was performed.  All systems are negative except for pertinent findings as noted.  Physical Exam:  Vital signs in last 24 hours: Temp:  [98.1 F (36.7 C)-99 F (37.2 C)] 99 F (37.2 C) (11/12 1900) Pulse Rate:  [86-99] 90 (11/12 1900) Resp:  [19] 19 (11/12 1900) BP: (98-117)/(44-58) 117/47 (11/12 1900) SpO2:  [100 %] 100 % (11/12 1900) Constitutional:  Alert and oriented, No acute distress Cardiovascular: Regular rate and rhythm Respiratory: Normal respiratory effort, Lungs clear bilaterally GI: Abdomen is soft, nontender, nondistended, no abdominal masses GU: No CVA tenderness Neurologic: Grossly intact, no focal deficits Psychiatric: Normal mood and affect  Laboratory Data:  Recent Labs    09/20/20 0240 09/20/20 1114 09/21/20 0053 09/22/20 0102  WBC 17.1* 19.3* 11.2* 10.2  HGB 8.9* 8.9* 7.5* 7.7*  HCT 29.9* 30.2* 24.8* 25.1*  PLT 127* 149* 112* 112*    Recent Labs    09/20/20 0240 09/20/20 1114 09/21/20 0053 09/22/20 0102  NA 141 140 141 140  K 4.1 4.3 3.9 3.9  CL 110 110 111 109  GLUCOSE 148* 164* 146* 218*  BUN 19 22 22  26*  CALCIUM 7.5* 7.4*  7.4* 7.2*  CREATININE 1.62* 1.71* 1.83* 2.12*     Results for orders placed or performed during the hospital encounter of 09/18/20 (from the past 24 hour(s))  Glucose, capillary     Status: Abnormal   Collection Time: 09/22/20  7:38 AM  Result Value Ref Range   Glucose-Capillary 189 (H) 70 - 99 mg/dL  Glucose, capillary     Status: Abnormal   Collection Time: 09/22/20 12:01 PM  Result Value Ref Range   Glucose-Capillary 147 (H) 70 - 99 mg/dL  Glucose, capillary     Status: Abnormal   Collection Time: 09/22/20  8:19 PM  Result Value Ref Range   Glucose-Capillary 163 (H) 70 - 99 mg/dL   Recent Results (from the past 240 hour(s))  Respiratory Panel by RT PCR (Flu A&B, Covid) - Nasopharyngeal Swab     Status: None   Collection Time: 09/18/20 11:19 AM   Specimen: Nasopharyngeal Swab  Result Value Ref Range Status   SARS Coronavirus 2 by RT PCR NEGATIVE NEGATIVE Final    Comment: (NOTE) SARS-CoV-2 target nucleic acids are NOT DETECTED.  The SARS-CoV-2 RNA is generally detectable in upper respiratoy specimens during the  acute phase of infection. The lowest concentration of SARS-CoV-2 viral copies this assay can detect is 131 copies/mL. A negative result does not preclude SARS-Cov-2 infection and should not be used as the sole basis for treatment or other patient management decisions. A negative result may occur with  improper specimen collection/handling, submission of specimen other than nasopharyngeal swab, presence of viral mutation(s) within the areas targeted by this assay, and inadequate number of viral copies (<131 copies/mL). A negative result must be combined with clinical observations, patient history, and epidemiological information. The expected result is Negative.  Fact Sheet for Patients:  PinkCheek.be  Fact Sheet for Healthcare Providers:  GravelBags.it  This test is no t yet approved or cleared by the  [place] FDA and  has been authorized for detection and/or diagnosis of SARS-CoV-2 by FDA under an Emergency Use Authorization (EUA). This EUA will remain  in effect (meaning this test can be used) for the duration of the COVID-19 declaration under Section 564(b)(1) of the Act, 21 U.S.C. section 360bbb-3(b)(1), unless the authorization is terminated or revoked sooner.     Influenza A by PCR NEGATIVE NEGATIVE Final   Influenza B by PCR NEGATIVE NEGATIVE Final    Comment: (NOTE) The Xpert Xpress SARS-CoV-2/FLU/RSV assay is intended as an aid in  the diagnosis of influenza from Nasopharyngeal swab specimens and  should not be used as a sole basis for treatment. Nasal washings and  aspirates are unacceptable for Xpert Xpress SARS-CoV-2/FLU/RSV  testing.  Fact Sheet for Patients: PinkCheek.be  Fact Sheet for Healthcare Providers: GravelBags.it  This test is not yet approved or cleared by the [place] FDA and  has been authorized for detection and/or diagnosis of SARS-CoV-2 by  FDA under an Emergency Use Authorization (EUA). This EUA will remain  in effect (meaning this test can be used) for the duration of the  Covid-19 declaration under Section 564(b)(1) of the Act, 21  U.S.C. section 360bbb-3(b)(1), unless the authorization is  terminated or revoked. Performed at [facility], [place] [address], [place], [place] [address]   Culture, Urine     Status: Abnormal   Collection Time: 09/20/20  6:23 AM   Specimen: Urine, Catheterized  Result Value Ref Range Status   Specimen Description URINE, CATHETERIZED  Final   Special Requests   Final    NONE Performed at [facility], [place] [address], [place], [place] [address]    Culture (A)  Final    >=100,000 COLONIES/mL KLEBSIELLA PNEUMONIAE Confirmed Extended Spectrum Beta-Lactamase Producer (ESBL).  In bloodstream infections from ESBL organisms, carbapenems are  preferred over piperacillin/tazobactam. They are shown to have a lower risk of mortality.    Report Status 09/22/2020 FINAL  Final   Organism ID, Bacteria KLEBSIELLA PNEUMONIAE (A)  Final      Susceptibility   Klebsiella pneumoniae - MIC*    AMPICILLIN >=32 RESISTANT Resistant     CEFAZOLIN >=64 RESISTANT Resistant     CEFEPIME >=32 RESISTANT Resistant     CEFTRIAXONE >=64 RESISTANT Resistant     CIPROFLOXACIN <=0.25 SENSITIVE Sensitive     GENTAMICIN >=16 RESISTANT Resistant     IMIPENEM <=0.25 SENSITIVE Sensitive     NITROFURANTOIN 128 RESISTANT Resistant     TRIMETH/SULFA >=320 RESISTANT Resistant     AMPICILLIN/SULBACTAM >=32 RESISTANT Resistant     PIP/TAZO 16 SENSITIVE Sensitive     * >=100,000 COLONIES/mL KLEBSIELLA PNEUMONIAE    Renal Function: Recent Labs    09/18/20 0800 09/19/20 0502  09/20/20 0240 09/20/20 1114 09/21/20 0053 09/22/20 0102  CREATININE 1.41* 1.31* 1.62* 1.71* 1.83* 2.12*   Estimated Creatinine Clearance: 20.4 mL/min (A) (by C-G formula based on SCr of 2.12 mg/dL (H)).  Radiologic Imaging: DG Swallowing Func-Speech Pathology  Result Date: 09/21/2020 Objective Swallowing Evaluation: Type of Study: Bedside Swallow Evaluation  Patient Details Name: Yaniah Thiemann MRN: [id number] Date of Birth: [birth year] Today's Date: 09/21/2020 Time: SLP Start Time (ACUTE ONLY): 1330 -SLP Stop Time (ACUTE ONLY): 1350 SLP Time Calculation (min) (ACUTE ONLY): 20 min Past Medical History: Past Medical History: Diagnosis Date . CKD (chronic kidney disease)  . Complication of anesthesia   PER FAMILY PATIENT HAD A SEIZURE WHEN " THEY PUT A ROD IN HER LEG " . Diabetes mellitus without complication [facility])  Past Surgical History: Past Surgical History: Procedure Laterality Date . APPENDECTOMY  09/18/2020 . CYSTOSCOPY W/ URETERAL STENT PLACEMENT Left 08/18/2020  Procedure: CYSTOSCOPY WITH RETROGRADE PYELOGRAM/URETERAL STENT PLACEMENT;  Surgeon: [name], MD;  Location: [place];   Service: Urology;  Laterality: Left; . IR FLUORO GUIDE CV LINE RIGHT  08/23/2020 . IR REMOVAL TUN CV CATH W/O FL  08/31/2020 . IR US GUIDE VASC ACCESS RIGHT  08/23/2020 . LAPAROSCOPIC APPENDECTOMY N/A 09/18/2020  Procedure: APPENDECTOMY LAPAROSCOPIC;  Surgeon: Coralie Keens, MD;  Location: [place];  Service: General;  Laterality: N/A; . TOTAL HIP ARTHROPLASTY Right 03/05/2020  Procedure: HIP GIRDLE STONE;  Surgeon: Leandrew Koyanagi, MD;  Location: [place];  Service: Orthopedics;  Laterality: Right; HPI: Ms.Shinn is a 85 yo F w/ PMH of IDDm, CKD3, Hypoalbuminemia, Chronic R hip fracture and recent admission for pyelonephritis with ESBL kleb bacteremia presenting to [facility] with abdominal pain due to appendicitis. Seen by SLP in 10/21: recommended dys 1 diet with thin liquids 2/2 instances of coughing and multiple swallows. SLP s/o as pt appeared to be at baseline functioning. CT abdomen: Lungs: Bibasilar atelectasis and effusions.  Subjective: pt very uncomfortable in bed Assessment / Plan / Recommendation CHL IP CLINICAL IMPRESSIONS 09/21/2020 Clinical Impression Pt presents with pharyngeal dysphagia characterized by a pharyngeal delay which resulted in penetration (PAS 3,5) with thin liquids and penetration (PAS 2) with nectar thick liquids. Penetration of thin liquids inconsistently resulted in silent trace aspiration (PAS 8). Depth of penetration was improved with use of smaller bolus sizes but ultimate aspiration was still inconsistently demonstrated. It is noteworthy that the study was conducted under optimal conditions with the pt sitting fully upright. However, considering the pt and her daughter's reports of pt having p.o. intake in a reclined position, her risk of aspiration would be notably increased. It is anticipated that quantity and frequency of aspiration increases with pt in a reclined position. Mastication was prolonged with regular textures but this did not appear to significantly impact safety. Esophageal  screening revealed retention of barium in the mid to lower thoracic esophagus; determination of the etiology of this is beyond the scope of this study; however, pt's daughter did report the pt having esophageal-related issues since birth. A regular texture diet with nectar thick liquids is recommended at this time. SLP will follow for dysphagia treatment.  SLP Visit Diagnosis Dysphagia, pharyngoesophageal phase (R13.14) Attention and concentration deficit following -- Frontal lobe and executive function deficit following -- Impact on safety and function Moderate aspiration risk;Mild aspiration risk   CHL IP TREATMENT RECOMMENDATION 09/21/2020 Treatment Recommendations Therapy as outlined in treatment plan below   Prognosis 09/21/2020 Prognosis for Safe Diet Advancement Good Barriers to Reach Goals Other (Comment);Time  post onset Barriers/Prognosis Comment -- CHL IP DIET RECOMMENDATION 09/21/2020 SLP Diet Recommendations Regular solids;Nectar thick liquid Liquid Administration via Cup;Straw Medication Administration Whole meds with puree Compensations Minimize environmental distractions;Slow rate;Small sips/bites Postural Changes Seated upright at 90 degrees   CHL IP OTHER RECOMMENDATIONS 09/21/2020 Recommended Consults -- Oral Care Recommendations Oral care BID Other Recommendations --   CHL IP FOLLOW UP RECOMMENDATIONS 09/21/2020 Follow up Recommendations Home health SLP   CHL IP FREQUENCY AND DURATION 09/21/2020 Speech Therapy Frequency (ACUTE ONLY) min 2x/week Treatment Duration 2 weeks      CHL IP ORAL PHASE 09/21/2020 Oral Phase WFL Oral - Pudding Teaspoon -- Oral - Pudding Cup -- Oral - Honey Teaspoon -- Oral - Honey Cup -- Oral - Nectar Teaspoon -- Oral - Nectar Cup -- Oral - Nectar Straw -- Oral - Thin Teaspoon -- Oral - Thin Cup -- Oral - Thin Straw -- Oral - Puree -- Oral - Mech Soft -- Oral - Regular -- Oral - Multi-Consistency -- Oral - Pill -- Oral Phase - Comment --  CHL IP PHARYNGEAL PHASE 09/21/2020  Pharyngeal Phase Impaired Pharyngeal- Pudding Teaspoon -- Pharyngeal -- Pharyngeal- Pudding Cup -- Pharyngeal -- Pharyngeal- Honey Teaspoon -- Pharyngeal -- Pharyngeal- Honey Cup -- Pharyngeal -- Pharyngeal- Nectar Teaspoon -- Pharyngeal -- Pharyngeal- Nectar Cup Delayed swallow initiation-vallecula;Penetration/Aspiration during swallow Pharyngeal Material enters airway, remains ABOVE vocal cords then ejected out Pharyngeal- Nectar Straw Delayed swallow initiation-vallecula;Penetration/Aspiration during swallow Pharyngeal Material enters airway, remains ABOVE vocal cords then ejected out Pharyngeal- Thin Teaspoon -- Pharyngeal -- Pharyngeal- Thin Cup Delayed swallow initiation-vallecula;Penetration/Aspiration during swallow Pharyngeal Material enters airway, remains ABOVE vocal cords and not ejected out;Material enters airway, passes BELOW cords without attempt by patient to eject out (silent aspiration) Pharyngeal- Thin Straw Delayed swallow initiation-vallecula;Penetration/Aspiration during swallow Pharyngeal Material enters airway, remains ABOVE vocal cords and not ejected out;Material enters airway, CONTACTS cords and not ejected out;Material enters airway, passes BELOW cords without attempt by patient to eject out (silent aspiration) Pharyngeal- Puree Delayed swallow initiation-vallecula Pharyngeal -- Pharyngeal- Mechanical Soft -- Pharyngeal -- Pharyngeal- Regular Delayed swallow initiation-vallecula Pharyngeal -- Pharyngeal- Multi-consistency Delayed swallow initiation-vallecula Pharyngeal -- Pharyngeal- Pill Delayed swallow initiation-vallecula;Penetration/Aspiration during swallow  Pharyngeal -- Pharyngeal Comment --  CHL IP CERVICAL ESOPHAGEAL PHASE 09/21/2020 Cervical Esophageal Phase Impaired Pudding Teaspoon -- Pudding Cup -- Honey Teaspoon -- Honey Cup -- Nectar Teaspoon -- Nectar Cup -- Nectar Straw -- Thin Teaspoon -- Thin Cup -- Thin Straw (No Data); See impressions Puree -- Mechanical Soft --  Regular -- Multi-consistency -- Pill -- Cervical Esophageal Comment -- Tobie Poet I. Hardin Negus, Anon Raices, [place] Office number [phone] Pager [phone] Horton Marshall 09/21/2020, 2:27 PM              US Abdomen Limited RUQ (LIVER/GB)  Result Date: 09/21/2020 CLINICAL DATA:  Right upper quadrant pain EXAM: ULTRASOUND ABDOMEN LIMITED RIGHT UPPER QUADRANT COMPARISON:  CT 1 day prior FINDINGS: Gallbladder: There is cholelithiasis without evidence for gallbladder wall thickening. There is gallbladder sludge. The sonographic Percell Miller sign is reported as positive. Common bile duct: Diameter: 3 mm Liver: The liver is heterogeneous with a nodular contour. Portal vein is patent on color Doppler imaging with normal direction of blood flow towards the liver. Other: There is a right-sided pleural effusion. There is a small volume of ascites. IMPRESSION: 1. Findings are equivocal for acute calculus cholecystitis. While the sonographic Percell Miller sign is positive in the presence of gallstones, there is no gallbladder wall thickening. 2. Cirrhotic  appearing liver. 3. Small volume ascites. 4. Incidentally noted right-sided pleural effusion. Electronically Signed   By: Constance Holster M.D.   On: 09/21/2020 19:49    I independently reviewed the above imaging studies.  Impression/Recommendation: 1. History of emphysematous cystitis and emphysematous left pyelitis:Seen on CT A/P 08/17/2020. S/p cystoscopy, left ureteral stent placement on 08/18/2020. CT A/P 08/20/2020 with decompressed left renal collecting system with no gas in the collecting system and bladder decompressed around a Foley catheter with no further gas within the bladder. Received prolonged course of antibiotics and ultimately discharged home. Represented with ESWL Klebsiella. CT A/P 09/20/2020 revealed left ureteral stent placement in appropriate position with no hydronephrosis and collapsed bladder around a Foley  catheter. 2. ESWL Klebsiella UTI: Urine culture 09/20/2020 with greater than 100,000 Klebsiella ESBL. On Merrem. 3. AKI on CKD 3:Creatinine 2.1 from baseline of 1.3.CT A/P 11/10 with stent in appropriate position with no hydronephrosis. 4. History of type 2 diabetes  -Leave left ureteral stent in place given active infection. We will plan for removal in the office when her infection subsides. She has follow-up with [facility] on 10/12/2020. -Continue broad-spectrum antibiotics with meropenem per ID -Reviewed CT A/P 11/10 with no hydronephrosis and stent in appropriate position. -Foley to gravity, stent to remain in place while infection present  [name] 09/23/2020, 2:36 AM  Matt R. Ashlye Oviedo MD [facility]  Pager: [phone]   CC: Aldine Contes, MD

## 2020-09-23 NOTE — Progress Notes (Signed)
Pt's b/p 90/38.  Pt does not endorse any symptoms. MD notified.  LR 500 cc bolus ordered.  Recheck b/p after interventions, b/p 113/41.  Will continue to monitor.

## 2020-09-23 NOTE — Consult Note (Signed)
Palliative Medicine Inpatient Consult Note  Reason for consult:  Goals of Care "Goals of care, code status"  HPI:  Per intake H&P --> [name] is a 85 year old female with past medical history of IDDM, CKD3, hypoalbuminemia, chronic right hip fracture, recent admission for emphysematous cystitis and left-sided emphysematous pyelitis with ESBL klebsiella bacteremia who presented to [facility] on 11/08 with right lower quadrant abdominal pain found to have acute appendicitis s/p appendectomy on 11/08.  Palliative care had met [name] in May of this year at which time she wanted full scope of treatment unless she was in the community and had a cardiac arrest in which case she opted for do not resuscitate.   Clinical Assessment/Goals of Care: I have reviewed medical records including EPIC notes, labs and imaging, received report from bedside RN, assessed the patient who was resting comfortably in bed - noted to be somnolent.   I called Whitney [name] (daughter) to further discuss diagnosis prognosis, GOC, EOL wishes, disposition and options.   I introduced Palliative Medicine as specialized medical care for people living with serious illness. It focuses on providing relief from the symptoms and stress of a serious illness. The goal is to improve quality of life for both the patient and the family.  I asked [name] to tell me about herself. She is from [place] but came to the [place] in 2006 and lived in [place]. She is a permanent [place] resident but never applied for citizenship. She went back to [place] in 2014 and had been living there until April. She now lives in [place] with her daughter, Whitney [name]. She is widowed. Her husband died when she was 52. She has 6 children. One daughter is in [place] and sons are in [place]. 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 They have no other family here. Whitney [name] is part of the [employer].   At baseline patient has a very poor functional status  and is completely dependent on her daughter for all bADL's. She is bed bound.   A detailed discussion was had today regarding advanced directives, there are none on file though Whitney [name] would be interested in completing these while she is here.    Concepts specific to code status, artifical feeding and hydration, continued IV antibiotics and rehospitalization was had.  PMT discussed that the last time the patient was here - she opted for DNR if she suffered a full cardioplumonary arrest. Whitney [name] shares that she has spoken since to her mother about this and she has retracted this DNR order. She would want everything done to maintain living inclusive of cardiopulmonary resuscitation, intubation, shocks, antiarrythmic's, transfer to the ICU. She wishes for Whitney [name] to be full code. I shared my concerns inclusive of rib fractures, pneumothorax /hemathorax in addition to decreased function and an even poorer quality of life than present (bedbound). I expressed that patients who suffer from the degree of malnutrition and frailty as her mother does do not often live through these events and if they do they are not the same person afterwards. I asked her if she would be okay with her mother enduring such trauma and she said "yes" "keep her alive". I tried to further explore if this was related to her Muslim faith which Whitney [name] said it was not.   I shared with Whitney [name] that it at this point would be within reason to consider hospice as Whitney [name] has profound failure to thrive contributing to weight loss and muscle deterioration.  I described hospice as a service for patients for have a life expectancy of < 59month. It preserves dignity and quality at the end phases of life. The focus changes from curative to symptom relief.  Whitney [name] went on to share with me that she does not want any services that involved people coming into her home as she only trust herself to care for her mother. She states that her mother was enrolled in the [facility]  program and it was the two week delay in care for her appendicitis that she holds them responsible for. She also states that a care aid who cleaned her did a poor job and this is why her mother has a urinary infection. She feels that no one other than her can keep her mother healthy.   RCaro Hightgoes on to explain the she was healthy from May to October under her direct care. She asks that I reach out to our MSW team to verify she will get paid for taking care of her mother as "she needs income." I asked her how she is surviving now, she stated [place] to care for her mother. She states that she would ideally like to get her family over here - more notably her sisters as they would "help with her mother."  Discussed the importance of continued conversation with family and their  medical providers regarding overall plan of care and treatment options, ensuring decisions are within the context of the patients values and GOCs.  Provided "Hard Choices for [place] booklet.   Decision Maker: If patient unable to make decisions for herself her daughter, RMilta Deiterswould make decisions for her [phone] SUMMARY OF RECOMMENDATIONS   Full Code / Full Scope of Treatment - Strongly recommended DNAR/DNI given mothers frailty expressed that she would likely not survive a major event and in the process we would cause her body tremendous burden  I worry that perhaps the whole degree is the patients illness is not being seen by the patients daughter at this time  [place] to evaluate the financial situation as per consult  Spiritual Support - [place]  We will continue to offer support as able  Code Status/Advance Care Planning: FULL CODE   Symptom Management:  Pain, RLQ: -Acetaminophen 6542mTID ATC -Continue Oxycodone 16m3m4H PRN has not received any in the last 24 hours -Morphine 1-4mg86mH PRN for breakthrough pain has not received  any in the last 24 hours  Oral Thrush: -Nystatin QID  Muscular Deconditioning: -PT/OT  Severe Protein Calorie Malnutrition: - Dietary involved   Palliative Prophylaxis:   Oral Care, Mobility  Additional Recommendations (Limitations, Scope, Preferences):  Full scope of treatment   Psycho-social/Spiritual:   Desire for further Chaplaincy support: Yes  Additional Recommendations: Education on palliative care    Prognosis: Unclear based upon poor functional state  Discharge Planning[facility]   Vitals:   09/22/20 1900 09/23/20 0353  BP: (!) 117/47 (!) 110/45  Pulse: 90 91  Resp: 19 19  Temp: 99 F (37.2 C) 98.9 F (37.2 C)  SpO2: 100% 100%    Intake/Output Summary (Last 24 hours) at 09/23/2020 0853 Last data filed at 09/23/2020 0600 Gross per 24 hour  Intake 560 ml  Output 200 ml  Net 360 ml   Last Weight  Most recent update: 09/21/2020  5:43 AM   Weight  82.1 kg (181 lb)  Gen:  Frail elderly F in NAD HEENT: dry mucous membranes CV: Regular rate and rhythm  PULM: clear to auscultation bilaterally. No wheezes/rales/rhonchi  ABD: (+) tenderness in RUQ EXT: 3+ LE edema  Neuro: Somnolent   PPS: 20%   This conversation/these recommendations were discussed with patient primary care team.  Time In:  1100 Time Out: 1300 Total Time: 120 Greater than 50%  of this time was spent counseling and coordinating care related to the above assessment and plan.  [facility] Team Team Cell Phone: [phone] Please utilize secure chat with additional questions, if there is no response within 30 minutes please call the above phone number  Palliative Medicine Team providers are available by phone from 7am to 7pm daily and can be reached through the team cell phone.  Should this patient require assistance outside of these hours, please call the patient's attending physician.

## 2020-09-23 NOTE — Progress Notes (Addendum)
INFECTIOUS DISEASE PROGRESS NOTE  ID: Penney Domanski is a 85 y.o. female with  Active Problems:   Appendicitis   S/P appendectomy  Subjective: C/o bladder pain.   Abtx:  Anti-infectives (From admission, onward)   Start     Dose/Rate Route Frequency Ordered Stop   09/20/20 1200  meropenem (MERREM) 1 g in sodium chloride 0.9 % 100 mL IVPB        1 g 200 mL/hr over 30 Minutes Intravenous Every 12 hours 09/20/20 1101     09/18/20 1115  cefTRIAXone (ROCEPHIN) 2 g in sodium chloride 0.9 % 100 mL IVPB        2 g 200 mL/hr over 30 Minutes Intravenous  Once 09/18/20 1103 09/18/20 1238   09/18/20 1115  metroNIDAZOLE (FLAGYL) IVPB 500 mg        500 mg 100 mL/hr over 60 Minutes Intravenous  Once 09/18/20 1103 09/18/20 1309      Medications:  Scheduled: . acetaminophen  650 mg Oral TID  . Chlorhexidine Gluconate Cloth  6 each Topical Daily  . enoxaparin (LOVENOX) injection  30 mg Subcutaneous Q24H  . feeding supplement  1 Container Oral TID BM  . insulin aspart  0-9 Units Subcutaneous TID AC & HS  . multivitamin with minerals  1 tablet Oral Daily  . nystatin  5 mL Oral QID    Objective: Vital signs in last 24 hours: Temp:  [98.3 F (36.8 C)-99 F (37.2 C)] 98.9 F (37.2 C) (11/13 0353) Pulse Rate:  [86-91] 91 (11/13 0353) Resp:  [19] 19 (11/13 0353) BP: (105-117)/(44-47) 110/45 (11/13 0353) SpO2:  [100 %] 100 % (11/13 0353)   General appearance: alert, cooperative and no distress Resp: clear to auscultation bilaterally Cardio: regular rate and rhythm GI: normal findings: bowel sounds normal and soft, non-tender Extremities: edema 3+  Lab Results Recent Labs    09/22/20 0102 09/23/20 0227  WBC 10.2 7.6  HGB 7.7* 7.5*  HCT 25.1* 24.4*  NA 140 142  K 3.9 4.1  CL 109 111  CO2 24 25  BUN 26* 26*  CREATININE 2.12* 1.96*   Liver Panel Recent Labs    09/21/20 0053 09/22/20 0102  PROT 4.9* 4.8*  ALBUMIN 1.5* 1.3*  AST 21 16  ALT 12 11  ALKPHOS 101 114    BILITOT 0.7 0.8   Sedimentation Rate No results for input(s): ESRSEDRATE in the last 72 hours. C-Reactive Protein No results for input(s): CRP in the last 72 hours.  Microbiology: Recent Results (from the past 240 hour(s))  Respiratory Panel by RT PCR (Flu A&B, Covid) - Nasopharyngeal Swab     Status: None   Collection Time: 09/18/20 11:19 AM   Specimen: Nasopharyngeal Swab  Result Value Ref Range Status   SARS Coronavirus 2 by RT PCR NEGATIVE NEGATIVE Final    Comment: (NOTE) SARS-CoV-2 target nucleic acids are NOT DETECTED.  The SARS-CoV-2 RNA is generally detectable in upper respiratoy specimens during the acute phase of infection. The lowest concentration of SARS-CoV-2 viral copies this assay can detect is 131 copies/mL. A negative result does not preclude SARS-Cov-2 infection and should not be used as the sole basis for treatment or other patient management decisions. A negative result may occur with  improper specimen collection/handling, submission of specimen other than nasopharyngeal swab, presence of viral mutation(s) within the areas targeted by this assay, and inadequate number of viral copies (<131 copies/mL). A negative result must be combined with clinical observations, patient history, and epidemiological  information. The expected result is Negative.  Fact Sheet for Patients:  PinkCheek.be  Fact Sheet for Healthcare Providers:  GravelBags.it  This test is no t yet approved or cleared by the [place] FDA and  has been authorized for detection and/or diagnosis of SARS-CoV-2 by FDA under an Emergency Use Authorization (EUA). This EUA will remain  in effect (meaning this test can be used) for the duration of the COVID-19 declaration under Section 564(b)(1) of the Act, 21 U.S.C. section 360bbb-3(b)(1), unless the authorization is terminated or revoked sooner.     Influenza A by PCR NEGATIVE  NEGATIVE Final   Influenza B by PCR NEGATIVE NEGATIVE Final    Comment: (NOTE) The Xpert Xpress SARS-CoV-2/FLU/RSV assay is intended as an aid in  the diagnosis of influenza from Nasopharyngeal swab specimens and  should not be used as a sole basis for treatment. Nasal washings and  aspirates are unacceptable for Xpert Xpress SARS-CoV-2/FLU/RSV  testing.  Fact Sheet for Patients: PinkCheek.be  Fact Sheet for Healthcare Providers: GravelBags.it  This test is not yet approved or cleared by the [place] FDA and  has been authorized for detection and/or diagnosis of SARS-CoV-2 by  FDA under an Emergency Use Authorization (EUA). This EUA will remain  in effect (meaning this test can be used) for the duration of the  Covid-19 declaration under Section 564(b)(1) of the Act, 21  U.S.C. section 360bbb-3(b)(1), unless the authorization is  terminated or revoked. Performed at [facility], [place] [address], [place], [place] [address]   Culture, Urine     Status: Abnormal   Collection Time: 09/20/20  6:23 AM   Specimen: Urine, Catheterized  Result Value Ref Range Status   Specimen Description URINE, CATHETERIZED  Final   Special Requests   Final    NONE Performed at [facility], [place] [address], [place], [place] [address]    Culture (A)  Final    >=100,000 COLONIES/mL KLEBSIELLA PNEUMONIAE Confirmed Extended Spectrum Beta-Lactamase Producer (ESBL).  In bloodstream infections from ESBL organisms, carbapenems are preferred over piperacillin/tazobactam. They are shown to have a lower risk of mortality.    Report Status 09/22/2020 FINAL  Final   Organism ID, Bacteria KLEBSIELLA PNEUMONIAE (A)  Final      Susceptibility   Klebsiella pneumoniae - MIC*    AMPICILLIN >=32 RESISTANT Resistant     CEFAZOLIN >=64 RESISTANT Resistant     CEFEPIME >=32 RESISTANT Resistant     CEFTRIAXONE >=64 RESISTANT Resistant      CIPROFLOXACIN <=0.25 SENSITIVE Sensitive     GENTAMICIN >=16 RESISTANT Resistant     IMIPENEM <=0.25 SENSITIVE Sensitive     NITROFURANTOIN 128 RESISTANT Resistant     TRIMETH/SULFA >=320 RESISTANT Resistant     AMPICILLIN/SULBACTAM >=32 RESISTANT Resistant     PIP/TAZO 16 SENSITIVE Sensitive     * >=100,000 COLONIES/mL KLEBSIELLA PNEUMONIAE    Studies/Results: DG Swallowing Func-Speech Pathology  Result Date: 09/21/2020 Objective Swallowing Evaluation: Type of Study: Bedside Swallow Evaluation  Patient Details Name: Ismael Treptow MRN: [id number] Date of Birth: 1935-05-21 Today's Date: 09/21/2020 Time: SLP Start Time (ACUTE ONLY): 1330 -SLP Stop Time (ACUTE ONLY): 1350 SLP Time Calculation (min) (ACUTE ONLY): 20 min Past Medical History: Past Medical History: Diagnosis Date . CKD (chronic kidney disease)  . Complication of anesthesia   PER FAMILY PATIENT HAD A SEIZURE WHEN " THEY PUT A ROD IN HER LEG " . Diabetes mellitus without complication [facility])  Past Surgical History: Past Surgical  History: Procedure Laterality Date . APPENDECTOMY  09/18/2020 . CYSTOSCOPY W/ URETERAL STENT PLACEMENT Left 08/18/2020  Procedure: CYSTOSCOPY WITH RETROGRADE PYELOGRAM/URETERAL STENT PLACEMENT;  Surgeon: [name], MD;  Location: [facility];  Service: Urology;  Laterality: Left; . IR FLUORO GUIDE CV LINE RIGHT  08/23/2020 . IR REMOVAL TUN CV CATH W/O FL  08/31/2020 . IR US GUIDE VASC ACCESS RIGHT  08/23/2020 . LAPAROSCOPIC APPENDECTOMY N/A 09/18/2020  Procedure: APPENDECTOMY LAPAROSCOPIC;  Surgeon: Coralie Keens, MD;  Location: [facility];  Service: General;  Laterality: N/A; . TOTAL HIP ARTHROPLASTY Right 03/05/2020  Procedure: HIP GIRDLE STONE;  Surgeon: Leandrew Koyanagi, MD;  Location: [facility];  Service: Orthopedics;  Laterality: Right; HPI: Ms.Buick is a 85 yo F w/ PMH of IDDm, CKD3, Hypoalbuminemia, Chronic R hip fracture and recent admission for pyelonephritis with ESBL kleb bacteremia presenting to [facility] with abdominal pain due  to appendicitis. Seen by SLP in 10/21: recommended dys 1 diet with thin liquids 2/2 instances of coughing and multiple swallows. SLP s/o as pt appeared to be at baseline functioning. CT abdomen: Lungs: Bibasilar atelectasis and effusions.  Subjective: pt very uncomfortable in bed Assessment / Plan / Recommendation CHL IP CLINICAL IMPRESSIONS 09/21/2020 Clinical Impression Pt presents with pharyngeal dysphagia characterized by a pharyngeal delay which resulted in penetration (PAS 3,5) with thin liquids and penetration (PAS 2) with nectar thick liquids. Penetration of thin liquids inconsistently resulted in silent trace aspiration (PAS 8). Depth of penetration was improved with use of smaller bolus sizes but ultimate aspiration was still inconsistently demonstrated. It is noteworthy that the study was conducted under optimal conditions with the pt sitting fully upright. However, considering the pt and her daughter's reports of pt having p.o. intake in a reclined position, her risk of aspiration would be notably increased. It is anticipated that quantity and frequency of aspiration increases with pt in a reclined position. Mastication was prolonged with regular textures but this did not appear to significantly impact safety. Esophageal screening revealed retention of barium in the mid to lower thoracic esophagus; determination of the etiology of this is beyond the scope of this study; however, pt's daughter did report the pt having esophageal-related issues since birth. A regular texture diet with nectar thick liquids is recommended at this time. SLP will follow for dysphagia treatment.  SLP Visit Diagnosis Dysphagia, pharyngoesophageal phase (R13.14) Attention and concentration deficit following -- Frontal lobe and executive function deficit following -- Impact on safety and function Moderate aspiration risk;Mild aspiration risk   CHL IP TREATMENT RECOMMENDATION 09/21/2020 Treatment Recommendations Therapy as outlined  in treatment plan below   Prognosis 09/21/2020 Prognosis for Safe Diet Advancement Good Barriers to Reach Goals Other (Comment);Time post onset Barriers/Prognosis Comment -- CHL IP DIET RECOMMENDATION 09/21/2020 SLP Diet Recommendations Regular solids;Nectar thick liquid Liquid Administration via Cup;Straw Medication Administration Whole meds with puree Compensations Minimize environmental distractions;Slow rate;Small sips/bites Postural Changes Seated upright at 90 degrees   CHL IP OTHER RECOMMENDATIONS 09/21/2020 Recommended Consults -- Oral Care Recommendations Oral care BID Other Recommendations --   CHL IP FOLLOW UP RECOMMENDATIONS 09/21/2020 Follow up Recommendations Home health SLP   CHL IP FREQUENCY AND DURATION 09/21/2020 Speech Therapy Frequency (ACUTE ONLY) min 2x/week Treatment Duration 2 weeks      CHL IP ORAL PHASE 09/21/2020 Oral Phase WFL Oral - Pudding Teaspoon -- Oral - Pudding Cup -- Oral - Honey Teaspoon -- Oral - Honey Cup -- Oral - Nectar Teaspoon -- Oral - Nectar Cup -- Oral - Nectar  Straw -- Oral - Thin Teaspoon -- Oral - Thin Cup -- Oral - Thin Straw -- Oral - Puree -- Oral - Mech Soft -- Oral - Regular -- Oral - Multi-Consistency -- Oral - Pill -- Oral Phase - Comment --  CHL IP PHARYNGEAL PHASE 09/21/2020 Pharyngeal Phase Impaired Pharyngeal- Pudding Teaspoon -- Pharyngeal -- Pharyngeal- Pudding Cup -- Pharyngeal -- Pharyngeal- Honey Teaspoon -- Pharyngeal -- Pharyngeal- Honey Cup -- Pharyngeal -- Pharyngeal- Nectar Teaspoon -- Pharyngeal -- Pharyngeal- Nectar Cup Delayed swallow initiation-vallecula;Penetration/Aspiration during swallow Pharyngeal Material enters airway, remains ABOVE vocal cords then ejected out Pharyngeal- Nectar Straw Delayed swallow initiation-vallecula;Penetration/Aspiration during swallow Pharyngeal Material enters airway, remains ABOVE vocal cords then ejected out Pharyngeal- Thin Teaspoon -- Pharyngeal -- Pharyngeal- Thin Cup Delayed swallow  initiation-vallecula;Penetration/Aspiration during swallow Pharyngeal Material enters airway, remains ABOVE vocal cords and not ejected out;Material enters airway, passes BELOW cords without attempt by patient to eject out (silent aspiration) Pharyngeal- Thin Straw Delayed swallow initiation-vallecula;Penetration/Aspiration during swallow Pharyngeal Material enters airway, remains ABOVE vocal cords and not ejected out;Material enters airway, CONTACTS cords and not ejected out;Material enters airway, passes BELOW cords without attempt by patient to eject out (silent aspiration) Pharyngeal- Puree Delayed swallow initiation-vallecula Pharyngeal -- Pharyngeal- Mechanical Soft -- Pharyngeal -- Pharyngeal- Regular Delayed swallow initiation-vallecula Pharyngeal -- Pharyngeal- Multi-consistency Delayed swallow initiation-vallecula Pharyngeal -- Pharyngeal- Pill Delayed swallow initiation-vallecula;Penetration/Aspiration during swallow  Pharyngeal -- Pharyngeal Comment --  CHL IP CERVICAL ESOPHAGEAL PHASE 09/21/2020 Cervical Esophageal Phase Impaired Pudding Teaspoon -- Pudding Cup -- Honey Teaspoon -- Honey Cup -- Nectar Teaspoon -- Nectar Cup -- Nectar Straw -- Thin Teaspoon -- Thin Cup -- Thin Straw (No Data); See impressions Puree -- Mechanical Soft -- Regular -- Multi-consistency -- Pill -- Cervical Esophageal Comment -- Tobie Poet I. Hardin Negus, [place], [place] Office number [phone] Pager [phone] Horton Marshall 09/21/2020, 2:27 PM              US Abdomen Limited RUQ (LIVER/GB)  Result Date: 09/21/2020 CLINICAL DATA:  Right upper quadrant pain EXAM: ULTRASOUND ABDOMEN LIMITED RIGHT UPPER QUADRANT COMPARISON:  CT 1 day prior FINDINGS: Gallbladder: There is cholelithiasis without evidence for gallbladder wall thickening. There is gallbladder sludge. The sonographic Percell Miller sign is reported as positive. Common bile duct: Diameter: 3 mm Liver: The liver is heterogeneous with a nodular  contour. Portal vein is patent on color Doppler imaging with normal direction of blood flow towards the liver. Other: There is a right-sided pleural effusion. There is a small volume of ascites. IMPRESSION: 1. Findings are equivocal for acute calculus cholecystitis. While the sonographic Percell Miller sign is positive in the presence of gallstones, there is no gallbladder wall thickening. 2. Cirrhotic appearing liver. 3. Small volume ascites. 4. Incidentally noted right-sided pleural effusion. Electronically Signed   By: Constance Holster M.D.   On: 09/21/2020 19:49     Assessment/Plan: ESBL K pnuemo UTI  L Ureteral stent 110-8-21 Appendicitis  Appendectomy 09-18-20 ARF on CKD Anemia Cirrhosis Protein Calorie Malnutrition, severe.  Palliative care f/u   Total days of antibiotics: 4 merrem  Would give her 6 more days/10 days total of merrem   This could be changed to Invanz to give once daily as outpt She has f/u with uro on 12-2 Watch her Cr Appreciate palliative care f/u Appreciate outstanding care she is getting from IMTS and their understanding of the subtitles of her care and her faith tradition.   Comment- Although this is a long course for UTI, her stent  complicates this.  She most likely needs the stent out.  Would only recheck her UCx if she has si/sx. available as needed           Bobby Rumpf MD, FACP Infectious Diseases (pager) [phone] [email or web address] 09/23/2020, 8:22 AM  LOS: 5 days

## 2020-09-23 NOTE — Progress Notes (Signed)
Pharmacy Antibiotic Note  Whitney [name] is a 85 y.o. female admitted on 09/18/2020 with abdominal pain, found to have appendicitis now s/p appendectomy on 09/18/20.   Pharmacy has been consulted for Merrem dosing for ESBL K. pneumo UTI.  Patient has a history of ESBL Klebsiella bacteremia and emphysematous pyelitis.  SCr improve to 1.96, afebrile, WBC down to 7.6  Plan: - Continue merrem 1gm IV Q12H, plan for 10 days of total therapy - Monitor renal fxn, micro data   Height: 5\' 3"  (160 cm) Weight: 82.1 kg (181 lb) IBW/kg (Calculated) : 52.4  Temp (24hrs), Avg:98.7 F (37.1 C), Min:98.3 F (36.8 C), Max:99 F (37.2 C)  Recent Labs  Lab 09/18/20 0800 09/19/20 0502 09/20/20 0240 09/20/20 1114 09/21/20 0053 09/22/20 0102 09/23/20 0227  WBC 8.0   < > 17.1* 19.3* 11.2* 10.2 7.6  CREATININE 1.41*   < > 1.62* 1.71* 1.83* 2.12* 1.96*  LATICACIDVEN 0.9  --   --   --   --   --   --    < > = values in this interval not displayed.    Estimated Creatinine Clearance: 22.1 mL/min (A) (by C-G formula based on SCr of 1.96 mg/dL (H)).    Allergies  Allergen Reactions  . Succinylcholine Other (See Comments)    No formal diagnosis of pseudocholinesterase deficiency; however, prolonged paralysis during anesthetics on 09/18/20 and 08/18/20.    CTX x1 11/8 Flagyl x1 11/8 Merrem 11/10 >>  11/10 UCx - esbl kleb pna      Whitney [name], [place] PGY2 Pharmacy Resident Weekends 7:00 am - 3:00 pm, please call [phone] 09/23/20      10:04 AM  Please check AMION for all [facility] phone numbers After 10:00 PM, call the [place] [phone]

## 2020-09-23 NOTE — Progress Notes (Signed)
Subjective:   Ms. 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  Overnight, no acute events.  This morning, daughter provided translation for our team as [employer] not able to provide appropriate translation due to patient's dialect. Patient initially reported to not have any abdominal pain, but later stated that she had pain in her bladder. Additionally, patient reports having a strong appetite to which the daughter reported that she will be bringing the patient homemade soup within the next hour. Otherwise, patient denies any nausea, vomiting, fevers, chills. She has no further questions or concerns.  Objective:  Vital signs in last 24 hours: Vitals:   09/22/20 0514 09/22/20 1618 09/22/20 1900 09/23/20 0353  BP: (!) 98/58 (!) 105/44 (!) 117/47 (!) 110/45  Pulse: 99 86 90 91  Resp:  19 19 19   Temp: 98.1 F (36.7 C) 98.3 F (36.8 C) 99 F (37.2 C) 98.9 F (37.2 C)  TempSrc: Axillary Oral Axillary Oral  SpO2: 100% 100% 100% 100%  Weight:      Height:      On room air Filed Weights   09/18/20 1158 09/21/20 0500  Weight: 90 kg 82.1 kg    Intake/Output Summary (Last 24 hours) at 09/23/2020 1119 Last data filed at 09/23/2020 0600 Gross per 24 hour  Intake 380 ml  Output 200 ml  Net 180 ml   Physical Exam Vitals and nursing note reviewed. Exam conducted with a chaperone present.  Constitutional:      General: She is not in acute distress.    Appearance: She is obese.  HENT:     Head: Normocephalic and  atraumatic.  Cardiovascular:     Rate and Rhythm: Normal rate and regular rhythm.     Heart sounds: Normal heart sounds.  Pulmonary:     Effort: Pulmonary effort is normal. No respiratory distress.     Breath sounds: Normal breath sounds.  Abdominal:     General: Abdomen is flat. Bowel sounds are normal. There is no distension.     Palpations: Abdomen is soft.     Tenderness: There is abdominal tenderness in the suprapubic area.     Comments: Surgical incisions clean, dry with no drainage  Musculoskeletal:     Right lower leg: 2+ Pitting Edema present.     Left lower leg: 2+ Pitting Edema present.  Skin:    General: Skin is warm and dry.     Capillary Refill: Capillary refill takes less than 2 seconds.  Neurological:     General: No focal deficit present.     Mental Status: She is alert and oriented to person, place, and time.  Psychiatric:        Mood and Affect: Mood normal.        Behavior: Behavior normal.    CBC Latest Ref Rng & Units 09/23/2020 09/22/2020 09/21/2020  WBC 4.0 - 10.5 K/uL 7.6 10.2 11.2(H)  Hemoglobin 12.0 - 15.0 g/dL 7.5(L) 7.7(L) 7.5(L)  Hematocrit 36 - 46 % 24.4(L) 25.1(L) 24.8(L)  Platelets 150 - 400 K/uL 107(L) 112(L) 112(L)   CMP Latest Ref Rng & Units 09/23/2020 09/22/2020 09/21/2020  Glucose 70 -  99 mg/dL 175(H) 218(H) 146(H)  BUN 8 - 23 mg/dL 26(H) 26(H) 22  Creatinine 0.44 - 1.00 mg/dL 1.96(H) 2.12(H) 1.83(H)  Sodium 135 - 145 mmol/L 142 140 141  Potassium 3.5 - 5.1 mmol/L 4.1 3.9 3.9  Chloride 98 - 111 mmol/L 111 109 111  CO2 22 - 32 mmol/L 25 24 23   Calcium 8.9 - 10.3 mg/dL 7.5(L) 7.2(L) 7.4(L)  Total Protein 6.5 - 8.1 g/dL - 4.8(L) 4.9(L)  Total Bilirubin 0.3 - 1.2 mg/dL - 0.8 0.7  Alkaline Phos 38 - 126 U/L - 114 101  AST 15 - 41 U/L - 16 21  ALT 0 - 44 U/L - 11 12   Hepatitis B core Antibody - reactive Hepatitis B surface antibody - reactive Hepatitis B surface antigen - non reactive Hepatitis C antibody - 0.2 (negative) INR -  1.5  Urine culture: ->100,000 ESBL producing klebsiella pneumoniae  -Sensitive to ciprofloxacin, imipenem, piperacillin/tazobactam  -Resistant to ampicillin, cefazolin, cefepime, ceftriaxone, gentamicin, nitrofurantoin, trimethoprim/sulfamethoxazole, ampicillin/sulbactam  Blood cultures collected 11/2: No growth to date <24 hours  CT ABDOMEN PELVIS WO CONTRAST  Result Date: 09/20/2020 CLINICAL DATA:  RIGHT lower quadrant pain. Appendectomy September 18, 2020. EXAM: CT ABDOMEN AND PELVIS WITHOUT CONTRAST TECHNIQUE: Multidetector CT imaging of the abdomen and pelvis was performed following the standard protocol without IV contrast. COMPARISON:  None. FINDINGS: Lower chest: Bibasilar atelectasis and effusions. Findings similar comparison exam 2 days prior. Hepatobiliary: Mild gallbladder distension similar to prior. Single dependent 1 cm gallstone. Small fluid surrounds the margin liver similar to prior. Pancreas: Pancreas is normal. No ductal dilatation. No pancreatic inflammation. Spleen: Normal spleen Adrenals/urinary tract: Adrenal glands normal. There is retained contrast within the kidneys from scan 2 days prior suggesting renal insufficiency. There is a double-J ureteral stent on the LEFT. No hydronephrosis. Foley catheter within collapsed bladder. Stomach/Bowel: Stomach, small bowel cecum normal. Post appendectomy. No abnormal fluid collections in the RIGHT lower quadrant. Ascending, transverse and descending colon normal. The bowel is predominantly collapsed. There is stool in the rectum. Vascular/Lymphatic: IVC filter in infrarenal IVC. Several small lymph nodes surround the upper aorta. No pelvic lymphadenopathy. Reproductive: Calcified leiomyoma uterus.  No adnexal abnormality Other: Moderate free fluid along the pericolic gutters. Musculoskeletal: No aggressive osseous lesion. Chronic severe arthropathy of the RIGHT hip joint with erosion of the RIGHT femoral head. LEFT hip prosthetic. No acute  findings. IMPRESSION: 1. No clear complication following appendectomy. No fluid collection in the RIGHT lower quadrant. 2. Moderate volume of intraperitoneal free fluid similar to comparison exam. 3. Mild distension of gallbladder could related to fasting state. Several gallstones noted. Consider gallbladder ultrasound for further evaluation. 4. Moderate bilateral pleural effusions and passive atelectasis similar prior. 5. Retention of IV contrast within the kidneys from CT scan 2 days prior. Findings suggest nephro toxicity/renal insufficiency. Electronically Signed   By: Suzy Bouchard M.D.   On: 09/20/2020 17:19   DG Swallowing Func-Speech Pathology  Result Date: 09/21/2020 Objective Swallowing Evaluation: Type of Study: Bedside Swallow Evaluation  Patient Details Name: Lylla Eifler MRN: [id number] Date of Birth: September 16, 1935 Today's Date: 09/21/2020 Time: SLP Start Time (ACUTE ONLY): 1330 -SLP Stop Time (ACUTE ONLY): 1350 SLP Time Calculation (min) (ACUTE ONLY): 20 min Past Medical History: Past Medical History: Diagnosis Date . CKD (chronic kidney disease)  . Complication of anesthesia   PER FAMILY PATIENT HAD A SEIZURE WHEN " THEY PUT A ROD IN HER LEG " . Diabetes mellitus without complication [facility])  Past Surgical History: Past  Surgical History: Procedure Laterality Date . APPENDECTOMY  09/18/2020 . CYSTOSCOPY W/ URETERAL STENT PLACEMENT Left 08/18/2020  Procedure: CYSTOSCOPY WITH RETROGRADE PYELOGRAM/URETERAL STENT PLACEMENT;  Surgeon: [name], MD;  Location: [place];  Service: Urology;  Laterality: Left; . IR FLUORO GUIDE CV LINE RIGHT  08/23/2020 . IR REMOVAL TUN CV CATH W/O FL  08/31/2020 . IR US GUIDE VASC ACCESS RIGHT  08/23/2020 . LAPAROSCOPIC APPENDECTOMY N/A 09/18/2020  Procedure: APPENDECTOMY LAPAROSCOPIC;  Surgeon: Coralie Keens, MD;  Location: [place];  Service: General;  Laterality: N/A; . TOTAL HIP ARTHROPLASTY Right 03/05/2020  Procedure: HIP GIRDLE STONE;  Surgeon: Leandrew Koyanagi, MD;   Location: [facility];  Service: Orthopedics;  Laterality: Right; HPI: Ms.Fambro is a 85 yo F w/ PMH of IDDm, CKD3, Hypoalbuminemia, Chronic R hip fracture and recent admission for pyelonephritis with ESBL kleb bacteremia presenting to [facility] with abdominal pain due to appendicitis. Seen by SLP in 10/21: recommended dys 1 diet with thin liquids 2/2 instances of coughing and multiple swallows. SLP s/o as pt appeared to be at baseline functioning. CT abdomen: Lungs: Bibasilar atelectasis and effusions.  Subjective: pt very uncomfortable in bed Assessment / Plan / Recommendation CHL IP CLINICAL IMPRESSIONS 09/21/2020 Clinical Impression Pt presents with pharyngeal dysphagia characterized by a pharyngeal delay which resulted in penetration (PAS 3,5) with thin liquids and penetration (PAS 2) with nectar thick liquids. Penetration of thin liquids inconsistently resulted in silent trace aspiration (PAS 8). Depth of penetration was improved with use of smaller bolus sizes but ultimate aspiration was still inconsistently demonstrated. It is noteworthy that the study was conducted under optimal conditions with the pt sitting fully upright. However, considering the pt and her daughter's reports of pt having p.o. intake in a reclined position, her risk of aspiration would be notably increased. It is anticipated that quantity and frequency of aspiration increases with pt in a reclined position. Mastication was prolonged with regular textures but this did not appear to significantly impact safety. Esophageal screening revealed retention of barium in the mid to lower thoracic esophagus; determination of the etiology of this is beyond the scope of this study; however, pt's daughter did report the pt having esophageal-related issues since birth. A regular texture diet with nectar thick liquids is recommended at this time. SLP will follow for dysphagia treatment.  SLP Visit Diagnosis Dysphagia, pharyngoesophageal phase (R13.14) Attention and  concentration deficit following -- Frontal lobe and executive function deficit following -- Impact on safety and function Moderate aspiration risk;Mild aspiration risk   CHL IP TREATMENT RECOMMENDATION 09/21/2020 Treatment Recommendations Therapy as outlined in treatment plan below   Prognosis 09/21/2020 Prognosis for Safe Diet Advancement Good Barriers to Reach Goals Other (Comment);Time post onset Barriers/Prognosis Comment -- CHL IP DIET RECOMMENDATION 09/21/2020 SLP Diet Recommendations Regular solids;Nectar thick liquid Liquid Administration via Cup;Straw Medication Administration Whole meds with puree Compensations Minimize environmental distractions;Slow rate;Small sips/bites Postural Changes Seated upright at 90 degrees   CHL IP OTHER RECOMMENDATIONS 09/21/2020 Recommended Consults -- Oral Care Recommendations Oral care BID Other Recommendations --   CHL IP FOLLOW UP RECOMMENDATIONS 09/21/2020 Follow up Recommendations Home health SLP   CHL IP FREQUENCY AND DURATION 09/21/2020 Speech Therapy Frequency (ACUTE ONLY) min 2x/week Treatment Duration 2 weeks      CHL IP ORAL PHASE 09/21/2020 Oral Phase WFL Oral - Pudding Teaspoon -- Oral - Pudding Cup -- Oral - Honey Teaspoon -- Oral - Honey Cup -- Oral - Nectar Teaspoon -- Oral - Nectar Cup -- Oral -  Nectar Straw -- Oral - Thin Teaspoon -- Oral - Thin Cup -- Oral - Thin Straw -- Oral - Puree -- Oral - Mech Soft -- Oral - Regular -- Oral - Multi-Consistency -- Oral - Pill -- Oral Phase - Comment --  CHL IP PHARYNGEAL PHASE 09/21/2020 Pharyngeal Phase Impaired Pharyngeal- Pudding Teaspoon -- Pharyngeal -- Pharyngeal- Pudding Cup -- Pharyngeal -- Pharyngeal- Honey Teaspoon -- Pharyngeal -- Pharyngeal- Honey Cup -- Pharyngeal -- Pharyngeal- Nectar Teaspoon -- Pharyngeal -- Pharyngeal- Nectar Cup Delayed swallow initiation-vallecula;Penetration/Aspiration during swallow Pharyngeal Material enters airway, remains ABOVE vocal cords then ejected out Pharyngeal- Nectar  Straw Delayed swallow initiation-vallecula;Penetration/Aspiration during swallow Pharyngeal Material enters airway, remains ABOVE vocal cords then ejected out Pharyngeal- Thin Teaspoon -- Pharyngeal -- Pharyngeal- Thin Cup Delayed swallow initiation-vallecula;Penetration/Aspiration during swallow Pharyngeal Material enters airway, remains ABOVE vocal cords and not ejected out;Material enters airway, passes BELOW cords without attempt by patient to eject out (silent aspiration) Pharyngeal- Thin Straw Delayed swallow initiation-vallecula;Penetration/Aspiration during swallow Pharyngeal Material enters airway, remains ABOVE vocal cords and not ejected out;Material enters airway, CONTACTS cords and not ejected out;Material enters airway, passes BELOW cords without attempt by patient to eject out (silent aspiration) Pharyngeal- Puree Delayed swallow initiation-vallecula Pharyngeal -- Pharyngeal- Mechanical Soft -- Pharyngeal -- Pharyngeal- Regular Delayed swallow initiation-vallecula Pharyngeal -- Pharyngeal- Multi-consistency Delayed swallow initiation-vallecula Pharyngeal -- Pharyngeal- Pill Delayed swallow initiation-vallecula;Penetration/Aspiration during swallow  Pharyngeal -- Pharyngeal Comment --  CHL IP CERVICAL ESOPHAGEAL PHASE 09/21/2020 Cervical Esophageal Phase Impaired Pudding Teaspoon -- Pudding Cup -- Honey Teaspoon -- Honey Cup -- Nectar Teaspoon -- Nectar Cup -- Nectar Straw -- Thin Teaspoon -- Thin Cup -- Thin Straw (No Data); See impressions Puree -- Mechanical Soft -- Regular -- Multi-consistency -- Pill -- Cervical Esophageal Comment -- Tobie Poet I. Hardin Negus, [place], [place] Office number [phone] Pager [phone] Horton Marshall 09/21/2020, 2:27 PM              US Abdomen Limited RUQ (LIVER/GB)  Result Date: 09/21/2020 CLINICAL DATA:  Right upper quadrant pain EXAM: ULTRASOUND ABDOMEN LIMITED RIGHT UPPER QUADRANT COMPARISON:  CT 1 day prior FINDINGS:  Gallbladder: There is cholelithiasis without evidence for gallbladder wall thickening. There is gallbladder sludge. The sonographic Percell Miller sign is reported as positive. Common bile duct: Diameter: 3 mm Liver: The liver is heterogeneous with a nodular contour. Portal vein is patent on color Doppler imaging with normal direction of blood flow towards the liver. Other: There is a right-sided pleural effusion. There is a small volume of ascites. IMPRESSION: 1. Findings are equivocal for acute calculus cholecystitis. While the sonographic Percell Miller sign is positive in the presence of gallstones, there is no gallbladder wall thickening. 2. Cirrhotic appearing liver. 3. Small volume ascites. 4. Incidentally noted right-sided pleural effusion. Electronically Signed   By: Constance Holster M.D.   On: 09/21/2020 19:49   Assessment/Plan:  Active Problems:   Protein-calorie malnutrition, severe (HCC)   Appendicitis   S/P appendectomy  Ms. Fleeta Kunde is a 85 year old female with past medical history of IDDM, CKD3, hypoalbuminemia, chronic right hip fracture, recent admission for emphysematous cystitis and left-sided emphysematous pyelitis with ESBL klebsiella bacteremia who presented to [facility] on 11/08 with right lower quadrant abdominal pain found to have acute appendicitis s/p appendectomy on 11/08 with hospital course complicated by continued ESBL producing klebsiella pneumoniae urinary tract infection despite treatment with meropenem.  #ESBL producing Klebsiella Pneumoniae UTI, active Following patient's appendectomy, patient continued to endorse suprapubic abdominal pain on examination. Uinalysis obtained  was concerning for urinary tract infection with follow-up urine cultures showing continued growth >100,000 ESBL producing Klebsiella Pneumoniae sensitive to imipenem, ciprofloxacin, and piperacillin/tazobactam. She was started on meropenem on 11/10 per pharmacy dosing. As patient has continued ESBL producing  Klebsiella Pneumoniae since her prior hospitalization, where she was treated with two-week course of meropenem, infectious disease was consulted for further suggestions and management. Patient's pain, blood pressure, and white blood cell count all improving. -Appreciate ID recommendations  -Continue meropenem 1g IV Q12H for 10 days of total therapy (currently day 4/10)  -Can be changed to Invanz to give once daily in the outpatient setting -Appreciate urology recommendations  -Removal left ureteral stent on 10/12/2020  -Foley to gravity -Daily CBC  #AoCKD 3a, active, improving Patient's creatinine trended up from baseline of 1.3 to 2.12, now 1.96 CT Abdomen/Pelvis on 09/20/20 demonstrating retention of IV contrast from initial CT scan on 11/08 consistent with renal insufficiency/nephro toxicity. AKI likely secondary to injury from contrast, urinary tract infection as well as intravascular volume depletion. -Continue meropenem -Continue IVF until noon today -Monitor creatinine on daily CMP  #Arrythmia, active Patient developed episode of atrial fibrillation with RVR on the evening of 11/11 without subjective complaints. Overnight telemetry revealed findings concerning for second-degree Mobitz type 1 versus type 2 block. EKG obtained which was largely indeterminate, however concern for prolonged PR interval suggesting first-degree heart block. Patient's arrhythmia likely secondary to her multiple ongoing acute and chronic conditions. Due to her overall condition, the risks of anticoagulation at this time outweigh the benefit. -Continue to monitor on telemetry -Check magnesium  #Anasarca, chronic #Hypoalbuminemia, chronic #Cirrhosis, chronic Patient has anasarca in the setting of her profound hypoalbuminemia. During patient's appendectomy, her liver was noticed to have a nodular texture concerning for cirrhosis. LFTs within normal limits, however alkaline phosphatase mildly elevated. INR elevated  to 1.5. Patient with reactive hepatitis B core antibody and surface antibody suggesting prior hepatitis B infection. Patient will require further workup of this condition in the outpatient setting with gastroenterology. -Appreciate ID recommendations:   -Hepatitis B core antibody, IgM  -Hepatitis B DNA, Ultraquantitative PCR  -Hepatitis B e antibody  -Hepatitis B e antigen -Follow-up with gastroenterology in outpatient setting  #Poor oral intake, active For several months, patient has had poor oral intake. RD consulted and provided recommendations. SLP evaluated, performed barium swallow and recommending nectar thick liquids. -Continue LR 75cc/hr until noon today -Boost supplementation TID (although patient repeatedly refusing) -Carb modified diet now, nectar thick liquids  #Appendicitis s/p appendectomy Patient presented with right lower quadrant abdominal pain with evidence of early appendicitis on CT abdomen/pelvis. Patient underwent appendectomy with general surgery on 96/22 without complication. The appendix was noted to be mildly inflamed and sent to pathology for further evaluation. Surgical pathology evaluation confirmed acute appendicitis. Following the operation, patient continued to report waxing and waning mild right-sided abdominal pain. -Acetaminophen 650mg  Q6H PRN -Oxycodone 5mg  Q4H PRN -Morphine 1-4mg  Q4H PRN for breakthrough pain -Zofran 4mg  Q6H PRN  #IDDM, chronic Patient prescribed 5u lantus nightly at home. CBGs well controlled on current regimen. -Continue sensitive SSI -CBGs with SSI before meals and bedtime  #VTE ppx: Lovenox 30mg  daily #Diet: Carb modified, nectar thick #Bowel regimen: Miralax daily PRN #Code status: Full code (appreciate palliative care consultation and support) #PT/OT recs: No PT/OT follow up; supervision/assistance - 24 hour  Cato Mulligan, MD 09/23/2020, 11:19 AM Pager: [phone] After 5pm on weekdays and 1pm on weekends: On Call  pager [phone]

## 2020-09-24 DIAGNOSIS — Z7189 Other specified counseling: Secondary | ICD-10-CM | POA: Diagnosis not present

## 2020-09-24 DIAGNOSIS — Z515 Encounter for palliative care: Secondary | ICD-10-CM | POA: Diagnosis not present

## 2020-09-24 LAB — CBC
HCT: 25.3 % — ABNORMAL LOW (ref 36.0–46.0)
Hemoglobin: 7.7 g/dL — ABNORMAL LOW (ref 12.0–15.0)
MCH: 29.5 pg (ref 26.0–34.0)
MCHC: 30.4 g/dL (ref 30.0–36.0)
MCV: 96.9 fL (ref 80.0–100.0)
Platelets: 109 10*3/uL — ABNORMAL LOW (ref 150–400)
RBC: 2.61 MIL/uL — ABNORMAL LOW (ref 3.87–5.11)
RDW: 14.9 % (ref 11.5–15.5)
WBC: 4.8 10*3/uL (ref 4.0–10.5)
nRBC: 0 % (ref 0.0–0.2)

## 2020-09-24 LAB — GLUCOSE, CAPILLARY
Glucose-Capillary: 211 mg/dL — ABNORMAL HIGH (ref 70–99)
Glucose-Capillary: 238 mg/dL — ABNORMAL HIGH (ref 70–99)
Glucose-Capillary: 273 mg/dL — ABNORMAL HIGH (ref 70–99)
Glucose-Capillary: 225 mg/dL — ABNORMAL HIGH (ref 70–99)

## 2020-09-24 LAB — BASIC METABOLIC PANEL
Anion gap: 5 (ref 5–15)
BUN: 29 mg/dL — ABNORMAL HIGH (ref 8–23)
CO2: 25 mmol/L (ref 22–32)
Calcium: 7.5 mg/dL — ABNORMAL LOW (ref 8.9–10.3)
Chloride: 113 mmol/L — ABNORMAL HIGH (ref 98–111)
Creatinine, Ser: 1.88 mg/dL — ABNORMAL HIGH (ref 0.44–1.00)
GFR, Estimated: 26 mL/min — ABNORMAL LOW (ref >60)
Glucose, Bld: 249 mg/dL — ABNORMAL HIGH (ref 70–99)
Potassium: 4.9 mmol/L (ref 3.5–5.1)
Sodium: 143 mmol/L (ref 135–145)

## 2020-09-24 LAB — HEPATITIS B DNA, ULTRAQUANTITATIVE, PCR
HBV DNA SERPL PCR-ACNC: NOT DETECTED IU/mL
HBV DNA SERPL PCR-LOG IU: UNDETERMINED log10 IU/mL

## 2020-09-24 LAB — HEPATITIS B E ANTIGEN: Hep B E Ag: NEGATIVE

## 2020-09-24 MED ORDER — HYDROCORTISONE 1 % EX CREA
TOPICAL_CREAM | Freq: Two times a day (BID) | CUTANEOUS | Status: DC
  Administered 2020-09-29: 1 via TOPICAL
  Filled 2020-09-24: qty 28

## 2020-09-24 MED ORDER — ALBUMIN HUMAN 25 % IV SOLN
25.0000 g | Freq: Once | INTRAVENOUS | Status: AC
  Administered 2020-09-24: 25 g via INTRAVENOUS
  Filled 2020-09-24: qty 100

## 2020-09-24 MED ORDER — LACTATED RINGERS IV SOLN: INTRAVENOUS | Status: DC

## 2020-09-24 NOTE — Progress Notes (Signed)
Occupational Therapy Note  OT re-consulted 09/23/2020.  Upon review of most recent OT evaluation on 11/9, and notes from previous admissions.  Pt has had long term debility and is bedbound relying on assist from family and caregivers for all aspects of ADLs.  Previous attempts at working with pt during previous admissions were very limited due to limited participation due to pain, refusals, and lethargy.  Given her long term debility no acute OT is indicated at this time, and no changes/concerns were noted at time of eval on 11/9.  OT will sign off at this time.   Nilsa Nutting., OTR/L Acute Rehabilitation Services Pager [phone] Office [phone]

## 2020-09-24 NOTE — Progress Notes (Signed)
7 Days Post-Op Subjective: Pain controlled. No nausea or emesis.  Objective: Vital signs in last 24 hours: Temp:  [97.7 F (36.5 C)-97.9 F (36.6 C)] 97.7 F (36.5 C) (11/15 0600) Pulse Rate:  [91-95] 93 (11/15 0600) Resp:  [16-18] 18 (11/15 0600) BP: (101-131)/(46-69) 131/51 (11/15 0600) SpO2:  [98 %-100 %] 100 % (11/15 0600)  Intake/Output from previous day: 11/14 0701 - 11/15 0700 In: 235.2 [I.V.:235.2] Out: 150 [Urine:150] Intake/Output this shift: No intake/output data recorded.   UOP: only 127ml recorded  Physical Exam:  General: Alert and oriented CV: RRR Lungs: Clear Abdomen: Soft, ND, NT Ext: NT, No erythema  Lab Results: Recent Labs    09/23/20 0227 09/24/20 0312  HGB 7.5* 7.7*  HCT 24.4* 25.3*   BMET Recent Labs    09/23/20 0227 09/24/20 0312  NA 142 143  K 4.1 4.9  CL 111 113*  CO2 25 25  GLUCOSE 175* 249*  BUN 26* 29*  CREATININE 1.96* 1.88*  CALCIUM 7.5* 7.5*     Studies/Results: No results found.  Assessment/Plan: 1. History of emphysematous cystitis and emphysematous left pyelitis:Seen on CT A/P 08/17/2020. S/p cystoscopy, left ureteral stent placement on 08/18/2020. CT A/P 08/20/2020 with decompressed left renal collecting system with no gas in the collecting system and bladder decompressed around a Foley catheter with no further gas within the bladder. Received prolonged course of antibiotics and ultimately discharged home. Represented with ESWL Klebsiella. CT A/P 09/20/2020 revealed left ureteral stent placement in appropriate position with no hydronephrosis and collapsed bladder around a Foley catheter. 2. ESWL Klebsiella UTI: Urine culture 09/20/2020 with greater than 100,000 Klebsiella ESBL. On Merrem. 3. AKI on CKD 3:Creatinine 2.1 from baseline of 1.3.CT A/P 11/10 with stent in appropriate position with no hydronephrosis. 4. History of type 2 diabetes  -Leave left ureteral stent in place given active infection. Would not remove  during active infection. We will plan for removal in the office when her infection subsides. She has follow-up with [facility] on 10/12/2020. -Continue broad-spectrum antibiotics with meropenem per ID -Reviewed CT A/P 11/10 with no hydronephrosis and stent in appropriate position. -Foley to gravity, stent to remain in place while infection present   LOS: 7 days   Whitney [name] 09/25/2020, 7:24 AM [name]  Pager: [phone]

## 2020-09-24 NOTE — Progress Notes (Signed)
Subjective:  [name] is a 85 year old female with past medical history of IDDM, CKD3, hypoalbuminemia, chronic right hip fracture, recent admission for emphysematous cystitis and left-sided emphysematous pyelitis with ESBL klebsiella bacteremia who presented to [facility] on 11/08 with right lower quadrant abdominal pain found to have acute appendicitis s/p appendectomy on 11/08 with hospital course complicated by continued ESBL producing klebsiella pneumoniae urinary tract infection despite previously completed treatment course with meropenem.  Overnight, no acute events.  This morning, the Internal Medicine Teaching Service team spoke with [name] with the use of an Arabic Stratus Interpreter without the presence of her daughter. Patient reported very clearly to our team that she wants [place] to "let her die naturally" and that she does not desire further aggressive treatment or escalation of medical or surgical therapy. She stated that she has not yet expressed these feelings to her daughter, however her daughter will be here shortly. She plans to discuss her feelings regarding end of life care with her daughter today. Additionally, patient endorses pain in her legs with her right leg feeling worse than her left. She states that her abdominal pain is resolving and only endorses minimal pain around one of her sites of incision from her recent appendectomy and minimal pain in her suprapubic region if pressed. She was surprised to learn that she has hemorrhoids, as she has never had hemorrhoids in the past and denies pain with bowel movements. She also inquires about why she cannot drink water. We explained to the patient that she is at high risk for aspiration and will need thickened fluids at this time. She understands and agrees with our plan for the day. She looks forward to continuing to work with palliative care team for support. She has no further questions for the medical team and repeatedly  expresses her gratitude.  Objective:  Vital signs in last 24 hours: Vitals:   09/23/20 1543 09/23/20 1935 09/24/20 0000 09/24/20 0557  BP: (!) 113/41 (!) 115/46 (!) 124/50 (!) 109/42  Pulse: 82 82 79 82  Resp: 19 18 17 17   Temp:  97.9 F (36.6 C) 97.8 F (36.6 C) 97.7 F (36.5 C)  TempSrc:  Axillary Axillary Tympanic  SpO2: 100% 100% 100% 100%  Weight:      Height:      SpO2: 100 % O2 Flow Rate (L/min): 2 L/min  Filed Weights   09/18/20 1158 09/21/20 0500  Weight: 90 kg 82.1 kg    Intake/Output Summary (Last 24 hours) at 09/24/2020 1158 Last data filed at 09/23/2020 1935 Gross per 24 hour  Intake 750.06 ml  Output 400 ml  Net 350.06 ml   Physical Exam Vitals and nursing note reviewed. Exam conducted with a chaperone present.  Constitutional:      General: She is not in acute distress.    Appearance: She is ill-appearing.  HENT:     Head: Normocephalic and atraumatic.  Cardiovascular:     Rate and Rhythm: Normal rate and regular rhythm.     Heart sounds: Normal heart sounds.  Pulmonary:     Effort: Pulmonary effort is normal. No respiratory distress.     Breath sounds: Normal breath sounds.  Abdominal:     General: Abdomen is flat. Bowel sounds are normal. There is no distension.     Palpations: Abdomen is soft.     Tenderness: There is abdominal tenderness in the suprapubic area.     Comments: Surgical incisions clean, dry with no drainage  Musculoskeletal:     Right lower leg: 2+ Pitting Edema present.     Left lower leg: 2+ Pitting Edema present.  Skin:    General: Skin is warm and dry.     Capillary Refill: Capillary refill takes less than 2 seconds.  Neurological:     General: No focal deficit present.     Mental Status: She is alert and oriented to person, place, and time.  Psychiatric:        Mood and Affect: Mood normal.        Behavior: Behavior normal.    CBC Latest Ref Rng & Units 09/24/2020 09/23/2020 09/22/2020  WBC 4.0 - 10.5 K/uL 4.8 7.6  10.2  Hemoglobin 12.0 - 15.0 g/dL 7.7(L) 7.5(L) 7.7(L)  Hematocrit 36 - 46 % 25.3(L) 24.4(L) 25.1(L)  Platelets 150 - 400 K/uL 109(L) 107(L) 112(L)   CMP Latest Ref Rng & Units 09/24/2020 09/23/2020 09/22/2020  Glucose 70 - 99 mg/dL 249(H) 175(H) 218(H)  BUN 8 - 23 mg/dL 29(H) 26(H) 26(H)  Creatinine 0.44 - 1.00 mg/dL 1.88(H) 1.96(H) 2.12(H)  Sodium 135 - 145 mmol/L 143 142 140  Potassium 3.5 - 5.1 mmol/L 4.9 4.1 3.9  Chloride 98 - 111 mmol/L 113(H) 111 109  CO2 22 - 32 mmol/L 25 25 24   Calcium 8.9 - 10.3 mg/dL 7.5(L) 7.5(L) 7.2(L)  Total Protein 6.5 - 8.1 g/dL - - 4.8(L)  Total Bilirubin 0.3 - 1.2 mg/dL - - 0.8  Alkaline Phos 38 - 126 U/L - - 114  AST 15 - 41 U/L - - 16  ALT 0 - 44 U/L - - 11   Hepatitis B core Antibody - reactive Hepatitis B surface antibody - reactive Hepatitis B surface antigen - non reactive Hepatitis C antibody - 0.2 (negative) INR - 1.5  Urine culture: ->100,000 ESBL producing klebsiella pneumoniae  -Sensitive to ciprofloxacin, imipenem, piperacillin/tazobactam  -Resistant to ampicillin, cefazolin, cefepime, ceftriaxone, gentamicin, nitrofurantoin, trimethoprim/sulfamethoxazole, ampicillin/sulbactam  Blood cultures collected 11/2: No growth for two days  CT ABDOMEN PELVIS WO CONTRAST  Result Date: 09/20/2020 CLINICAL DATA:  RIGHT lower quadrant pain. Appendectomy September 18, 2020. EXAM: CT ABDOMEN AND PELVIS WITHOUT CONTRAST TECHNIQUE: Multidetector CT imaging of the abdomen and pelvis was performed following the standard protocol without IV contrast. COMPARISON:  None. FINDINGS: Lower chest: Bibasilar atelectasis and effusions. Findings similar comparison exam 2 days prior. Hepatobiliary: Mild gallbladder distension similar to prior. Single dependent 1 cm gallstone. Small fluid surrounds the margin liver similar to prior. Pancreas: Pancreas is normal. No ductal dilatation. No pancreatic inflammation. Spleen: Normal spleen Adrenals/urinary tract: Adrenal  glands normal. There is retained contrast within the kidneys from scan 2 days prior suggesting renal insufficiency. There is a double-J ureteral stent on the LEFT. No hydronephrosis. Foley catheter within collapsed bladder. Stomach/Bowel: Stomach, small bowel cecum normal. Post appendectomy. No abnormal fluid collections in the RIGHT lower quadrant. Ascending, transverse and descending colon normal. The bowel is predominantly collapsed. There is stool in the rectum. Vascular/Lymphatic: IVC filter in infrarenal IVC. Several small lymph nodes surround the upper aorta. No pelvic lymphadenopathy. Reproductive: Calcified leiomyoma uterus.  No adnexal abnormality Other: Moderate free fluid along the pericolic gutters. Musculoskeletal: No aggressive osseous lesion. Chronic severe arthropathy of the RIGHT hip joint with erosion of the RIGHT femoral head. LEFT hip prosthetic. No acute findings. IMPRESSION: 1. No clear complication following appendectomy. No fluid collection in the RIGHT lower quadrant. 2. Moderate volume of intraperitoneal free fluid similar to comparison exam. 3. Mild distension of  gallbladder could related to fasting state. Several gallstones noted. Consider gallbladder ultrasound for further evaluation. 4. Moderate bilateral pleural effusions and passive atelectasis similar prior. 5. Retention of IV contrast within the kidneys from CT scan 2 days prior. Findings suggest nephro toxicity/renal insufficiency. Electronically Signed   By: Suzy Bouchard M.D.   On: 09/20/2020 17:19   DG Swallowing Func-Speech Pathology  Result Date: 09/21/2020 Objective Swallowing Evaluation: Type of Study: Bedside Swallow Evaluation  Patient Details Name: [name] MRN: [id number] Date of Birth: 09/09/1935 Today's Date: 09/21/2020 Time: SLP Start Time (ACUTE ONLY): 1330 -SLP Stop Time (ACUTE ONLY): 1350 SLP Time Calculation (min) (ACUTE ONLY): 20 min Past Medical History: Past Medical History: Diagnosis Date . CKD  (chronic kidney disease)  . Complication of anesthesia   PER FAMILY PATIENT HAD A SEIZURE WHEN " THEY PUT A ROD IN HER LEG " . Diabetes mellitus without complication [facility])  Past Surgical History: Past Surgical History: Procedure Laterality Date . APPENDECTOMY  09/18/2020 . CYSTOSCOPY W/ URETERAL STENT PLACEMENT Left 08/18/2020  Procedure: CYSTOSCOPY WITH RETROGRADE PYELOGRAM/URETERAL STENT PLACEMENT;  Surgeon: [name], MD;  Location: [name];  Service: Urology;  Laterality: Left; . IR FLUORO GUIDE CV LINE RIGHT  08/23/2020 . IR REMOVAL TUN CV CATH W/O FL  08/31/2020 . IR US GUIDE VASC ACCESS RIGHT  08/23/2020 . LAPAROSCOPIC APPENDECTOMY N/A 09/18/2020  Procedure: APPENDECTOMY LAPAROSCOPIC;  Surgeon: Coralie Keens, MD;  Location: [place];  Service: General;  Laterality: N/A; . TOTAL HIP ARTHROPLASTY Right 03/05/2020  Procedure: HIP GIRDLE STONE;  Surgeon: Leandrew Koyanagi, MD;  Location: [place];  Service: Orthopedics;  Laterality: Right; HPI: WhitneyKaser is a 85 yo F w/ PMH of IDDm, CKD3, Hypoalbuminemia, Chronic R hip fracture and recent admission for pyelonephritis with ESBL kleb bacteremia presenting to [facility] with abdominal pain due to appendicitis. Seen by SLP in 10/21: recommended dys 1 diet with thin liquids 2/2 instances of coughing and multiple swallows. SLP s/o as pt appeared to be at baseline functioning. CT abdomen: Lungs: Bibasilar atelectasis and effusions.  Subjective: pt very uncomfortable in bed Assessment / Plan / Recommendation CHL IP CLINICAL IMPRESSIONS 09/21/2020 Clinical Impression Pt presents with pharyngeal dysphagia characterized by a pharyngeal delay which resulted in penetration (PAS 3,5) with thin liquids and penetration (PAS 2) with nectar thick liquids. Penetration of thin liquids inconsistently resulted in silent trace aspiration (PAS 8). Depth of penetration was improved with use of smaller bolus sizes but ultimate aspiration was still inconsistently demonstrated. It is noteworthy that the  study was conducted under optimal conditions with the pt sitting fully upright. However, considering the pt and her daughter's reports of pt having p.o. intake in a reclined position, her risk of aspiration would be notably increased. It is anticipated that quantity and frequency of aspiration increases with pt in a reclined position. Mastication was prolonged with regular textures but this did not appear to significantly impact safety. Esophageal screening revealed retention of barium in the mid to lower thoracic esophagus; determination of the etiology of this is beyond the scope of this study; however, pt's daughter did report the pt having esophageal-related issues since birth. A regular texture diet with nectar thick liquids is recommended at this time. SLP will follow for dysphagia treatment.  SLP Visit Diagnosis Dysphagia, pharyngoesophageal phase (R13.14) Attention and concentration deficit following -- Frontal lobe and executive function deficit following -- Impact on safety and function Moderate aspiration risk;Mild aspiration risk   CHL IP TREATMENT RECOMMENDATION 09/21/2020 Treatment Recommendations  Therapy as outlined in treatment plan below   Prognosis 09/21/2020 Prognosis for Safe Diet Advancement Good Barriers to Reach Goals Other (Comment);Time post onset Barriers/Prognosis Comment -- CHL IP DIET RECOMMENDATION 09/21/2020 SLP Diet Recommendations Regular solids;Nectar thick liquid Liquid Administration via Cup;Straw Medication Administration Whole meds with puree Compensations Minimize environmental distractions;Slow rate;Small sips/bites Postural Changes Seated upright at 90 degrees   CHL IP OTHER RECOMMENDATIONS 09/21/2020 Recommended Consults -- Oral Care Recommendations Oral care BID Other Recommendations --   CHL IP FOLLOW UP RECOMMENDATIONS 09/21/2020 Follow up Recommendations Home health SLP   CHL IP FREQUENCY AND DURATION 09/21/2020 Speech Therapy Frequency (ACUTE ONLY) min 2x/week Treatment  Duration 2 weeks      CHL IP ORAL PHASE 09/21/2020 Oral Phase WFL Oral - Pudding Teaspoon -- Oral - Pudding Cup -- Oral - Honey Teaspoon -- Oral - Honey Cup -- Oral - Nectar Teaspoon -- Oral - Nectar Cup -- Oral - Nectar Straw -- Oral - Thin Teaspoon -- Oral - Thin Cup -- Oral - Thin Straw -- Oral - Puree -- Oral - Mech Soft -- Oral - Regular -- Oral - Multi-Consistency -- Oral - Pill -- Oral Phase - Comment --  CHL IP PHARYNGEAL PHASE 09/21/2020 Pharyngeal Phase Impaired Pharyngeal- Pudding Teaspoon -- Pharyngeal -- Pharyngeal- Pudding Cup -- Pharyngeal -- Pharyngeal- Honey Teaspoon -- Pharyngeal -- Pharyngeal- Honey Cup -- Pharyngeal -- Pharyngeal- Nectar Teaspoon -- Pharyngeal -- Pharyngeal- Nectar Cup Delayed swallow initiation-vallecula;Penetration/Aspiration during swallow Pharyngeal Material enters airway, remains ABOVE vocal cords then ejected out Pharyngeal- Nectar Straw Delayed swallow initiation-vallecula;Penetration/Aspiration during swallow Pharyngeal Material enters airway, remains ABOVE vocal cords then ejected out Pharyngeal- Thin Teaspoon -- Pharyngeal -- Pharyngeal- Thin Cup Delayed swallow initiation-vallecula;Penetration/Aspiration during swallow Pharyngeal Material enters airway, remains ABOVE vocal cords and not ejected out;Material enters airway, passes BELOW cords without attempt by patient to eject out (silent aspiration) Pharyngeal- Thin Straw Delayed swallow initiation-vallecula;Penetration/Aspiration during swallow Pharyngeal Material enters airway, remains ABOVE vocal cords and not ejected out;Material enters airway, CONTACTS cords and not ejected out;Material enters airway, passes BELOW cords without attempt by patient to eject out (silent aspiration) Pharyngeal- Puree Delayed swallow initiation-vallecula Pharyngeal -- Pharyngeal- Mechanical Soft -- Pharyngeal -- Pharyngeal- Regular Delayed swallow initiation-vallecula Pharyngeal -- Pharyngeal- Multi-consistency Delayed swallow  initiation-vallecula Pharyngeal -- Pharyngeal- Pill Delayed swallow initiation-vallecula;Penetration/Aspiration during swallow  Pharyngeal -- Pharyngeal Comment --  CHL IP CERVICAL ESOPHAGEAL PHASE 09/21/2020 Cervical Esophageal Phase Impaired Pudding Teaspoon -- Pudding Cup -- Honey Teaspoon -- Honey Cup -- Nectar Teaspoon -- Nectar Cup -- Nectar Straw -- Thin Teaspoon -- Thin Cup -- Thin Straw (No Data); See impressions Puree -- Mechanical Soft -- Regular -- Multi-consistency -- Pill -- Cervical Esophageal Comment -- Tobie Poet I. Hardin Negus, [place], [place] Office number [phone] Pager [phone] Horton Marshall 09/21/2020, 2:27 PM              US Abdomen Limited RUQ (LIVER/GB)  Result Date: 09/21/2020 CLINICAL DATA:  Right upper quadrant pain EXAM: ULTRASOUND ABDOMEN LIMITED RIGHT UPPER QUADRANT COMPARISON:  CT 1 day prior FINDINGS: Gallbladder: There is cholelithiasis without evidence for gallbladder wall thickening. There is gallbladder sludge. The sonographic Percell Miller sign is reported as positive. Common bile duct: Diameter: 3 mm Liver: The liver is heterogeneous with a nodular contour. Portal vein is patent on color Doppler imaging with normal direction of blood flow towards the liver. Other: There is a right-sided pleural effusion. There is a small volume of ascites. IMPRESSION: 1. Findings are equivocal for  acute calculus cholecystitis. While the sonographic Percell Miller sign is positive in the presence of gallstones, there is no gallbladder wall thickening. 2. Cirrhotic appearing liver. 3. Small volume ascites. 4. Incidentally noted right-sided pleural effusion. Electronically Signed   By: Constance Holster M.D.   On: 09/21/2020 19:49   Assessment/Plan:  Active Problems:   Protein-calorie malnutrition, severe ([place])   DNR (do not resuscitate) discussion   Appendicitis   S/P appendectomy  [name] is a 85 year old female with past medical history of IDDM, CKD3,  hypoalbuminemia, chronic right hip fracture, recent admission for emphysematous cystitis and left-sided emphysematous pyelitis with ESBL klebsiella bacteremia who presented to [facility] on 11/08 with right lower quadrant abdominal pain found to have acute appendicitis s/p appendectomy on 11/08 with hospital course complicated by continued ESBL producing klebsiella pneumoniae urinary tract infection despite previously completed treatment course with meropenem.  #ESBL producing Klebsiella Pneumoniae UTI, active Following patient's appendectomy, patient continued to endorse suprapubic abdominal pain on examination. Urinalysis obtained was concerning for urinary tract infection with follow-up urine cultures showing continued growth >100,000 ESBL producing Klebsiella Pneumoniae sensitive to imipenem, ciprofloxacin, and piperacillin/tazobactam. She was started on meropenem on 11/10 per pharmacy dosing. As patient has continued ESBL producing Klebsiella Pneumoniae since her prior hospitalization, where she was treated with two-week course of meropenem, infectious disease was consulted for further suggestions and management. Patient's pain and white blood cell count improving, and blood pressure stabilizing with support. -Appreciate ID recommendations  -Continue meropenem 1g IV Q12H for 10 days of total therapy (currently day 5/10)  -Can be changed to Invanz to give once daily in the outpatient setting -Appreciate urology recommendations  -Removal left ureteral stent on 10/12/2020  -Foley to gravity -Daily CBC  #Goals of care Patient is a chronically-ill, bed-bound, very pleasant, elderly female who lives at home with her daughter who is her sole caregiver. Patient was recently admitted to our service for emphysematous cystitis and emphysematous left pyelitis and underwent urological surgical procedure with stent placement. During prior admission, patient's daughter requested patient to be DNR. Following discharge,  patient reportedly in significant pain at home and often crying throughout the night. Patient's daughter expressed that she wants her mother to be comfortable. Patient returned to our service with change in code status to Full code and underwent appendectomy. Patient continuing hospitalization due to ongoing urinary tract infection with acute kidney injury. Patient expressed to our team directly her desire to die peacefully and not pursue aggressive treatment, however patient's daughter desires aggressive interventions including additional surgical intervention if needed and Full Code. Palliative care team has been consulted, and we truly appreciate their service and recommendations. -Palliative Care team continuing to follow -Continue discussing goals of care with patient and her daughter  #AoCKD 3a, active, improving Patient's creatinine trended up from baseline of 1.3 to 2.12, now trending down 1.96 -> 1.88 most recently. CT Abdomen/Pelvis on 09/20/20 demonstrating retention of IV contrast from initial CT scan on 11/08 consistent with renal insufficiency/nephro toxicity. AKI likely secondary to injury from contrast, urinary tract infection as well as intravascular volume depletion. -Continue meropenem -IVF 50cc/hr -Albumin 25% 25g today -Monitor creatinine on daily BMP  #Arrythmia, active Patient developed episode of atrial fibrillation with RVR on the evening of 11/11 without subjective complaints of palpitations, chest pain, shortness of breath, dizziness or lightheadedness. Telemetry revealed findings concerning for second-degree Mobitz type 1 versus type 2 block. EKG obtained which was largely indeterminate, however concern for prolonged PR interval suggesting first-degree heart  block. Patient's arrhythmia likely secondary to her multiple ongoing acute and chronic conditions. Due to her overall condition, the risks of anticoagulation at this time outweigh the benefit. -Continue to monitor on  telemetry -Check magnesium  #Anasarca, chronic #Hypoalbuminemia, chronic #Cirrhosis, chronic Patient has anasarca in the setting of her profound hypoalbuminemia. During patient's appendectomy, her liver was noticed to have a nodular texture concerning for cirrhosis. LFTs within normal limits, however alkaline phosphatase mildly elevated. INR elevated to 1.5. Patient with reactive hepatitis B core antibody and surface antibody suggesting prior hepatitis B infection. Pending patient's desires, she may benefit from further workup of this condition in the outpatient setting with gastroenterology. -Appreciate ID recommendations:   -Hepatitis B core antibody, IgM  -Hepatitis B DNA, Ultraquantitative PCR  -Hepatitis B e antibody  -Hepatitis B e antigen -Consider follow-up with gastroenterology in outpatient setting  #Poor oral intake, active For several months, patient has had poor oral intake. RD consulted and provided recommendations. SLP evaluated, performed barium swallow and recommending nectar thick liquids. -Continue LR 50cc/hr -Boost supplementation TID (although patient often refusing) -Carb modified diet now, nectar thick liquids  #Appendicitis s/p appendectomy Patient presented with right lower quadrant abdominal pain with evidence of early appendicitis on CT abdomen/pelvis. Patient underwent appendectomy with general surgery on 31/28 without complication. The appendix was noted to be mildly inflamed and sent to pathology for further evaluation. Surgical pathology evaluation confirmed acute appendicitis. Following the operation, patient continued to report waxing and waning mild right-sided abdominal pain. -Acetaminophen 650mg  Q6H PRN -Oxycodone 5mg  Q4H PRN -Morphine 1-4mg  Q4H PRN for breakthrough pain -Zofran 4mg  Q6H PRN  #IDDM, chronic Patient prescribed 5u lantus nightly at home. CBGs well controlled on current regimen. -Continue sensitive SSI -CBGs with SSI before meals and  bedtime  #VTE ppx: Lovenox 30mg  daily #Diet: Carb modified, nectar thick #Bowel regimen: Miralax daily PRN #Code status: Full code (appreciate palliative care consultation and support) #PT/OT recs: No PT/OT follow up; supervision/assistance - 24 hour  Cato Mulligan, MD 09/24/2020, 11:58 AM Pager: [phone] After 5pm on weekdays and 1pm on weekends: On Call pager [phone]

## 2020-09-24 NOTE — Progress Notes (Addendum)
Palliative Medicine Inpatient Follow Up Note  Reason for consult:  Goals of Care "Goals of care, code status"  HPI:  Per intake H&P --> [name] is a 85 year old female with past medical history of IDDM, CKD3, hypoalbuminemia, chronic right hip fracture, recent admission for emphysematous cystitis and left-sided emphysematous pyelitis with ESBL klebsiella bacteremia who presented to [facility] on 11/08 with right lower quadrant abdominal pain found to have acute appendicitis s/p appendectomy on 11/08.  Palliative care had met [name] in May of this year at which time she wanted full scope of treatment unless she was in the community and had a cardiac arrest in which case she opted for do not resuscitate.  Today's Discussion (10/24/2020): Chart reviewed.  I met with [name] at bedside we communicated through the use of [employer] interpretation services.  She was much more alert today and able to converse with myself and with her bedside nurse, Leatrice Jewels.  From a symptom perspective she complains of pain on her backside.  Leatrice Jewels and I were able to perform an integumentary examination and noted that she does have a decubitus ulcer in her lower back just above her sacrum.  We discussed that the wound care team is help managing this.  She is also noted to have some small external hemorrhoids which is likely contributing to some pain.  We talked about starting a low-dose steroidal cream to help with this.  Upon abdominal exam [name] does not endorse any pain unless her right upper and right lower quadrant are deeply palpated.  I did share with [name] that the conversation I had had with her daughter yesterday.  The 1 area which there does seem to be some discrepancy is the question of CODE STATUS.  When I asked the patient if she would wish to have resuscitation and reviewed exactly what that would entail she shared with me that she would not want this.  This was a complicated conversation in that the  interpreter did not speak her native dialect of [place].  Therefore, we will need to rebroach this topic ideally with her daughter present so that we may be able to muster through what the patient herself would truly want.  I do think it is imperative that we get the patient's opinion as she is in her right mind at this point in time to make this decision.  Discussed the importance of continued conversation with family and their  medical providers regarding overall plan of care and treatment options, ensuring decisions are within the context of the patients values and GOCs.  Questions and concerns addressed   Objective Assessment: Vital Signs Vitals:   09/24/20 0000 09/24/20 0557  BP: (!) 124/50 (!) 109/42  Pulse: 79 82  Resp: 17 17  Temp: 97.8 F (36.6 C) 97.7 F (36.5 C)  SpO2: 100% 100%    Intake/Output Summary (Last 24 hours) at 09/24/2020 1207 Last data filed at 09/23/2020 1935 Gross per 24 hour  Intake 750.06 ml  Output 400 ml  Net 350.06 ml   Last Weight  Most recent update: 09/21/2020  5:43 AM   Weight  82.1 kg (181 lb)           Gen:  Frail elderly F in NAD HEENT: dry mucous membranes oral thrush CV: Regular rate and rhythm  PULM: clear to auscultation bilaterally. No wheezes/rales/rhonchi  ABD: (+) tenderness in RUQ/RLQ to deep palpation EXT: 3+ LE edema  Neuro: Alert and oriented to person,  time, place, and situation  SUMMARY OF RECOMMENDATIONS Full Code / Full Scope of Treatment - Strongly recommended DNAR/DNI given mothers frailty expressed that she would likely not survive a major event and in the process we would cause her body tremendous burden.  Would benefit from additional conversations with the patient with a [place] interpreter.  We were unable to find one today through [employer] services though, I am hopeful there will be one on during the weekdays.  I worry that perhaps the whole degree is the patients illness is not being seen by the patients daughter  at this time  [facility] - Appreciate medical social worker to evaluate the financial situation as per consult  Spiritual Support - [place]  We will continue to offer support as able  Code Status/Advance Care Planning: FULL CODE  Symptom Management: Pain, RLQ/RUQ: -Acetaminophen 626m TID ATC -Continue Oxycodone 547mQ4H PRN has not received any in the last 24 hours -Morphine [id number] PRN for breakthrough pain has not received any in the last 24 hours  Oral Thrush: -Nystatin QID  Muscular Deconditioning: -ROM exercises  Severe Protein Calorie Malnutrition: - Dietary involved - Patients daughter encouraged to bring cultural foods  External Hemmorroids: - Preparation H  Time Spent: 60 Greater than 50% of the time was spent in counseling and coordination of care ______________________________________________________________________________________ [facility] Team Cell Phone: [phone] utilize secure chat with additional questions, if there is no response within 30 minutes please call the above phone number  Palliative Medicine Team providers are available by phone from 7am to 7pm daily and can be reached through the team cell phone.  Should this patient require assistance outside of these hours, please call the patient's attending physician.

## 2020-09-24 NOTE — Progress Notes (Signed)
Physical Therapy Note  Patient Details Name: Whitney [name] MRN: [id number] DOB: 1935-04-11   Cancelled Treatment:    Reason Eval/Treat Not Completed: PT screened, no acute PT needs identified, will sign off    Orders received, chart reviewed, discussed order and pt status with OT as well;   Noting that pt's baseline functional status is bedbound with dependence for mobility and ADLs;   Pt is currently at her baseline functional status;   Noted also pain, lethargy, and difficulty participating with previous PT attempts;   Plan is to dc home with Palliative Services to follow;   Worth considering hoyer lift, and possibly a trial of HHPT, if Whitney [name] shows interest in working on mobility and transfers;   Will sign off acutely,   Roney Marion, PT  Acute Rehabilitation Services Pager [phone] Office [phone]    Whitney [name] 09/24/2020, 11:29 AM

## 2020-09-25 ENCOUNTER — Inpatient Hospital Stay (HOSPITAL_COMMUNITY): Payer: 59

## 2020-09-25 ENCOUNTER — Encounter: Payer: 59 | Admitting: Student

## 2020-09-25 LAB — GLUCOSE, CAPILLARY
Glucose-Capillary: 189 mg/dL — ABNORMAL HIGH (ref 70–99)
Glucose-Capillary: 198 mg/dL — ABNORMAL HIGH (ref 70–99)
Glucose-Capillary: 212 mg/dL — ABNORMAL HIGH (ref 70–99)
Glucose-Capillary: 220 mg/dL — ABNORMAL HIGH (ref 70–99)

## 2020-09-25 LAB — HEPATITIS B E ANTIBODY: Hep B E Ab: POSITIVE — AB

## 2020-09-25 LAB — CBC
HCT: 26.4 % — ABNORMAL LOW (ref 36.0–46.0)
Hemoglobin: 8 g/dL — ABNORMAL LOW (ref 12.0–15.0)
MCH: 28.8 pg (ref 26.0–34.0)
MCHC: 30.3 g/dL (ref 30.0–36.0)
MCV: 95 fL (ref 80.0–100.0)
Platelets: 92 10*3/uL — ABNORMAL LOW (ref 150–400)
RBC: 2.78 MIL/uL — ABNORMAL LOW (ref 3.87–5.11)
RDW: 14.9 % (ref 11.5–15.5)
WBC: 6 10*3/uL (ref 4.0–10.5)
nRBC: 0 % (ref 0.0–0.2)

## 2020-09-25 LAB — BASIC METABOLIC PANEL
Anion gap: 10 (ref 5–15)
BUN: 28 mg/dL — ABNORMAL HIGH (ref 8–23)
CO2: 22 mmol/L (ref 22–32)
Calcium: 7.7 mg/dL — ABNORMAL LOW (ref 8.9–10.3)
Chloride: 109 mmol/L (ref 98–111)
Creatinine, Ser: 1.79 mg/dL — ABNORMAL HIGH (ref 0.44–1.00)
GFR, Estimated: 28 mL/min — ABNORMAL LOW (ref >60)
Glucose, Bld: 236 mg/dL — ABNORMAL HIGH (ref 70–99)
Potassium: 5 mmol/L (ref 3.5–5.1)
Sodium: 141 mmol/L (ref 135–145)

## 2020-09-25 MED ORDER — INSULIN ASPART 100 UNIT/ML ~~LOC~~ SOLN
0.0000 [IU] | Freq: Three times a day (TID) | SUBCUTANEOUS | Status: DC
  Administered 2020-09-25: 5 [IU] via SUBCUTANEOUS
  Administered 2020-09-25 – 2020-09-28 (×6): 3 [IU] via SUBCUTANEOUS

## 2020-09-25 MED ORDER — INSULIN ASPART 100 UNIT/ML ~~LOC~~ SOLN
0.0000 [IU] | Freq: Every day | SUBCUTANEOUS | Status: DC
  Administered 2020-09-25: 2 [IU] via SUBCUTANEOUS

## 2020-09-25 NOTE — Progress Notes (Signed)
Subjective:  Whitney [name] is a 85 year old female with past medical history of IDDM, CKD3, hypoalbuminemia, chronic right hip fracture, recent admission for emphysematous cystitis and left-sided emphysematous pyelitis with ESBL klebsiella bacteremia who presented to [facility] on 11/08 with right lower quadrant abdominal pain found to have acute appendicitis s/p appendectomy on 11/08 with hospital course complicated by continued ESBL producing klebsiella pneumoniae urinary tract infection despite previously completed treatment course with meropenem.  Overnight, no acute events.  This morning, patient reports that she feels well with only mild abdominal pain around her site of incision and some pain in her bilateral lower extremities. She reports to our team that she does not desire aggressive treatment moving forward, however she has not yet discussed this with her daughter. She has no further complaints or concerns.  Objective:  Vital signs in last 24 hours: Vitals:   09/24/20 0557 09/24/20 1357 09/24/20 2000 09/25/20 0600  BP: (!) 109/42 (!) 121/46 101/69 (!) 131/51  Pulse: 82 95 91 93  Resp: 17 16 17 18   Temp: 97.7 F (36.5 C) 97.7 F (36.5 C) 97.9 F (36.6 C) 97.7 F (36.5 C)  TempSrc: Tympanic Axillary Axillary Oral  SpO2: 100% 100% 98% 100%  Weight:      Height:      SpO2: 100 % O2 Flow Rate (L/min): 2 L/min  Filed Weights   09/18/20 1158 09/21/20 0500  Weight: 90 kg 82.1 kg    Intake/Output Summary (Last 24 hours) at 09/25/2020 0658 Last data filed at 09/24/2020 1700 Gross per 24 hour  Intake 235.17 ml  Output 150 ml  Net 85.17 ml   Physical Exam Vitals and nursing note reviewed. Exam conducted with a chaperone present.  Constitutional:      General: She is not in acute distress.    Appearance: She is ill-appearing.  HENT:     Head: Normocephalic and atraumatic.  Cardiovascular:     Rate and Rhythm: Normal rate and regular rhythm.     Heart sounds: Normal  heart sounds.  Pulmonary:     Effort: Pulmonary effort is normal. No respiratory distress.     Breath sounds: Normal breath sounds.  Abdominal:     General: Abdomen is flat. Bowel sounds are normal. There is no distension.     Palpations: Abdomen is soft.     Tenderness: There is no abdominal tenderness.     Comments: Surgical incisions clean, dry with no drainage  Musculoskeletal:     Right lower leg: 2+ Pitting Edema present.     Left lower leg: 2+ Pitting Edema present.  Skin:    General: Skin is warm and dry.     Capillary Refill: Capillary refill takes less than 2 seconds.  Neurological:     General: No focal deficit present.     Mental Status: She is alert and oriented to person, place, and time.  Psychiatric:        Mood and Affect: Mood normal.        Behavior: Behavior normal.    CBC Latest Ref Rng & Units 09/24/2020 09/23/2020 09/22/2020  WBC 4.0 - 10.5 K/uL 4.8 7.6 10.2  Hemoglobin 12.0 - 15.0 g/dL 7.7(L) 7.5(L) 7.7(L)  Hematocrit 36 - 46 % 25.3(L) 24.4(L) 25.1(L)  Platelets 150 - 400 K/uL 109(L) 107(L) 112(L)   CMP Latest Ref Rng & Units 09/24/2020 09/23/2020 09/22/2020  Glucose 70 - 99 mg/dL 249(H) 175(H) 218(H)  BUN 8 - 23 mg/dL 29(H) 26(H) 26(H)  Creatinine 0.44 -  1.00 mg/dL 1.88(H) 1.96(H) 2.12(H)  Sodium 135 - 145 mmol/L 143 142 140  Potassium 3.5 - 5.1 mmol/L 4.9 4.1 3.9  Chloride 98 - 111 mmol/L 113(H) 111 109  CO2 22 - 32 mmol/L 25 25 24   Calcium 8.9 - 10.3 mg/dL 7.5(L) 7.5(L) 7.2(L)  Total Protein 6.5 - 8.1 g/dL - - 4.8(L)  Total Bilirubin 0.3 - 1.2 mg/dL - - 0.8  Alkaline Phos 38 - 126 U/L - - 114  AST 15 - 41 U/L - - 16  ALT 0 - 44 U/L - - 11   Urine culture: ->100,000 ESBL producing klebsiella pneumoniae  -Sensitive to ciprofloxacin, imipenem, piperacillin/tazobactam  -Resistant to ampicillin, cefazolin, cefepime, ceftriaxone, gentamicin, nitrofurantoin, trimethoprim/sulfamethoxazole, ampicillin/sulbactam  Blood cultures collected 11/2: No  growth for three days  DG Swallowing Func-Speech Pathology  Result Date: 09/21/2020 Objective Swallowing Evaluation: Type of Study: Bedside Swallow Evaluation  Patient Details Name: Whitney [name] MRN: [id number] Date of Birth: 02-14-1935 Today's Date: 09/21/2020 Time: SLP Start Time (ACUTE ONLY): 1330 -SLP Stop Time (ACUTE ONLY): 1350 SLP Time Calculation (min) (ACUTE ONLY): 20 min Past Medical History: Past Medical History: Diagnosis Date . CKD (chronic kidney disease)  . Complication of anesthesia   PER FAMILY PATIENT HAD A SEIZURE WHEN " THEY PUT A ROD IN HER LEG " . Diabetes mellitus without complication [facility])  Past Surgical History: Past Surgical History: Procedure Laterality Date . APPENDECTOMY  09/18/2020 . CYSTOSCOPY W/ URETERAL STENT PLACEMENT Left 08/18/2020  Procedure: CYSTOSCOPY WITH RETROGRADE PYELOGRAM/URETERAL STENT PLACEMENT;  Surgeon: [name], MD;  Location: [place];  Service: Urology;  Laterality: Left; . IR FLUORO GUIDE CV LINE RIGHT  08/23/2020 . IR REMOVAL TUN CV CATH W/O FL  08/31/2020 . IR US GUIDE VASC ACCESS RIGHT  08/23/2020 . LAPAROSCOPIC APPENDECTOMY N/A 09/18/2020  Procedure: APPENDECTOMY LAPAROSCOPIC;  Surgeon: Coralie Keens, MD;  Location: [place];  Service: General;  Laterality: N/A; . TOTAL HIP ARTHROPLASTY Right 03/05/2020  Procedure: HIP GIRDLE STONE;  Surgeon: Leandrew Koyanagi, MD;  Location: [place];  Service: Orthopedics;  Laterality: Right; HPI: WhitneyKreamer is a 85 yo F w/ PMH of IDDm, CKD3, Hypoalbuminemia, Chronic R hip fracture and recent admission for pyelonephritis with ESBL kleb bacteremia presenting to [facility] with abdominal pain due to appendicitis. Seen by SLP in 10/21: recommended dys 1 diet with thin liquids 2/2 instances of coughing and multiple swallows. SLP s/o as pt appeared to be at baseline functioning. CT abdomen: Lungs: Bibasilar atelectasis and effusions.  Subjective: pt very uncomfortable in bed Assessment / Plan / Recommendation CHL IP CLINICAL IMPRESSIONS  09/21/2020 Clinical Impression Pt presents with pharyngeal dysphagia characterized by a pharyngeal delay which resulted in penetration (PAS 3,5) with thin liquids and penetration (PAS 2) with nectar thick liquids. Penetration of thin liquids inconsistently resulted in silent trace aspiration (PAS 8). Depth of penetration was improved with use of smaller bolus sizes but ultimate aspiration was still inconsistently demonstrated. It is noteworthy that the study was conducted under optimal conditions with the pt sitting fully upright. However, considering the pt and her daughter's reports of pt having p.o. intake in a reclined position, her risk of aspiration would be notably increased. It is anticipated that quantity and frequency of aspiration increases with pt in a reclined position. Mastication was prolonged with regular textures but this did not appear to significantly impact safety. Esophageal screening revealed retention of barium in the mid to lower thoracic esophagus; determination of the etiology of this is beyond the scope  of this study; however, pt's daughter did report the pt having esophageal-related issues since birth. A regular texture diet with nectar thick liquids is recommended at this time. SLP will follow for dysphagia treatment.  SLP Visit Diagnosis Dysphagia, pharyngoesophageal phase (R13.14) Attention and concentration deficit following -- Frontal lobe and executive function deficit following -- Impact on safety and function Moderate aspiration risk;Mild aspiration risk   CHL IP TREATMENT RECOMMENDATION 09/21/2020 Treatment Recommendations Therapy as outlined in treatment plan below   Prognosis 09/21/2020 Prognosis for Safe Diet Advancement Good Barriers to Reach Goals Other (Comment);Time post onset Barriers/Prognosis Comment -- CHL IP DIET RECOMMENDATION 09/21/2020 SLP Diet Recommendations Regular solids;Nectar thick liquid Liquid Administration via Cup;Straw Medication Administration Whole  meds with puree Compensations Minimize environmental distractions;Slow rate;Small sips/bites Postural Changes Seated upright at 90 degrees   CHL IP OTHER RECOMMENDATIONS 09/21/2020 Recommended Consults -- Oral Care Recommendations Oral care BID Other Recommendations --   CHL IP FOLLOW UP RECOMMENDATIONS 09/21/2020 Follow up Recommendations Home health SLP   CHL IP FREQUENCY AND DURATION 09/21/2020 Speech Therapy Frequency (ACUTE ONLY) min 2x/week Treatment Duration 2 weeks      CHL IP ORAL PHASE 09/21/2020 Oral Phase WFL Oral - Pudding Teaspoon -- Oral - Pudding Cup -- Oral - Honey Teaspoon -- Oral - Honey Cup -- Oral - Nectar Teaspoon -- Oral - Nectar Cup -- Oral - Nectar Straw -- Oral - Thin Teaspoon -- Oral - Thin Cup -- Oral - Thin Straw -- Oral - Puree -- Oral - Mech Soft -- Oral - Regular -- Oral - Multi-Consistency -- Oral - Pill -- Oral Phase - Comment --  CHL IP PHARYNGEAL PHASE 09/21/2020 Pharyngeal Phase Impaired Pharyngeal- Pudding Teaspoon -- Pharyngeal -- Pharyngeal- Pudding Cup -- Pharyngeal -- Pharyngeal- Honey Teaspoon -- Pharyngeal -- Pharyngeal- Honey Cup -- Pharyngeal -- Pharyngeal- Nectar Teaspoon -- Pharyngeal -- Pharyngeal- Nectar Cup Delayed swallow initiation-vallecula;Penetration/Aspiration during swallow Pharyngeal Material enters airway, remains ABOVE vocal cords then ejected out Pharyngeal- Nectar Straw Delayed swallow initiation-vallecula;Penetration/Aspiration during swallow Pharyngeal Material enters airway, remains ABOVE vocal cords then ejected out Pharyngeal- Thin Teaspoon -- Pharyngeal -- Pharyngeal- Thin Cup Delayed swallow initiation-vallecula;Penetration/Aspiration during swallow Pharyngeal Material enters airway, remains ABOVE vocal cords and not ejected out;Material enters airway, passes BELOW cords without attempt by patient to eject out (silent aspiration) Pharyngeal- Thin Straw Delayed swallow initiation-vallecula;Penetration/Aspiration during swallow Pharyngeal Material  enters airway, remains ABOVE vocal cords and not ejected out;Material enters airway, CONTACTS cords and not ejected out;Material enters airway, passes BELOW cords without attempt by patient to eject out (silent aspiration) Pharyngeal- Puree Delayed swallow initiation-vallecula Pharyngeal -- Pharyngeal- Mechanical Soft -- Pharyngeal -- Pharyngeal- Regular Delayed swallow initiation-vallecula Pharyngeal -- Pharyngeal- Multi-consistency Delayed swallow initiation-vallecula Pharyngeal -- Pharyngeal- Pill Delayed swallow initiation-vallecula;Penetration/Aspiration during swallow  Pharyngeal -- Pharyngeal Comment --  CHL IP CERVICAL ESOPHAGEAL PHASE 09/21/2020 Cervical Esophageal Phase Impaired Pudding Teaspoon -- Pudding Cup -- Honey Teaspoon -- Honey Cup -- Nectar Teaspoon -- Nectar Cup -- Nectar Straw -- Thin Teaspoon -- Thin Cup -- Thin Straw (No Data); See impressions Puree -- Mechanical Soft -- Regular -- Multi-consistency -- Pill -- Cervical Esophageal Comment -- Tobie Poet I. Hardin Negus, [place], [place] Office number [phone] Pager [phone] Horton Marshall 09/21/2020, 2:27 PM              US Abdomen Limited RUQ (LIVER/GB)  Result Date: 09/21/2020 CLINICAL DATA:  Right upper quadrant pain EXAM: ULTRASOUND ABDOMEN LIMITED RIGHT UPPER QUADRANT COMPARISON:  CT 1 day prior  FINDINGS: Gallbladder: There is cholelithiasis without evidence for gallbladder wall thickening. There is gallbladder sludge. The sonographic Percell Miller sign is reported as positive. Common bile duct: Diameter: 3 mm Liver: The liver is heterogeneous with a nodular contour. Portal vein is patent on color Doppler imaging with normal direction of blood flow towards the liver. Other: There is a right-sided pleural effusion. There is a small volume of ascites. IMPRESSION: 1. Findings are equivocal for acute calculus cholecystitis. While the sonographic Percell Miller sign is positive in the presence of gallstones, there is no  gallbladder wall thickening. 2. Cirrhotic appearing liver. 3. Small volume ascites. 4. Incidentally noted right-sided pleural effusion. Electronically Signed   By: Constance Holster M.D.   On: 09/21/2020 19:49   Assessment/Plan:  Active Problems:   Protein-calorie malnutrition, severe ([place])   DNR (do not resuscitate) discussion   Appendicitis   S/P appendectomy  Ms. Merrilee Ancona is a 85 year old female with past medical history of IDDM, CKD3, hypoalbuminemia, chronic right hip fracture, recent admission for emphysematous cystitis and left-sided emphysematous pyelitis with ESBL klebsiella bacteremia who presented to [facility] on 11/08 with right lower quadrant abdominal pain found to have acute appendicitis s/p appendectomy on 11/08 with hospital course complicated by continued ESBL producing klebsiella pneumoniae urinary tract infection despite previously completed treatment course with meropenem.  #ESBL producing Klebsiella Pneumoniae UTI, active Following patient's appendectomy, patient continued to endorse suprapubic abdominal pain on examination. Urinalysis obtained was concerning for urinary tract infection with follow-up urine cultures showing continued growth >100,000 ESBL producing Klebsiella Pneumoniae sensitive to imipenem, ciprofloxacin, and piperacillin/tazobactam. She was started on meropenem on 11/10 per pharmacy dosing. As patient has continued ESBL producing Klebsiella Pneumoniae since her prior hospitalization, where she was treated with two-week course of meropenem, infectious disease was consulted for further suggestions and management. Patient's pain, WBC count, and blood pressure stabilizing with continuation of antibiotics. -Appreciate ID recommendations  -Continue meropenem 1g IV Q12H for 10 days of total therapy (currently day 6/10) -Appreciate urology recommendations  -Removal left ureteral stent on 10/12/2020  -Foley to gravity -Daily CBC  #Goals of care Patient is a  chronically-ill, bed-bound, very pleasant, elderly female who lives at home with her daughter who is her sole caregiver. Patient was recently admitted to our service for emphysematous cystitis and emphysematous left pyelitis and underwent urological surgical procedure with stent placement. During prior admission, patient's daughter requested patient to be DNR. Following discharge, patient reportedly in significant pain at home and often crying throughout the night. Patient's daughter expressed that she desires for her mother to be comfortable. Patient returned to our service with change in code status to Full code and underwent appendectomy. Patient continuing hospitalization due to ongoing urinary tract infection and acute kidney injury. Patient expressed to our team directly her desire to die peacefully and not pursue aggressive treatment following completion of antibiotic therapy, however patient's daughter desires aggressive interventions including additional surgical intervention if necessary and continued Full Code. Due to discrepancy between patient's goals of care and family's goals of care palliative care team has been consulted, and we truly appreciate their service and recommendations. -Palliative Care team following and providing recommendations:  -Transitions of care outpatient palliative support ordered  -Transitions of care financial support ordered  -Consult to spiritual care  -Continue discussing goals of care and code status with patient and her daughter  #AoCKD 3a, active, improving Patient's creatinine trended up from baseline of 1.3 to 2.12, now trending down to 1.88 most recently. CT Abdomen/Pelvis on  09/20/20 demonstrated retention of IV contrast from initial CT scan on 11/08 consistent with renal insufficiency/nephro toxicity. AKI likely secondary to injury from contrast, urinary tract infection as well as intravascular volume depletion. She is improving gradually with IVF and albumin  PRN -Continue meropenem -Monitor creatinine on daily BMP  #Anasarca, chronic #Hypoalbuminemia, chronic #Cirrhosis, chronic Patient has anasarca in the setting of her profound hypoalbuminemia. During patient's appendectomy, her liver was noticed to have a nodular texture concerning for cirrhosis. LFTs within normal limits, however alkaline phosphatase mildly elevated. INR elevated to 1.5. Patient with reactive hepatitis B core antibody and surface antibody suggesting prior hepatitis B infection. Pending patient's desires, she may benefit from further workup of this condition in the outpatient setting with gastroenterology. -Consider follow-up with gastroenterology in outpatient setting if in line with patient's goals of care.  #Arrythmia, active Patient developed episode of atrial fibrillation with RVR on the evening of 11/11 without subjective complaints of palpitations, chest pain, shortness of breath, dizziness or lightheadedness. Telemetry revealed findings concerning for second-degree Mobitz type 1 versus type 2 block. EKG obtained which was largely indeterminate, however concern for prolonged PR interval suggesting first-degree heart block. Patient's arrhythmia likely secondary to her multiple ongoing acute and chronic conditions. Due to her overall condition, the risks of anticoagulation at this time outweigh the benefit. -Continue to monitor on telemetry  #Poor oral intake, active For several months, patient has had poor oral intake. RD consulted and provided recommendations. SLP evaluated, performed barium swallow and recommending nectar thick liquids. Patient is at a very high risk for aspiration as she consumes all fluids and solid food while lying supine. -Boost supplementation TID (although patient often refusing) -Carb modified diet now, nectar thick liquids  #Appendicitis s/p appendectomy Patient presented with right lower quadrant abdominal pain with evidence of early appendicitis  on CT abdomen/pelvis. Patient underwent appendectomy with general surgery on 18/29 without complication. The appendix was noted to be mildly inflamed and sent to pathology for further evaluation. Surgical pathology evaluation confirmed acute appendicitis. Following the operation, patient continued to report waxing and waning mild right-sided abdominal pain. -Acetaminophen 650mg  Q6H -Oxycodone 5mg  Q4H PRN -Morphine 1-4mg  Q4H PRN for breakthrough pain -Zofran 4mg  Q6H PRN  #IDDM, chronic Patient's CBGs gradually trending up most recently stable in the 200s. -Escalate SSI from sensitive to moderate -CBGs with SSI before meals and bedtime  #VTE ppx: Lovenox 30mg  daily #Diet: Carb modified, nectar thick #Bowel regimen: Miralax daily PRN #Code status: Full code (appreciate palliative care consultation and support) #PT/OT recs: No PT/OT follow up; supervision/assistance - 24 hour  Cato Mulligan, MD 09/25/2020, 6:57 AM Pager: [phone] After 5pm on weekdays and 1pm on weekends: On Call pager [phone]

## 2020-09-25 NOTE — Progress Notes (Signed)
Hydrologist [facility])  Hospital Liaison RN note         Notified by Magdalen Spatz, [facility] of patient/family request for [facility] Palliative services at home after discharge.         Spoke with patient daugher to confirm interest and explain services.               [facility] Palliative team will follow up with patient after discharge.         Please call with any hospice or palliative related questions.         Thank you for the opportunity to participate in this patient's care.     Gar Ponto, RN [facility] Liaison (on [place])    [phone]

## 2020-09-25 NOTE — Progress Notes (Addendum)
This chaplain is responding to PMT consult for creating the Pt. Advance Directive.  The chaplain is coordinating the use of [place] interpreter for the Pt. spiritual care visit with the PMT.  *1300-The chaplain phoned Interpreter Services [phone]. This  Chaplain understands Janett Billow is scheduling a [place] interpreter for Tuesday morning. Janett Billow will call back when she has a confirmation.  *1515-[employer]pre[employer] Services confirmed a 9:30am appointment with Everlean Patterson, a [place] interpreter to assist with Advance Care Planning in the Pt. room.

## 2020-09-25 NOTE — TOC Initial Note (Addendum)
Transition of Care [facility]) - Initial/Assessment Note    Patient Details  Name: Whitney [name] MRN: [id number] Date of Birth: [birth year]  Transition [facility]) CM/SW Contact:    Marilu Favre, RN Phone Number: 09/25/2020, 3:23 PM  Clinical Narrative:                  Spoke to daughter at bedside. Confirmed address and phone number.   Patient's PCP is DR [name].   Daughter has completed paperwork through Dr Avbuere's office and Janeece Riggers for personnel care workers. Daughter wants to be her mother's personnel care work and be paid to do so. NCM explained that , that would be up to [place] , however, NCM believes they have stopped paying family members. Daughter will check with [place].   Patient already has a hospital bed and a hoyer lift at home.   Daughter asking for ambulance transport resource , provided same.   Daughter in agreement for Palliative Care referral , but not hospice. Left message for Gar Ponto with  [facility]. Judeen Hammans returned call and accept referral for palliative care at home   Patient currently on oxygen in the hospital however does not have home oxygen.  Expected Discharge Plan: Home/Self Care Barriers to Discharge: Continued Medical Work up   Patient Goals and CMS Choice Patient states their goals for this hospitalization and ongoing recovery are:: to go home CMS Medicare.gov Compare Post Acute Care list provided to:: Patient Choice offered to / list presented to : Patient, Adult Children  Expected Discharge Plan and Services Expected Discharge Plan: Home/Self Care   Discharge Planning Services: CM Consult   Living arrangements for the past 2 months: Single Family Home                   DME Agency: NA                  Prior Living Arrangements/Services Living arrangements for the past 2 months: Single Family Home Lives with:: Adult Children Patient language and need for interpreter reviewed:: Yes Do you feel safe going back  to the place where you live?: Yes      Need for Family Participation in Patient Care: Yes (Comment) Care giver support system in place?: Yes (comment) Current home services: DME Criminal Activity/Legal Involvement Pertinent to Current Situation/Hospitalization: No - Comment as needed  Activities of Daily Living Home Assistive Devices/Equipment: Wheelchair ADL Screening (condition at time of admission) Patient's cognitive ability adequate to safely complete daily activities?: No Is the patient deaf or have difficulty hearing?: Yes Does the patient have difficulty seeing, even when wearing glasses/contacts?: No Does the patient have difficulty concentrating, remembering, or making decisions?: Yes Patient able to express need for assistance with ADLs?: Yes Does the patient have difficulty dressing or bathing?: No Independently performs ADLs?: Yes (appropriate for developmental age) Does the patient have difficulty walking or climbing stairs?: Yes Weakness of Legs: Both Weakness of Arms/Hands: None  Permission Sought/Granted   Permission granted to share information with : Yes, Verbal Permission Granted  Share Information with NAME: daughter Caro Hight [phone]           Emotional Assessment Appearance:: Appears stated age       Alcohol / Substance Use: Not Applicable Psych Involvement: No (comment)  Admission diagnosis:  Appendicitis [K37] S/P appendectomy [Z90.49] Patient Active Problem List   Diagnosis Date Noted  . Appendicitis 09/18/2020  . S/P appendectomy 09/18/2020  . Bacteremia due to Klebsiella  pneumoniae 08/22/2020  . Abdominal distention   . Acute cystitis with hematuria   . Hip pain   . Leukocytosis   . Pressure injury of skin 08/18/2020  . Sepsis ([place]) 08/17/2020  . Pyelitis 08/17/2020  . Dehydration   . Pain   . Goals of care, counseling/discussion   . DNR (do not resuscitate) discussion   . Palliative care by specialist   . Closed right hip fracture,  initial encounter ([place]) 03/03/2020  . Diabetes mellitus without complication ([place])   . CKD (chronic kidney disease), stage III ([place])   . Closed right hip fracture ([place])   . Protein-calorie malnutrition, severe ([place])   . Bilateral leg edema   . Intractable pain 03/02/2020   PCP:  Patient, No Pcp Per Pharmacy:   [id number] [place], [address] [place] [place] [address] Phone: [phone] Fax: [phone]     Social Determinants of Health (SDOH) Interventions    Readmission Risk Interventions No flowsheet data found.

## 2020-09-25 NOTE — Progress Notes (Signed)
CSW called pt daughter regarding TOC consult. Daughter said she will be free to talk at 12pm and will call CSW.

## 2020-09-25 NOTE — Consult Note (Addendum)
[place] Nurse Consult Note: Patient receiving care in [place].  Assessment completed by use of TeleInterpreter, Hinton Dyer, Operator [phone] Reason for Consult: "Sacral and leg wound" Wound type: NO wound on the sacrum. RLE wound orders were placed 10/08.  Please follow those. Monitor the wound area(s) for worsening of condition such as: Signs/symptoms of infection,  Increase in size,  Development of or worsening of odor, Development of pain, or increased pain at the affected locations.  Notify the medical team if any of these develop.  Thank you for the consult.  Discussed plan of care with the patient and bedside nurse.  [place] nurse will not follow at this time.  Please re-consult the [facility] team if needed.  Val Riles, RN, MSN, CWOCN, CNS-BC, pager [phone]

## 2020-09-26 DIAGNOSIS — Z7189 Other specified counseling: Secondary | ICD-10-CM | POA: Diagnosis not present

## 2020-09-26 DIAGNOSIS — N390 Urinary tract infection, site not specified: Secondary | ICD-10-CM | POA: Diagnosis present

## 2020-09-26 DIAGNOSIS — Z515 Encounter for palliative care: Secondary | ICD-10-CM | POA: Diagnosis not present

## 2020-09-26 DIAGNOSIS — R627 Adult failure to thrive: Secondary | ICD-10-CM | POA: Diagnosis not present

## 2020-09-26 DIAGNOSIS — B9689 Other specified bacterial agents as the cause of diseases classified elsewhere: Secondary | ICD-10-CM | POA: Diagnosis present

## 2020-09-26 DIAGNOSIS — E43 Unspecified severe protein-calorie malnutrition: Secondary | ICD-10-CM | POA: Diagnosis not present

## 2020-09-26 LAB — CBC
HCT: 27 % — ABNORMAL LOW (ref 36.0–46.0)
Hemoglobin: 8.2 g/dL — ABNORMAL LOW (ref 12.0–15.0)
MCH: 28.8 pg (ref 26.0–34.0)
MCHC: 30.4 g/dL (ref 30.0–36.0)
MCV: 94.7 fL (ref 80.0–100.0)
Platelets: 107 10*3/uL — ABNORMAL LOW (ref 150–400)
RBC: 2.85 MIL/uL — ABNORMAL LOW (ref 3.87–5.11)
RDW: 15 % (ref 11.5–15.5)
WBC: 5.9 10*3/uL (ref 4.0–10.5)
nRBC: 0 % (ref 0.0–0.2)

## 2020-09-26 LAB — BASIC METABOLIC PANEL
Anion gap: 6 (ref 5–15)
BUN: 27 mg/dL — ABNORMAL HIGH (ref 8–23)
CO2: 25 mmol/L (ref 22–32)
Calcium: 7.9 mg/dL — ABNORMAL LOW (ref 8.9–10.3)
Chloride: 113 mmol/L — ABNORMAL HIGH (ref 98–111)
Creatinine, Ser: 1.72 mg/dL — ABNORMAL HIGH (ref 0.44–1.00)
GFR, Estimated: 29 mL/min — ABNORMAL LOW (ref >60)
Glucose, Bld: 203 mg/dL — ABNORMAL HIGH (ref 70–99)
Potassium: 4.4 mmol/L (ref 3.5–5.1)
Sodium: 144 mmol/L (ref 135–145)

## 2020-09-26 LAB — GLUCOSE, CAPILLARY
Glucose-Capillary: 174 mg/dL — ABNORMAL HIGH (ref 70–99)
Glucose-Capillary: 198 mg/dL — ABNORMAL HIGH (ref 70–99)
Glucose-Capillary: 180 mg/dL — ABNORMAL HIGH (ref 70–99)
Glucose-Capillary: 168 mg/dL — ABNORMAL HIGH (ref 70–99)

## 2020-09-26 NOTE — Progress Notes (Signed)
Inpatient Diabetes Program Recommendations  AACE/ADA: New Consensus Statement on Inpatient Glycemic Control   Target Ranges:  Prepandial:   less than 140 mg/dL      Peak postprandial:   less than 180 mg/dL (1-2 hours)      Critically ill patients:  140 - 180 mg/dL   Results for CAIRO, LINGENFELTER (MRN [id number]) as of 09/26/2020 10:13  Ref. Range 09/25/2020 07:44 09/25/2020 12:21 09/25/2020 17:11 09/25/2020 21:50 09/26/2020 07:41  Glucose-Capillary Latest Ref Range: 70 - 99 mg/dL 189 (H) 198 (H) 212 (H) 220 (H) 174 (H)   Review of Glycemic Control  Diabetes history: DM2 Outpatient Diabetes medications: None Current orders for Inpatient glycemic control: Novolog 0-15 units TID with meals, Novolog 0-5 units QHS  Inpatient Diabetes Program Recommendations:    Insulin: Please consider ordering Novolog 3 units TID with meals for meal coverage if patient eats at least 50% of meals.  Thanks, Barnie Alderman, RN, MSN, CDE Diabetes Coordinator Inpatient Diabetes Program [phone] (Team Pager from 8am to 5pm)

## 2020-09-26 NOTE — Progress Notes (Signed)
Subjective:  [name] is a 85 year old female with past medical history of IDDM, CKD3, hypoalbuminemia, chronic right hip fracture, recent admission for emphysematous cystitis and left-sided emphysematous pyelitis with ESBL klebsiella bacteremia who presented to [facility] on 11/08 with right lower quadrant abdominal pain found to have acute appendicitis s/p appendectomy on 11/08 with hospital course complicated by continued ESBL producing klebsiella pneumoniae urinary tract infection despite previously completed treatment course with meropenem.  Overnight, no acute events.  This morning, patient denies pain in her abdomen. She endorses some continued pain in her lower extremities, left worse than right.  Objective:  Vital signs in last 24 hours: Vitals:   09/25/20 0600 09/25/20 1227 09/25/20 2037 09/26/20 0300  BP: (!) 131/51 (!) 118/47 (!) 120/50 (!) 104/59  Pulse: 93 83 90 88  Resp: 18 18 18 17   Temp: 97.7 F (36.5 C) 98.1 F (36.7 C) (!) 97.5 F (36.4 C) (!) 97.4 F (36.3 C)  TempSrc: Oral Oral Axillary Axillary  SpO2: 100% 100% 99% 94%  Weight:      Height:      SpO2: 94 % O2 Flow Rate (L/min): 2 L/min  Filed Weights   09/18/20 1158 09/21/20 0500  Weight: 90 kg 82.1 kg    Intake/Output Summary (Last 24 hours) at 09/26/2020 1322 Last data filed at 09/25/2020 2035 Gross per 24 hour  Intake --  Output 250 ml  Net -250 ml   Physical Exam Vitals and nursing note reviewed. Exam conducted with a chaperone present.  Constitutional:      General: She is not in acute distress.    Appearance: She is ill-appearing.  HENT:     Head: Normocephalic and atraumatic.  Cardiovascular:     Rate and Rhythm: Normal rate and regular rhythm.     Heart sounds: Normal heart sounds.  Pulmonary:     Effort: Pulmonary effort is normal. No respiratory distress.     Breath sounds: Rales present.  Abdominal:     General: Abdomen is flat. Bowel sounds are normal. There is distension.      Palpations: Abdomen is soft.     Tenderness: There is no abdominal tenderness.  Musculoskeletal:     Right lower leg: 3+ Pitting Edema present.     Left lower leg: 3+ Pitting Edema present.  Skin:    General: Skin is warm and dry.     Capillary Refill: Capillary refill takes less than 2 seconds.  Neurological:     General: No focal deficit present.     Mental Status: She is alert and oriented to person, place, and time.  Psychiatric:        Mood and Affect: Mood normal.        Behavior: Behavior normal.    CBC Latest Ref Rng & Units 09/26/2020 09/25/2020 09/24/2020  WBC 4.0 - 10.5 K/uL 5.9 6.0 4.8  Hemoglobin 12.0 - 15.0 g/dL 8.2(L) 8.0(L) 7.7(L)  Hematocrit 36 - 46 % 27.0(L) 26.4(L) 25.3(L)  Platelets 150 - 400 K/uL 107(L) 92(L) 109(L)   CMP Latest Ref Rng & Units 09/26/2020 09/25/2020 09/24/2020  Glucose 70 - 99 mg/dL 203(H) 236(H) 249(H)  BUN 8 - 23 mg/dL 27(H) 28(H) 29(H)  Creatinine 0.44 - 1.00 mg/dL 1.72(H) 1.79(H) 1.88(H)  Sodium 135 - 145 mmol/L 144 141 143  Potassium 3.5 - 5.1 mmol/L 4.4 5.0 4.9  Chloride 98 - 111 mmol/L 113(H) 109 113(H)  CO2 22 - 32 mmol/L 25 22 25   Calcium 8.9 - 10.3 mg/dL  7.9(L) 7.7(L) 7.5(L)  Total Protein 6.5 - 8.1 g/dL - - -  Total Bilirubin 0.3 - 1.2 mg/dL - - -  Alkaline Phos 38 - 126 U/L - - -  AST 15 - 41 U/L - - -  ALT 0 - 44 U/L - - -   Urine culture: ->100,000 ESBL producing klebsiella pneumoniae  -Sensitive to ciprofloxacin, imipenem, piperacillin/tazobactam  -Resistant to ampicillin, cefazolin, cefepime, ceftriaxone, gentamicin, nitrofurantoin, trimethoprim/sulfamethoxazole, ampicillin/sulbactam  Blood cultures collected 11/2: No growth for three days  DG Abd 1 View  Result Date: 09/25/2020 CLINICAL DATA:  Abdominal pain and distension EXAM: ABDOMEN - 1 VIEW COMPARISON:  August 22, 2020 FINDINGS: Double-J stent present on the left. There is contrast in portions of stomach and colon. No bowel dilatation or air-fluid level  to suggest bowel obstruction. No free air. Total hip replacement on the left. Inferior vena cava present. IMPRESSION: No bowel obstruction or free air. Double-J stent again noted on the left. Total hip replacement on the left. Filter in inferior vena cava. Electronically Signed   By: Lowella Grip III M.D.   On: 09/25/2020 09:51   Assessment/Plan:  Active Problems:   Protein-calorie malnutrition, severe ([place])   DNR (do not resuscitate) discussion   Appendicitis   S/P appendectomy  [name] is a 85 year old female with past medical history of IDDM, CKD3, hypoalbuminemia, chronic right hip fracture, recent admission for emphysematous cystitis and left-sided emphysematous pyelitis with ESBL klebsiella bacteremia who presented to [facility] on 11/08 with right lower quadrant abdominal pain found to have acute appendicitis s/p appendectomy on 11/08 with hospital course complicated by continued ESBL producing klebsiella pneumoniae urinary tract infection despite previously completed treatment course with meropenem.  #ESBL producing Klebsiella Pneumoniae UTI, active Following patient's appendectomy, patient continued to endorse suprapubic and right-sided abdominal pain. Urinalysis obtained was concerning for urinary tract infection with follow-up urine cultures showing continued growth >100,000 ESBL producing Klebsiella Pneumoniae sensitive to imipenem, ciprofloxacin, and piperacillin/tazobactam. She was started on meropenem on 11/10 per pharmacy dosing. As patient has continued ESBL producing Klebsiella Pneumoniae since her prior hospitalization, where she was treated with two-week course of meropenem, infectious disease was consulted for further suggestions and management. Patient's pain, WBC count, and blood pressure stabilizing with continuation of antibiotics. -Appreciate ID recommendations  -Continue meropenem 1g IV Q12H for 10 days of total therapy (currently day 7/10) -Appreciate urology  recommendations  -Removal left ureteral stent on 10/12/2020  -Foley to gravity -Daily CBC  #Goals of care Patient is a chronically-ill, bed-bound, very pleasant, elderly female who lives at home with her daughter who is her sole caregiver. Patient was recently admitted to our service for emphysematous cystitis and emphysematous left pyelitis and underwent urological surgical procedure with stent placement. During prior admission, patient's daughter requested patient to be DNR. Following discharge, patient reportedly in significant pain at home and often crying throughout the night. Patient's daughter expressed that she desires for her mother to be comfortable. Patient returned to our service with change in code status to Full code and underwent appendectomy. Patient continuing hospitalization due to urinary tract infection and acute kidney injury. Patient expressed to our team directly her desire to die peacefully and not pursue aggressive treatment following completion of antibiotic therapy, however patient's daughter desires aggressive interventions including additional surgical intervention if necessary and continued Full Code. Due to discrepancy between patient's goals of care and family's goals of care palliative care team has been consulted, and we truly appreciate their service and  recommendations. -Palliative Care team following, appreciate recommendations:  -Transitions of care outpatient palliative support  -Transitions of care financial support  -Chaplain following  -Patient's daughter interested in [facility] palliative services at home after discharge  -follow-up palliative care meeting with [place] translator from this morning  #AoCKD 3a, active, improving Patient's creatinine trended up from baseline of 1.3 to 2.12, now trending down to 1.72 most recently. CT Abdomen/Pelvis on 09/20/20 demonstrated retention of IV contrast from initial CT scan on 11/08 consistent with renal  insufficiency/nephro toxicity. AKI likely secondary to injury from contrast, urinary tract infection as well as intravascular volume depletion. Patient appears more volume overloaded on examination today in the setting of gentle IVF, therefore we will continue to hold additional IVF today. -Continue meropenem -Monitor creatinine on daily BMP  #Anasarca, chronic #Hypoalbuminemia, chronic #Cirrhosis, chronic Patient has anasarca in the setting of her profound hypoalbuminemia. During patient's appendectomy, her liver was noticed to have a nodular texture concerning for cirrhosis. LFTs within normal limits, however alkaline phosphatase mildly elevated. INR elevated. Patient with reactive hepatitis B core antibody and surface antibody suggesting prior hepatitis B infection. Pending patient's desires, she may benefit from further workup of this condition in the outpatient setting with gastroenterology. -Consider follow-up with gastroenterology in outpatient setting if in line with patient's goals of care.  #Arrythmia, active Patient developed episode of atrial fibrillation with RVR on the evening of 11/11 without subjective complaints of palpitations, chest pain, shortness of breath, dizziness or lightheadedness. Telemetry revealed findings concerning for second-degree Mobitz type 1 versus type 2 block. EKG obtained which was largely indeterminate, however concern for prolonged PR interval suggesting first-degree heart block. Patient's arrhythmia likely secondary to her multiple ongoing acute and chronic conditions. Due to her overall condition, the risks of anticoagulation at this time outweigh the benefit. -Continue to monitor on telemetry  #Poor oral intake, active For several months, patient has had poor oral intake. RD consulted and provided recommendations. SLP evaluated, performed barium swallow and recommending nectar thick liquids. Patient is at a very high risk for aspiration as she consumes all  fluids and solid food while lying supine. -Boost supplementation TID (although patient often refusing) -Carb modified diet now, nectar thick liquids  #Appendicitis s/p appendectomy Patient presented with right lower quadrant abdominal pain with evidence of early appendicitis on CT abdomen/pelvis. Patient underwent appendectomy with general surgery on 14/43 without complication. The appendix was noted to be mildly inflamed and sent to pathology for further evaluation. Surgical pathology evaluation consistent with acute appendicitis. -Acetaminophen 650mg  Q6H -Oxycodone 5mg  Q4H PRN -Morphine 1-4mg  Q4H PRN for breakthrough pain -Zofran 4mg  Q6H PRN  #IDDM, chronic Patient initially on sensitive SSI with escalation to moderate on 11/15. -Continue moderate SSI -CBGs  #Asymptomatic external hemorrhoids, chronic -Continue topical hydrocortisone cream twice daily  #VTE ppx: Lovenox 30mg  daily #Diet: Carb modified, nectar thick #Bowel regimen: Miralax daily PRN #Code status: Full code (appreciate palliative care consultation and support) #PT/OT recs: No PT/OT follow up; supervision/assistance - 24 hour  Cato Mulligan, MD 09/26/2020, 1:22 PM Pager: [phone] After 5pm on weekdays and 1pm on weekends: On Call pager [phone]

## 2020-09-26 NOTE — Plan of Care (Signed)

## 2020-09-26 NOTE — Progress Notes (Addendum)
Patient ID: Whitney [name], female   DOB: 1935-07-21, 85 y.o.   MRN: [id number]  This nurse practitioner reviewed medical records and received report from team before meeting patient at bedside along with [place] interpreter and chaplain.  85 year old female with past medical history of IDDM, CKD3, hypoalbuminemia, chronic right hip fracture, recent admission for emphysematous cystitis and left-sided emphysematous pyelitis with ESBL klebsiella bacteremia who presented to [facility] on 11/08 with right lower quadrant abdominal pain found to have acute appendicitis s/p appendectomy on 11/08.  1. Seen on CT A/P 08/17/2020.S/p cystoscopy, left ureteral stent placement on 08/18/2020.CT A/P 08/20/2020 with decompressed left renal collecting system with no gas in the collecting system and bladder decompressed around a Foley catheter with no further gas within the bladder.Received prolonged course of antibiotics and ultimately discharged home. Represented with ESWL Klebsiella.CT A/P 09/20/2020 revealed left ureteral stent placement in appropriate position with no hydronephrosis and collapsed bladder around a Foley catheter.  Patient remains weak with poor po intake, overall failure to thrive.   Albumin 1.3   Creatine  1.72     Concept of Palliative Care was introduced/education  as specialized medical care for people and their families living with serious illness.  It focuses on providing relief from the symptoms and stress of a serious illness.  The goal is to improve quality of life for both the patient and the family.  A  discussion was had today regarding advanced directives.  Concepts specific to code status, artifical feeding and hydration, continued IV antibiotics and rehospitalization was had.  The difference between a aggressive medical intervention path  and a palliative comfort care path for this patient at this time was had.  Values and goals of care important to patient and family were attempted to be  elicited.   Created space and opportunity for patient to explore thoughts and feelings regarding current medical information   Coversation had with patient regarding her current medical situation.  She understands her main conditions are around her "urinary  System". Patient verbalizes her understanding that her body is weakening from "growing old" She verbalizes an understanding and acceptance of her own mortality and allowing death to come with out aggressive medical interventions.  However when questioned on her daughter's understanding of the same concept she acknowledges that her daughter is not excepting of that path at this time. Patient clearly verbalizes her desire to follow along with her daughter's wishes as it relates to patient remaining a full code and open to all medical interventions to prolong life at this time.  Patient also clearly verbalizes that in the event that she cannot speak for herself her daughter/ Sandria Manly would be her spokesperson..  I then spoke to daughter/ Milta Deiters and had conversation covering above concepts..  I shared with her her mother's sentiments regarding end-of-life and allowing a natural course but her desire to do as her daughter wishes.  Education offered on concepts of adult failure to thrive and the patient's high risk for decompensation.  Daughter reinforces that as a family, there is a son who lives in [place] who I believe is a physician, they wish for everything to be done to prolong life for the patient. Part of this request comes at the hope family could possibly get to see the patient before she dies.  Discussed with patient/family  the importance of continued conversation with her  family and the  medical providers regarding overall plan of care and treatment options,  ensuring decisions are within  the context of the patients values and GOCs.  Questions and concerns addressed     Total time spent on the unit was 45  minutes  Greater than 50% of the time was spent in counseling and coordination of care  Wadie Lessen NP  Palliative Medicine Team Team Phone # [phone] Pager [phone]

## 2020-09-26 NOTE — Progress Notes (Signed)
Pharmacy Antibiotic Note  Whitney [name] is a 85 y.o. female admitted on 09/18/2020 with abdominal pain, found to have appendicitis now s/p appendectomy on 09/18/20.   Pharmacy has been consulted for Merrem dosing for ESBL K. pneumo UTI.  Patient has a history of ESBL Klebsiella bacteremia and emphysematous pyelitis.  SCr 1.72, CrCL < 30 ml/min, afebrile, WBC WNL  Plan: Continue Merrem 1gm IV Q12H Pharmacy will sign off with stable renal function.  Thank you for the consult!   Height: 5\' 3"  (160 cm) Weight: 82.1 kg (181 lb) IBW/kg (Calculated) : 52.4  Temp (24hrs), Avg:97.7 F (36.5 C), Min:97.4 F (36.3 C), Max:98.1 F (36.7 C)  Recent Labs  Lab 09/21/20 0053 09/21/20 0053 09/22/20 0102 09/23/20 0227 09/24/20 0312 09/25/20 0952 09/25/20 1200 09/26/20 0638  WBC 11.2*  --  10.2 7.6 4.8  --  6.0  --   CREATININE 1.83*   < > 2.12* 1.96* 1.88* 1.79*  --  1.72*   < > = values in this interval not displayed.    Estimated Creatinine Clearance: 25.2 mL/min (A) (by C-G formula based on SCr of 1.72 mg/dL (H)).    Allergies  Allergen Reactions  . Succinylcholine Other (See Comments)    No formal diagnosis of pseudocholinesterase deficiency; however, prolonged paralysis during anesthetics on 09/18/20 and 08/18/20.    CTX x1 11/8 Flagyl x1 11/8 Merrem 11/10 >>  11/10 UCx - ESBL Kleb pneumo 11/12 BCx - NGTD  Ikaika Showers D. Mina Marble, PharmD, BCPS, [place] 09/26/2020, 8:53 AM

## 2020-09-26 NOTE — Plan of Care (Signed)
  Problem: Clinical Measurements: Goal: Ability to maintain clinical measurements within normal limits will improve 09/26/2020 0839 by Dorena Cookey, LPN Outcome: Progressing 09/26/2020 0124 by Dorena Cookey, LPN Outcome: Progressing Goal: Will remain free from infection 09/26/2020 0839 by Dorena Cookey, LPN Outcome: Progressing 09/26/2020 0124 by Dorena Cookey, LPN Outcome: Progressing Goal: Diagnostic test results will improve 09/26/2020 0839 by Dorena Cookey, LPN Outcome: Progressing 09/26/2020 0124 by Dorena Cookey, LPN Outcome: Progressing Goal: Respiratory complications will improve 09/26/2020 0839 by Dorena Cookey, LPN Outcome: Progressing 09/26/2020 0124 by Dorena Cookey, LPN Outcome: Progressing Goal: Cardiovascular complication will be avoided 09/26/2020 0839 by Dorena Cookey, LPN Outcome: Progressing 09/26/2020 0124 by Dorena Cookey, LPN Outcome: Progressing   Problem: Activity: Goal: Risk for activity intolerance will decrease 09/26/2020 0839 by Dorena Cookey, LPN Outcome: Progressing 09/26/2020 0124 by Dorena Cookey, LPN Outcome: Progressing   Problem: Nutrition: Goal: Adequate nutrition will be maintained 09/26/2020 0839 by Dorena Cookey, LPN Outcome: Progressing 09/26/2020 0124 by Dorena Cookey, LPN Outcome: Progressing   Problem: Coping: Goal: Level of anxiety will decrease 09/26/2020 0839 by Dorena Cookey, LPN Outcome: Progressing 09/26/2020 0124 by Dorena Cookey, LPN Outcome: Progressing   Problem: Elimination: Goal: Will not experience complications related to bowel motility 09/26/2020 0839 by Dorena Cookey, LPN Outcome: Progressing 09/26/2020 0124 by Dorena Cookey, LPN Outcome: Progressing Goal: Will not experience complications related to urinary retention 09/26/2020 0839 by Dorena Cookey, LPN Outcome: Progressing 09/26/2020 0124 by Dorena Cookey, LPN Outcome:  Progressing   Problem: Pain Managment: Goal: General experience of comfort will improve 09/26/2020 0839 by Dorena Cookey, LPN Outcome: Progressing 09/26/2020 0124 by Dorena Cookey, LPN Outcome: Progressing   Problem: Safety: Goal: Ability to remain free from injury will improve 09/26/2020 0839 by Dorena Cookey, LPN Outcome: Progressing 09/26/2020 0124 by Dorena Cookey, LPN Outcome: Progressing   Problem: Skin Integrity: Goal: Risk for impaired skin integrity will decrease 09/26/2020 0839 by Dorena Cookey, LPN Outcome: Progressing 09/26/2020 0124 by Dorena Cookey, LPN Outcome: Progressing

## 2020-09-26 NOTE — Progress Notes (Signed)
This chaplain was present with PMT-NP Faylene Million and [place].    The chaplain understands from the time together, the [name] partners in the Pt. medical decisions and care at home. The Pt. describes herself in a peaceful place with her diagnosis and how her spirituality informs her care.   The chaplain will partner with the PMT for informing next steps in the Pt. spiritual care.

## 2020-09-27 ENCOUNTER — Inpatient Hospital Stay (HOSPITAL_COMMUNITY): Payer: 59

## 2020-09-27 DIAGNOSIS — N390 Urinary tract infection, site not specified: Secondary | ICD-10-CM | POA: Diagnosis not present

## 2020-09-27 DIAGNOSIS — Z7189 Other specified counseling: Secondary | ICD-10-CM | POA: Diagnosis not present

## 2020-09-27 DIAGNOSIS — R627 Adult failure to thrive: Secondary | ICD-10-CM

## 2020-09-27 DIAGNOSIS — R110 Nausea: Secondary | ICD-10-CM

## 2020-09-27 DIAGNOSIS — E43 Unspecified severe protein-calorie malnutrition: Secondary | ICD-10-CM | POA: Diagnosis not present

## 2020-09-27 DIAGNOSIS — B9689 Other specified bacterial agents as the cause of diseases classified elsewhere: Secondary | ICD-10-CM

## 2020-09-27 LAB — CBC
HCT: 27.1 % — ABNORMAL LOW (ref 36.0–46.0)
Hemoglobin: 8.3 g/dL — ABNORMAL LOW (ref 12.0–15.0)
MCH: 28.5 pg (ref 26.0–34.0)
MCHC: 30.6 g/dL (ref 30.0–36.0)
MCV: 93.1 fL (ref 80.0–100.0)
Platelets: 118 10*3/uL — ABNORMAL LOW (ref 150–400)
RBC: 2.91 MIL/uL — ABNORMAL LOW (ref 3.87–5.11)
RDW: 15.2 % (ref 11.5–15.5)
WBC: 5.9 10*3/uL (ref 4.0–10.5)
nRBC: 0 % (ref 0.0–0.2)

## 2020-09-27 LAB — GLUCOSE, CAPILLARY
Glucose-Capillary: 196 mg/dL — ABNORMAL HIGH (ref 70–99)
Glucose-Capillary: 177 mg/dL — ABNORMAL HIGH (ref 70–99)
Glucose-Capillary: 152 mg/dL — ABNORMAL HIGH (ref 70–99)
Glucose-Capillary: 156 mg/dL — ABNORMAL HIGH (ref 70–99)

## 2020-09-27 LAB — BASIC METABOLIC PANEL
Anion gap: 7 (ref 5–15)
BUN: 29 mg/dL — ABNORMAL HIGH (ref 8–23)
CO2: 24 mmol/L (ref 22–32)
Calcium: 7.9 mg/dL — ABNORMAL LOW (ref 8.9–10.3)
Chloride: 112 mmol/L — ABNORMAL HIGH (ref 98–111)
Creatinine, Ser: 1.72 mg/dL — ABNORMAL HIGH (ref 0.44–1.00)
GFR, Estimated: 29 mL/min — ABNORMAL LOW (ref >60)
Glucose, Bld: 174 mg/dL — ABNORMAL HIGH (ref 70–99)
Potassium: 4.4 mmol/L (ref 3.5–5.1)
Sodium: 143 mmol/L (ref 135–145)

## 2020-09-27 LAB — CULTURE, BLOOD (ROUTINE X 2)
Culture: NO GROWTH
Culture: NO GROWTH
Special Requests: ADEQUATE

## 2020-09-27 MED ORDER — POLYETHYLENE GLYCOL 3350 17 G PO PACK
17.0000 g | PACK | Freq: Every day | ORAL | Status: DC
  Administered 2020-09-29 – 2020-10-03 (×3): 17 g via ORAL
  Filled 2020-09-27 (×8): qty 1

## 2020-09-27 MED ORDER — SENNA 8.6 MG PO TABS
1.0000 | ORAL_TABLET | Freq: Every day | ORAL | Status: DC
  Administered 2020-09-29 – 2020-10-03 (×4): 8.6 mg via ORAL
  Filled 2020-09-27 (×7): qty 1

## 2020-09-27 MED ORDER — ONDANSETRON HCL 4 MG/2ML IJ SOLN
4.0000 mg | Freq: Once | INTRAMUSCULAR | Status: AC
  Administered 2020-09-27: 4 mg via INTRAVENOUS

## 2020-09-27 NOTE — Progress Notes (Signed)
MD made aware of emesis episodes.  Awaiting orders if any.

## 2020-09-27 NOTE — Progress Notes (Signed)
Paged by patient's RN for vomiting.   SUBJECTIVE: Patient denies abdominal pain or nausea. She states that her last bowel movement was yesterday morning, however she has had no flatulence. She has not had much oral intake today. Patient's daughter at bedside reports that patient has experienced two episodes of vomiting since 1PM today without any oral intake.  OBJECTIVE: Patient tired-appearing but easily arousable and conversational. Abdomen with hypoactive bowel sounds, mildly distended, and nontender to palpation.   ASSESSMENT/PLAN: Patient's overall presentation concerning for an ileus in the setting of her recent appendectomy. She requires further imaging at this time. -Obtain STAT CT Abdomen/Pelvis WO -Hold PO medications pending imaging results -Continue administering IV Zofran PRN

## 2020-09-27 NOTE — Progress Notes (Signed)
Patient had an episode of emesis, very small. Foley catheter was check because NT reported the bed being soiled with urine, deflated balloon and pulled 8.5 mL, advanced catheter and refilled balloon with 10 mL of saline.

## 2020-09-27 NOTE — Progress Notes (Signed)
Holding all PO meds until imaging is resulted per Dr. Cato Mulligan.

## 2020-09-27 NOTE — Plan of Care (Signed)
Problem: Education: Goal: Knowledge of General Education information will improve Description: Including pain rating scale, medication(s)/side effects and non-pharmacologic comfort measures 09/27/2020 1204 by Bethann Punches, RN Outcome: Progressing 09/27/2020 1202 by Bethann Punches, RN Outcome: Progressing   Problem: Health Behavior/Discharge Planning: Goal: Ability to manage health-related needs will improve 09/27/2020 1204 by Bethann Punches, RN Outcome: Progressing 09/27/2020 1202 by Bethann Punches, RN Outcome: Progressing   Problem: Clinical Measurements: Goal: Ability to maintain clinical measurements within normal limits will improve 09/27/2020 1204 by Bethann Punches, RN Outcome: Progressing 09/27/2020 1202 by Bethann Punches, RN Outcome: Progressing Goal: Will remain free from infection 09/27/2020 1204 by Bethann Punches, RN Outcome: Progressing 09/27/2020 1202 by Bethann Punches, RN Outcome: Progressing Goal: Diagnostic test results will improve 09/27/2020 1204 by Bethann Punches, RN Outcome: Progressing 09/27/2020 1202 by Bethann Punches, RN Outcome: Progressing Goal: Respiratory complications will improve 09/27/2020 1204 by Bethann Punches, RN Outcome: Progressing 09/27/2020 1202 by Bethann Punches, RN Outcome: Progressing Goal: Cardiovascular complication will be avoided 09/27/2020 1204 by Bethann Punches, RN Outcome: Progressing 09/27/2020 1202 by Bethann Punches, RN Outcome: Progressing   Problem: Activity: Goal: Risk for activity intolerance will decrease 09/27/2020 1204 by Bethann Punches, RN Outcome: Progressing 09/27/2020 1202 by Bethann Punches, RN Outcome: Progressing   Problem: Nutrition: Goal: Adequate nutrition will be maintained 09/27/2020 1204 by Bethann Punches, RN Outcome: Progressing 09/27/2020 1202 by Bethann Punches, RN Outcome: Progressing   Problem: Coping: Goal: Level of anxiety will decrease 09/27/2020 1204 by Bethann Punches, RN Outcome: Progressing 09/27/2020 1202 by Bethann Punches, RN Outcome: Progressing   Problem: Elimination: Goal: Will not experience complications related to bowel motility 09/27/2020 1204 by Bethann Punches, RN Outcome: Progressing 09/27/2020 1202 by Bethann Punches, RN Outcome: Progressing Goal: Will not experience complications related to urinary retention 09/27/2020 1204 by Bethann Punches, RN Outcome: Progressing 09/27/2020 1202 by Bethann Punches, RN Outcome: Progressing   Problem: Pain Managment: Goal: General experience of comfort will improve 09/27/2020 1204 by Bethann Punches, RN Outcome: Progressing 09/27/2020 1202 by Bethann Punches, RN Outcome: Progressing   Problem: Safety: Goal: Ability to remain free from injury will improve 09/27/2020 1204 by Bethann Punches, RN Outcome: Progressing 09/27/2020 1202 by Bethann Punches, RN Outcome: Progressing   Problem: Skin Integrity: Goal: Risk for impaired skin integrity will decrease 09/27/2020 1204 by Bethann Punches, RN Outcome: Progressing 09/27/2020 1202 by Bethann Punches, RN Outcome: Progressing   Problem: Education: Goal: Required Educational Video(s) Outcome: Progressing   Problem: Clinical Measurements: Goal: Postoperative complications will be avoided or minimized Outcome: Progressing   Problem: Skin Integrity: Goal: Demonstration of wound healing without infection will improve Outcome: Progressing   Problem: Education: Goal: Ability to describe self-care measures that may prevent or decrease complications (Diabetes Survival Skills Education) will improve Outcome: Progressing Goal: Individualized Educational Video(s) Outcome: Progressing   Problem: Coping: Goal: Ability to adjust to condition or change in health will improve Outcome: Progressing   Problem: Fluid Volume: Goal: Ability to maintain a balanced intake and output will improve Outcome: Progressing   Problem: Health  Behavior/Discharge Planning: Goal: Ability to identify and utilize available resources and services will improve Outcome: Progressing Goal: Ability to manage health-related needs will improve Outcome: Progressing   Problem: Metabolic: Goal: Ability to maintain appropriate glucose levels will improve Outcome: Progressing   Problem: Nutritional: Goal: Maintenance of adequate nutrition will improve  Outcome: Progressing Goal: Progress toward achieving an optimal weight will improve Outcome: Progressing   Problem: Skin Integrity: Goal: Risk for impaired skin integrity will decrease Outcome: Progressing   Problem: Tissue Perfusion: Goal: Adequacy of tissue perfusion will improve Outcome: Progressing   Problem: Urinary Elimination: Goal: Signs and symptoms of infection will decrease Outcome: Progressing

## 2020-09-27 NOTE — Progress Notes (Signed)
CARE PLAN UPDATE:  Dr. Wynetta Emery was paged to bedside this evening for recurrent n/v. See his notes for details. Stat CT abdomen ordered 2500.  I evaluated the patient at bedside about an hour following his evaluation. Daughter was present at bedside to serve as interpreter. Pt noted to be teary eyed. She noted lower abdominal pain that was made worse when the mattress inflated/deflated. Daughter notes that pt feels better after vomiting. On exam, she is more acutely ill appearing than on prior exams. Her abdomen is fairly distended. Bowel sounds not appreciable on exam. laproscopic incisions appear to be healing and don't have surrounding erythema or drainage. Pain worsened with even light palpation of the lower abdomen.  Assessment: exam is concerning for an acute abdomen. Primary differentials are a bowel obstruction vs an acute bowel perforation. Clinical findings are not suggestive of an intraabdominal abscess or infectious process. Stat Abdominal CT from earlier has still not been performed 5 hours later, which is rather unfortunate considering our concerns for possible perforation. Night team discussed this with bedside RN who would speak with radiology  Plan -STAT abdominal CT (ordered at 1604 this evening) -NPO  Jamire Shabazz Darrick Meigs, MD Internal Medicine Resident PGY-2 [facility] Pager: [phone] 09/27/2020 9:03 PM

## 2020-09-27 NOTE — Progress Notes (Signed)
Subjective:  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  Overnight, no acute events.  This morning, patient reports that she has no abdominal pain.  Objective:  Vital signs in last 24 hours: Vitals:   09/25/20 2037 09/26/20 0300 09/26/20 1430 09/26/20 2154  BP: (!) 120/50 (!) 104/59 (!) 107/51 (!) 108/55  Pulse: 90 88 82 80  Resp: 18 17 18 17   Temp: (!) 97.5 F (36.4 C) (!) 97.4 F (36.3 C) (!) 97.3 F (36.3 C) 98 F (36.7 C)  TempSrc: Axillary Axillary Axillary Axillary  SpO2: 99% 94% 92% 93%  Weight:      Height:      SpO2: 93 % O2 Flow Rate (L/min): 2 L/min  Filed Weights   09/18/20 1158 09/21/20 0500  Weight: 90 kg 82.1 kg    Intake/Output Summary (Last 24 hours) at 09/27/2020 1349 Last data filed at 09/27/2020 1000 Gross per 24 hour  Intake 195 ml  Output 225 ml  Net -30 ml   Physical Exam Vitals and nursing note reviewed. Exam conducted with a chaperone present.  Constitutional:      General: She is not in acute distress.    Appearance: She is ill-appearing.     Comments: Actively vomiting   HENT:     Head: Normocephalic and atraumatic.  Cardiovascular:     Rate and Rhythm: Normal rate and regular rhythm.     Heart sounds: Normal heart sounds.  Pulmonary:     Effort: Pulmonary effort is normal. No respiratory distress.     Breath sounds: No rales.  Abdominal:     General: Abdomen is flat. Bowel sounds are normal. There is distension.     Palpations: Abdomen is  soft.     Tenderness: There is no abdominal tenderness.  Musculoskeletal:     Right lower leg: 3+ Pitting Edema present.     Left lower leg: 3+ Pitting Edema present.  Skin:    General: Skin is warm and dry.     Capillary Refill: Capillary refill takes less than 2 seconds.  Neurological:     General: No focal deficit present.     Mental Status: She is alert and oriented to person, place, and time.  Psychiatric:        Mood and Affect: Mood normal.        Behavior: Behavior normal.    CBC Latest Ref Rng & Units 09/27/2020 09/26/2020 09/25/2020  WBC 4.0 - 10.5 K/uL 5.9 5.9 6.0  Hemoglobin 12.0 - 15.0 g/dL 8.3(L) 8.2(L) 8.0(L)  Hematocrit 36 - 46 % 27.1(L) 27.0(L) 26.4(L)  Platelets 150 - 400 K/uL 118(L) 107(L) 92(L)   CMP Latest Ref Rng & Units 09/27/2020 09/26/2020 09/25/2020  Glucose 70 - 99 mg/dL 174(H) 203(H) 236(H)  BUN 8 - 23 mg/dL 29(H) 27(H) 28(H)  Creatinine 0.44 - 1.00 mg/dL 1.72(H) 1.72(H) 1.79(H)  Sodium 135 - 145 mmol/L 143 144 141  Potassium 3.5 - 5.1 mmol/L 4.4 4.4 5.0  Chloride 98 - 111 mmol/L 112(H) 113(H) 109  CO2 22 - 32 mmol/L 24 25 22   Calcium 8.9 - 10.3 mg/dL 7.9(L)  7.9(L) 7.7(L)  Total Protein 6.5 - 8.1 g/dL - - -  Total Bilirubin 0.3 - 1.2 mg/dL - - -  Alkaline Phos 38 - 126 U/L - - -  AST 15 - 41 U/L - - -  ALT 0 - 44 U/L - - -   Urine culture: ->100,000 ESBL producing klebsiella pneumoniae  -Sensitive to ciprofloxacin, imipenem, piperacillin/tazobactam  -Resistant to ampicillin, cefazolin, cefepime, ceftriaxone, gentamicin, nitrofurantoin, trimethoprim/sulfamethoxazole, ampicillin/sulbactam  Blood cultures collected 11/2: No growth for three days  DG Abd 1 View  Result Date: 09/25/2020 CLINICAL DATA:  Abdominal pain and distension EXAM: ABDOMEN - 1 VIEW COMPARISON:  August 22, 2020 FINDINGS: Double-J stent present on the left. There is contrast in portions of stomach and colon. No bowel dilatation or air-fluid level to suggest bowel obstruction.  No free air. Total hip replacement on the left. Inferior vena cava present. IMPRESSION: No bowel obstruction or free air. Double-J stent again noted on the left. Total hip replacement on the left. Filter in inferior vena cava. Electronically Signed   By: Lowella Grip III M.D.   On: 09/25/2020 09:51   Assessment/Plan:  Principal Problem:   Acute appendicitis Active Problems:   Protein-calorie malnutrition, severe ([place])   DNR (do not resuscitate) discussion   S/P appendectomy   Urinary tract infection due to ESBL Klebsiella  [name] is a 85 year old female with past medical history of IDDM, CKD3, hypoalbuminemia, chronic right hip fracture, recent admission for emphysematous cystitis and left-sided emphysematous pyelitis with ESBL klebsiella bacteremia who presented to [facility] on 11/08 with right lower quadrant abdominal pain found to have acute appendicitis s/p appendectomy on 11/08 with hospital course complicated by continued ESBL producing klebsiella pneumoniae urinary tract infection despite previously completed treatment course with meropenem.  #ESBL producing Klebsiella Pneumoniae UTI, active Following patient's appendectomy, patient continued to endorse suprapubic and abdominal pain. Urinalysis obtained was concerning for urinary tract infection with follow-up urine cultures showing continued growth >100,000 ESBL producing Klebsiella Pneumoniae sensitive to imipenem, ciprofloxacin, and piperacillin/tazobactam. She was started on meropenem on 11/10 per pharmacy dosing. As patient has continued ESBL producing Klebsiella Pneumoniae since her prior hospitalization, where she was treated with two-week course of meropenem, infectious disease was consulted for further suggestions and management. Patient's pain, WBC, and blood pressure stabilizing with continuation of antibiotics. -Appreciate ID recommendations  -Continue meropenem 1g IV Q12H for 10 days of total therapy (currently day  8/10) -Appreciate urology recommendations  -Removal left ureteral stent on 10/12/2020  -Foley to gravity -Daily CBC -Zofran PRN  #AoCKD 3a, active, improving Patient's creatinine trended up from baseline of 1.3 to 2.12, now trending down to 1.72 most recently. CT Abdomen/Pelvis on 09/20/20 demonstrated retention of IV contrast from initial CT scan on 11/08 consistent with renal insufficiency/nephro toxicity. AKI likely secondary to injury from contrast, urinary tract infection as well as intravascular volume depletion. -Continue meropenem -Monitor creatinine on daily BMP  #Goals of care Patient is a chronically-ill, bed-bound, very pleasant, elderly female who lives at home with her daughter who is her sole caregiver. Patient was recently admitted to our service for emphysematous cystitis and emphysematous left pyelitis and underwent urological surgical procedure with stent placement. During prior admission, patient's daughter requested patient to be DNR. Following discharge, patient reportedly in significant pain at home and often crying throughout the night. Patient's daughter expressed that she desires for her mother to be comfortable. Patient returned to our service with change in code status to Full code and underwent  appendectomy. Patient continuing hospitalization due to urinary tract infection and acute kidney injury. Patient expressed to our team directly her desire to die peacefully and not pursue aggressive treatment following completion of antibiotic therapy, however patient's daughter desires aggressive interventions including additional surgical intervention if necessary and continued Full Code. Due to discrepancy between patient's goals of care and family's goals of care palliative care team has been consulted, and we truly appreciate their service and recommendations. -Palliative Care team following, appreciate recommendations:  -Transitions of care outpatient palliative support   -Transitions of care financial support  -Chaplain following  -Patient's daughter interested in [facility] palliative services at home after discharge  -follow-up documentation of palliative care meeting with [place] translator from this morning  #Anasarca, chronic #Hypoalbuminemia, chronic #Cirrhosis, chronic Patient has anasarca in the setting of her profound hypoalbuminemia. During patient's appendectomy, her liver was noticed to have a nodular texture concerning for cirrhosis. LFTs within normal limits, however alkaline phosphatase mildly elevated. INR elevated. Patient with reactive hepatitis B core antibody and surface antibody suggesting prior hepatitis B infection. Pending patient's desires, she may benefit from further workup of this condition in the outpatient setting with gastroenterology. -Consider follow-up with gastroenterology in outpatient setting if in line with patient's goals of care.  #Arrythmia, active Patient developed episode of atrial fibrillation with RVR on the evening of 11/11 without subjective complaints of palpitations, chest pain, shortness of breath, dizziness or lightheadedness. Telemetry revealed findings concerning for second-degree Mobitz type 1 versus type 2 block. EKG obtained which was largely indeterminate, however concern for prolonged PR interval suggesting first-degree heart block. Patient's arrhythmia likely secondary to her multiple ongoing acute and chronic conditions. Due to her overall condition, the risks of anticoagulation at this time outweigh the benefit. -Continue to monitor on telemetry  #Poor oral intake, active For several months, patient has had poor oral intake. RD consulted and provided recommendations. SLP evaluated, performed barium swallow and recommending nectar thick liquids. Patient is at a very high risk for aspiration as she consumes all fluids and solid food while lying supine. -Boost supplementation TID (although patient often  refusing) -Carb modified diet now, nectar thick liquids  #Appendicitis s/p appendectomy Patient presented with right lower quadrant abdominal pain with evidence of early appendicitis on CT abdomen/pelvis. Patient underwent appendectomy with general surgery on 27/07 without complication. The appendix was noted to be mildly inflamed and sent to pathology for further evaluation. Surgical pathology evaluation consistent with acute appendicitis. -Acetaminophen 650mg  Q6H -Oxycodone 5mg  Q4H PRN -Morphine 1-4mg  Q4H PRN for breakthrough pain -Zofran 4mg  Q6H PRN  #IDDM, chronic Patient initially on sensitive SSI with escalation to moderate on 11/15. -Continue moderate SSI -CBGs  #Asymptomatic external hemorrhoids, chronic -Continue topical hydrocortisone cream twice daily  #VTE ppx: Lovenox 30mg  daily #Diet: Carb modified, nectar thick #Bowel regimen: Scheduled daily senna and miralax #Code status: Full code (appreciate palliative care consultation and support) #PT/OT recs: No PT/OT follow up; supervision/assistance - 24 hour  Cato Mulligan, MD 09/27/2020, 1:49 PM Pager: [phone] After 5pm on weekdays and 1pm on weekends: On Call pager [phone]

## 2020-09-27 NOTE — Progress Notes (Signed)
Patient ID: Whitney [name], female   DOB: 1935-09-03, 85 y.o.   MRN: [id number]  This nurse practitioner reviewed medical records and received report from team before meeting patient at bedside along with [place] interpreter and chaplain.  85 year old female with past medical history of IDDM, CKD3, hypoalbuminemia, chronic right hip fracture, recent admission for emphysematous cystitis and left-sided emphysematous pyelitis with ESBL klebsiella bacteremia who presented to [facility] on 11/08 with right lower quadrant abdominal pain found to have acute appendicitis s/p appendectomy on 11/08.  1. Seen on CT A/P 08/17/2020.S/p cystoscopy, left ureteral stent placement on 08/18/2020.CT A/P 08/20/2020 with decompressed left renal collecting system with no gas in the collecting system and bladder decompressed around a Foley catheter with no further gas within the bladder.Received prolonged course of antibiotics and ultimately discharged home. Represented with ESWL Klebsiella.CT A/P 09/20/2020 revealed left ureteral stent placement in appropriate position with no hydronephrosis and collapsed bladder around a Foley catheter.  Patient remains weak today with new onset nausea and vomiting, overall failure to thrive.   Attending team continues to assess and treat.  Currently a CT of abdomen and pelvis is ordered, patient is n.p.o., receiving IV Zosyn for nausea.   I met with daughter at bedsdie.    MOST form completed to reflect openness to all life prolonging measures.  Again I raised awareness to the difference between aggressive medical intervention path and a palliative comfort care for this patient at this time.  Daughter reiterates that as a family they remain hopeful for improvement and are open to all life prolonging measures.  Education offered on concepts of adult failure to thrive and the patient's high risk for decompensation.  Raised awareness to the importance of continued conversation with  family and  the  medical providers regarding overall plan of care and treatment options,  ensuring decisions are within the context of the patients values and GOCs.  Questions and concerns addressed   PMT will continue to support holistically.  Total time spent on the unit was 45 minutes  Greater than 50% of the time was spent in counseling and coordination of care  Wadie Lessen NP  Palliative Medicine Team Team Phone # [phone] Pager [phone]

## 2020-09-27 NOTE — Progress Notes (Signed)
SLP Cancellation Note  Patient Details Name: Elida Harbin MRN: [id number] DOB: 17-Aug-1935   Cancelled treatment:       Reason Eval/Treat Not Completed: Other (comment) Pt receiving nursing care when SLP tried to see her (getting bathed). Will f/u as able.    [name]., M.A. [facility] Pager [phone] Office [phone]  09/27/2020, 10:48 AM

## 2020-09-28 ENCOUNTER — Inpatient Hospital Stay (HOSPITAL_COMMUNITY): Payer: 59

## 2020-09-28 LAB — GLUCOSE, CAPILLARY
Glucose-Capillary: 151 mg/dL — ABNORMAL HIGH (ref 70–99)
Glucose-Capillary: 126 mg/dL — ABNORMAL HIGH (ref 70–99)
Glucose-Capillary: 129 mg/dL — ABNORMAL HIGH (ref 70–99)
Glucose-Capillary: 119 mg/dL — ABNORMAL HIGH (ref 70–99)

## 2020-09-28 LAB — COMPREHENSIVE METABOLIC PANEL
ALT: 10 U/L (ref 0–44)
AST: 23 U/L (ref 15–41)
Albumin: 1.5 g/dL — ABNORMAL LOW (ref 3.5–5.0)
Alkaline Phosphatase: 245 U/L — ABNORMAL HIGH (ref 38–126)
Anion gap: 9 (ref 5–15)
BUN: 32 mg/dL — ABNORMAL HIGH (ref 8–23)
CO2: 21 mmol/L — ABNORMAL LOW (ref 22–32)
Calcium: 7.8 mg/dL — ABNORMAL LOW (ref 8.9–10.3)
Chloride: 115 mmol/L — ABNORMAL HIGH (ref 98–111)
Creatinine, Ser: 1.81 mg/dL — ABNORMAL HIGH (ref 0.44–1.00)
GFR, Estimated: 27 mL/min — ABNORMAL LOW (ref >60)
Glucose, Bld: 175 mg/dL — ABNORMAL HIGH (ref 70–99)
Potassium: 4.4 mmol/L (ref 3.5–5.1)
Sodium: 145 mmol/L (ref 135–145)
Total Bilirubin: 0.9 mg/dL (ref 0.3–1.2)
Total Protein: 5.6 g/dL — ABNORMAL LOW (ref 6.5–8.1)

## 2020-09-28 LAB — MAGNESIUM: Magnesium: 1.7 mg/dL (ref 1.7–2.4)

## 2020-09-28 LAB — GAMMA GT: GGT: 110 U/L — ABNORMAL HIGH (ref 7–50)

## 2020-09-28 MED ORDER — MORPHINE SULFATE (PF) 4 MG/ML IV SOLN
3.0000 mg | Freq: Once | INTRAVENOUS | Status: AC
  Administered 2020-09-28: 3 mg via INTRAVENOUS
  Filled 2020-09-28: qty 1

## 2020-09-28 MED ORDER — TECHNETIUM TC 99M MEBROFENIN IV KIT
5.3500 | PACK | Freq: Once | INTRAVENOUS | Status: AC | PRN
  Administered 2020-09-28: 5.35 via INTRAVENOUS

## 2020-09-28 MED ORDER — LACTATED RINGERS IV SOLN: INTRAVENOUS | Status: DC

## 2020-09-28 MED ORDER — ALBUMIN HUMAN 25 % IV SOLN
25.0000 g | Freq: Once | INTRAVENOUS | Status: AC
  Administered 2020-09-28: 25.5 g via INTRAVENOUS
  Filled 2020-09-28: qty 100

## 2020-09-28 NOTE — Progress Notes (Signed)
Progress Note  10 Days Post-Op  Subjective: Called by internal medicine teaching service to re-evaluate patient for possible cholecystitis. Reportedly having more RUQ pain and nausea and vomiting. Palliative is following as well, patient and family currently desire maximal medical interventions available to them.   Objective: Vital signs in last 24 hours: Temp:  [97.7 F (36.5 C)-97.9 F (36.6 C)] 97.9 F (36.6 C) (11/18 0604) Pulse Rate:  [98-105] 98 (11/18 0604) Resp:  [16-17] 17 (11/18 0604) BP: (130-132)/(56-58) 130/56 (11/18 0604) SpO2:  [88 %-100 %] 100 % (11/18 0604) Last BM Date: 09/27/20  Intake/Output from previous day: 11/17 0701 - 11/18 0700 In: 150 [P.O.:150] Out: 625 [Urine:500; Emesis/NG output:125] Intake/Output this shift: No intake/output data recorded.  PE: General: WD, obese female who is laying in bed HEENT: Sclera are anicteric. Poor dentition Heart: regular, rate, and rhythm.  Lungs:  Respiratory effort nonlabored Abd: soft, ttp in RUQ, mildly distended, +BS, incisions c/d/i MS: 3+ pitting edema in BLE    Lab Results:  Recent Labs    09/26/20 0638 09/27/20 0405  WBC 5.9 5.9  HGB 8.2* 8.3*  HCT 27.0* 27.1*  PLT 107* 118*   BMET Recent Labs    09/27/20 0405 09/28/20 0307  NA 143 145  K 4.4 4.4  CL 112* 115*  CO2 24 21*  GLUCOSE 174* 175*  BUN 29* 32*  CREATININE 1.72* 1.81*  CALCIUM 7.9* 7.8*   PT/INR No results for input(s): LABPROT, INR in the last 72 hours. CMP     Component Value Date/Time   NA 145 09/28/2020 0307   K 4.4 09/28/2020 0307   CL 115 (H) 09/28/2020 0307   CO2 21 (L) 09/28/2020 0307   GLUCOSE 175 (H) 09/28/2020 0307   BUN 32 (H) 09/28/2020 0307   CREATININE 1.81 (H) 09/28/2020 0307   CALCIUM 7.8 (L) 09/28/2020 0307   PROT 5.6 (L) 09/28/2020 0307   ALBUMIN 1.5 (L) 09/28/2020 0307   AST 23 09/28/2020 0307   ALT 10 09/28/2020 0307   ALKPHOS 245 (H) 09/28/2020 0307   BILITOT 0.9 09/28/2020 0307    GFRNONAA 27 (L) 09/28/2020 0307   GFRAA 42 (L) 05/27/2020 1033   Lipase     Component Value Date/Time   LIPASE 14 09/18/2020 0800       Studies/Results: CT ABDOMEN PELVIS WO CONTRAST  Result Date: 09/28/2020 CLINICAL DATA:  Bowel obstruction, nausea, vomiting EXAM: CT ABDOMEN AND PELVIS WITHOUT CONTRAST TECHNIQUE: Multidetector CT imaging of the abdomen and pelvis was performed following the standard protocol without IV contrast. COMPARISON:  09/20/2020 FINDINGS: Lower chest: Small to moderate bilateral pleural effusions are present, similar to prior examination. There is associated mild bibasilar atelectasis. Mild right coronary artery calcification. Calcification of the vital valve annulus noted. Global cardiac size is at the upper limits of normal. Hepatobiliary: Liver unremarkable. The gallbladder is mildly distended and there is mild pericholecystic fluid, nonspecific in the setting of ascites, but subtle gallbladder wall thickening also noted. Cholelithiasis noted. Together, the findings are suspicious for changes of acute cholecystitis. These findings, however, are stable since prior examination. No intra or extrahepatic biliary ductal dilation. Pancreas: Atrophic, but otherwise unremarkable Spleen: Unremarkable Adrenals/Urinary Tract: The adrenal glands are unremarkable. The kidneys are normal in size and position. Simple cortical cyst noted within the left kidney. Left ureteral stent is in place extending from the left upper pole calices to the bladder. There is no hydronephrosis. No nephro or urolithiasis. The bladder is decompressed with a Foley  catheter balloon seen within its lumen. Stomach/Bowel: The stomach is mildly distended, similar to that noted on prior examination, nonspecific. This can be seen the setting of mild gastric atony. Contrast from a prior swallowing study has passed into the colon, arguing against an underlying obstruction. The small and large bowel are unremarkable.  The appendix is normal. Mild ascites. No free intraperitoneal gas. Vascular/Lymphatic: Infrarenal inferior vena cava filter is unchanged, demonstrating moderate strut penetration. No aortic aneurysm. Arteriosclerosis of the a visualized lower extremity arterial outflow noted. No aortic aneurysm. No pathologic abdominal or pelvic adenopathy. Reproductive: Uterus and bilateral adnexa are unremarkable. Other: There is diffuse subcutaneous body wall edema, progressive since prior examination. Musculoskeletal: Chronic osteolysis of the right femoral head and dislocation of the right hip is again noted. Revision type left hip arthroplasty has been performed with methylmethacrylate reconstruction of the acetabular cup. Marked protrusio again identified. Degenerative changes are seen within the lumbar spine. Stable remote L1 compression fracture. No lytic or blastic bone lesion is seen. Dystrophic calcification is seen within the soft tissues posterior to the left iliac spine likely related to remote trauma or inflammation. This is unchanged. IMPRESSION: Increasing anasarca with increasing ascites and progressive body wall edema. Small Bob lateral pleural effusions appears stable. Stable findings of gallbladder distension, subtle gallbladder wall thickening, and cholelithiasis suspicious for changes of acute cholecystitis. These appears stable since prior examination. Given the findings of a recent sonogram of 09/21/2020, hepatic biliary scintigraphy could be considered for evaluation of cystic duct patency. Additional incidental findings as noted above. Aortic Atherosclerosis (ICD10-I70.0). Electronically Signed   By: Fidela Salisbury MD   On: 09/28/2020 02:44    Anti-infectives: Anti-infectives (From admission, onward)   Start     Dose/Rate Route Frequency Ordered Stop   09/20/20 1200  meropenem (MERREM) 1 g in sodium chloride 0.9 % 100 mL IVPB        1 g 200 mL/hr over 30 Minutes Intravenous Every 12 hours 09/20/20  1101 09/29/20 2359   09/18/20 1115  cefTRIAXone (ROCEPHIN) 2 g in sodium chloride 0.9 % 100 mL IVPB        2 g 200 mL/hr over 30 Minutes Intravenous  Once 09/18/20 1103 09/18/20 1238   09/18/20 1115  metroNIDAZOLE (FLAGYL) IVPB 500 mg        500 mg 100 mL/hr over 60 Minutes Intravenous  Once 09/18/20 1103 09/18/20 1309       Assessment/Plan CKD stage III - cr 1.81 IDDM Osteoporosis Bedbound Chronically dislocated right hip Cirrhosis Hep B 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 Ninfa Linden Persistent nausea and vomiting  ?cholecystitis  - surgical path consistent with acute appendicitis. However, she was noted to have a thin white fluid noted on the right side of her abdomen during surgery of unclear etiology.  - pt has had increased n/v post op and been complaining of more RUQ pain now - WBC normal and LFTs not elevated  - RUQ [place] and CT show some distention of gallbladder, minimal gallbladder wall thickening and cholelithiasis - this is all non-specific in a patient with cirrhosis - can check HIDA to try to r/o cholecystitis although this can also be non-specific in a patient with cirrhosis  ID - rocephin/flagylpreop; merrem 11/10>> FEN -NPO for HIDA VTE - SCDs, Lovenox Foley - none  LOS: 10 days    Norm Parcel ,  PA-C [facility] 09/28/2020, 10:09 AM Please see Amion for pager number during day hours 7:00am-4:30pm

## 2020-09-28 NOTE — Progress Notes (Signed)
[facility] ([facility] Community [facility]   °    °This patient has been referred to our palliative care services in the community.  [facility] will continue to follow for any discharge planning needs and to coordinate admission onto palliative care.   °If you have questions or need assistance, please call [phone] or contact the hospital Liaison listed on AMION.    ° °Thank you for the opportunity to participate in this patient’s care. °    °Chrislyn King, BSN, RN °[facility] Hospital Liaison   °[phone]  ° °

## 2020-09-28 NOTE — Progress Notes (Signed)
  Paged 9:10pm by RN reporting patient's persistent N/V and inability to tolerate oral contrast for CT abdomen pelvis despite PRN IV Zofran administration at 8pm. Ordered additional one-time 4 mg IV Zofran dose which was administered at 9:27pm.  Paged 10:12 pm by RN reporting patient's daughter at bedside concerned about patient's ongoing discomfort. RN notes that patient is tolerating oral contrast, albeit slowly, and has had less emesis. Patient evaluated at bedside.  SUBJECTIVE: Patient's daughter at bedside and expresses concern that the patient has not yet had CT abdomen pelvis. Expresses dissatisfaction with delay in care. Patient endorses ongoing nausea though improved from prior. Abdominal pain also improved from prior. Patient has had 3/4 oral contrast.  OBJECTIVE: vitals reassuring, stable from prior, afebrile, BP 130s/50s, SpO2 >95% on room air. Patient tired-appearing however awake and alert. Spits into emesis bag several times during encounter. Emesis bag with ~1 cup clear, yellow-tinged liquid. Abdomen fairly distended, bowel sounds hypoactive. Generalized abdominal tenderness to light palpation. Laparoscopic incisions without surrounding erythema or drainage.   ASSESSMENT: Patient's nausea has improved s/p 4 mg IV Zofran x 2, emesis persistent however decreased in volume. Exam appears consistent with Dr. Chase Picket exam. She is mostly tolerating 1 bottle of oral contrast. Per RN, patient to be taken for CT abdomen pelvis 30 mins following completion of oral contrast.   PLAN: CT abdomen pelvis pending oral contrast intake.

## 2020-09-28 NOTE — Plan of Care (Signed)
Patient NPO due to emesis. Daughter updated on patient status and NPO.   Problem: Education: Goal: Knowledge of General Education information will improve Description: Including pain rating scale, medication(s)/side effects and non-pharmacologic comfort measures Outcome: Not Progressing   Problem: Health Behavior/Discharge Planning: Goal: Ability to manage health-related needs will improve Outcome: Not Progressing   Problem: Clinical Measurements: Goal: Ability to maintain clinical measurements within normal limits will improve Outcome: Not Progressing Goal: Will remain free from infection Outcome: Not Progressing Goal: Diagnostic test results will improve Outcome: Not Progressing Goal: Respiratory complications will improve Outcome: Not Progressing Goal: Cardiovascular complication will be avoided Outcome: Not Progressing   Problem: Activity: Goal: Risk for activity intolerance will decrease Outcome: Not Progressing   Problem: Nutrition: Goal: Adequate nutrition will be maintained Outcome: Not Progressing   Problem: Coping: Goal: Level of anxiety will decrease Outcome: Not Progressing   Problem: Elimination: Goal: Will not experience complications related to bowel motility Outcome: Not Progressing Goal: Will not experience complications related to urinary retention Outcome: Not Progressing   Problem: Pain Managment: Goal: General experience of comfort will improve Outcome: Not Progressing   Problem: Safety: Goal: Ability to remain free from injury will improve Outcome: Not Progressing   Problem: Skin Integrity: Goal: Risk for impaired skin integrity will decrease Outcome: Not Progressing   Problem: Education: Goal: Required Educational Video(s) Outcome: Not Progressing   Problem: Clinical Measurements: Goal: Postoperative complications will be avoided or minimized Outcome: Not Progressing   Problem: Skin Integrity: Goal: Demonstration of wound healing  without infection will improve Outcome: Not Progressing   Problem: Education: Goal: Ability to describe self-care measures that may prevent or decrease complications (Diabetes Survival Skills Education) will improve Outcome: Not Progressing Goal: Individualized Educational Video(s) Outcome: Not Progressing   Problem: Coping: Goal: Ability to adjust to condition or change in health will improve Outcome: Not Progressing   Problem: Fluid Volume: Goal: Ability to maintain a balanced intake and output will improve Outcome: Not Progressing   Problem: Health Behavior/Discharge Planning: Goal: Ability to identify and utilize available resources and services will improve Outcome: Not Progressing Goal: Ability to manage health-related needs will improve Outcome: Not Progressing   Problem: Metabolic: Goal: Ability to maintain appropriate glucose levels will improve Outcome: Not Progressing   Problem: Nutritional: Goal: Maintenance of adequate nutrition will improve Outcome: Not Progressing Goal: Progress toward achieving an optimal weight will improve Outcome: Not Progressing   Problem: Skin Integrity: Goal: Risk for impaired skin integrity will decrease Outcome: Not Progressing   Problem: Tissue Perfusion: Goal: Adequacy of tissue perfusion will improve Outcome: Not Progressing   Problem: Urinary Elimination: Goal: Signs and symptoms of infection will decrease Outcome: Not Progressing

## 2020-09-28 NOTE — Progress Notes (Signed)
Subjective:   Overnight, patient had continued nausea, vomiting and abdominal pain. CT Abdomen/Pelvis was completed which revealed findings concerning for acute cholecystitis as well as worsening anasarca and ascites.  This morning, patient endorses pain in her abdomen however she cannot elicit exactly where the pain is most severe. Additionally, she endorses bilateral lower extremity pain.  Objective:  Vital signs in last 24 hours: Vitals:   09/26/20 2154 09/27/20 1320 09/27/20 2125 09/28/20 0604  BP: (!) 108/55 (!) 131/56 (!) 132/58 (!) 130/56  Pulse: 80 (!) 105 100 98  Resp: 17 16 17 17   Temp: 98 F (36.7 C) 97.7 F (36.5 C) 97.9 F (36.6 C) 97.9 F (36.6 C)  TempSrc: Axillary Axillary Oral Axillary  SpO2: 93% (!) 88% 100% 100%  Weight:      Height:      SpO2: 100 % O2 Flow Rate (L/min): 1 L/min  Filed Weights   09/18/20 1158 09/21/20 0500  Weight: 90 kg 82.1 kg    Intake/Output Summary (Last 24 hours) at 09/28/2020 1243 Last data filed at 09/28/2020 0651 Gross per 24 hour  Intake 75 ml  Output 525 ml  Net -450 ml   Physical Exam Vitals and nursing note reviewed. Exam conducted with a chaperone present.  Constitutional:      General: She is not in acute distress.    Appearance: She is ill-appearing.  HENT:     Head: Normocephalic and atraumatic.  Cardiovascular:     Rate and Rhythm: Normal rate and regular rhythm.     Heart sounds: Normal heart sounds.  Pulmonary:     Effort: Pulmonary effort is normal. No respiratory distress.     Breath sounds: No rales.  Abdominal:     General: Abdomen is flat. Bowel sounds are decreased. There is distension.     Palpations: Abdomen is soft.     Tenderness: There is abdominal tenderness in the right upper quadrant and epigastric area.     Comments: Mild tenderness to palpation  Musculoskeletal:     Right lower leg: 3+ Pitting Edema present.     Left lower leg: 3+ Pitting Edema present.  Skin:    General: Skin is  warm and dry.     Capillary Refill: Capillary refill takes less than 2 seconds.  Neurological:     General: No focal deficit present.     Mental Status: She is alert and oriented to person, place, and time.  Psychiatric:        Mood and Affect: Mood normal.        Behavior: Behavior normal.    CBC Latest Ref Rng & Units 09/27/2020 09/26/2020 09/25/2020  WBC 4.0 - 10.5 K/uL 5.9 5.9 6.0  Hemoglobin 12.0 - 15.0 g/dL 8.3(L) 8.2(L) 8.0(L)  Hematocrit 36 - 46 % 27.1(L) 27.0(L) 26.4(L)  Platelets 150 - 400 K/uL 118(L) 107(L) 92(L)   CMP Latest Ref Rng & Units 09/28/2020 09/27/2020 09/26/2020  Glucose 70 - 99 mg/dL 175(H) 174(H) 203(H)  BUN 8 - 23 mg/dL 32(H) 29(H) 27(H)  Creatinine 0.44 - 1.00 mg/dL 1.81(H) 1.72(H) 1.72(H)  Sodium 135 - 145 mmol/L 145 143 144  Potassium 3.5 - 5.1 mmol/L 4.4 4.4 4.4  Chloride 98 - 111 mmol/L 115(H) 112(H) 113(H)  CO2 22 - 32 mmol/L 21(L) 24 25  Calcium 8.9 - 10.3 mg/dL 7.8(L) 7.9(L) 7.9(L)  Total Protein 6.5 - 8.1 g/dL 5.6(L) - -  Total Bilirubin 0.3 - 1.2 mg/dL 0.9 - -  Alkaline Phos 38 -  126 U/L 245(H) - -  AST 15 - 41 U/L 23 - -  ALT 0 - 44 U/L 10 - -   GGT - 110  CT ABDOMEN PELVIS WO CONTRAST IMPRESSION: Increasing anasarca with increasing ascites and progressive body wall edema. Small Bob lateral pleural effusions appears stable. Stable findings of gallbladder distension, subtle gallbladder wall thickening, and cholelithiasis suspicious for changes of acute cholecystitis. These appears stable since prior examination. Given the findings of a recent sonogram of 09/21/2020, hepatic biliary scintigraphy could be considered for evaluation of cystic duct patency. Additional incidental findings as noted above. Aortic Atherosclerosis    Assessment/Plan:  Principal Problem:   Acute appendicitis Active Problems:   Protein-calorie malnutrition, severe ([place])   DNR (do not resuscitate) discussion   S/P appendectomy   Urinary tract infection due to ESBL  Klebsiella   Adult failure to thrive  Whitney [name] is a 85 year old female with past medical history of IDDM, CKD3, hypoalbuminemia, chronic right hip fracture, recent admission for emphysematous cystitis and left-sided emphysematous pyelitis with ESBL klebsiella bacteremia who presented to [facility] on 11/08 with right lower quadrant abdominal pain found to have acute appendicitis s/p appendectomy on 11/08 with hospital course complicated by continued ESBL producing klebsiella pneumoniae urinary tract infection despite previously completed treatment course with meropenem as well as acute cholecystitis.  #Acute cholecystitis, active Patient with progressively worsening abdominal pain, nausea and vomiting the past few days. Radiologic evidence concerning for acute cholecystitis with elevated alkaline phosphatase and GGT on morning labs. Given patient's symptoms, laboratory findings, and imaging findings, she would benefit from surgical consultation for evaluation and consideration for cholecystectomy. -Consult surgery, appreciate recommendations:  -Obtain HIDA today  -NPO  -IV zofran PRN  #ESBL producing Klebsiella Pneumoniae UTI, active Patient has ongoing urinary tract infection with ESBL producing klebsiella pneumoniae following previous two week course of meropenem during prior hospitalization. She is currently on day nine of a ten day additional course of meropenem per infectious disease consultation recommendations. Patient does not have suprapubic tenderness on examination today, no leukocytosis, no fevers. Urology following and recommending removal of stent following hospitalization. -Appreciate ID recommendations  -Continue meropenem 1g IV Q12H for 10 days of total therapy (currently day 9/10) -Appreciate urology recommendations  -Removal left ureteral stent on 10/12/2020  -Foley to gravity -Daily CBC -Zofran PRN  #AoCKD 3a, active, stable Patient's creatinine elevated to 1.81 up from  baseline of 1.3. Patient's AKI likely multifactorial in the setting of intravascular volume depletion, poor oral intake, and urinary tract infection. Unfortunately, patient's significantly low albumin limits the ability to provide IVF without causing additional extravasation of fluids. -Continue meropenem -Monitor creatinine on daily BMP  #Goals of care Patient has clearly stated to our team that she does not want aggressive interventions and would like to pass peacefully, however she defers to her daughter for medical decision making. Following extensive conversations with daughter by the palliative care team, IMTS, general surgery team, and chaplain, patient's daughter still desires patient to be Full Code and for patient to undergo aggressive medical and surgical interventions. -Palliative Care team following, appreciate recommendations:  -Transitions of care outpatient palliative support  -Transitions of care financial support  -Chaplain following  -[facility] palliative services upon discharge  #Anasarca, chronic #Hypoalbuminemia, chronic #Cirrhosis, chronic Patient remains significantly volume overloaded on examination in the setting of her profound hypoalbuminemia. Patient has underlying cirrhosis of undetermined etiology, however hepatitis panel reveals evidence of prior hepatitis B infection. -Consider outpatient GI follow-up upon discharge -Boost  supplementation TID  #IDDM, chronic Patient initially on sensitive SSI with escalation to moderate on 11/15. -Continue moderate SSI -CBGs  #Asymptomatic external hemorrhoids, chronic -Continue topical hydrocortisone cream twice daily  #VTE ppx: Lovenox 30mg  daily #Diet: Carb modified, nectar thick #Bowel regimen: Scheduled daily senna and miralax #Code status: Full code (appreciate palliative care consultation and support) #PT/OT recs: No PT/OT follow up; supervision/assistance - 24 hour  Cato Mulligan, MD 09/28/2020, 12:43 PM Pager:  [phone] After 5pm on weekdays and 1pm on weekends: On Call pager [phone]

## 2020-09-28 NOTE — Progress Notes (Signed)
SLP Cancellation Note  Patient Details Name: Whitney [name] MRN: [id number] DOB: 05/06/1935   Cancelled treatment:       Reason Eval/Treat Not Completed: Medical issues which prohibited therapy. Pt has been having N/V and has been made NPO per medical team. Will f/u for readiness to resume POs.    [name]., M.A. [facility] Pager [phone] Office [phone]  09/28/2020, 10:16 AM

## 2020-09-29 LAB — GLUCOSE, CAPILLARY
Glucose-Capillary: 110 mg/dL — ABNORMAL HIGH (ref 70–99)
Glucose-Capillary: 106 mg/dL — ABNORMAL HIGH (ref 70–99)
Glucose-Capillary: 104 mg/dL — ABNORMAL HIGH (ref 70–99)
Glucose-Capillary: 102 mg/dL — ABNORMAL HIGH (ref 70–99)

## 2020-09-29 LAB — CBC
HCT: 22 % — ABNORMAL LOW (ref 36.0–46.0)
Hemoglobin: 7 g/dL — ABNORMAL LOW (ref 12.0–15.0)
MCH: 29.4 pg (ref 26.0–34.0)
MCHC: 31.8 g/dL (ref 30.0–36.0)
MCV: 92.4 fL (ref 80.0–100.0)
Platelets: 148 10*3/uL — ABNORMAL LOW (ref 150–400)
RBC: 2.38 MIL/uL — ABNORMAL LOW (ref 3.87–5.11)
RDW: 15.8 % — ABNORMAL HIGH (ref 11.5–15.5)
WBC: 5.9 10*3/uL (ref 4.0–10.5)
nRBC: 0 % (ref 0.0–0.2)

## 2020-09-29 LAB — COMPREHENSIVE METABOLIC PANEL
ALT: 10 U/L (ref 0–44)
AST: 27 U/L (ref 15–41)
Albumin: 1.8 g/dL — ABNORMAL LOW (ref 3.5–5.0)
Alkaline Phosphatase: 257 U/L — ABNORMAL HIGH (ref 38–126)
Anion gap: 5 (ref 5–15)
BUN: 31 mg/dL — ABNORMAL HIGH (ref 8–23)
CO2: 26 mmol/L (ref 22–32)
Calcium: 7.8 mg/dL — ABNORMAL LOW (ref 8.9–10.3)
Chloride: 117 mmol/L — ABNORMAL HIGH (ref 98–111)
Creatinine, Ser: 1.74 mg/dL — ABNORMAL HIGH (ref 0.44–1.00)
GFR, Estimated: 29 mL/min — ABNORMAL LOW (ref >60)
Glucose, Bld: 124 mg/dL — ABNORMAL HIGH (ref 70–99)
Potassium: 4.2 mmol/L (ref 3.5–5.1)
Sodium: 148 mmol/L — ABNORMAL HIGH (ref 135–145)
Total Bilirubin: 1 mg/dL (ref 0.3–1.2)
Total Protein: 5.2 g/dL — ABNORMAL LOW (ref 6.5–8.1)

## 2020-09-29 LAB — HEMOGLOBIN AND HEMATOCRIT, BLOOD
HCT: 25.6 % — ABNORMAL LOW (ref 36.0–46.0)
Hemoglobin: 8.1 g/dL — ABNORMAL LOW (ref 12.0–15.0)

## 2020-09-29 LAB — PREPARE RBC (CROSSMATCH)

## 2020-09-29 MED ORDER — SODIUM CHLORIDE 0.9% IV SOLUTION: Freq: Once | INTRAVENOUS | Status: AC

## 2020-09-29 NOTE — Progress Notes (Signed)
Nutrition Follow-up  DOCUMENTATION CODES:   Obesity unspecified  INTERVENTION:   -D/c Boost Breeze -Continue MVI with minerals daily -RD will follow for diet advancement and add supplements as appropriate -If prolonged NPO status is anticipated and in line with pt's goals of care, consider initiation of nutrition support  NUTRITION DIAGNOSIS:   Inadequate oral intake related to poor appetite as evidenced by per patient/family report, meal completion < 25%.  Ongoing  GOAL:   Patient will meet greater than or equal to 90% of their needs  Unmet  MONITOR:   PO intake, Supplement acceptance  REASON FOR ASSESSMENT:   Consult Assessment of nutrition requirement/status  ASSESSMENT:   Pt with PMH of IDDM, CKD3, and recent admission for pyelonephritis (08/18/20). Admitted with RLQ abdominal pain found to have acute appendicitis. Appendectomy on 11/8.  11/11- s/p MBSS- downgraded to regular diet with nectar thick liquids  Reviewed I/O's: -295 ml x 24 hours and +2.1 L since admission  UOP: 700 ml x 24 hours   Emesis: 100 ml x 24 hours  Pt has been experiencing vomiting over the past 24 hours. CT of abdomen and pelvis ordered, which revealed distention of gallbladder, minimal gallbladder wall thickening and cholelithiasis. General surgery has been consulted and plan for HIDA scan to rule out cholecystitis.   Pt currently NPO. Pt with minimal intake prior to NPO status; noted meal completion 10-50%. She has been refusing Boost Breeze supplements. Pt daughter has also been bringing in culturally familiar foods to pt when she visits (pt will often eat some of the outside food provided).   Medications reviewed and include lovenox and miralax.   Labs reviewed: Na: 148, CBGS: 119-129 (inpatient orders for glycemic control are 0-15 units insulin aspart TID with meals and 0-5 units insulin aspart daily at bedtime).   Diet Order:   Diet Order            Diet NPO time specified  Diet  effective now                 EDUCATION NEEDS:   Education needs have been addressed  Skin:  Skin Assessment: Skin Integrity Issues: Skin Integrity Issues:: Incisions Stage II: - Incisions: closed abdomen  Last BM:  09/27/20  Height:   Ht Readings from Last 1 Encounters:  09/18/20 5\' 3"  (1.6 m)    Weight:   Wt Readings from Last 1 Encounters:  09/21/20 82.1 kg   BMI:  Body mass index is 32.06 kg/m.  Estimated Nutritional Needs:   Kcal:  1600-1800  Protein:  85-100 grams  Fluid:  >/= 1.6L/day    Loistine Chance, RD, LDN, [place] Registered Dietitian II Certified Diabetes Care and Education Specialist Please refer to [facility] for RD and/or RD on-call/weekend/after hours pager

## 2020-09-29 NOTE — Progress Notes (Signed)
Progress Note  11 Days Post-Op  Subjective: Patient reported some RUQ abdominal pain this AM. HIDA appears positive for cholecystitis. Discussed with patient's daughter who is agreeable for Percutaneous cholecystostomy tube.   Objective: Vital signs in last 24 hours: Temp:  [97.7 F (36.5 C)-98.1 F (36.7 C)] 97.7 F (36.5 C) (11/19 1003) Pulse Rate:  [86-95] 90 (11/19 1003) Resp:  [16-18] 18 (11/19 1003) BP: (108-113)/(56-74) 108/56 (11/19 1003) SpO2:  [97 %-100 %] 97 % (11/19 1003) Last BM Date: 09/27/20  Intake/Output from previous day: 11/18 0701 - 11/19 0700 In: 504.1 [I.V.:400; IV Piggyback:104.1] Out: 800 [Urine:700; Emesis/NG output:100] Intake/Output this shift: No intake/output data recorded.  PE: General: WD, obese female who is laying in bed HEENT: Sclera are anicteric. Poor dentition Heart: regular, rate, and rhythm.  Lungs:  Respiratory effort nonlabored Abd: soft, ttp in RUQ, mildly distended, +BS, incisions c/d/i MS: 3+ pitting edema in BLE   Lab Results:  Recent Labs    09/27/20 0405 09/29/20 0100  WBC 5.9 5.9  HGB 8.3* 7.0*  HCT 27.1* 22.0*  PLT 118* 148*   BMET Recent Labs    09/28/20 0307 09/29/20 0100  NA 145 148*  K 4.4 4.2  CL 115* 117*  CO2 21* 26  GLUCOSE 175* 124*  BUN 32* 31*  CREATININE 1.81* 1.74*  CALCIUM 7.8* 7.8*   PT/INR No results for input(s): LABPROT, INR in the last 72 hours. CMP     Component Value Date/Time   NA 148 (H) 09/29/2020 0100   K 4.2 09/29/2020 0100   CL 117 (H) 09/29/2020 0100   CO2 26 09/29/2020 0100   GLUCOSE 124 (H) 09/29/2020 0100   BUN 31 (H) 09/29/2020 0100   CREATININE 1.74 (H) 09/29/2020 0100   CALCIUM 7.8 (L) 09/29/2020 0100   PROT 5.2 (L) 09/29/2020 0100   ALBUMIN 1.8 (L) 09/29/2020 0100   AST 27 09/29/2020 0100   ALT 10 09/29/2020 0100   ALKPHOS 257 (H) 09/29/2020 0100   BILITOT 1.0 09/29/2020 0100   GFRNONAA 29 (L) 09/29/2020 0100   GFRAA 42 (L) 05/27/2020 1033   Lipase      Component Value Date/Time   LIPASE 14 09/18/2020 0800       Studies/Results: CT ABDOMEN PELVIS WO CONTRAST  Result Date: 09/28/2020 CLINICAL DATA:  Bowel obstruction, nausea, vomiting EXAM: CT ABDOMEN AND PELVIS WITHOUT CONTRAST TECHNIQUE: Multidetector CT imaging of the abdomen and pelvis was performed following the standard protocol without IV contrast. COMPARISON:  09/20/2020 FINDINGS: Lower chest: Small to moderate bilateral pleural effusions are present, similar to prior examination. There is associated mild bibasilar atelectasis. Mild right coronary artery calcification. Calcification of the vital valve annulus noted. Global cardiac size is at the upper limits of normal. Hepatobiliary: Liver unremarkable. The gallbladder is mildly distended and there is mild pericholecystic fluid, nonspecific in the setting of ascites, but subtle gallbladder wall thickening also noted. Cholelithiasis noted. Together, the findings are suspicious for changes of acute cholecystitis. These findings, however, are stable since prior examination. No intra or extrahepatic biliary ductal dilation. Pancreas: Atrophic, but otherwise unremarkable Spleen: Unremarkable Adrenals/Urinary Tract: The adrenal glands are unremarkable. The kidneys are normal in size and position. Simple cortical cyst noted within the left kidney. Left ureteral stent is in place extending from the left upper pole calices to the bladder. There is no hydronephrosis. No nephro or urolithiasis. The bladder is decompressed with a Foley catheter balloon seen within its lumen. Stomach/Bowel: The stomach is mildly distended,  similar to that noted on prior examination, nonspecific. This can be seen the setting of mild gastric atony. Contrast from a prior swallowing study has passed into the colon, arguing against an underlying obstruction. The small and large bowel are unremarkable. The appendix is normal. Mild ascites. No free intraperitoneal gas.  Vascular/Lymphatic: Infrarenal inferior vena cava filter is unchanged, demonstrating moderate strut penetration. No aortic aneurysm. Arteriosclerosis of the a visualized lower extremity arterial outflow noted. No aortic aneurysm. No pathologic abdominal or pelvic adenopathy. Reproductive: Uterus and bilateral adnexa are unremarkable. Other: There is diffuse subcutaneous body wall edema, progressive since prior examination. Musculoskeletal: Chronic osteolysis of the right femoral head and dislocation of the right hip is again noted. Revision type left hip arthroplasty has been performed with methylmethacrylate reconstruction of the acetabular cup. Marked protrusio again identified. Degenerative changes are seen within the lumbar spine. Stable remote L1 compression fracture. No lytic or blastic bone lesion is seen. Dystrophic calcification is seen within the soft tissues posterior to the left iliac spine likely related to remote trauma or inflammation. This is unchanged. IMPRESSION: Increasing anasarca with increasing ascites and progressive body wall edema. Small Bob lateral pleural effusions appears stable. Stable findings of gallbladder distension, subtle gallbladder wall thickening, and cholelithiasis suspicious for changes of acute cholecystitis. These appears stable since prior examination. Given the findings of a recent sonogram of 09/21/2020, hepatic biliary scintigraphy could be considered for evaluation of cystic duct patency. Additional incidental findings as noted above. Aortic Atherosclerosis (ICD10-I70.0). Electronically Signed   By: Fidela Salisbury MD   On: 09/28/2020 02:44   NM Hepatobiliary Liver Func  Result Date: 09/28/2020 CLINICAL DATA:  Abdominal pain. EXAM: NUCLEAR MEDICINE HEPATOBILIARY IMAGING TECHNIQUE: Sequential images of the abdomen were obtained out to 60 minutes following intravenous administration of radiopharmaceutical. RADIOPHARMACEUTICALS:  5.3 mCi Tc-66m Choletec IV COMPARISON:   CT 09/27/2020 FINDINGS: Prompt clearance of radiotracer from blood pool and homogeneous uptake in liver. Counts are evident in the small bowel by 20 minutes. At 75 minutes, the gallbladder had not filled. 3 mg of IV morphine were administered to augment filling of the gallbladder. Gallbladder failed to fill following administration of IV morphine. IMPRESSION: Non filling of the gallbladder implies obstruction of the cystic duct (acute cholecystitis). False positives can occur with prolonged fasting state or opioid administration. These results will be called to the ordering clinician or representative by the Radiologist Assistant, and communication documented in the PACS or [employer] Electronically Signed   By: SSuzy BouchardM.D.   On: 09/28/2020 20:08    Anti-infectives: Anti-infectives (From admission, onward)   Start     Dose/Rate Route Frequency Ordered Stop   09/20/20 1200  meropenem (MERREM) 1 g in sodium chloride 0.9 % 100 mL IVPB        1 g 200 mL/hr over 30 Minutes Intravenous Every 12 hours 09/20/20 1101 09/29/20 2359   09/18/20 1115  cefTRIAXone (ROCEPHIN) 2 g in sodium chloride 0.9 % 100 mL IVPB        2 g 200 mL/hr over 30 Minutes Intravenous  Once 09/18/20 1103 09/18/20 1238   09/18/20 1115  metroNIDAZOLE (FLAGYL) IVPB 500 mg        500 mg 100 mL/hr over 60 Minutes Intravenous  Once 09/18/20 1103 09/18/20 1309       Assessment/Plan CKD stage III - cr 1.74 IDDM Osteoporosis Bedbound Chronically dislocated right hip Cirrhosis Hep B Recent 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 Ninfa Linden Persistent nausea and vomiting  ?cholecystitis  - surgical path consistent with acute appendicitis. However, she was noted to have a thin white fluid noted on the right side of her abdomen during surgery of unclear etiology.  - pt has had  increased n/v post op and been complaining of more RUQ pain now - WBC normal and AST/ALT not elevated, alk phos 257 - RUQ [place] and CT show some distention of gallbladder, minimal gallbladder wall thickening and cholelithiasis - this is all non-specific in a patient with cirrhosis - HIDA showed non-filling of the gallbladder suggestive of acute cholecystitis - I do not believe another surgery at this time is in patient's best interest, have placed consult for possible cholecystostomy tube   ID - rocephin/flagylpreop; merrem 11/10>> FEN -NPO  VTE - SCDs,Lovenox Foley - none  LOS: 11 days    Norm Parcel , [facility] Surgery 09/29/2020, 10:19 AM Please see Amion for pager number during day hours 7:00am-4:30pm

## 2020-09-29 NOTE — Progress Notes (Signed)
SLP Cancellation Note  Patient Details Name: Whitney [name] MRN: [id number] DOB: 05-14-1935   Cancelled treatment:       Reason Eval/Treat Not Completed: Medical issues which prohibited therapy; pt remains NPO d/t persistent cholecystitis per nursing. ST will continue efforts.   Elvina Sidle, M.S., [place] 09/29/2020, 11:29 AM

## 2020-09-29 NOTE — Progress Notes (Signed)
Request to IR for possible percutaneous cholecystostomy placement due to recent HIDA scan showing non-filling of the gallbladder suggestive of acute cholecystitis.  Patient history and imaging have been reviewed by Dr. Anselm Pancoast today who recommends continuing medical management at this time due to the presence of ascites which would cause continuous leakage from the drain insertion site as well as high risk for peritonitis. Additionally, due the presence of stones the drain would likely need to remain long term which put her at further risk for additional infection.  Plan discussed with Dr. Rosendo Gros by Dr. Anselm Pancoast - no procedure planned in IR at this time. Please re-consult IR in the future if needed.  Candiss Norse, PA-C

## 2020-09-29 NOTE — Progress Notes (Signed)
Subjective:   Overnight, no acute events.  This morning, patient continues to endorse nonspecific abdominal pain and bilateral lower extremity pain. She has no further complaints.  Objective:  Vital signs in last 24 hours: Vitals:   09/29/20 0422 09/29/20 1003 09/29/20 1035 09/29/20 1054  BP: 112/70 (!) 108/56 110/60 102/69  Pulse: 90 90 94 90  Resp: 16 18 18 16   Temp: 98.1 F (36.7 C) 97.7 F (36.5 C) 97.8 F (36.6 C) 98.4 F (36.9 C)  TempSrc: Oral Axillary Axillary Axillary  SpO2: 100% 97% 99% 100%  Weight:      Height:      SpO2: 100 % O2 Flow Rate (L/min): 1 L/min  Filed Weights   09/18/20 1158 09/21/20 0500  Weight: 90 kg 82.1 kg    Intake/Output Summary (Last 24 hours) at 09/29/2020 1142 Last data filed at 09/29/2020 0609 Gross per 24 hour  Intake 104.08 ml  Output 700 ml  Net -595.92 ml   Physical Exam Vitals and nursing note reviewed. Exam conducted with a chaperone present.  Constitutional:      General: She is not in acute distress.    Appearance: She is ill-appearing.  HENT:     Head: Normocephalic and atraumatic.  Cardiovascular:     Rate and Rhythm: Normal rate and regular rhythm.     Heart sounds: Normal heart sounds.  Pulmonary:     Effort: Pulmonary effort is normal. No respiratory distress.     Breath sounds: No rales.  Abdominal:     General: Abdomen is flat. Bowel sounds are normal. There is distension.     Palpations: Abdomen is soft.     Tenderness: There is abdominal tenderness in the suprapubic area.     Comments: Large, ventral hernia  Musculoskeletal:     Right lower leg: 2+ Pitting Edema present.     Left lower leg: 2+ Pitting Edema present.  Skin:    General: Skin is warm and dry.     Capillary Refill: Capillary refill takes less than 2 seconds.  Neurological:     General: No focal deficit present.     Mental Status: She is alert and oriented to person, place, and time.  Psychiatric:        Mood and Affect: Mood normal.         Behavior: Behavior normal.    CBC Latest Ref Rng & Units 09/29/2020 09/27/2020 09/26/2020  WBC 4.0 - 10.5 K/uL 5.9 5.9 5.9  Hemoglobin 12.0 - 15.0 g/dL 7.0(L) 8.3(L) 8.2(L)  Hematocrit 36 - 46 % 22.0(L) 27.1(L) 27.0(L)  Platelets 150 - 400 K/uL 148(L) 118(L) 107(L)   CMP Latest Ref Rng & Units 09/29/2020 09/28/2020 09/27/2020  Glucose 70 - 99 mg/dL 124(H) 175(H) 174(H)  BUN 8 - 23 mg/dL 31(H) 32(H) 29(H)  Creatinine 0.44 - 1.00 mg/dL 1.74(H) 1.81(H) 1.72(H)  Sodium 135 - 145 mmol/L 148(H) 145 143  Potassium 3.5 - 5.1 mmol/L 4.2 4.4 4.4  Chloride 98 - 111 mmol/L 117(H) 115(H) 112(H)  CO2 22 - 32 mmol/L 26 21(L) 24  Calcium 8.9 - 10.3 mg/dL 7.8(L) 7.8(L) 7.9(L)  Total Protein 6.5 - 8.1 g/dL 5.2(L) 5.6(L) -  Total Bilirubin 0.3 - 1.2 mg/dL 1.0 0.9 -  Alkaline Phos 38 - 126 U/L 257(H) 245(H) -  AST 15 - 41 U/L 27 23 -  ALT 0 - 44 U/L 10 10 -   IMAGING: NM Hepatobiliary Liver Func  Result Date: 09/28/2020 CLINICAL DATA:  Abdominal pain. EXAM:  NUCLEAR MEDICINE HEPATOBILIARY IMAGING TECHNIQUE: Sequential images of the abdomen were obtained out to 60 minutes following intravenous administration of radiopharmaceutical. RADIOPHARMACEUTICALS:  5.3 mCi Tc-33m  Choletec IV COMPARISON:  CT 09/27/2020 FINDINGS: Prompt clearance of radiotracer from blood pool and homogeneous uptake in liver. Counts are evident in the small bowel by 20 minutes. At 75 minutes, the gallbladder had not filled. 3 mg of IV morphine were administered to augment filling of the gallbladder. Gallbladder failed to fill following administration of IV morphine. IMPRESSION: Non filling of the gallbladder implies obstruction of the cystic duct (acute cholecystitis). False positives can occur with prolonged fasting state or opioid administration. These results will be called to the ordering clinician or representative by the Radiologist Assistant, and communication documented in the PACS or [employer]. Electronically  Signed   By: Suzy Bouchard M.D.   On: 09/28/2020 20:08   Assessment/Plan:  Principal Problem:   Acute appendicitis Active Problems:   Protein-calorie malnutrition, severe ([place] II)   DNR (do not resuscitate) discussion   S/P appendectomy   Urinary tract infection due to ESBL Klebsiella   Adult failure to thrive  [name] is a 85 year old female with past medical history of IDDM, CKD3, hypoalbuminemia, chronic right hip fracture, recent admission for emphysematous cystitis and left-sided emphysematous pyelitis with ESBL klebsiella bacteremia who presented to [facility] on 11/08 with right lower quadrant abdominal pain found to have acute appendicitis s/p appendectomy on 11/08 with hospital course complicated by continued ESBL producing klebsiella pneumoniae urinary tract infection despite previously completed treatment course with meropenem as well as acute cholecystitis.  #Acute cholecystitis, active Patient with progressively persistent abdominal pain, nausea and vomiting the past few days. RUQ ultrasound, CT and HIDA consistent with acute cholecystitis. General surgery consulted and initially recommending percutaneous cholecystostomy placement, however IR declined this intervention and recommending continued medical management given presence of ascites, high risk for peritonitis, and need for long term placement which would increase risk for infection.  -General surgery following, appreciate recommendations:  -Medical management with continuation of antibiotics   -Meropenem per pharmacy consult  -Advance diet from NPO to carb modified, nectar thick  -IV zofran PRN -Daily CMP  #ESBL producing Klebsiella Pneumoniae UTI, active Per ID recommendations, patient has completed additional ten day course following previous two week course of meropenem. Patient without fever or leukocytosis. Urology following and recommending removal of stent following hospitalization. -Appreciate ID  recommendations  -Final day of 10-day course of meropenem for UTI, however meropenem to be continued for acute cholecystitis -Appreciate urology recommendations  -Removal left ureteral stent on 10/12/2020  -Foley to gravity -Daily CBC -Zofran PRN  #AoCKD 3a, active, stable #Anemia of renal disease, chronic Patient's creatinine elevated to 1.74 up from baseline of 1.3. Patient's AKI likely multifactorial in the setting of intravascular volume depletion, poor oral intake, and prior urinary tract infection. Unfortunately, patient's significantly low albumin limits the ability to provide IVF without causing additional extravasation of fluids. Patient's hemoglobin 7.0 today, therefore we will transfuse 1 unit of blood today. -Monitor creatinine on daily BMP -Transfuse 1 unit of blood today  #Goals of care Patient has clearly stated to our team that she does not want aggressive interventions and would like to pass peacefully, however she defers to her daughter for her medical decision making. Following extensive conversations with daughter by the palliative care team, IMTS, general surgery team, and chaplain, patient's daughter still desires patient to be Full Code and for patient to undergo aggressive medical and surgical  interventions. -Palliative Care team following, appreciate recommendations:  -Transitions of care outpatient palliative support  -Transitions of care financial support  -Chaplain following  -[facility] palliative services upon discharge  #Anasarca, chronic #Hypoalbuminemia, chronic #Cirrhosis, chronic Patient remains significantly volume overloaded on examination in the setting of her profound hypoalbuminemia. Patient has underlying cirrhosis of undetermined etiology, however hepatitis panel reveals evidence of prior hepatitis B infection. -Consider outpatient GI follow-up upon discharge -Boost supplementation TID  #IDDM, chronic Patient initially on sensitive SSI with escalation  to moderate on 11/15. Glucose range 110-151. -Continue moderate SSI -CBGs  #Asymptomatic external hemorrhoids, chronic -Continue topical hydrocortisone cream twice daily  #VTE ppx: Lovenox 30mg  daily #Diet: Advance from NPO to Carb modified, nectar thick #Bowel regimen: Scheduled daily senna and miralax #Code status: Full code (appreciate palliative care consultation and support) #PT/OT recs: No PT/OT follow up; supervision/assistance - 24 hour  Cato Mulligan, MD 09/29/2020, 11:42 AM Pager: [phone] After 5pm on weekdays and 1pm on weekends: On Call pager [phone]

## 2020-09-30 DIAGNOSIS — N179 Acute kidney failure, unspecified: Secondary | ICD-10-CM

## 2020-09-30 LAB — COMPREHENSIVE METABOLIC PANEL
ALT: 9 U/L (ref 0–44)
AST: 34 U/L (ref 15–41)
Albumin: 1.5 g/dL — ABNORMAL LOW (ref 3.5–5.0)
Alkaline Phosphatase: 200 U/L — ABNORMAL HIGH (ref 38–126)
Anion gap: 7 (ref 5–15)
BUN: 35 mg/dL — ABNORMAL HIGH (ref 8–23)
CO2: 26 mmol/L (ref 22–32)
Calcium: 7.6 mg/dL — ABNORMAL LOW (ref 8.9–10.3)
Chloride: 115 mmol/L — ABNORMAL HIGH (ref 98–111)
Creatinine, Ser: 1.83 mg/dL — ABNORMAL HIGH (ref 0.44–1.00)
GFR, Estimated: 27 mL/min — ABNORMAL LOW (ref >60)
Glucose, Bld: 108 mg/dL — ABNORMAL HIGH (ref 70–99)
Potassium: 4.7 mmol/L (ref 3.5–5.1)
Sodium: 148 mmol/L — ABNORMAL HIGH (ref 135–145)
Total Bilirubin: 0.9 mg/dL (ref 0.3–1.2)
Total Protein: 5 g/dL — ABNORMAL LOW (ref 6.5–8.1)

## 2020-09-30 LAB — TYPE AND SCREEN
ABO/RH(D): A POS
Antibody Screen: NEGATIVE
Unit division: 0

## 2020-09-30 LAB — CBC
HCT: 26.1 % — ABNORMAL LOW (ref 36.0–46.0)
Hemoglobin: 8.1 g/dL — ABNORMAL LOW (ref 12.0–15.0)
MCH: 28.5 pg (ref 26.0–34.0)
MCHC: 31 g/dL (ref 30.0–36.0)
MCV: 91.9 fL (ref 80.0–100.0)
Platelets: 125 10*3/uL — ABNORMAL LOW (ref 150–400)
RBC: 2.84 MIL/uL — ABNORMAL LOW (ref 3.87–5.11)
RDW: 16.6 % — ABNORMAL HIGH (ref 11.5–15.5)
WBC: 12.2 10*3/uL — ABNORMAL HIGH (ref 4.0–10.5)
nRBC: 0.2 % (ref 0.0–0.2)

## 2020-09-30 LAB — BPAM RBC
Blood Product Expiration Date: 202112032359
ISSUE DATE / TIME: 202111191026
Unit Type and Rh: 6200

## 2020-09-30 LAB — GLUCOSE, CAPILLARY
Glucose-Capillary: 91 mg/dL (ref 70–99)
Glucose-Capillary: 96 mg/dL (ref 70–99)
Glucose-Capillary: 114 mg/dL — ABNORMAL HIGH (ref 70–99)
Glucose-Capillary: 120 mg/dL — ABNORMAL HIGH (ref 70–99)

## 2020-09-30 NOTE — Progress Notes (Signed)
Subjective:   Overnight, no acute events.  Resting comfortably in bed this morning on rounds.  Objective:  Vital signs in last 24 hours: Vitals:   09/29/20 1245 09/29/20 1427 09/29/20 2001 09/30/20 0419  BP: (!) 143/83 (!) 105/59 106/60 (!) 118/51  Pulse: 88 (!) 110 100 88  Resp: 16 18 17 17   Temp: 98.2 F (36.8 C) 98.6 F (37 C) 98.4 F (36.9 C) 98.6 F (37 C)  TempSrc: Axillary Oral Oral Oral  SpO2: 100% 99% 99% 97%  Weight:      Height:      SpO2: 97 % O2 Flow Rate (L/min): 1 L/min  Filed Weights   09/18/20 1158 09/21/20 0500  Weight: 90 kg 82.1 kg    Intake/Output Summary (Last 24 hours) at 09/30/2020 0539 Last data filed at 09/29/2020 1432 Gross per 24 hour  Intake 18.5 ml  Output 275 ml  Net -256.5 ml   Physical Exam General: chronically ill appearing Abd: bowel sounds appreciated throughout. abd remains distended. No pain on palpation.  CBC Latest Ref Rng & Units 09/30/2020 09/29/2020 09/29/2020  WBC 4.0 - 10.5 K/uL 12.2(H) - 5.9  Hemoglobin 12.0 - 15.0 g/dL 8.1(L) 8.1(L) 7.0(L)  Hematocrit 36 - 46 % 26.1(L) 25.6(L) 22.0(L)  Platelets 150 - 400 K/uL 125(L) - 148(L)   CMP Latest Ref Rng & Units 09/30/2020 09/29/2020 09/28/2020  Glucose 70 - 99 mg/dL 108(H) 124(H) 175(H)  BUN 8 - 23 mg/dL 35(H) 31(H) 32(H)  Creatinine 0.44 - 1.00 mg/dL 1.83(H) 1.74(H) 1.81(H)  Sodium 135 - 145 mmol/L 148(H) 148(H) 145  Potassium 3.5 - 5.1 mmol/L 4.7 4.2 4.4  Chloride 98 - 111 mmol/L 115(H) 117(H) 115(H)  CO2 22 - 32 mmol/L 26 26 21(L)  Calcium 8.9 - 10.3 mg/dL 7.6(L) 7.8(L) 7.8(L)  Total Protein 6.5 - 8.1 g/dL 5.0(L) 5.2(L) 5.6(L)  Total Bilirubin 0.3 - 1.2 mg/dL 0.9 1.0 0.9  Alkaline Phos 38 - 126 U/L 200(H) 257(H) 245(H)  AST 15 - 41 U/L 34 27 23  ALT 0 - 44 U/L 9 10 10    Assessment/Plan:  Principal Problem:   Acute appendicitis Active Problems:   Protein-calorie malnutrition, severe (HCC)   DNR (do not resuscitate) discussion   S/P appendectomy    Urinary tract infection due to ESBL Klebsiella   Adult failure to thrive  [name] is a 85 year old female with past medical history of IDDM, CKD3, hypoalbuminemia, chronic right hip fracture, recent admission for emphysematous cystitis and left-sided emphysematous pyelitis with ESBL klebsiella bacteremia who presented to [facility] on 11/08 with right lower quadrant abdominal pain found to have acute appendicitis s/p appendectomy on 11/08 with hospital course complicated by continued ESBL producing klebsiella pneumoniae urinary tract infection despite previously completed treatment course with meropenem as well as acute cholecystitis.  # Acute cholecystitis, active. General surgery and IR are deferring surgical management at this time due to her significant comorbidities. Unfortunately, antibiotic therapy is not the definitive treatment of cholecystitis as this is not serving as source control and symptoms are highly likely to reappear once antibiotics are stopped and another stone passes.  White count is up 5.9>12.2 since yesterday. LFTs stable. -continue merrem at this time. If symptoms worsen or she begins to show more systemic signs of infection, will consider consulting ID to discuss antibiotic choice.  # AKI on CKD 3a, active, stable. Renal function stable from yesterday.  # Chronic Anasarca 2/2 chronic hypoalbuminemia 2/2 cirrhosis, poor oral    intake, chronic disease #  Hypernatremia. Suspect this to be hypovolemic in nature. UOP poor since yesterday. IVF alone are a futile effort as very little will remain intravascular in the setting of her hypoalbuminemia and she already has severe anasarca. Albumin has been being used in conjunction with IVF however, this is, once again, a futile effort as the underlying issue of low albumin from her chronic diseased state is untreatable.    # Anemia of CKD. S/p 1U PRBC 11/19. Hgb stable from post transfusion hgb. Continue daily CBCs  # Goals of  care Patient has clearly stated to our team that she does not want aggressive interventions and would like to pass peacefully, however she defers to her daughter for her medical decision making and wants her to remain full code with full scope of care.  This patient's situation is complicated by her daughter's unreasonable goals of care despite multiple conversations. I agree with surgery/IR that she is a poor candidate for surgical intervention. Unfortunately her prognosis is poor, even without the cholecystitis, and will remain that way despite the temporizing medical interventions that we are providing. I would consider getting an ethics consult next week if the situation remains unchanged.  #IDDM, chronic. Oral intake remains poor and blood sugars are remaining in the low 100s. Mealtime and HS SSI discontinued to avoid hypoglycemia this hospitalization. Discontinue 4x daily CBGs. Recommend checking CBG daily. Glucose goal 120-180.   #Asymptomatic external hemorrhoids, chronic -Continue topical hydrocortisone cream twice daily  # ESBL producing Klebsiella Pneumoniae UTI (resolved) Completed 10d course of merrem. #Acute appendicitis (resolved) s/p lap appendectomy.  #VTE ppx: Lovenox 30mg  daily #Diet:  Carb modified, nectar thick #Bowel regimen: Scheduled daily senna and miralax #Code status: Full code (appreciate palliative care consultation and support) #PT/OT recs: No PT/OT follow up; supervision/assistance - 24 hour  Mitzi Hansen, MD Internal Medicine Resident PGY-2 [facility] Pager: [phone] 09/30/2020 5:46 AM    After 5pm on weekdays and 1pm on weekends: On Call pager [phone]

## 2020-09-30 NOTE — Progress Notes (Signed)
Pt refusing multiple medications today, including Nystatin, Lovenox, and Miralax. Also refusing to allow discontinuation of cardiac monitor. Daughter at bedside for some attempts at med administration. Encouraged pt to take meds, but she was also unsuccessful.

## 2020-10-01 DIAGNOSIS — Z7189 Other specified counseling: Secondary | ICD-10-CM | POA: Diagnosis not present

## 2020-10-01 DIAGNOSIS — E43 Unspecified severe protein-calorie malnutrition: Secondary | ICD-10-CM | POA: Diagnosis not present

## 2020-10-01 DIAGNOSIS — R627 Adult failure to thrive: Secondary | ICD-10-CM | POA: Diagnosis not present

## 2020-10-01 DIAGNOSIS — N390 Urinary tract infection, site not specified: Secondary | ICD-10-CM | POA: Diagnosis not present

## 2020-10-01 LAB — CBC
HCT: 26.4 % — ABNORMAL LOW (ref 36.0–46.0)
Hemoglobin: 8.2 g/dL — ABNORMAL LOW (ref 12.0–15.0)
MCH: 28.5 pg (ref 26.0–34.0)
MCHC: 31.1 g/dL (ref 30.0–36.0)
MCV: 91.7 fL (ref 80.0–100.0)
Platelets: 126 10*3/uL — ABNORMAL LOW (ref 150–400)
RBC: 2.88 MIL/uL — ABNORMAL LOW (ref 3.87–5.11)
RDW: 16 % — ABNORMAL HIGH (ref 11.5–15.5)
WBC: 8.9 10*3/uL (ref 4.0–10.5)
nRBC: 0 % (ref 0.0–0.2)

## 2020-10-01 LAB — COMPREHENSIVE METABOLIC PANEL
ALT: 10 U/L (ref 0–44)
AST: 31 U/L (ref 15–41)
Albumin: 1.4 g/dL — ABNORMAL LOW (ref 3.5–5.0)
Alkaline Phosphatase: 196 U/L — ABNORMAL HIGH (ref 38–126)
Anion gap: 5 (ref 5–15)
BUN: 35 mg/dL — ABNORMAL HIGH (ref 8–23)
CO2: 25 mmol/L (ref 22–32)
Calcium: 7.6 mg/dL — ABNORMAL LOW (ref 8.9–10.3)
Chloride: 117 mmol/L — ABNORMAL HIGH (ref 98–111)
Creatinine, Ser: 1.61 mg/dL — ABNORMAL HIGH (ref 0.44–1.00)
GFR, Estimated: 32 mL/min — ABNORMAL LOW (ref >60)
Glucose, Bld: 125 mg/dL — ABNORMAL HIGH (ref 70–99)
Potassium: 4.7 mmol/L (ref 3.5–5.1)
Sodium: 147 mmol/L — ABNORMAL HIGH (ref 135–145)
Total Bilirubin: 1.2 mg/dL (ref 0.3–1.2)
Total Protein: 5 g/dL — ABNORMAL LOW (ref 6.5–8.1)

## 2020-10-01 LAB — GLUCOSE, CAPILLARY
Glucose-Capillary: 109 mg/dL — ABNORMAL HIGH (ref 70–99)
Glucose-Capillary: 138 mg/dL — ABNORMAL HIGH (ref 70–99)
Glucose-Capillary: 122 mg/dL — ABNORMAL HIGH (ref 70–99)

## 2020-10-01 NOTE — Progress Notes (Signed)
Patient ID: Whitney [name], female   DOB: [birth year], 85 y.o.   MRN: [id number]  85 year old female with past medical history of IDDM, CKD3, hypoalbuminemia, chronic right hip fracture, recent admission for emphysematous cystitis and left-sided emphysematous pyelitis with ESBL klebsiella bacteremia who presented to [facility] on 11/08 with right lower quadrant abdominal pain found to have acute appendicitis s/p appendectomy on 11/08.  1. Seen on CT A/P 08/17/2020.S/p cystoscopy, left ureteral stent placement on 08/18/2020.CT A/P 08/20/2020 with decompressed left renal collecting system with no gas in the collecting system and bladder decompressed around a Foley catheter with no further gas within the bladder.Received prolonged course of antibiotics and ultimately discharged home. Represented with ESWL Klebsiella.CT A/P 09/20/2020 revealed left ureteral stent placement in appropriate position with no hydronephrosis and collapsed bladder around a Foley catheter.   This nurse practitioner reviewed medical records and received report from team and then visited  patient at bedside. Patient remains weak, with poor po intake, albumin is 1.4. Creatine 1.53.  Overall failure to thrive.   Attending team continues to assess and treat the treatable at daughter requests.  She is not a surgical candidate.    Spoke in detail with attending team, Dr Wynetta Emery and Dr Darrick Meigs.  They verbalized concern over poor prognosis, medical interventions  are temporizing medical interventions.    She is high risk for decompensation.  I spoke to daughter by telephone.   Education offered on concepts of adult failure to thrive and the patient's high risk for decompensation. Daughter sees some improvement over the past few days.  Once patient is stable for discharge daughter wishes for her to return home with her, with home heath services.  Not interested in a hospice/comfort approach at this time.  Raised awareness to the importance  of continued conversation with all family members and the  medical providers regarding overall plan of care and treatment options,  ensuring decisions are within the context of the patients values and GOCs.  Patient has verbalized via interpreter and medical providers her feeling that she is comfortable/ready with the idea of her death and not desiring aggressive medical interventions.   However she is also comfortable with her daughter making all medical decisions and had relinquished those decisions to her daughter.  I strongly recommend a follow-up/ongoing meetings to include the attending team, the patient and family and an in person [place] interpreter in order to enhance communication and decision making process. Daughter is open to and in agreement.  I will attempt to set up interpreter for sometime on Tuesday and communicate time of meeting with necessary parties.   Questions and concerns addressed   PMT will continue to support holistically.  Total time spent on the unit was 35 minutes  Greater than 50% of the time was spent in counseling and coordination of care  Wadie Lessen NP  Palliative Medicine Team Team Phone # [phone] Pager [phone]

## 2020-10-01 NOTE — Progress Notes (Signed)
  Speech Language Pathology Treatment: Dysphagia  Patient Details Name: Whitney [name] MRN: [id number] DOB: 12-Jul-1935 Today's Date: 10/01/2020 Time: [id number] SLP Time Calculation (min) (ACUTE ONLY): 25 min  Assessment / Plan / Recommendation Clinical Impression  ST follow up now that the patient is no longer nauseated and vomiting.  Per RN patient is now on thin liquids as she was declining to drink the thickened liquids.  Per the patient's daughter that was present the patient reported she would rather not drink anything if her liquids were thickened.  Her son who is a physician per the patient's daughter was also on the phone listening.  Educated the patient and family on reason for the thickened liquids.  The patient still does not care for the thickened liquids.  Educated on the importance of good oral care using a toothbrush and toothpaste to mitigate risk of aspiration.  The patient's daughter expressed understanding and stated she would complete oral care 2-3 times per day.  Meal observation was completed using thin liquids via single, small straw sips, pureed material and dry solids.  Patient was in a reclined position in her bed. Per her daughter she was sat as upright as she normally is at home for intake.  Patient took very small sips and small amounts of food.  Per the daughter the patient has been consuming food that is being brought in from home relatively well and that her limited intake during this session was poor because the patient was full from lunch.  Overt s/s of aspiration were not seen.  However, per MBS completed on 09/21/20 patient was observed to SILENTLY aspirate thin liquids. Given the patient's dislike of thickened liquids and decreased fluid intake which increases risk for dehydration suggest advancing liquids to thin with aggressive oral care 2-3 times per day using a toothbrush and toothpaste.  Patient should be as upright as possible.  Risks of aspiration were explained.   ST will follow during acute stay for continued education.     HPI HPI: WhitneyDixon is a 85 yo F w/ PMH of IDDm, CKD3, Hypoalbuminemia, Chronic R hip fracture and recent admission for pyelonephritis with ESBL kleb bacteremia presenting to [facility] with abdominal pain due to appendicitis. Seen by SLP in 10/21: recommended dys 1 diet with thin liquids 2/2 instances of coughing and multiple swallows. SLP s/o as pt appeared to be at baseline functioning. CT abdomen: Lungs: Bibasilar atelectasis and effusions.       SLP Plan  Continue with current plan of care       Recommendations  Diet recommendations: Regular;Thin liquid Liquids provided via: Cup;Straw Medication Administration: Whole meds with puree Supervision: Intermittent supervision to cue for compensatory strategies Compensations: Slow rate;Small sips/bites Postural Changes and/or Swallow Maneuvers: Upright 30-60 min after meal (seated as upright as possible)                Oral Care Recommendations: Oral care BID Follow up Recommendations: Home health SLP SLP Visit Diagnosis: Dysphagia, pharyngoesophageal phase (R13.14) Plan: Continue with current plan of care       Whitney [name], Whitney [name], Whitney [name] Acute Rehab SLP [phone]  Whitney [name] 10/01/2020, 3:00 PM

## 2020-10-01 NOTE — Progress Notes (Signed)
Spoke and assessed patient's status utilizing mobile interpreter.  Unable to utilize Arabic / [place] dialect, however patient does understand [place] dialects and prefers a female Astronomer.  Was very receptive to utilizing this tool to assist her in understanding her care for the day and what her status is.  She indicates to this nurse that she is experiencing mild discomfort, but nothing that she requires medication for.  She relays that she does not want a female nurse for the duration of her hospitalization due to her cultural beliefs.  Conversed well with staff during this session.  Proved to be oriented and states she is generally satisfied with care.

## 2020-10-01 NOTE — Progress Notes (Signed)
[facility] ([facility] Community Based Palliative Care       This patient has been referred to our palliative care services in the community.  [facility] will continue to follow for any discharge planning needs and to coordinate admission onto palliative care.   If you have questions or need assistance, please call [phone] or contact the hospital Liaison listed on AMION.     Thank you for the opportunity to participate in this patient's care.     Chrislyn King, BSN, RN [facility] Hospital Liaison   [phone] (24h on call) 

## 2020-10-01 NOTE — Progress Notes (Signed)
Subjective:   Overnight, patient refused multiple medications (acetaminophen, nystatin, enoxaparin, and Miralax) and also refusing discontinuation of cardiac monitor.  This morning, patient reports that she is thirsty and hungry. She denies significant abdominal pain at this time, however she reports some mild right-sided abdominal discomfort. She has no further complaints.  Objective:  Vital signs in last 24 hours: Vitals:   09/30/20 0419 09/30/20 1438 09/30/20 2004 10/01/20 0605  BP: (!) 118/51 94/67 95/77  (!) 122/51  Pulse: 88 84 83 77  Resp: 17 18 17 18   Temp: 98.6 F (37 C) 98.1 F (36.7 C) (!) 97.3 F (36.3 C) 97.7 F (36.5 C)  TempSrc: Oral Axillary Axillary Tympanic  SpO2: 97% 98% 99% 100%  Weight:      Height:      SpO2: 100 % O2 Flow Rate (L/min): 1 L/min  Filed Weights   09/18/20 1158 09/21/20 0500  Weight: 90 kg 82.1 kg    Intake/Output Summary (Last 24 hours) at 10/01/2020 0706 Last data filed at 10/01/2020 0618 Gross per 24 hour  Intake 140 ml  Output 250 ml  Net -110 ml   Physical Exam Vitals and nursing note reviewed. Exam conducted with a chaperone present.  Constitutional:      General: She is not in acute distress.    Appearance: She is ill-appearing.  HENT:     Head: Normocephalic and atraumatic.  Cardiovascular:     Rate and Rhythm: Normal rate and regular rhythm.     Heart sounds: Normal heart sounds.  Pulmonary:     Effort: Pulmonary effort is normal. No respiratory distress.     Breath sounds: No rales.  Abdominal:     General: Abdomen is flat. Bowel sounds are normal. There is distension.     Palpations: Abdomen is soft.     Tenderness: There is abdominal tenderness in the right upper quadrant.     Comments: Mild tenderness of right-middle and right-upper abdomen  Musculoskeletal:     Right lower leg: 2+ Pitting Edema present.     Left lower leg: 2+ Pitting Edema present.  Skin:    General: Skin is warm and dry.     Capillary  Refill: Capillary refill takes less than 2 seconds.  Neurological:     General: No focal deficit present.     Mental Status: She is alert and oriented to person, place, and time.  Psychiatric:        Mood and Affect: Mood normal.        Behavior: Behavior normal.    CBC Latest Ref Rng & Units 10/01/2020 09/30/2020 09/29/2020  WBC 4.0 - 10.5 K/uL 8.9 12.2(H) -  Hemoglobin 12.0 - 15.0 g/dL 8.2(L) 8.1(L) 8.1(L)  Hematocrit 36 - 46 % 26.4(L) 26.1(L) 25.6(L)  Platelets 150 - 400 K/uL 126(L) 125(L) -   CMP Latest Ref Rng & Units 10/01/2020 09/30/2020 09/29/2020  Glucose 70 - 99 mg/dL 125(H) 108(H) 124(H)  BUN 8 - 23 mg/dL 35(H) 35(H) 31(H)  Creatinine 0.44 - 1.00 mg/dL 1.61(H) 1.83(H) 1.74(H)  Sodium 135 - 145 mmol/L 147(H) 148(H) 148(H)  Potassium 3.5 - 5.1 mmol/L 4.7 4.7 4.2  Chloride 98 - 111 mmol/L 117(H) 115(H) 117(H)  CO2 22 - 32 mmol/L 25 26 26   Calcium 8.9 - 10.3 mg/dL 7.6(L) 7.6(L) 7.8(L)  Total Protein 6.5 - 8.1 g/dL 5.0(L) 5.0(L) 5.2(L)  Total Bilirubin 0.3 - 1.2 mg/dL 1.2 0.9 1.0  Alkaline Phos 38 - 126 U/L 196(H) 200(H) 257(H)  AST 15 -  41 U/L 31 34 27  ALT 0 - 44 U/L 10 9 10    Glucose (24 hours) - 109, 120, 114, 96, 91  IMAGING: No results found.   Assessment/Plan:  Principal Problem:   Acute appendicitis Active Problems:   Protein-calorie malnutrition, severe ([place])   DNR (do not resuscitate) discussion   S/P appendectomy   Urinary tract infection due to ESBL Klebsiella   Adult failure to thrive  Whitney [name] is a 85 year old female with past medical history of T2DM, CKD3, severe nutritional anasarca, severe physical impairment, chronic right hip fracture, recent admission for emphysematous cystitis and left-sided emphysematous pyelitis with ESBL klebsiella bacteremia who presented to [facility] on 11/08 with right lower quadrant abdominal pain found to have acute appendicitis s/p appendectomy on 11/08 with hospital course complicated by continued ESBL producing  klebsiella pneumoniae urinary tract infection despite previously completed treatment course with meropenem as well as acute cholecystitis.  #Acute cholecystitis, active Patient's abdominal pain, nausea and vomiting better controlled today. Alkaline phosphatase gradually trending down and leukocytosis resolved. Agree with general surgery and IR recommendations for medical management of her condition at this time due to overall presentation. -Continue meropenem -If symptoms worsen, consider consulting ID for antibiotic selection  -IV zofran PRN -Daily CMP  #AoCKD 3a, active, stable Patient's creatinine downtrending, most recently 1.61 (baseline 1.3). Patient's AKI likely multifactorial in the setting of intravascular volume depletion, poor oral intake, and prior urinary tract infection. Unfortunately, patient's significantly low albumin limits the ability to provide IVF without causing additional extravasation of fluids. -Monitor creatinine on daily CMP  #Severe nutritional anasarca, chronic #Hypoalbuminemia, chronic #Cirrhosis, chronic Patient remains significantly volume overloaded on examination in the setting of her profound hypoalbuminemia. Patient has underlying cirrhosis of undetermined etiology, however hepatitis panel consistent with prior hepatitis B infection. -Consider outpatient GI follow-up upon discharge -Boost supplementation TID -Assist with feeding patient  #Anemia of CKD, chronic Hemoglobin stable 8.2 from 8.1. -Daily CBC -Transfuse for hemoglobin <7  #Goals of care discussions, ongoing Patient has clearly stated to our team that she does not want aggressive interventions and would like to pass peacefully, however she defers to her daughter for her medical decision making and wants her to remain full code with full scope of care. This patient's situation is complicated by her daughter's unreasonable goals of care despite multiple conversations. Agree with surgery/IR that  she is a poor candidate for surgical intervention. Unfortunately her prognosis is poor, even without the cholecystitis, and will remain that way despite the temporizing medical interventions that we are providing. -Consider ethics consultation if continues to be unimproved  #T2DM, chronic Oral intake continues to be poor with blood sugars remaining in the 100s without insulin administration. To prevent hypoglycemia, patient's insulin dosing and CBGs will be spaced out to daily rather than four times daily. -Daily CBG -Glucose goal 120-180 -Insulin PRN  #Asymptomatic external hemorrhoids, chronic -Continue topical hydrocortisone cream twice daily  #ESBL producing Klebsiella Pneumoniae UTI, resolved #Acute appendicitis s/p appendectomy, resolved  #VTE ppx: Lovenox 30mg  daily #Diet: Carb modified, thin fluids #Bowel regimen: Scheduled daily senna and miralax #Code status: Full code (appreciate palliative care consultation and support) #PT/OT recs: No PT/OT follow up; supervision/assistance - 24 hour  Cato Mulligan, MD 10/01/2020, 7:06 AM Pager: [phone] After 5pm on weekdays and 1pm on weekends: On Call pager [phone]

## 2020-10-02 LAB — CBC
HCT: 27.8 % — ABNORMAL LOW (ref 36.0–46.0)
Hemoglobin: 8.6 g/dL — ABNORMAL LOW (ref 12.0–15.0)
MCH: 28.3 pg (ref 26.0–34.0)
MCHC: 30.9 g/dL (ref 30.0–36.0)
MCV: 91.4 fL (ref 80.0–100.0)
Platelets: 149 10*3/uL — ABNORMAL LOW (ref 150–400)
RBC: 3.04 MIL/uL — ABNORMAL LOW (ref 3.87–5.11)
RDW: 16.3 % — ABNORMAL HIGH (ref 11.5–15.5)
WBC: 9.5 10*3/uL (ref 4.0–10.5)
nRBC: 0 % (ref 0.0–0.2)

## 2020-10-02 LAB — COMPREHENSIVE METABOLIC PANEL
ALT: 9 U/L (ref 0–44)
AST: 22 U/L (ref 15–41)
Albumin: 1.4 g/dL — ABNORMAL LOW (ref 3.5–5.0)
Alkaline Phosphatase: 181 U/L — ABNORMAL HIGH (ref 38–126)
Anion gap: 9 (ref 5–15)
BUN: 36 mg/dL — ABNORMAL HIGH (ref 8–23)
CO2: 26 mmol/L (ref 22–32)
Calcium: 7.8 mg/dL — ABNORMAL LOW (ref 8.9–10.3)
Chloride: 113 mmol/L — ABNORMAL HIGH (ref 98–111)
Creatinine, Ser: 1.53 mg/dL — ABNORMAL HIGH (ref 0.44–1.00)
GFR, Estimated: 34 mL/min — ABNORMAL LOW (ref >60)
Glucose, Bld: 139 mg/dL — ABNORMAL HIGH (ref 70–99)
Potassium: 4.9 mmol/L (ref 3.5–5.1)
Sodium: 148 mmol/L — ABNORMAL HIGH (ref 135–145)
Total Bilirubin: 0.9 mg/dL (ref 0.3–1.2)
Total Protein: 5.5 g/dL — ABNORMAL LOW (ref 6.5–8.1)

## 2020-10-02 LAB — GLUCOSE, CAPILLARY: Glucose-Capillary: 126 mg/dL — ABNORMAL HIGH (ref 70–99)

## 2020-10-02 NOTE — Progress Notes (Signed)
Palliative Medicine RN Note: Interpreter set up for PMT meeting tomorrow am at 0900. I have left a message for her daughter, and I have paged the attending service & spoken with them to request they attend.  Marjie Skiff Maudy Yonan, RN, BSN, [place] Medicine Team 10/02/2020 4:15 PM Office [phone]

## 2020-10-02 NOTE — Progress Notes (Signed)
Subjective:   Overnight, no acute events.  This morning, per bedside nursing staff, patient continuing to refuse medications, turns and foley care. Patient is requesting some food this morning as she is hungry. She denies any abdominal pain.  Objective:  Vital signs in last 24 hours: Vitals:   10/01/20 1045 10/01/20 1338 10/01/20 2123 10/02/20 0612  BP: (!) 117/47 (!) 121/50 (!) 112/54 138/63  Pulse: 78 83 86 87  Resp: 18 18 17    Temp: 98.8 F (37.1 C) 98.3 F (36.8 C) 97.9 F (36.6 C) 97.7 F (36.5 C)  TempSrc: Axillary Axillary Axillary Axillary  SpO2: 99% 97% 98% 98%  Weight:      Height:      On room air  Filed Weights   09/18/20 1158 09/21/20 0500  Weight: 90 kg 82.1 kg    Intake/Output Summary (Last 24 hours) at 10/02/2020 0910 Last data filed at 10/02/2020 8850 Gross per 24 hour  Intake 940 ml  Output 100 ml  Net 840 ml   Physical Exam Vitals and nursing note reviewed. Exam conducted with a chaperone present.  Constitutional:      General: She is not in acute distress.    Appearance: She is ill-appearing.  Cardiovascular:     Rate and Rhythm: Normal rate and regular rhythm.     Heart sounds: Normal heart sounds.  Pulmonary:     Effort: Pulmonary effort is normal. No respiratory distress.     Breath sounds: No rales.  Abdominal:     General: Abdomen is flat. Bowel sounds are normal. There is distension.     Palpations: Abdomen is soft.     Tenderness: There is no abdominal tenderness.  Musculoskeletal:     Right lower leg: 2+ Pitting Edema present.     Left lower leg: 2+ Pitting Edema present.  Neurological:     General: No focal deficit present.     Mental Status: She is alert.  Psychiatric:        Mood and Affect: Mood normal.        Behavior: Behavior normal.    CBC Latest Ref Rng & Units 10/02/2020 10/01/2020 09/30/2020  WBC 4.0 - 10.5 K/uL 9.5 8.9 12.2(H)  Hemoglobin 12.0 - 15.0 g/dL 8.6(L) 8.2(L) 8.1(L)  Hematocrit 36 - 46 % 27.8(L)  26.4(L) 26.1(L)  Platelets 150 - 400 K/uL 149(L) 126(L) 125(L)   CMP Latest Ref Rng & Units 10/02/2020 10/01/2020 09/30/2020  Glucose 70 - 99 mg/dL 139(H) 125(H) 108(H)  BUN 8 - 23 mg/dL 36(H) 35(H) 35(H)  Creatinine 0.44 - 1.00 mg/dL 1.53(H) 1.61(H) 1.83(H)  Sodium 135 - 145 mmol/L 148(H) 147(H) 148(H)  Potassium 3.5 - 5.1 mmol/L 4.9 4.7 4.7  Chloride 98 - 111 mmol/L 113(H) 117(H) 115(H)  CO2 22 - 32 mmol/L 26 25 26   Calcium 8.9 - 10.3 mg/dL 7.8(L) 7.6(L) 7.6(L)  Total Protein 6.5 - 8.1 g/dL 5.5(L) 5.0(L) 5.0(L)  Total Bilirubin 0.3 - 1.2 mg/dL 0.9 1.2 0.9  Alkaline Phos 38 - 126 U/L 181(H) 196(H) 200(H)  AST 15 - 41 U/L 22 31 34  ALT 0 - 44 U/L 9 10 9    Glucose (24 hours) - 126, 122, 138, 109  IMAGING: No results found.   Assessment/Plan:  Principal Problem:   Acute appendicitis Active Problems:   Protein-calorie malnutrition, severe ([place])   DNR (do not resuscitate) discussion   S/P appendectomy   Urinary tract infection due to ESBL Klebsiella   Adult failure to thrive  Ms.  Whitney [name] is a 85 year old female with past medical history significant for T2DM, CKD3, severe nutritional anasarca, severe physical impairment, chronic right hip fracture, recent admission for emphysematous cystitis and left-sided emphysematous pyelitis with ESBL klebsiella bacteremia who presented to [facility] on 11/08 with right lower quadrant abdominal pain found to have acute appendicitis s/p appendectomy on 11/08 with hospital course complicated by continued ESBL producing klebsiella pneumoniae urinary tract infection as well as acute cholecystitis.  #Acute cholecystitis, improving Patient without abdominal pain, nausea or vomiting over past three days. Alkaline phosphatase gradually trending down. No leukocytosis, transaminitis, or hyperbilirubinemia. Agree with general surgery and IR recommendations to not pursue cholecystectomy or percutaneous cholecystostomy. Patient received course of IV antibiotics  with meropenem and due to her clinical improvement, we will discontinue antibiotics at this time. If patient continues to improve while antibiotics are discontinued, she may be stable for discharge. -Discontinue meropenem -IV zofran PRN -CMP tomorrow morning  #AoCKD 3a, improving Patient's creatinine downtrending, most recently 1.53 (baseline 1.3). Patient's AKI developed in the setting of intravascular volume depletion, poor oral intake, and prior urinary tract infection. Patient's significantly low albumin limits the ability to provide IVF without causing additional extravasation of fluids, however she is improving on her own. -Monitor on CMP tomorrow morning -Discontinue Foley today -Continue Flomax  #Severe nutritional anasarca, chronic, stable #Hypoalbuminemia, chronic, stable #Cirrhosis, chronic, stable Patient with pitting edema of bilateral lower extremities and abdominal distention examination in the setting of her profound hypoalbuminemia and cirrhosis. -Consider outpatient GI follow-up upon discharge, if in line with patient and family goals -Boost supplementation TID -Assist with feeding patient  #Anemia of CKD, chronic, stable Hemoglobin stable, most recently 8.6 -CBC tomorrow morning  #Goals of care discussions, ongoing Patient has clearly stated to our team that she does not want aggressive interventions and would like to pass peacefully, however she defers to her daughter for her medical decision making who wants her to remain full code with full scope of care. -Patient to remain full code -Continue goals of care discussions -Patient to have palliative care services upon discharge  #T2DM, chronic, stable Patient's appetite significantly improved over last few days with patient requesting fluids and food. -CBG monitoring -Glucose goal 120-180 -Insulin PRN  #Asymptomatic external hemorrhoids, chronic, stable -Continue topical hydrocortisone cream twice daily  #ESBL  producing Klebsiella Pneumoniae UTI, resolved #Acute appendicitis s/p appendectomy, resolved  #VTE ppx: Lovenox 30mg  daily #Diet: Carb modified, thin fluids (although SLP would ideally want thickened fluids but patient declines) #Bowel regimen: Scheduled daily senna and miralax #Code status: Full code #PT/OT recs: No PT/OT follow up; supervision/assistance - 24 hour  Cato Mulligan, MD 10/02/2020, 9:10 AM Pager: [phone] After 5pm on weekdays and 1pm on weekends: On Call pager [phone]

## 2020-10-02 NOTE — Progress Notes (Signed)
14 Days Post-Op    CC: Abdominal pain  Subjective: Patient is in the room by herself.  She currently has clear liquids at her bedside is on a carb modified diet.  She is denying any abdominal discomfort.  Her abdomen remains distended, appendectomy port sites are all healing nicely.  She appears comfortable.  She speaks very little [place].  Objective: Vital signs in last 24 hours: Temp:  [97.7 F (36.5 C)-98.8 F (37.1 C)] 97.7 F (36.5 C) (11/22 0612) Pulse Rate:  [78-87] 87 (11/22 0612) Resp:  [17-18] 17 (11/21 2123) BP: (112-138)/(47-63) 138/63 (11/22 0612) SpO2:  [97 %-99 %] 98 % (11/22 0612) Last BM Date: 09/30/20 940 p.o. Urine 100 No other intake/output recorded Afebrile vital signs are stable Sodium 148, chloride 113, glucose 139, BUN 36, creatinine 1.53, alk phos 181, AST 22, ALT 9, total bilirubin 0.9, WBC 9.5 H/H 8.6/27.8 Platelets 149,000   Intake/Output from previous day: 11/21 0701 - 11/22 0700 In: 940 [P.O.:940] Out: 100 [Urine:100] Intake/Output this shift: No intake/output data recorded.  General appearance: alert, cooperative and no distress GI: Soft, mildly distended.  Port sites all look good.  She does not appear to be in pain on palpation and appears to be quite comfortable.  Lab Results:  Recent Labs    10/01/20 0326 10/02/20 0248  WBC 8.9 9.5  HGB 8.2* 8.6*  HCT 26.4* 27.8*  PLT 126* 149*    BMET Recent Labs    10/01/20 0326 10/02/20 0248  NA 147* 148*  K 4.7 4.9  CL 117* 113*  CO2 25 26  GLUCOSE 125* 139*  BUN 35* 36*  CREATININE 1.61* 1.53*  CALCIUM 7.6* 7.8*   PT/INR No results for input(s): LABPROT, INR in the last 72 hours.  Recent Labs  Lab 09/28/20 0307 09/29/20 0100 09/30/20 0244 10/01/20 0326 10/02/20 0248  AST 23 27 34 31 22  ALT 10 10 9 10 9   ALKPHOS 245* 257* 200* 196* 181*  BILITOT 0.9 1.0 0.9 1.2 0.9  PROT 5.6* 5.2* 5.0* 5.0* 5.5*  ALBUMIN 1.5* 1.8* 1.5* 1.4* 1.4*     Lipase     Component Value  Date/Time   LIPASE 14 09/18/2020 0800     Medications: . acetaminophen  650 mg Oral TID  . Chlorhexidine Gluconate Cloth  6 each Topical Daily  . enoxaparin (LOVENOX) injection  30 mg Subcutaneous Q24H  . hydrocortisone cream   Topical BID  . multivitamin with minerals  1 tablet Oral Daily  . nystatin  5 mL Oral QID  . polyethylene glycol  17 g Oral Daily  . senna  1 tablet Oral Daily  . tamsulosin  0.4 mg Oral Daily  . 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 Ninfa Linden Persistent nausea and vomiting  ?cholecystitis -surgical path consistent with acute appendicitis.However, she was noted to have a thin white fluid noted on the right side of her abdomen during surgery of unclear etiology.  - pt has had increased n/v post op and been complaining of more RUQ pain now - WBC normal and AST/ALT not elevated, alk phos 257 - RUQ [place] and CT show some distention  of gallbladder, minimal gallbladder wall thickening and cholelithiasis - this is all non-specific in a patient with cirrhosis - HIDA showed non-filling of the gallbladder suggestive of acute cholecystitis - I do not believe another surgery at this time is in patient's best interest, have placed consult for possible cholecystostomy tube   ID - rocephin/flagylpreop; merrem 11/10>> FEN -NPO  VTE - SCDs,Lovenox Foley - none   Plan: No surgical treatment is currently recommended continue IV antibiotics/IR cholecystectomy tube has been recommended.  LOS: 14 days    Carmon Sahli 10/02/2020 Please see  Amion

## 2020-10-02 NOTE — Progress Notes (Signed)
  Speech Language Pathology Treatment: Dysphagia  Patient Details Name: Whitney [name] MRN: [id number] DOB: [birth year] Today's Date: 10/02/2020 Time: 3825-0539 SLP Time Calculation (min) (ACUTE ONLY): 13 min  Assessment / Plan / Recommendation Clinical Impression  Pt was seen with assistance via teleinterpreter Myer Haff [phone]). Throughout session, pt and interpreter appeared to understand one another, with appropriate responses to questioning noted. Pt was hesitant to work with SLP initially, but did agree to straw sips of thin liquids. No overt s/s of aspiration were observed despite semi-reclined position (which is her norm), with pt taking sequential boluses although not three ounces in volume at a time to more fully challenge her system. Education was provided about the silent nature of aspiration, but pt denies having any trouble with thin liquids and still does not like the thicker liquids. Pt is adamant, when asked several times and in several ways, that she wants to stay on the thin liquids. SLP asked if I could contact her family to discuss this further, but she said no. Per SLP note on previous date, it appears as though thorough education was provided to pt, daughter, and son via telephone about the risks of aspiration with thin liquids with all family members preferring to accept the risk as pt was not drinking anything thickened. This also brings its own risks if she is not adequately hydrated. Education was also reportedly provided on previous date about the importance of oral care in pts who are at a higher risk of aspiration. Given that pt does not want further interventions on her swallowing, and pt and family have been educated, acute SLP f/u is not further indicated at this time. However, should pt/family change their mind or have any acute needs, please reorder [place] so that we can assist further.    HPI HPI: Ms.Heatherington is a 85 yo F w/ PMH of IDDm, CKD3, Hypoalbuminemia, Chronic R hip fracture  and recent admission for pyelonephritis with ESBL kleb bacteremia presenting to [facility] with abdominal pain due to appendicitis. Seen by SLP in 10/21: recommended dys 1 diet with thin liquids 2/2 instances of coughing and multiple swallows. SLP s/o as pt appeared to be at baseline functioning. CT abdomen: Lungs: Bibasilar atelectasis and effusions.       SLP Plan  Discharge SLP treatment due to (comment) (pt/family accepting risk with thin liquids)       Recommendations  Diet recommendations: Regular;Thin liquid Liquids provided via: Cup;Straw Medication Administration: Whole meds with puree Supervision: Intermittent supervision to cue for compensatory strategies Compensations: Slow rate;Small sips/bites Postural Changes and/or Swallow Maneuvers: Upright 30-60 min after meal (sit as upright as possible)                Oral Care Recommendations: Oral care BID Follow up Recommendations: 24 hour supervision/assistance SLP Visit Diagnosis: Dysphagia, pharyngoesophageal phase (R13.14) Plan: Discharge SLP treatment due to (comment) (pt/family accepting risk with thin liquids)       GO                [name]., M.A. [facility] Pager [phone] Office [phone]  10/02/2020, 1:39 PM

## 2020-10-02 NOTE — Progress Notes (Signed)
Patient refusing all cares including turns, taking meds, CHG, and peri and foley care. MD and daughter aware. Daughter is coming today and will help.

## 2020-10-03 DIAGNOSIS — R531 Weakness: Secondary | ICD-10-CM

## 2020-10-03 LAB — COMPREHENSIVE METABOLIC PANEL
ALT: 9 U/L (ref 0–44)
AST: 23 U/L (ref 15–41)
Albumin: 1.5 g/dL — ABNORMAL LOW (ref 3.5–5.0)
Alkaline Phosphatase: 186 U/L — ABNORMAL HIGH (ref 38–126)
Anion gap: 8 (ref 5–15)
BUN: 34 mg/dL — ABNORMAL HIGH (ref 8–23)
CO2: 26 mmol/L (ref 22–32)
Calcium: 7.8 mg/dL — ABNORMAL LOW (ref 8.9–10.3)
Chloride: 115 mmol/L — ABNORMAL HIGH (ref 98–111)
Creatinine, Ser: 1.42 mg/dL — ABNORMAL HIGH (ref 0.44–1.00)
GFR, Estimated: 37 mL/min — ABNORMAL LOW (ref >60)
Glucose, Bld: 150 mg/dL — ABNORMAL HIGH (ref 70–99)
Potassium: 5 mmol/L (ref 3.5–5.1)
Sodium: 149 mmol/L — ABNORMAL HIGH (ref 135–145)
Total Bilirubin: 1.4 mg/dL — ABNORMAL HIGH (ref 0.3–1.2)
Total Protein: 6 g/dL — ABNORMAL LOW (ref 6.5–8.1)

## 2020-10-03 LAB — CBC
HCT: 28.3 % — ABNORMAL LOW (ref 36.0–46.0)
Hemoglobin: 8.5 g/dL — ABNORMAL LOW (ref 12.0–15.0)
MCH: 28.2 pg (ref 26.0–34.0)
MCHC: 30 g/dL (ref 30.0–36.0)
MCV: 94 fL (ref 80.0–100.0)
Platelets: 180 10*3/uL (ref 150–400)
RBC: 3.01 MIL/uL — ABNORMAL LOW (ref 3.87–5.11)
RDW: 16.3 % — ABNORMAL HIGH (ref 11.5–15.5)
WBC: 7.5 10*3/uL (ref 4.0–10.5)
nRBC: 0 % (ref 0.0–0.2)

## 2020-10-03 LAB — ACID FAST CULTURE WITH REFLEXED SENSITIVITIES (MYCOBACTERIA): Acid Fast Culture: NEGATIVE

## 2020-10-03 LAB — GLUCOSE, CAPILLARY: Glucose-Capillary: 132 mg/dL — ABNORMAL HIGH (ref 70–99)

## 2020-10-03 MED ORDER — HYDROCORTISONE 1 % EX CREA: TOPICAL_CREAM | Freq: Two times a day (BID) | CUTANEOUS | 0 refills | Status: AC

## 2020-10-03 MED ORDER — PROSOURCE PLUS PO LIQD
30.0000 mL | Freq: Two times a day (BID) | ORAL | Status: DC
  Administered 2020-10-03 (×2): 30 mL via ORAL
  Filled 2020-10-03 (×2): qty 30

## 2020-10-03 MED ORDER — NYSTATIN 100000 UNIT/ML MT SUSP
5.0000 mL | Freq: Four times a day (QID) | OROMUCOSAL | 0 refills | Status: DC
Start: 2020-10-03 — End: 2020-10-27

## 2020-10-03 MED ORDER — PROSOURCE PLUS PO LIQD: 30.0000 mL | Freq: Two times a day (BID) | ORAL | 0 refills | Status: AC

## 2020-10-03 MED ORDER — BOOST / RESOURCE BREEZE PO LIQD CUSTOM: 1.0000 | Freq: Two times a day (BID) | ORAL | Status: DC

## 2020-10-03 NOTE — Progress Notes (Signed)
15 Days Post-Op    CC: Abdominal pain  Subjective: Palliative meeting about to start with primary service.  Patient still remains a little tender in the right upper quadrant.  She is eating some but not very much.  Her daughter is at the bedside and seems to understand we do not think she would do well with surgery, and continue to recommend conservative management.  Objective: Vital signs in last 24 hours: Temp:  [97.6 F (36.4 C)-97.7 F (36.5 C)] 97.7 F (36.5 C) (11/23 0451) Pulse Rate:  [84-90] 84 (11/23 0451) Resp:  [16-18] 16 (11/23 0451) BP: (118-126)/(44-56) 119/56 (11/23 0451) SpO2:  [99 %-100 %] 100 % (11/23 0451) Last BM Date: 09/30/20 568 p.o. 100 urine recorded No BM recorded Afebrile vital signs are stable Creatinine 1.42 LFTs are stable Bilirubin 1.4 WBC 7.5    Intake/Output from previous day: 11/22 0701 - 11/23 0700 In: 568 [P.O.:568] Out: 100 [Urine:100] Intake/Output this shift: No intake/output data recorded.  General appearance: alert, cooperative, no distress and Smiling and pleasant this AM. Resp: clear to auscultation bilaterally GI: Soft some tenderness persists in the right upper quadrant.  Lab Results:  Recent Labs    10/02/20 0248 10/03/20 0518  WBC 9.5 7.5  HGB 8.6* 8.5*  HCT 27.8* 28.3*  PLT 149* 180    BMET Recent Labs    10/02/20 0248 10/03/20 0518  NA 148* 149*  K 4.9 5.0  CL 113* 115*  CO2 26 26  GLUCOSE 139* 150*  BUN 36* 34*  CREATININE 1.53* 1.42*  CALCIUM 7.8* 7.8*   PT/INR No results for input(s): LABPROT, INR in the last 72 hours.  Recent Labs  Lab 09/29/20 0100 09/30/20 0244 10/01/20 0326 10/02/20 0248 10/03/20 0518  AST 27 34 31 22 23   ALT 10 9 10 9 9   ALKPHOS 257* 200* 196* 181* 186*  BILITOT 1.0 0.9 1.2 0.9 1.4*  PROT 5.2* 5.0* 5.0* 5.5* 6.0*  ALBUMIN 1.8* 1.5* 1.4* 1.4* 1.5*     Lipase     Component Value Date/Time   LIPASE 14 09/18/2020 0800     Medications: . acetaminophen  650 mg  Oral TID  . Chlorhexidine Gluconate Cloth  6 each Topical Daily  . enoxaparin (LOVENOX) injection  30 mg Subcutaneous Q24H  . hydrocortisone cream   Topical BID  . multivitamin with minerals  1 tablet Oral Daily  . nystatin  5 mL Oral QID  . polyethylene glycol  17 g Oral Daily  . senna  1 tablet Oral Daily  . tamsulosin  0.4 mg Oral Daily  . 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 Ninfa Linden Persistent nausea and vomiting  ?cholecystitis -surgical path consistent with acute appendicitis.However, she was noted to have a thin white fluid noted on the right side of her abdomen during surgery of unclear etiology.  - pt has had increased n/v post op and been complaining of more RUQ pain now - WBC normal andAST/ALTnot elevated, alk phos 257 - RUQ [place] and CT show some distention of gallbladder, minimal gallbladder wall thickening and cholelithiasis - this is all non-specific in a patient with cirrhosis -HIDA showed  non-filling of the gallbladder suggestive of acute cholecystitis - I do not believe another surgery at this time is in patient's best interest, have placed consult for possible cholecystostomy tube  ID - rocephin/flagylpreop; merrem 11/10>> FEN -NPO  VTE - SCDs,Lovenox Foley - none  Plan: Palliative consult with family pending; percutaneous cholecystostomy tube placement has been recommended in the setting of cirrhosis, ascites, high bleeding risk with surgery in/around the liver.  Recommend continued antibiotics.  No further surgical recommendations.   We do not believe she would benefit with surgical intervention.      LOS: 15 days    Whitney [name] 10/03/2020 Please see Amion

## 2020-10-03 NOTE — Discharge Summary (Addendum)
Name: Whitney [name] MRN: [id number] DOB: June 24, 1935 85 y.o. PCP: Patient, No Pcp Per  Date of Admission: 09/18/2020  5:35 AM Date of Discharge:  10/03/2020 Attending Physician: Oda Kilts, MD  Discharge Diagnosis: 1. Principal Problem:   Acute appendicitis Active Problems:   Protein-calorie malnutrition, severe ([place])   DNR (do not resuscitate) discussion   S/P appendectomy   Urinary tract infection due to ESBL Klebsiella   Adult failure to thrive  Discharge Medications: Allergies as of 10/03/2020       Reactions   Succinylcholine Other (See Comments)   No formal diagnosis of pseudocholinesterase deficiency; however, prolonged paralysis during anesthetics on 09/18/20 and 08/18/20.         Medication List     TAKE these medications    (feeding supplement) PROSource Plus liquid Take 30 mLs by mouth 2 (two) times daily between meals.   acetaminophen 500 MG tablet Commonly known as: TYLENOL Take 2 tablets by mouth every 8 (eight) hours as needed for moderate pain.   calcitonin (salmon) 200 UNIT/ACT nasal spray Commonly known as: MIACALCIN/FORTICAL Place 1 spray into alternate nostrils daily.   famotidine 20 MG tablet Commonly known as: PEPCID Take 1 tablet (20 mg total) by mouth 2 (two) times daily.   hydrocortisone cream 1 % Apply topically 2 (two) times daily.   ibuprofen 200 MG tablet Commonly known as: ADVIL Take 200-400 mg by mouth daily as needed for headache (pain).   insulin glargine 100 UNIT/ML Solostar Pen Commonly known as: LANTUS Inject 5 Units into the skin at bedtime.   multivitamin with minerals Tabs tablet Take 1 tablet by mouth daily.   nystatin 100000 UNIT/ML suspension Commonly known as: MYCOSTATIN Take 5 mLs (500,000 Units total) by mouth 4 (four) times daily.   oxyCODONE-acetaminophen 5-325 MG tablet Commonly known as: Percocet Take 1-2 tablets by mouth every 8 (eight) hours as needed for severe pain.   Pen Needles 30G X 5  MM Misc Use as instructed.   tamsulosin 0.4 MG Caps capsule Commonly known as: FLOMAX Take 1 capsule (0.4 mg total) by mouth daily.   Vitamin D (Ergocalciferol) 1.25 MG (50000 UNIT) Caps capsule Commonly known as: DRISDOL Take 1 capsule (50,000 Units total) by mouth every 7 (seven) days.               Durable Medical Equipment  (From admission, onward)           Start     Ordered   10/03/20 1308  DME Oxygen  Once       Question Answer Comment  Length of Need 6 Months   Mode or (Route) Nasal cannula   Liters per Minute 1   Frequency Continuous (stationary and portable oxygen unit needed)   Oxygen conserving device Yes   Oxygen delivery system Gas      10/03/20 1309           Disposition and follow-up:   Whitney [name] was discharged from [facility] in [place] condition.  At the hospital follow up visit please address:  1.  Acute cholecystitis: Patient with acute cholecystitis treated with IV meropenem as general surgery determined not to be a surgical candidate and interventional radiology determined patient not a candidate for percutaneous cholecystostomy tube. Patient's symptoms and laboratory work improved for several days prior to discharge. At hospital follow-up, assess patient's abdominal pain. Consider obtaining CMP. Unfortunately, patient is likely to have recurrence of symptoms due to her gallstones.  Urinary tract  infection: Patient treated with additional ten-day course of IV meropenem for continued growth of ESBL producing Klebsiella Pneumoniae on urine culture. Patient's urinary symptoms resolved. Consider obtaining UA and CBC if recurrence of urinary symptoms.  Goals of care: Extensive goals of care discussions during hospitalization. Patient and daughter desire Full Code with aggressive surgical and medical management of her condition. However, also expressed the desire to have her pain controlled and stay at home. Continue discussing  goals of care.  2.  Labs / imaging needed at time of follow-up: CBC, CMP, UA  3.  Pending labs/ test needing follow-up: None  Follow-up Appointments:  Follow-up Information     Whitney Ebbs, MD. Schedule an appointment as soon as possible for a visit.   Specialty: Internal Medicine Contact information: [place] [address] [phone]         [facility] Follow up.   Specialty: PALLIATIVE CARE Contact information: [place] [phone]        Coralie Keens, MD. Go on 10/10/2020.   Specialty: General Surgery Why: Follow up appointment scheduled for 9:50 AM. Please arrive to your appointment 30 minutes prior to your appointment. Please bring a copy of your photo ID and insurance card.  Contact information: [place] [address] [phone]                Hospital Course by problem list: #Acute appendicitis s/p appendectomy Patient presented to the emergency department with right lower quadrant abdominal pain and found to have evidence of early appendicitis on CT Abdomen/Pelvis. General surgery was consulted who elected to proceed with appendectomy. Patient went to the OR the same day and had appendectomy without complications. However, a thin white fluid was noted around the appendix and liver intraoperatively. Patient had minimal right-lower quadrant abdominal pain following the operation and surgical pathology was consistent with acute appendicitis.  #ESBL producing Klebsiella Pneumoniae UTI Following the appendectomy, patient was found to have continued suprapubic abdominal pain. Urinalysis was suspicious for urinary tract infection and follow-up urine culture showed continued growth of ESBL producing Klebsiella Pneumoniae. Infectious disease was consulted who recommended providing patient with additional ten days of IV meropenem for her infection which was in  addition to the 14-day course that she received during her prior hospitalization for emphysematous cystitis and left-sided pyelitis in October. Patient had resolution of her suprapubic abdominal pain following course of antibiotics. Additionally, she did not have leukocytosis or fevers following treatment.  #Acute cholecystitis Patient developed significant right middle and right upper quadrant abdominal pain along with nausea and vomiting during her hospitalization. Her alkaline phosphatase and bilirubin were noted to be significantly elevated. She underwent repeat CT Abdomen  Pelvis, RUQ ultrasound and HIDA scan which were consistent with acute cholecystitis. General surgery and IR were consulted, both of whom recommended medical management for her condition and declined offering cholecystectomy or percutaneous cholecystostomy tube due to the high risk of complication with her overall medical complexity as well as cirrhosis and ascites. She improved symptomatically with continuation of her IV meropenem and had resolution of her hyperbilirubinemia with stabilization of alkaline phosphatase level slightly above the upper limit of normal. Patient and patient's daughter have been advised of the likelihood of recurrence of her symptoms given our inability to surgically address her underlying condition.   #AoCKD 3a Patient developed acute kidney injury with elevation of her creatinine >2 during hospitalization. Following treatment of her acute conditions, her creatinine  trended down to her baseline of 1.4. Patient's AKI developed in the setting of intravascular volume depletion, poor oral intake, and prior urinary tract infection. Patient's significantly low albumin limited the ability to provide IVF without causing additional extravasation of fluids, however she improved on her own.   #Severe nutritional anasarca, chronic #Hypoalbuminemia, chronic #Cirrhosis, chronic Patient with pitting edema of bilateral  lower extremities and abdominal distention examination in the setting of her profound hypoalbuminemia and cirrhosis. RD consulted and recommended continuation of boost breeze supplementation twice daily as well as prosource plus supplementation twice daily.   #Anemia of CKD, chronic Hemoglobin stable, most recently 8.5. She required one unit of blood transfusion during her hospitalization for a hemoglobin of 7.0.   #Goals of care discussions Due to complicated acute and chronic medical conditions, palliative care team was consulted who recommended a family meeting with the use of a chaplain, [place] interpreter, as well as our team. Patient evaluated at bedside with daughter, chaplain, palliative care NP, attending physician and myself on day of discharge. Patient reported to our team that she feels well and would like to go home. Her primary desire is to not be in pain, however, per discussions with her daughter, she desires whatever medical or surgical interventions that we can offer her to treat her acute and chronic medical issues. We discussed that her appendicitis, urinary tract infection, and acute kidney injury have since resolved, however, we can not treat her acute cholecystitis surgically at this time as this would be a very high risk procedure for Ms. Strother in the context of her overall condition and recent appendectomy. We reassured Ms. Mericle and her daughter that her acute cholecystitis has improved following continuation of IV meropenem, however she still has gallstones present that can block her cystic duct/common bile duct and result in recurrence or exacerbation of her symptoms. We also discussed the likelihood repeat urinary tract infection and our concern of worsening antibiotic resistance.  She has been repeatedly reevaluated by SLP and recommended for thick liquids, but she prefers to drink thin liquids and they understand the risks associated with this choice.  Ms. Broner and her daughter  desire for her to go home with her daughter's support and home health services. We discussed the benefits and risks of this plan and provided anticipatory guidance regarding the need to return to the emergency department if she were to develop new or worsening symptoms.   #T2DM Patient placed on sliding scale insulin throughout her hospitalization without any significant hyperglycemic or hypoglycemic episodes.   #Asymptomatic external hemorrhoids Patient noted to have asymptomatic external hemorrhoids. Treatment with topical hydrocortisone twice daily was started.  Discharge Vitals:   BP (!) 119/56 (BP Location: Left Arm)   Pulse 84   Temp 97.7 F (36.5 C) (Axillary)   Resp 16   Ht 5\' 3"  (1.6 m)   Wt 82.1 kg   SpO2 100%   BMI 32.06 kg/m   Pertinent Labs, Studies, and Procedures:  CBC Latest Ref Rng & Units 10/03/2020 10/02/2020 10/01/2020  WBC 4.0 - 10.5 K/uL 7.5 9.5 8.9  Hemoglobin 12.0 - 15.0 g/dL 8.5(L) 8.6(L) 8.2(L)  Hematocrit 36 - 46 % 28.3(L) 27.8(L) 26.4(L)  Platelets 150 - 400 K/uL 180 149(L) 126(L)   CMP Latest Ref Rng & Units 10/03/2020 10/02/2020 10/01/2020  Glucose 70 - 99 mg/dL 150(H) 139(H) 125(H)  BUN 8 - 23 mg/dL 34(H) 36(H) 35(H)  Creatinine 0.44 - 1.00 mg/dL 1.42(H) 1.53(H) 1.61(H)  Sodium 135 - 145  mmol/L 149(H) 148(H) 147(H)  Potassium 3.5 - 5.1 mmol/L 5.0 4.9 4.7  Chloride 98 - 111 mmol/L 115(H) 113(H) 117(H)  CO2 22 - 32 mmol/L 26 26 25   Calcium 8.9 - 10.3 mg/dL 7.8(L) 7.8(L) 7.6(L)  Total Protein 6.5 - 8.1 g/dL 6.0(L) 5.5(L) 5.0(L)  Total Bilirubin 0.3 - 1.2 mg/dL 1.4(H) 0.9 1.2  Alkaline Phos 38 - 126 U/L 186(H) 181(H) 196(H)  AST 15 - 41 U/L 23 22 31   ALT 0 - 44 U/L 9 9 10    CT ABDOMEN PELVIS WO CONTRAST  Result Date: 09/28/2020 CLINICAL DATA:  Bowel obstruction, nausea, vomiting EXAM: CT ABDOMEN AND PELVIS WITHOUT CONTRAST TECHNIQUE: Multidetector CT imaging of the abdomen and pelvis was performed following the standard protocol without IV  contrast. COMPARISON:  09/20/2020 FINDINGS: Lower chest: Small to moderate bilateral pleural effusions are present, similar to prior examination. There is associated mild bibasilar atelectasis. Mild right coronary artery calcification. Calcification of the vital valve annulus noted. Global cardiac size is at the upper limits of normal. Hepatobiliary: Liver unremarkable. The gallbladder is mildly distended and there is mild pericholecystic fluid, nonspecific in the setting of ascites, but subtle gallbladder wall thickening also noted. Cholelithiasis noted. Together, the findings are suspicious for changes of acute cholecystitis. These findings, however, are stable since prior examination. No intra or extrahepatic biliary ductal dilation. Pancreas: Atrophic, but otherwise unremarkable Spleen: Unremarkable Adrenals/Urinary Tract: The adrenal glands are unremarkable. The kidneys are normal in size and position. Simple cortical cyst noted within the left kidney. Left ureteral stent is in place extending from the left upper pole calices to the bladder. There is no hydronephrosis. No nephro or urolithiasis. The bladder is decompressed with a Foley catheter balloon seen within its lumen. Stomach/Bowel: The stomach is mildly distended, similar to that noted on prior examination, nonspecific. This can be seen the setting of mild gastric atony. Contrast from a prior swallowing study has passed into the colon, arguing against an underlying obstruction. The small and large bowel are unremarkable. The appendix is normal. Mild ascites. No free intraperitoneal gas. Vascular/Lymphatic: Infrarenal inferior vena cava filter is unchanged, demonstrating moderate strut penetration. No aortic aneurysm. Arteriosclerosis of the a visualized lower extremity arterial outflow noted. No aortic aneurysm. No pathologic abdominal or pelvic adenopathy. Reproductive: Uterus and bilateral adnexa are unremarkable. Other: There is diffuse subcutaneous  body wall edema, progressive since prior examination. Musculoskeletal: Chronic osteolysis of the right femoral head and dislocation of the right hip is again noted. Revision type left hip arthroplasty has been performed with methylmethacrylate reconstruction of the acetabular cup. Marked protrusio again identified. Degenerative changes are seen within the lumbar spine. Stable remote L1 compression fracture. No lytic or blastic bone lesion is seen. Dystrophic calcification is seen within the soft tissues posterior to the left iliac spine likely related to remote trauma or inflammation. This is unchanged. IMPRESSION: Increasing anasarca with increasing ascites and progressive body wall edema. Small Bob lateral pleural effusions appears stable. Stable findings of gallbladder distension, subtle gallbladder wall thickening, and cholelithiasis suspicious for changes of acute cholecystitis. These appears stable since prior examination. Given the findings of a recent sonogram of 09/21/2020, hepatic biliary scintigraphy could be considered for evaluation of cystic duct patency. Additional incidental findings as noted above. Aortic Atherosclerosis (ICD10-I70.0). Electronically Signed   By: Fidela Salisbury MD   On: 09/28/2020 02:44   CT ABDOMEN PELVIS WO CONTRAST  Result Date: 09/20/2020 CLINICAL DATA:  RIGHT lower quadrant pain. Appendectomy September 18, 2020. EXAM: CT  ABDOMEN AND PELVIS WITHOUT CONTRAST TECHNIQUE: Multidetector CT imaging of the abdomen and pelvis was performed following the standard protocol without IV contrast. COMPARISON:  None. FINDINGS: Lower chest: Bibasilar atelectasis and effusions. Findings similar comparison exam 2 days prior. Hepatobiliary: Mild gallbladder distension similar to prior. Single dependent 1 cm gallstone. Small fluid surrounds the margin liver similar to prior. Pancreas: Pancreas is normal. No ductal dilatation. No pancreatic inflammation. Spleen: Normal spleen Adrenals/urinary  tract: Adrenal glands normal. There is retained contrast within the kidneys from scan 2 days prior suggesting renal insufficiency. There is a double-J ureteral stent on the LEFT. No hydronephrosis. Foley catheter within collapsed bladder. Stomach/Bowel: Stomach, small bowel cecum normal. Post appendectomy. No abnormal fluid collections in the RIGHT lower quadrant. Ascending, transverse and descending colon normal. The bowel is predominantly collapsed. There is stool in the rectum. Vascular/Lymphatic: IVC filter in infrarenal IVC. Several small lymph nodes surround the upper aorta. No pelvic lymphadenopathy. Reproductive: Calcified leiomyoma uterus.  No adnexal abnormality Other: Moderate free fluid along the pericolic gutters. Musculoskeletal: No aggressive osseous lesion. Chronic severe arthropathy of the RIGHT hip joint with erosion of the RIGHT femoral head. LEFT hip prosthetic. No acute findings. IMPRESSION: 1. No clear complication following appendectomy. No fluid collection in the RIGHT lower quadrant. 2. Moderate volume of intraperitoneal free fluid similar to comparison exam. 3. Mild distension of gallbladder could related to fasting state. Several gallstones noted. Consider gallbladder ultrasound for further evaluation. 4. Moderate bilateral pleural effusions and passive atelectasis similar prior. 5. Retention of IV contrast within the kidneys from CT scan 2 days prior. Findings suggest nephro toxicity/renal insufficiency. Electronically Signed   By: Suzy Bouchard M.D.   On: 09/20/2020 17:19   DG Abd 1 View  Result Date: 09/25/2020 CLINICAL DATA:  Abdominal pain and distension EXAM: ABDOMEN - 1 VIEW COMPARISON:  August 22, 2020 FINDINGS: Double-J stent present on the left. There is contrast in portions of stomach and colon. No bowel dilatation or air-fluid level to suggest bowel obstruction. No free air. Total hip replacement on the left. Inferior vena cava present. IMPRESSION: No bowel obstruction  or free air. Double-J stent again noted on the left. Total hip replacement on the left. Filter in inferior vena cava. Electronically Signed   By: Lowella Grip III M.D.   On: 09/25/2020 09:51   NM Hepatobiliary Liver Func  Result Date: 09/28/2020 CLINICAL DATA:  Abdominal pain. EXAM: NUCLEAR MEDICINE HEPATOBILIARY IMAGING TECHNIQUE: Sequential images of the abdomen were obtained out to 60 minutes following intravenous administration of radiopharmaceutical. RADIOPHARMACEUTICALS:  5.3 mCi Tc-26m  Choletec IV COMPARISON:  CT 09/27/2020 FINDINGS: Prompt clearance of radiotracer from blood pool and homogeneous uptake in liver. Counts are evident in the small bowel by 20 minutes. At 75 minutes, the gallbladder had not filled. 3 mg of IV morphine were administered to augment filling of the gallbladder. Gallbladder failed to fill following administration of IV morphine. IMPRESSION: Non filling of the gallbladder implies obstruction of the cystic duct (acute cholecystitis). False positives can occur with prolonged fasting state or opioid administration. These results will be called to the ordering clinician or representative by the Radiologist Assistant, and communication documented in the PACS or [employer]. Electronically Signed   By: Suzy Bouchard M.D.   On: 09/28/2020 20:08   CT ABDOMEN PELVIS W CONTRAST  Result Date: 09/18/2020 CLINICAL DATA:  Abdominal pain, nausea, vomiting EXAM: CT ABDOMEN AND PELVIS WITH CONTRAST TECHNIQUE: Multidetector CT imaging of the abdomen and pelvis was performed  using the standard protocol following bolus administration of intravenous contrast. CONTRAST:  69mL OMNIPAQUE IOHEXOL 300 MG/ML  SOLN COMPARISON:  08/20/2020 FINDINGS: Lower chest: Coronary calcifications. Increase in small bilateral pleural effusions. Stable bleb at the right lung base. Increase in interstitial opacities in the lung bases, and patchy consolidation/atelectasis posteriorly at the right lung  base. Hepatobiliary: Mildly nodular hepatic contour without focal lesion. Several partially calcified stones in the dependent aspect of the lumen of the mildly distended gallbladder. No biliary ductal dilatation. Pancreas: Advanced parenchymal atrophy without focal lesion or ductal dilatation. Spleen: Normal in size without focal abnormality. Adrenals/Urinary Tract: Adrenal glands unremarkable. Focal parenchymal loss in the upper pole right kidney, stable. Left double-J ureteral stent in good position. Mild fullness of the central renal collecting system and proximal ureterectasis without overt hydronephrosis. Urinary bladder is distended containing small amount of gas suggesting recent instrumentation. 2.7 cm exophytic probable cyst from the lower pole left kidney. Stomach/Bowel: The stomach is nondilated. The small bowel is decompressed. Appendix: Location: Retrocecal Diameter: 9 mm Appendicolith: None seen Mucosal hyper-enhancement: Indeterminate Extraluminal gas: None seen Periappendiceal collection: No loculated collection The colon is non distended. There is fecal dilatation of the rectum up to 7.9 cm. Vascular/Lymphatic: Aorta unremarkable. Infrarenal IVC filter with caval wall penetration by filter legs involving the aorta and L3 vertebral body. Stable subcentimeter left para-aortic and aortocaval lymph nodes. For no pelvic adenopathy. Prominent supraceliac and gastrohepatic ligament lymph nodes measuring up to 0.9 cm short axis diameter. Reproductive: Stable coarse uterine calcifications. No adnexal mass. Other: New small volume abdominal ascites predominantly perihepatic, perisplenic, and left lower quadrant. No free air. Musculoskeletal: Ventral hernia containing only mesenteric fat. Stable 3.8 cm presumed injection granuloma posterior to the left SI joint. Stable L1 and L3 compression deformities. Chronic fracture-dislocation of the right hip resorption of much of the femoral head. Left hip arthroplasty  components are partially visualized, with chronic protrusio and attempts at cement restoration. No acute fracture or worrisome bone lesion. IMPRESSION: 1. Dilated appendix with equivocal mucosal hyper-enhancement, may represent early appendicitis, without evidence of perforation or abscess. 2. New small volume abdominal ascites. 3. Increase in small bilateral pleural effusions. 4. Cholelithiasis. 5. Coronary calcifications. 6. Additional chronic findings as above. Aortic Atherosclerosis (ICD10-I70.0). Electronically Signed   By: Lucrezia Europe M.D.   On: 09/18/2020 08:17   DG Swallowing Func-Speech Pathology  Result Date: 09/21/2020 Objective Swallowing Evaluation: Type of Study: Bedside Swallow Evaluation  Patient Details Name: Amore Ackman MRN: [id number] Date of Birth: 09-Nov-1935 Today's Date: 09/21/2020 Time: SLP Start Time (ACUTE ONLY): 1330 -SLP Stop Time (ACUTE ONLY): 1350 SLP Time Calculation (min) (ACUTE ONLY): 20 min Past Medical History: Past Medical History: Diagnosis Date  CKD (chronic kidney disease)   Complication of anesthesia   PER FAMILY PATIENT HAD A SEIZURE WHEN " THEY PUT A ROD IN HER LEG "  Diabetes mellitus without complication ([place])  Past Surgical History: Past Surgical History: Procedure Laterality Date  APPENDECTOMY  09/18/2020  CYSTOSCOPY W/ URETERAL STENT PLACEMENT Left 08/18/2020  Procedure: CYSTOSCOPY WITH RETROGRADE PYELOGRAM/URETERAL STENT PLACEMENT;  Surgeon: [name], MD;  Location: [place];  Service: Urology;  Laterality: Left;  IR FLUORO GUIDE CV LINE RIGHT  08/23/2020  IR REMOVAL TUN CV CATH W/O FL  08/31/2020  IR US GUIDE VASC ACCESS RIGHT  08/23/2020  LAPAROSCOPIC APPENDECTOMY N/A 09/18/2020  Procedure: APPENDECTOMY LAPAROSCOPIC;  Surgeon: Coralie Keens, MD;  Location: [place];  Service: General;  Laterality: N/A;  TOTAL HIP ARTHROPLASTY Right  03/05/2020  Procedure: HIP GIRDLE STONE;  Surgeon: Leandrew Koyanagi, MD;  Location: [place];  Service: Orthopedics;  Laterality: Right;  HPI: Ms.Whitesel is a 85 yo F w/ PMH of IDDm, CKD3, Hypoalbuminemia, Chronic R hip fracture and recent admission for pyelonephritis with ESBL kleb bacteremia presenting to [facility] with abdominal pain due to appendicitis. Seen by SLP in 10/21: recommended dys 1 diet with thin liquids 2/2 instances of coughing and multiple swallows. SLP s/o as pt appeared to be at baseline functioning. CT abdomen: Lungs: Bibasilar atelectasis and effusions.  Subjective: pt very uncomfortable in bed Assessment / Plan / Recommendation CHL IP CLINICAL IMPRESSIONS 09/21/2020 Clinical Impression Pt presents with pharyngeal dysphagia characterized by a pharyngeal delay which resulted in penetration (PAS 3,5) with thin liquids and penetration (PAS 2) with nectar thick liquids. Penetration of thin liquids inconsistently resulted in silent trace aspiration (PAS 8). Depth of penetration was improved with use of smaller bolus sizes but ultimate aspiration was still inconsistently demonstrated. It is noteworthy that the study was conducted under optimal conditions with the pt sitting fully upright. However, considering the pt and her daughter's reports of pt having p.o. intake in a reclined position, her risk of aspiration would be notably increased. It is anticipated that quantity and frequency of aspiration increases with pt in a reclined position. Mastication was prolonged with regular textures but this did not appear to significantly impact safety. Esophageal screening revealed retention of barium in the mid to lower thoracic esophagus; determination of the etiology of this is beyond the scope of this study; however, pt's daughter did report the pt having esophageal-related issues since birth. A regular texture diet with nectar thick liquids is recommended at this time. SLP will follow for dysphagia treatment.  SLP Visit Diagnosis Dysphagia, pharyngoesophageal phase (R13.14) Attention and concentration deficit following -- Frontal lobe and executive  function deficit following -- Impact on safety and function Moderate aspiration risk;Mild aspiration risk   CHL IP TREATMENT RECOMMENDATION 09/21/2020 Treatment Recommendations Therapy as outlined in treatment plan below   Prognosis 09/21/2020 Prognosis for Safe Diet Advancement Good Barriers to Reach Goals Other (Comment);Time post onset Barriers/Prognosis Comment -- CHL IP DIET RECOMMENDATION 09/21/2020 SLP Diet Recommendations Regular solids;Nectar thick liquid Liquid Administration via Cup;Straw Medication Administration Whole meds with puree Compensations Minimize environmental distractions;Slow rate;Small sips/bites Postural Changes Seated upright at 90 degrees   CHL IP OTHER RECOMMENDATIONS 09/21/2020 Recommended Consults -- Oral Care Recommendations Oral care BID Other Recommendations --   CHL IP FOLLOW UP RECOMMENDATIONS 09/21/2020 Follow up Recommendations Home health SLP   CHL IP FREQUENCY AND DURATION 09/21/2020 Speech Therapy Frequency (ACUTE ONLY) min 2x/week Treatment Duration 2 weeks      CHL IP ORAL PHASE 09/21/2020 Oral Phase WFL Oral - Pudding Teaspoon -- Oral - Pudding Cup -- Oral - Honey Teaspoon -- Oral - Honey Cup -- Oral - Nectar Teaspoon -- Oral - Nectar Cup -- Oral - Nectar Straw -- Oral - Thin Teaspoon -- Oral - Thin Cup -- Oral - Thin Straw -- Oral - Puree -- Oral - Mech Soft -- Oral - Regular -- Oral - Multi-Consistency -- Oral - Pill -- Oral Phase - Comment --  CHL IP PHARYNGEAL PHASE 09/21/2020 Pharyngeal Phase Impaired Pharyngeal- Pudding Teaspoon -- Pharyngeal -- Pharyngeal- Pudding Cup -- Pharyngeal -- Pharyngeal- Honey Teaspoon -- Pharyngeal -- Pharyngeal- Honey Cup -- Pharyngeal -- Pharyngeal- Nectar Teaspoon -- Pharyngeal -- Pharyngeal- Nectar Cup Delayed swallow initiation-vallecula;Penetration/Aspiration during swallow Pharyngeal Material enters airway, remains ABOVE vocal  cords then ejected out Pharyngeal- Nectar Straw Delayed swallow  initiation-vallecula;Penetration/Aspiration during swallow Pharyngeal Material enters airway, remains ABOVE vocal cords then ejected out Pharyngeal- Thin Teaspoon -- Pharyngeal -- Pharyngeal- Thin Cup Delayed swallow initiation-vallecula;Penetration/Aspiration during swallow Pharyngeal Material enters airway, remains ABOVE vocal cords and not ejected out;Material enters airway, passes BELOW cords without attempt by patient to eject out (silent aspiration) Pharyngeal- Thin Straw Delayed swallow initiation-vallecula;Penetration/Aspiration during swallow Pharyngeal Material enters airway, remains ABOVE vocal cords and not ejected out;Material enters airway, CONTACTS cords and not ejected out;Material enters airway, passes BELOW cords without attempt by patient to eject out (silent aspiration) Pharyngeal- Puree Delayed swallow initiation-vallecula Pharyngeal -- Pharyngeal- Mechanical Soft -- Pharyngeal -- Pharyngeal- Regular Delayed swallow initiation-vallecula Pharyngeal -- Pharyngeal- Multi-consistency Delayed swallow initiation-vallecula Pharyngeal -- Pharyngeal- Pill Delayed swallow initiation-vallecula;Penetration/Aspiration during swallow  Pharyngeal -- Pharyngeal Comment --  CHL IP CERVICAL ESOPHAGEAL PHASE 09/21/2020 Cervical Esophageal Phase Impaired Pudding Teaspoon -- Pudding Cup -- Honey Teaspoon -- Honey Cup -- Nectar Teaspoon -- Nectar Cup -- Nectar Straw -- Thin Teaspoon -- Thin Cup -- Thin Straw (No Data); See impressions Puree -- Mechanical Soft -- Regular -- Multi-consistency -- Pill -- Cervical Esophageal Comment -- Tobie Poet I. Hardin Negus, [place], [place] Office number [phone] Pager [phone] Horton Marshall 09/21/2020, 2:27 PM              US Abdomen Limited RUQ (LIVER/GB)  Result Date: 09/21/2020 CLINICAL DATA:  Right upper quadrant pain EXAM: ULTRASOUND ABDOMEN LIMITED RIGHT UPPER QUADRANT COMPARISON:  CT 1 day prior FINDINGS: Gallbladder: There is  cholelithiasis without evidence for gallbladder wall thickening. There is gallbladder sludge. The sonographic Percell Miller sign is reported as positive. Common bile duct: Diameter: 3 mm Liver: The liver is heterogeneous with a nodular contour. Portal vein is patent on color Doppler imaging with normal direction of blood flow towards the liver. Other: There is a right-sided pleural effusion. There is a small volume of ascites. IMPRESSION: 1. Findings are equivocal for acute calculus cholecystitis. While the sonographic Percell Miller sign is positive in the presence of gallstones, there is no gallbladder wall thickening. 2. Cirrhotic appearing liver. 3. Small volume ascites. 4. Incidentally noted right-sided pleural effusion. Electronically Signed   By: Constance Holster M.D.   On: 09/21/2020 19:49   Discharge Instructions: Discharge Instructions     Call MD for:  difficulty breathing, headache or visual disturbances   Complete by: As directed    Call MD for:  extreme fatigue   Complete by: As directed    Call MD for:  hives   Complete by: As directed    Call MD for:  persistant dizziness or light-headedness   Complete by: As directed    Call MD for:  persistant nausea and vomiting   Complete by: As directed    Call MD for:  redness, tenderness, or signs of infection (pain, swelling, redness, odor or green/yellow discharge around incision site)   Complete by: As directed    Call MD for:  severe uncontrolled pain   Complete by: As directed    Call MD for:  temperature >100.4   Complete by: As directed    Diet - low sodium heart healthy   Complete by: As directed    Increase activity slowly   Complete by: As directed    No wound care   Complete by: As directed       Signed: Cato Mulligan, MD 10/03/2020, 1:09 PM   Pager: [phone]

## 2020-10-03 NOTE — Progress Notes (Signed)
Subjective:   Overnight, no acute events.  This morning, patient evaluated at bedside with daughter, chaplain, palliative care NP, attending physician and myself. Patient reported to our team that she feels well and would like to go home, however, she continues to have mild right-sided abdominal pain. Her primary desire is to not be in pain, however, per discussions with her daughter, she desires whatever medical or surgical interventions that we can offer her to treat her acute and chronic medical issues. We discussed that her appendicitis, urinary tract infection, and acute kidney injury have since resolved, however, we can not treat her acute cholecystitis surgically at this time as this would be a very high risk procedure for Whitney [name] in the context of her overall condition and recent appendectomy. We reassured Whitney [name] and her daughter that her acute cholecystitis has improved following continuation of IV meropenem, however she still has gallstones present that can block her cystic duct/common bile duct and result in recurrence or exacerbation of her symptoms. We also discussed the likelihood repeat urinary tract infection and our concern of worsening antibiotic resistance. Whitney [name] and her daughter desire for her to go home with her daughter's support and home health services. We discussed the benefits and risks of this plan and provided anticipatory guidance regarding the need to return to the emergency department if she were to develop new or worsening symptoms.  Objective:  Vital signs in last 24 hours: Vitals:   10/02/20 0612 10/02/20 1800 10/02/20 2103 10/03/20 0451  BP: 138/63 (!) 126/44 (!) 118/55 (!) 119/56  Pulse: 87 90 84 84  Resp:  18 16 16   Temp: 97.7 F (36.5 C) 97.6 F (36.4 C) 97.7 F (36.5 C) 97.7 F (36.5 C)  TempSrc: Axillary Axillary Axillary Axillary  SpO2: 98% 99% 100% 100%  Weight:      Height:      On room air  Filed Weights   09/18/20 1158 09/21/20 0500    Weight: 90 kg 82.1 kg    Intake/Output Summary (Last 24 hours) at 10/03/2020 0656 Last data filed at 10/03/2020 0319 Gross per 24 hour  Intake 568 ml  Output 100 ml  Net 468 ml   Physical Exam Vitals and nursing note reviewed. Exam conducted with a chaperone present.  Constitutional:      General: She is not in acute distress.    Appearance: She is ill-appearing.  Cardiovascular:     Rate and Rhythm: Normal rate and regular rhythm.     Heart sounds: Normal heart sounds.  Pulmonary:     Effort: Pulmonary effort is normal. No respiratory distress.     Breath sounds: No rales.  Abdominal:     General: Abdomen is flat. Bowel sounds are normal. There is distension.     Palpations: Abdomen is soft.     Tenderness: There is no abdominal tenderness.  Musculoskeletal:     Right lower leg: 2+ Pitting Edema present.     Left lower leg: 2+ Pitting Edema present.  Skin:    Comments: Ecchymoses of right antecubital fossa at sate of peripheral IV insertion  Neurological:     General: No focal deficit present.     Mental Status: She is alert.  Psychiatric:        Mood and Affect: Mood normal.        Behavior: Behavior normal.    CBC Latest Ref Rng & Units 10/03/2020 10/02/2020 10/01/2020  WBC 4.0 - 10.5 K/uL 7.5 9.5 8.9  Hemoglobin 12.0 - 15.0 g/dL 8.5(L) 8.6(L) 8.2(L)  Hematocrit 36 - 46 % 28.3(L) 27.8(L) 26.4(L)  Platelets 150 - 400 K/uL 180 149(L) 126(L)   CMP Latest Ref Rng & Units 10/03/2020 10/02/2020 10/01/2020  Glucose 70 - 99 mg/dL 150(H) 139(H) 125(H)  BUN 8 - 23 mg/dL 34(H) 36(H) 35(H)  Creatinine 0.44 - 1.00 mg/dL 1.42(H) 1.53(H) 1.61(H)  Sodium 135 - 145 mmol/L 149(H) 148(H) 147(H)  Potassium 3.5 - 5.1 mmol/L 5.0 4.9 4.7  Chloride 98 - 111 mmol/L 115(H) 113(H) 117(H)  CO2 22 - 32 mmol/L 26 26 25   Calcium 8.9 - 10.3 mg/dL 7.8(L) 7.8(L) 7.6(L)  Total Protein 6.5 - 8.1 g/dL 6.0(L) 5.5(L) 5.0(L)  Total Bilirubin 0.3 - 1.2 mg/dL 1.4(H) 0.9 1.2  Alkaline Phos 38 - 126  U/L 186(H) 181(H) 196(H)  AST 15 - 41 U/L 23 22 31   ALT 0 - 44 U/L 9 9 10    Glucose (24 hours) - 132  IMAGING: No results found.   Assessment/Plan:  Principal Problem:   Acute appendicitis Active Problems:   Protein-calorie malnutrition, severe ([place])   DNR (do not resuscitate) discussion   S/P appendectomy   Urinary tract infection due to ESBL Klebsiella   Adult failure to thrive  Whitney [name] is a 85 year old female with past medical history significant for T2DM, CKD3, severe nutritional anasarca, severe physical impairment, chronic right hip fracture, recent admission for emphysematous cystitis and left-sided emphysematous pyelitis with ESBL klebsiella bacteremia who presented to [facility] on 11/08 with right lower quadrant abdominal pain found to have acute appendicitis s/p appendectomy on 11/08 with hospital course complicated by continued ESBL producing klebsiella pneumoniae urinary tract infection as well as acute cholecystitis.  #Acute cholecystitis, resolved Patient without abdominal pain, nausea or vomiting this morning. Alkaline phosphatase gradually trending down and now stable. No leukocytosis or transamintis. Agree with general surgery and IR recommendations to not pursue cholecystectomy or percutaneous cholecystostomy. Patient received course of IV antibiotics with meropenem and due to her clinical improvement, we discontinued antibiotics. Patient and patient's daughter have been advised on the likelihood of recurrence of her symptoms given our inability to surgically address her underlying condition.  #AoCKD 3a, resolved Patient's creatinine downtrending, most recently 1.4 (baseline 1.3). Patient's AKI developed in the setting of intravascular volume depletion, poor oral intake, and prior urinary tract infection. Patient's significantly low albumin limits the ability to provide IVF without causing additional extravasation of fluids, however she is improving on her  own.  #Severe nutritional anasarca, chronic, stable #Hypoalbuminemia, chronic, stable #Cirrhosis, chronic, stable Patient with pitting edema of bilateral lower extremities and abdominal distention examination in the setting of her profound hypoalbuminemia and cirrhosis. -Appreciate RD recommendations:  -Boost breeze PO BID  -59mL Prosource Plus BID -Assist with feeding patient  #Anemia of CKD, chronic, stable Hemoglobin stable, most recently 8.5.  #Goals of care discussions, ongoing Patient has clearly stated to our team that she does not want aggressive interventions and would like to pass peacefully, however she defers to her daughter for her medical decision making who wants her to remain full code with full scope of care. Please, refer to subjective component of note for today's discussion. -Patient to remain full code -Continue goals of care discussions -Patient to have palliative care services upon discharge  #T2DM, chronic, stable Patient's appetite significantly improved over last few days with patient requesting fluids and food. -CBG monitoring -Glucose goal 120-180 -Insulin PRN  #Asymptomatic external hemorrhoids, chronic, stable -Continue topical hydrocortisone cream twice  daily  #ESBL producing Klebsiella Pneumoniae UTI, resolved #Acute appendicitis s/p appendectomy, resolved  #VTE ppx: Lovenox 30mg  daily #Diet: Carb modified, thin fluids (although SLP would ideally want thickened fluids but patient declines) #Bowel regimen: Scheduled daily senna and miralax #Code status: Full code #PT/OT recs: No PT/OT follow up; supervision/assistance - 24 hour  Cato Mulligan, MD 10/03/2020, 6:56 AM Pager: [phone] After 5pm on weekdays and 1pm on weekends: On Call pager [phone]

## 2020-10-03 NOTE — Progress Notes (Signed)
Oxygen saturation remain in 94-96 % without oxygen.

## 2020-10-03 NOTE — Progress Notes (Signed)
Patient ID: Whitney [name], female   DOB: 07-30-1935, 85 y.o.   MRN: [id number]  85 year old female with past medical history of IDDM, CKD3, hypoalbuminemia, chronic right hip fracture, recent admission for emphysematous cystitis and left-sided emphysematous pyelitis with ESBL klebsiella bacteremia who presented to [facility] on 11/08 with right lower quadrant abdominal pain found to have acute appendicitis s/p appendectomy on 11/08.  1. Seen on CT A/P 08/17/2020.S/p cystoscopy, left ureteral stent placement on 08/18/2020.CT A/P 08/20/2020 with decompressed left renal collecting system with no gas in the collecting system and bladder decompressed around a Foley catheter with no further gas within the bladder.Received prolonged course of antibiotics and ultimately discharged home. Represented with ESWL Klebsiella.CT A/P 09/20/2020 revealed left ureteral stent placement in appropriate position with no hydronephrosis and collapsed bladder around a Foley catheter.   This nurse practitioner reviewed medical records and received report from team and then visited  patient at bedside. Patient remains weak, with poor po intake,. Although patient "feels better" and looks better today, she is high risk for decompensation.  Met at bedside as scheduled with interpretor, daughter, Dr Wynetta Emery, Dr Rebeca Alert and Ellen/Chaplain and myself.  Again patient today clearly verbalizes that her daughter be decision maker regarding medical issues.    Education offered regarding concern for long term poor prognosis, the medical interventions  are temporizing medical interventions.    She is high risk for decompensation.  Overall failure to thrive.     She is not a surgical candidate. The difference between an aggressive medical intervention path and a palliative comfort path for this patient at this time in this situation was detailed.  Ongoing education offered on concepts of adult failure to thrive and the patient's high risk for  decompensation. Education offered on the limitations of medical interventions to prolong quality of life when the body fails to thrive.  Once patient is stable for discharge daughter wishes for her to return home with her, with home heath services.  Not interested in a hospice/comfort approach at this time.   Recommend OP community based pallaitive services  Raised awareness to the importance of continued conversation with all family members and the  medical providers regarding overall plan of care and treatment options,  ensuring decisions are within the context of the patients values and GOCs.   Questions and concerns addressed   PMT will continue to support holistically.  Total time spent on the unit was 35 minutes  Greater than 50% of the time was spent in counseling and coordination of care  Wadie Lessen NP  Palliative Medicine Team Team Phone # [phone] Pager [phone]

## 2020-10-03 NOTE — Progress Notes (Signed)
This chaplain is present with PMT and MDs for a family meeting.  The [name] is bedside along with [facility] interpreter. The Pt. is awake and alert to presence of the medical team.  During the family meeting the interpreter gave opportunity for the Pt. to speak for herself.  The chaplain understands the Pt. is trusting the daughter for all healthcare decisions.  The family's decision is to prepare the Pt. for d/c to the daughter's home with continued treatment as needed.  The chaplain remained in the Pt. room for follow up with [name] after the family meeting.  [name] openly shared her need for financial assistance with November's rent, having exhausted the resources the extended family can offer.  The chaplain understands Caro Hight is looking for resources outside the Muslim community and will appreciate assistance from the Pt. case manager.    The Pt. is resting comfortably after the visit and [name] is open to F/U spiritual care.

## 2020-10-03 NOTE — Progress Notes (Signed)
Nutrition Follow-up  DOCUMENTATION CODES:   Obesity unspecified  INTERVENTION:   -MVI with minerals daily -Boost Breeze po BID, each supplement provides 250 kcal and 9 grams of protein -30 ml Prosource Plus BID, each supplement provides 100 kcals and 15 grams protein  NUTRITION DIAGNOSIS:   Inadequate oral intake related to poor appetite as evidenced by per patient/family report, meal completion < 25%.  Ongoing  GOAL:   Patient will meet greater than or equal to 90% of their needs  Progressing  MONITOR:   PO intake, Supplement acceptance  REASON FOR ASSESSMENT:   Consult Assessment of nutrition requirement/status  ASSESSMENT:   Pt with PMH of IDDM, CKD3, and recent admission for pyelonephritis (08/18/20). Admitted with RLQ abdominal pain found to have acute appendicitis. Appendectomy on 11/8.  11/11- s/p MBSS- downgraded to regular diet with nectar thick liquids 11/21s/p MBSS- advanced to thin liquids; pt and family accept the risk of silent aspiration  Reviewed I/O's: +468 ml x 24 hours and +1.6 L since 09/19/20  UOP: 100 ml x 24 hors  Attempted to speak with pt via call to hospital room phone, however, unable to reach.   Per MD notes, pt remains a poor surgical candidate. General surgery recommending perc chole tube.   Pt's intake has improved since last visit. Noted meal completion 10-80% (avergaing around 50% of meals). Pt is now on thin liquids per her request. Per chart review, pt daughter requesting thin liquids and accepts risk of aspiration as pt did not like the thickened liquids and was not consuming much when she received them.   Plan for family meeting with in-person [place] interpreter to further discuss goals of care.    Labs reviewed: Na: 149, CBGS: 126-132 (inpatient orders for glycemic control are none).   Diet Order:   Diet Order            Diet Carb Modified Fluid consistency: Thin; Room service appropriate? Yes  Diet effective now                  EDUCATION NEEDS:   Education needs have been addressed  Skin:  Skin Assessment: Skin Integrity Issues: Skin Integrity Issues:: Incisions Stage II: - Incisions: closed abdomen  Last BM:  09/30/20  Height:   Ht Readings from Last 1 Encounters:  09/18/20 5\' 3"  (1.6 m)    Weight:   Wt Readings from Last 1 Encounters:  09/21/20 82.1 kg    Ideal Body Weight:  115 kg  BMI:  Body mass index is 32.06 kg/m.  Estimated Nutritional Needs:   Kcal:  1600-1800  Protein:  85-100 grams  Fluid:  >/= 1.6L/day    Loistine Chance, RD, LDN, [place] Registered Dietitian II Certified Diabetes Care and Education Specialist Please refer to [facility] for RD and/or RD on-call/weekend/after hours pager

## 2020-10-03 NOTE — TOC Progression Note (Addendum)
Transition of Care [facility]) - Progression Note    Patient Details  Name: Whitney [name] MRN: [id number] Date of Birth: 07-Oct-1935  Transition of [facility] CM/SW Contact  Jacalyn Lefevre Edson Snowball, RN Phone Number: 10/03/2020, 1:32 PM  Clinical Narrative:     Called daughter Caro Hight [phone] to confirm address again to arrange PTAR.   Daughter requesting assistance with paying rent. Provided resources over the phone and will send paper copy with patient. [name] requesting hospital to pay her rent prior to her mother being discharged. [name] claims she has called the resources already and they do not help unless you have a eviction notice which she does not. [name] became upset and requested NCM supervisor call her. TOC Lead Olga Coaster will call [name]. Lead called daughter.   NCM called daughter back regarding PTAR transport left voicemail. Daughter called back confirmed address, however, she wants PTAR arranged for 11 pm tonight.   Called PTAR requested 11pm tonight. PTAR paperwork in drawer.   Expected Discharge Plan: Home/Self Care Barriers to Discharge: Continued Medical Work up  Expected Discharge Plan and Services Expected Discharge Plan: Home/Self Care   Discharge Planning Services: CM Consult   Living arrangements for the past 2 months: Single Family Home Expected Discharge Date: 10/03/20                 DME Agency: NA                   Social Determinants of Health (SDOH) Interventions    Readmission Risk Interventions No flowsheet data found.

## 2020-10-03 NOTE — Discharge Instructions (Signed)
Whitney [name],  It was our pleasure having the privilege to treat you during your recent hospitalization. You initially presented to the hospital with lower abdominal pain and you were found to have acute appendicitis requiring an appendectomy. Following the operation, you had continued abdominal pain and were found to have continuation of your prior urinary tract infection. We treated this condition with 10 additional days of IV meropenem (an antibiotic) with improvement in your symptoms. Your hospital course was complicated by the development of acute cholecystitis (inflammation of the gallbladder) with improvement in your vitals and laboratory work with continuation of the IV meropenem. Following discontinuing your IV antibiotics, you had stabilization of your vitals, laboratory work, and symptoms. We feel that at this point, you are stable for discharge home with continued support from your daughter and palliative care services. If you are to develop new or worsening, please come back to the emergency department for evaluation.  Sincerely, Dr. Paulla Dolly, MD

## 2020-10-04 NOTE — Progress Notes (Signed)
PTAR called to verified if patient is on the list for transport. Spoke with Whitney [name] who verified she is still on the list for transport

## 2020-10-04 NOTE — Progress Notes (Signed)
Patient's daughter called an updated she has been pick up by [facility] and she is on her way home

## 2020-10-27 ENCOUNTER — Encounter (HOSPITAL_COMMUNITY): Payer: Self-pay | Admitting: Pharmacy Technician

## 2020-10-27 ENCOUNTER — Emergency Department (HOSPITAL_COMMUNITY): Payer: 59

## 2020-10-27 ENCOUNTER — Inpatient Hospital Stay (HOSPITAL_COMMUNITY)
Admission: EM | Admit: 2020-10-27 | Discharge: 2020-12-12 | DRG: 871 | Disposition: E | Payer: 59 | Attending: Critical Care Medicine | Admitting: Critical Care Medicine

## 2020-10-27 DIAGNOSIS — Z4659 Encounter for fitting and adjustment of other gastrointestinal appliance and device: Secondary | ICD-10-CM

## 2020-10-27 DIAGNOSIS — R571 Hypovolemic shock: Secondary | ICD-10-CM | POA: Diagnosis present

## 2020-10-27 DIAGNOSIS — Z794 Long term (current) use of insulin: Secondary | ICD-10-CM

## 2020-10-27 DIAGNOSIS — N179 Acute kidney failure, unspecified: Secondary | ICD-10-CM | POA: Diagnosis present

## 2020-10-27 DIAGNOSIS — N184 Chronic kidney disease, stage 4 (severe): Secondary | ICD-10-CM | POA: Diagnosis present

## 2020-10-27 DIAGNOSIS — L8990 Pressure ulcer of unspecified site, unspecified stage: Secondary | ICD-10-CM | POA: Diagnosis present

## 2020-10-27 DIAGNOSIS — E875 Hyperkalemia: Secondary | ICD-10-CM | POA: Diagnosis not present

## 2020-10-27 DIAGNOSIS — B957 Other staphylococcus as the cause of diseases classified elsewhere: Secondary | ICD-10-CM | POA: Diagnosis present

## 2020-10-27 DIAGNOSIS — L89312 Pressure ulcer of right buttock, stage 2: Secondary | ICD-10-CM | POA: Diagnosis present

## 2020-10-27 DIAGNOSIS — Z66 Do not resuscitate: Secondary | ICD-10-CM | POA: Diagnosis present

## 2020-10-27 DIAGNOSIS — E876 Hypokalemia: Secondary | ICD-10-CM | POA: Diagnosis not present

## 2020-10-27 DIAGNOSIS — K810 Acute cholecystitis: Secondary | ICD-10-CM | POA: Diagnosis present

## 2020-10-27 DIAGNOSIS — Z789 Other specified health status: Secondary | ICD-10-CM

## 2020-10-27 DIAGNOSIS — A419 Sepsis, unspecified organism: Secondary | ICD-10-CM

## 2020-10-27 DIAGNOSIS — Y95 Nosocomial condition: Secondary | ICD-10-CM | POA: Diagnosis not present

## 2020-10-27 DIAGNOSIS — E43 Unspecified severe protein-calorie malnutrition: Secondary | ICD-10-CM | POA: Diagnosis present

## 2020-10-27 DIAGNOSIS — E1122 Type 2 diabetes mellitus with diabetic chronic kidney disease: Secondary | ICD-10-CM | POA: Diagnosis present

## 2020-10-27 DIAGNOSIS — I959 Hypotension, unspecified: Secondary | ICD-10-CM

## 2020-10-27 DIAGNOSIS — K766 Portal hypertension: Secondary | ICD-10-CM | POA: Diagnosis present

## 2020-10-27 DIAGNOSIS — Z96641 Presence of right artificial hip joint: Secondary | ICD-10-CM | POA: Diagnosis present

## 2020-10-27 DIAGNOSIS — M84459A Pathological fracture, hip, unspecified, initial encounter for fracture: Secondary | ICD-10-CM | POA: Diagnosis present

## 2020-10-27 DIAGNOSIS — E872 Acidosis, unspecified: Secondary | ICD-10-CM

## 2020-10-27 DIAGNOSIS — J189 Pneumonia, unspecified organism: Secondary | ICD-10-CM | POA: Diagnosis not present

## 2020-10-27 DIAGNOSIS — I4891 Unspecified atrial fibrillation: Secondary | ICD-10-CM | POA: Diagnosis not present

## 2020-10-27 DIAGNOSIS — Z888 Allergy status to other drugs, medicaments and biological substances status: Secondary | ICD-10-CM | POA: Diagnosis not present

## 2020-10-27 DIAGNOSIS — R34 Anuria and oliguria: Secondary | ICD-10-CM | POA: Diagnosis not present

## 2020-10-27 DIAGNOSIS — Z79899 Other long term (current) drug therapy: Secondary | ICD-10-CM

## 2020-10-27 DIAGNOSIS — Z87440 Personal history of urinary (tract) infections: Secondary | ICD-10-CM

## 2020-10-27 DIAGNOSIS — I482 Chronic atrial fibrillation, unspecified: Secondary | ICD-10-CM | POA: Diagnosis present

## 2020-10-27 DIAGNOSIS — K8013 Calculus of gallbladder with acute and chronic cholecystitis with obstruction: Secondary | ICD-10-CM | POA: Diagnosis present

## 2020-10-27 DIAGNOSIS — Z20822 Contact with and (suspected) exposure to covid-19: Secondary | ICD-10-CM | POA: Diagnosis present

## 2020-10-27 DIAGNOSIS — D689 Coagulation defect, unspecified: Secondary | ICD-10-CM | POA: Diagnosis present

## 2020-10-27 DIAGNOSIS — R6521 Severe sepsis with septic shock: Secondary | ICD-10-CM

## 2020-10-27 DIAGNOSIS — J9601 Acute respiratory failure with hypoxia: Secondary | ICD-10-CM | POA: Diagnosis not present

## 2020-10-27 DIAGNOSIS — Z7401 Bed confinement status: Secondary | ICD-10-CM

## 2020-10-27 DIAGNOSIS — R627 Adult failure to thrive: Secondary | ICD-10-CM | POA: Diagnosis present

## 2020-10-27 DIAGNOSIS — Q349 Congenital malformation of respiratory system, unspecified: Secondary | ICD-10-CM

## 2020-10-27 DIAGNOSIS — N183 Chronic kidney disease, stage 3 unspecified: Secondary | ICD-10-CM | POA: Diagnosis present

## 2020-10-27 DIAGNOSIS — R54 Age-related physical debility: Secondary | ICD-10-CM | POA: Diagnosis present

## 2020-10-27 DIAGNOSIS — Z6837 Body mass index (BMI) 37.0-37.9, adult: Secondary | ICD-10-CM

## 2020-10-27 DIAGNOSIS — K7460 Unspecified cirrhosis of liver: Secondary | ICD-10-CM | POA: Diagnosis present

## 2020-10-27 DIAGNOSIS — K819 Cholecystitis, unspecified: Secondary | ICD-10-CM

## 2020-10-27 DIAGNOSIS — IMO0002 Reserved for concepts with insufficient information to code with codable children: Secondary | ICD-10-CM | POA: Diagnosis present

## 2020-10-27 DIAGNOSIS — R579 Shock, unspecified: Secondary | ICD-10-CM | POA: Diagnosis not present

## 2020-10-27 DIAGNOSIS — A4159 Other Gram-negative sepsis: Principal | ICD-10-CM | POA: Diagnosis present

## 2020-10-27 DIAGNOSIS — E1165 Type 2 diabetes mellitus with hyperglycemia: Secondary | ICD-10-CM | POA: Diagnosis not present

## 2020-10-27 DIAGNOSIS — B961 Klebsiella pneumoniae [K. pneumoniae] as the cause of diseases classified elsewhere: Secondary | ICD-10-CM | POA: Diagnosis present

## 2020-10-27 DIAGNOSIS — R680 Hypothermia, not associated with low environmental temperature: Secondary | ICD-10-CM | POA: Diagnosis not present

## 2020-10-27 DIAGNOSIS — Z8619 Personal history of other infectious and parasitic diseases: Secondary | ICD-10-CM

## 2020-10-27 DIAGNOSIS — N12 Tubulo-interstitial nephritis, not specified as acute or chronic: Secondary | ICD-10-CM | POA: Diagnosis present

## 2020-10-27 DIAGNOSIS — G9341 Metabolic encephalopathy: Secondary | ICD-10-CM | POA: Diagnosis not present

## 2020-10-27 DIAGNOSIS — Z91011 Allergy to milk products: Secondary | ICD-10-CM

## 2020-10-27 DIAGNOSIS — N261 Atrophy of kidney (terminal): Secondary | ICD-10-CM | POA: Diagnosis present

## 2020-10-27 DIAGNOSIS — D696 Thrombocytopenia, unspecified: Secondary | ICD-10-CM | POA: Diagnosis not present

## 2020-10-27 DIAGNOSIS — R601 Generalized edema: Secondary | ICD-10-CM | POA: Diagnosis not present

## 2020-10-27 DIAGNOSIS — E669 Obesity, unspecified: Secondary | ICD-10-CM | POA: Diagnosis present

## 2020-10-27 DIAGNOSIS — R188 Other ascites: Secondary | ICD-10-CM | POA: Diagnosis not present

## 2020-10-27 DIAGNOSIS — A414 Sepsis due to anaerobes: Secondary | ICD-10-CM | POA: Diagnosis not present

## 2020-10-27 DIAGNOSIS — R109 Unspecified abdominal pain: Secondary | ICD-10-CM | POA: Diagnosis present

## 2020-10-27 DIAGNOSIS — N281 Cyst of kidney, acquired: Secondary | ICD-10-CM | POA: Diagnosis present

## 2020-10-27 DIAGNOSIS — D649 Anemia, unspecified: Secondary | ICD-10-CM | POA: Diagnosis not present

## 2020-10-27 DIAGNOSIS — Z9049 Acquired absence of other specified parts of digestive tract: Secondary | ICD-10-CM

## 2020-10-27 LAB — CBC WITH DIFFERENTIAL/PLATELET
Abs Immature Granulocytes: 0 10*3/uL (ref 0.00–0.07)
Basophils Absolute: 0 10*3/uL (ref 0.0–0.1)
Basophils Relative: 0 %
Eosinophils Absolute: 0 10*3/uL (ref 0.0–0.5)
Eosinophils Relative: 0 %
HCT: 35.4 % — ABNORMAL LOW (ref 36.0–46.0)
Hemoglobin: 10.4 g/dL — ABNORMAL LOW (ref 12.0–15.0)
Lymphocytes Relative: 4 %
Lymphs Abs: 0.4 10*3/uL — ABNORMAL LOW (ref 0.7–4.0)
MCH: 28.6 pg (ref 26.0–34.0)
MCHC: 29.4 g/dL — ABNORMAL LOW (ref 30.0–36.0)
MCV: 97.3 fL (ref 80.0–100.0)
Monocytes Absolute: 0.3 10*3/uL (ref 0.1–1.0)
Monocytes Relative: 3 %
Neutro Abs: 8.7 10*3/uL — ABNORMAL HIGH (ref 1.7–7.7)
Neutrophils Relative %: 93 %
Platelets: 250 10*3/uL (ref 150–400)
RBC: 3.64 MIL/uL — ABNORMAL LOW (ref 3.87–5.11)
RDW: 19.9 % — ABNORMAL HIGH (ref 11.5–15.5)
WBC: 9.4 10*3/uL (ref 4.0–10.5)
nRBC: 0.4 % — ABNORMAL HIGH (ref 0.0–0.2)
nRBC: 1 /100 WBC — ABNORMAL HIGH

## 2020-10-27 LAB — COMPREHENSIVE METABOLIC PANEL
ALT: 12 U/L (ref 0–44)
AST: 28 U/L (ref 15–41)
Albumin: 1.2 g/dL — ABNORMAL LOW (ref 3.5–5.0)
Alkaline Phosphatase: 88 U/L (ref 38–126)
Anion gap: 17 — ABNORMAL HIGH (ref 5–15)
BUN: 42 mg/dL — ABNORMAL HIGH (ref 8–23)
CO2: 23 mmol/L (ref 22–32)
Calcium: 7.4 mg/dL — ABNORMAL LOW (ref 8.9–10.3)
Chloride: 103 mmol/L (ref 98–111)
Creatinine, Ser: 2.04 mg/dL — ABNORMAL HIGH (ref 0.44–1.00)
GFR, Estimated: 24 mL/min — ABNORMAL LOW (ref >60)
Glucose, Bld: 161 mg/dL — ABNORMAL HIGH (ref 70–99)
Potassium: 3.5 mmol/L (ref 3.5–5.1)
Sodium: 143 mmol/L (ref 135–145)
Total Bilirubin: 1.1 mg/dL (ref 0.3–1.2)
Total Protein: 6.3 g/dL — ABNORMAL LOW (ref 6.5–8.1)

## 2020-10-27 LAB — PROTIME-INR
INR: 1.5 — ABNORMAL HIGH (ref 0.8–1.2)
Prothrombin Time: 17.1 seconds — ABNORMAL HIGH (ref 11.4–15.2)

## 2020-10-27 LAB — RESP PANEL BY RT-PCR (FLU A&B, COVID) ARPGX2
Influenza A by PCR: NEGATIVE
Influenza B by PCR: NEGATIVE
SARS Coronavirus 2 by RT PCR: NEGATIVE

## 2020-10-27 LAB — LACTIC ACID, PLASMA
Lactic Acid, Venous: 5.3 mmol/L (ref 0.5–1.9)
Lactic Acid, Venous: 8 mmol/L (ref 0.5–1.9)

## 2020-10-27 LAB — I-STAT CHEM 8, ED
BUN: 40 mg/dL — ABNORMAL HIGH (ref 8–23)
Calcium, Ion: 0.89 mmol/L — CL (ref 1.15–1.40)
Chloride: 104 mmol/L (ref 98–111)
Creatinine, Ser: 1.8 mg/dL — ABNORMAL HIGH (ref 0.44–1.00)
Glucose, Bld: 157 mg/dL — ABNORMAL HIGH (ref 70–99)
HCT: 34 % — ABNORMAL LOW (ref 36.0–46.0)
Hemoglobin: 11.6 g/dL — ABNORMAL LOW (ref 12.0–15.0)
Potassium: 3.6 mmol/L (ref 3.5–5.1)
Sodium: 142 mmol/L (ref 135–145)
TCO2: 23 mmol/L (ref 22–32)

## 2020-10-27 LAB — TYPE AND SCREEN
ABO/RH(D): A POS
Antibody Screen: NEGATIVE

## 2020-10-27 LAB — APTT: aPTT: 32 seconds (ref 24–36)

## 2020-10-27 LAB — CBG MONITORING, ED: Glucose-Capillary: 157 mg/dL — ABNORMAL HIGH (ref 70–99)

## 2020-10-27 MED ORDER — HYDROCORTISONE NA SUCCINATE PF 100 MG IJ SOLR
50.0000 mg | Freq: Four times a day (QID) | INTRAMUSCULAR | Status: DC
  Administered 2020-10-27 – 2020-11-05 (×33): 50 mg via INTRAVENOUS
  Filled 2020-10-27 (×34): qty 2

## 2020-10-27 MED ORDER — LACTATED RINGERS IV BOLUS (SEPSIS): 1000.0000 mL | Freq: Once | INTRAVENOUS | Status: DC

## 2020-10-27 MED ORDER — CALCIUM GLUCONATE-NACL 1-0.675 GM/50ML-% IV SOLN
1.0000 g | Freq: Once | INTRAVENOUS | Status: AC
  Administered 2020-10-27: 22:00:00 1000 mg via INTRAVENOUS
  Filled 2020-10-27: qty 50

## 2020-10-27 MED ORDER — POLYETHYLENE GLYCOL 3350 17 G PO PACK: 17.0000 g | PACK | Freq: Every day | ORAL | Status: DC | PRN

## 2020-10-27 MED ORDER — LACTATED RINGERS IV BOLUS (SEPSIS)
1000.0000 mL | Freq: Once | INTRAVENOUS | Status: AC
  Administered 2020-10-27: 18:00:00 1000 mL via INTRAVENOUS

## 2020-10-27 MED ORDER — INSULIN ASPART 100 UNIT/ML ~~LOC~~ SOLN
0.0000 [IU] | SUBCUTANEOUS | Status: DC
  Administered 2020-10-27: 22:00:00 2 [IU] via SUBCUTANEOUS
  Administered 2020-10-28: 20:00:00 1 [IU] via SUBCUTANEOUS
  Administered 2020-10-28 (×4): 2 [IU] via SUBCUTANEOUS
  Administered 2020-10-29 – 2020-10-31 (×12): 1 [IU] via SUBCUTANEOUS
  Administered 2020-10-31: 21:00:00 2 [IU] via SUBCUTANEOUS
  Administered 2020-10-31 – 2020-11-01 (×8): 1 [IU] via SUBCUTANEOUS
  Administered 2020-11-02 (×3): 2 [IU] via SUBCUTANEOUS
  Administered 2020-11-02: 09:00:00 1 [IU] via SUBCUTANEOUS
  Administered 2020-11-02 – 2020-11-03 (×5): 2 [IU] via SUBCUTANEOUS

## 2020-10-27 MED ORDER — SODIUM CHLORIDE 0.9 % IV SOLN
500.0000 mg | Freq: Once | INTRAVENOUS | Status: AC
  Administered 2020-10-27: 20:00:00 500 mg via INTRAVENOUS
  Filled 2020-10-27 (×2): qty 0.5

## 2020-10-27 MED ORDER — MIDAZOLAM HCL 2 MG/2ML IJ SOLN: 2.0000 mg | Freq: Once | INTRAMUSCULAR | Status: AC

## 2020-10-27 MED ORDER — MIDAZOLAM HCL 2 MG/2ML IJ SOLN
INTRAMUSCULAR | Status: AC
  Administered 2020-10-27: 22:00:00 2 mg via INTRAVENOUS
  Filled 2020-10-27: qty 2

## 2020-10-27 MED ORDER — SODIUM CHLORIDE 0.9 % IV SOLN
2.0000 g | Freq: Once | INTRAVENOUS | Status: DC
  Filled 2020-10-27: qty 2

## 2020-10-27 MED ORDER — FENTANYL CITRATE (PF) 100 MCG/2ML IJ SOLN
40.0000 ug | INTRAMUSCULAR | Status: DC | PRN
  Administered 2020-10-27 – 2020-10-31 (×3): 40 ug via INTRAVENOUS
  Filled 2020-10-27 (×5): qty 2

## 2020-10-27 MED ORDER — PIPERACILLIN-TAZOBACTAM 3.375 G IVPB
3.3750 g | Freq: Once | INTRAVENOUS | Status: DC
  Filled 2020-10-27: qty 50

## 2020-10-27 MED ORDER — LACTATED RINGERS IV SOLN: INTRAVENOUS | Status: AC

## 2020-10-27 MED ORDER — NOREPINEPHRINE 4 MG/250ML-% IV SOLN
0.0000 ug/min | INTRAVENOUS | Status: DC
  Administered 2020-10-27: 19:00:00 15 ug/min via INTRAVENOUS
  Administered 2020-10-27: 20 ug/min via INTRAVENOUS
  Filled 2020-10-27 (×2): qty 250

## 2020-10-27 MED ORDER — LACTATED RINGERS IV BOLUS (SEPSIS)
1000.0000 mL | Freq: Once | INTRAVENOUS | Status: AC
  Administered 2020-10-27: 19:00:00 1000 mL via INTRAVENOUS

## 2020-10-27 MED ORDER — LACTATED RINGERS IV BOLUS: 1000.0000 mL | Freq: Once | INTRAVENOUS | Status: DC

## 2020-10-27 MED ORDER — LACTATED RINGERS IV BOLUS
1000.0000 mL | Freq: Once | INTRAVENOUS | Status: AC
  Administered 2020-10-27: 19:00:00 1000 mL via INTRAVENOUS

## 2020-10-27 MED ORDER — DOCUSATE SODIUM 100 MG PO CAPS
100.0000 mg | ORAL_CAPSULE | Freq: Two times a day (BID) | ORAL | Status: DC | PRN
Start: 2020-10-27 — End: 2020-11-16

## 2020-10-27 MED ORDER — SODIUM CHLORIDE 0.9 % IV SOLN: INTRAVENOUS | Status: DC | PRN

## 2020-10-27 MED ORDER — LACTATED RINGERS IV BOLUS (SEPSIS)
1000.0000 mL | Freq: Once | INTRAVENOUS | Status: AC
  Administered 2020-10-27: 20:00:00 1000 mL via INTRAVENOUS

## 2020-10-27 MED ORDER — ALBUMIN HUMAN 5 % IV SOLN
25.0000 g | Freq: Once | INTRAVENOUS | Status: AC
  Administered 2020-10-28: 01:00:00 25 g via INTRAVENOUS
  Filled 2020-10-27: qty 500

## 2020-10-27 NOTE — ED Notes (Signed)
Pt with +fast per Dr. Reather Converse

## 2020-10-27 NOTE — Progress Notes (Signed)
Quick update note:  Since our IMTS team recently saw this patient, for the sake of continuity we were asked by the ED provider to speak to this patient regarding goals of care. Note that she is on pressors and will be admitted to ICU level of care if they choose to continue full scope of care.  Had extensive discussion with daughter who is translating for the patient and daughter remains very resistant to anything other than full scope of care. Consistent with discussions during prior 2 admissions with our team, the patient defers to the daughter's wishes and she strongly wants to continue pursuing curative treatment. She appears to have fair insight into the severity of her mother's illness. The patient appears fairly comfortable and is interacting appropriately. The daughter expresses gratitude for the care that we have been providing.  Of note, she does not wish to see anyone from the palliative care team. She has seen them on prior admissions and declines consult from them at this time.  Edison Simon, MD, PGY-1 Internal Medicine Teaching Service

## 2020-10-27 NOTE — ED Triage Notes (Signed)
Pt bib ems with reports of sudden onset abd pain today at 1200. Pt with recent appendectomy. Pt HR 115, BP 57/39, RR 30, ETCo2 24. Given 800cc NS en route.

## 2020-10-27 NOTE — Sepsis Progress Note (Signed)
Elink monitoring this Code Sepsis. 

## 2020-10-27 NOTE — ED Provider Notes (Signed)
[facility] EMERGENCY DEPARTMENT Provider Note   CSN: [id number] Arrival date & time: 10/23/2020  1754     History Chief Complaint  Patient presents with   Abdominal Pain   Hypotension    Whitney [name] is a 85 y.o. female.  Patient with history of complicated ESBL UTIs, recent admission for appendicitis, failure to thrive history presents with worsening general weakness and acute abdominal pain that started earlier today. Unable to get details from patient, daughter arrived later to help provide some details. Patient felt generally unwell earlier today. No reported blood in the stools. Patient was told last admission not a surgical candidate.        Past Medical History:  Diagnosis Date   CKD (chronic kidney disease)    Complication of anesthesia    PER FAMILY PATIENT HAD A SEIZURE WHEN " THEY PUT A ROD IN HER LEG "   Diabetes mellitus without complication [facility])     Patient Active Problem List   Diagnosis Date Noted   Shock ([place]) 11/03/2020   Adult failure to thrive    Urinary tract infection due to ESBL Klebsiella 09/26/2020   Acute appendicitis 09/18/2020   S/P appendectomy 09/18/2020   Bacteremia due to Klebsiella pneumoniae 08/22/2020   Abdominal distention    Acute cystitis with hematuria    Hip pain    Leukocytosis    Pressure injury of skin 08/18/2020   Sepsis ([place]) 08/17/2020   Pyelitis 08/17/2020   Dehydration    Pain    Goals of care, counseling/discussion    DNR (do not resuscitate) discussion    Palliative care by specialist    Closed right hip fracture, initial encounter ([place]) 03/03/2020   Diabetes mellitus without complication ([place])    CKD (chronic kidney disease), stage III ([place])    Closed right hip fracture ([place])    Protein-calorie malnutrition, severe (H[facility])    Bilateral leg edema    Intractable pain 03/02/2020    Past Surgical History:  Procedure Laterality Date   APPENDECTOMY  09/18/2020    CYSTOSCOPY W/ URETERAL STENT PLACEMENT Left 08/18/2020   Procedure: CYSTOSCOPY WITH RETROGRADE PYELOGRAM/URETERAL STENT PLACEMENT;  Surgeon: [name], MD;  Location: [facility];  Service: Urology;  Laterality: Left;   IR FLUORO GUIDE CV LINE RIGHT  08/23/2020   IR REMOVAL TUN CV CATH W/O FL  08/31/2020   IR US GUIDE VASC ACCESS RIGHT  08/23/2020   LAPAROSCOPIC APPENDECTOMY N/A 09/18/2020   Procedure: APPENDECTOMY LAPAROSCOPIC;  Surgeon: Coralie Keens, MD;  Location: [facility];  Service: General;  Laterality: N/A;   TOTAL HIP ARTHROPLASTY Right 03/05/2020   Procedure: HIP GIRDLE STONE;  Surgeon: Leandrew Koyanagi, MD;  Location: [facility];  Service: Orthopedics;  Laterality: Right;     OB History   No obstetric history on file.     No family history on file.  Social History   Tobacco Use   Smoking status: Never Smoker   Smokeless tobacco: Never Used  Vaping Use   Vaping Use: Never used  Substance Use Topics   Alcohol use: Never   Drug use: Never    Home Medications Prior to Admission medications   Medication Sig Start Date End Date Taking? Authorizing Provider  acetaminophen (TYLENOL) 500 MG tablet Take 1,000 mg by mouth every 8 (eight) hours as needed for moderate pain or mild pain. 02/28/20  Yes [provider]  calcitonin, salmon, (MIACALCIN/FORTICAL) 200 UNIT/ACT nasal spray Place 1 spray into alternate nostrils daily.  03/30/20  Yes Vann, Jessica U, DO  furosemide (LASIX) 20 MG tablet Take 20 mg by mouth daily. 10/13/20  Yes [provider]  hydrocortisone cream 1 % Apply topically 2 (two) times daily. Patient taking differently: Apply 1 application topically 2 (two) times daily. 10/03/20  Yes Cato Mulligan, MD  ibuprofen (ADVIL) 200 MG tablet Take 200-400 mg by mouth daily as needed for headache (pain).   Yes [provider]  insulin glargine (LANTUS) 100 UNIT/ML Solostar Pen Inject 5 Units into the skin at bedtime. Patient taking  differently: Inject 6 Units into the skin at bedtime. 04/12/20  Yes Argentina Donovan, PA-C  Multiple Vitamin (MULTIVITAMIN WITH MINERALS) TABS tablet Take 1 tablet by mouth daily.   Yes [provider]  Nutritional Supplements (,FEEDING SUPPLEMENT, PROSOURCE PLUS) liquid Take 30 mLs by mouth 2 (two) times daily between meals. 10/03/20  Yes Cato Mulligan, MD  oxyCODONE-acetaminophen (PERCOCET) 5-325 MG tablet Take 1-2 tablets by mouth every 8 (eight) hours as needed for severe pain. Patient taking differently: Take 1 tablet by mouth every 8 (eight) hours as needed for severe pain. 03/05/20  Yes Leandrew Koyanagi, MD  Vitamin D, Ergocalciferol, (DRISDOL) 1.25 MG (50000 UNIT) CAPS capsule Take 1 capsule (50,000 Units total) by mouth every 7 (seven) days. Patient taking differently: Take 50,000 Units by mouth every Wednesday. 03/30/20  Yes Vann, Jessica U, DO  escitalopram (LEXAPRO) 10 MG tablet Take 10 mg by mouth daily. 10/20/20   [provider]  famotidine (PEPCID) 20 MG tablet Take 1 tablet (20 mg total) by mouth 2 (two) times daily. Patient not taking: No sig reported 03/30/20   Geradine Girt, DO  Insulin Pen Needle (PEN NEEDLES) 30G X 5 MM MISC Use as instructed. 04/26/20   Argentina Donovan, PA-C  tamsulosin (FLOMAX) 0.4 MG CAPS capsule Take 1 capsule (0.4 mg total) by mouth daily. Patient not taking: No sig reported 09/01/20   Virl Axe, MD    Allergies    Succinylcholine and Milk-related compounds  Review of Systems   Review of Systems  Unable to perform ROS: Other    Physical Exam Updated Vital Signs BP 93/60    Pulse (!) 135    Temp 98.3 F (36.8 C) (Rectal)    Resp (!) 27    SpO2 100%   Physical Exam Vitals and nursing note reviewed.  Constitutional:      Appearance: She is well-developed and well-nourished.  HENT:     Head: Normocephalic and atraumatic.     Comments: Dry mm Eyes:     General:        Right eye: No discharge.        Left eye: No  discharge.     Conjunctiva/sclera: Conjunctivae normal.  Neck:     Trachea: No tracheal deviation.  Cardiovascular:     Rate and Rhythm: Tachycardia present. Rhythm irregular.  Pulmonary:     Effort: Pulmonary effort is normal.     Breath sounds: Normal breath sounds.  Abdominal:     General: There is distension.     Palpations: Abdomen is soft.     Tenderness: There is generalized abdominal tenderness. There is guarding.  Musculoskeletal:        General: No edema.     Cervical back: Normal range of motion and neck supple.  Skin:    General: Skin is warm.     Findings: No rash.  Neurological:     Mental Status: She is  alert and oriented to person, place, and time.     Comments: General weakness Follows commands for daughter using translation   Psychiatric:        Mood and Affect: Mood and affect normal.     Comments: Uncomfortable, language barrier     ED Results / Procedures / Treatments   Labs (all labs ordered are listed, but only abnormal results are displayed) Labs Reviewed  LACTIC ACID, PLASMA - Abnormal; Notable for the following components:      Result Value   Lactic Acid, Venous 5.3 (*)    All other components within normal limits  LACTIC ACID, PLASMA - Abnormal; Notable for the following components:   Lactic Acid, Venous 8.0 (*)    All other components within normal limits  COMPREHENSIVE METABOLIC PANEL - Abnormal; Notable for the following components:   Glucose, Bld 161 (*)    BUN 42 (*)    Creatinine, Ser 2.04 (*)    Calcium 7.4 (*)    Total Protein 6.3 (*)    Albumin 1.2 (*)    GFR, Estimated 24 (*)    Anion gap 17 (*)    All other components within normal limits  CBC WITH DIFFERENTIAL/PLATELET - Abnormal; Notable for the following components:   RBC 3.64 (*)    Hemoglobin 10.4 (*)    HCT 35.4 (*)    MCHC 29.4 (*)    RDW 19.9 (*)    nRBC 0.4 (*)    Neutro Abs 8.7 (*)    Lymphs Abs 0.4 (*)    nRBC 1 (*)    All other components within normal  limits  PROTIME-INR - Abnormal; Notable for the following components:   Prothrombin Time 17.1 (*)    INR 1.5 (*)    All other components within normal limits  I-STAT CHEM 8, ED - Abnormal; Notable for the following components:   BUN 40 (*)    Creatinine, Ser 1.80 (*)    Glucose, Bld 157 (*)    Calcium, Ion 0.89 (*)    Hemoglobin 11.6 (*)    HCT 34.0 (*)    All other components within normal limits  CBG MONITORING, ED - Abnormal; Notable for the following components:   Glucose-Capillary 157 (*)    All other components within normal limits  RESP PANEL BY RT-PCR (FLU A&B, COVID) ARPGX2  CULTURE, BLOOD (SINGLE)  URINE CULTURE  APTT  LACTIC ACID, PLASMA  LACTIC ACID, PLASMA  LIPASE, BLOOD  URINALYSIS, COMPLETE (UACMP) WITH MICROSCOPIC  CBC  BASIC METABOLIC PANEL  PHOSPHORUS  MAGNESIUM  TYPE AND SCREEN    EKG EKG Interpretation  Date/Time:  Friday October 27 2020 17:55:36 EST Ventricular Rate:  112 PR Interval:    QRS Duration: 129 QT Interval:  392 QTC Calculation: 536 R Axis:   -100 Text Interpretation: Atrial fibrillation Ventricular premature complex RBBB and LAFB LVH by voltage Prolonged QT interval Confirmed by Elnora [facility] [phone]) on 10/28/2020 6:12:22 PM   Radiology CT ABDOMEN PELVIS WO CONTRAST  Result Date: 10/26/2020 CLINICAL DATA:  Abdominal pain and hypotension. Sudden onset of abdominal pain today. Appendectomy last month. EXAM: CT ABDOMEN AND PELVIS WITHOUT CONTRAST TECHNIQUE: Multidetector CT imaging of the abdomen and pelvis was performed following the standard protocol without IV contrast. COMPARISON:  Most recent CT 09/27/2020 FINDINGS: Lower chest: Small left pleural effusion improved from prior exam. Previous right pleural effusion is resolved. Chronic lung disease with interstitial coarsening. Mitral annulus calcifications. Decreased density of the blood pool consistent  with anemia. Hepatobiliary: No focal hepatic abnormality on noncontrast exam.  Suggestion of nodular hepatic contours. Calcified gallstones within distended gallbladder. Common bile duct is not well-defined, no evidence of biliary dilatation. No choledocholithiasis. Pancreas: Near completely fatty replaced. No ductal dilatation or inflammation. Spleen: Normal in size.  No focal abnormality. Adrenals/Urinary Tract: Normal adrenal glands. There is a left nephroureteral stent in place. There is air in the ureter adjacent to the stent is well as in the left renal collecting system. No definite renal parenchymal air. 2.8 cm cyst in the mid left kidney. Renal parenchymal thinning of both kidneys. No hydronephrosis. There is mild right perinephric edema. No evidence of renal calculi. Air-fluid level in the bladder which is otherwise decompressed. Stomach/Bowel: Bowel evaluation is limited in the absence of enteric contrast and presence of intra-abdominal ascites. No evidence of gastric wall thickening. No small bowel distension. Equivocal small bowel wall thickening involving bowel loops in the left abdomen versus reactive due to adjacent ascites, for example series 3, image 52. Majority of the colon is decompressed. Appendectomy. Vascular/Lymphatic: Aortic atherosclerosis. No aortic aneurysm. Infrarenal IVC filter in place. Left lateral strut abuts the aortic wall, unchanged from prior exam. Limited assessment for adenopathy given ascites. Reproductive: Uterine calcifications are likely vascular. Left adnexa obscured by streak artifact from left hip arthroplasty. No evidence of adnexal mass. Other: Moderate large volume of abdominopelvic ascites, some of the fluid is mildly complex. There may be some loculations in the right upper quadrant. No internal air or focal fluid collection. Generalized subcutaneous edema consistent with third spacing. Musculoskeletal: Chronic L1 compression fracture. Osteopenia/osteoporosis. Prior right hip Girdlestone procedure. Left hip arthroplasty. IMPRESSION: 1.  Moderate large volume of abdominopelvic ascites, some of the fluid is mildly complex. There may be some loculations in the right upper quadrant. No internal air or focal fluid collection. 2. Left nephroureteral stent in place. Air in the left ureter and left renal collecting system, likely related to instrumentation, possibility of emphysematous pyelitis is also considered. No renal parenchymal air or hydronephrosis. Air-fluid level in the bladder which is otherwise decompressed. 3. Equivocal small bowel wall thickening involving bowel loops in the left abdomen versus reactive due to adjacent ascites. 4. Suggestion of nodular hepatic contours, suspicious for cirrhosis. 5. Cholelithiasis. 6. Small left pleural effusion, improved from prior exam. Previous right pleural effusion is resolved. 7. Additional chronic findings as described. Aortic Atherosclerosis (ICD10-I70.0). Electronically Signed   By: Keith Rake M.D.   On: 10/26/2020 21:34   DG Chest Port 1 View  Result Date: 10/29/2020 CLINICAL DATA:  Concern for sepsis, evaluate for abnormality EXAM: PORTABLE CHEST 1 VIEW COMPARISON:  Radiograph 09/21/2020, CT abdomen pelvis 09/20/2020 for evaluation bases FINDINGS: There are bilateral coarse interstitial opacities within the mid to lower lungs, similar to comparison exams albeit with some increasingly coalescent opacity along right heart border and left lung base. Suspect at least small left pleural effusion as well. No pneumothorax. The aorta is calcified. The remaining cardiomediastinal contours are unremarkable. The osseous structures appear diffusely demineralized which may limit detection of small or nondisplaced fractures. Remote posttraumatic deformity of the proximal left humerus is similar to comparison as well. No acute osseous abnormalities. Mild edematous changes in the soft tissues. IMPRESSION: 1. Bilateral coarse interstitial opacities within the mid to lower lungs, similar to comparison  exams albeit with some increasingly coalescent opacity along the right heart border and left lung base, could reflect developing infection or edema on a background of chronic interstitial disease and  scarring. 2. Suspect at least small left pleural effusion. 3. Remote chronic deformity of the left proximal humerus. Electronically Signed   By: Lovena Le M.D.   On: 10/26/2020 19:53    Procedures .Critical Care Performed by: Elnora Morrison, MD Authorized by: Elnora Morrison, MD   Critical care provider statement:    Critical care time (minutes):  120   Critical care start time:  10/20/2020 7:00 PM   Critical care end time:  10/24/2020 9:00 PM   Critical care time was exclusive of:  Separately billable procedures and treating other patients and teaching time   Critical care was necessary to treat or prevent imminent or life-threatening deterioration of the following conditions:  Sepsis and shock   Critical care was time spent personally by me on the following activities:  Discussions with consultants, evaluation of patient's response to treatment, examination of patient, ordering and performing treatments and interventions, ordering and review of laboratory studies, ordering and review of radiographic studies, pulse oximetry, re-evaluation of patient's condition, obtaining history from patient or surrogate and review of old charts Ultrasound ED Peripheral IV (Provider)  Date/Time: 11/04/2020 11:15 PM Performed by: Elnora Morrison, MD Authorized by: Elnora Morrison, MD   Procedure details:    Indications: multiple failed IV attempts     Skin Prep: chlorhexidine gluconate     Location:  Right AC   Angiocath:  18 G   Bedside Ultrasound Guided: Yes     Images: archived     Patient tolerated procedure without complications: Yes     Dressing applied: Yes   Ultrasound ED Peripheral IV (Provider)  Date/Time: 10/30/2020 11:16 PM Performed by: Elnora Morrison, MD Authorized by: Elnora Morrison, MD    Procedure details:    Indications: multiple failed IV attempts and poor IV access     Skin Prep: chlorhexidine gluconate     Location:  Left AC   Angiocath:  18 G   Bedside Ultrasound Guided: No     Images: archived     Patient tolerated procedure without complications: Yes     Dressing applied: Yes   Ultrasound ED FAST  Date/Time: 10/19/2020 11:18 PM Performed by: Elnora Morrison, MD Authorized by: Elnora Morrison, MD  Procedure details:    Assess for:  Intra-abdominal fluid    Technique:  Abdominal    Images: archived    Study Limitations: body habitus  Abdominal findings:    R kidney:  Visualized   Liver:  Visualized    Hepatorenal space visualized: identified     Splenorenal space: identified     Splenorenal free fluid: identified     Hepatorenal space free fluid: identified   Ultrasound ED Echo  Date/Time: 11/01/2020 11:20 PM Performed by: Elnora Morrison, MD Authorized by: Elnora Morrison, MD   Procedure details:    Indications: hypotension     Views: parasternal long axis view and apical 4 chamber view   Findings:    Pericardium: no pericardial effusion   Impression:    Impression: normal     (including critical care time)  Medications Ordered in ED Medications  norepinephrine (LEVOPHED) 4mg  in 234mL premix infusion (20 mcg/min Intravenous Rate/Dose Change 10/18/2020 1925)  lactated ringers infusion (has no administration in time range)  lactated ringers bolus 1,000 mL (has no administration in time range)    And  lactated ringers bolus 1,000 mL (has no administration in time range)    And  lactated ringers bolus 1,000 mL (has no administration in time range)  fentaNYL (  SUBLIMAZE) injection 40 mcg (40 mcg Intravenous Given 11/01/2020 2011)  hydrocortisone sodium succinate (SOLU-CORTEF) 100 MG injection 50 mg (50 mg Intravenous Given 11/06/2020 2154)  lactated ringers bolus 1,000 mL (has no administration in time range)  docusate sodium (COLACE) capsule 100 mg  (has no administration in time range)  polyethylene glycol (MIRALAX / GLYCOLAX) packet 17 g (has no administration in time range)  insulin aspart (novoLOG) injection 0-9 Units (2 Units Subcutaneous Given 10/13/2020 2212)  lactated ringers bolus 1,000 mL (0 mLs Intravenous Stopped 10/26/2020 1926)  lactated ringers bolus 1,000 mL (1,000 mLs Intravenous New Bag/Given 11/01/2020 1904)  meropenem (MERREM) 500 mg in sodium chloride 0.9 % 100 mL IVPB (0 mg Intravenous Stopped 10/30/2020 2155)  calcium gluconate 1 g/ 50 mL sodium chloride IVPB (1,000 mg Intravenous New Bag/Given 10/28/2020 2153)  midazolam (VERSED) injection 2 mg (2 mg Intravenous Given 10/12/2020 2222)    ED Course  I have reviewed the triage vital signs and the nursing notes.  Pertinent labs & imaging results that were available during my care of the patient were reviewed by me and considered in my medical decision making (see chart for details).    MDM Rules/Calculators/A&P                          Patient presents with acute abdominal pain, general weakness and hypotension in the fifties systolic on arrival. Reviewed medical records and recent discharge summary with recent appendicitis, patient not surgical candidate, complicated UTI history. Bedside ultrasound showed ascites.  Patient's blood pressure persistently in the 50 systolic, IV fluid bolus ordered. Patient difficult IV ultrasound-guided by myself in the right AC to start IV fluids. Sepsis screening orders discussed with nursing staff. Patient's daughter arrived discussed concerns of significant pathology given age, medical history, recent infection and admission. Concern for septic shock versus hemorrhage versus other cause of hypotension. Initial lactate greater than 5. Discussed broad IV antibiotics with pharmacy. 30 cc/kg IV fluids ordered.   Patient's blood pressure did not respond to IV fluids, pressors started norepinephrine titrated. Blood pressure improved to 90  systolic with fluids and pressors. Multiple reassessments and updating patient's daughter on severity of condition, palliative care consult and discussed on the phone. Discussed with recent admission team as well who discussed with patient's daughter. Per patient's daughter, decision maker, patient is currently full code and would like continued IV antibiotics, fluids and pressors. Patient daughter agrees she is not a surgical candidate.   Discussed with critical care for assessment in the ER, continued management and admission. Once blood pressure improved we were able to send patient to CT scan without contrast showing significant ascites and inflammation.   Final Clinical Impression(s) / ED Diagnoses Final diagnoses:  Septic shock ([place])  Lactic acidosis  Sepsis with hypotension ([facility])  Hypocalcemia  Atrial fibrillation with RVR ([place])  Other ascites    Rx / DC Orders ED Discharge Orders    None       Elnora Morrison, MD 11/09/2020 2321

## 2020-10-27 NOTE — H&P (Signed)
NAMEAneesha [name], MRN:  [id number], DOB:  1935/05/25, LOS: 0 ADMISSION DATE:  10/13/2020, CONSULTATION DATE:  11/01/2020 REFERRING MD:  Reather Converse, CHIEF COMPLAINT:  Abdominal pain  Brief History   85yF with septic and hypovolemic shock with likely intraabdominal source  History of present illness   85yF with DM2, cirrhosis, CKD3, chronic R hip fracture, recent admission for emphysematous cystitis and pyelitis due to ESBL klebsiella, last discharged 11/23 from admission for acute appendicitis s/p appendectomy 09/18/20. Her course was complicated by recurrent abdominal pain, found to have imaging suggestive of acute cholecystitis although interpretation challengign in setting of her anasarca - she was not felt at the time to be candidate for c-tube (IR concerned at the time for long term infectious complication in setting of her ascites) or surgery due to frailty. When she was discharged her daughter says that she barely ate or drank anything. This evening she developed sudden onset sharp lower abdominal pain prompting their visit to the ED. She's had no nausea, vomiting, diarrhea, hematochezia/melena, fever.  In ED she was given 2L IVF, meropenem, started on levophed through a peripheral IV  Past Medical History  DM2 Cirrhosis [place] [facility] Events   10/26/2020 admitted  Consults:  Noine  Procedures:  10/11/2020: central line, arterial line, diagnostic paracentesis  Significant Diagnostic Tests:  CT A/P 12/17 with rapid accumulation of complex appearing ascites, emphysematous pyelo which may have acute component, bowel wall thickening, cholelithiasis  Micro Data:  12/17 BCx in process  Antimicrobials:  12/17 meropenem-  Interim history/subjective:  N/a  Objective   Blood pressure 92/61, pulse (!) 135, temperature 98.3 F (36.8 C), temperature source Rectal, resp. rate (!) 25, SpO2 100 %.       No intake or output data in the 24 hours ending 11/05/2020 2055 There were  no vitals filed for this visit.  Examination: General: alert/oriented x3, groaning HENT: NCAT, dry MM Lungs: rhonchorous, mildly increased normal work of breathing Cardiovascular: tachycardic, no murmur, no JVD  Abdomen: distended but soft, quiet Extremities: warm, well-perfused without cyanosis, 2+ edema Neuro: grossly nonfocal, follows commands  Resolved Hospital Problem list   n/a  Assessment & Plan:   # shock: favor septic with intraabdominal source (?secondary peritonitis) vs GU source and hypovolemic.  - follow cultures and narrow ABX as able - empiric meropenem based on previous culture data - wean levo for MAP >65 - stress dose steroids - follow response to another liter of fluid, may need addition of vaso once central line placed  # Ascites: suspect secondary peritonitis.  - ABX as above - await cell count/diff, cultures, may need involvement of surgical team in AM if truly appears consistent with secondary peritonitis depending on course tonight and goals of care although her frailty likely limits her candidacy for operative intervention - in setting recent questionable cholecystitis, ordered bilirubin - in setting pyelo/stent ordered body fluid creatinine  # AKI on CKD: - f/u response to resuscitative measures above  # Lactic acidosis: - trend   # Goals of care: last admission palliative followed  - may need involvement of palliative care in AM    Best practice:  Diet: npo for with sips for now Pain/Anxiety/Delirium protocol (if indicated): no VAP protocol (if indicated): no DVT prophylaxis: hold till procedures this evening completed GI prophylaxis: not indicated Glucose control: SSI Mobility: bed level Code Status: Full Family Communication: updated with daughter at bedside.  Disposition: MICU  Labs   CBC: Recent Labs  Lab  11/02/2020 1814 10/31/2020 1846  WBC 9.4  --   NEUTROABS 8.7*  --   HGB 10.4* 11.6*  HCT 35.4* 34.0*  MCV 97.3  --   PLT 250   --     Basic Metabolic Panel: Recent Labs  Lab 11/02/2020 1814 10/29/2020 1846  NA 143 142  K 3.5 3.6  CL 103 104  CO2 23  --   GLUCOSE 161* 157*  BUN 42* 40*  CREATININE 2.04* 1.80*  CALCIUM 7.4*  --    GFR: CrCl cannot be calculated (Unknown ideal weight.). Recent Labs  Lab 10/28/2020 1814 10/12/2020 1900  WBC 9.4  --   LATICACIDVEN  --  5.3*    Liver Function Tests: Recent Labs  Lab 10/23/2020 1814  AST 28  ALT 12  ALKPHOS 88  BILITOT 1.1  PROT 6.3*  ALBUMIN 1.2*   No results for input(s): LIPASE, AMYLASE in the last 168 hours. No results for input(s): AMMONIA in the last 168 hours.  ABG    Component Value Date/Time   TCO2 23 10/13/2020 1846     Coagulation Profile: Recent Labs  Lab 10/19/2020 1814  INR 1.5*    Cardiac Enzymes: No results for input(s): CKTOTAL, CKMB, CKMBINDEX, TROPONINI in the last 168 hours.  HbA1C: Hgb A1c MFr Bld  Date/Time Value Ref Range Status  08/18/2020 01:35 AM 7.8 (H) 4.8 - 5.6 % Final    Comment:    (NOTE) Pre diabetes:          5.7%-6.4%  Diabetes:              >6.4%  Glycemic control for   <7.0% adults with diabetes     CBG: No results for input(s): GLUCAP in the last 168 hours.  Review of Systems:   A twelve point review of systems is negative except as otherwise noted in HPI  Past Medical History  She,  has a past medical history of CKD (chronic kidney disease), Complication of anesthesia, and Diabetes mellitus without complication ([place]).   Surgical History    Past Surgical History:  Procedure Laterality Date  . APPENDECTOMY  09/18/2020  . CYSTOSCOPY W/ URETERAL STENT PLACEMENT Left 08/18/2020   Procedure: CYSTOSCOPY WITH RETROGRADE PYELOGRAM/URETERAL STENT PLACEMENT;  Surgeon: [name], MD;  Location: [place];  Service: Urology;  Laterality: Left;  . IR FLUORO GUIDE CV LINE RIGHT  08/23/2020  . IR REMOVAL TUN CV CATH W/O FL  08/31/2020  . IR US GUIDE VASC ACCESS RIGHT  08/23/2020  . LAPAROSCOPIC  APPENDECTOMY N/A 09/18/2020   Procedure: APPENDECTOMY LAPAROSCOPIC;  Surgeon: Coralie Keens, MD;  Location: [place];  Service: General;  Laterality: N/A;  . TOTAL HIP ARTHROPLASTY Right 03/05/2020   Procedure: HIP GIRDLE STONE;  Surgeon: Leandrew Koyanagi, MD;  Location: [place];  Service: Orthopedics;  Laterality: Right;     Social History   reports that she has never smoked. She has never used smokeless tobacco. She reports that she does not drink alcohol and does not use drugs.   Family History   Her family history is not on file.   Allergies Allergies  Allergen Reactions  . Succinylcholine Other (See Comments)    No formal diagnosis of pseudocholinesterase deficiency; however, prolonged paralysis during anesthetics on 09/18/20 and 08/18/20.      Home Medications  Prior to Admission medications   Medication Sig Start Date End Date Taking? Authorizing Provider  acetaminophen (TYLENOL) 500 MG tablet Take 2 tablets by mouth every 8 (  eight) hours as needed for moderate pain. 02/28/20   [provider]  calcitonin, salmon, (MIACALCIN/FORTICAL) 200 UNIT/ACT nasal spray Place 1 spray into alternate nostrils daily. 03/30/20   Geradine Girt, DO  famotidine (PEPCID) 20 MG tablet Take 1 tablet (20 mg total) by mouth 2 (two) times daily. 03/30/20   Geradine Girt, DO  hydrocortisone cream 1 % Apply topically 2 (two) times daily. 10/03/20   Cato Mulligan, MD  ibuprofen (ADVIL) 200 MG tablet Take 200-400 mg by mouth daily as needed for headache (pain).    [provider]  insulin glargine (LANTUS) 100 UNIT/ML Solostar Pen Inject 5 Units into the skin at bedtime. 04/12/20   Argentina Donovan, PA-C  Insulin Pen Needle (PEN NEEDLES) 30G X 5 MM MISC Use as instructed. 04/26/20   Argentina Donovan, PA-C  Multiple Vitamin (MULTIVITAMIN WITH MINERALS) TABS tablet Take 1 tablet by mouth daily.    [provider]  Nutritional Supplements (,FEEDING SUPPLEMENT, PROSOURCE PLUS) liquid Take  30 mLs by mouth 2 (two) times daily between meals. 10/03/20   Cato Mulligan, MD  nystatin (MYCOSTATIN) 100000 UNIT/ML suspension Take 5 mLs (500,000 Units total) by mouth 4 (four) times daily. 10/03/20   Cato Mulligan, MD  oxyCODONE-acetaminophen (PERCOCET) 5-325 MG tablet Take 1-2 tablets by mouth every 8 (eight) hours as needed for severe pain. 03/05/20   Leandrew Koyanagi, MD  tamsulosin (FLOMAX) 0.4 MG CAPS capsule Take 1 capsule (0.4 mg total) by mouth daily. 09/01/20   Virl Axe, MD  Vitamin D, Ergocalciferol, (DRISDOL) 1.25 MG (50000 UNIT) CAPS capsule Take 1 capsule (50,000 Units total) by mouth every 7 (seven) days. 03/30/20   Geradine Girt, DO     Critical care time: 30    This patient is critically ill with septic shock; which, requires frequent high complexity decision making, assessment, support, evaluation, and titration of therapies. This was completed through the application of advanced monitoring technologies and extensive interpretation of multiple databases. During this encounter critical care time was devoted to patient care services described in this note for 30 minutes.  Letta Median, Pulmonary/Critical Care

## 2020-10-27 NOTE — ED Notes (Signed)
RN messaged main pharmacy for missing merrem med

## 2020-10-27 NOTE — ED Notes (Signed)
RN called CT for pt to be taken for imaging

## 2020-10-28 ENCOUNTER — Other Ambulatory Visit: Payer: Self-pay

## 2020-10-28 DIAGNOSIS — R188 Other ascites: Secondary | ICD-10-CM | POA: Diagnosis present

## 2020-10-28 DIAGNOSIS — R579 Shock, unspecified: Secondary | ICD-10-CM

## 2020-10-28 LAB — BASIC METABOLIC PANEL
Anion gap: 14 (ref 5–15)
Anion gap: 10 (ref 5–15)
BUN: 39 mg/dL — ABNORMAL HIGH (ref 8–23)
BUN: 37 mg/dL — ABNORMAL HIGH (ref 8–23)
CO2: 20 mmol/L — ABNORMAL LOW (ref 22–32)
CO2: 25 mmol/L (ref 22–32)
Calcium: 7.4 mg/dL — ABNORMAL LOW (ref 8.9–10.3)
Calcium: 7.5 mg/dL — ABNORMAL LOW (ref 8.9–10.3)
Chloride: 104 mmol/L (ref 98–111)
Chloride: 105 mmol/L (ref 98–111)
Creatinine, Ser: 2 mg/dL — ABNORMAL HIGH (ref 0.44–1.00)
Creatinine, Ser: 1.92 mg/dL — ABNORMAL HIGH (ref 0.44–1.00)
GFR, Estimated: 24 mL/min — ABNORMAL LOW (ref >60)
GFR, Estimated: 26 mL/min — ABNORMAL LOW (ref >60)
Glucose, Bld: 215 mg/dL — ABNORMAL HIGH (ref 70–99)
Glucose, Bld: 171 mg/dL — ABNORMAL HIGH (ref 70–99)
Potassium: 3.3 mmol/L — ABNORMAL LOW (ref 3.5–5.1)
Potassium: 3.3 mmol/L — ABNORMAL LOW (ref 3.5–5.1)
Sodium: 138 mmol/L (ref 135–145)
Sodium: 140 mmol/L (ref 135–145)

## 2020-10-28 LAB — BLOOD CULTURE ID PANEL (REFLEXED) - BCID2

## 2020-10-28 LAB — LIPASE, BLOOD: Lipase: 10 U/L — ABNORMAL LOW (ref 11–51)

## 2020-10-28 LAB — GLUCOSE, CAPILLARY
Glucose-Capillary: 120 mg/dL — ABNORMAL HIGH (ref 70–99)
Glucose-Capillary: 139 mg/dL — ABNORMAL HIGH (ref 70–99)
Glucose-Capillary: 147 mg/dL — ABNORMAL HIGH (ref 70–99)
Glucose-Capillary: 156 mg/dL — ABNORMAL HIGH (ref 70–99)
Glucose-Capillary: 166 mg/dL — ABNORMAL HIGH (ref 70–99)
Glucose-Capillary: 168 mg/dL — ABNORMAL HIGH (ref 70–99)
Glucose-Capillary: 183 mg/dL — ABNORMAL HIGH (ref 70–99)

## 2020-10-28 LAB — URINALYSIS, COMPLETE (UACMP) WITH MICROSCOPIC
Bilirubin Urine: NEGATIVE
Glucose, UA: NEGATIVE mg/dL
Ketones, ur: NEGATIVE mg/dL
Nitrite: NEGATIVE
Protein, ur: 100 mg/dL — AB
Specific Gravity, Urine: 1.013 (ref 1.005–1.030)
pH: 5 (ref 5.0–8.0)

## 2020-10-28 LAB — BODY FLUID CELL COUNT WITH DIFFERENTIAL
Lymphs, Fluid: 1 %
Monocyte-Macrophage-Serous Fluid: 9 % — ABNORMAL LOW (ref 50–90)
Neutrophil Count, Fluid: 90 % — ABNORMAL HIGH (ref 0–25)
Total Nucleated Cell Count, Fluid: 8820 cu mm — ABNORMAL HIGH (ref 0–1000)

## 2020-10-28 LAB — LACTATE DEHYDROGENASE, PLEURAL OR PERITONEAL FLUID: LD, Fluid: 84 U/L — ABNORMAL HIGH (ref 3–23)

## 2020-10-28 LAB — AMYLASE, PLEURAL OR PERITONEAL FLUID: Amylase, Fluid: 7 U/L

## 2020-10-28 LAB — LACTIC ACID, PLASMA
Lactic Acid, Venous: 6.4 mmol/L (ref 0.5–1.9)
Lactic Acid, Venous: 3.7 mmol/L (ref 0.5–1.9)
Lactic Acid, Venous: 1.3 mmol/L (ref 0.5–1.9)

## 2020-10-28 LAB — CREATININE, FLUID (PLEURAL, PERITONEAL, JP DRAINAGE): Creat, Fluid: 2 mg/dL

## 2020-10-28 LAB — CBC
HCT: 32.2 % — ABNORMAL LOW (ref 36.0–46.0)
Hemoglobin: 9.7 g/dL — ABNORMAL LOW (ref 12.0–15.0)
MCH: 28.4 pg (ref 26.0–34.0)
MCHC: 30.1 g/dL (ref 30.0–36.0)
MCV: 94.2 fL (ref 80.0–100.0)
Platelets: UNDETERMINED 10*3/uL (ref 150–400)
RBC: 3.42 MIL/uL — ABNORMAL LOW (ref 3.87–5.11)
RDW: 19.9 % — ABNORMAL HIGH (ref 11.5–15.5)
WBC: 18.3 10*3/uL — ABNORMAL HIGH (ref 4.0–10.5)
nRBC: 0.3 % — ABNORMAL HIGH (ref 0.0–0.2)

## 2020-10-28 LAB — ALBUMIN, PLEURAL OR PERITONEAL FLUID: Albumin, Fluid: <1 g/dL

## 2020-10-28 LAB — GLUCOSE, PLEURAL OR PERITONEAL FLUID: Glucose, Fluid: 129 mg/dL

## 2020-10-28 LAB — PHOSPHORUS: Phosphorus: 4.2 mg/dL (ref 2.5–4.6)

## 2020-10-28 LAB — MAGNESIUM: Magnesium: 1.5 mg/dL — ABNORMAL LOW (ref 1.7–2.4)

## 2020-10-28 LAB — MRSA PCR SCREENING: MRSA by PCR: NEGATIVE

## 2020-10-28 LAB — PROTEIN, PLEURAL OR PERITONEAL FLUID: Total protein, fluid: <3 g/dL

## 2020-10-28 MED ORDER — SODIUM CHLORIDE 0.9 % IV SOLN
1.0000 g | Freq: Two times a day (BID) | INTRAVENOUS | Status: DC
  Administered 2020-10-28 – 2020-11-02 (×10): 1 g via INTRAVENOUS
  Filled 2020-10-28 (×11): qty 1

## 2020-10-28 MED ORDER — SODIUM CHLORIDE 0.9% FLUSH: 10.0000 mL | INTRAVENOUS | Status: DC | PRN

## 2020-10-28 MED ORDER — ORAL CARE MOUTH RINSE
15.0000 mL | Freq: Two times a day (BID) | OROMUCOSAL | Status: DC
  Administered 2020-10-28 – 2020-11-15 (×28): 15 mL via OROMUCOSAL

## 2020-10-28 MED ORDER — NOREPINEPHRINE 16 MG/250ML-% IV SOLN
0.0000 ug/min | INTRAVENOUS | Status: DC
  Administered 2020-10-28: 20:00:00 6 ug/min via INTRAVENOUS
  Administered 2020-10-28: 01:00:00 20 ug/min via INTRAVENOUS
  Administered 2020-10-31: 05:00:00 5 ug/min via INTRAVENOUS
  Administered 2020-11-02: 10:00:00 2 ug/min via INTRAVENOUS
  Filled 2020-10-28 (×4): qty 250

## 2020-10-28 MED ORDER — SODIUM CHLORIDE 0.9 % IV SOLN
200.0000 mg | INTRAVENOUS | Status: DC
  Filled 2020-10-28: qty 200

## 2020-10-28 MED ORDER — MAGNESIUM SULFATE IN D5W 1-5 GM/100ML-% IV SOLN
1.0000 g | Freq: Once | INTRAVENOUS | Status: AC
  Administered 2020-10-28: 05:00:00 1 g via INTRAVENOUS
  Filled 2020-10-28: qty 100

## 2020-10-28 MED ORDER — SODIUM CHLORIDE 0.9% FLUSH
10.0000 mL | Freq: Two times a day (BID) | INTRAVENOUS | Status: DC
  Administered 2020-10-28 – 2020-11-02 (×13): 10 mL
  Administered 2020-11-03: 23:00:00 20 mL
  Administered 2020-11-04 – 2020-11-15 (×16): 10 mL

## 2020-10-28 MED ORDER — VANCOMYCIN HCL 1500 MG/300ML IV SOLN
1500.0000 mg | Freq: Once | INTRAVENOUS | Status: AC
  Administered 2020-10-28: 22:00:00 1500 mg via INTRAVENOUS
  Filled 2020-10-28: qty 300

## 2020-10-28 MED ORDER — SODIUM CHLORIDE 0.9 % IV SOLN: 100.0000 mg | INTRAVENOUS | Status: DC

## 2020-10-28 MED ORDER — SODIUM CHLORIDE 0.9 % IV SOLN
1.0000 g | Freq: Once | INTRAVENOUS | Status: AC
  Administered 2020-10-28: 04:00:00 1 g via INTRAVENOUS
  Filled 2020-10-28: qty 1

## 2020-10-28 MED ORDER — POTASSIUM CHLORIDE 10 MEQ/50ML IV SOLN
10.0000 meq | INTRAVENOUS | Status: AC
Start: 2020-10-28 — End: 2020-10-29
  Administered 2020-10-28 – 2020-10-29 (×6): 10 meq via INTRAVENOUS
  Filled 2020-10-28 (×5): qty 50

## 2020-10-28 MED ORDER — CHLORHEXIDINE GLUCONATE CLOTH 2 % EX PADS
6.0000 | MEDICATED_PAD | Freq: Every day | CUTANEOUS | Status: DC
  Administered 2020-10-28 – 2020-11-15 (×18): 6 via TOPICAL

## 2020-10-28 MED ORDER — VANCOMYCIN VARIABLE DOSE PER UNSTABLE RENAL FUNCTION (PHARMACIST DOSING): Status: DC

## 2020-10-28 MED ORDER — MAGNESIUM SULFATE 2 GM/50ML IV SOLN
2.0000 g | Freq: Once | INTRAVENOUS | Status: AC
  Administered 2020-10-28: 13:00:00 2 g via INTRAVENOUS
  Filled 2020-10-28: qty 50

## 2020-10-28 NOTE — Consult Note (Signed)
Urology Consult   Physician requesting consult: Dr Lamonte Sakai  Reason for consult: H/O kidney infections  History of Present Illness: Whitney [name] is a 85 y.o. female admitted for sepsis and mgmt of ascites. She has a complicated hx well-outlined in Dr Agustina Caroli notes. She was initially seen by Dr Matilde Sprang from [place] in April 2021 for catheter placement. Then in October of this year Dr Windell Norfolk was consulted for mgmt of emphysematous cystitis/pyelitis. A left sided stent was placed which has yet to be removed (pt apparently missed appt 12.2.2021).  11.8.2021--lap appy. She was readmitted 11.10.2021 for mgmt of an ESBL K. Pneumonia UTI.--Rxed w/ meropenem.     Currently admitted w/ multiple c/o's, abdominal pain. Recent CT revealed significant ascites. Cr of ascites c/w serum.  CT--air in bladder (has foley) as well as in Lt ureter. No gas in bladder wall or reneal parenchyma. Lt J2 stent positioned well. Bilateral renal atrophy, stable 2.5 mm Lt renal cyst. Small amt of fluid around Rt kidney. Massive ascites.  U/A--few bacteria, WBC,RBC. Nitrate neg.   Past Medical History:  Diagnosis Date  . CKD (chronic kidney disease)   . Complication of anesthesia    PER FAMILY PATIENT HAD A SEIZURE WHEN " THEY PUT A ROD IN HER LEG "  . Diabetes mellitus without complication [facility])     Past Surgical History:  Procedure Laterality Date  . APPENDECTOMY  09/18/2020  . CYSTOSCOPY W/ URETERAL STENT PLACEMENT Left 08/18/2020   Procedure: CYSTOSCOPY WITH RETROGRADE PYELOGRAM/URETERAL STENT PLACEMENT;  Surgeon: [name], MD;  Location: [place];  Service: Urology;  Laterality: Left;  . IR FLUORO GUIDE CV LINE RIGHT  08/23/2020  . IR REMOVAL TUN CV CATH W/O FL  08/31/2020  . IR US GUIDE VASC ACCESS RIGHT  08/23/2020  . LAPAROSCOPIC APPENDECTOMY N/A 09/18/2020   Procedure: APPENDECTOMY LAPAROSCOPIC;  Surgeon: Coralie Keens, MD;  Location: [place];  Service: General;  Laterality: N/A;  . TOTAL HIP  ARTHROPLASTY Right 03/05/2020   Procedure: HIP GIRDLE STONE;  Surgeon: Leandrew Koyanagi, MD;  Location: [place];  Service: Orthopedics;  Laterality: Right;     Current Hospital Medications: Scheduled Meds: . Chlorhexidine Gluconate Cloth  6 each Topical Daily  . hydrocortisone sod succinate (SOLU-CORTEF) inj  50 mg Intravenous Q6H  . insulin aspart  0-9 Units Subcutaneous Q4H  . mouth rinse  15 mL Mouth Rinse BID  . sodium chloride flush  10-40 mL Intracatheter Q12H   Continuous Infusions: . sodium chloride    . lactated ringers    . lactated ringers 150 mL/hr at 10/28/20 1200  . magnesium sulfate bolus IVPB 2 g (10/28/20 1245)  . meropenem (MERREM) IV    . norepinephrine (LEVOPHED) Adult infusion 12 mcg/min (10/28/20 1200)   PRN Meds:.Place/Maintain arterial line **AND** sodium chloride, docusate sodium, fentaNYL (SUBLIMAZE) injection, polyethylene glycol, sodium chloride flush  Allergies:  Allergies  Allergen Reactions  . Succinylcholine Other (See Comments)    No formal diagnosis of pseudocholinesterase deficiency; however, prolonged paralysis during anesthetics on 09/18/20 and 08/18/20.   . Milk-Related Compounds Diarrhea    No family history on file.  Social History:  reports that she has never smoked. She has never used smokeless tobacco. She reports that she does not drink alcohol and does not use drugs.  ROS: A complete review of systems was performed.  All systems are negative except for pertinent findings as noted.  Physical Exam:  Vital signs in last 24 hours: Temp:  [97 F (  36.1 C)-98.7 F (37.1 C)] 98.7 F (37.1 C) (12/18 1218) Pulse Rate:  [54-135] 105 (12/18 1215) Resp:  [18-38] 20 (12/18 1215) BP: (51-149)/(22-88) 111/64 (12/18 1215) SpO2:  [84 %-100 %] 96 % (12/18 1215) Weight:  [73 kg] 73 kg (12/18 0319)  Exam not performed.  Laboratory Data:  Recent Labs    10/19/2020 1814 10/23/2020 1846 10/28/20 0047  WBC 9.4  --  18.3*  HGB 10.4* 11.6* 9.7*  HCT  35.4* 34.0* 32.2*  PLT 250  --  PLATELET CLUMPS NOTED ON SMEAR, UNABLE TO ESTIMATE    Recent Labs    10/20/2020 1814 11/04/2020 1846 10/28/20 0047  NA 143 142 138  K 3.5 3.6 3.3*  CL 103 104 104  GLUCOSE 161* 157* 215*  BUN 42* 40* 39*  CALCIUM 7.4*  --  7.4*  CREATININE 2.04* 1.80* 2.00*     Results for orders placed or performed during the hospital encounter of 11/05/2020 (from the past 24 hour(s))  Comprehensive metabolic panel     Status: Abnormal   Collection Time: 11/06/2020  6:14 PM  Result Value Ref Range   Sodium 143 135 - 145 mmol/L   Potassium 3.5 3.5 - 5.1 mmol/L   Chloride 103 98 - 111 mmol/L   CO2 23 22 - 32 mmol/L   Glucose, Bld 161 (H) 70 - 99 mg/dL   BUN 42 (H) 8 - 23 mg/dL   Creatinine, Ser 2.04 (H) 0.44 - 1.00 mg/dL   Calcium 7.4 (L) 8.9 - 10.3 mg/dL   Total Protein 6.3 (L) 6.5 - 8.1 g/dL   Albumin 1.2 (L) 3.5 - 5.0 g/dL   AST 28 15 - 41 U/L   ALT 12 0 - 44 U/L   Alkaline Phosphatase 88 38 - 126 U/L   Total Bilirubin 1.1 0.3 - 1.2 mg/dL   GFR, Estimated 24 (L) >60 mL/min   Anion gap 17 (H) 5 - 15  CBC WITH DIFFERENTIAL     Status: Abnormal   Collection Time: 10/29/2020  6:14 PM  Result Value Ref Range   WBC 9.4 4.0 - 10.5 K/uL   RBC 3.64 (L) 3.87 - 5.11 MIL/uL   Hemoglobin 10.4 (L) 12.0 - 15.0 g/dL   HCT 35.4 (L) 36.0 - 46.0 %   MCV 97.3 80.0 - 100.0 fL   MCH 28.6 26.0 - 34.0 pg   MCHC 29.4 (L) 30.0 - 36.0 g/dL   RDW 19.9 (H) 11.5 - 15.5 %   Platelets 250 150 - 400 K/uL   nRBC 0.4 (H) 0.0 - 0.2 %   Neutrophils Relative % 93 %   Neutro Abs 8.7 (H) 1.7 - 7.7 K/uL   Lymphocytes Relative 4 %   Lymphs Abs 0.4 (L) 0.7 - 4.0 K/uL   Monocytes Relative 3 %   Monocytes Absolute 0.3 0.1 - 1.0 K/uL   Eosinophils Relative 0 %   Eosinophils Absolute 0.0 0.0 - 0.5 K/uL   Basophils Relative 0 %   Basophils Absolute 0.0 0.0 - 0.1 K/uL   WBC Morphology See Note    nRBC 1 (H) 0 /100 WBC   Abs Immature Granulocytes 0.00 0.00 - 0.07 K/uL  Protime-INR     Status:  Abnormal   Collection Time: 10/14/2020  6:14 PM  Result Value Ref Range   Prothrombin Time 17.1 (H) 11.4 - 15.2 seconds   INR 1.5 (H) 0.8 - 1.2  APTT     Status: None   Collection Time: 10/12/2020  6:14 PM  Result Value Ref Range   aPTT 32 24 - 36 seconds  Resp Panel by RT-PCR (Flu A&B, Covid) Nasopharyngeal Swab     Status: None   Collection Time: 11/04/2020  6:15 PM   Specimen: Nasopharyngeal Swab; Nasopharyngeal(NP) swabs in vial transport medium  Result Value Ref Range   SARS Coronavirus 2 by RT PCR NEGATIVE NEGATIVE   Influenza A by PCR NEGATIVE NEGATIVE   Influenza B by PCR NEGATIVE NEGATIVE  I-stat chem 8, ED (not at [facility] or [facility])     Status: Abnormal   Collection Time: 10/22/2020  6:46 PM  Result Value Ref Range   Sodium 142 135 - 145 mmol/L   Potassium 3.6 3.5 - 5.1 mmol/L   Chloride 104 98 - 111 mmol/L   BUN 40 (H) 8 - 23 mg/dL   Creatinine, Ser 1.80 (H) 0.44 - 1.00 mg/dL   Glucose, Bld 157 (H) 70 - 99 mg/dL   Calcium, Ion 0.89 (LL) 1.15 - 1.40 mmol/L   TCO2 23 22 - 32 mmol/L   Hemoglobin 11.6 (L) 12.0 - 15.0 g/dL   HCT 34.0 (L) 36.0 - 46.0 %   Comment NOTIFIED PHYSICIAN   Type and screen [place]     Status: None   Collection Time: 10/23/2020  6:47 PM  Result Value Ref Range   ABO/RH(D) A POS    Antibody Screen NEG    Sample Expiration      10/30/2020,2359 Performed at [facility], [address], [place], [place] [address]   Lactic acid, plasma     Status: Abnormal   Collection Time: 10/13/2020  7:00 PM  Result Value Ref Range   Lactic Acid, Venous 5.3 (HH) 0.5 - 1.9 mmol/L  Lactic acid, plasma     Status: Abnormal   Collection Time: 11/10/2020  9:50 PM  Result Value Ref Range   Lactic Acid, Venous 8.0 (HH) 0.5 - 1.9 mmol/L  CBG monitoring, ED     Status: Abnormal   Collection Time: 10/16/2020 10:03 PM  Result Value Ref Range   Glucose-Capillary 157 (H) 70 - 99 mg/dL  Body fluid cell count with differential     Status: Abnormal   Collection  Time: 10/14/2020 11:08 PM  Result Value Ref Range   Fluid Type-FCT PERITONEAL CYTO    Color, Fluid STRAW (A) YELLOW   Appearance, Fluid TURBID (A) CLEAR   Total Nucleated Cell Count, Fluid 8,820 (H) 0 - 1,000 cu mm   Neutrophil Count, Fluid 90 (H) 0 - 25 %   Lymphs, Fluid 1 %   Monocyte-Macrophage-Serous Fluid 9 (L) 50 - 90 %  Albumin, pleural or peritoneal fluid     Status: None   Collection Time: 11/09/2020 11:08 PM  Result Value Ref Range   Albumin, Fluid <1.0 g/dL   Fluid Type-FALB PERITONEAL CYTO   Protein, pleural or peritoneal fluid     Status: None   Collection Time: 11/07/2020 11:08 PM  Result Value Ref Range   Total protein, fluid <3.0 g/dL   Fluid Type-FTP PERITONEAL CYTO   Creatinine, fluid (pleural, peritoneal, JP Drainage)     Status: None   Collection Time: 11/01/2020 11:08 PM  Result Value Ref Range   Creat, Fluid 2.0 mg/dL   Fluid Type-FCRE PERITONEAL CYTO   Lactate dehydrogenase (pleural or peritoneal fluid)     Status: Abnormal   Collection Time: 10/12/2020 11:08 PM  Result Value Ref Range   LD, Fluid 84 (H) 3 - 23 U/L  Fluid Type-FLDH Peritoneal   Glucose, pleural or peritoneal fluid     Status: None   Collection Time: 10/31/2020 11:08 PM  Result Value Ref Range   Glucose, Fluid 129 mg/dL   Fluid Type-FGLU Peritoneal   Amylase, pleural or peritoneal fluid     Status: None   Collection Time: 10/25/2020 11:38 PM  Result Value Ref Range   Amylase, Fluid 7 U/L   Fluid Type-FAMY PERITONEAL CYTO   Body fluid culture (includes gram stain)     Status: None (Preliminary result)   Collection Time: 11/05/2020 11:38 PM   Specimen: Peritoneal Washings; Peritoneal Fluid  Result Value Ref Range   Specimen Description PERITONEAL    Special Requests NONE    Gram Stain      ABUNDANT WBC PRESENT, PREDOMINANTLY PMN NO ORGANISMS SEEN Performed at [facility], [address] [place],  [address]    Culture PENDING    Report Status PENDING   Glucose, capillary      Status: Abnormal   Collection Time: 10/28/20 12:40 AM  Result Value Ref Range   Glucose-Capillary 183 (H) 70 - 99 mg/dL  Lactic acid, plasma     Status: Abnormal   Collection Time: 10/28/20 12:47 AM  Result Value Ref Range   Lactic Acid, Venous 6.4 (HH) 0.5 - 1.9 mmol/L  Lipase, blood     Status: Abnormal   Collection Time: 10/28/20 12:47 AM  Result Value Ref Range   Lipase 10 (L) 11 - 51 U/L  CBC     Status: Abnormal   Collection Time: 10/28/20 12:47 AM  Result Value Ref Range   WBC 18.3 (H) 4.0 - 10.5 K/uL   RBC 3.42 (L) 3.87 - 5.11 MIL/uL   Hemoglobin 9.7 (L) 12.0 - 15.0 g/dL   HCT 32.2 (L) 36.0 - 46.0 %   MCV 94.2 80.0 - 100.0 fL   MCH 28.4 26.0 - 34.0 pg   MCHC 30.1 30.0 - 36.0 g/dL   RDW 19.9 (H) 11.5 - 15.5 %   Platelets PLATELET CLUMPS NOTED ON SMEAR, UNABLE TO ESTIMATE 150 - 400 K/uL   nRBC 0.3 (H) 0.0 - 0.2 %  Basic metabolic panel     Status: Abnormal   Collection Time: 10/28/20 12:47 AM  Result Value Ref Range   Sodium 138 135 - 145 mmol/L   Potassium 3.3 (L) 3.5 - 5.1 mmol/L   Chloride 104 98 - 111 mmol/L   CO2 20 (L) 22 - 32 mmol/L   Glucose, Bld 215 (H) 70 - 99 mg/dL   BUN 39 (H) 8 - 23 mg/dL   Creatinine, Ser 2.00 (H) 0.44 - 1.00 mg/dL   Calcium 7.4 (L) 8.9 - 10.3 mg/dL   GFR, Estimated 24 (L) >60 mL/min   Anion gap 14 5 - 15  Phosphorus     Status: None   Collection Time: 10/28/20 12:47 AM  Result Value Ref Range   Phosphorus 4.2 2.5 - 4.6 mg/dL  Magnesium     Status: Abnormal   Collection Time: 10/28/20 12:47 AM  Result Value Ref Range   Magnesium 1.5 (L) 1.7 - 2.4 mg/dL  MRSA PCR Screening     Status: None   Collection Time: 10/28/20  1:02 AM   Specimen: Nasopharyngeal  Result Value Ref Range   MRSA by PCR NEGATIVE NEGATIVE  Urinalysis, Complete w Microscopic     Status: Abnormal   Collection Time: 10/28/20  2:22 AM  Result Value Ref Range   Color,  Urine YELLOW YELLOW   APPearance TURBID (A) CLEAR   Specific Gravity, Urine 1.013 1.005 - 1.030    pH 5.0 5.0 - 8.0   Glucose, UA NEGATIVE NEGATIVE mg/dL   Hgb urine dipstick MODERATE (A) NEGATIVE   Bilirubin Urine NEGATIVE NEGATIVE   Ketones, ur NEGATIVE NEGATIVE mg/dL   Protein, ur 100 (A) NEGATIVE mg/dL   Nitrite NEGATIVE NEGATIVE   Leukocytes,Ua LARGE (A) NEGATIVE   RBC / HPF 6-10 0 - 5 RBC/hpf   WBC, UA 11-20 0 - 5 WBC/hpf   Bacteria, UA FEW (A) NONE SEEN   Mucus PRESENT   Lactic acid, plasma     Status: Abnormal   Collection Time: 10/28/20  7:46 AM  Result Value Ref Range   Lactic Acid, Venous 3.7 (HH) 0.5 - 1.9 mmol/L  Glucose, capillary     Status: Abnormal   Collection Time: 10/28/20  7:54 AM  Result Value Ref Range   Glucose-Capillary 166 (H) 70 - 99 mg/dL  Glucose, capillary     Status: Abnormal   Collection Time: 10/28/20 12:15 PM  Result Value Ref Range   Glucose-Capillary 168 (H) 70 - 99 mg/dL   Recent Results (from the past 240 hour(s))  Resp Panel by RT-PCR (Flu A&B, Covid) Nasopharyngeal Swab     Status: None   Collection Time: 10/26/2020  6:15 PM   Specimen: Nasopharyngeal Swab; Nasopharyngeal(NP) swabs in vial transport medium  Result Value Ref Range Status   SARS Coronavirus 2 by RT PCR NEGATIVE NEGATIVE Final    Comment: (NOTE) SARS-CoV-2 target nucleic acids are NOT DETECTED.  The SARS-CoV-2 RNA is generally detectable in upper respiratory specimens during the acute phase of infection. The lowest concentration of SARS-CoV-2 viral copies this assay can detect is 138 copies/mL. A negative result does not preclude SARS-Cov-2 infection and should not be used as the sole basis for treatment or other patient management decisions. A negative result may occur with  improper specimen collection/handling, submission of specimen other than nasopharyngeal swab, presence of viral mutation(s) within the areas targeted by this assay, and inadequate number of viral copies(<138 copies/mL). A negative result must be combined with clinical observations, patient  history, and epidemiological information. The expected result is Negative.  Fact Sheet for Patients:  EntrepreneurPulse.com.au  Fact Sheet for Healthcare Providers:  IncredibleEmployment.be  This test is no t yet approved or cleared by the [place] FDA and  has been authorized for detection and/or diagnosis of SARS-CoV-2 by FDA under an Emergency Use Authorization (EUA). This EUA will remain  in effect (meaning this test can be used) for the duration of the COVID-19 declaration under Section 564(b)(1) of the Act, 21 U.S.C.section 360bbb-3(b)(1), unless the authorization is terminated  or revoked sooner.       Influenza A by PCR NEGATIVE NEGATIVE Final   Influenza B by PCR NEGATIVE NEGATIVE Final    Comment: (NOTE) The Xpert Xpress SARS-CoV-2/FLU/RSV plus assay is intended as an aid in the diagnosis of influenza from Nasopharyngeal swab specimens and should not be used as a sole basis for treatment. Nasal washings and aspirates are unacceptable for Xpert Xpress SARS-CoV-2/FLU/RSV testing.  Fact Sheet for Patients: EntrepreneurPulse.com.au  Fact Sheet for Healthcare Providers: IncredibleEmployment.be  This test is not yet approved or cleared by the [place] FDA and has been authorized for detection and/or diagnosis of SARS-CoV-2 by FDA under an Emergency Use Authorization (EUA). This EUA will remain in effect (meaning this test can be used) for the duration of  the COVID-19 declaration under Section 564(b)(1) of the Act, 21 U.S.C. section 360bbb-3(b)(1), unless the authorization is terminated or revoked.  Performed at [facility], [place] [address] [place], [place] [address]   Body fluid culture (includes gram stain)     Status: None (Preliminary result)   Collection Time: 10/16/2020 11:38 PM   Specimen: Peritoneal Washings; Peritoneal Fluid  Result Value Ref Range Status   Specimen  Description PERITONEAL  Final   Special Requests NONE  Final   Gram Stain   Final    ABUNDANT WBC PRESENT, PREDOMINANTLY PMN NO ORGANISMS SEEN Performed at [facility], [address], [place], [place] [address]    Culture PENDING  Incomplete   Report Status PENDING  Incomplete  MRSA PCR Screening     Status: None   Collection Time: 10/28/20  1:02 AM   Specimen: Nasopharyngeal  Result Value Ref Range Status   MRSA by PCR NEGATIVE NEGATIVE Final    Comment:        The GeneXpert MRSA Assay (FDA approved for NASAL specimens only), is one component of a comprehensive MRSA colonization surveillance program. It is not intended to diagnose MRSA infection nor to guide or monitor treatment for MRSA infections. Performed at [facility], [place] [address] [place], [place] [address]     Renal Function: Recent Labs    10/17/2020 1814 10/26/2020 1846 10/28/20 0047  CREATININE 2.04* 1.80* 2.00*   Estimated Creatinine Clearance: 20.4 mL/min (A) (by C-G formula based on SCr of 2 mg/dL (H)).  Radiologic Imaging: CT ABDOMEN PELVIS WO CONTRAST  Result Date: 10/16/2020 CLINICAL DATA:  Abdominal pain and hypotension. Sudden onset of abdominal pain today. Appendectomy last month. EXAM: CT ABDOMEN AND PELVIS WITHOUT CONTRAST TECHNIQUE: Multidetector CT imaging of the abdomen and pelvis was performed following the standard protocol without IV contrast. COMPARISON:  Most recent CT 09/27/2020 FINDINGS: Lower chest: Small left pleural effusion improved from prior exam. Previous right pleural effusion is resolved. Chronic lung disease with interstitial coarsening. Mitral annulus calcifications. Decreased density of the blood pool consistent with anemia. Hepatobiliary: No focal hepatic abnormality on noncontrast exam. Suggestion of nodular hepatic contours. Calcified gallstones within distended gallbladder. Common bile duct is not well-defined, no evidence of biliary dilatation. No  choledocholithiasis. Pancreas: Near completely fatty replaced. No ductal dilatation or inflammation. Spleen: Normal in size.  No focal abnormality. Adrenals/Urinary Tract: Normal adrenal glands. There is a left nephroureteral stent in place. There is air in the ureter adjacent to the stent is well as in the left renal collecting system. No definite renal parenchymal air. 2.8 cm cyst in the mid left kidney. Renal parenchymal thinning of both kidneys. No hydronephrosis. There is mild right perinephric edema. No evidence of renal calculi. Air-fluid level in the bladder which is otherwise decompressed. Stomach/Bowel: Bowel evaluation is limited in the absence of enteric contrast and presence of intra-abdominal ascites. No evidence of gastric wall thickening. No small bowel distension. Equivocal small bowel wall thickening involving bowel loops in the left abdomen versus reactive due to adjacent ascites, for example series 3, image 52. Majority of the colon is decompressed. Appendectomy. Vascular/Lymphatic: Aortic atherosclerosis. No aortic aneurysm. Infrarenal IVC filter in place. Left lateral strut abuts the aortic wall, unchanged from prior exam. Limited assessment for adenopathy given ascites. Reproductive: Uterine calcifications are likely vascular. Left adnexa obscured by streak artifact from left hip arthroplasty. No evidence of adnexal mass. Other: Moderate large volume of abdominopelvic ascites, some of the  fluid is mildly complex. There may be some loculations in the right upper quadrant. No internal air or focal fluid collection. Generalized subcutaneous edema consistent with third spacing. Musculoskeletal: Chronic L1 compression fracture. Osteopenia/osteoporosis. Prior right hip Girdlestone procedure. Left hip arthroplasty. IMPRESSION: 1. Moderate large volume of abdominopelvic ascites, some of the fluid is mildly complex. There may be some loculations in the right upper quadrant. No internal air or focal  fluid collection. 2. Left nephroureteral stent in place. Air in the left ureter and left renal collecting system, likely related to instrumentation, possibility of emphysematous pyelitis is also considered. No renal parenchymal air or hydronephrosis. Air-fluid level in the bladder which is otherwise decompressed. 3. Equivocal small bowel wall thickening involving bowel loops in the left abdomen versus reactive due to adjacent ascites. 4. Suggestion of nodular hepatic contours, suspicious for cirrhosis. 5. Cholelithiasis. 6. Small left pleural effusion, improved from prior exam. Previous right pleural effusion is resolved. 7. Additional chronic findings as described. Aortic Atherosclerosis (ICD10-I70.0). Electronically Signed   By: Keith Rake M.D.   On: 11/01/2020 21:34   DG Chest Port 1 View  Result Date: 10/28/2020 CLINICAL DATA:  Concern for sepsis, evaluate for abnormality EXAM: PORTABLE CHEST 1 VIEW COMPARISON:  Radiograph 09/21/2020, CT abdomen pelvis 09/20/2020 for evaluation bases FINDINGS: There are bilateral coarse interstitial opacities within the mid to lower lungs, similar to comparison exams albeit with some increasingly coalescent opacity along right heart border and left lung base. Suspect at least small left pleural effusion as well. No pneumothorax. The aorta is calcified. The remaining cardiomediastinal contours are unremarkable. The osseous structures appear diffusely demineralized which may limit detection of small or nondisplaced fractures. Remote posttraumatic deformity of the proximal left humerus is similar to comparison as well. No acute osseous abnormalities. Mild edematous changes in the soft tissues. IMPRESSION: 1. Bilateral coarse interstitial opacities within the mid to lower lungs, similar to comparison exams albeit with some increasingly coalescent opacity along the right heart border and left lung base, could reflect developing infection or edema on a background of chronic  interstitial disease and scarring. 2. Suspect at least small left pleural effusion. 3. Remote chronic deformity of the left proximal humerus. Electronically Signed   By: Lovena Le M.D.   On: 11/09/2020 19:53    I independently reviewed the above imaging studies.  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 Had stent placed in October urgently--stent properly positioned currently.  Pt now w/ significant ascites that I do not feel is necessarily related to past UTI. Some mild fluid around rt kidney is  Not too worrisome to me (ascitic fluid w/ low Cr).   Currently would cover for possible UTI but no further GU intervention necessary.Marland Kitchen

## 2020-10-28 NOTE — Progress Notes (Signed)
Unsuccessful on arterial line placement. Pressure held with no hematoma.

## 2020-10-28 NOTE — Progress Notes (Signed)
PCCM Brief Progress Note   Number of PMNs in peritoneal fluid, turbidity raises concern for secondary peritonitis. Reviewed CT A/P with radiology, although noncontrast, they felt confident there was no clear drainable intra-abdominal abscess. While changes may be largely chronic, left kidney raised most concern for active infection on this study and assessment of gallbladder limited by ascites/anasarca. Will discuss emphysematous pyelo with urology in AM unless there is deterioration overnight, and if peritoneal fluid/serum bilirubin significant elevated then would consider another HIDA. For now, despite discussion of the patient's poor baseline functional status, multimorbidity and FTT with further decline over the last 9 months which would likely limit her candidacy for any surgical procedures, the patient's daughter does believe the patient would wish to pursue aggressive measures for infectious source control if she is a candidate.  Whitney [name], Pulmonary/Critical Care

## 2020-10-28 NOTE — Progress Notes (Signed)
NAMERaymonde [name], MRN:  [id number], DOB:  [birth year], LOS: 1 ADMISSION DATE:  10/19/2020, CONSULTATION DATE:  10/29/2020 REFERRING MD:  Reather Converse, CHIEF COMPLAINT:  Abdominal pain  Brief History   85yF with septic and hypovolemic shock with likely intraabdominal source  History of present illness   85yF with DM2, cirrhosis, CKD3, chronic R hip fracture, recent admission for emphysematous cystitis and pyelitis due to ESBL klebsiella, last discharged 11/23 from admission for acute appendicitis s/p appendectomy 09/18/20. Her course was complicated by recurrent abdominal pain, found to have imaging suggestive of acute cholecystitis although interpretation challengign in setting of her anasarca - she was not felt at the time to be candidate for c-tube (IR concerned at the time for long term infectious complication in setting of her ascites) or surgery due to frailty. When she was discharged her daughter says that she barely ate or drank anything. This evening she developed sudden onset sharp lower abdominal pain prompting their visit to the ED. She's had no nausea, vomiting, diarrhea, hematochezia/melena, fever.  In ED she was given 2L IVF, meropenem, started on levophed through a peripheral IV  Past Medical History  DM2 Cirrhosis [facility] Events   11/08/2020 admitted  Consults:    Procedures:  Paracentesis 12/17 CBC 12/17 >> Arterial line 12/17 >>   Significant Diagnostic Tests:  CT A/P 12/17 with rapid accumulation of complex appearing ascites, emphysematous pyelo which may have acute component, bowel wall thickening, cholelithiasis  Micro Data:  12/17 BCx >> Peritoneal fluid 12/17 >> Urine 12/18 >>   Antimicrobials:  12/17 meropenem >>   Interim history/subjective:   Norepinephrine at 18 I/O+ 2.5 L total Body fluid creatinine 2.0 and peritoneal fluid  Objective   Blood pressure (!) 134/40, pulse 99, temperature 98.5 F (36.9 C), temperature source Axillary,  resp. rate (!) 21, weight 73 kg, SpO2 95 %.        Intake/Output Summary (Last 24 hours) at 10/28/2020 0909 Last data filed at 10/28/2020 0600 Gross per 24 hour  Intake 2621.35 ml  Output 40 ml  Net 2581.35 ml   Filed Weights   10/28/20 0015 10/28/20 0319  Weight: 73 kg 73 kg    Examination: General: Awake, interacts, no longer moaning HENT: Oropharynx dry, poor dentition, pupils equal Lungs: Normal work of breathing, decreased at both bases, no wheezing Cardiovascular: Regular, borderline tachycardic, no murmur Abdomen: Slightly distended, soft but diffusely tender to any palpation especially right upper and right lower quadrants. Extremities: Warm, 2+ edema Neuro: Awake, interacts, understands some English and follows commands.  Good upper extremity strength  Resolved Hospital Problem list   n/a  Assessment & Plan:   # shock: favor septic with intraabdominal source (?secondary peritonitis) vs GU source and hypovolemic.  -Follow culture data, currently pending -Continue empiric meropenem based on previous culture data, history ESBL organism -Wean norepinephrine -Continue IV fluid resuscitation, bolus LR 12/18 a.m. and continue 150 cc/h through 12/18 -Continue stress dose hydrocortisone -Consider urology or general surgery involvement 12/18 to discuss CT abdomen, source control.  # Ascites: suspect secondary peritonitis.  -Antibiotics as above -Follow culture data, peritoneal fluid pending -Will discuss options with general surgery, urology.  Body fluid creatinine is 2.0 in the peritoneal fluid.  Her frailty will limit her candidacy for invasive procedures. -No evidence hyperbilirubinemia or transaminitis, insetting questionable recent cholecystitis (drainage considered but not done)  # AKI on CKD.  Creatinine 09/2020 1.4-1.6: -Aggressive IV fluid resuscitation -Follow urine output, BMP  # Lactic  acidosis: -Following lactic acid trend for clearance with volume  resuscitation  # Goals of care: last admission palliative followed  -Will involve palliative care for assistance   Best practice:  Diet: npo for with sips for now Pain/Anxiety/Delirium protocol (if indicated): no VAP protocol (if indicated): no DVT prophylaxis: hold till procedures this evening completed GI prophylaxis: not indicated Glucose control: SSI Mobility: bed level Code Status: Full Family Communication: Dr. Verlee Monte updated with daughter at bedside 12/17.  Disposition: MICU  Labs   CBC: Recent Labs  Lab 10/18/2020 1814 11/08/2020 1846 10/28/20 0047  WBC 9.4  --  18.3*  NEUTROABS 8.7*  --   --   HGB 10.4* 11.6* 9.7*  HCT 35.4* 34.0* 32.2*  MCV 97.3  --  94.2  PLT 250  --  PLATELET CLUMPS NOTED ON SMEAR, UNABLE TO ESTIMATE    Basic Metabolic Panel: Recent Labs  Lab 10/20/2020 1814 11/04/2020 1846 10/28/20 0047  NA 143 142 138  K 3.5 3.6 3.3*  CL 103 104 104  CO2 23  --  20*  GLUCOSE 161* 157* 215*  BUN 42* 40* 39*  CREATININE 2.04* 1.80* 2.00*  CALCIUM 7.4*  --  7.4*  MG  --   --  1.5*  PHOS  --   --  4.2   GFR: Estimated Creatinine Clearance: 20.4 mL/min (A) (by C-G formula based on SCr of 2 mg/dL (H)). Recent Labs  Lab 10/11/2020 1814 10/15/2020 1900 10/29/2020 2150 10/28/20 0047 10/28/20 0746  WBC 9.4  --   --  18.3*  --   LATICACIDVEN  --  5.3* 8.0* 6.4* 3.7*    Liver Function Tests: Recent Labs  Lab 10/18/2020 1814  AST 28  ALT 12  ALKPHOS 88  BILITOT 1.1  PROT 6.3*  ALBUMIN 1.2*   Recent Labs  Lab 10/28/20 0047  LIPASE 10*   No results for input(s): AMMONIA in the last 168 hours.  ABG    Component Value Date/Time   TCO2 23 11/06/2020 1846     Coagulation Profile: Recent Labs  Lab 10/14/2020 1814  INR 1.5*    Cardiac Enzymes: No results for input(s): CKTOTAL, CKMB, CKMBINDEX, TROPONINI in the last 168 hours.  HbA1C: Hgb A1c MFr Bld  Date/Time Value Ref Range Status  08/18/2020 01:35 AM 7.8 (H) 4.8 - 5.6 % Final    Comment:     (NOTE) Pre diabetes:          5.7%-6.4%  Diabetes:              >6.4%  Glycemic control for   <7.0% adults with diabetes     CBG: Recent Labs  Lab 11/09/2020 2203 10/28/20 0040 10/28/20 0754  GLUCAP 157* 183* 166*    Critical care time: 33 min    This patient is critically ill with septic shock; which, requires frequent high complexity decision making, assessment, support, evaluation, and titration of therapies. This was completed through the application of advanced monitoring technologies and extensive interpretation of multiple databases. During this encounter critical care time was devoted to patient care services described in this note for 33 minutes.  Baltazar Apo, MD, PhD 10/28/2020, 9:10 AM [facility] [phone] or if no answer [phone]

## 2020-10-28 NOTE — Plan of Care (Signed)

## 2020-10-28 NOTE — Procedures (Signed)
Central Venous Catheter Insertion Procedure Note  Whitney [name]  [id number]  [birth year]  Date:10/28/20  Time:2:18 AM   Provider Performing:Hina Gupta Jerilynn Mages Verlee Monte   Procedure: Insertion of Non-tunneled Central Venous [phone]) with US guidance (93235)   Indication(s) Medication administration  Consent Verbal consent obtained after discussion of risks/benefits with patient's daughter at bedside  Anesthesia Topical only with 1% lidocaine   Timeout Verified patient identification, verified procedure, site/side was marked, verified correct patient position, special equipment/implants available, medications/allergies/relevant history reviewed, required imaging and test results available.  Sterile Technique Maximal sterile technique including full sterile barrier drape, hand hygiene, sterile gown, sterile gloves, mask, hair covering, sterile ultrasound probe cover (if used).  Procedure Description Area of catheter insertion was cleaned with chlorhexidine and draped in sterile fashion.  With real-time ultrasound guidance a central venous catheter was placed into the left femoral vein. Nonpulsatile blood flow and easy flushing noted in all ports.  The catheter was sutured in place and sterile dressing applied.  Complications/Tolerance None; patient tolerated the procedure well. Chest X-ray is ordered to verify placement for internal jugular or subclavian cannulation.   Chest x-ray is not ordered for femoral cannulation.  EBL Minimal  Specimen(s) None

## 2020-10-28 NOTE — Consult Note (Signed)
Reason for Consult: abdominal pain Referring Physician: Baltazar Apo, M.D.  Whitney [name] is an 85 y.o. female.  HPI: 85 yo female with complex history of failure to thrive with multiple hospitalizations presents with sepsis of unknown etiology. She is complaining of abdominal pain. Pain is in the right lower quadrant. She has not had anything to eat in multiple days. She does not remember the last time she had a bowel movement. She underwent paracentesis with large number of WBCs in the fluid.  Past Medical History:  Diagnosis Date  . CKD (chronic kidney disease)   . Complication of anesthesia    PER FAMILY PATIENT HAD A SEIZURE WHEN " THEY PUT A ROD IN HER LEG "  . Diabetes mellitus without complication [facility])     Past Surgical History:  Procedure Laterality Date  . APPENDECTOMY  09/18/2020  . CYSTOSCOPY W/ URETERAL STENT PLACEMENT Left 08/18/2020   Procedure: CYSTOSCOPY WITH RETROGRADE PYELOGRAM/URETERAL STENT PLACEMENT;  Surgeon: [name], MD;  Location: [place];  Service: Urology;  Laterality: Left;  . IR FLUORO GUIDE CV LINE RIGHT  08/23/2020  . IR REMOVAL TUN CV CATH W/O FL  08/31/2020  . IR US GUIDE VASC ACCESS RIGHT  08/23/2020  . LAPAROSCOPIC APPENDECTOMY N/A 09/18/2020   Procedure: APPENDECTOMY LAPAROSCOPIC;  Surgeon: Coralie Keens, MD;  Location: [place];  Service: General;  Laterality: N/A;  . TOTAL HIP ARTHROPLASTY Right 03/05/2020   Procedure: HIP GIRDLE STONE;  Surgeon: Leandrew Koyanagi, MD;  Location: [place];  Service: Orthopedics;  Laterality: Right;    No family history on file.  Social History:  reports that she has never smoked. She has never used smokeless tobacco. She reports that she does not drink alcohol and does not use drugs.  Allergies:  Allergies  Allergen Reactions  . Succinylcholine Other (See Comments)    No formal diagnosis of pseudocholinesterase deficiency; however, prolonged paralysis during anesthetics on 09/18/20 and 08/18/20.   .  Milk-Related Compounds Diarrhea    Medications: I have reviewed the patient's current medications.  Results for orders placed or performed during the hospital encounter of 10/31/2020 (from the past 48 hour(s))  Comprehensive metabolic panel     Status: Abnormal   Collection Time: 11/09/2020  6:14 PM  Result Value Ref Range   Sodium 143 135 - 145 mmol/L   Potassium 3.5 3.5 - 5.1 mmol/L   Chloride 103 98 - 111 mmol/L   CO2 23 22 - 32 mmol/L   Glucose, Bld 161 (H) 70 - 99 mg/dL    Comment: Glucose reference range applies only to samples taken after fasting for at least 8 hours.   BUN 42 (H) 8 - 23 mg/dL   Creatinine, Ser 2.04 (H) 0.44 - 1.00 mg/dL   Calcium 7.4 (L) 8.9 - 10.3 mg/dL   Total Protein 6.3 (L) 6.5 - 8.1 g/dL   Albumin 1.2 (L) 3.5 - 5.0 g/dL   AST 28 15 - 41 U/L   ALT 12 0 - 44 U/L   Alkaline Phosphatase 88 38 - 126 U/L   Total Bilirubin 1.1 0.3 - 1.2 mg/dL   GFR, Estimated 24 (L) >60 mL/min    Comment: (NOTE) Calculated using the CKD-EPI Creatinine Equation (2021)    Anion gap 17 (H) 5 - 15    Comment: Performed at [facility], [place] [address] [place], [place] [address]  CBC WITH DIFFERENTIAL     Status: Abnormal   Collection Time: 11/04/2020  6:14  PM  Result Value Ref Range   WBC 9.4 4.0 - 10.5 K/uL   RBC 3.64 (L) 3.87 - 5.11 MIL/uL   Hemoglobin 10.4 (L) 12.0 - 15.0 g/dL   HCT 35.4 (L) 36.0 - 46.0 %   MCV 97.3 80.0 - 100.0 fL   MCH 28.6 26.0 - 34.0 pg   MCHC 29.4 (L) 30.0 - 36.0 g/dL   RDW 19.9 (H) 11.5 - 15.5 %   Platelets 250 150 - 400 K/uL   nRBC 0.4 (H) 0.0 - 0.2 %   Neutrophils Relative % 93 %   Neutro Abs 8.7 (H) 1.7 - 7.7 K/uL   Lymphocytes Relative 4 %   Lymphs Abs 0.4 (L) 0.7 - 4.0 K/uL   Monocytes Relative 3 %   Monocytes Absolute 0.3 0.1 - 1.0 K/uL   Eosinophils Relative 0 %   Eosinophils Absolute 0.0 0.0 - 0.5 K/uL   Basophils Relative 0 %   Basophils Absolute 0.0 0.0 - 0.1 K/uL   WBC Morphology See Note     Comment: Increased Bands. >20%  Bands   nRBC 1 (H) 0 /100 WBC   Abs Immature Granulocytes 0.00 0.00 - 0.07 K/uL    Comment: Performed at [facility], [place] [address], [place], [place] [address]  Protime-INR     Status: Abnormal   Collection Time: 11/03/2020  6:14 PM  Result Value Ref Range   Prothrombin Time 17.1 (H) 11.4 - 15.2 seconds   INR 1.5 (H) 0.8 - 1.2    Comment: (NOTE) INR goal varies based on device and disease states. Performed at [facility], [place] [address], [place], [place] [address]   APTT     Status: None   Collection Time: 11/02/2020  6:14 PM  Result Value Ref Range   aPTT 32 24 - 36 seconds    Comment: Performed at [place] [address] [place], [place] [address]  Blood culture (routine single)     Status: None (Preliminary result)   Collection Time: 10/16/2020  6:14 PM   Specimen: BLOOD  Result Value Ref Range   Specimen Description BLOOD RIGHT ANTECUBITAL    Special Requests      BOTTLES DRAWN AEROBIC AND ANAEROBIC Blood Culture adequate volume   Culture  Setup Time      GRAM POSITIVE COCCI IN CLUSTERS AEROBIC BOTTLE ONLY Organism ID to follow Performed at [facility], [place] [address], [place],  [address]    Culture GRAM POSITIVE COCCI    Report Status PENDING   Resp Panel by RT-PCR (Flu A&B, Covid) Nasopharyngeal Swab     Status: None   Collection Time: 10/25/2020  6:15 PM   Specimen: Nasopharyngeal Swab; Nasopharyngeal(NP) swabs in vial transport medium  Result Value Ref Range   SARS Coronavirus 2 by RT PCR NEGATIVE NEGATIVE    Comment: (NOTE) SARS-CoV-2 target nucleic acids are NOT DETECTED.  The SARS-CoV-2 RNA is generally detectable in upper respiratory specimens during the acute phase of infection. The lowest concentration of SARS-CoV-2 viral copies this assay can detect is 138 copies/mL. A negative result does not preclude SARS-Cov-2 infection and should not be used as the sole basis for treatment or other patient management decisions. A  negative result may occur with  improper specimen collection/handling, submission of specimen other than nasopharyngeal swab, presence of viral mutation(s) within the areas targeted by this assay, and inadequate number of viral copies(<138 copies/mL). A negative result must be combined with  clinical observations, patient history, and epidemiological information. The expected result is Negative.  Fact Sheet for Patients:  EntrepreneurPulse.com.au  Fact Sheet for Healthcare Providers:  IncredibleEmployment.be  This test is no t yet approved or cleared by the [place] FDA and  has been authorized for detection and/or diagnosis of SARS-CoV-2 by FDA under an Emergency Use Authorization (EUA). This EUA will remain  in effect (meaning this test can be used) for the duration of the COVID-19 declaration under Section 564(b)(1) of the Act, 21 U.S.C.section 360bbb-3(b)(1), unless the authorization is terminated  or revoked sooner.       Influenza A by PCR NEGATIVE NEGATIVE   Influenza B by PCR NEGATIVE NEGATIVE    Comment: (NOTE) The Xpert Xpress SARS-CoV-2/FLU/RSV plus assay is intended as an aid in the diagnosis of influenza from Nasopharyngeal swab specimens and should not be used as a sole basis for treatment. Nasal washings and aspirates are unacceptable for Xpert Xpress SARS-CoV-2/FLU/RSV testing.  Fact Sheet for Patients: EntrepreneurPulse.com.au  Fact Sheet for Healthcare Providers: IncredibleEmployment.be  This test is not yet approved or cleared by the [place] FDA and has been authorized for detection and/or diagnosis of SARS-CoV-2 by FDA under an Emergency Use Authorization (EUA). This EUA will remain in effect (meaning this test can be used) for the duration of the COVID-19 declaration under Section 564(b)(1) of the Act, 21 U.S.C. section 360bbb-3(b)(1), unless the authorization is terminated  or revoked.  Performed at [facility], [place] [address] [place], [place] [address]   I-stat chem 8, ED (not at [facility] or [facility])     Status: Abnormal   Collection Time: 10/29/2020  6:46 PM  Result Value Ref Range   Sodium 142 135 - 145 mmol/L   Potassium 3.6 3.5 - 5.1 mmol/L   Chloride 104 98 - 111 mmol/L   BUN 40 (H) 8 - 23 mg/dL   Creatinine, Ser 1.80 (H) 0.44 - 1.00 mg/dL   Glucose, Bld 157 (H) 70 - 99 mg/dL    Comment: Glucose reference range applies only to samples taken after fasting for at least 8 hours.   Calcium, Ion 0.89 (LL) 1.15 - 1.40 mmol/L   TCO2 23 22 - 32 mmol/L   Hemoglobin 11.6 (L) 12.0 - 15.0 g/dL   HCT 34.0 (L) 36.0 - 46.0 %   Comment NOTIFIED PHYSICIAN   Type and screen [place]     Status: None   Collection Time: 11/07/2020  6:47 PM  Result Value Ref Range   ABO/RH(D) A POS    Antibody Screen NEG    Sample Expiration      10/30/2020,2359 Performed at [facility], [place] [address], [place], [place] [address]   Lactic acid, plasma     Status: Abnormal   Collection Time: 10/23/2020  7:00 PM  Result Value Ref Range   Lactic Acid, Venous 5.3 (HH) 0.5 - 1.9 mmol/L    Comment: CRITICAL RESULT CALLED TO, READ BACK BY AND VERIFIED WITH: Rick Duff RN 2005 [id number] K FORSYTH Performed at [facility], [place] [address] [place], [place] [address]   Lactic acid, plasma     Status: Abnormal   Collection Time: 10/13/2020  9:50 PM  Result Value Ref Range   Lactic Acid, Venous 8.0 (HH) 0.5 - 1.9 mmol/L    Comment: CRITICAL VALUE NOTED.  VALUE IS CONSISTENT WITH PREVIOUSLY REPORTED AND CALLED VALUE. Performed at [facility], [place],  [place] [address]   CBG monitoring, ED     Status: Abnormal   Collection Time: 10/25/2020 10:03 PM  Result Value Ref Range   Glucose-Capillary 157 (H) 70 - 99 mg/dL    Comment: Glucose reference range applies only to samples taken after fasting for at least 8 hours.  Body fluid cell count with  differential     Status: Abnormal   Collection Time: 11/09/2020 11:08 PM  Result Value Ref Range   Fluid Type-FCT PERITONEAL CYTO     Comment: CORRECTED ON 12/18 AT 0003: PREVIOUSLY REPORTED AS Peritoneal   Color, Fluid STRAW (A) YELLOW   Appearance, Fluid TURBID (A) CLEAR   Total Nucleated Cell Count, Fluid 8,820 (H) 0 - 1,000 cu mm   Neutrophil Count, Fluid 90 (H) 0 - 25 %   Lymphs, Fluid 1 %   Monocyte-Macrophage-Serous Fluid 9 (L) 50 - 90 %    Comment: Performed at [place] [address] [place], [place] [address]  Albumin, pleural or peritoneal fluid     Status: None   Collection Time: 10/17/2020 11:08 PM  Result Value Ref Range   Albumin, Fluid <1.0 g/dL   Fluid Type-FALB PERITONEAL CYTO     Comment: Performed at [facility], [place] [address] [place], [place] [address] CORRECTED ON 12/18 AT 0003: PREVIOUSLY REPORTED AS PERITONEAL CAVITY   Protein, pleural or peritoneal fluid     Status: None   Collection Time: 10/11/2020 11:08 PM  Result Value Ref Range   Total protein, fluid <3.0 g/dL   Fluid Type-FTP PERITONEAL CYTO     Comment: Performed at [facility], [place] [address], [place], [place] [address] CORRECTED ON 12/18 AT 0003: PREVIOUSLY REPORTED AS PERITONEAL CAVITY   Creatinine, fluid (pleural, peritoneal, JP Drainage)     Status: None   Collection Time: 10/22/2020 11:08 PM  Result Value Ref Range   Creat, Fluid 2.0 mg/dL    Comment: (NOTE) No normal range established for this test Results should be evaluated in conjunction with serum values    Fluid Type-FCRE PERITONEAL CYTO     Comment: Performed at [place] [address], [place], [place] [address] CORRECTED ON 12/18 AT 0003: PREVIOUSLY REPORTED AS Peritoneal   Lactate dehydrogenase (pleural or peritoneal fluid)     Status: Abnormal   Collection Time: 11/03/2020 11:08 PM  Result Value Ref Range   LD, Fluid 84 (H) 3 - 23 U/L    Comment: (NOTE) Results should be evaluated in  conjunction with serum values    Fluid Type-FLDH Peritoneal     Comment: Performed at [facility], [place] [address], [place], [place] [address]  Glucose, pleural or peritoneal fluid     Status: None   Collection Time: 10/20/2020 11:08 PM  Result Value Ref Range   Glucose, Fluid 129 mg/dL    Comment: (NOTE) No normal range established for this test Results should be evaluated in conjunction with serum values    Fluid Type-FGLU Peritoneal     Comment: Performed at [facility], [place] [address], [place], [place] [address]  Amylase, pleural or peritoneal fluid     Status: None   Collection Time: 11/07/2020 11:38 PM  Result Value Ref Range   Amylase, Fluid 7 U/L    Comment: NO NORMAL RANGE ESTABLISHED FOR THIS TEST Performed at [facility], [address], [place], [place] [address]    Fluid Type-FAMY PERITONEAL CYTO  Comment: Performed at [facility], [place] [address] [place], [place] [address] CORRECTED ON 12/18 AT 0004: PREVIOUSLY REPORTED AS PERITONEAL CAVITY   Body fluid culture (includes gram stain)     Status: None (Preliminary result)   Collection Time: 10/22/2020 11:38 PM   Specimen: Peritoneal Washings; Peritoneal Fluid  Result Value Ref Range   Specimen Description PERITONEAL    Special Requests NONE    Gram Stain      ABUNDANT WBC PRESENT, PREDOMINANTLY PMN NO ORGANISMS SEEN Performed at [facility], [address], [place], [place] [address]    Culture PENDING    Report Status PENDING   Glucose, capillary     Status: Abnormal   Collection Time: 10/28/20 12:40 AM  Result Value Ref Range   Glucose-Capillary 183 (H) 70 - 99 mg/dL    Comment: Glucose reference range applies only to samples taken after fasting for at least 8 hours.  Lactic acid, plasma     Status: Abnormal   Collection Time: 10/28/20 12:47 AM  Result Value Ref Range   Lactic Acid, Venous 6.4 (HH) 0.5 - 1.9 mmol/L    Comment: CRITICAL VALUE NOTED.  VALUE IS CONSISTENT  WITH PREVIOUSLY REPORTED AND CALLED VALUE. Performed at [facility], [place] [address] [place], [place] [address]   Lipase, blood     Status: Abnormal   Collection Time: 10/28/20 12:47 AM  Result Value Ref Range   Lipase 10 (L) 11 - 51 U/L    Comment: Performed at [place] [address] [place], [place] [address]  CBC     Status: Abnormal   Collection Time: 10/28/20 12:47 AM  Result Value Ref Range   WBC 18.3 (H) 4.0 - 10.5 K/uL    Comment: WHITE COUNT CONFIRMED ON SMEAR   RBC 3.42 (L) 3.87 - 5.11 MIL/uL   Hemoglobin 9.7 (L) 12.0 - 15.0 g/dL   HCT 32.2 (L) 36.0 - 46.0 %   MCV 94.2 80.0 - 100.0 fL   MCH 28.4 26.0 - 34.0 pg   MCHC 30.1 30.0 - 36.0 g/dL   RDW 19.9 (H) 11.5 - 15.5 %   Platelets PLATELET CLUMPS NOTED ON SMEAR, UNABLE TO ESTIMATE 150 - 400 K/uL   nRBC 0.3 (H) 0.0 - 0.2 %    Comment: Performed at [facility], [place] [address] [place], [place] [address]  Basic metabolic panel     Status: Abnormal   Collection Time: 10/28/20 12:47 AM  Result Value Ref Range   Sodium 138 135 - 145 mmol/L   Potassium 3.3 (L) 3.5 - 5.1 mmol/L   Chloride 104 98 - 111 mmol/L   CO2 20 (L) 22 - 32 mmol/L   Glucose, Bld 215 (H) 70 - 99 mg/dL    Comment: Glucose reference range applies only to samples taken after fasting for at least 8 hours.   BUN 39 (H) 8 - 23 mg/dL   Creatinine, Ser 2.00 (H) 0.44 - 1.00 mg/dL   Calcium 7.4 (L) 8.9 - 10.3 mg/dL   GFR, Estimated 24 (L) >60 mL/min    Comment: (NOTE) Calculated using the CKD-EPI Creatinine Equation (2021)    Anion gap 14 5 - 15    Comment: Performed at [facility] [address] [place], [place] [address]  Phosphorus     Status: None   Collection Time: 10/28/20 12:47 AM  Result Value Ref Range   Phosphorus 4.2 2.5 - 4.6 mg/dL  Comment: Performed at [facility], [place] [address] [place], [place] [address]  Magnesium     Status: Abnormal   Collection Time: 10/28/20 12:47 AM  Result Value Ref Range    Magnesium 1.5 (L) 1.7 - 2.4 mg/dL    Comment: Performed at [facility] [address] [place], [place] [address]  MRSA PCR Screening     Status: None   Collection Time: 10/28/20  1:02 AM   Specimen: Nasopharyngeal  Result Value Ref Range   MRSA by PCR NEGATIVE NEGATIVE    Comment:        The GeneXpert MRSA Assay (FDA approved for NASAL specimens only), is one component of a comprehensive MRSA colonization surveillance program. It is not intended to diagnose MRSA infection nor to guide or monitor treatment for MRSA infections. Performed at [facility], [place] [address], [place], [place] [address]   Urinalysis, Complete w Microscopic     Status: Abnormal   Collection Time: 10/28/20  2:22 AM  Result Value Ref Range   Color, Urine YELLOW YELLOW   APPearance TURBID (A) CLEAR   Specific Gravity, Urine 1.013 1.005 - 1.030   pH 5.0 5.0 - 8.0   Glucose, UA NEGATIVE NEGATIVE mg/dL   Hgb urine dipstick MODERATE (A) NEGATIVE   Bilirubin Urine NEGATIVE NEGATIVE   Ketones, ur NEGATIVE NEGATIVE mg/dL   Protein, ur 100 (A) NEGATIVE mg/dL   Nitrite NEGATIVE NEGATIVE   Leukocytes,Ua LARGE (A) NEGATIVE   RBC / HPF 6-10 0 - 5 RBC/hpf   WBC, UA 11-20 0 - 5 WBC/hpf   Bacteria, UA FEW (A) NONE SEEN   Mucus PRESENT     Comment: Performed at [facility], [address], [place], [place] [address]  Glucose, capillary     Status: Abnormal   Collection Time: 10/28/20  4:07 AM  Result Value Ref Range   Glucose-Capillary 120 (H) 70 - 99 mg/dL    Comment: Glucose reference range applies only to samples taken after fasting for at least 8 hours.  Lactic acid, plasma     Status: Abnormal   Collection Time: 10/28/20  7:46 AM  Result Value Ref Range   Lactic Acid, Venous 3.7 (HH) 0.5 - 1.9 mmol/L    Comment: CRITICAL VALUE NOTED.  VALUE IS CONSISTENT WITH PREVIOUSLY REPORTED AND CALLED VALUE. Performed at [facility], [place] [address] [place], [place] [address]   Glucose,  capillary     Status: Abnormal   Collection Time: 10/28/20  7:54 AM  Result Value Ref Range   Glucose-Capillary 166 (H) 70 - 99 mg/dL    Comment: Glucose reference range applies only to samples taken after fasting for at least 8 hours.  Glucose, capillary     Status: Abnormal   Collection Time: 10/28/20 12:15 PM  Result Value Ref Range   Glucose-Capillary 168 (H) 70 - 99 mg/dL    Comment: Glucose reference range applies only to samples taken after fasting for at least 8 hours.  Glucose, capillary     Status: Abnormal   Collection Time: 10/28/20  3:48 PM  Result Value Ref Range   Glucose-Capillary 156 (H) 70 - 99 mg/dL    Comment: Glucose reference range applies only to samples taken after fasting for at least 8 hours.  Lactic acid, plasma     Status: None   Collection Time: 10/28/20  5:26 PM  Result Value Ref Range   Lactic Acid, Venous 1.3 0.5 -  1.9 mmol/L    Comment: Performed at [facility], [place] [address] [place],  [address]  Basic metabolic panel     Status: Abnormal   Collection Time: 10/28/20  5:26 PM  Result Value Ref Range   Sodium 140 135 - 145 mmol/L   Potassium 3.3 (L) 3.5 - 5.1 mmol/L   Chloride 105 98 - 111 mmol/L   CO2 25 22 - 32 mmol/L   Glucose, Bld 171 (H) 70 - 99 mg/dL    Comment: Glucose reference range applies only to samples taken after fasting for at least 8 hours.   BUN 37 (H) 8 - 23 mg/dL   Creatinine, Ser 1.92 (H) 0.44 - 1.00 mg/dL   Calcium 7.5 (L) 8.9 - 10.3 mg/dL   GFR, Estimated 26 (L) >60 mL/min    Comment: (NOTE) Calculated using the CKD-EPI Creatinine Equation (2021)    Anion gap 10 5 - 15    Comment: Performed at [facility] [address] [place], [place] [address]  Glucose, capillary     Status: Abnormal   Collection Time: 10/28/20  7:26 PM  Result Value Ref Range   Glucose-Capillary 147 (H) 70 - 99 mg/dL    Comment: Glucose reference range applies only to samples taken after fasting for at least 8 hours.    CT  ABDOMEN PELVIS WO CONTRAST  Result Date: 10/21/2020 CLINICAL DATA:  Abdominal pain and hypotension. Sudden onset of abdominal pain today. Appendectomy last month. EXAM: CT ABDOMEN AND PELVIS WITHOUT CONTRAST TECHNIQUE: Multidetector CT imaging of the abdomen and pelvis was performed following the standard protocol without IV contrast. COMPARISON:  Most recent CT 09/27/2020 FINDINGS: Lower chest: Small left pleural effusion improved from prior exam. Previous right pleural effusion is resolved. Chronic lung disease with interstitial coarsening. Mitral annulus calcifications. Decreased density of the blood pool consistent with anemia. Hepatobiliary: No focal hepatic abnormality on noncontrast exam. Suggestion of nodular hepatic contours. Calcified gallstones within distended gallbladder. Common bile duct is not well-defined, no evidence of biliary dilatation. No choledocholithiasis. Pancreas: Near completely fatty replaced. No ductal dilatation or inflammation. Spleen: Normal in size.  No focal abnormality. Adrenals/Urinary Tract: Normal adrenal glands. There is a left nephroureteral stent in place. There is air in the ureter adjacent to the stent is well as in the left renal collecting system. No definite renal parenchymal air. 2.8 cm cyst in the mid left kidney. Renal parenchymal thinning of both kidneys. No hydronephrosis. There is mild right perinephric edema. No evidence of renal calculi. Air-fluid level in the bladder which is otherwise decompressed. Stomach/Bowel: Bowel evaluation is limited in the absence of enteric contrast and presence of intra-abdominal ascites. No evidence of gastric wall thickening. No small bowel distension. Equivocal small bowel wall thickening involving bowel loops in the left abdomen versus reactive due to adjacent ascites, for example series 3, image 52. Majority of the colon is decompressed. Appendectomy. Vascular/Lymphatic: Aortic atherosclerosis. No aortic aneurysm. Infrarenal  IVC filter in place. Left lateral strut abuts the aortic wall, unchanged from prior exam. Limited assessment for adenopathy given ascites. Reproductive: Uterine calcifications are likely vascular. Left adnexa obscured by streak artifact from left hip arthroplasty. No evidence of adnexal mass. Other: Moderate large volume of abdominopelvic ascites, some of the fluid is mildly complex. There may be some loculations in the right upper quadrant. No internal air or focal fluid collection. Generalized subcutaneous edema consistent with third spacing. Musculoskeletal: Chronic L1 compression fracture. Osteopenia/osteoporosis. Prior right hip Girdlestone  procedure. Left hip arthroplasty. IMPRESSION: 1. Moderate large volume of abdominopelvic ascites, some of the fluid is mildly complex. There may be some loculations in the right upper quadrant. No internal air or focal fluid collection. 2. Left nephroureteral stent in place. Air in the left ureter and left renal collecting system, likely related to instrumentation, possibility of emphysematous pyelitis is also considered. No renal parenchymal air or hydronephrosis. Air-fluid level in the bladder which is otherwise decompressed. 3. Equivocal small bowel wall thickening involving bowel loops in the left abdomen versus reactive due to adjacent ascites. 4. Suggestion of nodular hepatic contours, suspicious for cirrhosis. 5. Cholelithiasis. 6. Small left pleural effusion, improved from prior exam. Previous right pleural effusion is resolved. 7. Additional chronic findings as described. Aortic Atherosclerosis (ICD10-I70.0). Electronically Signed   By: Keith Rake M.D.   On: 10/14/2020 21:34   DG Chest Port 1 View  Result Date: 10/15/2020 CLINICAL DATA:  Concern for sepsis, evaluate for abnormality EXAM: PORTABLE CHEST 1 VIEW COMPARISON:  Radiograph 09/21/2020, CT abdomen pelvis 09/20/2020 for evaluation bases FINDINGS: There are bilateral coarse interstitial opacities  within the mid to lower lungs, similar to comparison exams albeit with some increasingly coalescent opacity along right heart border and left lung base. Suspect at least small left pleural effusion as well. No pneumothorax. The aorta is calcified. The remaining cardiomediastinal contours are unremarkable. The osseous structures appear diffusely demineralized which may limit detection of small or nondisplaced fractures. Remote posttraumatic deformity of the proximal left humerus is similar to comparison as well. No acute osseous abnormalities. Mild edematous changes in the soft tissues. IMPRESSION: 1. Bilateral coarse interstitial opacities within the mid to lower lungs, similar to comparison exams albeit with some increasingly coalescent opacity along the right heart border and left lung base, could reflect developing infection or edema on a background of chronic interstitial disease and scarring. 2. Suspect at least small left pleural effusion. 3. Remote chronic deformity of the left proximal humerus. Electronically Signed   By: Lovena Le M.D.   On: 10/12/2020 19:53    Review of Systems  Constitutional: Positive for malaise/fatigue.  Gastrointestinal: Positive for abdominal pain and constipation.  All other systems reviewed and are negative.   PE Blood pressure (!) 111/45, pulse 95, temperature 98 F (36.7 C), temperature source Axillary, resp. rate (!) 26, weight 73 kg, SpO2 93 %. Constitutional: NAD; conversant; no deformities Eyes: Moist conjunctiva; no lid lag; anicteric; PERRL Neck: Trachea midline; no thyromegaly Lungs: Normal respiratory effort; no tactile fremitus CV: RRR; no palpable thrills; no pitting edema GI: Abd soft, TTP right lower quadrant; no palpable hepatosplenomegaly MSK: unable to assess gait; no clubbing/cyanosis Psychiatric: Appropriate affect; alert and oriented x3 Lymphatic: No palpable cervical or axillary lymphadenopathy Skin: No major subcutaneous nodules. Warm  and dry   Assessment/Plan: 85 yo female with sepsis. I reviewed CT scan. She has a large amount of ascites. I did not see significant inflammation of the intestine or areas of abdomen that would point to a source. She does still have stones in her gallbladder but nontender in that region. Interestingly her intestines are not distended. -agree with currently plan to treat with antibiotics and follow up cultures  Arta Bruce Raffaele Derise 10/28/2020, 8:53 PM

## 2020-10-28 NOTE — Progress Notes (Signed)
   Palliative Medicine Inpatient Follow Up Note   PMT is very familiar with this patient.  Per chart review patients daughter has stated, "she does not wish to see anyone from the palliative care team. She has seen them on prior admissions and declines consult from them at this time."  We will remove the consult, however if our services are needed at anytime moving forward please place a consult for [place] to see miss Whitney [name] promptly.  ______________________________________________________________________________________ [facility] Team Team Cell Phone: [phone] Please utilize secure chat with additional questions, if there is no response within 30 minutes please call the above phone number  Palliative Medicine Team providers are available by phone from 7am to 7pm daily and can be reached through the team cell phone.  Should this patient require assistance outside of these hours, please call the patient's attending physician.

## 2020-10-28 NOTE — Progress Notes (Signed)
[facility] Progress Note Patient Name: Whitney [name] DOB: 1935-02-27 MRN: [id number]   Date of Service  10/28/2020  HPI/Events of Note  Patient was admitted with septic shock of suspected intra-abdominal etiology- urinary tract vs biliary tract, work up is in progress and patient is broadly covered with Meropenem on an empiric basis.  eICU Interventions  New Patient Evaluation completed.        Kerry Kass Kramer Hanrahan 10/28/2020, 12:33 AM

## 2020-10-28 NOTE — Progress Notes (Signed)
Irrigated pt's foley with 20cc sterile saline. Minimal turbid output after, barely more than what was put in. Pt's urine is thick and sludge-like, probably blocking the lumen from further output.   Per primary RN Paiton, pt had >500cc present on bladder scan. Pt shows obvious discomfort with irrigation. May require CBI through a larger foley to expel this urine.  Per CCM note, MD to discuss with urology in AM.

## 2020-10-28 NOTE — Procedures (Signed)
Paracentesis Procedure Note  Whitney [name]  [id number]  1935-05-02  Date:10/28/20  Time:2:32 AM   Provider Performing:Jasmaine Rochel M Verlee Monte    Procedure: Paracentesis with imaging guidance ([id number])  Indication(s) Ascites  Consent Verbal consent obtained from the patient's daughter after discussion of risks and benefits.  Anesthesia Topical only with 1% lidocaine    Time Out Verified patient identification, verified procedure, site/side was marked, verified correct patient position, special equipment/implants available, medications/allergies/relevant history reviewed, required imaging and test results available.   Sterile Technique Maximal sterile technique including full sterile barrier drape, hand hygiene, sterile gown, sterile gloves, mask, hair covering, sterile ultrasound probe cover (if used).   Procedure Description Ultrasound used to identify appropriate peritoneal anatomy for placement and overlying skin marked.  Area of drainage cleaned and draped in sterile fashion. Lidocaine was used to anesthetize the skin and subcutaneous tissue.  1400 cc's of slightly turbid appearing yellow fluid was drained. Catheter then removed and bandaid applied to site.   Complications/Tolerance None; patient tolerated the procedure well.   EBL Minimal   Specimen(s) Peritoneal fluid

## 2020-10-28 NOTE — Progress Notes (Signed)
[facility] Progress Note Patient Name: Whitney [name] DOB: 1935/08/14 MRN: [id number]   Date of Service  10/28/2020  HPI/Events of Note  Bedside RN reports turbid "milky" urine.  eICU Interventions  Urine culture sent already.        Kerry Kass Wiliam Cauthorn 10/28/2020, 3:28 AM

## 2020-10-28 NOTE — Sepsis Progress Note (Signed)
Sepsis monitoring complete. Trending lactic acid results. Following as ICU patient.

## 2020-10-28 NOTE — Progress Notes (Signed)
Pharmacy Antibiotic Note  Whitney [name] is a 85 y.o. female admitted on 10/29/2020 with septic shock. Pt was started on meropenem with AKI and concerns for intraabdominal source, now noted to have Staph epi in 1 of 1 blood cultures. Pharmacy has been consulted for vancomycin dosing. SCr has improved slightly, still minimal UOP.  Plan: -Vancomycin 1500mg  IV x1 -Follow Cr tomorrow prior to scheduling vancomycin (likely needs q48h dosing unless improves) -Recheck blood cultures   Weight: 73 kg (160 lb 15 oz)  Temp (24hrs), Avg:98.2 F (36.8 C), Min:97 F (36.1 C), Max:98.9 F (37.2 C)  Recent Labs  Lab 11/09/2020 1814 11/10/2020 1846 10/16/2020 1900 10/13/2020 2150 10/28/20 0047 10/28/20 0746 10/28/20 1726  WBC 9.4  --   --   --  18.3*  --   --   CREATININE 2.04* 1.80*  --   --  2.00*  --  1.92*  LATICACIDVEN  --   --  5.3* 8.0* 6.4* 3.7* 1.3    Estimated Creatinine Clearance: 21.2 mL/min (A) (by C-G formula based on SCr of 1.92 mg/dL (H)).    Allergies  Allergen Reactions   Succinylcholine Other (See Comments)    No formal diagnosis of pseudocholinesterase deficiency; however, prolonged paralysis during anesthetics on 09/18/20 and 08/18/20.    Milk-Related Compounds Diarrhea    Antimicrobials this admission: 12/17 Meropenem >> 12/18 Vancomycin >>  Microbiology results: 12/17 BCx: MRSE  Thank you for allowing pharmacy to be a part of this patients care.  Arrie Senate, PharmD, BCPS, [facility] Clinical Pharmacist [phone] Please check AMION for all [place] numbers 10/28/2020

## 2020-10-29 ENCOUNTER — Encounter: Payer: Self-pay | Admitting: Emergency Medicine

## 2020-10-29 LAB — BASIC METABOLIC PANEL
Anion gap: 10 (ref 5–15)
Anion gap: 12 (ref 5–15)
BUN: 37 mg/dL — ABNORMAL HIGH (ref 8–23)
BUN: 39 mg/dL — ABNORMAL HIGH (ref 8–23)
CO2: 22 mmol/L (ref 22–32)
CO2: 23 mmol/L (ref 22–32)
Calcium: 7.4 mg/dL — ABNORMAL LOW (ref 8.9–10.3)
Calcium: 7.8 mg/dL — ABNORMAL LOW (ref 8.9–10.3)
Chloride: 107 mmol/L (ref 98–111)
Chloride: 107 mmol/L (ref 98–111)
Creatinine, Ser: 1.84 mg/dL — ABNORMAL HIGH (ref 0.44–1.00)
Creatinine, Ser: 1.93 mg/dL — ABNORMAL HIGH (ref 0.44–1.00)
GFR, Estimated: 27 mL/min — ABNORMAL LOW (ref >60)
GFR, Estimated: 25 mL/min — ABNORMAL LOW (ref >60)
Glucose, Bld: 176 mg/dL — ABNORMAL HIGH (ref 70–99)
Glucose, Bld: 157 mg/dL — ABNORMAL HIGH (ref 70–99)
Potassium: 6.2 mmol/L — ABNORMAL HIGH (ref 3.5–5.1)
Potassium: 4.1 mmol/L (ref 3.5–5.1)
Sodium: 139 mmol/L (ref 135–145)
Sodium: 142 mmol/L (ref 135–145)

## 2020-10-29 LAB — LACTIC ACID, PLASMA: Lactic Acid, Venous: 1 mmol/L (ref 0.5–1.9)

## 2020-10-29 LAB — CBC
HCT: 26.3 % — ABNORMAL LOW (ref 36.0–46.0)
Hemoglobin: 8.1 g/dL — ABNORMAL LOW (ref 12.0–15.0)
MCH: 28.4 pg (ref 26.0–34.0)
MCHC: 30.8 g/dL (ref 30.0–36.0)
MCV: 92.3 fL (ref 80.0–100.0)
Platelets: 177 10*3/uL (ref 150–400)
RBC: 2.85 MIL/uL — ABNORMAL LOW (ref 3.87–5.11)
RDW: 19.4 % — ABNORMAL HIGH (ref 11.5–15.5)
WBC: 20.1 10*3/uL — ABNORMAL HIGH (ref 4.0–10.5)
nRBC: 0 % (ref 0.0–0.2)

## 2020-10-29 LAB — HEPATIC FUNCTION PANEL
ALT: 12 U/L (ref 0–44)
AST: 22 U/L (ref 15–41)
Albumin: 1.5 g/dL — ABNORMAL LOW (ref 3.5–5.0)
Alkaline Phosphatase: 70 U/L (ref 38–126)
Bilirubin, Direct: 0.4 mg/dL — ABNORMAL HIGH (ref 0.0–0.2)
Indirect Bilirubin: 1.4 mg/dL — ABNORMAL HIGH (ref 0.3–0.9)
Total Bilirubin: 1.8 mg/dL — ABNORMAL HIGH (ref 0.3–1.2)
Total Protein: 5.3 g/dL — ABNORMAL LOW (ref 6.5–8.1)

## 2020-10-29 LAB — GLUCOSE, CAPILLARY
Glucose-Capillary: 145 mg/dL — ABNORMAL HIGH (ref 70–99)
Glucose-Capillary: 139 mg/dL — ABNORMAL HIGH (ref 70–99)
Glucose-Capillary: 153 mg/dL — ABNORMAL HIGH (ref 70–99)
Glucose-Capillary: 134 mg/dL — ABNORMAL HIGH (ref 70–99)
Glucose-Capillary: 152 mg/dL — ABNORMAL HIGH (ref 70–99)

## 2020-10-29 LAB — CULTURE, BLOOD (SINGLE): Special Requests: ADEQUATE

## 2020-10-29 LAB — URINE CULTURE

## 2020-10-29 LAB — MAGNESIUM: Magnesium: 2 mg/dL (ref 1.7–2.4)

## 2020-10-29 MED ORDER — LACTATED RINGERS IV SOLN: INTRAVENOUS | Status: DC

## 2020-10-29 MED ORDER — SODIUM ZIRCONIUM CYCLOSILICATE 10 G PO PACK
10.0000 g | PACK | Freq: Once | ORAL | Status: DC
  Filled 2020-10-29: qty 1

## 2020-10-29 MED ORDER — SODIUM POLYSTYRENE SULFONATE 15 GM/60ML PO SUSP
30.0000 g | Freq: Once | ORAL | Status: AC
  Administered 2020-10-29: 07:00:00 30 g via RECTAL
  Filled 2020-10-29: qty 120

## 2020-10-29 NOTE — Progress Notes (Addendum)
Subjective/Chief Complaint: Still with abdominal pain but less   Objective: Vital signs in last 24 hours: Temp:  [97.9 F (36.6 C)-98.9 F (37.2 C)] 97.9 F (36.6 C) (12/19 0317) Pulse Rate:  [80-132] 132 (12/19 0700) Resp:  [18-29] 25 (12/19 0715) BP: (83-149)/(40-74) 88/74 (12/19 0715) SpO2:  [86 %-100 %] 89 % (12/19 0700) Last BM Date: 10/28/20  Intake/Output from previous day: 12/18 0701 - 12/19 0700 In: 2548.7 [I.V.:1821.4; IV Piggyback:727.3] Out: 625 [Urine:625] Intake/Output this shift: No intake/output data recorded.  Exam: Awake and alert Abdomen obese, distended with tenderness greatest in the RUQ  Lab Results:  Recent Labs    10/28/20 0047 10/29/20 0348  WBC 18.3* 20.1*  HGB 9.7* 8.1*  HCT 32.2* 26.3*  PLT PLATELET CLUMPS NOTED ON SMEAR, UNABLE TO ESTIMATE 177   BMET Recent Labs    10/28/20 1726 10/29/20 0348  NA 140 139  K 3.3* 6.2*  CL 105 107  CO2 25 22  GLUCOSE 171* 176*  BUN 37* 37*  CREATININE 1.92* 1.84*  CALCIUM 7.5* 7.4*   PT/INR Recent Labs    10/25/2020 1814  LABPROT 17.1*  INR 1.5*   ABG No results for input(s): PHART, HCO3 in the last 72 hours.  Invalid input(s): PCO2, PO2  Studies/Results: CT ABDOMEN PELVIS WO CONTRAST  Result Date: 10/23/2020 CLINICAL DATA:  Abdominal pain and hypotension. Sudden onset of abdominal pain today. Appendectomy last month. EXAM: CT ABDOMEN AND PELVIS WITHOUT CONTRAST TECHNIQUE: Multidetector CT imaging of the abdomen and pelvis was performed following the standard protocol without IV contrast. COMPARISON:  Most recent CT 09/27/2020 FINDINGS: Lower chest: Small left pleural effusion improved from prior exam. Previous right pleural effusion is resolved. Chronic lung disease with interstitial coarsening. Mitral annulus calcifications. Decreased density of the blood pool consistent with anemia. Hepatobiliary: No focal hepatic abnormality on noncontrast exam. Suggestion of nodular hepatic  contours. Calcified gallstones within distended gallbladder. Common bile duct is not well-defined, no evidence of biliary dilatation. No choledocholithiasis. Pancreas: Near completely fatty replaced. No ductal dilatation or inflammation. Spleen: Normal in size.  No focal abnormality. Adrenals/Urinary Tract: Normal adrenal glands. There is a left nephroureteral stent in place. There is air in the ureter adjacent to the stent is well as in the left renal collecting system. No definite renal parenchymal air. 2.8 cm cyst in the mid left kidney. Renal parenchymal thinning of both kidneys. No hydronephrosis. There is mild right perinephric edema. No evidence of renal calculi. Air-fluid level in the bladder which is otherwise decompressed. Stomach/Bowel: Bowel evaluation is limited in the absence of enteric contrast and presence of intra-abdominal ascites. No evidence of gastric wall thickening. No small bowel distension. Equivocal small bowel wall thickening involving bowel loops in the left abdomen versus reactive due to adjacent ascites, for example series 3, image 52. Majority of the colon is decompressed. Appendectomy. Vascular/Lymphatic: Aortic atherosclerosis. No aortic aneurysm. Infrarenal IVC filter in place. Left lateral strut abuts the aortic wall, unchanged from prior exam. Limited assessment for adenopathy given ascites. Reproductive: Uterine calcifications are likely vascular. Left adnexa obscured by streak artifact from left hip arthroplasty. No evidence of adnexal mass. Other: Moderate large volume of abdominopelvic ascites, some of the fluid is mildly complex. There may be some loculations in the right upper quadrant. No internal air or focal fluid collection. Generalized subcutaneous edema consistent with third spacing. Musculoskeletal: Chronic L1 compression fracture. Osteopenia/osteoporosis. Prior right hip Girdlestone procedure. Left hip arthroplasty. IMPRESSION: 1. Moderate large volume of  abdominopelvic  ascites, some of the fluid is mildly complex. There may be some loculations in the right upper quadrant. No internal air or focal fluid collection. 2. Left nephroureteral stent in place. Air in the left ureter and left renal collecting system, likely related to instrumentation, possibility of emphysematous pyelitis is also considered. No renal parenchymal air or hydronephrosis. Air-fluid level in the bladder which is otherwise decompressed. 3. Equivocal small bowel wall thickening involving bowel loops in the left abdomen versus reactive due to adjacent ascites. 4. Suggestion of nodular hepatic contours, suspicious for cirrhosis. 5. Cholelithiasis. 6. Small left pleural effusion, improved from prior exam. Previous right pleural effusion is resolved. 7. Additional chronic findings as described. Aortic Atherosclerosis (ICD10-I70.0). Electronically Signed   By: Keith Rake M.D.   On: 10/24/2020 21:34   DG Chest Port 1 View  Result Date: 10/24/2020 CLINICAL DATA:  Concern for sepsis, evaluate for abnormality EXAM: PORTABLE CHEST 1 VIEW COMPARISON:  Radiograph 09/21/2020, CT abdomen pelvis 09/20/2020 for evaluation bases FINDINGS: There are bilateral coarse interstitial opacities within the mid to lower lungs, similar to comparison exams albeit with some increasingly coalescent opacity along right heart border and left lung base. Suspect at least small left pleural effusion as well. No pneumothorax. The aorta is calcified. The remaining cardiomediastinal contours are unremarkable. The osseous structures appear diffusely demineralized which may limit detection of small or nondisplaced fractures. Remote posttraumatic deformity of the proximal left humerus is similar to comparison as well. No acute osseous abnormalities. Mild edematous changes in the soft tissues. IMPRESSION: 1. Bilateral coarse interstitial opacities within the mid to lower lungs, similar to comparison exams albeit with some  increasingly coalescent opacity along the right heart border and left lung base, could reflect developing infection or edema on a background of chronic interstitial disease and scarring. 2. Suspect at least small left pleural effusion. 3. Remote chronic deformity of the left proximal humerus. Electronically Signed   By: Lovena Le M.D.   On: 10/26/2020 19:53    Anti-infectives: Anti-infectives (From admission, onward)   Start     Dose/Rate Route Frequency Ordered Stop   10/29/20 1000  anidulafungin (ERAXIS) 100 mg in sodium chloride 0.9 % 100 mL IVPB  Status:  Discontinued       "Followed by" Linked Group Details   100 mg 78 mL/hr over 100 Minutes Intravenous Every 24 hours 10/28/20 0207 10/28/20 0240   10/28/20 2230  vancomycin (VANCOREADY) IVPB 1500 mg/300 mL        1,500 mg 150 mL/hr over 120 Minutes Intravenous  Once 10/28/20 2121 10/28/20 2347   10/28/20 2121  vancomycin variable dose per unstable renal function (pharmacist dosing)         Does not apply See admin instructions 10/28/20 2121     10/28/20 1800  meropenem (MERREM) 1 g in sodium chloride 0.9 % 100 mL IVPB        1 g 200 mL/hr over 30 Minutes Intravenous Every 12 hours 10/28/20 0217     10/28/20 0300  anidulafungin (ERAXIS) 200 mg in sodium chloride 0.9 % 200 mL IVPB  Status:  Discontinued       "Followed by" Linked Group Details   200 mg 78 mL/hr over 200 Minutes Intravenous Every 24 hours 10/28/20 0207 10/28/20 0240   10/28/20 0300  meropenem (MERREM) 1 g in sodium chloride 0.9 % 100 mL IVPB        1 g 200 mL/hr over 30 Minutes Intravenous  Once 10/28/20 0215 10/28/20 0440  10/30/2020 1915  ceFEPIme (MAXIPIME) 2 g in sodium chloride 0.9 % 100 mL IVPB  Status:  Discontinued        2 g 200 mL/hr over 30 Minutes Intravenous  Once 10/16/2020 1902 11/06/2020 1909   10/24/2020 1915  piperacillin-tazobactam (ZOSYN) IVPB 3.375 g  Status:  Discontinued        3.375 g 12.5 mL/hr over 240 Minutes Intravenous Once 11/01/2020 1902  10/19/2020 1909   10/18/2020 1915  meropenem (MERREM) 500 mg in sodium chloride 0.9 % 100 mL IVPB        500 mg 200 mL/hr over 30 Minutes Intravenous  Once 10/20/2020 1909 10/16/2020 2155      Assessment/Plan: Sepsis  At the time of surgery which was 6 weeks ago, her appendix showed minimal inflammation and I felt it was almost secondary to some other process.  Her liver showed evidence of cirrhosis. There was a white, milky like fluid (not purulence) in the pelvis and above the liver of uncertain etiology. The final path on the appendix showed mild appendicitis at the tip.  I have reviewed her CT and there is no evidence of an appendix stump leak (no abscess or air in the RLQ).  Cultures on the fluid are pending.  I recommend a repeat HIDA scan to again potentially rule in or out cholecystitis  Consider ID consult  Surgery will continue to follow.  No plans currently for a laparotomy   LOS: 2 days    Coralie Keens MD 10/29/2020   Use Amion to contact on call provider for questions

## 2020-10-29 NOTE — Progress Notes (Addendum)
[name], MRN:  [id number], DOB:  03/14/1935, LOS: 2 ADMISSION DATE:  10/16/2020, CONSULTATION DATE:  11/03/2020 REFERRING MD:  Reather Converse, CHIEF COMPLAINT:  Abdominal pain  Brief History   85yF with septic and hypovolemic shock with likely intraabdominal source  History of present illness   85yF with DM2, cirrhosis, CKD3, chronic R hip fracture, recent admission for emphysematous cystitis and pyelitis due to ESBL klebsiella, last discharged 11/23 from admission for acute appendicitis s/p appendectomy 09/18/20. Her course was complicated by recurrent abdominal pain, found to have imaging suggestive of acute cholecystitis although interpretation challengign in setting of her anasarca - she was not felt at the time to be candidate for c-tube (IR concerned at the time for long term infectious complication in setting of her ascites) or surgery due to frailty. When she was discharged her daughter says that she barely ate or drank anything. This evening she developed sudden onset sharp lower abdominal pain prompting their visit to the ED. She's had no nausea, vomiting, diarrhea, hematochezia/melena, fever.  In ED she was given 2L IVF, meropenem, started on levophed through a peripheral IV  Past Medical History  DM2 Cirrhosis [facility] Events   10/26/2020 admitted  Consults:  Urology General surgery  Procedures:  Paracentesis 12/17 CBC 12/17 >> Arterial line 12/17 >>   Significant Diagnostic Tests:  CT A/P 12/17 with rapid accumulation of complex appearing ascites, emphysematous pyelo which may have acute component, bowel wall thickening, cholelithiasis  Micro Data:  12/17 BCx >> staph epi on BCD and 1 of 1 culture >  12/18 BCx >>  Peritoneal fluid 12/17 >> neutrophils, no organisms seen >>  Urine 12/18 >> multiple, recollect  Antimicrobials:  12/17 meropenem >>   Interim history/subjective:   Note urology and general surgery consultations Norepinephrine has  been weaned to 6 I/O+ 4.5 L total New hyperkalemia, 6.2 on 12/19, Kayexalate was given Renal function improving, lactic acid is cleared WBC 20  Objective   Blood pressure (!) 88/74, pulse (!) 132, temperature 97.9 F (36.6 C), temperature source Axillary, resp. rate (!) 25, weight 73 kg, SpO2 (!) 89 %.        Intake/Output Summary (Last 24 hours) at 10/29/2020 0742 Last data filed at 10/29/2020 0600 Gross per 24 hour  Intake 2548.73 ml  Output 625 ml  Net 1923.73 ml   Filed Weights   10/28/20 0015 10/28/20 0319  Weight: 73 kg 73 kg    Examination: General: Chronically ill-appearing woman, laying in bed in no distress HENT: Oropharynx dry, poor dentition, pupils equal Lungs: Comfortable respiratory pattern, decreased at both bases, no wheezing or crackles Cardiovascular: Regular, no murmur Abdomen: Less distended, remains diffusely tender with mild rebound tenderness, positive bowel sounds Extremities: 1+ edema Neuro: Awake interacts, follows commands.  Daughter is here to assist with translation  Resolved Hospital Problem list   Lactic acidosis  Assessment & Plan:   # shock: Presumed septic with intraabdominal source (?secondary peritonitis) vs GU source and hypovolemic.  Significance of staph epi bacteremia 1 of 1 unclear -Follow culture data to guide antibiotics -Continue empiric meropenem based on history ESBL Klebsiella -Empiric vancomycin was added 12/19.  I think we can stop it and repeat blood cultures -Continue stress dose steroids -Appreciate urology and general surgery consultants.  No clear role for any invasive procedure at this time.  Repeat HIDA scan recommended to assess for possible gallbladder contribution.  Question whether we may need to repeat CT abdomen going forward  after treating with appropriate antibiotics -Wean norepinephrine as able, making progress -Continue IV fluid resuscitation, LR to 75 cc/h  # Ascites: suspect secondary peritonitis, note  fluid creatinine 2.0 (same as serum creatinine).  -Antibiotics as above -If loculated ascites, may be a role at some point for tube drainage.  Suspect she would need a repeat CT abdomen before we commit to an invasive procedure.  Will discuss with urology, surgery before considering  # AKI on CKD.  Creatinine 09/2020 1.4-1.6: -Follow urine output, BMP with aggressive IV fluid resuscitation and restoration perfusion pressures -Renal dose medications, avoid nephrotoxic medications  # Goals of care: Full code per daughter with all aggressive care desired. -Daughter has stated that she does not want to meet with palliative care services   Best practice:  Diet: npo for with sips for now Pain/Anxiety/Delirium protocol (if indicated): no VAP protocol (if indicated): no DVT prophylaxis: hold till procedures this evening completed GI prophylaxis: not indicated Glucose control: SSI Mobility: bed level Code Status: Full Family Communication: Dr. Lamonte Sakai updated daughter at bedside 12/19 Disposition: MICU  Labs   CBC: Recent Labs  Lab 10/20/2020 1814 11/09/2020 1846 10/28/20 0047 10/29/20 0348  WBC 9.4  --  18.3* 20.1*  NEUTROABS 8.7*  --   --   --   HGB 10.4* 11.6* 9.7* 8.1*  HCT 35.4* 34.0* 32.2* 26.3*  MCV 97.3  --  94.2 92.3  PLT 250  --  PLATELET CLUMPS NOTED ON SMEAR, UNABLE TO ESTIMATE 914    Basic Metabolic Panel: Recent Labs  Lab 10/24/2020 1814 10/25/2020 1846 10/28/20 0047 10/28/20 1726 10/29/20 0348  NA 143 142 138 140 139  K 3.5 3.6 3.3* 3.3* 6.2*  CL 103 104 104 105 107  CO2 23  --  20* 25 22  GLUCOSE 161* 157* 215* 171* 176*  BUN 42* 40* 39* 37* 37*  CREATININE 2.04* 1.80* 2.00* 1.92* 1.84*  CALCIUM 7.4*  --  7.4* 7.5* 7.4*  MG  --   --  1.5*  --  2.0  PHOS  --   --  4.2  --   --    GFR: Estimated Creatinine Clearance: 22.2 mL/min (A) (by C-G formula based on SCr of 1.84 mg/dL (H)). Recent Labs  Lab 11/04/2020 1814 10/16/2020 1900 10/28/20 0047 10/28/20 0746  10/28/20 1726 10/29/20 0348  WBC 9.4  --  18.3*  --   --  20.1*  LATICACIDVEN  --    < > 6.4* 3.7* 1.3 1.0   < > = values in this interval not displayed.    Liver Function Tests: Recent Labs  Lab 10/23/2020 1814 10/29/20 0348  AST 28 22  ALT 12 12  ALKPHOS 88 70  BILITOT 1.1 1.8*  PROT 6.3* 5.3*  ALBUMIN 1.2* 1.5*   Recent Labs  Lab 10/28/20 0047  LIPASE 10*   No results for input(s): AMMONIA in the last 168 hours.  ABG    Component Value Date/Time   TCO2 23 10/11/2020 1846     Coagulation Profile: Recent Labs  Lab 11/05/2020 1814  INR 1.5*    Cardiac Enzymes: No results for input(s): CKTOTAL, CKMB, CKMBINDEX, TROPONINI in the last 168 hours.  HbA1C: Hgb A1c MFr Bld  Date/Time Value Ref Range Status  08/18/2020 01:35 AM 7.8 (H) 4.8 - 5.6 % Final    Comment:    (NOTE) Pre diabetes:          5.7%-6.4%  Diabetes:              >  6.4%  Glycemic control for   <7.0% adults with diabetes     CBG: Recent Labs  Lab 10/28/20 1215 10/28/20 1548 10/28/20 1926 10/28/20 2326 10/29/20 0316  GLUCAP 168* 156* 147* 139* 145*    Critical care time: 32 min    This patient is critically ill with septic shock; which, requires frequent high complexity decision making, assessment, support, evaluation, and titration of therapies. This was completed through the application of advanced monitoring technologies and extensive interpretation of multiple databases. During this encounter critical care time was devoted to patient care services described in this note for 33 minutes.  Baltazar Apo, MD, PhD 10/29/2020, 7:42 AM [facility] [phone] or if no answer [phone]

## 2020-10-29 NOTE — Progress Notes (Signed)
[facility] Progress Note Patient Name: Whitney [name] DOB: [birth year] MRN: [id number]   Date of Service  10/29/2020  HPI/Events of Note  Hyperkalemia - K+ = 6.2. Patient is NPO.  eICU Interventions  Plan: 1. Kayexalate 30 gm PR now. 2. Repeat BMP at 12 noon.      Intervention Category Major Interventions: Electrolyte abnormality - evaluation and management  Janeisha Ryle Eugene 10/29/2020, 5:51 AM

## 2020-10-29 NOTE — Progress Notes (Signed)
Letter for [facility] written

## 2020-10-30 ENCOUNTER — Inpatient Hospital Stay (HOSPITAL_COMMUNITY): Payer: 59

## 2020-10-30 LAB — CBC
HCT: 27 % — ABNORMAL LOW (ref 36.0–46.0)
Hemoglobin: 8.5 g/dL — ABNORMAL LOW (ref 12.0–15.0)
MCH: 28.8 pg (ref 26.0–34.0)
MCHC: 31.5 g/dL (ref 30.0–36.0)
MCV: 91.5 fL (ref 80.0–100.0)
Platelets: 169 10*3/uL (ref 150–400)
RBC: 2.95 MIL/uL — ABNORMAL LOW (ref 3.87–5.11)
RDW: 19.2 % — ABNORMAL HIGH (ref 11.5–15.5)
WBC: 20.2 10*3/uL — ABNORMAL HIGH (ref 4.0–10.5)
nRBC: 0 % (ref 0.0–0.2)

## 2020-10-30 LAB — GLUCOSE, CAPILLARY
Glucose-Capillary: 119 mg/dL — ABNORMAL HIGH (ref 70–99)
Glucose-Capillary: 122 mg/dL — ABNORMAL HIGH (ref 70–99)
Glucose-Capillary: 125 mg/dL — ABNORMAL HIGH (ref 70–99)
Glucose-Capillary: 136 mg/dL — ABNORMAL HIGH (ref 70–99)
Glucose-Capillary: 141 mg/dL — ABNORMAL HIGH (ref 70–99)

## 2020-10-30 LAB — BASIC METABOLIC PANEL
Anion gap: 13 (ref 5–15)
BUN: 38 mg/dL — ABNORMAL HIGH (ref 8–23)
CO2: 20 mmol/L — ABNORMAL LOW (ref 22–32)
Calcium: 7.8 mg/dL — ABNORMAL LOW (ref 8.9–10.3)
Chloride: 108 mmol/L (ref 98–111)
Creatinine, Ser: 1.9 mg/dL — ABNORMAL HIGH (ref 0.44–1.00)
GFR, Estimated: 26 mL/min — ABNORMAL LOW (ref >60)
Glucose, Bld: 158 mg/dL — ABNORMAL HIGH (ref 70–99)
Potassium: 3.9 mmol/L (ref 3.5–5.1)
Sodium: 141 mmol/L (ref 135–145)

## 2020-10-30 MED ORDER — TECHNETIUM TC 99M MEBROFENIN IV KIT
5.3000 | PACK | Freq: Once | INTRAVENOUS | Status: AC | PRN
  Administered 2020-10-30: 15:00:00 5.3 via INTRAVENOUS

## 2020-10-30 MED ORDER — MORPHINE SULFATE (PF) 4 MG/ML IV SOLN
2.5000 mg | Freq: Once | INTRAVENOUS | Status: AC
  Administered 2020-10-30: 17:00:00 2.5 mg via INTRAVENOUS
  Filled 2020-10-30: qty 1

## 2020-10-30 MED ORDER — MORPHINE SULFATE (PF) 4 MG/ML IV SOLN: 2.5000 mg | Freq: Once | INTRAVENOUS | Status: DC

## 2020-10-30 NOTE — Progress Notes (Addendum)
[facility] Note Patient Name: Whitney [name] DOB: Jul 14, 1935 MRN: [id number]   Date of Service  10/30/2020  HPI/Events of Note  Nuclear GB Scan findings consistent with cystic duct obstruction/acute Cholecystitis. Surgery consultant recommends percutaneous cholecystostomy.   eICU Interventions  Plan: 1. Will relay findings to ground team.      Intervention Category Major Interventions: Other:  Lysle Dingwall 10/30/2020, 7:32 PM

## 2020-10-30 NOTE — Progress Notes (Signed)
PCCM interval progress:  HIDA scan significant for acute cholecystitis.  IR notified and will consult in the AM.  Otilio Carpen Marlina Cataldi, PA-C

## 2020-10-30 NOTE — Progress Notes (Signed)
NAMETiphani [name], MRN:  [id number], DOB:  09-Dec-1934, LOS: 3 ADMISSION DATE:  10/12/2020, CONSULTATION DATE:  10/11/2020 REFERRING MD:  Reather Converse, CHIEF COMPLAINT:  Abdominal pain  Brief History   85yF with septic and hypovolemic shock with likely intraabdominal source  History of present illness   85yF with DM2, cirrhosis, CKD3, chronic R hip fracture, recent admission for emphysematous cystitis and pyelitis due to ESBL klebsiella, last discharged 11/23 from admission for acute appendicitis s/p appendectomy 09/18/20. Her course was complicated by recurrent abdominal pain, found to have imaging suggestive of acute cholecystitis although interpretation challengign in setting of her anasarca - she was not felt at the time to be candidate for c-tube (IR concerned at the time for long term infectious complication in setting of her ascites) or surgery due to frailty. When she was discharged her daughter says that she barely ate or drank anything. This evening she developed sudden onset sharp lower abdominal pain prompting their visit to the ED. She's had no nausea, vomiting, diarrhea, hematochezia/melena, fever.  In ED she was given 2L IVF, meropenem, started on levophed through a peripheral IV  Past Medical History  DM2 Cirrhosis [facility] Events   10/15/2020 admitted  Consults:  Urology General surgery  Procedures:  Paracentesis 12/17 CBC 12/17 >> Arterial line 12/17 >>   Significant Diagnostic Tests:  CT A/P 12/17 with rapid accumulation of complex appearing ascites, emphysematous pyelo which may have acute component, bowel wall thickening, cholelithiasis  Micro Data:  12/17 BCx >> staph epi on BCD and 1 of 1 culture >  12/18 BCx >>  Peritoneal fluid 12/17 >> neutrophils, no organisms seen >>  Urine 12/18 >> multiple, recollect  Antimicrobials:  12/17 meropenem >>   Interim history/subjective:  Lying in bed in no acute distress Patient primarily speaks Arabic so  communication is limited Net +5 L Pressor requirement decreasing RN reports no acute events overnight  Objective   Blood pressure (!) 123/49, pulse 94, temperature (!) 97.5 F (36.4 C), temperature source Axillary, resp. rate 19, weight 77 kg, SpO2 99 %.        Intake/Output Summary (Last 24 hours) at 10/30/2020 0908 Last data filed at 10/30/2020 0700 Gross per 24 hour  Intake 1713.14 ml  Output 500 ml  Net 1213.14 ml   Filed Weights   10/28/20 0015 10/28/20 0319 10/30/20 0500  Weight: 73 kg 73 kg 77 kg    Examination: General: Chronically ill appearing elderly female lying in bed in NAD HEENT: [place]/AT, MM pink/moist, PERRL,  Neuro: Alert and interactive, language barrier in place CV: s1s2 regular rate and rhythm, no murmur, rubs, or gallops,  PULM: Bilateral crackles but remains on room air with oxygen saturations 98% and no signs of respiratory distress GI: soft, bowel sounds active in all 4 quadrants, non-tender, non-distended Extremities: warm/dry, 3+ pitting edema tracking up into the lower abdomen edema  Skin: no rashes or lesions  Resolved Hospital Problem list   Lactic acidosis  Assessment & Plan:   Shock  -Presumed septic with intraabdominal source (?secondary peritonitis) vs GU source and hypovolemic.  Significance of staph epi bacteremia 1 of 1 unclear -Empiric vancomycin was stopped 12/19.   -No clear role for any invasive procedure at this time.  Repeat HIDA scan recommended to assess for possible gallbladder contribution.  Question whether we may need to repeat CT abdomen going forward after treating with appropriate antibiotics P: Follow culture data  Continue empiric meropenem and vacn given history  of ESBL Klebsiella  Remains on stress dose steroids  Wean pressor support as able  Continue IV hydration  Now volume positive with pitting edema once off pressors likely will need to address diuretics  Ascites -Suspect secondary peritonitis, note fluid  creatinine 2.0 (same as serum creatinine).  P: Continue antibiotics as above  Will discuss with attending indications for repeat CT imagining today  Urology consulted with no acute GU interventions recommended  He is to be a component of pitting edema in the lower abdomen as well   AKI on CKD.  -Creatinine 09/2020 1.4-1.6, Creatinine 2.0 on admission  P: Follow renal function / urine output Trend Bmet Avoid nephrotoxins Ensure adequate renal perfusion  Renally dose medications  IV hydration  Goals of care:  Full code per daughter with all aggressive care desired. Daughter has stated that she does not want to meet with palliative care services   Best practice:  Diet: npo for with sips for now Pain/Anxiety/Delirium protocol (if indicated): no VAP protocol (if indicated): no DVT prophylaxis: hold till procedures this evening completed GI prophylaxis: not indicated Glucose control: SSI Mobility: bed level Code Status: Full Family Communication: No family at bedside morning of 12/20, will wait for them to arrive and/or call for phone update Disposition: MICU  Labs   CBC: Recent Labs  Lab 10/28/2020 1814 10/22/2020 1846 10/28/20 0047 10/29/20 0348 10/30/20 0501  WBC 9.4  --  18.3* 20.1* 20.2*  NEUTROABS 8.7*  --   --   --   --   HGB 10.4* 11.6* 9.7* 8.1* 8.5*  HCT 35.4* 34.0* 32.2* 26.3* 27.0*  MCV 97.3  --  94.2 92.3 91.5  PLT 250  --  PLATELET CLUMPS NOTED ON SMEAR, UNABLE TO ESTIMATE 177 881    Basic Metabolic Panel: Recent Labs  Lab 10/28/20 0047 10/28/20 1726 10/29/20 0348 10/29/20 0843 10/30/20 0501  NA 138 140 139 142 141  K 3.3* 3.3* 6.2* 4.1 3.9  CL 104 105 107 107 108  CO2 20* 25 22 23  20*  GLUCOSE 215* 171* 176* 157* 158*  BUN 39* 37* 37* 39* 38*  CREATININE 2.00* 1.92* 1.84* 1.93* 1.90*  CALCIUM 7.4* 7.5* 7.4* 7.8* 7.8*  MG 1.5*  --  2.0  --   --   PHOS 4.2  --   --   --   --    GFR: Estimated Creatinine Clearance: 22 mL/min (A) (by C-G formula  based on SCr of 1.9 mg/dL (H)). Recent Labs  Lab 10/31/2020 1814 11/08/2020 1900 10/28/20 0047 10/28/20 0746 10/28/20 1726 10/29/20 0348 10/30/20 0501  WBC 9.4  --  18.3*  --   --  20.1* 20.2*  LATICACIDVEN  --    < > 6.4* 3.7* 1.3 1.0  --    < > = values in this interval not displayed.    Liver Function Tests: Recent Labs  Lab 10/23/2020 1814 10/29/20 0348  AST 28 22  ALT 12 12  ALKPHOS 88 70  BILITOT 1.1 1.8*  PROT 6.3* 5.3*  ALBUMIN 1.2* 1.5*   Recent Labs  Lab 10/28/20 0047  LIPASE 10*   No results for input(s): AMMONIA in the last 168 hours.  ABG    Component Value Date/Time   TCO2 23 11/04/2020 1846     Coagulation Profile: Recent Labs  Lab 11/10/2020 1814  INR 1.5*    Cardiac Enzymes: No results for input(s): CKTOTAL, CKMB, CKMBINDEX, TROPONINI in the last 168 hours.  HbA1C: Hgb A1c MFr Bld  Date/Time Value Ref Range Status  08/18/2020 01:35 AM 7.8 (H) 4.8 - 5.6 % Final    Comment:    (NOTE) Pre diabetes:          5.7%-6.4%  Diabetes:              >6.4%  Glycemic control for   <7.0% adults with diabetes     CBG: Recent Labs  Lab 10/29/20 1513 10/29/20 2023 10/30/20 0010 10/30/20 0442 10/30/20 0821  GLUCAP 134* 152* 141* 125* 122*    Critical care time:    Performed by: Johnsie Cancel  Total critical care time: 38 minutes  Critical care time was exclusive of separately billable procedures and treating other patients.  Critical care was necessary to treat or prevent imminent or life-threatening deterioration.  Critical care was time spent personally by me on the following activities: development of treatment plan with patient and/or surrogate as well as nursing, discussions with consultants, evaluation of patient's response to treatment, examination of patient, obtaining history from patient or surrogate, ordering and performing treatments and interventions, ordering and review of laboratory studies, ordering and review of  radiographic studies, pulse oximetry and re-evaluation of patient's condition.  Johnsie Cancel, NP-C [facility] Contact / Pager information can be found on Amion  10/30/2020, 9:19 AM

## 2020-10-30 NOTE — Progress Notes (Signed)
Progress Note     Subjective: Fish farm manager used and patient was largely non-verbal. She did acknowledge translator initially. Attempted to ask if patient was having pain several times and she provided no response. Patient did not answer orientation questions. She did say thank you as I was leaving the room.   Objective: Vital signs in last 24 hours: Temp:  [97.5 F (36.4 C)-97.8 F (36.6 C)] 97.5 F (36.4 C) (12/20 0818) Pulse Rate:  [27-116] 94 (12/20 0700) Resp:  [14-27] 19 (12/20 0700) BP: (92-148)/(34-135) 123/49 (12/20 0700) SpO2:  [93 %-100 %] 99 % (12/20 0700) Weight:  [77 kg] 77 kg (12/20 0500) Last BM Date: 10/29/20  Intake/Output from previous day: 12/19 0701 - 12/20 0700 In: 1742.3 [I.V.:1530; IV Piggyback:212.3] Out: 500 [Urine:500] Intake/Output this shift: No intake/output data recorded.  PE: General: lethargic but arousable, WD, morbidly obese female who is laying in bed and appears chronically ill  HEENT: sclera anicteric Heart: regular, rate, and rhythm. 3+ pitting edema in bilateral feet Lungs: CTAB, no wheezes, rhonchi, or rales noted.  Respiratory effort nonlabored Abd: soft, NT, ND, +BS     Lab Results:  Recent Labs    10/29/20 0348 10/30/20 0501  WBC 20.1* 20.2*  HGB 8.1* 8.5*  HCT 26.3* 27.0*  PLT 177 169   BMET Recent Labs    10/29/20 0843 10/30/20 0501  NA 142 141  K 4.1 3.9  CL 107 108  CO2 23 20*  GLUCOSE 157* 158*  BUN 39* 38*  CREATININE 1.93* 1.90*  CALCIUM 7.8* 7.8*   PT/INR Recent Labs    10/11/2020 1814  LABPROT 17.1*  INR 1.5*   CMP     Component Value Date/Time   NA 141 10/30/2020 0501   K 3.9 10/30/2020 0501   CL 108 10/30/2020 0501   CO2 20 (L) 10/30/2020 0501   GLUCOSE 158 (H) 10/30/2020 0501   BUN 38 (H) 10/30/2020 0501   CREATININE 1.90 (H) 10/30/2020 0501   CALCIUM 7.8 (L) 10/30/2020 0501   PROT 5.3 (L) 10/29/2020 0348   ALBUMIN 1.5 (L) 10/29/2020 0348   AST 22 10/29/2020 0348   ALT 12  10/29/2020 0348   ALKPHOS 70 10/29/2020 0348   BILITOT 1.8 (H) 10/29/2020 0348   GFRNONAA 26 (L) 10/30/2020 0501   GFRAA 42 (L) 05/27/2020 1033   Lipase     Component Value Date/Time   LIPASE 10 (L) 10/28/2020 0047       Studies/Results: No results found.  Anti-infectives: Anti-infectives (From admission, onward)   Start     Dose/Rate Route Frequency Ordered Stop   10/29/20 1000  anidulafungin (ERAXIS) 100 mg in sodium chloride 0.9 % 100 mL IVPB  Status:  Discontinued       "Followed by" Linked Group Details   100 mg 78 mL/hr over 100 Minutes Intravenous Every 24 hours 10/28/20 0207 10/28/20 0240   10/28/20 2230  vancomycin (VANCOREADY) IVPB 1500 mg/300 mL        1,500 mg 150 mL/hr over 120 Minutes Intravenous  Once 10/28/20 2121 10/28/20 2347   10/28/20 2121  vancomycin variable dose per unstable renal function (pharmacist dosing)  Status:  Discontinued         Does not apply See admin instructions 10/28/20 2121 10/29/20 1133   10/28/20 1800  meropenem (MERREM) 1 g in sodium chloride 0.9 % 100 mL IVPB        1 g 200 mL/hr over 30 Minutes Intravenous Every 12 hours 10/28/20 0217  10/28/20 0300  anidulafungin (ERAXIS) 200 mg in sodium chloride 0.9 % 200 mL IVPB  Status:  Discontinued       "Followed by" Linked Group Details   200 mg 78 mL/hr over 200 Minutes Intravenous Every 24 hours 10/28/20 0207 10/28/20 0240   10/28/20 0300  meropenem (MERREM) 1 g in sodium chloride 0.9 % 100 mL IVPB        1 g 200 mL/hr over 30 Minutes Intravenous  Once 10/28/20 0215 10/28/20 0440   10/13/2020 1915  ceFEPIme (MAXIPIME) 2 g in sodium chloride 0.9 % 100 mL IVPB  Status:  Discontinued        2 g 200 mL/hr over 30 Minutes Intravenous  Once 10/25/2020 1902 11/01/2020 1909   10/21/2020 1915  piperacillin-tazobactam (ZOSYN) IVPB 3.375 g  Status:  Discontinued        3.375 g 12.5 mL/hr over 240 Minutes Intravenous Once 11/09/2020 1902 11/07/2020 1909   11/02/2020 1915  meropenem (MERREM) 500 mg in  sodium chloride 0.9 % 100 mL IVPB        500 mg 200 mL/hr over 30 Minutes Intravenous  Once 11/10/2020 1909 10/26/2020 2155       Assessment/Plan S/p lap appy 6 weeks ago T2DM CKD stage III Chronic R hip fracture  Sepsis - urine culture with multiple species present, recommend recollection  - WBC 20.2, afebrile - remains on levophed  Cirrhosis  Bilious ascites Possible Cholecystitis  - peritoneal fluid cxs pending  - no LFTs today, Tbili 1.8 12/19, LFTs were otherwise normal on 12/19 - recommend HIDA scan, this has been ordered but not done - abdominal exam fairly benign this AM - If HIDA is positive for cholecystitis, would recommend IR consult for possible percutaneous cholecystostomy over laparoscopic cholecystectomy at this time  FEN: NPO, IVF VTE: SCDs, ok to have chemical DVT ppx from a surgical standpoint ID: Merrem 12/17>>  LOS: 3 days    Norm Parcel , [facility] Surgery 10/30/2020, 9:39 AM Please see Amion for pager number during day hours 7:00am-4:30pm

## 2020-10-31 ENCOUNTER — Inpatient Hospital Stay (HOSPITAL_COMMUNITY): Payer: 59

## 2020-10-31 ENCOUNTER — Encounter (HOSPITAL_COMMUNITY): Payer: Self-pay | Admitting: Student

## 2020-10-31 HISTORY — PX: IR PERC CHOLECYSTOSTOMY: IMG2326

## 2020-10-31 HISTORY — PX: IR PARACENTESIS: IMG2679

## 2020-10-31 LAB — GLUCOSE, CAPILLARY
Glucose-Capillary: 118 mg/dL — ABNORMAL HIGH (ref 70–99)
Glucose-Capillary: 122 mg/dL — ABNORMAL HIGH (ref 70–99)
Glucose-Capillary: 125 mg/dL — ABNORMAL HIGH (ref 70–99)
Glucose-Capillary: 129 mg/dL — ABNORMAL HIGH (ref 70–99)
Glucose-Capillary: 142 mg/dL — ABNORMAL HIGH (ref 70–99)
Glucose-Capillary: 158 mg/dL — ABNORMAL HIGH (ref 70–99)

## 2020-10-31 LAB — CBC
HCT: 26.6 % — ABNORMAL LOW (ref 36.0–46.0)
Hemoglobin: 8.4 g/dL — ABNORMAL LOW (ref 12.0–15.0)
MCH: 28.7 pg (ref 26.0–34.0)
MCHC: 31.6 g/dL (ref 30.0–36.0)
MCV: 90.8 fL (ref 80.0–100.0)
Platelets: 153 10*3/uL (ref 150–400)
RBC: 2.93 MIL/uL — ABNORMAL LOW (ref 3.87–5.11)
RDW: 19 % — ABNORMAL HIGH (ref 11.5–15.5)
WBC: 18.3 10*3/uL — ABNORMAL HIGH (ref 4.0–10.5)
nRBC: 0 % (ref 0.0–0.2)

## 2020-10-31 LAB — BASIC METABOLIC PANEL
Anion gap: 13 (ref 5–15)
BUN: 38 mg/dL — ABNORMAL HIGH (ref 8–23)
CO2: 21 mmol/L — ABNORMAL LOW (ref 22–32)
Calcium: 7.9 mg/dL — ABNORMAL LOW (ref 8.9–10.3)
Chloride: 108 mmol/L (ref 98–111)
Creatinine, Ser: 1.85 mg/dL — ABNORMAL HIGH (ref 0.44–1.00)
GFR, Estimated: 27 mL/min — ABNORMAL LOW (ref >60)
Glucose, Bld: 167 mg/dL — ABNORMAL HIGH (ref 70–99)
Potassium: 3.8 mmol/L (ref 3.5–5.1)
Sodium: 142 mmol/L (ref 135–145)

## 2020-10-31 LAB — TOTAL BILIRUBIN, BODY FLUID

## 2020-10-31 LAB — CYTOLOGY - NON PAP

## 2020-10-31 MED ORDER — MIDAZOLAM HCL 2 MG/2ML IJ SOLN
INTRAMUSCULAR | Status: AC
  Filled 2020-10-31: qty 2

## 2020-10-31 MED ORDER — LIDOCAINE HCL 1 % IJ SOLN
INTRAMUSCULAR | Status: AC | PRN
Start: 2020-10-31 — End: 2020-10-31
  Administered 2020-10-31: 10 mL

## 2020-10-31 MED ORDER — ALBUMIN HUMAN 25 % IV SOLN
25.0000 g | Freq: Once | INTRAVENOUS | Status: AC
  Administered 2020-10-31: 12:00:00 25 g via INTRAVENOUS
  Filled 2020-10-31: qty 100

## 2020-10-31 MED ORDER — FENTANYL CITRATE (PF) 100 MCG/2ML IJ SOLN
INTRAMUSCULAR | Status: AC
  Filled 2020-10-31: qty 2

## 2020-10-31 MED ORDER — FENTANYL CITRATE (PF) 100 MCG/2ML IJ SOLN
INTRAMUSCULAR | Status: AC | PRN
  Administered 2020-10-31 (×2): 25 ug via INTRAVENOUS

## 2020-10-31 MED ORDER — MIDAZOLAM HCL 2 MG/2ML IJ SOLN
INTRAMUSCULAR | Status: AC | PRN
Start: 2020-10-31 — End: 2020-10-31
  Administered 2020-10-31 (×2): 0.5 mg via INTRAVENOUS

## 2020-10-31 MED ORDER — LIDOCAINE-EPINEPHRINE 1 %-1:100000 IJ SOLN
INTRAMUSCULAR | Status: AC
  Filled 2020-10-31: qty 1

## 2020-10-31 MED ORDER — IOHEXOL 300 MG/ML  SOLN: 50.0000 mL | Freq: Once | INTRAMUSCULAR | Status: DC | PRN

## 2020-10-31 MED ORDER — FUROSEMIDE 10 MG/ML IJ SOLN
60.0000 mg | Freq: Once | INTRAMUSCULAR | Status: AC
  Administered 2020-10-31: 12:00:00 60 mg via INTRAVENOUS
  Filled 2020-10-31: qty 6

## 2020-10-31 NOTE — Progress Notes (Signed)
Progress Note     Subjective: Patient answers some questions for me this AM. Reports pain in R abdomen.   Objective: Vital signs in last 24 hours: Temp:  [95.9 F (35.5 C)-97.5 F (36.4 C)] 97.3 F (36.3 C) (12/21 0400) Pulse Rate:  [40-107] 98 (12/21 0700) Resp:  [11-24] 13 (12/21 0700) BP: (54-137)/(36-79) 112/48 (12/21 0700) SpO2:  [83 %-100 %] 100 % (12/21 0700) Weight:  [82.8 kg] 82.8 kg (12/21 0403) Last BM Date: 10/29/20  Intake/Output from previous day: 12/20 0701 - 12/21 0700 In: 2073.2 [I.V.:1873.3; IV Piggyback:199.9] Out: 365 [Urine:365] Intake/Output this shift: No intake/output data recorded.  PE: General: lethargic but arousable, WD, morbidly obese female who is laying in bed and appears chronically ill  HEENT: sclera anicteric Heart: irregularly irregular in the 90s to 100s. 3+ pitting edema in BLE Lungs: CTAB, no wheezes, rhonchi, or rales noted.  Respiratory effort nonlabored Abd: soft, ttp in RUQ, ND, +BS   Lab Results:  Recent Labs    10/30/20 0501 10/31/20 0623  WBC 20.2* 18.3*  HGB 8.5* 8.4*  HCT 27.0* 26.6*  PLT 169 153   BMET Recent Labs    10/30/20 0501 10/31/20 0623  NA 141 142  K 3.9 3.8  CL 108 108  CO2 20* 21*  GLUCOSE 158* 167*  BUN 38* 38*  CREATININE 1.90* 1.85*  CALCIUM 7.8* 7.9*   PT/INR No results for input(s): LABPROT, INR in the last 72 hours. CMP     Component Value Date/Time   NA 142 10/31/2020 0623   K 3.8 10/31/2020 0623   CL 108 10/31/2020 0623   CO2 21 (L) 10/31/2020 0623   GLUCOSE 167 (H) 10/31/2020 0623   BUN 38 (H) 10/31/2020 0623   CREATININE 1.85 (H) 10/31/2020 0623   CALCIUM 7.9 (L) 10/31/2020 0623   PROT 5.3 (L) 10/29/2020 0348   ALBUMIN 1.5 (L) 10/29/2020 0348   AST 22 10/29/2020 0348   ALT 12 10/29/2020 0348   ALKPHOS 70 10/29/2020 0348   BILITOT 1.8 (H) 10/29/2020 0348   GFRNONAA 27 (L) 10/31/2020 0623   GFRAA 42 (L) 05/27/2020 1033   Lipase     Component Value Date/Time    LIPASE 10 (L) 10/28/2020 0047       Studies/Results: NM Hepatobiliary Liver Func  Result Date: 10/30/2020 CLINICAL DATA:  Recurring abdominal pain. EXAM: NUCLEAR MEDICINE HEPATOBILIARY IMAGING TECHNIQUE: Sequential images of the abdomen were obtained out to 60 minutes following intravenous administration of radiopharmaceutical. RADIOPHARMACEUTICALS:  5.3 mCi Tc-73m  Choletec IV COMPARISON:  Nuclear medicine hepatobiliary scan, dated September 28, 2020 FINDINGS: Prompt uptake and biliary excretion of activity by the liver is seen. Biliary activity passes into small bowel, consistent with patent common bile duct. The no tracer activity is identified within the gallbladder following the administration of 2.5 mg of IV morphine. IMPRESSION: Findings consistent with cystic duct obstruction/acute cholecystitis. Electronically Signed   By: Virgina Norfolk M.D.   On: 10/30/2020 19:18    Anti-infectives: Anti-infectives (From admission, onward)   Start     Dose/Rate Route Frequency Ordered Stop   10/29/20 1000  anidulafungin (ERAXIS) 100 mg in sodium chloride 0.9 % 100 mL IVPB  Status:  Discontinued       "Followed by" Linked Group Details   100 mg 78 mL/hr over 100 Minutes Intravenous Every 24 hours 10/28/20 0207 10/28/20 0240   10/28/20 2230  vancomycin (VANCOREADY) IVPB 1500 mg/300 mL        1,500 mg 150  mL/hr over 120 Minutes Intravenous  Once 10/28/20 2121 10/28/20 2347   10/28/20 2121  vancomycin variable dose per unstable renal function (pharmacist dosing)  Status:  Discontinued         Does not apply See admin instructions 10/28/20 2121 10/29/20 1133   10/28/20 1800  meropenem (MERREM) 1 g in sodium chloride 0.9 % 100 mL IVPB        1 g 200 mL/hr over 30 Minutes Intravenous Every 12 hours 10/28/20 0217     10/28/20 0300  anidulafungin (ERAXIS) 200 mg in sodium chloride 0.9 % 200 mL IVPB  Status:  Discontinued       "Followed by" Linked Group Details   200 mg 78 mL/hr over 200 Minutes  Intravenous Every 24 hours 10/28/20 0207 10/28/20 0240   10/28/20 0300  meropenem (MERREM) 1 g in sodium chloride 0.9 % 100 mL IVPB        1 g 200 mL/hr over 30 Minutes Intravenous  Once 10/28/20 0215 10/28/20 0440   10/18/2020 1915  ceFEPIme (MAXIPIME) 2 g in sodium chloride 0.9 % 100 mL IVPB  Status:  Discontinued        2 g 200 mL/hr over 30 Minutes Intravenous  Once 11/02/2020 1902 10/17/2020 1909   11/03/2020 1915  piperacillin-tazobactam (ZOSYN) IVPB 3.375 g  Status:  Discontinued        3.375 g 12.5 mL/hr over 240 Minutes Intravenous Once 10/31/2020 1902 11/07/2020 1909   10/31/2020 1915  meropenem (MERREM) 500 mg in sodium chloride 0.9 % 100 mL IVPB        500 mg 200 mL/hr over 30 Minutes Intravenous  Once 11/05/2020 1909 10/18/2020 2155       Assessment/Plan S/p lap appy 6 weeks ago T2DM CKD stage III Chronic R hip fracture  Septic shock - suspect gallbladder as source - WBC 18, afebrile - remains on levophed, poor UOP   ?Cirrhosis  Bilious ascites Acute Cholecystitis  - peritoneal fluid cxs pending - NGTD - no LFTs today, Tbili 1.8 12/19, LFTs were otherwise normal on 12/19 - HIDA suggestive of acute cholecystitis - would recommend IR consult for percutaneous cholecystostomy, I would expect cholecystostomy to likely be permanent in this patient   FEN: NPO, IVF VTE: SCDs, ok to have chemical DVT ppx from a surgical standpoint ID: Merrem 12/17>>  LOS: 4 days    Norm Parcel , [facility] 10/31/2020, 7:54 AM Please see Amion for pager number during day hours 7:00am-4:30pm

## 2020-10-31 NOTE — H&P (Signed)
Chief Complaint: Acute cholecystitis  Referring Physician(s): Nedra Hai  Supervising Physician: Suttle  Patient Status: [facility] - In-pt  History of Present Illness: Whitney [name] is a 85 y.o. female with medical issues including DM2, cirrhosis, CKD3, chronic R hip fracture, recent admission for emphysematous cystitis and pyelitis due to ESBL klebsiella.  She is known to our service. She underwent placement of a tunneled central venous catheter by Dr. Kathlene Cote on 08/23/20. It was removed by  Leone Payor on 08/31/20.  She was discharged 11/23 from admission for acute appendicitis s/p appendectomy 09/18/20.   Her course was complicated by recurrent abdominal pain.  She was found to have imaging suggestive of acute cholecystitis however interpretation difficult in the setting of her anasarca.  At that time General Surgery deemed her high risk for cholecystectomy due to frailty.  We felt she was not a candidate for a percutaneous cholecystostomy tube due to long term infectious complication in setting of her ascites.  On 11/10/2020 she developed sudden onset sharp abdominal pain prompting their visit to the ED.  CT showed = Moderate large volume of abdominopelvic ascites, some of the fluid is mildly complex. There may be some loculations in the right upper quadrant.  HIDA scan shwed= Findings consistent with cystic duct obstruction/acute cholecystitis.  We are asked to place a percutaneous cholecystostomy tube and perform a paracentesis.  Past Medical History:  Diagnosis Date  . CKD (chronic kidney disease)   . Complication of anesthesia    PER FAMILY PATIENT HAD A SEIZURE WHEN " THEY PUT A ROD IN HER LEG "  . Diabetes mellitus without complication [facility])     Past Surgical History:  Procedure Laterality Date  . APPENDECTOMY  09/18/2020  . CYSTOSCOPY W/ URETERAL STENT PLACEMENT Left 08/18/2020   Procedure: CYSTOSCOPY WITH RETROGRADE PYELOGRAM/URETERAL STENT PLACEMENT;   Surgeon: [name], MD;  Location: [place];  Service: Urology;  Laterality: Left;  . IR FLUORO GUIDE CV LINE RIGHT  08/23/2020  . IR REMOVAL TUN CV CATH W/O FL  08/31/2020  . IR US GUIDE VASC ACCESS RIGHT  08/23/2020  . LAPAROSCOPIC APPENDECTOMY N/A 09/18/2020   Procedure: APPENDECTOMY LAPAROSCOPIC;  Surgeon: Coralie Keens, MD;  Location: [place];  Service: General;  Laterality: N/A;  . TOTAL HIP ARTHROPLASTY Right 03/05/2020   Procedure: HIP GIRDLE STONE;  Surgeon: Leandrew Koyanagi, MD;  Location: [place];  Service: Orthopedics;  Laterality: Right;    Allergies: Succinylcholine and Milk-related compounds  Medications: Prior to Admission medications   Medication Sig Start Date End Date Taking? Authorizing Provider  acetaminophen (TYLENOL) 500 MG tablet Take 1,000 mg by mouth every 8 (eight) hours as needed for moderate pain or mild pain. 02/28/20  Yes [provider]  calcitonin, salmon, (MIACALCIN/FORTICAL) 200 UNIT/ACT nasal spray Place 1 spray into alternate nostrils daily. 03/30/20  Yes Vann, Jessica U, DO  furosemide (LASIX) 20 MG tablet Take 20 mg by mouth daily. 10/13/20  Yes [provider]  hydrocortisone cream 1 % Apply topically 2 (two) times daily. Patient taking differently: Apply 1 application topically 2 (two) times daily. 10/03/20  Yes Cato Mulligan, MD  ibuprofen (ADVIL) 200 MG tablet Take 200-400 mg by mouth daily as needed for headache (pain).   Yes [provider]  insulin glargine (LANTUS) 100 UNIT/ML Solostar Pen Inject 5 Units into the skin at bedtime. Patient taking differently: Inject 6 Units into the skin at bedtime. 04/12/20  Yes Argentina Donovan, PA-C  Multiple Vitamin (  MULTIVITAMIN WITH MINERALS) TABS tablet Take 1 tablet by mouth daily.   Yes [provider]  Nutritional Supplements (,FEEDING SUPPLEMENT, PROSOURCE PLUS) liquid Take 30 mLs by mouth 2 (two) times daily between meals. 10/03/20  Yes Cato Mulligan, MD   oxyCODONE-acetaminophen (PERCOCET) 5-325 MG tablet Take 1-2 tablets by mouth every 8 (eight) hours as needed for severe pain. Patient taking differently: Take 1 tablet by mouth every 8 (eight) hours as needed for severe pain. 03/05/20  Yes Leandrew Koyanagi, MD  Vitamin D, Ergocalciferol, (DRISDOL) 1.25 MG (50000 UNIT) CAPS capsule Take 1 capsule (50,000 Units total) by mouth every 7 (seven) days. Patient taking differently: Take 50,000 Units by mouth every Wednesday. 03/30/20  Yes Vann, Jessica U, DO  escitalopram (LEXAPRO) 10 MG tablet Take 10 mg by mouth daily. 10/20/20   [provider]  famotidine (PEPCID) 20 MG tablet Take 1 tablet (20 mg total) by mouth 2 (two) times daily. Patient not taking: No sig reported 03/30/20   Geradine Girt, DO  Insulin Pen Needle (PEN NEEDLES) 30G X 5 MM MISC Use as instructed. 04/26/20   Argentina Donovan, PA-C  tamsulosin (FLOMAX) 0.4 MG CAPS capsule Take 1 capsule (0.4 mg total) by mouth daily. Patient not taking: No sig reported 09/01/20   Virl Axe, MD     History reviewed. No pertinent family history.  Social History   Socioeconomic History  . Marital status: Widowed    Spouse name: Not on file  . Number of children: Not on file  . Years of education: Not on file  . Highest education level: Not on file  Occupational History  . Not on file  Tobacco Use  . Smoking status: Never Smoker  . Smokeless tobacco: Never Used  Vaping Use  . Vaping Use: Never used  Substance and Sexual Activity  . Alcohol use: Never  . Drug use: Never  . Sexual activity: Not on file  Other Topics Concern  . Not on file  Social History Narrative  . Not on file   Social Determinants of Health   Financial Resource Strain: Not on file  Food Insecurity: Not on file  Transportation Needs: Not on file  Physical Activity: Not on file  Stress: Not on file  Social Connections: Not on file     Review of Systems: A 12 point ROS discussed and pertinent  positives are indicated in the HPI above.  All other systems are negative.  Review of Systems  Vital Signs: BP (!) 122/102   Pulse (!) 104   Temp (!) 96.7 F (35.9 C) (Axillary)   Resp 19   Wt 82.8 kg   SpO2 95%   BMI 32.34 kg/m   Physical Exam Vitals reviewed.  Constitutional:      Appearance: Normal appearance.  HENT:     Head: Normocephalic and atraumatic.  Eyes:     Extraocular Movements: Extraocular movements intact.  Cardiovascular:     Rate and Rhythm: Normal rate and regular rhythm.  Pulmonary:     Effort: Pulmonary effort is normal. No respiratory distress.     Breath sounds: Normal breath sounds.  Abdominal:     General: There is distension.     Palpations: Abdomen is soft.     Tenderness: There is abdominal tenderness.  Musculoskeletal:        General: Normal range of motion.  Skin:    General: Skin is warm and dry.  Neurological:     General: No focal deficit  present.     Mental Status: She is alert and oriented to person, place, and time.  Psychiatric:        Mood and Affect: Mood normal.        Behavior: Behavior normal.        Thought Content: Thought content normal.        Judgment: Judgment normal.     Imaging: CT ABDOMEN PELVIS WO CONTRAST  Result Date: 10/24/2020 CLINICAL DATA:  Abdominal pain and hypotension. Sudden onset of abdominal pain today. Appendectomy last month. EXAM: CT ABDOMEN AND PELVIS WITHOUT CONTRAST TECHNIQUE: Multidetector CT imaging of the abdomen and pelvis was performed following the standard protocol without IV contrast. COMPARISON:  Most recent CT 09/27/2020 FINDINGS: Lower chest: Small left pleural effusion improved from prior exam. Previous right pleural effusion is resolved. Chronic lung disease with interstitial coarsening. Mitral annulus calcifications. Decreased density of the blood pool consistent with anemia. Hepatobiliary: No focal hepatic abnormality on noncontrast exam. Suggestion of nodular hepatic contours.  Calcified gallstones within distended gallbladder. Common bile duct is not well-defined, no evidence of biliary dilatation. No choledocholithiasis. Pancreas: Near completely fatty replaced. No ductal dilatation or inflammation. Spleen: Normal in size.  No focal abnormality. Adrenals/Urinary Tract: Normal adrenal glands. There is a left nephroureteral stent in place. There is air in the ureter adjacent to the stent is well as in the left renal collecting system. No definite renal parenchymal air. 2.8 cm cyst in the mid left kidney. Renal parenchymal thinning of both kidneys. No hydronephrosis. There is mild right perinephric edema. No evidence of renal calculi. Air-fluid level in the bladder which is otherwise decompressed. Stomach/Bowel: Bowel evaluation is limited in the absence of enteric contrast and presence of intra-abdominal ascites. No evidence of gastric wall thickening. No small bowel distension. Equivocal small bowel wall thickening involving bowel loops in the left abdomen versus reactive due to adjacent ascites, for example series 3, image 52. Majority of the colon is decompressed. Appendectomy. Vascular/Lymphatic: Aortic atherosclerosis. No aortic aneurysm. Infrarenal IVC filter in place. Left lateral strut abuts the aortic wall, unchanged from prior exam. Limited assessment for adenopathy given ascites. Reproductive: Uterine calcifications are likely vascular. Left adnexa obscured by streak artifact from left hip arthroplasty. No evidence of adnexal mass. Other: Moderate large volume of abdominopelvic ascites, some of the fluid is mildly complex. There may be some loculations in the right upper quadrant. No internal air or focal fluid collection. Generalized subcutaneous edema consistent with third spacing. Musculoskeletal: Chronic L1 compression fracture. Osteopenia/osteoporosis. Prior right hip Girdlestone procedure. Left hip arthroplasty. IMPRESSION: 1. Moderate large volume of abdominopelvic  ascites, some of the fluid is mildly complex. There may be some loculations in the right upper quadrant. No internal air or focal fluid collection. 2. Left nephroureteral stent in place. Air in the left ureter and left renal collecting system, likely related to instrumentation, possibility of emphysematous pyelitis is also considered. No renal parenchymal air or hydronephrosis. Air-fluid level in the bladder which is otherwise decompressed. 3. Equivocal small bowel wall thickening involving bowel loops in the left abdomen versus reactive due to adjacent ascites. 4. Suggestion of nodular hepatic contours, suspicious for cirrhosis. 5. Cholelithiasis. 6. Small left pleural effusion, improved from prior exam. Previous right pleural effusion is resolved. 7. Additional chronic findings as described. Aortic Atherosclerosis (ICD10-I70.0). Electronically Signed   By: Keith Rake M.D.   On: 11/10/2020 21:34   NM Hepatobiliary Liver Func  Result Date: 10/30/2020 CLINICAL DATA:  Recurring abdominal pain. EXAM:  NUCLEAR MEDICINE HEPATOBILIARY IMAGING TECHNIQUE: Sequential images of the abdomen were obtained out to 60 minutes following intravenous administration of radiopharmaceutical. RADIOPHARMACEUTICALS:  5.3 mCi Tc-75m  Choletec IV COMPARISON:  Nuclear medicine hepatobiliary scan, dated September 28, 2020 FINDINGS: Prompt uptake and biliary excretion of activity by the liver is seen. Biliary activity passes into small bowel, consistent with patent common bile duct. The no tracer activity is identified within the gallbladder following the administration of 2.5 mg of IV morphine. IMPRESSION: Findings consistent with cystic duct obstruction/acute cholecystitis. Electronically Signed   By: Virgina Norfolk M.D.   On: 10/30/2020 19:18   DG Chest Port 1 View  Result Date: 10/22/2020 CLINICAL DATA:  Concern for sepsis, evaluate for abnormality EXAM: PORTABLE CHEST 1 VIEW COMPARISON:  Radiograph 09/21/2020, CT abdomen  pelvis 09/20/2020 for evaluation bases FINDINGS: There are bilateral coarse interstitial opacities within the mid to lower lungs, similar to comparison exams albeit with some increasingly coalescent opacity along right heart border and left lung base. Suspect at least small left pleural effusion as well. No pneumothorax. The aorta is calcified. The remaining cardiomediastinal contours are unremarkable. The osseous structures appear diffusely demineralized which may limit detection of small or nondisplaced fractures. Remote posttraumatic deformity of the proximal left humerus is similar to comparison as well. No acute osseous abnormalities. Mild edematous changes in the soft tissues. IMPRESSION: 1. Bilateral coarse interstitial opacities within the mid to lower lungs, similar to comparison exams albeit with some increasingly coalescent opacity along the right heart border and left lung base, could reflect developing infection or edema on a background of chronic interstitial disease and scarring. 2. Suspect at least small left pleural effusion. 3. Remote chronic deformity of the left proximal humerus. Electronically Signed   By: Lovena Le M.D.   On: 10/21/2020 19:53    Labs:  CBC: Recent Labs    10/28/20 0047 10/29/20 0348 10/30/20 0501 10/31/20 0623  WBC 18.3* 20.1* 20.2* 18.3*  HGB 9.7* 8.1* 8.5* 8.4*  HCT 32.2* 26.3* 27.0* 26.6*  PLT PLATELET CLUMPS NOTED ON SMEAR, UNABLE TO ESTIMATE 177 169 153    COAGS: Recent Labs    03/02/20 2117 09/19/20 1201 10/30/2020 1814  INR 1.2 1.5* 1.5*  APTT  --   --  32    BMP: Recent Labs    03/19/20 0508 03/23/20 1137 03/26/20 0704 05/27/20 1033 08/17/20 1020 10/29/20 0348 10/29/20 0843 10/30/20 0501 10/31/20 0623  NA  --  143  --  138   < > 139 142 141 142  K  --  4.6  --  4.2   < > 6.2* 4.1 3.9 3.8  CL  --  114*  --  107   < > 107 107 108 108  CO2  --  23  --  25   < > 22 23 20* 21*  GLUCOSE  --  139*  --  269*   < > 176* 157* 158* 167*   BUN  --  26*  --  37*   < > 37* 39* 38* 38*  CALCIUM  --  7.3*  --  8.3*   < > 7.4* 7.8* 7.8* 7.9*  CREATININE 1.47* 1.34* 1.28* 1.35*   < > 1.84* 1.93* 1.90* 1.85*  GFRNONAA 33* 37* 39* 36*   < > 27* 25* 26* 27*  GFRAA 38* 43* 45* 42*  --   --   --   --   --    < > = values in this  interval not displayed.    LIVER FUNCTION TESTS: Recent Labs    10/02/20 0248 10/03/20 0518 10/14/2020 1814 10/29/20 0348  BILITOT 0.9 1.4* 1.1 1.8*  AST 22 23 28 22   ALT 9 9 12 12   ALKPHOS 181* 186* 88 70  PROT 5.5* 6.0* 6.3* 5.3*  ALBUMIN 1.4* 1.5* 1.2* 1.5*    TUMOR MARKERS: No results for input(s): AFPTM, CEA, CA199, CHROMGRNA in the last 8760 hours.  Assessment and Plan:  Findings consistent with cystic duct obstruction/acute cholecystitis.  Ascites  Will proceed with image guided placement of a percutaneous cholecystostomy tube and a paracentesis today by Dr.Suttle  Risks and benefits of paracentesis and percutaneous chole tube placement were discussed with the patient and the patient's daughter including, but not limited to bleeding, infection, gallbladder perforation, bile leak, damage to adjacent structures, sepsis or even death.  All of the patient's questions were answered, patient is agreeable to proceed. Consent signed and in chart.  Thank you for this interesting consult.  I greatly enjoyed meeting Whitney [name] and look forward to participating in their care.  A copy of this report was sent to the requesting provider on this date.  Electronically Signed: Murrell Redden, PA-C   10/31/2020, 9:59 AM      I spent a total of    25 Minutes in face to face in clinical consultation, greater than 50% of which was counseling/coordinating care for perc chole and paracentesis.

## 2020-10-31 NOTE — Procedures (Signed)
Interventional Radiology Procedure Note  Procedure:  1) Ultrasound guided paracentesis 2) Ultrasound and fluoroscopic guided cholecystostomy tube placement  Findings: Please refer to procedural dictation for full description.  10.2 intercostal, transheptic cholecystostomy placement yielding purulent output from gallbladder.  Sample sent for culture.  Paracentesis performed for therapeutic purposes and to decrease perihepatic ascites.  3.5 L translucent, straw colored fluid removed - not totally evacuated.  Complications: None immediate  Estimated Blood Loss: < 5 mL  Recommendations: Keep cholecystostomy to bag drainage.  Recommend repeat paracentesis within 24-48 hrs to evacuate as much ascites as possible to decrease the risk of peritonitis given presence of cholecystostomy. Agree with continuing broad spectrum antibiotics. Follow up bile cultures. IR will follow.   Ruthann Cancer, MD

## 2020-10-31 NOTE — Plan of Care (Signed)

## 2020-10-31 NOTE — Progress Notes (Signed)
Pt taken down to IR for procedure. SWAT RN to accompany pt. Daughter has just arrived and is going with pt to IR to help keep pt calm and communicate if needed.

## 2020-10-31 NOTE — Progress Notes (Signed)
NAMEValen [name], MRN:  [id number], DOB:  Oct 05, 1935, LOS: 4 ADMISSION DATE:  10/21/2020, CONSULTATION DATE:  10/16/2020 REFERRING MD:  Reather Converse, CHIEF COMPLAINT:  Abdominal pain  Brief History   85yF with septic and hypovolemic shock with likely intraabdominal source  History of present illness   85yF with DM2, cirrhosis, CKD3, chronic R hip fracture, recent admission for emphysematous cystitis and pyelitis due to ESBL klebsiella, last discharged 11/23 from admission for acute appendicitis s/p appendectomy 09/18/20. Her course was complicated by recurrent abdominal pain, found to have imaging suggestive of acute cholecystitis although interpretation challengign in setting of her anasarca - she was not felt at the time to be candidate for c-tube (IR concerned at the time for long term infectious complication in setting of her ascites) or surgery due to frailty. When she was discharged her daughter says that she barely ate or drank anything. This evening she developed sudden onset sharp lower abdominal pain prompting their visit to the ED. She's had no nausea, vomiting, diarrhea, hematochezia/melena, fever.  In ED she was given 2L IVF, meropenem, started on levophed through a peripheral IV  Past Medical History  DM2 Cirrhosis [facility] Events   11/04/2020 admitted  Consults:  Urology General surgery  Procedures:  Paracentesis 12/17 CBC 12/17 >> Arterial line 12/17 >>   Significant Diagnostic Tests:  CT A/P 12/17 with rapid accumulation of complex appearing ascites, emphysematous pyelo which may have acute component, bowel wall thickening, cholelithiasis  Micro Data:  12/17 BCx >> staph epi on BCD and 1 of 1 culture >  12/18 BCx >> neg Peritoneal fluid 12/17 >> neutrophils, no organisms seen >>  Urine 12/18 >> multiple  Antimicrobials:  12/17 meropenem >>   Interim history/subjective:  12/21: remains positive for hospital stay and additionally on levo.     Objective   Blood pressure (!) 120/55, pulse (!) 104, temperature (!) 96.7 F (35.9 C), temperature source Axillary, resp. rate 15, weight 82.8 kg, SpO2 95 %.        Intake/Output Summary (Last 24 hours) at 10/31/2020 1011 Last data filed at 10/31/2020 1000 Gross per 24 hour  Intake 2073.22 ml  Output 435 ml  Net 1638.22 ml   Filed Weights   10/28/20 0319 10/30/20 0500 10/31/20 0403  Weight: 73 kg 77 kg 82.8 kg    Examination: General: Chronically ill appearing elderly female lying in bed in NAD HEENT: [place]/AT, MM pink/moist, PERRL,  Neuro: Alert and interactive, language barrier in place CV: s1s2 regular rate and rhythm, no murmur, rubs, or gallops,  PULM: Bilateral crackles but remains on room air with oxygen saturations 98% and no signs of respiratory distress GI: soft, bowel sounds active in all 4 quadrants, non-tender, non-distended Extremities: warm/dry, 3+ pitting edema tracking up into the lower abdomen edema  Skin: no rashes or lesions  Resolved Hospital Problem list   Lactic acidosis  Assessment & Plan:   Shock  -Presumed septic with intraabdominal source (?secondary peritonitis) vs GU source and hypovolemic.  Significance of staph epi bacteremia 1 of 1 unclear -Empiric vancomycin was stopped 12/19.   -No clear role for any invasive procedure at this time.  Repeat HIDA scan recommended to assess for possible gallbladder contribution.  Question whether we may need to repeat CT abdomen going forward after treating with appropriate antibiotics P: Follow culture data  Continue empiric meropenem  given history of ESBL Klebsiella  Remains on stress dose steroids  Titrate pressors for map >65 Stop  ivf, add albumin with 1x dose lasix see if beneficial -certainly would benefit from volume removal but challenging with renal insuff and hypotension on pressors  Acute cholecystitis:  -await IR placement of perc chole  Ascites -Suspect secondary peritonitis, note fluid  creatinine 2.0 (same as serum creatinine).  P: Continue antibiotics as above  Urology consulted with no acute GU interventions recommended   AKI on CKD.  -Creatinine 09/2020 1.4-1.6, Creatinine 2.0 on admission  P: Follow renal function / urine output (marginal) Trend Bmet Avoid nephrotoxins Ensure adequate renal perfusion  Renally dose medications  Stopping IVF -dosing albumin/lasix x1 and monitor response  Goals of care:  Full code per daughter with all aggressive care desired. Daughter has stated that she does not want to meet with palliative care services   Best practice:  Diet: npo for procedure Pain/Anxiety/Delirium protocol (if indicated): no VAP protocol (if indicated): no DVT prophylaxis: hold till procedures this evening completed GI prophylaxis: not indicated Glucose control: SSI Mobility: bed level Code Status: Full Family Communication: pending Disposition: ICU until off levo  Labs   CBC: Recent Labs  Lab 10/30/2020 1814 10/15/2020 1846 10/28/20 0047 10/29/20 0348 10/30/20 0501 10/31/20 0623  WBC 9.4  --  18.3* 20.1* 20.2* 18.3*  NEUTROABS 8.7*  --   --   --   --   --   HGB 10.4* 11.6* 9.7* 8.1* 8.5* 8.4*  HCT 35.4* 34.0* 32.2* 26.3* 27.0* 26.6*  MCV 97.3  --  94.2 92.3 91.5 90.8  PLT 250  --  PLATELET CLUMPS NOTED ON SMEAR, UNABLE TO ESTIMATE 177 169 387    Basic Metabolic Panel: Recent Labs  Lab 10/28/20 0047 10/28/20 1726 10/29/20 0348 10/29/20 0843 10/30/20 0501 10/31/20 0623  NA 138 140 139 142 141 142  K 3.3* 3.3* 6.2* 4.1 3.9 3.8  CL 104 105 107 107 108 108  CO2 20* 25 22 23  20* 21*  GLUCOSE 215* 171* 176* 157* 158* 167*  BUN 39* 37* 37* 39* 38* 38*  CREATININE 2.00* 1.92* 1.84* 1.93* 1.90* 1.85*  CALCIUM 7.4* 7.5* 7.4* 7.8* 7.8* 7.9*  MG 1.5*  --  2.0  --   --   --   PHOS 4.2  --   --   --   --   --    GFR: Estimated Creatinine Clearance: 23.5 mL/min (A) (by C-G formula based on SCr of 1.85 mg/dL (H)). Recent Labs  Lab  10/28/20 0047 10/28/20 0746 10/28/20 1726 10/29/20 0348 10/30/20 0501 10/31/20 0623  WBC 18.3*  --   --  20.1* 20.2* 18.3*  LATICACIDVEN 6.4* 3.7* 1.3 1.0  --   --     Liver Function Tests: Recent Labs  Lab 11/08/2020 1814 10/29/20 0348  AST 28 22  ALT 12 12  ALKPHOS 88 70  BILITOT 1.1 1.8*  PROT 6.3* 5.3*  ALBUMIN 1.2* 1.5*   Recent Labs  Lab 10/28/20 0047  LIPASE 10*   No results for input(s): AMMONIA in the last 168 hours.  ABG    Component Value Date/Time   TCO2 23 10/15/2020 1846     Coagulation Profile: Recent Labs  Lab 10/20/2020 1814  INR 1.5*    Cardiac Enzymes: No results for input(s): CKTOTAL, CKMB, CKMBINDEX, TROPONINI in the last 168 hours.  HbA1C: Hgb A1c MFr Bld  Date/Time Value Ref Range Status  08/18/2020 01:35 AM 7.8 (H) 4.8 - 5.6 % Final    Comment:    (NOTE) Pre diabetes:  5.7%-6.4%  Diabetes:              >6.4%  Glycemic control for   <7.0% adults with diabetes     CBG: Recent Labs  Lab 10/30/20 1129 10/30/20 2017 10/31/20 0005 10/31/20 0348 10/31/20 0751  GLUCAP 119* 136* 118* 129* 142*      Critical care time: The patient is critically ill with multiple organ systems failure and requires high complexity decision making for assessment and support, frequent evaluation and titration of therapies, application of advanced monitoring technologies and extensive interpretation of multiple databases.  Critical care time 37 mins. This represents my time independent of the NPs time taking care of the pt. This is excluding procedures.    [facility] 10/31/2020, 10:15 AM

## 2020-11-01 DIAGNOSIS — I959 Hypotension, unspecified: Secondary | ICD-10-CM | POA: Diagnosis not present

## 2020-11-01 DIAGNOSIS — A419 Sepsis, unspecified organism: Secondary | ICD-10-CM | POA: Diagnosis not present

## 2020-11-01 LAB — PROTIME-INR
INR: 1.4 — ABNORMAL HIGH (ref 0.8–1.2)
Prothrombin Time: 17 seconds — ABNORMAL HIGH (ref 11.4–15.2)

## 2020-11-01 LAB — BASIC METABOLIC PANEL
Anion gap: 15 (ref 5–15)
BUN: 41 mg/dL — ABNORMAL HIGH (ref 8–23)
CO2: 19 mmol/L — ABNORMAL LOW (ref 22–32)
Calcium: 7.8 mg/dL — ABNORMAL LOW (ref 8.9–10.3)
Chloride: 108 mmol/L (ref 98–111)
Creatinine, Ser: 2.04 mg/dL — ABNORMAL HIGH (ref 0.44–1.00)
GFR, Estimated: 24 mL/min — ABNORMAL LOW (ref >60)
Glucose, Bld: 171 mg/dL — ABNORMAL HIGH (ref 70–99)
Potassium: 3.6 mmol/L (ref 3.5–5.1)
Sodium: 142 mmol/L (ref 135–145)

## 2020-11-01 LAB — GLUCOSE, CAPILLARY
Glucose-Capillary: 130 mg/dL — ABNORMAL HIGH (ref 70–99)
Glucose-Capillary: 133 mg/dL — ABNORMAL HIGH (ref 70–99)
Glucose-Capillary: 139 mg/dL — ABNORMAL HIGH (ref 70–99)
Glucose-Capillary: 141 mg/dL — ABNORMAL HIGH (ref 70–99)
Glucose-Capillary: 144 mg/dL — ABNORMAL HIGH (ref 70–99)
Glucose-Capillary: 149 mg/dL — ABNORMAL HIGH (ref 70–99)

## 2020-11-01 LAB — CBC
HCT: 26.9 % — ABNORMAL LOW (ref 36.0–46.0)
Hemoglobin: 8.3 g/dL — ABNORMAL LOW (ref 12.0–15.0)
MCH: 28.1 pg (ref 26.0–34.0)
MCHC: 30.9 g/dL (ref 30.0–36.0)
MCV: 91.2 fL (ref 80.0–100.0)
Platelets: 153 10*3/uL (ref 150–400)
RBC: 2.95 MIL/uL — ABNORMAL LOW (ref 3.87–5.11)
RDW: 19.1 % — ABNORMAL HIGH (ref 11.5–15.5)
WBC: 11.8 10*3/uL — ABNORMAL HIGH (ref 4.0–10.5)
nRBC: 0 % (ref 0.0–0.2)

## 2020-11-01 MED ORDER — ALBUMIN HUMAN 25 % IV SOLN: 25.0000 g | Freq: Once | INTRAVENOUS | Status: DC

## 2020-11-01 MED ORDER — MIDODRINE HCL 5 MG PO TABS
10.0000 mg | ORAL_TABLET | Freq: Three times a day (TID) | ORAL | Status: DC
  Administered 2020-11-01 – 2020-11-15 (×37): 10 mg via ORAL
  Filled 2020-11-01 (×41): qty 2

## 2020-11-01 MED ORDER — MIDODRINE HCL 5 MG PO TABS
5.0000 mg | ORAL_TABLET | Freq: Three times a day (TID) | ORAL | Status: DC
  Administered 2020-11-01: 12:00:00 5 mg via ORAL
  Filled 2020-11-01: qty 1

## 2020-11-01 MED ORDER — ALBUMIN HUMAN 25 % IV SOLN
50.0000 g | Freq: Once | INTRAVENOUS | Status: AC
  Administered 2020-11-01: 17:00:00 50 g via INTRAVENOUS
  Filled 2020-11-01: qty 200

## 2020-11-01 NOTE — Progress Notes (Signed)
Discussed with daughter, Milta Deiters, regarding current clinical course and result of percutaneous cholecystostomy by IR yesterday. Discussed antibiotic therapy and need for cholecystostomy to remain long term. Ms.[name] expressed understanding. All other questions and concerns addressed.

## 2020-11-01 NOTE — Progress Notes (Signed)
Referring Physician(s): Dr Nydia Bouton  Supervising Physician: Sandi Mariscal  Patient Status:  [facility] - In-pt  Chief Complaint:  Cholecystitis  Subjective:  12/21 IR procedure IMPRESSION: 1. Technically successful ultrasound-guided paracentesis from the left lower quadrant yielding approximately 3.5 L of translucent, straw-colored fluid. 2. Technically successful 10.2 French cholecystostomy tube placement via an intercostal, transhepatic route.  Pt resting Seems in NAD No pain OP of drain is bile and copious   Allergies: Succinylcholine and Milk-related compounds  Medications: Prior to Admission medications   Medication Sig Start Date End Date Taking? Authorizing Provider  acetaminophen (TYLENOL) 500 MG tablet Take 1,000 mg by mouth every 8 (eight) hours as needed for moderate pain or mild pain. 02/28/20  Yes [provider]  calcitonin, salmon, (MIACALCIN/FORTICAL) 200 UNIT/ACT nasal spray Place 1 spray into alternate nostrils daily. 03/30/20  Yes Vann, Jessica U, DO  furosemide (LASIX) 20 MG tablet Take 20 mg by mouth daily. 10/13/20  Yes [provider]  hydrocortisone cream 1 % Apply topically 2 (two) times daily. Patient taking differently: Apply 1 application topically 2 (two) times daily. 10/03/20  Yes Cato Mulligan, MD  ibuprofen (ADVIL) 200 MG tablet Take 200-400 mg by mouth daily as needed for headache (pain).   Yes [provider]  insulin glargine (LANTUS) 100 UNIT/ML Solostar Pen Inject 5 Units into the skin at bedtime. Patient taking differently: Inject 6 Units into the skin at bedtime. 04/12/20  Yes Argentina Donovan, PA-C  Multiple Vitamin (MULTIVITAMIN WITH MINERALS) TABS tablet Take 1 tablet by mouth daily.   Yes [provider]  Nutritional Supplements (,FEEDING SUPPLEMENT, PROSOURCE PLUS) liquid Take 30 mLs by mouth 2 (two) times daily between meals. 10/03/20  Yes Cato Mulligan, MD  oxyCODONE-acetaminophen (PERCOCET)  5-325 MG tablet Take 1-2 tablets by mouth every 8 (eight) hours as needed for severe pain. Patient taking differently: Take 1 tablet by mouth every 8 (eight) hours as needed for severe pain. 03/05/20  Yes Leandrew Koyanagi, MD  Vitamin D, Ergocalciferol, (DRISDOL) 1.25 MG (50000 UNIT) CAPS capsule Take 1 capsule (50,000 Units total) by mouth every 7 (seven) days. Patient taking differently: Take 50,000 Units by mouth every Wednesday. 03/30/20  Yes Vann, Jessica U, DO  escitalopram (LEXAPRO) 10 MG tablet Take 10 mg by mouth daily. 10/20/20   [provider]  famotidine (PEPCID) 20 MG tablet Take 1 tablet (20 mg total) by mouth 2 (two) times daily. Patient not taking: No sig reported 03/30/20   Geradine Girt, DO  Insulin Pen Needle (PEN NEEDLES) 30G X 5 MM MISC Use as instructed. 04/26/20   Argentina Donovan, PA-C  tamsulosin (FLOMAX) 0.4 MG CAPS capsule Take 1 capsule (0.4 mg total) by mouth daily. Patient not taking: No sig reported 09/01/20   Virl Axe, MD     Vital Signs: BP (!) 113/58   Pulse 80   Temp (!) 96.2 F (35.7 C) (Axillary) Comment: warm blankets applied  Resp 17   Wt 171 lb 8.3 oz (77.8 kg)   SpO2 99%   BMI 30.38 kg/m   Physical Exam Skin:    Comments: Culture FEW KLEBSIELLA PNEUMONIAE    Skin site is c/d/i Sl tender No bleeding OP bile      Imaging: NM Hepatobiliary Liver Func  Result Date: 10/30/2020 CLINICAL DATA:  Recurring abdominal pain. EXAM: NUCLEAR MEDICINE HEPATOBILIARY IMAGING TECHNIQUE: Sequential images of the abdomen were obtained out to 60 minutes following intravenous administration of radiopharmaceutical. RADIOPHARMACEUTICALS:  5.3 mCi Tc-65m  Choletec IV COMPARISON:  Nuclear medicine hepatobiliary scan, dated September 28, 2020 FINDINGS: Prompt uptake and biliary excretion of activity by the liver is seen. Biliary activity passes into small bowel, consistent with patent common bile duct. The no tracer activity is identified within the  gallbladder following the administration of 2.5 mg of IV morphine. IMPRESSION: Findings consistent with cystic duct obstruction/acute cholecystitis. Electronically Signed   By: Virgina Norfolk M.D.   On: 10/30/2020 19:18   IR Perc Cholecystostomy  Result Date: 11/01/2020 INDICATION: 85 year old female with acute, calculus cholecystitis, poor operative candidate due to cirrhosis and portal hypertension with ascites. EXAM: 1. Ultrasound-guided paracentesis 2. Ultrasound and fluoroscopic guided placement of cholecystostomy tube MEDICATIONS: Patient was receiving intravenous antibiotics as an inpatient which were continued throughout the procedure. ANESTHESIA/SEDATION: Moderate (conscious) sedation was employed during this procedure. A total of Versed 1 mg and Fentanyl 50 mcg was administered intravenously. Moderate Sedation Time: 21 minutes. The patient's level of consciousness and vital signs were monitored continuously by radiology nursing throughout the procedure under my direct supervision. FLUOROSCOPY TIME:  Fluoroscopy Time: 1 minutes 48 seconds (8 mGy). COMPLICATIONS: None immediate. PROCEDURE: Informed written consent was obtained from the patient after a thorough discussion of the procedural risks, benefits and alternatives. All questions were addressed. Maximal Sterile Barrier Technique was utilized including caps, mask, sterile gowns, sterile gloves, sterile drape, hand hygiene and skin antiseptic. A timeout was performed prior to the initiation of the procedure. The abdomen was prepped and draped in standard fashion. Preprocedure ultrasound demonstrated a large volume ascites, with the largest pocket in the left lower quadrant. Subdermal Local anesthesia was administered 1% lidocaine in the left lower quadrant at the planned paracentesis needle entry site. A small skin nick was made. Deeper local anesthesia along the peritoneum was administered. A 6 French Safe-T-Centesis catheter was then inserted  and there was immediate return of translucent, straw-colored ascitic fluid. A total of 3.5 L ascites was drained during the duration of the procedure. There was a moderate amount of residual ascites at this time. Given the patient's hypotension requiring vasopressor support, additional drainage was not pursued. The distended gallbladder was identified in the right upper quadrant which demonstrated multiple echogenic gallstones and a thickened wall. Subdermal Local anesthesia was administered at the planned needle entry site. A small skin nick was made. Deeper local anesthetic was administered along the peritoneal lining. Under direct ultrasound visualization, a 22 gauge Chiba needle was inserted via an intercostal, transhepatic route to the gallbladder. The inner stylet was removed and limited hand injection of contrast demonstrated position within the gallbladder. A Mandril wire was inserted through the needle which was coiled within the gallbladder. Intraluminal position was confirmed with ultrasound. A 6 French Accustick introducer set was then placed over the wire into the gallbladder and the inner stylet and wire were removed. Aspiration of approximately 100 mL of purulence fluid was performed. A sample was collected and sent to the lab for culture. An Amplatz wire was then coiled within the gallbladder lumen. Serial dilation was performed and a 10.2 [place] multipurpose drainage catheter was placed into the gallbladder. Hand injection of contrast demonstrated adequate position with the pigtail portion coiled in the gallbladder infundibulum. Multiple filling defects were noted compatible with known gallstones. The catheter was sutured at the skin entry site with a 0 silk retention suture. A Stay Fix bandage was applied. The patient tolerated the procedure well without complication and was returned to the ICU in stable  condition. IMPRESSION: 1. Technically successful ultrasound-guided paracentesis from the left  lower quadrant yielding approximately 3.5 L of translucent, straw-colored fluid. 2. Technically successful 10.2 French cholecystostomy tube placement via an intercostal, transhepatic route. PLAN: 1. Recommend repeat paracentesis in 24-48 hours for further evacuation of residual ascites. 2. Keep cholecystostomy tube to bag drainage. Return on outpatient basis in 6-8 weeks for routine cholecystostomy tube check and exchange. Ruthann Cancer, MD [facility] Electronically Signed   By: Ruthann Cancer MD   On: 11/01/2020 07:44    Labs:  CBC: Recent Labs    10/29/20 0348 10/30/20 0501 10/31/20 0623 11/01/20 0410  WBC 20.1* 20.2* 18.3* 11.8*  HGB 8.1* 8.5* 8.4* 8.3*  HCT 26.3* 27.0* 26.6* 26.9*  PLT 177 169 153 153    COAGS: Recent Labs    03/02/20 2117 09/19/20 1201 10/19/2020 1814 11/01/20 0410  INR 1.2 1.5* 1.5* 1.4*  APTT  --   --  32  --     BMP: Recent Labs    03/19/20 0508 03/23/20 1137 03/26/20 0704 05/27/20 1033 08/17/20 1020 10/29/20 0843 10/30/20 0501 10/31/20 0623 11/01/20 0410  NA  --  143  --  138   < > 142 141 142 142  K  --  4.6  --  4.2   < > 4.1 3.9 3.8 3.6  CL  --  114*  --  107   < > 107 108 108 108  CO2  --  23  --  25   < > 23 20* 21* 19*  GLUCOSE  --  139*  --  269*   < > 157* 158* 167* 171*  BUN  --  26*  --  37*   < > 39* 38* 38* 41*  CALCIUM  --  7.3*  --  8.3*   < > 7.8* 7.8* 7.9* 7.8*  CREATININE 1.47* 1.34* 1.28* 1.35*   < > 1.93* 1.90* 1.85* 2.04*  GFRNONAA 33* 37* 39* 36*   < > 25* 26* 27* 24*  GFRAA 38* 43* 45* 42*  --   --   --   --   --    < > = values in this interval not displayed.    LIVER FUNCTION TESTS: Recent Labs    10/02/20 0248 10/03/20 0518 10/18/2020 1814 10/29/20 0348  BILITOT 0.9 1.4* 1.1 1.8*  AST 22 23 28 22   ALT 9 9 12 12   ALKPHOS 181* 186* 88 70  PROT 5.5* 6.0* 6.3* 5.3*  ALBUMIN 1.4* 1.5* 1.2* 1.5*    Assessment and Plan:  Perc chole drain in  place Will follow Will keep in 8 weeks   Electronically Signed: Lavonia Drafts, PA-C 11/01/2020, 2:48 PM   I spent a total of 15 Minutes at the the patient's bedside AND on the patient's hospital floor or unit, greater than 50% of which was counseling/coordinating care for perc chole drain

## 2020-11-01 NOTE — Progress Notes (Signed)
Whitney [name], MRN:  [id number], DOB:  04-08-1935, LOS: 5 ADMISSION DATE:  10/17/2020, CONSULTATION DATE:  11/03/2020 REFERRING MD:  Whitney [name], CHIEF COMPLAINT:  Abdominal pain  Brief History   85yF with septic and hypovolemic shock with likely intraabdominal source  History of present illness   85yF with DM2, cirrhosis, CKD3, chronic R hip fracture, recent admission for emphysematous cystitis and pyelitis due to ESBL klebsiella, last discharged 11/23 from admission for acute appendicitis s/p appendectomy 09/18/20. Her course was complicated by recurrent abdominal pain, found to have imaging suggestive of acute cholecystitis although interpretation challengign in setting of her anasarca - she was not felt at the time to be candidate for c-tube (IR concerned at the time for long term infectious complication in setting of her ascites) or surgery due to frailty. When she was discharged her daughter says that she barely ate or drank anything. This evening she developed sudden onset sharp lower abdominal pain prompting their visit to the ED. She's had no nausea, vomiting, diarrhea, hematochezia/melena, fever.  In ED she was given 2L IVF, meropenem, started on levophed through a peripheral IV  Past Medical History  DM2 Cirrhosis Whitney [name] Events   11/02/2020 admitted  Consults:  Urology General surgery  Procedures:  Paracentesis 12/17 CBC 12/17 >> Arterial line 12/17 >> 12/21  Significant Diagnostic Tests:  CT A/P 12/17 with rapid accumulation of complex appearing ascites, emphysematous pyelo which may have acute component, bowel wall thickening, cholelithiasis  Micro Data:  12/17 BCx >> staph epi on BCD and 1 of 1 culture >  12/18 BCx >> neg Peritoneal fluid 12/17 >> neutrophils, no organisms seen >>  Urine 12/18 >> multiple 12/21 gall bladder aspirate >> gram negative rods / gram positive cocci  Antimicrobials:  12/17 meropenem >>   Interim history/subjective:   O/N event: No acute event  Whitney [name] was examined and evaluated at bedside. She was noted to be agitated and yelling no to physical exam. Attempted to use interpreter but pulled her sheets over her head and declined interview.  Objective   Blood pressure (!) 106/46, pulse 81, temperature (!) 96.2 F (35.7 C), temperature source Axillary, resp. rate 20, weight 77.8 kg, SpO2 100 %.        Intake/Output Summary (Last 24 hours) at 11/01/2020 0835 Last data filed at 11/01/2020 0700 Gross per 24 hour  Intake 721.31 ml  Output 839 ml  Net -117.69 ml   Filed Weights   10/30/20 0500 10/31/20 0403 11/01/20 0630  Weight: 77 kg 82.8 kg 77.8 kg    Examination: Gen: Well-developed, chronically ill-appearing,  HEENT: NCAT head, hearing intact, EOMI Neck: supple, ROM intact, no JVD CV: RRR, S1, S2 normal, No rubs, no murmurs, no gallops Pulm: Breathing comfortably without distress, bibasilar rales, no wheezes Abd: Distended with abdominal edema, soft, cholecystostomy tube in place Extm: ROM intact, Peripheral pulses intact, 3+ pitting edema up to lower abdomen Skin: Dry, Warm, no jaundice  Neuro: Agitated, unable to assess orientation  Resolved [name] Problem list   Lactic acidosis  Assessment & Plan:   Septic Shock Cholecystitis HIDA scan with acute cholecystitis. IR placement of percutaneous cholecystostomy yesterday. Bile culture growing gram neg rod and gram positive cocci. On meropenem day 5. Afebrile overnight. Wbc 18.3->11.8. Currently on 59mcg of levophed. Given albumin with lasix with good urine output yesterday P: - F/u drain culture - C/w meropenem (day 5), narrow based on cultures - Wean off pressors - Titrate down stress dose steroids -  Per IR, paracentesis within 24-48 hours  Ascites -Due to decompensated cirrhosis +/- peritonitis, note fluid creatinine 2.0 (same as serum creatinine).  P: Continue antibiotics as above  IR to repeat paracentesis Urology consulted  with no acute GU interventions recommended   AKI on CKD.  -Creatinine 09/2020 1.4-1.6, Creatinine 2.0->1.85->2.04. Given albumin with lasix yesterday P: Follow renal function / urine output (marginal) Trend Bmet Avoid nephrotoxins Ensure adequate renal perfusion  Renally dose medications  If sbp confirmed, need albumin, may be at new baseline  Goals of care:  Full code per daughter with all aggressive care desired. Daughter has stated that she does not want to meet with palliative care services  Best practice:  Diet: npo for procedure Pain/Anxiety/Delirium protocol (if indicated): no VAP protocol (if indicated): no DVT prophylaxis: hold till procedures this evening completed GI prophylaxis: not indicated Glucose control: SSI Mobility: bed level Code Status: Full Family Communication: pending Disposition: ICU until off levo  Labs   CBC: Recent Labs  Lab 10/15/2020 1814 10/24/2020 1846 10/28/20 0047 10/29/20 0348 10/30/20 0501 10/31/20 0623 11/01/20 0410  WBC 9.4  --  18.3* 20.1* 20.2* 18.3* 11.8*  NEUTROABS 8.7*  --   --   --   --   --   --   HGB 10.4*   < > 9.7* 8.1* 8.5* 8.4* 8.3*  HCT 35.4*   < > 32.2* 26.3* 27.0* 26.6* 26.9*  MCV 97.3  --  94.2 92.3 91.5 90.8 91.2  PLT 250  --  PLATELET CLUMPS NOTED ON SMEAR, UNABLE TO ESTIMATE 177 169 153 153   < > = values in this interval not displayed.    Basic Metabolic Panel: Recent Labs  Lab 10/28/20 0047 10/28/20 1726 10/29/20 0348 10/29/20 0843 10/30/20 0501 10/31/20 0623 11/01/20 0410  NA 138   < > 139 142 141 142 142  K 3.3*   < > 6.2* 4.1 3.9 3.8 3.6  CL 104   < > 107 107 108 108 108  CO2 20*   < > 22 23 20* 21* 19*  GLUCOSE 215*   < > 176* 157* 158* 167* 171*  BUN 39*   < > 37* 39* 38* 38* 41*  CREATININE 2.00*   < > 1.84* 1.93* 1.90* 1.85* 2.04*  CALCIUM 7.4*   < > 7.4* 7.8* 7.8* 7.9* 7.8*  MG 1.5*  --  2.0  --   --   --   --   PHOS 4.2  --   --   --   --   --   --    < > = values in this interval not  displayed.   GFR: Estimated Creatinine Clearance: 20.6 mL/min (A) (by C-G formula based on SCr of 2.04 mg/dL (H)). Recent Labs  Lab 10/28/20 0047 10/28/20 0746 10/28/20 1726 10/29/20 0348 10/30/20 0501 10/31/20 0623 11/01/20 0410  WBC 18.3*  --   --  20.1* 20.2* 18.3* 11.8*  LATICACIDVEN 6.4* 3.7* 1.3 1.0  --   --   --     Liver Function Tests: Recent Labs  Lab 10/30/2020 1814 10/29/20 0348  AST 28 22  ALT 12 12  ALKPHOS 88 70  BILITOT 1.1 1.8*  PROT 6.3* 5.3*  ALBUMIN 1.2* 1.5*   Recent Labs  Lab 10/28/20 0047  LIPASE 10*   No results for input(s): AMMONIA in the last 168 hours.  ABG    Component Value Date/Time   TCO2 23 10/28/2020 1846     Coagulation  Profile: Recent Labs  Lab 10/29/2020 1814 11/01/20 0410  INR 1.5* 1.4*    Cardiac Enzymes: No results for input(s): CKTOTAL, CKMB, CKMBINDEX, TROPONINI in the last 168 hours.  HbA1C: Hgb A1c MFr Bld  Date/Time Value Ref Range Status  08/18/2020 01:35 AM 7.8 (H) 4.8 - 5.6 % Final    Comment:    (NOTE) Pre diabetes:          5.7%-6.4%  Diabetes:              >6.4%  Glycemic control for   <7.0% adults with diabetes     CBG: Recent Labs  Lab 10/31/20 1525 10/31/20 2058 11/01/20 0012 11/01/20 0355 11/01/20 0759  GLUCAP 122* 158* 133* 149* 139*   Mosetta Anis, MD 11/01/2020, 8:50 AM PGY-3, [facility] Pager: [phone]   Critical care time: The patient is critically ill with multiple organ systems failure and requires high complexity decision making for assessment and support, frequent evaluation and titration of therapies, application of advanced monitoring technologies and extensive interpretation of multiple databases.  Critical care time

## 2020-11-01 NOTE — Discharge Instructions (Signed)
Cholecystostomy, Care After This sheet gives you information about how to care for yourself after your procedure. Your health care provider may also give you more specific instructions. If you have problems or questions, contact your health care provider. What can I expect after the procedure? After your procedure, it is common to have soreness near the incision site of your drainage tube (catheter). Follow these instructions at home: Incision care   Follow instructions from your health care provider about how to take care of your incision site where the catheter was inserted. Make sure you: ? Wash your hands with soap and water before and after you change your bandage (dressing). If soap and water are not available, use hand sanitizer. ? Change your dressing as told by your health care provider.  Check the incision site every day for signs of infection. Check for: ? Redness, swelling, or pain. ? Fluid or blood. ? Warmth. ? Pus or a bad smell.  Do not take baths, swim, or use a hot tub until your health care provider approves. Ask your health care provider if you may take showers. You may only be allowed to take sponge baths. General instructions  Follow instructions from your health care provider about how to care for your catheter and collection bag at home.  Your health care provider will show you: ? How to record the amount of drainage from the catheter. ? How to flush the catheter. ? How to care for the catheter incision site.  Follow instructions from your health care provider about eating or drinking restrictions.  Take over-the-counter and prescription medicines only as told by your health care provider.  Keep all follow-up visits as told by your health care provider. This is important. Contact a health care provider if:  You have redness, swelling, or pain around the catheter incision site.  You have nausea or vomiting. Get help right away if:  Your abdominal pain  gets worse.  You feel dizzy or you faint while standing.  You have fluid or blood coming from the catheter incision site.  The area around the catheter incision site feels warm to the touch.  You have pus or a bad smell coming from the catheter incision site.  You have a fever.  You have shortness of breath.  You have a rapid heartbeat.  Your nausea or vomiting does not go away.  Your catheter becomes blocked.  Your catheter comes out of your abdomen. Summary  After your procedure, it is common to have soreness near the incision site of your drainage tube (catheter).  Wash your hands with soap and water before and after you change your bandage (dressing). Change your dressing as told by your health care provider.  Check the catheter incision site every day for signs of infection. Check for redness, swelling, pain, fluid, blood, warmth, pus, or a bad smell.  Contact your health care provider if you have nausea or vomiting, or if you have redness, swelling, or pain around your catheter incision site.  Get help right away if your abdominal pain gets worse, you feel dizzy, you have blood or fluid coming from the catheter incision site, you have a fever, or you have shortness of breath. This information is not intended to replace advice given to you by your health care provider. Make sure you discuss any questions you have with your health care provider. Document Revised: 05/25/2018 Document Reviewed: 05/25/2018 Elsevier Patient Education  2020 [employer]  

## 2020-11-01 NOTE — Progress Notes (Addendum)
Progress Note     Subjective:  Patient less cooperative today and shouting no if I attempt to lower covers from her face. Seems to be tender on her right abdomen. Drain visible and has bilious fluid. No family present at bedside.   Objective: Vital signs in last 24 hours: Temp:  [96.2 F (35.7 C)-97.5 F (36.4 C)] 96.2 F (35.7 C) (12/22 0757) Pulse Rate:  [30-123] 81 (12/22 0645) Resp:  [12-31] 20 (12/22 0700) BP: (71-148)/(33-106) 106/46 (12/22 0700) SpO2:  [76 %-100 %] 100 % (12/22 0645) Weight:  [77.8 kg] 77.8 kg (12/22 0630) Last BM Date: 10/29/20  Intake/Output from previous day: 12/21 0701 - 12/22 0700 In: 721.3 [I.V.:521.5; IV Piggyback:199.8] Out: 849 [Urine:804; Drains:45] Intake/Output this shift: No intake/output data recorded.  PE: General:WD, morbidly obesefemale who is laying in bed and appears chronically ill, shouts no if I attempt to lower covers from face HEENT:sclera anicteric Heart: rate in the 80s.3+ pitting edema in BLE Lungs: CTAB, no wheezes, rhonchi, or rales noted. Respiratory effort nonlabored Abd: soft, ttp in RUQ, ND, +BS, drain in RUQ with bilious fluid   Lab Results:  Recent Labs    10/31/20 0623 11/01/20 0410  WBC 18.3* 11.8*  HGB 8.4* 8.3*  HCT 26.6* 26.9*  PLT 153 153   BMET Recent Labs    10/31/20 0623 11/01/20 0410  NA 142 142  K 3.8 3.6  CL 108 108  CO2 21* 19*  GLUCOSE 167* 171*  BUN 38* 41*  CREATININE 1.85* 2.04*  CALCIUM 7.9* 7.8*   PT/INR Recent Labs    11/01/20 0410  LABPROT 17.0*  INR 1.4*   CMP     Component Value Date/Time   NA 142 11/01/2020 0410   K 3.6 11/01/2020 0410   CL 108 11/01/2020 0410   CO2 19 (L) 11/01/2020 0410   GLUCOSE 171 (H) 11/01/2020 0410   BUN 41 (H) 11/01/2020 0410   CREATININE 2.04 (H) 11/01/2020 0410   CALCIUM 7.8 (L) 11/01/2020 0410   PROT 5.3 (L) 10/29/2020 0348   ALBUMIN 1.5 (L) 10/29/2020 0348   AST 22 10/29/2020 0348   ALT 12 10/29/2020 0348   ALKPHOS 70  10/29/2020 0348   BILITOT 1.8 (H) 10/29/2020 0348   GFRNONAA 24 (L) 11/01/2020 0410   GFRAA 42 (L) 05/27/2020 1033   Lipase     Component Value Date/Time   LIPASE 10 (L) 10/28/2020 0047       Studies/Results: NM Hepatobiliary Liver Func  Result Date: 10/30/2020 CLINICAL DATA:  Recurring abdominal pain. EXAM: NUCLEAR MEDICINE HEPATOBILIARY IMAGING TECHNIQUE: Sequential images of the abdomen were obtained out to 60 minutes following intravenous administration of radiopharmaceutical. RADIOPHARMACEUTICALS:  5.3 mCi Tc-49m  Choletec IV COMPARISON:  Nuclear medicine hepatobiliary scan, dated September 28, 2020 FINDINGS: Prompt uptake and biliary excretion of activity by the liver is seen. Biliary activity passes into small bowel, consistent with patent common bile duct. The no tracer activity is identified within the gallbladder following the administration of 2.5 mg of IV morphine. IMPRESSION: Findings consistent with cystic duct obstruction/acute cholecystitis. Electronically Signed   By: Virgina Norfolk M.D.   On: 10/30/2020 19:18   IR Perc Cholecystostomy  Result Date: 11/01/2020 INDICATION: 85 year old female with acute, calculus cholecystitis, poor operative candidate due to cirrhosis and portal hypertension with ascites. EXAM: 1. Ultrasound-guided paracentesis 2. Ultrasound and fluoroscopic guided placement of cholecystostomy tube MEDICATIONS: Patient was receiving intravenous antibiotics as an inpatient which were continued throughout the procedure. ANESTHESIA/SEDATION: Moderate (conscious)  sedation was employed during this procedure. A total of Versed 1 mg and Fentanyl 50 mcg was administered intravenously. Moderate Sedation Time: 21 minutes. The patient's level of consciousness and vital signs were monitored continuously by radiology nursing throughout the procedure under my direct supervision. FLUOROSCOPY TIME:  Fluoroscopy Time: 1 minutes 48 seconds (8 mGy). COMPLICATIONS: None  immediate. PROCEDURE: Informed written consent was obtained from the patient after a thorough discussion of the procedural risks, benefits and alternatives. All questions were addressed. Maximal Sterile Barrier Technique was utilized including caps, mask, sterile gowns, sterile gloves, sterile drape, hand hygiene and skin antiseptic. A timeout was performed prior to the initiation of the procedure. The abdomen was prepped and draped in standard fashion. Preprocedure ultrasound demonstrated a large volume ascites, with the largest pocket in the left lower quadrant. Subdermal Local anesthesia was administered 1% lidocaine in the left lower quadrant at the planned paracentesis needle entry site. A small skin nick was made. Deeper local anesthesia along the peritoneum was administered. A 6 French Safe-T-Centesis catheter was then inserted and there was immediate return of translucent, straw-colored ascitic fluid. A total of 3.5 L ascites was drained during the duration of the procedure. There was a moderate amount of residual ascites at this time. Given the patient's hypotension requiring vasopressor support, additional drainage was not pursued. The distended gallbladder was identified in the right upper quadrant which demonstrated multiple echogenic gallstones and a thickened wall. Subdermal Local anesthesia was administered at the planned needle entry site. A small skin nick was made. Deeper local anesthetic was administered along the peritoneal lining. Under direct ultrasound visualization, a 22 gauge Chiba needle was inserted via an intercostal, transhepatic route to the gallbladder. The inner stylet was removed and limited hand injection of contrast demonstrated position within the gallbladder. A Mandril wire was inserted through the needle which was coiled within the gallbladder. Intraluminal position was confirmed with ultrasound. A 6 French Accustick introducer set was then placed over the wire into the  gallbladder and the inner stylet and wire were removed. Aspiration of approximately 100 mL of purulence fluid was performed. A sample was collected and sent to the lab for culture. An Amplatz wire was then coiled within the gallbladder lumen. Serial dilation was performed and a 10.2 [place] multipurpose drainage catheter was placed into the gallbladder. Hand injection of contrast demonstrated adequate position with the pigtail portion coiled in the gallbladder infundibulum. Multiple filling defects were noted compatible with known gallstones. The catheter was sutured at the skin entry site with a 0 silk retention suture. A Stay Fix bandage was applied. The patient tolerated the procedure well without complication and was returned to the ICU in stable condition. IMPRESSION: 1. Technically successful ultrasound-guided paracentesis from the left lower quadrant yielding approximately 3.5 L of translucent, straw-colored fluid. 2. Technically successful 10.2 French cholecystostomy tube placement via an intercostal, transhepatic route. PLAN: 1. Recommend repeat paracentesis in 24-48 hours for further evacuation of residual ascites. 2. Keep cholecystostomy tube to bag drainage. Return on outpatient basis in 6-8 weeks for routine cholecystostomy tube check and exchange. Ruthann Cancer, MD [facility] Radiology Electronically Signed   By: Ruthann Cancer MD   On: 11/01/2020 07:44    Anti-infectives: Anti-infectives (From admission, onward)   Start     Dose/Rate Route Frequency Ordered Stop   10/29/20 1000  anidulafungin (ERAXIS) 100 mg in sodium chloride 0.9 % 100 mL IVPB  Status:  Discontinued       "  Followed by" Linked Group Details   100 mg 78 mL/hr over 100 Minutes Intravenous Every 24 hours 10/28/20 0207 10/28/20 0240   10/28/20 2230  vancomycin (VANCOREADY) IVPB 1500 mg/300 mL        1,500 mg 150 mL/hr over 120 Minutes Intravenous  Once 10/28/20 2121 10/28/20 2347    10/28/20 2121  vancomycin variable dose per unstable renal function (pharmacist dosing)  Status:  Discontinued         Does not apply See admin instructions 10/28/20 2121 10/29/20 1133   10/28/20 1800  meropenem (MERREM) 1 g in sodium chloride 0.9 % 100 mL IVPB        1 g 200 mL/hr over 30 Minutes Intravenous Every 12 hours 10/28/20 0217     10/28/20 0300  anidulafungin (ERAXIS) 200 mg in sodium chloride 0.9 % 200 mL IVPB  Status:  Discontinued       "Followed by" Linked Group Details   200 mg 78 mL/hr over 200 Minutes Intravenous Every 24 hours 10/28/20 0207 10/28/20 0240   10/28/20 0300  meropenem (MERREM) 1 g in sodium chloride 0.9 % 100 mL IVPB        1 g 200 mL/hr over 30 Minutes Intravenous  Once 10/28/20 0215 10/28/20 0440   10/22/2020 1915  ceFEPIme (MAXIPIME) 2 g in sodium chloride 0.9 % 100 mL IVPB  Status:  Discontinued        2 g 200 mL/hr over 30 Minutes Intravenous  Once 11/05/2020 1902 11/09/2020 1909   10/17/2020 1915  piperacillin-tazobactam (ZOSYN) IVPB 3.375 g  Status:  Discontinued        3.375 g 12.5 mL/hr over 240 Minutes Intravenous Once 10/22/2020 1902 11/05/2020 1909   10/18/2020 1915  meropenem (MERREM) 500 mg in sodium chloride 0.9 % 100 mL IVPB        500 mg 200 mL/hr over 30 Minutes Intravenous  Once 10/30/2020 1909 11/09/2020 2155       Assessment/Plan S/p lap appy 6 weeks ago T2DM CKD stage III Chronic R hip fracture   ?Cirrhosis  Bilious ascites Acute Cholecystitis with septic shock  - peritoneal fluid cxs pending - rare klebsiella pnemoniae, reincubated   - s/p IR paracentesis and percutaneous cholecystostomy placement 12/21 - WBC 11.8, trending down; afebrile - patient still ttp in RUQ - ok to have CLD from a surgical standpoint if alert enough - no acute surgical intervention planned, would anticipate that percutaneous cholecystostomy may be permanent in this patient   FEN: NPO, IVF VTE: SCDs, ok to have chemical DVT ppx from a surgical standpoint ID:  Merrem 12/17>>  LOS: 5 days    Norm Parcel , [facility] Surgery 11/01/2020, 9:03 AM Please see Amion for pager number during day hours 7:00am-4:30pm  Agree with above.  Alphonsa Overall, MD, [facility] Surgery Office phone:  [phone]

## 2020-11-02 ENCOUNTER — Encounter (HOSPITAL_COMMUNITY): Payer: Self-pay

## 2020-11-02 ENCOUNTER — Inpatient Hospital Stay (HOSPITAL_COMMUNITY): Payer: 59

## 2020-11-02 DIAGNOSIS — A419 Sepsis, unspecified organism: Secondary | ICD-10-CM | POA: Diagnosis not present

## 2020-11-02 DIAGNOSIS — R6521 Severe sepsis with septic shock: Secondary | ICD-10-CM | POA: Diagnosis not present

## 2020-11-02 HISTORY — PX: IR PARACENTESIS: IMG2679

## 2020-11-02 LAB — GLUCOSE, CAPILLARY
Glucose-Capillary: 131 mg/dL — ABNORMAL HIGH (ref 70–99)
Glucose-Capillary: 147 mg/dL — ABNORMAL HIGH (ref 70–99)
Glucose-Capillary: 152 mg/dL — ABNORMAL HIGH (ref 70–99)
Glucose-Capillary: 170 mg/dL — ABNORMAL HIGH (ref 70–99)
Glucose-Capillary: 190 mg/dL — ABNORMAL HIGH (ref 70–99)
Glucose-Capillary: 195 mg/dL — ABNORMAL HIGH (ref 70–99)

## 2020-11-02 LAB — BODY FLUID CELL COUNT WITH DIFFERENTIAL
Eos, Fluid: 0 %
Lymphs, Fluid: 15 %
Monocyte-Macrophage-Serous Fluid: 25 % — ABNORMAL LOW (ref 50–90)
Neutrophil Count, Fluid: 60 % — ABNORMAL HIGH (ref 0–25)
Total Nucleated Cell Count, Fluid: 585 cu mm (ref 0–1000)

## 2020-11-02 LAB — CBC
HCT: 22.9 % — ABNORMAL LOW (ref 36.0–46.0)
Hemoglobin: 7.6 g/dL — ABNORMAL LOW (ref 12.0–15.0)
MCH: 29.3 pg (ref 26.0–34.0)
MCHC: 33.2 g/dL (ref 30.0–36.0)
MCV: 88.4 fL (ref 80.0–100.0)
Platelets: 95 10*3/uL — ABNORMAL LOW (ref 150–400)
RBC: 2.59 MIL/uL — ABNORMAL LOW (ref 3.87–5.11)
RDW: 19 % — ABNORMAL HIGH (ref 11.5–15.5)
WBC: 9.1 10*3/uL (ref 4.0–10.5)
nRBC: 0 % (ref 0.0–0.2)

## 2020-11-02 LAB — GLUCOSE, PLEURAL OR PERITONEAL FLUID: Glucose, Fluid: 156 mg/dL

## 2020-11-02 LAB — PREPARE RBC (CROSSMATCH)

## 2020-11-02 LAB — COMPREHENSIVE METABOLIC PANEL WITH GFR
ALT: 11 U/L (ref 0–44)
AST: 18 U/L (ref 15–41)
Albumin: 2.1 g/dL — ABNORMAL LOW (ref 3.5–5.0)
Alkaline Phosphatase: 56 U/L (ref 38–126)
Anion gap: 12 (ref 5–15)
BUN: 41 mg/dL — ABNORMAL HIGH (ref 8–23)
CO2: 23 mmol/L (ref 22–32)
Calcium: 7.9 mg/dL — ABNORMAL LOW (ref 8.9–10.3)
Chloride: 110 mmol/L (ref 98–111)
Creatinine, Ser: 1.86 mg/dL — ABNORMAL HIGH (ref 0.44–1.00)
GFR, Estimated: 27 mL/min — ABNORMAL LOW
Glucose, Bld: 163 mg/dL — ABNORMAL HIGH (ref 70–99)
Potassium: 3 mmol/L — ABNORMAL LOW (ref 3.5–5.1)
Sodium: 145 mmol/L (ref 135–145)
Total Bilirubin: 1 mg/dL (ref 0.3–1.2)
Total Protein: 4.8 g/dL — ABNORMAL LOW (ref 6.5–8.1)

## 2020-11-02 LAB — CULTURE, BLOOD (ROUTINE X 2)
Culture: NO GROWTH
Culture: NO GROWTH

## 2020-11-02 LAB — PROTEIN, PLEURAL OR PERITONEAL FLUID: Total protein, fluid: <3 g/dL

## 2020-11-02 LAB — LACTATE DEHYDROGENASE, PLEURAL OR PERITONEAL FLUID: LD, Fluid: 138 U/L — ABNORMAL HIGH (ref 3–23)

## 2020-11-02 LAB — PROTIME-INR
INR: 1.9 — ABNORMAL HIGH (ref 0.8–1.2)
Prothrombin Time: 21.2 seconds — ABNORMAL HIGH (ref 11.4–15.2)

## 2020-11-02 LAB — GRAM STAIN

## 2020-11-02 LAB — ALBUMIN, PLEURAL OR PERITONEAL FLUID: Albumin, Fluid: <1 g/dL

## 2020-11-02 MED ORDER — POTASSIUM CHLORIDE 20 MEQ PO PACK
40.0000 meq | PACK | Freq: Once | ORAL | Status: AC
  Administered 2020-11-02: 14:00:00 40 meq
  Filled 2020-11-02: qty 2

## 2020-11-02 MED ORDER — LIDOCAINE HCL 1 % IJ SOLN
INTRAMUSCULAR | Status: AC | PRN
  Administered 2020-11-02: 10 mL

## 2020-11-02 MED ORDER — BOOST / RESOURCE BREEZE PO LIQD CUSTOM
1.0000 | Freq: Three times a day (TID) | ORAL | Status: DC
  Administered 2020-11-02 – 2020-11-05 (×7): 1 via ORAL

## 2020-11-02 MED ORDER — METRONIDAZOLE 500 MG PO TABS
500.0000 mg | ORAL_TABLET | Freq: Three times a day (TID) | ORAL | Status: DC
  Administered 2020-11-02 – 2020-11-03 (×2): 500 mg
  Filled 2020-11-02 (×3): qty 1

## 2020-11-02 MED ORDER — SODIUM CHLORIDE 0.9 % IV SOLN
2.0000 g | INTRAVENOUS | Status: DC
  Administered 2020-11-02 – 2020-11-07 (×6): 2 g via INTRAVENOUS
  Filled 2020-11-02 (×4): qty 2
  Filled 2020-11-02: qty 20
  Filled 2020-11-02 (×2): qty 2

## 2020-11-02 MED ORDER — ADULT MULTIVITAMIN W/MINERALS CH
1.0000 | ORAL_TABLET | Freq: Every day | ORAL | Status: DC
  Administered 2020-11-02 – 2020-11-15 (×11): 1 via ORAL
  Filled 2020-11-02 (×14): qty 1

## 2020-11-02 MED ORDER — LIDOCAINE HCL 1 % IJ SOLN
INTRAMUSCULAR | Status: AC
  Filled 2020-11-02: qty 20

## 2020-11-02 MED ORDER — PROSOURCE PLUS PO LIQD
30.0000 mL | Freq: Two times a day (BID) | ORAL | Status: DC
  Administered 2020-11-02 – 2020-11-07 (×9): 30 mL via ORAL
  Filled 2020-11-02 (×11): qty 30

## 2020-11-02 MED ORDER — SODIUM CHLORIDE 0.9% IV SOLUTION: Freq: Once | INTRAVENOUS | Status: AC

## 2020-11-02 MED ORDER — SODIUM CHLORIDE 0.9% FLUSH
5.0000 mL | Freq: Three times a day (TID) | INTRAVENOUS | Status: DC
  Administered 2020-11-02 – 2020-11-15 (×31): 5 mL

## 2020-11-02 NOTE — Progress Notes (Addendum)
Initial Nutrition Assessment  DOCUMENTATION CODES:   Not applicable  INTERVENTION:   Boost Breeze po TID, each supplement provides 250 kcal and 9 grams of protein  ProSource Plus BID  MVI with minerals daily  Recommend liberalize diet from Heart Healthy to Regular - message sent to provider  Encourage PO intake   NUTRITION DIAGNOSIS:   Inadequate oral intake related to poor appetite as evidenced by meal completion < 50%.  GOAL:   Patient will meet greater than or equal to 90% of their needs  MONITOR:   Diet advancement,PO intake,Skin  REASON FOR ASSESSMENT:   NPO/Clear Liquid Diet    ASSESSMENT:   Pt with PMH of DM, CKD 3, cirrhosis, and recent admissions - 08/18/20 for pyelonephritis; 09/18/20 acute appendicitis s/p appendectomy now readmitted 12/17 with hypovolemic shock and cholecystitis.   Per MD daughter desires aggressive care. Spoke with RN, pt tolerated clear liquids today. Plan to advance diet after paracentesis today.   Pt is at very high risk of malnutrition with multiple recent admissions. Inadequate PO intake reported during hospitalizations as well as PTA.   Previous RD notes reviewed.  Per daughter in 10/21 she provides pt with 2-3 meals per day with 2-3 Prosource Plus per day with Ensure/Glucerna to aid in nutrition. Pt does not consume pork.   Pt has been bed bound >13 years  Pt complains ensure/glucerna gives her diarrhea   Pt speaks [place]   Weight hx reviewed; per chart weight is highly variable 70 kg to 90 kg in patient with ascites and deep pitting edema in all extremities.   12/17-12/22 NPO 12/18 s/p paracentesis with 1400 ml removed 12/21 s/p perc chole drain and paracentesis with 3.5 L removed  12/22 s/p paracentesis; diet adv to CLD; meal completion 75% 12/23 s/p paracentesis with 1.85 L removed   Medications reviewed and include: solucortef, SSI KCl x 1  Labs reviewed: K+ 3, BUN: 41, Cr: 1.86 CBG's: 141-131-170-147   UOP:  710 ml  RUQ 10 F drain: 20 ml   Diet Order:   Diet Order            Diet Heart Room service appropriate? Yes; Fluid consistency: Thin  Diet effective now                 EDUCATION NEEDS:   Not appropriate for education at this time  Skin:  Skin Assessment: Skin Integrity Issues: Skin Integrity Issues:: Stage II Stage II: R buttocks  Last BM:  12/19, 12/23 smear  Height:   Ht Readings from Last 1 Encounters:  09/18/20 5\' 3"  (1.6 m)    Weight:   Wt Readings from Last 1 Encounters:  11/02/20 75.5 kg    Ideal Body Weight:  52.2 kg  BMI:  Body mass index is 29.48 kg/m.  Estimated Nutritional Needs:   Kcal:  1750-2000  Protein:  95-110 grams  Fluid:  > 1.7 L/day  Lockie Pares., RD, LDN, CNSC See AMiON for contact information

## 2020-11-02 NOTE — Procedures (Signed)
PROCEDURE SUMMARY:  Successful image-guided paracentesis from the right lateral abdomen.  Yielded 1.85 liters of hazy amber fluid.  No immediate complications.  EBL = 0 mL. Patient tolerated well.   Specimen was sent for labs.  Tele-interpreter [phone] was used throughout today's interaction.  Please see imaging section of Epic for full dictation.   Earley Abide PA-C 11/02/2020 12:56 PM

## 2020-11-02 NOTE — Progress Notes (Signed)
NAME[place] [name], MRN:  [id number], DOB:  1935-02-20, LOS: 6 ADMISSION DATE:  10/26/2020, CONSULTATION DATE:  10/19/2020 REFERRING MD:  Reather Converse, CHIEF COMPLAINT:  Abdominal pain  Brief History   85yF with septic and hypovolemic shock with likely intraabdominal source  History of present illness   85yF with DM2, cirrhosis, CKD3, chronic R hip fracture, recent admission for emphysematous cystitis and pyelitis due to ESBL klebsiella, last discharged 11/23 from admission for acute appendicitis s/p appendectomy 09/18/20. Her course was complicated by recurrent abdominal pain, found to have imaging suggestive of acute cholecystitis although interpretation challengign in setting of her anasarca - she was not felt at the time to be candidate for c-tube (IR concerned at the time for long term infectious complication in setting of her ascites) or surgery due to frailty. When she was discharged her daughter says that she barely ate or drank anything. This evening she developed sudden onset sharp lower abdominal pain prompting their visit to the ED. She's had no nausea, vomiting, diarrhea, hematochezia/melena, fever.  In ED she was given 2L IVF, meropenem, started on levophed through a peripheral IV  Past Medical History  DM2 Cirrhosis [name] Events   10/11/2020 admitted  Consults:  Urology General surgery  Procedures:  Paracentesis 12/17 CBC 12/17 >> Arterial line 12/17 >> 12/21 IR PERC cholecystectomy 12/21 > : 1. Technically successful ultrasound-guided paracentesis from the left lower quadrant yielding approximately 3.5 L of translucent, straw-colored fluid. 2. Technically successful 10.2 French cholecystostomy tube placement via an intercostal, transhepatic route.  PLAN: 1. Recommend repeat paracentesis in 24-48 hours for further evacuation of residual ascites. 2. Keep cholecystostomy tube to bag drainage. Return on outpatient basis in 6-8 weeks for routine  cholecystostomy tube check and exchange.  Significant Diagnostic Tests:  CT A/P 12/17 with rapid accumulation of complex appearing ascites, emphysematous pyelo which may have acute component, bowel wall thickening, cholelithiasis  Micro Data:  12/17 BCx >> staph epi on BCD and 1 of 1 culture >  12/18 BCx >> neg Peritoneal fluid 12/17 >> neutrophils, no organisms seen >>  Urine 12/18 >> multiple 12/21 gall bladder aspirate >> gram negative rods / gram positive cocci  Antimicrobials:  12/17 meropenem >>   Interim history/subjective:  Lying in bed in NAD NO acute event overnight   Objective   Blood pressure (!) 112/53, pulse 82, temperature (!) 97.5 F (36.4 C), temperature source Axillary, resp. rate (!) 28, weight 75.5 kg, SpO2 98 %.        Intake/Output Summary (Last 24 hours) at 11/02/2020 1006 Last data filed at 11/02/2020 0500 Gross per 24 hour  Intake 311.97 ml  Output 655 ml  Net -343.03 ml   Filed Weights   10/31/20 0403 11/01/20 0630 11/02/20 0552  Weight: 82.8 kg 77.8 kg 75.5 kg    Examination: General: Chronically ill appearing elderly female lying in bed in NAD HEENT: [place]/AT, MM pink/moist, PERRL,  Neuro: Alert and interactive CV: s1s2 regular rate and rhythm, no murmur, rubs, or gallops,  PULM:  Faint bilateral crackle, no increased work of breathing, no increased work f breahting GI: soft, bowel sounds active in all 4 quadrants, non-tender, non-distended Extremities: warm/dry, no edema  Skin: no rashes or lesions  Resolved Hospital Problem list   Lactic acidosis  Assessment & Plan:   Septic Shock Cholecystitis -HIDA scan with acute cholecystitis. IR placement of percutaneous cholecystostomy 12/21. Bile culture growing gram neg rod and gram positive cocci. On meropenem day 5.  P: Continue to follow drain cultures Continue meropenem, narrow as able Wean pressors as able Wean stress dose steroids as able Will need repeat IR  paracentesis  Ascites -Due to decompensated cirrhosis +/- peritonitis, note fluid creatinine 2.0 (same as serum creatinine).  P: Continue antibiotics as above Will need repeat thoracentesis 24 to 48 hours post part cholecystectomy Urology consulted with no acute GU interventions recommended  AKI on CKD.  -Creatinine 09/2020 1.4-1.6, Creatinine 2.0->1.85->2.04. Given albumin with lasix yesterday P: Follow renal function and urine output Trend be met Avoid nephrotoxins Ensure adequate renal perfusion Renally dose medications  Goals of care:  Full code per daughter with all aggressive care desired. Daughter has stated that she does not want to meet with palliative care services  Best practice:  Diet: npo for procedure Pain/Anxiety/Delirium protocol (if indicated): no VAP protocol (if indicated): no DVT prophylaxis: hold till procedures this evening completed GI prophylaxis: not indicated Glucose control: SSI Mobility: bed level Code Status: Full Family Communication: pending Disposition: ICU until off levo  Labs   CBC: Recent Labs  Lab 11/03/2020 1814 10/12/2020 1846 10/29/20 0348 10/30/20 0501 10/31/20 0623 11/01/20 0410 11/02/20 0200  WBC 9.4   < > 20.1* 20.2* 18.3* 11.8* 9.1  NEUTROABS 8.7*  --   --   --   --   --   --   HGB 10.4*   < > 8.1* 8.5* 8.4* 8.3* 7.6*  HCT 35.4*   < > 26.3* 27.0* 26.6* 26.9* 22.9*  MCV 97.3   < > 92.3 91.5 90.8 91.2 88.4  PLT 250   < > 177 169 153 153 95*   < > = values in this interval not displayed.    Basic Metabolic Panel: Recent Labs  Lab 10/28/20 0047 10/28/20 1726 10/29/20 0348 10/29/20 0843 10/30/20 0501 10/31/20 0623 11/01/20 0410 11/02/20 0200  NA 138   < > 139 142 141 142 142 145  K 3.3*   < > 6.2* 4.1 3.9 3.8 3.6 3.0*  CL 104   < > 107 107 108 108 108 110  CO2 20*   < > 22 23 20* 21* 19* 23  GLUCOSE 215*   < > 176* 157* 158* 167* 171* 163*  BUN 39*   < > 37* 39* 38* 38* 41* 41*  CREATININE 2.00*   < > 1.84* 1.93*  1.90* 1.85* 2.04* 1.86*  CALCIUM 7.4*   < > 7.4* 7.8* 7.8* 7.9* 7.8* 7.9*  MG 1.5*  --  2.0  --   --   --   --   --   PHOS 4.2  --   --   --   --   --   --   --    < > = values in this interval not displayed.   GFR: Estimated Creatinine Clearance: 22.3 mL/min (A) (by C-G formula based on SCr of 1.86 mg/dL (H)). Recent Labs  Lab 10/28/20 0047 10/28/20 0746 10/28/20 1726 10/29/20 0348 10/30/20 0501 10/31/20 0623 11/01/20 0410 11/02/20 0200  WBC 18.3*  --   --  20.1* 20.2* 18.3* 11.8* 9.1  LATICACIDVEN 6.4* 3.7* 1.3 1.0  --   --   --   --     Liver Function Tests: Recent Labs  Lab 10/28/2020 1814 10/29/20 0348 11/02/20 0200  AST 28 22 18   ALT 12 12 11   ALKPHOS 88 70 56  BILITOT 1.1 1.8* 1.0  PROT 6.3* 5.3* 4.8*  ALBUMIN 1.2* 1.5* 2.1*   Recent  Labs  Lab 10/28/20 0047  LIPASE 10*   No results for input(s): AMMONIA in the last 168 hours.  ABG    Component Value Date/Time   TCO2 23 10/26/2020 1846     Coagulation Profile: Recent Labs  Lab 10/29/2020 1814 11/01/20 0410 11/02/20 0200  INR 1.5* 1.4* 1.9*    Cardiac Enzymes: No results for input(s): CKTOTAL, CKMB, CKMBINDEX, TROPONINI in the last 168 hours.  HbA1C: Hgb A1c MFr Bld  Date/Time Value Ref Range Status  08/18/2020 01:35 AM 7.8 (H) 4.8 - 5.6 % Final    Comment:    (NOTE) Pre diabetes:          5.7%-6.4%  Diabetes:              >6.4%  Glycemic control for   <7.0% adults with diabetes     CBG: Recent Labs  Lab 11/01/20 1542 11/01/20 1929 11/01/20 2349 11/02/20 0411 11/02/20 0834  GLUCAP 130* 141* 131* 170* 147*   CRITICAL CARE Performed by: Johnsie Cancel  Total critical care time: 38 minutes  Critical care time was exclusive of separately billable procedures and treating other patients.  Critical care was necessary to treat or prevent imminent or life-threatening deterioration.  Critical care was time spent personally by me on the following activities: development of treatment  plan with patient and/or surrogate as well as nursing, discussions with consultants, evaluation of patient's response to treatment, examination of patient, obtaining history from patient or surrogate, ordering and performing treatments and interventions, ordering and review of laboratory studies, ordering and review of radiographic studies, pulse oximetry and re-evaluation of patient's condition.  Johnsie Cancel, NP-C [facility] Contact / Pager information can be found on Amion  11/02/2020, 10:15 AM

## 2020-11-03 DIAGNOSIS — R579 Shock, unspecified: Secondary | ICD-10-CM | POA: Diagnosis not present

## 2020-11-03 LAB — TYPE AND SCREEN
ABO/RH(D): A POS
Antibody Screen: NEGATIVE
Unit division: 0

## 2020-11-03 LAB — BASIC METABOLIC PANEL
Anion gap: 11 (ref 5–15)
BUN: 46 mg/dL — ABNORMAL HIGH (ref 8–23)
CO2: 22 mmol/L (ref 22–32)
Calcium: 7.8 mg/dL — ABNORMAL LOW (ref 8.9–10.3)
Chloride: 111 mmol/L (ref 98–111)
Creatinine, Ser: 1.92 mg/dL — ABNORMAL HIGH (ref 0.44–1.00)
GFR, Estimated: 26 mL/min — ABNORMAL LOW (ref >60)
Glucose, Bld: 176 mg/dL — ABNORMAL HIGH (ref 70–99)
Potassium: 3.2 mmol/L — ABNORMAL LOW (ref 3.5–5.1)
Sodium: 144 mmol/L (ref 135–145)

## 2020-11-03 LAB — CBC
HCT: 29 % — ABNORMAL LOW (ref 36.0–46.0)
Hemoglobin: 9.9 g/dL — ABNORMAL LOW (ref 12.0–15.0)
MCH: 29.3 pg (ref 26.0–34.0)
MCHC: 34.1 g/dL (ref 30.0–36.0)
MCV: 85.8 fL (ref 80.0–100.0)
Platelets: 76 10*3/uL — ABNORMAL LOW (ref 150–400)
RBC: 3.38 MIL/uL — ABNORMAL LOW (ref 3.87–5.11)
RDW: 17.8 % — ABNORMAL HIGH (ref 11.5–15.5)
WBC: 10.9 10*3/uL — ABNORMAL HIGH (ref 4.0–10.5)
nRBC: 0 % (ref 0.0–0.2)

## 2020-11-03 LAB — AEROBIC/ANAEROBIC CULTURE W GRAM STAIN (SURGICAL/DEEP WOUND)

## 2020-11-03 LAB — BPAM RBC
Blood Product Expiration Date: 202201202359
ISSUE DATE / TIME: 202112232056
Unit Type and Rh: 6200

## 2020-11-03 LAB — GLUCOSE, CAPILLARY
Glucose-Capillary: 163 mg/dL — ABNORMAL HIGH (ref 70–99)
Glucose-Capillary: 183 mg/dL — ABNORMAL HIGH (ref 70–99)
Glucose-Capillary: 196 mg/dL — ABNORMAL HIGH (ref 70–99)
Glucose-Capillary: 198 mg/dL — ABNORMAL HIGH (ref 70–99)
Glucose-Capillary: 200 mg/dL — ABNORMAL HIGH (ref 70–99)
Glucose-Capillary: 207 mg/dL — ABNORMAL HIGH (ref 70–99)

## 2020-11-03 MED ORDER — INSULIN ASPART 100 UNIT/ML ~~LOC~~ SOLN
0.0000 [IU] | Freq: Three times a day (TID) | SUBCUTANEOUS | Status: DC
  Administered 2020-11-03: 23:00:00 3 [IU] via SUBCUTANEOUS
  Administered 2020-11-04: 17:00:00 2 [IU] via SUBCUTANEOUS
  Administered 2020-11-04 (×2): 3 [IU] via SUBCUTANEOUS
  Administered 2020-11-04: 06:00:00 1 [IU] via SUBCUTANEOUS
  Administered 2020-11-05 (×3): 3 [IU] via SUBCUTANEOUS
  Administered 2020-11-05: 23:00:00 2 [IU] via SUBCUTANEOUS
  Administered 2020-11-06: 18:00:00 5 [IU] via SUBCUTANEOUS
  Administered 2020-11-06: 14:00:00 3 [IU] via SUBCUTANEOUS
  Administered 2020-11-06: 22:00:00 5 [IU] via SUBCUTANEOUS
  Administered 2020-11-06 – 2020-11-07 (×5): 3 [IU] via SUBCUTANEOUS
  Administered 2020-11-08: 12:00:00 2 [IU] via SUBCUTANEOUS
  Administered 2020-11-08: 06:00:00 3 [IU] via SUBCUTANEOUS
  Administered 2020-11-08 (×2): 2 [IU] via SUBCUTANEOUS
  Administered 2020-11-09: 22:00:00 1 [IU] via SUBCUTANEOUS
  Administered 2020-11-09 (×2): 2 [IU] via SUBCUTANEOUS
  Administered 2020-11-10 (×3): 1 [IU] via SUBCUTANEOUS
  Administered 2020-11-12 (×5): 2 [IU] via SUBCUTANEOUS

## 2020-11-03 MED ORDER — METRONIDAZOLE 500 MG PO TABS
500.0000 mg | ORAL_TABLET | Freq: Three times a day (TID) | ORAL | Status: DC
  Administered 2020-11-03 – 2020-11-08 (×14): 500 mg via ORAL
  Filled 2020-11-03 (×17): qty 1

## 2020-11-03 NOTE — Progress Notes (Signed)
Unable to replace K of 3.2 d/t GFR 26. Please review with day team.

## 2020-11-03 NOTE — Progress Notes (Addendum)
NAMESeara [name], MRN:  [id number], DOB:  1935/06/09, LOS: 7 ADMISSION DATE:  10/11/2020, CONSULTATION DATE:  11/08/2020 REFERRING MD:  Reather Converse, CHIEF COMPLAINT:  Abdominal pain  Brief History   85yF with septic and hypovolemic shock with likely intraabdominal source  History of present illness   85yF with DM2, cirrhosis, CKD3, chronic R hip fracture, recent admission for emphysematous cystitis and pyelitis due to ESBL klebsiella, last discharged 11/23 from admission for acute appendicitis s/p appendectomy 09/18/20. Her course was complicated by recurrent abdominal pain, found to have imaging suggestive of acute cholecystitis although interpretation challengign in setting of her anasarca - she was not felt at the time to be candidate for c-tube (IR concerned at the time for long term infectious complication in setting of her ascites) or surgery due to frailty. When she was discharged her daughter says that she barely ate or drank anything. This evening she developed sudden onset sharp lower abdominal pain prompting their visit to the ED. She's had no nausea, vomiting, diarrhea, hematochezia/melena, fever.  In ED she was given 2L IVF, meropenem, started on levophed through a peripheral IV  Past Medical History  DM2 Cirrhosis [facility] Events   12/17 Admitted 12/21 s/p perc chole drain  with + cx from this. On abx and will change as sensitivities return. S/p paracentesis  12/23 remains on levo at 51mg will give 1uprbc for hgb 7.5 this am and follow. Cont midodrine started yesterday. Repeat para done  Consults:  Urology General surgery  Procedures:  Paracentesis 12/17 Arterial line 12/17 >> 12/21 IR PERC cholecystectomy and para 12/21  Repeat para 12/23  Significant Diagnostic Tests:  CT A/P 12/17 with rapid accumulation of complex appearing ascites, emphysematous pyelo which may have acute component, bowel wall thickening, cholelithiasis  Micro Data:  12/17 BCx >>  staph epi on BCD and 1 of 1 culture >  12/18 BCx >> neg Peritoneal fluid 12/17 >> neutrophils, no organisms seen >>  Urine 12/18 >> multiple 12/21 gall bladder aspirate >> Klebsiella Peritoneal fluid 12/23 >> no growth  Antimicrobials:  12/17 meropenem >> 12/23  Ceftriaxone 12/23 >>  Flagyl 12/23 >>   Interim history/subjective:   No acute events overnight.  Weaned off pressors.  Objective   Blood pressure (!) 96/43, pulse 88, temperature (!) 97.3 F (36.3 C), temperature source Axillary, resp. rate 15, weight 75.3 kg, SpO2 99 %.        Intake/Output Summary (Last 24 hours) at 11/03/2020 05885Last data filed at 11/03/2020 00277Gross per 24 hour  Intake 331.54 ml  Output 325 ml  Net 6.54 ml   Filed Weights   11/01/20 0630 11/02/20 0552 11/03/20 0500  Weight: 77.8 kg 75.5 kg 75.3 kg    Examination: Gen:      No acute distress, chronically ill HEENT:  EOMI, sclera anicteric Neck:     No masses; no thyromegaly Lungs:    Clear to auscultation bilaterally; normal respiratory effort CV:         Regular rate and rhythm; no murmurs Abd:      + bowel sounds; soft, non-tender; no palpable masses, no distension Ext:    No edema; adequate peripheral perfusion Skin:      Warm and dry; no rash Neuro: Awake, nods  Labs/imaging personally reviewed Significant for potassium 3.2, BUN/creatinine 46/1.92 WBC 10.9, hemoglobin 9.9, platelets 76  Resolved Hospital Problem list   Lactic acidosis  Assessment & Plan:   Septic Shock Cholecystitis, peritonitis Klebsiella  infection -HIDA scan with acute cholecystitis. IR placement of percutaneous cholecystostomy 12/21.  P: Continue to follow drain cultures Narrow antibiotics to ceftriaxone, Flagyl DC stress dose steroids tomorrow if she remains hemodynamically stable. DC central line Continue midodrine  Ascites -Due to decompensated cirrhosis , peritonitis, note fluid creatinine 2.0 (same as serum creatinine).  P: Continue  antibiotics as above Urology consulted with no acute GU interventions recommended  AKI on CKD.  P: Follow renal function and urine output Trend be met Avoid nephrotoxins Ensure adequate renal perfusion Renally dose medications  Goals of care:  Full code per daughter with all aggressive care desired. Daughter has stated that she does not want to meet with palliative care services  Best practice:  Diet: npo for procedure Pain/Anxiety/Delirium protocol (if indicated): no VAP protocol (if indicated): no DVT prophylaxis: hold till procedures this evening completed GI prophylaxis: not indicated Glucose control: SSI Mobility: bed level Code Status: Full Family Communication: pending Disposition: Transfer to progressive care.  To [facility] service and PCCM of 12/25.  Signature:   The patient is critically ill with multiple organ system failure and requires high complexity decision making for assessment and support, frequent evaluation and titration of therapies, advanced monitoring, review of radiographic studies and interpretation of complex data.   Critical Care Time devoted to patient care services, exclusive of separately billable procedures, described in this note is 45 minutes.     MD [facility] Please see [email or web address] for pager details.  11/03/2020, 8:33 AM 

## 2020-11-03 NOTE — Plan of Care (Signed)
  Problem: Clinical Measurements: Goal: Ability to maintain clinical measurements within normal limits will improve Outcome: Progressing Goal: Respiratory complications will improve Outcome: Progressing   Problem: Education: Goal: Knowledge of General Education information will improve Description: Including pain rating scale, medication(s)/side effects and non-pharmacologic comfort measures Outcome: Not Progressing   Problem: Activity: Goal: Risk for activity intolerance will decrease Outcome: Not Progressing

## 2020-11-03 NOTE — Progress Notes (Signed)
Upon assessment, patient was oriented only to self and place. Daughter was utilized as Optometrist. Daughter stated that patient's mentation changed yesterday around 5pm, when she started making less sense. Previous note from earlier today stated patient had been oriented x3. On-call MD notified and is aware. Will continue to monitor closely.  Gailen Shelter RN

## 2020-11-04 DIAGNOSIS — K7460 Unspecified cirrhosis of liver: Secondary | ICD-10-CM | POA: Diagnosis present

## 2020-11-04 DIAGNOSIS — E669 Obesity, unspecified: Secondary | ICD-10-CM | POA: Diagnosis present

## 2020-11-04 DIAGNOSIS — K810 Acute cholecystitis: Secondary | ICD-10-CM

## 2020-11-04 DIAGNOSIS — A414 Sepsis due to anaerobes: Secondary | ICD-10-CM | POA: Diagnosis not present

## 2020-11-04 DIAGNOSIS — I4891 Unspecified atrial fibrillation: Secondary | ICD-10-CM

## 2020-11-04 DIAGNOSIS — N184 Chronic kidney disease, stage 4 (severe): Secondary | ICD-10-CM | POA: Diagnosis not present

## 2020-11-04 DIAGNOSIS — N179 Acute kidney failure, unspecified: Secondary | ICD-10-CM | POA: Diagnosis present

## 2020-11-04 LAB — GLUCOSE, CAPILLARY
Glucose-Capillary: 171 mg/dL — ABNORMAL HIGH (ref 70–99)
Glucose-Capillary: 204 mg/dL — ABNORMAL HIGH (ref 70–99)
Glucose-Capillary: 189 mg/dL — ABNORMAL HIGH (ref 70–99)
Glucose-Capillary: 233 mg/dL — ABNORMAL HIGH (ref 70–99)

## 2020-11-04 LAB — BASIC METABOLIC PANEL
Anion gap: 13 (ref 5–15)
BUN: 51 mg/dL — ABNORMAL HIGH (ref 8–23)
CO2: 21 mmol/L — ABNORMAL LOW (ref 22–32)
Calcium: 7.6 mg/dL — ABNORMAL LOW (ref 8.9–10.3)
Chloride: 110 mmol/L (ref 98–111)
Creatinine, Ser: 2.04 mg/dL — ABNORMAL HIGH (ref 0.44–1.00)
GFR, Estimated: 24 mL/min — ABNORMAL LOW (ref >60)
Glucose, Bld: 213 mg/dL — ABNORMAL HIGH (ref 70–99)
Potassium: 3.2 mmol/L — ABNORMAL LOW (ref 3.5–5.1)
Sodium: 144 mmol/L (ref 135–145)

## 2020-11-04 LAB — PHOSPHORUS: Phosphorus: 3.8 mg/dL (ref 2.5–4.6)

## 2020-11-04 LAB — CBC
HCT: 31.1 % — ABNORMAL LOW (ref 36.0–46.0)
Hemoglobin: 10.2 g/dL — ABNORMAL LOW (ref 12.0–15.0)
MCH: 28.3 pg (ref 26.0–34.0)
MCHC: 32.8 g/dL (ref 30.0–36.0)
MCV: 86.1 fL (ref 80.0–100.0)
Platelets: 70 10*3/uL — ABNORMAL LOW (ref 150–400)
RBC: 3.61 MIL/uL — ABNORMAL LOW (ref 3.87–5.11)
RDW: 17.6 % — ABNORMAL HIGH (ref 11.5–15.5)
WBC: 11.4 10*3/uL — ABNORMAL HIGH (ref 4.0–10.5)
nRBC: 0 % (ref 0.0–0.2)

## 2020-11-04 LAB — MAGNESIUM: Magnesium: 1.8 mg/dL (ref 1.7–2.4)

## 2020-11-04 NOTE — Progress Notes (Addendum)
PROGRESS NOTE  Whitney [name] IRS:[id number] DOB: 1934/12/26 DOA: 10/17/2020 PCP: Patient, No Pcp Per  HPI/Recap of past 43 hours: 85 year old female with past medical history of poorly controlled diabetes mellitus, cirrhosis, stage III chronic kidney disease who had a recent admission for emphysematous cystitis/pyelitis due to ESBL Klebsiella plus status post appendectomy for appendicitis last month who presented to the emergency room on 12/17 with complaints of abdominal pain developing suddenly.  Patient's previous hospitalization course was complicated by recurrent abdominal pain and imaging suggested acute cholecystitis although interpretation was challenging in the setting of her anasarca.  At that time, she has not felt to be a candidate for surgery due to frailty or placement of drainage tube as concerns for long-term infectious complications in the setting of her ascites.  In the emergency room, she was found to be in shock with secondary acute kidney injury, lactic acidosis and, shock suspected to be from sepsis from an abdominal source.  She was given fluids, IV antibiotics and started on pressor support.  CT of abdomen and pelvis noted ascites with suspected secondary peritonitis, bowel wall thickening, cholelithiasis and emphysematous pyelonephritis.    Placed in the ICU to the critical care service.  Urology consulted and it was felt that her ascites were not related to her past UTI and only recommendation was antibiotics also cover possible urinary bacteria if present.  Given comorbidities and past history, palliative care consulted, as they have seen patient in the past, however patient's daughter refused this consultation.  General surgery consulted but did not see a significant inflammation of the intestines or areas of abdomen that would point to source requiring surgery.  It was recommended by all for continued antibiotics, follow-up cultures and see how she progresses.  Patient underwent  a HIDA scan on 12/20 which noted cystic duct obstruction and acute cholecystitis and general surgery followed up recommending percutaneous cholecystostomy tube placement.  Interventional radiology followed up and patient underwent therapeutic paracentesis (with 3.5 L of fluid removed) prior to percutaneous cholecystostomy tube placement on 12/21 without incident.  Fluid cultures grew out Klebsiella and antibiotics changed from broad-spectrum meropenem to ceftriaxone and Flagyl.  Infectious disease recommended that drain should remain indefinitely as well as antibiotics should be 14 days of p.o. therapy after clinical improvement on IV antibiotics.  White blood cell count improved and patient started on clear liquids.  With this improvement, patient transferred to stepdown and to the hospitalist service as of 12/25.    Today, patient is okay.  Through translator she complains of some pain in her right upper quadrant.  She denies any shortness of breath.  Assessment/Plan: Principal Problem: Klebsiella septic shock from acute on chronic cholecystitis, present on admission: Patient met criteria with lactic acidosis, gallbladder source, hypotension requiring pressor support: White blood cell count ever so slightly trending upward although still relatively stable.  She has been able to be weaned off of pressors although still on midodrine.  Continue IV antibiotics and as she becomes more stable, can change over to p.o. x14 days Active Problems:   Uncontrolled diabetes mellitus ([place]): A1c almost 3 months ago at 7.8.  Will likely repeat. Acute kidney injury in the setting of CKD (chronic kidney disease), stage III/IV ([place]): Creatinine at 2 with GFR of 24.  Not much change during this hospitalization.  Review of previous records actually indicate closer to a stage IV nephropathy with a GFR of under 30.  Atrial fibrillation: Stable, rate controlled  Malnutrition: Patient at high risk  given multiple admissions  and poor p.o. intake.  Now that she is being allowed to eat, diet advanced plus boost breeze 3 times daily plus Prosource twice daily plus multivitamin.    Pressure injury of skin: Decubitus ulcers, present on admission.  Patient with air mattress.  Wound care   Other ascites: Chronic.   Obesity (BMI 30-39.9): Meets criteria for BMI greater than 30.    Cirrhosis ([place])  Coagulopathy: Secondary to her cirrhosis.  INR at 1.9  Code Status: Had been DNR in the past, changed to full code by daughter and confirmed.  Patient's daughter does not want palliative care involved.  Family Communication: Updated daughter by phone  Disposition Plan: Unclear.  She has been bedbound for years  Active issues: Needs repeat paracentesis. Confirm stability of her renal function. Advancement and tolerating of her diet   Consultants:  Critical Care  Interventional Radiology  Palliative Care (cancelled)  Urology   General Surgery  Procedures: Paracentesis 12/18 Arterial line 12/17 >> 12/21 IR PERC cholecystectomy and para 12/21  Repeat para 12/23  Central line placed 12/18-12/24  Antimicrobials:  IV meropenem 12/17-12/23  IV Flagyl 12/23-present  IV Rocephin 12/23-present  DVT prophylaxis: SCDs   Objective: Vitals:   11/04/20 0912 11/04/20 1210  BP: 127/77 112/69  Pulse: 84 100  Resp: 18 17  Temp: 97.8 F (36.6 C) 97.8 F (36.6 C)  SpO2: 100% 100%    Intake/Output Summary (Last 24 hours) at 11/04/2020 1457 Last data filed at 11/04/2020 1427 Gross per 24 hour  Intake 280 ml  Output 300 ml  Net -20 ml   Filed Weights   11/03/20 0500 11/03/20 2000 11/04/20 0417  Weight: 75.3 kg 75.3 kg 79.4 kg   Body mass index is 33.07 kg/m.  Exam:   General: Awake, oriented x2, mild distress secondary to pain  HEENT: Normocephalic and atraumatic, mucous members are dry, poor dentition  Cardiovascular: Irregular rhythm, rate controlled  Respiratory: Decreased breath sounds  throughout secondary to body habitus  Abdomen: Soft, distended, nontender, hypoactive bowel sounds  Musculoskeletal: No clubbing or cyanosis, trace pitting edema  Skin: Decubitus ulcers, present on admission  Psychiatry: Appears appropriate, no evidence of psychoses   Data Reviewed: CBC: Recent Labs  Lab 10/31/20 0623 11/01/20 0410 11/02/20 0200 11/03/20 0449 11/04/20 0117  WBC 18.3* 11.8* 9.1 10.9* 11.4*  HGB 8.4* 8.3* 7.6* 9.9* 10.2*  HCT 26.6* 26.9* 22.9* 29.0* 31.1*  MCV 90.8 91.2 88.4 85.8 86.1  PLT 153 153 95* 76* 70*   Basic Metabolic Panel: Recent Labs  Lab 10/29/20 0348 10/29/20 0843 10/31/20 0623 11/01/20 0410 11/02/20 0200 11/03/20 0449 11/04/20 0117  NA 139   < > 142 142 145 144 144  K 6.2*   < > 3.8 3.6 3.0* 3.2* 3.2*  CL 107   < > 108 108 110 111 110  CO2 22   < > 21* 19* 23 22 21*  GLUCOSE 176*   < > 167* 171* 163* 176* 213*  BUN 37*   < > 38* 41* 41* 46* 51*  CREATININE 1.84*   < > 1.85* 2.04* 1.86* 1.92* 2.04*  CALCIUM 7.4*   < > 7.9* 7.8* 7.9* 7.8* 7.6*  MG 2.0  --   --   --   --   --  1.8  PHOS  --   --   --   --   --   --  3.8   < > = values in this interval not displayed.  GFR: Estimated Creatinine Clearance: 19.9 mL/min (A) (by C-G formula based on SCr of 2.04 mg/dL (H)). Liver Function Tests: Recent Labs  Lab 10/29/20 0348 11/02/20 0200  AST 22 18  ALT 12 11  ALKPHOS 70 56  BILITOT 1.8* 1.0  PROT 5.3* 4.8*  ALBUMIN 1.5* 2.1*   No results for input(s): LIPASE, AMYLASE in the last 168 hours. No results for input(s): AMMONIA in the last 168 hours. Coagulation Profile: Recent Labs  Lab 11/01/20 0410 11/02/20 0200  INR 1.4* 1.9*   Cardiac Enzymes: No results for input(s): CKTOTAL, CKMB, CKMBINDEX, TROPONINI in the last 168 hours. BNP (last 3 results) No results for input(s): PROBNP in the last 8760 hours. HbA1C: No results for input(s): HGBA1C in the last 72 hours. CBG: Recent Labs  Lab 11/03/20 1101 11/03/20 1700  11/03/20 2106 11/04/20 0605 11/04/20 1209  GLUCAP 200* 196* 207* 171* 204*   Lipid Profile: No results for input(s): CHOL, HDL, LDLCALC, TRIG, CHOLHDL, LDLDIRECT in the last 72 hours. Thyroid Function Tests: No results for input(s): TSH, T4TOTAL, FREET4, T3FREE, THYROIDAB in the last 72 hours. Anemia Panel: No results for input(s): VITAMINB12, FOLATE, FERRITIN, TIBC, IRON, RETICCTPCT in the last 72 hours. Urine analysis:    Component Value Date/Time   COLORURINE YELLOW 10/28/2020 0222   APPEARANCEUR TURBID (A) 10/28/2020 0222   LABSPEC 1.013 10/28/2020 0222   PHURINE 5.0 10/28/2020 0222   GLUCOSEU NEGATIVE 10/28/2020 0222   HGBUR MODERATE (A) 10/28/2020 0222   BILIRUBINUR NEGATIVE 10/28/2020 0222   KETONESUR NEGATIVE 10/28/2020 0222   PROTEINUR 100 (A) 10/28/2020 0222   NITRITE NEGATIVE 10/28/2020 0222   LEUKOCYTESUR LARGE (A) 10/28/2020 0222   Sepsis Labs: @LABRCNTIP (procalcitonin:4,lacticidven:4)  ) Recent Results (from the past 240 hour(s))  Blood culture (routine single)     Status: Abnormal   Collection Time: 11/07/2020  6:14 PM   Specimen: BLOOD  Result Value Ref Range Status   Specimen Description BLOOD RIGHT ANTECUBITAL  Final   Special Requests   Final    BOTTLES DRAWN AEROBIC AND ANAEROBIC Blood Culture adequate volume   Culture  Setup Time   Final    GRAM POSITIVE COCCI IN CLUSTERS AEROBIC BOTTLE ONLY CRITICAL RESULT CALLED TO, READ BACK BY AND VERIFIED WITH: [place] AT 3108 10/28/20 BY L BENFIELD    Culture (A)  Final    STAPHYLOCOCCUS EPIDERMIDIS THE SIGNIFICANCE OF ISOLATING THIS ORGANISM FROM A SINGLE SET OF BLOOD CULTURES WHEN MULTIPLE SETS ARE DRAWN IS UNCERTAIN. PLEASE NOTIFY THE MICROBIOLOGY DEPARTMENT WITHIN ONE WEEK IF SPECIATION AND SENSITIVITIES ARE REQUIRED. Performed at [facility], [place] [address], [place], [place] [address]    Report Status 10/29/2020 FINAL  Final  Blood Culture ID Panel (Reflexed)     Status: Abnormal    Collection Time: 11/06/2020  6:14 PM  Result Value Ref Range Status   Enterococcus faecalis NOT DETECTED NOT DETECTED Final   Enterococcus Faecium NOT DETECTED NOT DETECTED Final   Listeria monocytogenes NOT DETECTED NOT DETECTED Final   Staphylococcus species DETECTED (A) NOT DETECTED Final    Comment: CRITICAL RESULT CALLED TO, READ BACK BY AND VERIFIED WITH: PHARMD M BITONTI AT 2108 10/28/20 BY L BENFIELD    Staphylococcus aureus (BCID) NOT DETECTED NOT DETECTED Final   Staphylococcus epidermidis DETECTED (A) NOT DETECTED Final    Comment: Methicillin (oxacillin) resistant coagulase negative staphylococcus. Possible blood culture contaminant (unless isolated from more than one blood culture draw or clinical case suggests pathogenicity). No antibiotic treatment  is indicated for blood  culture contaminants. CRITICAL RESULT CALLED TO, READ BACK BY AND VERIFIED WITH: PHARMD M BITONTI AT 2108 10/28/20 BY L BENFIELD    Staphylococcus lugdunensis NOT DETECTED NOT DETECTED Final   Streptococcus species NOT DETECTED NOT DETECTED Final   Streptococcus agalactiae NOT DETECTED NOT DETECTED Final   Streptococcus pneumoniae NOT DETECTED NOT DETECTED Final   Streptococcus pyogenes NOT DETECTED NOT DETECTED Final   A.calcoaceticus-baumannii NOT DETECTED NOT DETECTED Final   Bacteroides fragilis NOT DETECTED NOT DETECTED Final   Enterobacterales NOT DETECTED NOT DETECTED Final   Enterobacter cloacae complex NOT DETECTED NOT DETECTED Final   Escherichia coli NOT DETECTED NOT DETECTED Final   Klebsiella aerogenes NOT DETECTED NOT DETECTED Final   Klebsiella oxytoca NOT DETECTED NOT DETECTED Final   Klebsiella pneumoniae NOT DETECTED NOT DETECTED Final   Proteus species NOT DETECTED NOT DETECTED Final   Salmonella species NOT DETECTED NOT DETECTED Final   Serratia marcescens NOT DETECTED NOT DETECTED Final   Haemophilus influenzae NOT DETECTED NOT DETECTED Final   Neisseria meningitidis NOT DETECTED  NOT DETECTED Final   Pseudomonas aeruginosa NOT DETECTED NOT DETECTED Final   Stenotrophomonas maltophilia NOT DETECTED NOT DETECTED Final   Candida albicans NOT DETECTED NOT DETECTED Final   Candida auris NOT DETECTED NOT DETECTED Final   Candida glabrata NOT DETECTED NOT DETECTED Final   Candida krusei NOT DETECTED NOT DETECTED Final   Candida parapsilosis NOT DETECTED NOT DETECTED Final   Candida tropicalis NOT DETECTED NOT DETECTED Final   Cryptococcus neoformans/gattii NOT DETECTED NOT DETECTED Final   Methicillin resistance mecA/C DETECTED (A) NOT DETECTED Final    Comment: CRITICAL RESULT CALLED TO, READ BACK BY AND VERIFIED WITH: Ellin Mayhew BITONTI AT 2108 10/28/20 BY L BENFIELD Performed at [facility], [address], [place], [place] [address]   Resp Panel by RT-PCR (Flu A&B, Covid) Nasopharyngeal Swab     Status: None   Collection Time: 10/17/2020  6:15 PM   Specimen: Nasopharyngeal Swab; Nasopharyngeal(NP) swabs in vial transport medium  Result Value Ref Range Status   SARS Coronavirus 2 by RT PCR NEGATIVE NEGATIVE Final    Comment: (NOTE) SARS-CoV-2 target nucleic acids are NOT DETECTED.  The SARS-CoV-2 RNA is generally detectable in upper respiratory specimens during the acute phase of infection. The lowest concentration of SARS-CoV-2 viral copies this assay can detect is 138 copies/mL. A negative result does not preclude SARS-Cov-2 infection and should not be used as the sole basis for treatment or other patient management decisions. A negative result may occur with  improper specimen collection/handling, submission of specimen other than nasopharyngeal swab, presence of viral mutation(s) within the areas targeted by this assay, and inadequate number of viral copies(<138 copies/mL). A negative result must be combined with clinical observations, patient history, and epidemiological information. The expected result is Negative.  Fact Sheet for Patients:   EntrepreneurPulse.com.au  Fact Sheet for Healthcare Providers:  IncredibleEmployment.be  This test is no t yet approved or cleared by the [place] FDA and  has been authorized for detection and/or diagnosis of SARS-CoV-2 by FDA under an Emergency Use Authorization (EUA). This EUA will remain  in effect (meaning this test can be used) for the duration of the COVID-19 declaration under Section 564(b)(1) of the Act, 21 U.S.C.section 360bbb-3(b)(1), unless the authorization is terminated  or revoked sooner.       Influenza A by PCR NEGATIVE NEGATIVE Final   Influenza B by PCR NEGATIVE NEGATIVE Final  Comment: (NOTE) The Xpert Xpress SARS-CoV-2/FLU/RSV plus assay is intended as an aid in the diagnosis of influenza from Nasopharyngeal swab specimens and should not be used as a sole basis for treatment. Nasal washings and aspirates are unacceptable for Xpert Xpress SARS-CoV-2/FLU/RSV testing.  Fact Sheet for Patients: EntrepreneurPulse.com.au  Fact Sheet for Healthcare Providers: IncredibleEmployment.be  This test is not yet approved or cleared by the [place] FDA and has been authorized for detection and/or diagnosis of SARS-CoV-2 by FDA under an Emergency Use Authorization (EUA). This EUA will remain in effect (meaning this test can be used) for the duration of the COVID-19 declaration under Section 564(b)(1) of the Act, 21 U.S.C. section 360bbb-3(b)(1), unless the authorization is terminated or revoked.  Performed at [facility], [place] [address], [place], [place] [address]   Body fluid culture (includes gram stain)     Status: None (Preliminary result)   Collection Time: 10/14/2020 11:38 PM   Specimen: Peritoneal Washings; Peritoneal Fluid  Result Value Ref Range Status   Specimen Description PERITONEAL  Final   Special Requests NONE  Final   Gram Stain   Final    ABUNDANT WBC PRESENT,  PREDOMINANTLY PMN NO ORGANISMS SEEN    Culture   Final    RARE KLEBSIELLA PNEUMONIAE Sent to [place] for further susceptibility testing. Performed at [facility], [place] [address], [place], [place] [address]    Report Status PENDING  Incomplete  Fungus Culture With Stain     Status: None (Preliminary result)   Collection Time: 10/21/2020 11:38 PM   Specimen: Peritoneal; Pleural Fluid  Result Value Ref Range Status   Fungus Stain Final report  Final    Comment: (NOTE) Performed At: [facility] [place], [place] [id number] Rush Farmer MD [id number]    Fungus (Mycology) Culture PENDING  Incomplete   Fungal Source PERITONEAL  Final    Comment: Performed at [facility], [place] [address], [place], [place] [address]  Fungus Culture Result     Status: None   Collection Time: 10/22/2020 11:38 PM  Result Value Ref Range Status   Result 1 Comment  Final    Comment: (NOTE) KOH/Calcofluor preparation:  no fungus observed. Performed At: [facility] [place], [place] [id number] Rush Farmer MD [id number]   MRSA PCR Screening     Status: None   Collection Time: 10/28/20  1:02 AM   Specimen: Nasopharyngeal  Result Value Ref Range Status   MRSA by PCR NEGATIVE NEGATIVE Final    Comment:        The GeneXpert MRSA Assay (FDA approved for NASAL specimens only), is one component of a comprehensive MRSA colonization surveillance program. It is not intended to diagnose MRSA infection nor to guide or monitor treatment for MRSA infections. Performed at [facility], [place] [address] [place], [place] [address]   Urine culture     Status: Abnormal   Collection Time: 10/28/20  2:22 AM   Specimen: In/Out Cath Urine  Result Value Ref Range Status   Specimen Description IN/OUT CATH URINE  Final   Special Requests   Final    NONE Performed at [facility], [place] [address] [place],  [address]    Culture MULTIPLE  SPECIES PRESENT, SUGGEST RECOLLECTION (A)  Final   Report Status 10/29/2020 FINAL  Final  Culture, blood (Routine X 2) w Reflex to ID Panel     Status: None   Collection  Time: 10/28/20  9:35 PM   Specimen: BLOOD  Result Value Ref Range Status   Specimen Description BLOOD RIGHT ARM  Final   Special Requests   Final    BOTTLES DRAWN AEROBIC AND ANAEROBIC Blood Culture results may not be optimal due to an inadequate volume of blood received in culture bottles   Culture   Final    NO GROWTH 5 DAYS Performed at [facility], [place] [address] [place], [place] [address]    Report Status 11/02/2020 FINAL  Final  Culture, blood (Routine X 2) w Reflex to ID Panel     Status: None   Collection Time: 10/28/20 10:00 PM   Specimen: BLOOD  Result Value Ref Range Status   Specimen Description BLOOD LEFT HAND  Final   Special Requests   Final    BOTTLES DRAWN AEROBIC AND ANAEROBIC Blood Culture results may not be optimal due to an excessive volume of blood received in culture bottles   Culture   Final    NO GROWTH 5 DAYS Performed at [facility], [place] [address] [place], [place] [address]    Report Status 11/02/2020 FINAL  Final  Aerobic/Anaerobic Culture (surgical/deep wound)     Status: None   Collection Time: 10/31/20  5:22 PM   Specimen: Abscess  Result Value Ref Range Status   Specimen Description ABSCESS GALL BLADDER  Final   Special Requests NONE  Final   Gram Stain   Final    MODERATE WBC PRESENT,BOTH PMN AND MONONUCLEAR FEW GRAM NEGATIVE RODS FEW GRAM POSITIVE COCCI IN PAIRS IN CHAINS    Culture   Final    ABUNDANT KLEBSIELLA PNEUMONIAE NO ANAEROBES ISOLATED Performed at [facility], [place] [address] [place], [place] [address]    Report Status 11/03/2020 FINAL  Final   Organism ID, Bacteria KLEBSIELLA PNEUMONIAE  Final      Susceptibility   Klebsiella pneumoniae - MIC*    AMPICILLIN >=32 RESISTANT Resistant     CEFAZOLIN <=4 SENSITIVE Sensitive     CEFEPIME  <=0.12 SENSITIVE Sensitive     CEFTAZIDIME <=1 SENSITIVE Sensitive     CEFTRIAXONE <=0.25 SENSITIVE Sensitive     CIPROFLOXACIN <=0.25 SENSITIVE Sensitive     GENTAMICIN <=1 SENSITIVE Sensitive     IMIPENEM <=0.25 SENSITIVE Sensitive     TRIMETH/SULFA <=20 SENSITIVE Sensitive     AMPICILLIN/SULBACTAM 4 SENSITIVE Sensitive     PIP/TAZO <=4 SENSITIVE Sensitive     * ABUNDANT KLEBSIELLA PNEUMONIAE  Gram stain     Status: None   Collection Time: 11/02/20  1:04 PM   Specimen: Abdomen; Peritoneal Fluid  Result Value Ref Range Status   Specimen Description FLUID PERITONEAL  Final   Special Requests NONE  Final   Gram Stain   Final    FEW WBC PRESENT, PREDOMINANTLY MONONUCLEAR NO ORGANISMS SEEN Performed at [facility], [address] [place], [place] [address]    Report Status 11/02/2020 FINAL  Final  Culture, body fluid-bottle     Status: None (Preliminary result)   Collection Time: 11/02/20  1:04 PM   Specimen: BLOOD  Result Value Ref Range Status   Specimen Description BLOOD PERITONEAL  Final   Special Requests BOTTLES DRAWN AEROBIC AND ANAEROBIC 10CC  Final   Culture   Final    NO GROWTH 2 DAYS Performed at [facility], [place] [address], [place], [place] [address]    Report Status PENDING  Incomplete  Studies: No results found.  Scheduled Meds: . (feeding supplement) PROSource Plus  30 mL Oral BID BM  . Chlorhexidine Gluconate Cloth  6 each Topical Daily  . feeding supplement  1 Container Oral TID BM  . hydrocortisone sod succinate (SOLU-CORTEF) inj  50 mg Intravenous Q6H  . insulin aspart  0-9 Units Subcutaneous TID WC & HS  . mouth rinse  15 mL Mouth Rinse BID  . metroNIDAZOLE  500 mg Oral Q8H  . midodrine  10 mg Oral TID WC  . multivitamin with minerals  1 tablet Oral Daily  . sodium chloride flush  10-40 mL Intracatheter Q12H  . sodium chloride flush  5 mL Intracatheter Q8H    Continuous Infusions: . sodium chloride 10 mL/hr at 11/01/20 1836  .  cefTRIAXone (ROCEPHIN)  IV 2 g (11/03/20 2300)  . norepinephrine (LEVOPHED) Adult infusion Stopped (11/02/20 1023)     LOS: 8 days     Annita Brod, MD [facility]   11/04/2020, 2:57 PM

## 2020-11-05 DIAGNOSIS — I4891 Unspecified atrial fibrillation: Secondary | ICD-10-CM | POA: Diagnosis not present

## 2020-11-05 DIAGNOSIS — K810 Acute cholecystitis: Secondary | ICD-10-CM | POA: Diagnosis not present

## 2020-11-05 DIAGNOSIS — A414 Sepsis due to anaerobes: Secondary | ICD-10-CM | POA: Diagnosis not present

## 2020-11-05 DIAGNOSIS — N184 Chronic kidney disease, stage 4 (severe): Secondary | ICD-10-CM | POA: Diagnosis not present

## 2020-11-05 LAB — COMPREHENSIVE METABOLIC PANEL
ALT: 8 U/L (ref 0–44)
AST: 15 U/L (ref 15–41)
Albumin: 1.7 g/dL — ABNORMAL LOW (ref 3.5–5.0)
Alkaline Phosphatase: 67 U/L (ref 38–126)
Anion gap: 12 (ref 5–15)
BUN: 57 mg/dL — ABNORMAL HIGH (ref 8–23)
CO2: 20 mmol/L — ABNORMAL LOW (ref 22–32)
Calcium: 7.9 mg/dL — ABNORMAL LOW (ref 8.9–10.3)
Chloride: 111 mmol/L (ref 98–111)
Creatinine, Ser: 2.12 mg/dL — ABNORMAL HIGH (ref 0.44–1.00)
GFR, Estimated: 23 mL/min — ABNORMAL LOW (ref >60)
Glucose, Bld: 246 mg/dL — ABNORMAL HIGH (ref 70–99)
Potassium: 3.3 mmol/L — ABNORMAL LOW (ref 3.5–5.1)
Sodium: 143 mmol/L (ref 135–145)
Total Bilirubin: 0.8 mg/dL (ref 0.3–1.2)
Total Protein: 5.5 g/dL — ABNORMAL LOW (ref 6.5–8.1)

## 2020-11-05 LAB — CBC
HCT: 32 % — ABNORMAL LOW (ref 36.0–46.0)
Hemoglobin: 11.1 g/dL — ABNORMAL LOW (ref 12.0–15.0)
MCH: 29.5 pg (ref 26.0–34.0)
MCHC: 34.7 g/dL (ref 30.0–36.0)
MCV: 85.1 fL (ref 80.0–100.0)
Platelets: UNDETERMINED 10*3/uL (ref 150–400)
RBC: 3.76 MIL/uL — ABNORMAL LOW (ref 3.87–5.11)
RDW: 18.1 % — ABNORMAL HIGH (ref 11.5–15.5)
WBC: 8 10*3/uL (ref 4.0–10.5)
nRBC: 0 % (ref 0.0–0.2)

## 2020-11-05 LAB — PROTIME-INR
INR: 1.7 — ABNORMAL HIGH (ref 0.8–1.2)
Prothrombin Time: 19.1 seconds — ABNORMAL HIGH (ref 11.4–15.2)

## 2020-11-05 LAB — BRAIN NATRIURETIC PEPTIDE: B Natriuretic Peptide: 169.4 pg/mL — ABNORMAL HIGH (ref 0.0–100.0)

## 2020-11-05 LAB — AMMONIA: Ammonia: 39 umol/L — ABNORMAL HIGH (ref 9–35)

## 2020-11-05 LAB — GLUCOSE, CAPILLARY
Glucose-Capillary: 232 mg/dL — ABNORMAL HIGH (ref 70–99)
Glucose-Capillary: 221 mg/dL — ABNORMAL HIGH (ref 70–99)
Glucose-Capillary: 237 mg/dL — ABNORMAL HIGH (ref 70–99)
Glucose-Capillary: 159 mg/dL — ABNORMAL HIGH (ref 70–99)

## 2020-11-05 MED ORDER — HYDROCORTISONE NA SUCCINATE PF 100 MG IJ SOLR
50.0000 mg | Freq: Two times a day (BID) | INTRAMUSCULAR | Status: DC
  Administered 2020-11-05 – 2020-11-06 (×2): 50 mg via INTRAVENOUS
  Filled 2020-11-05: qty 2

## 2020-11-05 MED ORDER — ALBUMIN HUMAN 25 % IV SOLN
12.5000 g | Freq: Once | INTRAVENOUS | Status: AC
  Administered 2020-11-05: 12:00:00 12.5 g via INTRAVENOUS
  Filled 2020-11-05: qty 50

## 2020-11-05 MED ORDER — FUROSEMIDE 10 MG/ML IJ SOLN
20.0000 mg | Freq: Once | INTRAMUSCULAR | Status: AC
  Administered 2020-11-05: 12:00:00 20 mg via INTRAVENOUS
  Filled 2020-11-05: qty 2

## 2020-11-05 NOTE — Progress Notes (Signed)
PROGRESS NOTE  Whitney [name] DPO:[id number] DOB: 1935/01/18 DOA: 11/09/2020 PCP: Patient, No Pcp Per  HPI/Recap of past 45 hours: 85 year old female with past medical history of poorly controlled diabetes mellitus, cirrhosis, stage III chronic kidney disease who had a recent admission for emphysematous cystitis/pyelitis due to ESBL Klebsiella plus status post appendectomy for appendicitis last month who presented to the emergency room on 12/17 with complaints of abdominal pain developing suddenly.  Patient's previous hospitalization course was complicated by recurrent abdominal pain and imaging suggested acute cholecystitis although interpretation was challenging in the setting of her anasarca.  At that time, she has not felt to be a candidate for surgery due to frailty or placement of drainage tube as concerns for long-term infectious complications in the setting of her ascites.  In the emergency room, she was found to be in shock with secondary acute kidney injury, lactic acidosis and, shock suspected to be from sepsis from an abdominal source.  She was given fluids, IV antibiotics and started on pressor support.  CT of abdomen and pelvis noted ascites with suspected secondary peritonitis, bowel wall thickening, cholelithiasis and emphysematous pyelonephritis.    Placed in the ICU to the critical care service.  Urology consulted and it was felt that her ascites were not related to her past UTI and only recommendation was antibiotics also cover possible urinary bacteria if present.  Given comorbidities and past history, palliative care consulted, as they have seen patient in the past, however patient's daughter refused this consultation.  General surgery consulted but did not see a significant inflammation of the intestines or areas of abdomen that would point to source requiring surgery.  It was recommended by all for continued antibiotics, follow-up cultures and see how she progresses.  Patient underwent  a HIDA scan on 12/20 which noted cystic duct obstruction and acute cholecystitis and general surgery followed up recommending percutaneous cholecystostomy tube placement.  Interventional radiology followed up and patient underwent therapeutic paracentesis (with 3.5 L of fluid removed) prior to percutaneous cholecystostomy tube placement on 12/21 without incident.  Fluid cultures grew out Klebsiella and antibiotics changed from broad-spectrum meropenem to ceftriaxone and Flagyl.  Infectious disease recommended that drain should remain indefinitely as well as antibiotics should be 14 days of p.o. therapy after clinical improvement on IV antibiotics.  White blood cell count improved and patient started on clear liquids.  With this improvement, patient transferred to stepdown and to the hospitalist service as of 12/25.    Patient about the same.  Through translator she complains of some pain in her belly.  Lab work noted mild worsening of her renal function and normalization of her white blood cell count.  Assessment/Plan: Principal Problem: Klebsiella septic shock from acute on chronic cholecystitis, present on admission: Patient met criteria with lactic acidosis, gallbladder source, hypotension requiring pressor support: White blood cell count ever so slightly trending upward although still relatively stable.  She has been able to be weaned off of pressors although still on midodrine.  Continue IV antibiotics and as she becomes more stable, can change over to p.o. x14 days.  Given normalization of her white count, will change to p.o.  Weaning down steroids Active Problems:   Uncontrolled diabetes mellitus ([place]): A1c almost 3 months ago at 7.8.  Will likely repeat.  Have been elevated on hydrocortisone, should improve as we taper this off.  Acute kidney injury in the setting of CKD (chronic kidney disease), stage IV ([place]): Creatinine at 2 with GFR of 24.  Not much change during this hospitalization.   Initially mention of stage III chronic kidney disease.  Review of previous records actually indicate closer to a stage IV nephropathy with a GFR of under 30.  Chronic atrial fibrillation: Stable, rate controlled  Malnutrition: Patient at high risk given multiple admissions and poor p.o. intake.  Now that she is being allowed to eat, diet advanced plus boost breeze 3 times daily plus Prosource twice daily plus multivitamin.    Pressure injury of skin: Decubitus ulcers, present on admission.  Patient with air mattress.  Wound care    Other ascites: Chronic.  May benefit from additional paracentesis prior to discharge.  We will try a little diuresis with IV albumin    Obesity (BMI 30-39.9): Meets criteria for BMI greater than 30.    Cirrhosis ([place])  Coagulopathy: Secondary to her cirrhosis.  INR at 1.7  Code Status: Had been DNR in the past, changed to full code by daughter and confirmed.  Patient's daughter does not want palliative care involved.  Family Communication: Updated daughter by phone  Disposition Plan: Unclear.  She has been bedbound for years.  Patient's daughter would like to make sure that she comes home although skilled nursing facility not ruled out completely by daughter.  Active issues: Needs repeat paracentesis. Confirm stability of her renal function. Advancement and tolerating of her diet   Consultants:  Critical Care  Interventional Radiology  Palliative Care (cancelled)  Urology   General Surgery  Procedures: Paracentesis 12/18 Arterial line 12/17 >> 12/21 IR PERC cholecystectomy and para 12/21  Repeat para 12/23  Central line placed 12/18-12/24  Antimicrobials:  IV meropenem 12/17-12/23  IV Flagyl 12/23-present  IV Rocephin 12/23-present  DVT prophylaxis: SCDs   Objective: Vitals:   11/05/20 0900 11/05/20 1135  BP: (!) 134/55 (!) 134/55  Pulse: 75 71  Resp: 18 19  Temp: 97.8 F (36.6 C) 97.6 F (36.4 C)  SpO2: 94% 96%     Intake/Output Summary (Last 24 hours) at 11/05/2020 1317 Last data filed at 11/05/2020 0901 Gross per 24 hour  Intake 325 ml  Output 395 ml  Net -70 ml   Filed Weights   11/03/20 2000 11/04/20 0417 11/05/20 0400  Weight: 75.3 kg 79.4 kg 77.1 kg   Body mass index is 32.12 kg/m.  Exam:   General: Awake, oriented x2, no acute distress secondary to pain  HEENT: Normocephalic and atraumatic, mucous members are dry, poor dentition  Cardiovascular: Irregular rhythm, rate controlled  Respiratory: Decreased breath sounds throughout secondary to body habitus  Abdomen: Soft, distended, nontender, hypoactive bowel sounds  Musculoskeletal: No clubbing or cyanosis, trace pitting edema  Skin: Decubitus ulcers, present on admission  Psychiatry: Appears appropriate, no evidence of psychoses   Data Reviewed: CBC: Recent Labs  Lab 11/01/20 0410 11/02/20 0200 11/03/20 0449 11/04/20 0117 11/05/20 0256  WBC 11.8* 9.1 10.9* 11.4* 8.0  HGB 8.3* 7.6* 9.9* 10.2* 11.1*  HCT 26.9* 22.9* 29.0* 31.1* 32.0*  MCV 91.2 88.4 85.8 86.1 85.1  PLT 153 95* 76* 70* PLATELET CLUMPS NOTED ON SMEAR, UNABLE TO ESTIMATE   Basic Metabolic Panel: Recent Labs  Lab 11/01/20 0410 11/02/20 0200 11/03/20 0449 11/04/20 0117 11/05/20 0256  NA 142 145 144 144 143  K 3.6 3.0* 3.2* 3.2* 3.3*  CL 108 110 111 110 111  CO2 19* 23 22 21* 20*  GLUCOSE 171* 163* 176* 213* 246*  BUN 41* 41* 46* 51* 57*  CREATININE 2.04* 1.86* 1.92* 2.04* 2.12*  CALCIUM  7.8* 7.9* 7.8* 7.6* 7.9*  MG  --   --   --  1.8  --   PHOS  --   --   --  3.8  --    GFR: Estimated Creatinine Clearance: 18.9 mL/min (A) (by C-G formula based on SCr of 2.12 mg/dL (H)). Liver Function Tests: Recent Labs  Lab 11/02/20 0200 11/05/20 0256  AST 18 15  ALT 11 8  ALKPHOS 56 67  BILITOT 1.0 0.8  PROT 4.8* 5.5*  ALBUMIN 2.1* 1.7*   No results for input(s): LIPASE, AMYLASE in the last 168 hours. Recent Labs  Lab 11/05/20 0258   AMMONIA 39*   Coagulation Profile: Recent Labs  Lab 11/01/20 0410 11/02/20 0200 11/05/20 0256  INR 1.4* 1.9* 1.7*   Cardiac Enzymes: No results for input(s): CKTOTAL, CKMB, CKMBINDEX, TROPONINI in the last 168 hours. BNP (last 3 results) No results for input(s): PROBNP in the last 8760 hours. HbA1C: No results for input(s): HGBA1C in the last 72 hours. CBG: Recent Labs  Lab 11/04/20 1209 11/04/20 1645 11/04/20 2151 11/05/20 0645 11/05/20 1134  GLUCAP 204* 189* 233* 232* 221*   Lipid Profile: No results for input(s): CHOL, HDL, LDLCALC, TRIG, CHOLHDL, LDLDIRECT in the last 72 hours. Thyroid Function Tests: No results for input(s): TSH, T4TOTAL, FREET4, T3FREE, THYROIDAB in the last 72 hours. Anemia Panel: No results for input(s): VITAMINB12, FOLATE, FERRITIN, TIBC, IRON, RETICCTPCT in the last 72 hours. Urine analysis:    Component Value Date/Time   COLORURINE YELLOW 10/28/2020 0222   APPEARANCEUR TURBID (A) 10/28/2020 0222   LABSPEC 1.013 10/28/2020 0222   PHURINE 5.0 10/28/2020 0222   GLUCOSEU NEGATIVE 10/28/2020 0222   HGBUR MODERATE (A) 10/28/2020 0222   BILIRUBINUR NEGATIVE 10/28/2020 0222   KETONESUR NEGATIVE 10/28/2020 0222   PROTEINUR 100 (A) 10/28/2020 0222   NITRITE NEGATIVE 10/28/2020 0222   LEUKOCYTESUR LARGE (A) 10/28/2020 0222   Sepsis Labs: @LABRCNTIP (procalcitonin:4,lacticidven:4)  ) Recent Results (from the past 240 hour(s))  Blood culture (routine single)     Status: Abnormal   Collection Time: 10/15/2020  6:14 PM   Specimen: BLOOD  Result Value Ref Range Status   Specimen Description BLOOD RIGHT ANTECUBITAL  Final   Special Requests   Final    BOTTLES DRAWN AEROBIC AND ANAEROBIC Blood Culture adequate volume   Culture  Setup Time   Final    GRAM POSITIVE COCCI IN CLUSTERS AEROBIC BOTTLE ONLY CRITICAL RESULT CALLED TO, READ BACK BY AND VERIFIED WITH: Lea AT 3108 10/28/20 BY L BENFIELD    Culture (A)  Final     STAPHYLOCOCCUS EPIDERMIDIS THE SIGNIFICANCE OF ISOLATING THIS ORGANISM FROM A SINGLE SET OF BLOOD CULTURES WHEN MULTIPLE SETS ARE DRAWN IS UNCERTAIN. PLEASE NOTIFY THE MICROBIOLOGY DEPARTMENT WITHIN ONE WEEK IF SPECIATION AND SENSITIVITIES ARE REQUIRED. Performed at [facility], [place] [address], [place], [place] [address]    Report Status 10/29/2020 FINAL  Final  Blood Culture ID Panel (Reflexed)     Status: Abnormal   Collection Time: 10/28/2020  6:14 PM  Result Value Ref Range Status   Enterococcus faecalis NOT DETECTED NOT DETECTED Final   Enterococcus Faecium NOT DETECTED NOT DETECTED Final   Listeria monocytogenes NOT DETECTED NOT DETECTED Final   Staphylococcus species DETECTED (A) NOT DETECTED Final    Comment: CRITICAL RESULT CALLED TO, READ BACK BY AND VERIFIED WITH: PHARMD M BITONTI AT 2108 10/28/20 BY L BENFIELD    Staphylococcus aureus (BCID) NOT DETECTED NOT DETECTED  Final   Staphylococcus epidermidis DETECTED (A) NOT DETECTED Final    Comment: Methicillin (oxacillin) resistant coagulase negative staphylococcus. Possible blood culture contaminant (unless isolated from more than one blood culture draw or clinical case suggests pathogenicity). No antibiotic treatment is indicated for blood  culture contaminants. CRITICAL RESULT CALLED TO, READ BACK BY AND VERIFIED WITH: PHARMD M BITONTI AT 2108 10/28/20 BY L BENFIELD    Staphylococcus lugdunensis NOT DETECTED NOT DETECTED Final   Streptococcus species NOT DETECTED NOT DETECTED Final   Streptococcus agalactiae NOT DETECTED NOT DETECTED Final   Streptococcus pneumoniae NOT DETECTED NOT DETECTED Final   Streptococcus pyogenes NOT DETECTED NOT DETECTED Final   A.calcoaceticus-baumannii NOT DETECTED NOT DETECTED Final   Bacteroides fragilis NOT DETECTED NOT DETECTED Final   Enterobacterales NOT DETECTED NOT DETECTED Final   Enterobacter cloacae complex NOT DETECTED NOT DETECTED Final   Escherichia coli NOT DETECTED NOT  DETECTED Final   Klebsiella aerogenes NOT DETECTED NOT DETECTED Final   Klebsiella oxytoca NOT DETECTED NOT DETECTED Final   Klebsiella pneumoniae NOT DETECTED NOT DETECTED Final   Proteus species NOT DETECTED NOT DETECTED Final   Salmonella species NOT DETECTED NOT DETECTED Final   Serratia marcescens NOT DETECTED NOT DETECTED Final   Haemophilus influenzae NOT DETECTED NOT DETECTED Final   Neisseria meningitidis NOT DETECTED NOT DETECTED Final   Pseudomonas aeruginosa NOT DETECTED NOT DETECTED Final   Stenotrophomonas maltophilia NOT DETECTED NOT DETECTED Final   Candida albicans NOT DETECTED NOT DETECTED Final   Candida auris NOT DETECTED NOT DETECTED Final   Candida glabrata NOT DETECTED NOT DETECTED Final   Candida krusei NOT DETECTED NOT DETECTED Final   Candida parapsilosis NOT DETECTED NOT DETECTED Final   Candida tropicalis NOT DETECTED NOT DETECTED Final   Cryptococcus neoformans/gattii NOT DETECTED NOT DETECTED Final   Methicillin resistance mecA/C DETECTED (A) NOT DETECTED Final    Comment: CRITICAL RESULT CALLED TO, READ BACK BY AND VERIFIED WITH: Ellin Mayhew BITONTI AT 2108 10/28/20 BY L BENFIELD Performed at [facility], [address], [place], [place] [address]   Resp Panel by RT-PCR (Flu A&B, Covid) Nasopharyngeal Swab     Status: None   Collection Time: 10/26/2020  6:15 PM   Specimen: Nasopharyngeal Swab; Nasopharyngeal(NP) swabs in vial transport medium  Result Value Ref Range Status   SARS Coronavirus 2 by RT PCR NEGATIVE NEGATIVE Final    Comment: (NOTE) SARS-CoV-2 target nucleic acids are NOT DETECTED.  The SARS-CoV-2 RNA is generally detectable in upper respiratory specimens during the acute phase of infection. The lowest concentration of SARS-CoV-2 viral copies this assay can detect is 138 copies/mL. A negative result does not preclude SARS-Cov-2 infection and should not be used as the sole basis for treatment or other patient management decisions. A  negative result may occur with  improper specimen collection/handling, submission of specimen other than nasopharyngeal swab, presence of viral mutation(s) within the areas targeted by this assay, and inadequate number of viral copies(<138 copies/mL). A negative result must be combined with clinical observations, patient history, and epidemiological information. The expected result is Negative.  Fact Sheet for Patients:  EntrepreneurPulse.com.au  Fact Sheet for Healthcare Providers:  IncredibleEmployment.be  This test is no t yet approved or cleared by the [place] FDA and  has been authorized for detection and/or diagnosis of SARS-CoV-2 by FDA under an Emergency Use Authorization (EUA). This EUA will remain  in effect (meaning this test can be used) for the duration of the COVID-19  declaration under Section 564(b)(1) of the Act, 21 U.S.C.section 360bbb-3(b)(1), unless the authorization is terminated  or revoked sooner.       Influenza A by PCR NEGATIVE NEGATIVE Final   Influenza B by PCR NEGATIVE NEGATIVE Final    Comment: (NOTE) The Xpert Xpress SARS-CoV-2/FLU/RSV plus assay is intended as an aid in the diagnosis of influenza from Nasopharyngeal swab specimens and should not be used as a sole basis for treatment. Nasal washings and aspirates are unacceptable for Xpert Xpress SARS-CoV-2/FLU/RSV testing.  Fact Sheet for Patients: EntrepreneurPulse.com.au  Fact Sheet for Healthcare Providers: IncredibleEmployment.be  This test is not yet approved or cleared by the [place] FDA and has been authorized for detection and/or diagnosis of SARS-CoV-2 by FDA under an Emergency Use Authorization (EUA). This EUA will remain in effect (meaning this test can be used) for the duration of the COVID-19 declaration under Section 564(b)(1) of the Act, 21 U.S.C. section 360bbb-3(b)(1), unless the authorization  is terminated or revoked.  Performed at [facility], [place] [address] [place], [place] [address]   Body fluid culture (includes gram stain)     Status: None (Preliminary result)   Collection Time: 11/05/2020 11:38 PM   Specimen: Peritoneal Washings; Peritoneal Fluid  Result Value Ref Range Status   Specimen Description PERITONEAL  Final   Special Requests NONE  Final   Gram Stain   Final    ABUNDANT WBC PRESENT, PREDOMINANTLY PMN NO ORGANISMS SEEN    Culture   Final    RARE KLEBSIELLA PNEUMONIAE Sent to [facility] for further susceptibility testing. Performed at [facility], [place] [address] [place], [place] [address]    Report Status PENDING  Incomplete  Fungus Culture With Stain     Status: None (Preliminary result)   Collection Time: 10/30/2020 11:38 PM   Specimen: Peritoneal; Pleural Fluid  Result Value Ref Range Status   Fungus Stain Final report  Final    Comment: (NOTE) Performed At: [facility] [place], [place] [id number] Rush Farmer MD SV:[id number]    Fungus (Mycology) Culture PENDING  Incomplete   Fungal Source PERITONEAL  Final    Comment: Performed at [facility], [place] [address] [place], [place] [address]  Fungus Culture Result     Status: None   Collection Time: 11/05/2020 11:38 PM  Result Value Ref Range Status   Result 1 Comment  Final    Comment: (NOTE) KOH/Calcofluor preparation:  no fungus observed. Performed At: [facility] [place], [place] [id number] Rush Farmer MD KT:[id number]   MRSA PCR Screening     Status: None   Collection Time: 10/28/20  1:02 AM   Specimen: Nasopharyngeal  Result Value Ref Range Status   MRSA by PCR NEGATIVE NEGATIVE Final    Comment:        The GeneXpert MRSA Assay (FDA approved for NASAL specimens only), is one component of a comprehensive MRSA colonization surveillance program. It is not intended to diagnose MRSA infection nor to guide or monitor  treatment for MRSA infections. Performed at [facility], [place] [address], [place], [place] [address]   Urine culture     Status: Abnormal   Collection Time: 10/28/20  2:22 AM   Specimen: In/Out Cath Urine  Result Value Ref Range Status   Specimen Description IN/OUT CATH URINE  Final   Special Requests   Final    NONE Performed at [facility], [place]  [address] [place], [place] [address]    Culture MULTIPLE SPECIES PRESENT, SUGGEST RECOLLECTION (A)  Final   Report Status 10/29/2020 FINAL  Final  Culture, blood (Routine X 2) w Reflex to ID Panel     Status: None   Collection Time: 10/28/20  9:35 PM   Specimen: BLOOD  Result Value Ref Range Status   Specimen Description BLOOD RIGHT ARM  Final   Special Requests   Final    BOTTLES DRAWN AEROBIC AND ANAEROBIC Blood Culture results may not be optimal due to an inadequate volume of blood received in culture bottles   Culture   Final    NO GROWTH 5 DAYS Performed at [facility], [place] [address], [place], [place] [address]    Report Status 11/02/2020 FINAL  Final  Culture, blood (Routine X 2) w Reflex to ID Panel     Status: None   Collection Time: 10/28/20 10:00 PM   Specimen: BLOOD  Result Value Ref Range Status   Specimen Description BLOOD LEFT HAND  Final   Special Requests   Final    BOTTLES DRAWN AEROBIC AND ANAEROBIC Blood Culture results may not be optimal due to an excessive volume of blood received in culture bottles   Culture   Final    NO GROWTH 5 DAYS Performed at [facility], [place] [address] [place], [place] [address]    Report Status 11/02/2020 FINAL  Final  Aerobic/Anaerobic Culture (surgical/deep wound)     Status: None   Collection Time: 10/31/20  5:22 PM   Specimen: Abscess  Result Value Ref Range Status   Specimen Description ABSCESS GALL BLADDER  Final   Special Requests NONE  Final   Gram Stain   Final    MODERATE WBC PRESENT,BOTH PMN AND MONONUCLEAR FEW GRAM NEGATIVE RODS FEW GRAM  POSITIVE COCCI IN PAIRS IN CHAINS    Culture   Final    ABUNDANT KLEBSIELLA PNEUMONIAE NO ANAEROBES ISOLATED Performed at [facility], [place] [address], [place], [place] [address]    Report Status 11/03/2020 FINAL  Final   Organism ID, Bacteria KLEBSIELLA PNEUMONIAE  Final      Susceptibility   Klebsiella pneumoniae - MIC*    AMPICILLIN >=32 RESISTANT Resistant     CEFAZOLIN <=4 SENSITIVE Sensitive     CEFEPIME <=0.12 SENSITIVE Sensitive     CEFTAZIDIME <=1 SENSITIVE Sensitive     CEFTRIAXONE <=0.25 SENSITIVE Sensitive     CIPROFLOXACIN <=0.25 SENSITIVE Sensitive     GENTAMICIN <=1 SENSITIVE Sensitive     IMIPENEM <=0.25 SENSITIVE Sensitive     TRIMETH/SULFA <=20 SENSITIVE Sensitive     AMPICILLIN/SULBACTAM 4 SENSITIVE Sensitive     PIP/TAZO <=4 SENSITIVE Sensitive     * ABUNDANT KLEBSIELLA PNEUMONIAE  Gram stain     Status: None   Collection Time: 11/02/20  1:04 PM   Specimen: Abdomen; Peritoneal Fluid  Result Value Ref Range Status   Specimen Description FLUID PERITONEAL  Final   Special Requests NONE  Final   Gram Stain   Final    FEW WBC PRESENT, PREDOMINANTLY MONONUCLEAR NO ORGANISMS SEEN Performed at [facility], [address] [place],  [address]    Report Status 11/02/2020 FINAL  Final  Culture, body fluid-bottle     Status: None (Preliminary result)   Collection Time: 11/02/20  1:04 PM   Specimen: BLOOD  Result Value Ref Range Status   Specimen Description BLOOD PERITONEAL  Final   Special  Requests BOTTLES DRAWN AEROBIC AND ANAEROBIC 10CC  Final   Culture   Final    NO GROWTH 2 DAYS Performed at [facility], [place] [address] [place], [place] [address]    Report Status PENDING  Incomplete      Studies: No results found.  Scheduled Meds: . (feeding supplement) PROSource Plus  30 mL Oral BID BM  . Chlorhexidine Gluconate Cloth  6 each Topical Daily  . feeding supplement  1 Container Oral TID BM  . hydrocortisone sod succinate  (SOLU-CORTEF) inj  50 mg Intravenous Q12H  . insulin aspart  0-9 Units Subcutaneous TID WC & HS  . mouth rinse  15 mL Mouth Rinse BID  . metroNIDAZOLE  500 mg Oral Q8H  . midodrine  10 mg Oral TID WC  . multivitamin with minerals  1 tablet Oral Daily  . sodium chloride flush  10-40 mL Intracatheter Q12H  . sodium chloride flush  5 mL Intracatheter Q8H    Continuous Infusions: . sodium chloride 10 mL/hr at 11/01/20 1836  . cefTRIAXone (ROCEPHIN)  IV 2 g (11/04/20 2204)     LOS: 9 days     Annita Brod, MD [facility]   11/05/2020, 1:17 PM

## 2020-11-05 NOTE — Plan of Care (Signed)

## 2020-11-06 DIAGNOSIS — N179 Acute kidney failure, unspecified: Secondary | ICD-10-CM

## 2020-11-06 DIAGNOSIS — E43 Unspecified severe protein-calorie malnutrition: Secondary | ICD-10-CM

## 2020-11-06 DIAGNOSIS — I4891 Unspecified atrial fibrillation: Secondary | ICD-10-CM | POA: Diagnosis not present

## 2020-11-06 DIAGNOSIS — A414 Sepsis due to anaerobes: Secondary | ICD-10-CM | POA: Diagnosis not present

## 2020-11-06 DIAGNOSIS — E669 Obesity, unspecified: Secondary | ICD-10-CM

## 2020-11-06 DIAGNOSIS — R188 Other ascites: Secondary | ICD-10-CM | POA: Diagnosis not present

## 2020-11-06 LAB — COMPREHENSIVE METABOLIC PANEL
ALT: 7 U/L (ref 0–44)
AST: 15 U/L (ref 15–41)
Albumin: 1.7 g/dL — ABNORMAL LOW (ref 3.5–5.0)
Alkaline Phosphatase: 69 U/L (ref 38–126)
Anion gap: 11 (ref 5–15)
BUN: 63 mg/dL — ABNORMAL HIGH (ref 8–23)
CO2: 20 mmol/L — ABNORMAL LOW (ref 22–32)
Calcium: 7.8 mg/dL — ABNORMAL LOW (ref 8.9–10.3)
Chloride: 113 mmol/L — ABNORMAL HIGH (ref 98–111)
Creatinine, Ser: 2.24 mg/dL — ABNORMAL HIGH (ref 0.44–1.00)
GFR, Estimated: 21 mL/min — ABNORMAL LOW (ref >60)
Glucose, Bld: 290 mg/dL — ABNORMAL HIGH (ref 70–99)
Potassium: 2.7 mmol/L — CL (ref 3.5–5.1)
Sodium: 144 mmol/L (ref 135–145)
Total Bilirubin: 0.4 mg/dL (ref 0.3–1.2)
Total Protein: 5 g/dL — ABNORMAL LOW (ref 6.5–8.1)

## 2020-11-06 LAB — GLUCOSE, CAPILLARY
Glucose-Capillary: 250 mg/dL — ABNORMAL HIGH (ref 70–99)
Glucose-Capillary: 248 mg/dL — ABNORMAL HIGH (ref 70–99)
Glucose-Capillary: 293 mg/dL — ABNORMAL HIGH (ref 70–99)
Glucose-Capillary: 261 mg/dL — ABNORMAL HIGH (ref 70–99)

## 2020-11-06 LAB — CBC
HCT: 34.2 % — ABNORMAL LOW (ref 36.0–46.0)
Hemoglobin: 11.4 g/dL — ABNORMAL LOW (ref 12.0–15.0)
MCH: 29.2 pg (ref 26.0–34.0)
MCHC: 33.3 g/dL (ref 30.0–36.0)
MCV: 87.7 fL (ref 80.0–100.0)
Platelets: 94 10*3/uL — ABNORMAL LOW (ref 150–400)
RBC: 3.9 MIL/uL (ref 3.87–5.11)
RDW: 18.6 % — ABNORMAL HIGH (ref 11.5–15.5)
WBC: 13.3 10*3/uL — ABNORMAL HIGH (ref 4.0–10.5)
nRBC: 0 % (ref 0.0–0.2)

## 2020-11-06 MED ORDER — HYDROCORTISONE NA SUCCINATE PF 100 MG IJ SOLR
25.0000 mg | Freq: Two times a day (BID) | INTRAMUSCULAR | Status: AC
  Administered 2020-11-06 – 2020-11-07 (×2): 25 mg via INTRAVENOUS
  Filled 2020-11-06 (×3): qty 2

## 2020-11-06 MED ORDER — POTASSIUM CHLORIDE 10 MEQ/100ML IV SOLN
10.0000 meq | INTRAVENOUS | Status: AC
  Administered 2020-11-06 (×2): 10 meq via INTRAVENOUS
  Filled 2020-11-06 (×3): qty 100

## 2020-11-06 MED ORDER — POTASSIUM CHLORIDE 10 MEQ/100ML IV SOLN
10.0000 meq | Freq: Once | INTRAVENOUS | Status: AC
  Administered 2020-11-06: 11:00:00 10 meq via INTRAVENOUS
  Filled 2020-11-06: qty 100

## 2020-11-06 MED ORDER — POTASSIUM CHLORIDE 10 MEQ/100ML IV SOLN
INTRAVENOUS | Status: AC
  Administered 2020-11-06: 14:00:00 10 meq via INTRAVENOUS
  Filled 2020-11-06: qty 100

## 2020-11-06 NOTE — Progress Notes (Signed)
Text page [facility] critical lab Potassium 2.7 awaiting return calll.

## 2020-11-06 NOTE — Progress Notes (Signed)
Inpatient Diabetes Program Recommendations  AACE/ADA: New Consensus Statement on Inpatient Glycemic Control (2015)  Target Ranges:  Prepandial:   less than 140 mg/dL      Peak postprandial:   less than 180 mg/dL (1-2 hours)      Critically ill patients:  140 - 180 mg/dL   Lab Results  Component Value Date   GLUCAP 248 (H) 11/06/2020   HGBA1C 7.8 (H) 08/18/2020    Review of Glycemic Control Results for Whitney [name], Whitney [name] (MRN [id number]) as of 11/06/2020 13:00  Ref. Range 11/05/2020 11:34 11/05/2020 16:55 11/05/2020 22:26 11/06/2020 06:00 11/06/2020 12:28  Glucose-Capillary Latest Ref Range: 70 - 99 mg/dL 221 (H) 237 (H) 159 (H) 250 (H) 248 (H)   Diabetes history:  DM 2 Outpatient Diabetes medications:  Lantus 6 units q HS Current orders for Inpatient glycemic control:  Novolog sensitive tid with meals and HS Solucortef 25 mg IV q 12 hours Inpatient Diabetes Program Recommendations:   Please consider adding home dose of Lantus 6 units daily.   Thanks  Adah Perl, RN, BC-ADM Inpatient Diabetes Coordinator Pager [phone] (8a-5p)

## 2020-11-06 NOTE — Progress Notes (Signed)
Patient refused all medications, breakfast, nutritional drinks. Lunch and juice.   Multiple attempts by this nurse with patient yelling ...No!..No!...No! and shaking her head left to right.  Pt then grabs blanket and covers her head. NT attempted breakfast and lunch meals as well with the same response, No!

## 2020-11-06 NOTE — Progress Notes (Signed)
PROGRESS NOTE  Whitney [name] OZY:[id number] DOB: 12-Jun-1935 DOA: 11/03/2020 PCP: Patient, No Pcp Per  HPI/Recap of past 60 hours: 85 year old female with past medical history of poorly controlled diabetes mellitus, cirrhosis, stage III chronic kidney disease who had a recent admission for emphysematous cystitis/pyelitis due to ESBL Klebsiella plus status post appendectomy for appendicitis last month who presented to the emergency room on 12/17 with complaints of abdominal pain developing suddenly.  Patient's previous hospitalization course was complicated by recurrent abdominal pain and imaging suggested acute cholecystitis although interpretation was challenging in the setting of her anasarca.  At that time, she has not felt to be a candidate for surgery due to frailty or placement of drainage tube as concerns for long-term infectious complications in the setting of her ascites.  In the emergency room, she was found to be in shock with secondary acute kidney injury, lactic acidosis and, shock suspected to be from sepsis from an abdominal source.  She was given fluids, IV antibiotics and started on pressor support.  CT of abdomen and pelvis noted ascites with suspected secondary peritonitis, bowel wall thickening, cholelithiasis and emphysematous pyelonephritis.    Placed in the ICU to the critical care service.  Urology consulted and it was felt that her ascites were not related to her past UTI and only recommendation was antibiotics also cover possible urinary bacteria if present.  Given comorbidities and past history, palliative care consulted, as they have seen patient in the past, however patient's daughter refused this consultation.  General surgery consulted but did not see a significant inflammation of the intestines or areas of abdomen that would point to source requiring surgery.  It was recommended by all for continued antibiotics, follow-up cultures and see how she progresses.  Patient underwent  a HIDA scan on 12/20 which noted cystic duct obstruction and acute cholecystitis and general surgery followed up recommending percutaneous cholecystostomy tube placement.  Interventional radiology followed up and patient underwent therapeutic paracentesis (with 3.5 L of fluid removed) prior to percutaneous cholecystostomy tube placement on 12/21 without incident.  Fluid cultures grew out Klebsiella and antibiotics changed from broad-spectrum meropenem to ceftriaxone and Flagyl.  Infectious disease recommended that drain should remain indefinitely as well as antibiotics should be 14 days of p.o. therapy after clinical improvement on IV antibiotics.  White blood cell count improved and patient started on clear liquids.  With this improvement, patient transferred to stepdown and to the hospitalist service as of 12/25.    Today, patient's creatinine slightly increased, likely in response to attempting to give her low-dose Lasix x1 even with albumin.  White count up slightly as well.  Has been tapering off her steroids.  Patient about the same, with complaints of occasional abdominal pain.  Assessment/Plan: Principal Problem: Klebsiella septic shock from acute on chronic cholecystitis, present on admission: Patient met criteria with lactic acidosis, gallbladder source, hypotension requiring pressor support: White blood cell count ever so slightly trending upward although still relatively stable.  She has been able to be weaned off of pressors although still on midodrine.  Continue IV antibiotics and as she becomes more stable, can change over to p.o. x14 days.  Was hoping to change her antibiotics to p.o., but with worsening white blood cell count, need to wait a little longer.  Steroids weaned off as of tomorrow. Active Problems:   Uncontrolled diabetes mellitus ([place]): A1c almost 3 months ago at 7.8.  Will likely repeat.  Have been elevated on hydrocortisone, should improve as we  taper this off.  Acute  kidney injury in the setting of CKD (chronic kidney disease), stage IV ([place]): Creatinine at 2 with GFR of 24.  Not much change during this hospitalization.  Initially mention of stage III chronic kidney disease.  Review of previous records actually indicate closer to a stage IV nephropathy with a GFR of under 30.  A little worse today although this is in the context of giving her a dose of Lasix plus albumin  Chronic atrial fibrillation: Stable, rate controlled  Malnutrition: Patient at high risk given multiple admissions and poor p.o. intake.  Now that she is being allowed to eat, diet advanced plus boost breeze 3 times daily plus Prosource twice daily plus multivitamin.    Pressure injury of skin: Decubitus ulcers, present on admission.  Patient with air mattress.  Wound care    Other ascites: Chronic.  May benefit from additional paracentesis prior to discharge.  We will try a little diuresis with IV albumin.  Will ask IR about repeat paracentesis.    Obesity (BMI 30-39.9): Meets criteria for BMI greater than 30.    Cirrhosis ([place])  Coagulopathy: Secondary to her cirrhosis.  INR at 1.7  Code Status: Had been DNR in the past, changed to full code by daughter and confirmed.  Patient's daughter does not want palliative care involved.  Family Communication: Updated daughter by phone  Disposition Plan: Unclear.  She has been bedbound for years.  Patient's daughter would like to make sure that she comes home although skilled nursing facility not ruled out completely by daughter.  Active issues: Needs repeat paracentesis. Confirm stability of her renal function. Advancement and tolerating of her diet   Consultants:  Critical Care  Interventional Radiology  Palliative Care (cancelled)  Urology   General Surgery  Procedures: Paracentesis 12/18 Arterial line 12/17 >> 12/21 IR PERC cholecystectomy and para 12/21  Repeat para 12/23  Central line placed  12/18-12/24  Antimicrobials:  IV meropenem 12/17-12/23  IV Flagyl 12/23-present  IV Rocephin 12/23-present  DVT prophylaxis: SCDs   Objective: Vitals:   11/06/20 0400 11/06/20 0800  BP: 118/63 115/69  Pulse: 87 84  Resp: 18 18  Temp: (!) 97.4 F (36.3 C) (!) 97.3 F (36.3 C)  SpO2: 95% 97%    Intake/Output Summary (Last 24 hours) at 11/06/2020 1008 Last data filed at 11/06/2020 2951 Gross per 24 hour  Intake 550 ml  Output 240 ml  Net 310 ml   Filed Weights   11/04/20 0417 11/05/20 0400  Weight: 79.4 kg 77.1 kg   Body mass index is 32.12 kg/m.  Exam:   General: Awake, oriented x2, no acute distress secondary to pain  HEENT: Normocephalic and atraumatic, mucous members are dry, poor dentition  Cardiovascular: Irregular rhythm, rate controlled  Respiratory: Decreased breath sounds throughout secondary to body habitus  Abdomen: Soft, distended, nontender, hypoactive bowel sounds  Musculoskeletal: No clubbing or cyanosis, trace pitting edema  Skin: Decubitus ulcers, present on admission  Psychiatry: Appears appropriate, no evidence of psychoses   Data Reviewed: CBC: Recent Labs  Lab 11/02/20 0200 11/03/20 0449 11/04/20 0117 11/05/20 0256 11/06/20 0141  WBC 9.1 10.9* 11.4* 8.0 13.3*  HGB 7.6* 9.9* 10.2* 11.1* 11.4*  HCT 22.9* 29.0* 31.1* 32.0* 34.2*  MCV 88.4 85.8 86.1 85.1 87.7  PLT 95* 76* 70* PLATELET CLUMPS NOTED ON SMEAR, UNABLE TO ESTIMATE 94*   Basic Metabolic Panel: Recent Labs  Lab 11/02/20 0200 11/03/20 0449 11/04/20 0117 11/05/20 0256 11/06/20 0141  NA  145 144 144 143 144  K 3.0* 3.2* 3.2* 3.3* 2.7*  CL 110 111 110 111 113*  CO2 23 22 21* 20* 20*  GLUCOSE 163* 176* 213* 246* 290*  BUN 41* 46* 51* 57* 63*  CREATININE 1.86* 1.92* 2.04* 2.12* 2.24*  CALCIUM 7.9* 7.8* 7.6* 7.9* 7.8*  MG  --   --  1.8  --   --   PHOS  --   --  3.8  --   --    GFR: Estimated Creatinine Clearance: 17.9 mL/min (A) (by C-G formula based on  SCr of 2.24 mg/dL (H)). Liver Function Tests: Recent Labs  Lab 11/02/20 0200 11/05/20 0256 11/06/20 0141  AST 18 15 15   ALT 11 8 7   ALKPHOS 56 67 69  BILITOT 1.0 0.8 0.4  PROT 4.8* 5.5* 5.0*  ALBUMIN 2.1* 1.7* 1.7*   No results for input(s): LIPASE, AMYLASE in the last 168 hours. Recent Labs  Lab 11/05/20 0258  AMMONIA 39*   Coagulation Profile: Recent Labs  Lab 11/01/20 0410 11/02/20 0200 11/05/20 0256  INR 1.4* 1.9* 1.7*   Cardiac Enzymes: No results for input(s): CKTOTAL, CKMB, CKMBINDEX, TROPONINI in the last 168 hours. BNP (last 3 results) No results for input(s): PROBNP in the last 8760 hours. HbA1C: No results for input(s): HGBA1C in the last 72 hours. CBG: Recent Labs  Lab 11/05/20 0645 11/05/20 1134 11/05/20 1655 11/05/20 2226 11/06/20 0600  GLUCAP 232* 221* 237* 159* 250*   Lipid Profile: No results for input(s): CHOL, HDL, LDLCALC, TRIG, CHOLHDL, LDLDIRECT in the last 72 hours. Thyroid Function Tests: No results for input(s): TSH, T4TOTAL, FREET4, T3FREE, THYROIDAB in the last 72 hours. Anemia Panel: No results for input(s): VITAMINB12, FOLATE, FERRITIN, TIBC, IRON, RETICCTPCT in the last 72 hours. Urine analysis:    Component Value Date/Time   COLORURINE YELLOW 10/28/2020 0222   APPEARANCEUR TURBID (A) 10/28/2020 0222   LABSPEC 1.013 10/28/2020 0222   PHURINE 5.0 10/28/2020 0222   GLUCOSEU NEGATIVE 10/28/2020 0222   HGBUR MODERATE (A) 10/28/2020 0222   BILIRUBINUR NEGATIVE 10/28/2020 0222   KETONESUR NEGATIVE 10/28/2020 0222   PROTEINUR 100 (A) 10/28/2020 0222   NITRITE NEGATIVE 10/28/2020 0222   LEUKOCYTESUR LARGE (A) 10/28/2020 0222   Sepsis Labs: @LABRCNTIP (procalcitonin:4,lacticidven:4)  ) Recent Results (from the past 240 hour(s))  Blood culture (routine single)     Status: Abnormal   Collection Time: 11/03/2020  6:14 PM   Specimen: BLOOD  Result Value Ref Range Status   Specimen Description BLOOD RIGHT ANTECUBITAL  Final    Special Requests   Final    BOTTLES DRAWN AEROBIC AND ANAEROBIC Blood Culture adequate volume   Culture  Setup Time   Final    GRAM POSITIVE COCCI IN CLUSTERS AEROBIC BOTTLE ONLY CRITICAL RESULT CALLED TO, READ BACK BY AND VERIFIED WITH: Payne AT 3108 10/28/20 BY L BENFIELD    Culture (A)  Final    STAPHYLOCOCCUS EPIDERMIDIS THE SIGNIFICANCE OF ISOLATING THIS ORGANISM FROM A SINGLE SET OF BLOOD CULTURES WHEN MULTIPLE SETS ARE DRAWN IS UNCERTAIN. PLEASE NOTIFY THE MICROBIOLOGY DEPARTMENT WITHIN ONE WEEK IF SPECIATION AND SENSITIVITIES ARE REQUIRED. Performed at [facility], [place] [address], [place], [place] [address]    Report Status 10/29/2020 FINAL  Final  Blood Culture ID Panel (Reflexed)     Status: Abnormal   Collection Time: 10/19/2020  6:14 PM  Result Value Ref Range Status   Enterococcus faecalis NOT DETECTED NOT DETECTED Final   Enterococcus  Faecium NOT DETECTED NOT DETECTED Final   Listeria monocytogenes NOT DETECTED NOT DETECTED Final   Staphylococcus species DETECTED (A) NOT DETECTED Final    Comment: CRITICAL RESULT CALLED TO, READ BACK BY AND VERIFIED WITH: PHARMD M BITONTI AT 2108 10/28/20 BY L BENFIELD    Staphylococcus aureus (BCID) NOT DETECTED NOT DETECTED Final   Staphylococcus epidermidis DETECTED (A) NOT DETECTED Final    Comment: Methicillin (oxacillin) resistant coagulase negative staphylococcus. Possible blood culture contaminant (unless isolated from more than one blood culture draw or clinical case suggests pathogenicity). No antibiotic treatment is indicated for blood  culture contaminants. CRITICAL RESULT CALLED TO, READ BACK BY AND VERIFIED WITH: PHARMD M BITONTI AT 2108 10/28/20 BY L BENFIELD    Staphylococcus lugdunensis NOT DETECTED NOT DETECTED Final   Streptococcus species NOT DETECTED NOT DETECTED Final   Streptococcus agalactiae NOT DETECTED NOT DETECTED Final   Streptococcus pneumoniae NOT DETECTED NOT DETECTED Final    Streptococcus pyogenes NOT DETECTED NOT DETECTED Final   A.calcoaceticus-baumannii NOT DETECTED NOT DETECTED Final   Bacteroides fragilis NOT DETECTED NOT DETECTED Final   Enterobacterales NOT DETECTED NOT DETECTED Final   Enterobacter cloacae complex NOT DETECTED NOT DETECTED Final   Escherichia coli NOT DETECTED NOT DETECTED Final   Klebsiella aerogenes NOT DETECTED NOT DETECTED Final   Klebsiella oxytoca NOT DETECTED NOT DETECTED Final   Klebsiella pneumoniae NOT DETECTED NOT DETECTED Final   Proteus species NOT DETECTED NOT DETECTED Final   Salmonella species NOT DETECTED NOT DETECTED Final   Serratia marcescens NOT DETECTED NOT DETECTED Final   Haemophilus influenzae NOT DETECTED NOT DETECTED Final   Neisseria meningitidis NOT DETECTED NOT DETECTED Final   Pseudomonas aeruginosa NOT DETECTED NOT DETECTED Final   Stenotrophomonas maltophilia NOT DETECTED NOT DETECTED Final   Candida albicans NOT DETECTED NOT DETECTED Final   Candida auris NOT DETECTED NOT DETECTED Final   Candida glabrata NOT DETECTED NOT DETECTED Final   Candida krusei NOT DETECTED NOT DETECTED Final   Candida parapsilosis NOT DETECTED NOT DETECTED Final   Candida tropicalis NOT DETECTED NOT DETECTED Final   Cryptococcus neoformans/gattii NOT DETECTED NOT DETECTED Final   Methicillin resistance mecA/C DETECTED (A) NOT DETECTED Final    Comment: CRITICAL RESULT CALLED TO, READ BACK BY AND VERIFIED WITH: Ellin Mayhew BITONTI AT 2108 10/28/20 BY L BENFIELD Performed at [facility], [address], [place], [place] [address]   Resp Panel by RT-PCR (Flu A&B, Covid) Nasopharyngeal Swab     Status: None   Collection Time: 11/04/2020  6:15 PM   Specimen: Nasopharyngeal Swab; Nasopharyngeal(NP) swabs in vial transport medium  Result Value Ref Range Status   SARS Coronavirus 2 by RT PCR NEGATIVE NEGATIVE Final    Comment: (NOTE) SARS-CoV-2 target nucleic acids are NOT DETECTED.  The SARS-CoV-2 RNA is generally  detectable in upper respiratory specimens during the acute phase of infection. The lowest concentration of SARS-CoV-2 viral copies this assay can detect is 138 copies/mL. A negative result does not preclude SARS-Cov-2 infection and should not be used as the sole basis for treatment or other patient management decisions. A negative result may occur with  improper specimen collection/handling, submission of specimen other than nasopharyngeal swab, presence of viral mutation(s) within the areas targeted by this assay, and inadequate number of viral copies(<138 copies/mL). A negative result must be combined with clinical observations, patient history, and epidemiological information. The expected result is Negative.  Fact Sheet for Patients:  EntrepreneurPulse.com.au  Fact Sheet for  Healthcare Providers:  IncredibleEmployment.be  This test is no t yet approved or cleared by the [place] and  has been authorized for detection and/or diagnosis of SARS-CoV-2 by FDA under an Emergency Use Authorization (EUA). This EUA will remain  in effect (meaning this test can be used) for the duration of the COVID-19 declaration under Section 564(b)(1) of the Act, 21 U.S.C.section 360bbb-3(b)(1), unless the authorization is terminated  or revoked sooner.       Influenza A by PCR NEGATIVE NEGATIVE Final   Influenza B by PCR NEGATIVE NEGATIVE Final    Comment: (NOTE) The Xpert Xpress SARS-CoV-2/FLU/RSV plus assay is intended as an aid in the diagnosis of influenza from Nasopharyngeal swab specimens and should not be used as a sole basis for treatment. Nasal washings and aspirates are unacceptable for Xpert Xpress SARS-CoV-2/FLU/RSV testing.  Fact Sheet for Patients: EntrepreneurPulse.com.au  Fact Sheet for Healthcare Providers: IncredibleEmployment.be  This test is not yet approved or cleared by the [place] FDA  and has been authorized for detection and/or diagnosis of SARS-CoV-2 by FDA under an Emergency Use Authorization (EUA). This EUA will remain in effect (meaning this test can be used) for the duration of the COVID-19 declaration under Section 564(b)(1) of the Act, 21 U.S.C. section 360bbb-3(b)(1), unless the authorization is terminated or revoked.  Performed at [facility], [place] [address], [place], [place] [address]   Body fluid culture (includes gram stain)     Status: None (Preliminary result)   Collection Time: 10/29/2020 11:38 PM   Specimen: Peritoneal Washings; Peritoneal Fluid  Result Value Ref Range Status   Specimen Description PERITONEAL  Final   Special Requests NONE  Final   Gram Stain   Final    ABUNDANT WBC PRESENT, PREDOMINANTLY PMN NO ORGANISMS SEEN    Culture   Final    RARE KLEBSIELLA PNEUMONIAE Sent to [place] for further susceptibility testing. Performed at [facility], [place] [address], [place], [place] [address]    Report Status PENDING  Incomplete  Fungus Culture With Stain     Status: None (Preliminary result)   Collection Time: 11/07/2020 11:38 PM   Specimen: Peritoneal; Pleural Fluid  Result Value Ref Range Status   Fungus Stain Final report  Final    Comment: (NOTE) Performed At: [facility] [place], [place] [id number] Rush Farmer MD ON:[id number]    Fungus (Mycology) Culture PENDING  Incomplete   Fungal Source PERITONEAL  Final    Comment: Performed at [facility], [place] [address] [place], [place] [address]  Fungus Culture Result     Status: None   Collection Time: 10/13/2020 11:38 PM  Result Value Ref Range Status   Result 1 Comment  Final    Comment: (NOTE) KOH/Calcofluor preparation:  no fungus observed. Performed At: [facility] [address] [place], [place] [id number] Rush Farmer MD IH:[id number]   Susceptibility, Aer + Anaerob     Status: Abnormal   Collection Time: 10/30/2020 11:38  PM  Result Value Ref Range Status   Suscept, Aer + Anaerob Preliminary report (A)  Final    Comment: (NOTE) Performed At: [facility] [place], [place] [id number] Rush Farmer MD JO:[id number]    Source of Sample   Final    [phone] PERITONEAL FLUID KLEBSIELLA PNEUMONIAE SENSESITIVITES    Comment: Performed at [facility], [place] [address], [place], [place] [address]  Susceptibility Result     Status: Abnormal  Collection Time: 10/17/2020 11:38 PM  Result Value Ref Range Status   Suscept Result 1 Klebsiella pneumoniae (A)  Final    Comment: (NOTE) Identification performed by account, not confirmed by this laboratory. Performed At: [facility] [place], [place] [id number] Rush Farmer MD YQ:[id number]   MRSA PCR Screening     Status: None   Collection Time: 10/28/20  1:02 AM   Specimen: Nasopharyngeal  Result Value Ref Range Status   MRSA by PCR NEGATIVE NEGATIVE Final    Comment:        The GeneXpert MRSA Assay (FDA approved for NASAL specimens only), is one component of a comprehensive MRSA colonization surveillance program. It is not intended to diagnose MRSA infection nor to guide or monitor treatment for MRSA infections. Performed at [facility], [place] [address] [place], [place] [address]   Urine culture     Status: Abnormal   Collection Time: 10/28/20  2:22 AM   Specimen: In/Out Cath Urine  Result Value Ref Range Status   Specimen Description IN/OUT CATH URINE  Final   Special Requests   Final    NONE Performed at [facility], [place] [address] [place], [place] [address]    Culture MULTIPLE SPECIES PRESENT, SUGGEST RECOLLECTION (A)  Final   Report Status 10/29/2020 FINAL  Final  Culture, blood (Routine X 2) w Reflex to ID Panel     Status: None   Collection Time: 10/28/20  9:35 PM   Specimen: BLOOD  Result Value Ref Range Status   Specimen Description BLOOD RIGHT ARM  Final   Special Requests    Final    BOTTLES DRAWN AEROBIC AND ANAEROBIC Blood Culture results may not be optimal due to an inadequate volume of blood received in culture bottles   Culture   Final    NO GROWTH 5 DAYS Performed at [facility], [place] [address] [place], [place] [address]    Report Status 11/02/2020 FINAL  Final  Culture, blood (Routine X 2) w Reflex to ID Panel     Status: None   Collection Time: 10/28/20 10:00 PM   Specimen: BLOOD  Result Value Ref Range Status   Specimen Description BLOOD LEFT HAND  Final   Special Requests   Final    BOTTLES DRAWN AEROBIC AND ANAEROBIC Blood Culture results may not be optimal due to an excessive volume of blood received in culture bottles   Culture   Final    NO GROWTH 5 DAYS Performed at [facility], [place] [address], [place], [place] [address]    Report Status 11/02/2020 FINAL  Final  Aerobic/Anaerobic Culture (surgical/deep wound)     Status: None   Collection Time: 10/31/20  5:22 PM   Specimen: Abscess  Result Value Ref Range Status   Specimen Description ABSCESS GALL BLADDER  Final   Special Requests NONE  Final   Gram Stain   Final    MODERATE WBC PRESENT,BOTH PMN AND MONONUCLEAR FEW GRAM NEGATIVE RODS FEW GRAM POSITIVE COCCI IN PAIRS IN CHAINS    Culture   Final    ABUNDANT KLEBSIELLA PNEUMONIAE NO ANAEROBES ISOLATED Performed at [facility], [place] [address], [place], [place] [address]    Report Status 11/03/2020 FINAL  Final   Organism ID, Bacteria KLEBSIELLA PNEUMONIAE  Final      Susceptibility   Klebsiella pneumoniae - MIC*    AMPICILLIN >=32 RESISTANT Resistant     CEFAZOLIN <=4  SENSITIVE Sensitive     CEFEPIME <=0.12 SENSITIVE Sensitive     CEFTAZIDIME <=1 SENSITIVE Sensitive     CEFTRIAXONE <=0.25 SENSITIVE Sensitive     CIPROFLOXACIN <=0.25 SENSITIVE Sensitive     GENTAMICIN <=1 SENSITIVE Sensitive     IMIPENEM <=0.25 SENSITIVE Sensitive     TRIMETH/SULFA <=20 SENSITIVE Sensitive     AMPICILLIN/SULBACTAM 4  SENSITIVE Sensitive     PIP/TAZO <=4 SENSITIVE Sensitive     * ABUNDANT KLEBSIELLA PNEUMONIAE  Gram stain     Status: None   Collection Time: 11/02/20  1:04 PM   Specimen: Abdomen; Peritoneal Fluid  Result Value Ref Range Status   Specimen Description FLUID PERITONEAL  Final   Special Requests NONE  Final   Gram Stain   Final    FEW WBC PRESENT, PREDOMINANTLY MONONUCLEAR NO ORGANISMS SEEN Performed at [facility], [place] [address], [place], [place] [address]    Report Status 11/02/2020 FINAL  Final  Culture, body fluid-bottle     Status: None (Preliminary result)   Collection Time: 11/02/20  1:04 PM   Specimen: BLOOD  Result Value Ref Range Status   Specimen Description BLOOD PERITONEAL  Final   Special Requests BOTTLES DRAWN AEROBIC AND ANAEROBIC 10CC  Final   Culture   Final    NO GROWTH 3 DAYS Performed at [facility], [place] [address] [place], [place] [address]    Report Status PENDING  Incomplete      Studies: No results found.  Scheduled Meds: . (feeding supplement) PROSource Plus  30 mL Oral BID BM  . Chlorhexidine Gluconate Cloth  6 each Topical Daily  . feeding supplement  1 Container Oral TID BM  . hydrocortisone sod succinate (SOLU-CORTEF) inj  25 mg Intravenous Q12H  . insulin aspart  0-9 Units Subcutaneous TID WC & HS  . mouth rinse  15 mL Mouth Rinse BID  . metroNIDAZOLE  500 mg Oral Q8H  . midodrine  10 mg Oral TID WC  . multivitamin with minerals  1 tablet Oral Daily  . sodium chloride flush  10-40 mL Intracatheter Q12H  . sodium chloride flush  5 mL Intracatheter Q8H    Continuous Infusions: . sodium chloride 10 mL/hr at 11/01/20 1836  . cefTRIAXone (ROCEPHIN)  IV 2 g (11/05/20 2216)  . potassium chloride       LOS: 10 days     Annita Brod, MD [facility]   11/06/2020, 10:08 AM

## 2020-11-06 NOTE — Plan of Care (Signed)
  Problem: Education: Goal: Knowledge of General Education information will improve Description: Including pain rating scale, medication(s)/side effects and non-pharmacologic comfort measures Outcome: Progressing   Problem: Health Behavior/Discharge Planning: Goal: Ability to manage health-related needs will improve Outcome: Progressing   Problem: Clinical Measurements: Goal: Ability to maintain clinical measurements within normal limits will improve Outcome: Progressing Goal: Will remain free from infection Outcome: Progressing Goal: Diagnostic test results will improve Outcome: Progressing Goal: Respiratory complications will improve Outcome: Progressing Goal: Cardiovascular complication will be avoided Outcome: Progressing   Problem: Activity: Goal: Risk for activity intolerance will decrease Outcome: Progressing   Problem: Elimination: Goal: Will not experience complications related to bowel motility Outcome: Progressing Goal: Will not experience complications related to urinary retention Outcome: Progressing   Problem: Coping: Goal: Level of anxiety will decrease Outcome: Progressing   Problem: Skin Integrity: Goal: Risk for impaired skin integrity will decrease Outcome: Progressing   Problem: Safety: Goal: Ability to remain free from injury will improve Outcome: Progressing   Problem: Skin Integrity: Goal: Risk for impaired skin integrity will decrease Outcome: Progressing

## 2020-11-06 NOTE — Progress Notes (Signed)
Referring Physician(s): Dr. Lucia Gaskins  Supervising Physician: Dr. Serafina Royals  Patient Status:  [facility] - In-pt  Chief Complaint: Acute cholecystitis; S/p percutaneous cholecystostomy drain placement 10/31/20 by  Dr. Serafina Royals   Subjective: Patient asleep in bed but easily awakened. She endorses RUQ pain to palpation. Drain in place, dressing clean and dry. Approx. 15 ml of clear bilious fluid in gravity bag   Allergies: Succinylcholine and Milk-related compounds  Medications: Prior to Admission medications   Medication Sig Start Date End Date Taking? Authorizing Provider  acetaminophen (TYLENOL) 500 MG tablet Take 1,000 mg by mouth every 8 (eight) hours as needed for moderate pain or mild pain. 02/28/20  Yes [provider]  calcitonin, salmon, (MIACALCIN/FORTICAL) 200 UNIT/ACT nasal spray Place 1 spray into alternate nostrils daily. 03/30/20  Yes Vann, Jessica U, DO  furosemide (LASIX) 20 MG tablet Take 20 mg by mouth daily. 10/13/20  Yes [provider]  hydrocortisone cream 1 % Apply topically 2 (two) times daily. Patient taking differently: Apply 1 application topically 2 (two) times daily. 10/03/20  Yes Cato Mulligan, MD  ibuprofen (ADVIL) 200 MG tablet Take 200-400 mg by mouth daily as needed for headache (pain).   Yes [provider]  insulin glargine (LANTUS) 100 UNIT/ML Solostar Pen Inject 5 Units into the skin at bedtime. Patient taking differently: Inject 6 Units into the skin at bedtime. 04/12/20  Yes Argentina Donovan, PA-C  Multiple Vitamin (MULTIVITAMIN WITH MINERALS) TABS tablet Take 1 tablet by mouth daily.   Yes [provider]  Nutritional Supplements (,FEEDING SUPPLEMENT, PROSOURCE PLUS) liquid Take 30 mLs by mouth 2 (two) times daily between meals. 10/03/20  Yes Cato Mulligan, MD  oxyCODONE-acetaminophen (PERCOCET) 5-325 MG tablet Take 1-2 tablets by mouth every 8 (eight) hours as needed for severe pain. Patient taking differently: Take 1  tablet by mouth every 8 (eight) hours as needed for severe pain. 03/05/20  Yes Leandrew Koyanagi, MD  Vitamin D, Ergocalciferol, (DRISDOL) 1.25 MG (50000 UNIT) CAPS capsule Take 1 capsule (50,000 Units total) by mouth every 7 (seven) days. Patient taking differently: Take 50,000 Units by mouth every Wednesday. 03/30/20  Yes Vann, Jessica U, DO  escitalopram (LEXAPRO) 10 MG tablet Take 10 mg by mouth daily. 10/20/20   [provider]  famotidine (PEPCID) 20 MG tablet Take 1 tablet (20 mg total) by mouth 2 (two) times daily. Patient not taking: No sig reported 03/30/20   Geradine Girt, DO  Insulin Pen Needle (PEN NEEDLES) 30G X 5 MM MISC Use as instructed. 04/26/20   Argentina Donovan, PA-C  tamsulosin (FLOMAX) 0.4 MG CAPS capsule Take 1 capsule (0.4 mg total) by mouth daily. Patient not taking: No sig reported 09/01/20   Virl Axe, MD     Vital Signs: BP (!) 94/51 (BP Location: Right Arm)   Pulse 91   Temp (!) 97.3 F (36.3 C) (Oral)   Resp 18   Ht 5\' 1"  (1.549 m)   Wt 170 lb (77.1 kg)   SpO2 97%   BMI 32.12 kg/m   Physical Exam Constitutional:      General: She is not in acute distress. Pulmonary:     Effort: Pulmonary effort is normal.  Abdominal:     Palpations: Abdomen is soft.     Tenderness: There is abdominal tenderness.     Comments: RUQ tenderness to palpation. RUQ drain to gravity bag. Dressing is clean and dry. Approximately 15 ml of clear bilious fluid in bag. Drain  is easily flushed with 10 ml of NS.   Skin:    General: Skin is warm and dry.     Imaging: No results found.  Labs:  CBC: Recent Labs    11/03/20 0449 11/04/20 0117 11/05/20 0256 11/06/20 0141  WBC 10.9* 11.4* 8.0 13.3*  HGB 9.9* 10.2* 11.1* 11.4*  HCT 29.0* 31.1* 32.0* 34.2*  PLT 76* 70* PLATELET CLUMPS NOTED ON SMEAR, UNABLE TO ESTIMATE 94*    COAGS: Recent Labs    10/13/2020 1814 11/01/20 0410 11/02/20 0200 11/05/20 0256  INR 1.5* 1.4* 1.9* 1.7*  APTT 32  --   --   --      BMP: Recent Labs    03/19/20 0508 03/23/20 1137 03/26/20 0704 05/27/20 1033 08/17/20 1020 11/03/20 0449 11/04/20 0117 11/05/20 0256 11/06/20 0141  NA  --  143  --  138   < > 144 144 143 144  K  --  4.6  --  4.2   < > 3.2* 3.2* 3.3* 2.7*  CL  --  114*  --  107   < > 111 110 111 113*  CO2  --  23  --  25   < > 22 21* 20* 20*  GLUCOSE  --  139*  --  269*   < > 176* 213* 246* 290*  BUN  --  26*  --  37*   < > 46* 51* 57* 63*  CALCIUM  --  7.3*  --  8.3*   < > 7.8* 7.6* 7.9* 7.8*  CREATININE 1.47* 1.34* 1.28* 1.35*   < > 1.92* 2.04* 2.12* 2.24*  GFRNONAA 33* 37* 39* 36*   < > 26* 24* 23* 21*  GFRAA 38* 43* 45* 42*  --   --   --   --   --    < > = values in this interval not displayed.    LIVER FUNCTION TESTS: Recent Labs    10/29/20 0348 11/02/20 0200 11/05/20 0256 11/06/20 0141  BILITOT 1.8* 1.0 0.8 0.4  AST 22 18 15 15   ALT 12 11 8 7   ALKPHOS 70 56 67 69  PROT 5.3* 4.8* 5.5* 5.0*  ALBUMIN 1.5* 2.1* 1.7* 1.7*    Assessment and Plan:  Acute cholecystitis s/p percutaneous cholecystostomy drain placement 10/31/20: Patient is afebrile, WBC is elevated to 13.3 today. Tbili is 0.4, down from 1.8 on 12/19. Per Epic, drain output is 50 ml with another 15 ml in gravity bag.   Recommend team continue flushing the drain and documenting the output. Keep the dressing clean and dry. Drain will stay in place for at least 8 weeks.   Other plans per primary teams. IR will continue to follow.  Electronically Signed: Soyla Dryer, AGACNP-BC [phone] 11/06/2020, 2:52 PM   I spent a total of 15 Minutes at the the patient's bedside AND on the patient's hospital floor or unit, greater than 50% of which was counseling/coordinating care for percutaneous cholecystostomy drain care.

## 2020-11-07 ENCOUNTER — Inpatient Hospital Stay (HOSPITAL_COMMUNITY): Payer: 59

## 2020-11-07 DIAGNOSIS — I4891 Unspecified atrial fibrillation: Secondary | ICD-10-CM | POA: Diagnosis not present

## 2020-11-07 DIAGNOSIS — K810 Acute cholecystitis: Secondary | ICD-10-CM | POA: Diagnosis not present

## 2020-11-07 DIAGNOSIS — N184 Chronic kidney disease, stage 4 (severe): Secondary | ICD-10-CM | POA: Diagnosis not present

## 2020-11-07 DIAGNOSIS — A414 Sepsis due to anaerobes: Secondary | ICD-10-CM | POA: Diagnosis not present

## 2020-11-07 LAB — URINALYSIS, ROUTINE W REFLEX MICROSCOPIC
Bacteria, UA: NONE SEEN
Bilirubin Urine: NEGATIVE
Glucose, UA: NEGATIVE mg/dL
Ketones, ur: 5 mg/dL — AB
Nitrite: NEGATIVE
Protein, ur: 100 mg/dL — AB
Specific Gravity, Urine: 1.017 (ref 1.005–1.030)
pH: 5 (ref 5.0–8.0)

## 2020-11-07 LAB — CULTURE, BODY FLUID W GRAM STAIN -BOTTLE: Culture: NO GROWTH

## 2020-11-07 LAB — COMPREHENSIVE METABOLIC PANEL
ALT: 10 U/L (ref 0–44)
AST: 17 U/L (ref 15–41)
Albumin: 1.7 g/dL — ABNORMAL LOW (ref 3.5–5.0)
Alkaline Phosphatase: 74 U/L (ref 38–126)
Anion gap: 12 (ref 5–15)
BUN: 67 mg/dL — ABNORMAL HIGH (ref 8–23)
CO2: 20 mmol/L — ABNORMAL LOW (ref 22–32)
Calcium: 7.9 mg/dL — ABNORMAL LOW (ref 8.9–10.3)
Chloride: 110 mmol/L (ref 98–111)
Creatinine, Ser: 2.33 mg/dL — ABNORMAL HIGH (ref 0.44–1.00)
GFR, Estimated: 20 mL/min — ABNORMAL LOW (ref >60)
Glucose, Bld: 232 mg/dL — ABNORMAL HIGH (ref 70–99)
Potassium: 3.2 mmol/L — ABNORMAL LOW (ref 3.5–5.1)
Sodium: 142 mmol/L (ref 135–145)
Total Bilirubin: 0.5 mg/dL (ref 0.3–1.2)
Total Protein: 5.2 g/dL — ABNORMAL LOW (ref 6.5–8.1)

## 2020-11-07 LAB — CBC
HCT: 35 % — ABNORMAL LOW (ref 36.0–46.0)
Hemoglobin: 11.8 g/dL — ABNORMAL LOW (ref 12.0–15.0)
MCH: 29.4 pg (ref 26.0–34.0)
MCHC: 33.7 g/dL (ref 30.0–36.0)
MCV: 87.3 fL (ref 80.0–100.0)
Platelets: 92 10*3/uL — ABNORMAL LOW (ref 150–400)
RBC: 4.01 MIL/uL (ref 3.87–5.11)
RDW: 18.6 % — ABNORMAL HIGH (ref 11.5–15.5)
WBC: 15 10*3/uL — ABNORMAL HIGH (ref 4.0–10.5)
nRBC: 0.2 % (ref 0.0–0.2)

## 2020-11-07 LAB — GLUCOSE, CAPILLARY
Glucose-Capillary: 212 mg/dL — ABNORMAL HIGH (ref 70–99)
Glucose-Capillary: 223 mg/dL — ABNORMAL HIGH (ref 70–99)
Glucose-Capillary: 216 mg/dL — ABNORMAL HIGH (ref 70–99)
Glucose-Capillary: 235 mg/dL — ABNORMAL HIGH (ref 70–99)

## 2020-11-07 LAB — PROCALCITONIN: Procalcitonin: 0.54 ng/mL

## 2020-11-07 LAB — CYTOLOGY - NON PAP

## 2020-11-07 MED ORDER — PROSOURCE PLUS PO LIQD
30.0000 mL | Freq: Three times a day (TID) | ORAL | Status: DC
  Administered 2020-11-07 – 2020-11-12 (×9): 30 mL via ORAL
  Filled 2020-11-07 (×11): qty 30

## 2020-11-07 NOTE — Progress Notes (Signed)
PROGRESS NOTE  Whitney [name] GUY:[id number] DOB: 04-17-1935 DOA: 10/29/2020 PCP: Patient, No Pcp Per  HPI/Recap of past 20 hours: 85 year old female with past medical history of poorly controlled diabetes mellitus, cirrhosis, stage III chronic kidney disease who had a recent admission for emphysematous cystitis/pyelitis due to ESBL Klebsiella plus status post appendectomy for appendicitis last month who presented to the emergency room on 12/17 with complaints of abdominal pain developing suddenly.  Patient's previous hospitalization course was complicated by recurrent abdominal pain and imaging suggested acute cholecystitis although interpretation was challenging in the setting of her anasarca.  At that time, she has not felt to be a candidate for surgery due to frailty or placement of drainage tube as concerns for long-term infectious complications in the setting of her ascites.  In the emergency room, she was found to be in shock with secondary acute kidney injury, lactic acidosis and, shock suspected to be from sepsis from an abdominal source.  She was given fluids, IV antibiotics and started on pressor support.  CT of abdomen and pelvis noted ascites with suspected secondary peritonitis, bowel wall thickening, cholelithiasis and emphysematous pyelonephritis.    Placed in the ICU to the critical care service.  Urology consulted and it was felt that her ascites were not related to her past UTI and only recommendation was antibiotics also cover possible urinary bacteria if present.  Given comorbidities and past history, palliative care consulted, as they have seen patient in the past, however patient's daughter refused this consultation.  General surgery consulted but did not see a significant inflammation of the intestines or areas of abdomen that would point to source requiring surgery.  It was recommended by all for continued antibiotics, follow-up cultures and see how she progresses.  Patient underwent  a HIDA scan on 12/20 which noted cystic duct obstruction and acute cholecystitis and general surgery followed up recommending percutaneous cholecystostomy tube placement.  Interventional radiology followed up and patient underwent therapeutic paracentesis (with 3.5 L of fluid removed) prior to percutaneous cholecystostomy tube placement on 12/21 without incident.  Fluid cultures grew out Klebsiella and antibiotics changed from broad-spectrum meropenem to ceftriaxone and Flagyl.  Infectious disease recommended that drain should remain indefinitely as well as antibiotics should be 14 days of p.o. therapy after clinical improvement on IV antibiotics.  White blood cell count improved and patient started on clear liquids.  With this improvement, patient transferred to stepdown and to the hospitalist service as of 12/25.    Patient about the same, with complaints of occasional abdominal pain.  Her appetite has been poor.  White blood cell count and creatinine both increasing.  Assessment/Plan: Principal Problem: Klebsiella septic shock from acute on chronic cholecystitis, present on admission: Patient met criteria with lactic acidosis, gallbladder source, hypotension requiring pressor support: White blood cell count ever so slightly trending upward although still relatively stable.  She has been able to be weaned off of pressors although still on midodrine.  Continue IV antibiotics and as she becomes more stable, can change over to p.o. x14 days.  Was hoping to change her antibiotics to p.o., but with worsening white blood cell count, need to wait a little longer.  Steroids weaned off as of tomorrow.  Noted increasing white blood cell count.  Procalcitonin mildly elevated.  Will check a lactic acid, chest x-ray, urinalysis.  If these are unrevealing, may need to do diagnostic repeat paracentesis. Active Problems:   Uncontrolled diabetes mellitus ([place]): A1c almost 3 months ago at 7.8.  Will likely repeat.  Have  been elevated on hydrocortisone, should improve as we taper this off.  Acute kidney injury in the setting of CKD (chronic kidney disease), stage IV ([place]): Creatinine at 2 with GFR of 24.  Not much change during this hospitalization.  Initially mention of stage III chronic kidney disease.  Review of previous records actually indicate closer to a stage IV nephropathy with a GFR of under 30.  A little worse today although this is in the context of giving her a dose of Lasix plus albumin  Chronic atrial fibrillation: Stable, rate controlled  Malnutrition: Patient at high risk given multiple admissions and poor p.o. intake.  Now that she is being allowed to eat, diet advanced plus boost breeze 3 times daily plus Prosource twice daily plus multivitamin.  Poor appetite may be due to uremia from worsening renal function.    Pressure injury of skin: Decubitus ulcers, present on admission.  Patient with air mattress.  Wound care    Other ascites: Chronic.  May benefit from additional paracentesis prior to discharge.  We will try a little diuresis with IV albumin.  Will ask IR about repeat paracentesis.    Obesity (BMI 30-39.9): Meets criteria for BMI greater than 30.    Cirrhosis ([place])  Coagulopathy: Secondary to her cirrhosis.  INR at 1.7  Code Status: Had been DNR in the past, changed to full code by daughter and confirmed.  Patient's daughter does not want palliative care involved.  Family Communication: Left message for daughter  Disposition Plan: Unclear.  She has been bedbound for years.  Patient's daughter would like to make sure that she comes home although skilled nursing facility not ruled out completely by daughter.  Active issues: Needs repeat paracentesis. Confirm stability of her renal function and to rule out new infection Advancement and tolerating of her diet   Consultants:  Critical Care  Interventional Radiology  Palliative Care (cancelled)  Urology   General  Surgery  Procedures: Paracentesis 12/18 Arterial line 12/17 >> 12/21 IR PERC cholecystectomy and para 12/21  Repeat para 12/23  Central line placed 12/18-12/24  Antimicrobials:  IV meropenem 12/17-12/23  IV Flagyl 12/23-present  IV Rocephin 12/23-present  DVT prophylaxis: SCDs   Objective: Vitals:   11/07/20 0735 11/07/20 1117  BP: (!) 100/54   Pulse:    Resp:    Temp: 97.7 F (36.5 C) 98 F (36.7 C)  SpO2:      Intake/Output Summary (Last 24 hours) at 11/07/2020 1539 Last data filed at 11/07/2020 0700 Gross per 24 hour  Intake 680 ml  Output 250 ml  Net 430 ml   Filed Weights   11/05/20 0400 11/07/20 0500  Weight: 77.1 kg 76.2 kg   Body mass index is 31.74 kg/m.  Exam:   General: Awake, oriented x2, mild distress secondary to right upper quadrant pain  HEENT: Normocephalic and atraumatic, mucous members are dry, poor dentition  Cardiovascular: Irregular rhythm, rate controlled  Respiratory: Decreased breath sounds throughout secondary to body habitus  Abdomen: Soft, distended, tender in upper abdominal areas hypoactive bowel sounds  Musculoskeletal: No clubbing or cyanosis, 1+ pitting edema  Skin: Decubitus ulcers, present on admission  Psychiatry: Appears appropriate, no evidence of psychoses   Data Reviewed: CBC: Recent Labs  Lab 11/03/20 0449 11/04/20 0117 11/05/20 0256 11/06/20 0141 11/07/20 0404  WBC 10.9* 11.4* 8.0 13.3* 15.0*  HGB 9.9* 10.2* 11.1* 11.4* 11.8*  HCT 29.0* 31.1* 32.0* 34.2* 35.0*  MCV 85.8 86.1 85.1 87.7  87.3  PLT 76* 70* PLATELET CLUMPS NOTED ON SMEAR, UNABLE TO ESTIMATE 94* 92*   Basic Metabolic Panel: Recent Labs  Lab 11/03/20 0449 11/04/20 0117 11/05/20 0256 11/06/20 0141 11/07/20 0404  NA 144 144 143 144 142  K 3.2* 3.2* 3.3* 2.7* 3.2*  CL 111 110 111 113* 110  CO2 22 21* 20* 20* 20*  GLUCOSE 176* 213* 246* 290* 232*  BUN 46* 51* 57* 63* 67*  CREATININE 1.92* 2.04* 2.12* 2.24* 2.33*  CALCIUM  7.8* 7.6* 7.9* 7.8* 7.9*  MG  --  1.8  --   --   --   PHOS  --  3.8  --   --   --    GFR: Estimated Creatinine Clearance: 17.1 mL/min (A) (by C-G formula based on SCr of 2.33 mg/dL (H)). Liver Function Tests: Recent Labs  Lab 11/02/20 0200 11/05/20 0256 11/06/20 0141 11/07/20 0404  AST _0 ALT _1 ALKPHOS 56 67 69 74  BILITOT 1.0 0.8 0.4 0.5  PROT 4.8* 5.5* 5.0* 5.2*  ALBUMIN 2.1* 1.7* 1.7* 1.7*   No results for input(s): LIPASE, AMYLASE in the last 168 hours. Recent Labs  Lab 11/05/20 0258  AMMONIA 39*   Coagulation Profile: Recent Labs  Lab 11/01/20 0410 11/02/20 0200 11/05/20 0256  INR 1.4* 1.9* 1.7*   Cardiac Enzymes: No results for input(s): CKTOTAL, CKMB, CKMBINDEX, TROPONINI in the last 168 hours. BNP (last 3 results) No results for input(s): PROBNP in the last 8760 hours. HbA1C: No results for input(s): HGBA1C in the last 72 hours. CBG: Recent Labs  Lab 11/06/20 1228 11/06/20 1748 11/06/20 2131 11/07/20 0630 11/07/20 1114  GLUCAP 248* 293* 261* 212* 223*   Lipid Profile: No results for input(s): CHOL, HDL, LDLCALC, TRIG, CHOLHDL, LDLDIRECT in the last 72 hours. Thyroid Function Tests: No results for input(s): TSH, T4TOTAL, FREET4, T3FREE, THYROIDAB in the last 72 hours. Anemia Panel: No results for input(s): VITAMINB12, FOLATE, FERRITIN, TIBC, IRON, RETICCTPCT in the last 72 hours. Urine analysis:    Component Value Date/Time   COLORURINE YELLOW 10/28/2020 0222   APPEARANCEUR TURBID (A) 10/28/2020 0222   LABSPEC 1.013 10/28/2020 0222   PHURINE 5.0 10/28/2020 0222   GLUCOSEU NEGATIVE 10/28/2020 0222   HGBUR MODERATE (A) 10/28/2020 0222   BILIRUBINUR NEGATIVE 10/28/2020 0222   KETONESUR NEGATIVE 10/28/2020 0222   PROTEINUR 100 (A) 10/28/2020 0222   NITRITE NEGATIVE 10/28/2020 0222   LEUKOCYTESUR LARGE (A) 10/28/2020 0222   Sepsis Labs: _2 (procalcitonin:4,lacticidven:4)  ) Recent Results (from the past 240  hour(s))  Culture, blood (Routine X 2) w Reflex to ID Panel     Status: None   Collection Time: 10/28/20  9:35 PM   Specimen: BLOOD  Result Value Ref Range Status   Specimen Description BLOOD RIGHT ARM  Final   Special Requests   Final    BOTTLES DRAWN AEROBIC AND ANAEROBIC Blood Culture results may not be optimal due to an inadequate volume of blood received in culture bottles   Culture   Final    NO GROWTH 5 DAYS Performed at [facility], [place] [address], [place], [place] [address]    Report Status 11/02/2020 FINAL  Final  Culture, blood (Routine X 2) w Reflex to ID Panel     Status: None   Collection Time: 10/28/20 10:00 PM   Specimen: BLOOD  Result Value Ref Range Status   Specimen Description BLOOD LEFT HAND  Final   Special  Requests   Final    BOTTLES DRAWN AEROBIC AND ANAEROBIC Blood Culture results may not be optimal due to an excessive volume of blood received in culture bottles   Culture   Final    NO GROWTH 5 DAYS Performed at [facility], [place] [address] [place], [place] [address]    Report Status 11/02/2020 FINAL  Final  Aerobic/Anaerobic Culture (surgical/deep wound)     Status: None   Collection Time: 10/31/20  5:22 PM   Specimen: Abscess  Result Value Ref Range Status   Specimen Description ABSCESS GALL BLADDER  Final   Special Requests NONE  Final   Gram Stain   Final    MODERATE WBC PRESENT,BOTH PMN AND MONONUCLEAR FEW GRAM NEGATIVE RODS FEW GRAM POSITIVE COCCI IN PAIRS IN CHAINS    Culture   Final    ABUNDANT KLEBSIELLA PNEUMONIAE NO ANAEROBES ISOLATED Performed at [facility], [place] [address], [place], [place] [address]    Report Status 11/03/2020 FINAL  Final   Organism ID, Bacteria KLEBSIELLA PNEUMONIAE  Final      Susceptibility   Klebsiella pneumoniae - MIC*    AMPICILLIN >=32 RESISTANT Resistant     CEFAZOLIN <=4 SENSITIVE Sensitive     CEFEPIME <=0.12 SENSITIVE Sensitive     CEFTAZIDIME <=1 SENSITIVE Sensitive      CEFTRIAXONE <=0.25 SENSITIVE Sensitive     CIPROFLOXACIN <=0.25 SENSITIVE Sensitive     GENTAMICIN <=1 SENSITIVE Sensitive     IMIPENEM <=0.25 SENSITIVE Sensitive     TRIMETH/SULFA <=20 SENSITIVE Sensitive     AMPICILLIN/SULBACTAM 4 SENSITIVE Sensitive     PIP/TAZO <=4 SENSITIVE Sensitive     * ABUNDANT KLEBSIELLA PNEUMONIAE  Gram stain     Status: None   Collection Time: 11/02/20  1:04 PM   Specimen: Abdomen; Peritoneal Fluid  Result Value Ref Range Status   Specimen Description FLUID PERITONEAL  Final   Special Requests NONE  Final   Gram Stain   Final    FEW WBC PRESENT, PREDOMINANTLY MONONUCLEAR NO ORGANISMS SEEN Performed at [facility], [address], [place], [place] [address]    Report Status 11/02/2020 FINAL  Final  Culture, body fluid-bottle     Status: None   Collection Time: 11/02/20  1:04 PM   Specimen: BLOOD  Result Value Ref Range Status   Specimen Description BLOOD PERITONEAL  Final   Special Requests BOTTLES DRAWN AEROBIC AND ANAEROBIC 10CC  Final   Culture   Final    NO GROWTH 5 DAYS Performed at [facility], [place] [address], [place], [place] [address]    Report Status 11/07/2020 FINAL  Final      Studies: No results found.  Scheduled Meds: . (feeding supplement) PROSource Plus  30 mL Oral TID BM  . Chlorhexidine Gluconate Cloth  6 each Topical Daily  . insulin aspart  0-9 Units Subcutaneous TID WC & HS  . mouth rinse  15 mL Mouth Rinse BID  . metroNIDAZOLE  500 mg Oral Q8H  . midodrine  10 mg Oral TID WC  . multivitamin with minerals  1 tablet Oral Daily  . sodium chloride flush  10-40 mL Intracatheter Q12H  . sodium chloride flush  5 mL Intracatheter Q8H    Continuous Infusions: . sodium chloride 10 mL/hr at 11/01/20 1836  . cefTRIAXone (ROCEPHIN)  IV 2 g (11/06/20 2144)     LOS: 11 days     Annita Brod, MD  [facility]   11/07/2020, 3:39 PM

## 2020-11-07 NOTE — Progress Notes (Signed)
Referring Physician(s): Dr. Lucia Gaskins  Supervising Physician: Jacqulynn Cadet  Patient Status:  [facility] - In-pt  Chief Complaint: Acute cholecystitis; S/p percutaneous cholecystostomy drain placement 10/31/20 by  Dr. Serafina Royals   Subjective: Patient in bed with blanket pulled over her head. She did not acknowledge my presence but did try to pull her blanket up when I moved it to assess her drain. Drain in place, dressing clean and dry. Approx. 20 ml of clear bilious fluid in gravity bag   Allergies: Succinylcholine and Milk-related compounds  Medications: Prior to Admission medications   Medication Sig Start Date End Date Taking? Authorizing Provider  acetaminophen (TYLENOL) 500 MG tablet Take 1,000 mg by mouth every 8 (eight) hours as needed for moderate pain or mild pain. 02/28/20  Yes [provider]  calcitonin, salmon, (MIACALCIN/FORTICAL) 200 UNIT/ACT nasal spray Place 1 spray into alternate nostrils daily. 03/30/20  Yes Vann, Jessica U, DO  furosemide (LASIX) 20 MG tablet Take 20 mg by mouth daily. 10/13/20  Yes [provider]  hydrocortisone cream 1 % Apply topically 2 (two) times daily. Patient taking differently: Apply 1 application topically 2 (two) times daily. 10/03/20  Yes Cato Mulligan, MD  ibuprofen (ADVIL) 200 MG tablet Take 200-400 mg by mouth daily as needed for headache (pain).   Yes [provider]  insulin glargine (LANTUS) 100 UNIT/ML Solostar Pen Inject 5 Units into the skin at bedtime. Patient taking differently: Inject 6 Units into the skin at bedtime. 04/12/20  Yes Argentina Donovan, PA-C  Multiple Vitamin (MULTIVITAMIN WITH MINERALS) TABS tablet Take 1 tablet by mouth daily.   Yes [provider]  Nutritional Supplements (,FEEDING SUPPLEMENT, PROSOURCE PLUS) liquid Take 30 mLs by mouth 2 (two) times daily between meals. 10/03/20  Yes Cato Mulligan, MD  oxyCODONE-acetaminophen (PERCOCET) 5-325 MG tablet Take 1-2 tablets by mouth  every 8 (eight) hours as needed for severe pain. Patient taking differently: Take 1 tablet by mouth every 8 (eight) hours as needed for severe pain. 03/05/20  Yes Leandrew Koyanagi, MD  Vitamin D, Ergocalciferol, (DRISDOL) 1.25 MG (50000 UNIT) CAPS capsule Take 1 capsule (50,000 Units total) by mouth every 7 (seven) days. Patient taking differently: Take 50,000 Units by mouth every Wednesday. 03/30/20  Yes Vann, Jessica U, DO  escitalopram (LEXAPRO) 10 MG tablet Take 10 mg by mouth daily. 10/20/20   [provider]  famotidine (PEPCID) 20 MG tablet Take 1 tablet (20 mg total) by mouth 2 (two) times daily. Patient not taking: No sig reported 03/30/20   Geradine Girt, DO  Insulin Pen Needle (PEN NEEDLES) 30G X 5 MM MISC Use as instructed. 04/26/20   Argentina Donovan, PA-C  tamsulosin (FLOMAX) 0.4 MG CAPS capsule Take 1 capsule (0.4 mg total) by mouth daily. Patient not taking: No sig reported 09/01/20   Virl Axe, MD     Vital Signs: BP (!) 100/54 (BP Location: Right Arm)   Pulse 89   Temp 98 F (36.7 C) (Oral)   Resp 20   Ht 5\' 1"  (1.549 m)   Wt 168 lb (76.2 kg)   SpO2 95%   BMI 31.74 kg/m   Physical Exam Cardiovascular:     Rate and Rhythm: Normal rate and regular rhythm.     Comments: On the monitor Pulmonary:     Effort: Pulmonary effort is normal.  Abdominal:     Comments: RUQ drain to gravity bag. Dressing is clean and dry. Approximately 20 ml of clear bilious  fluid in bag. Drain is easily flushed with 10 ml of NS.    Skin:    General: Skin is warm and dry.     Imaging: No results found.  Labs:  CBC: Recent Labs    11/04/20 0117 11/05/20 0256 11/06/20 0141 11/07/20 0404  WBC 11.4* 8.0 13.3* 15.0*  HGB 10.2* 11.1* 11.4* 11.8*  HCT 31.1* 32.0* 34.2* 35.0*  PLT 70* PLATELET CLUMPS NOTED ON SMEAR, UNABLE TO ESTIMATE 94* 92*    COAGS: Recent Labs    10/30/2020 1814 11/01/20 0410 11/02/20 0200 11/05/20 0256  INR 1.5* 1.4* 1.9* 1.7*  APTT 32  --    --   --     BMP: Recent Labs    03/19/20 0508 03/23/20 1137 03/26/20 0704 05/27/20 1033 08/17/20 1020 11/04/20 0117 11/05/20 0256 11/06/20 0141 11/07/20 0404  NA  --  143  --  138   < > 144 143 144 142  K  --  4.6  --  4.2   < > 3.2* 3.3* 2.7* 3.2*  CL  --  114*  --  107   < > 110 111 113* 110  CO2  --  23  --  25   < > 21* 20* 20* 20*  GLUCOSE  --  139*  --  269*   < > 213* 246* 290* 232*  BUN  --  26*  --  37*   < > 51* 57* 63* 67*  CALCIUM  --  7.3*  --  8.3*   < > 7.6* 7.9* 7.8* 7.9*  CREATININE 1.47* 1.34* 1.28* 1.35*   < > 2.04* 2.12* 2.24* 2.33*  GFRNONAA 33* 37* 39* 36*   < > 24* 23* 21* 20*  GFRAA 38* 43* 45* 42*  --   --   --   --   --    < > = values in this interval not displayed.    LIVER FUNCTION TESTS: Recent Labs    11/02/20 0200 11/05/20 0256 11/06/20 0141 11/07/20 0404  BILITOT 1.0 0.8 0.4 0.5  AST 18 15 15 17   ALT 11 8 7 10   ALKPHOS 56 67 69 74  PROT 4.8* 5.5* 5.0* 5.2*  ALBUMIN 2.1* 1.7* 1.7* 1.7*    Assessment and Plan:   Acute cholecystitis s/p percutaneous cholecystostomy drain placement 10/31/20: Patient is afebrile, WBC is elevated to 15 today. Tbili is 0.5, down from 1.8 on 12/19. Per Epic, drain output is 50 ml with another 20 ml in gravity bag.   Recommend team continue flushing the drain and documenting the output. Keep the dressing clean and dry. Drain will stay in place for at least 8 weeks.   Other plans per primary teams. IR will continue to follow.  Electronically Signed: Soyla Dryer, AGACNP-BC [phone] 11/07/2020, 3:36 PM   I spent a total of 15 Minutes at the the patient's bedside AND on the patient's hospital floor or unit, greater than 50% of which was counseling/coordinating care for percutaneous cholecystostomy drain care.

## 2020-11-07 NOTE — Progress Notes (Signed)
Nutrition Follow Up  DOCUMENTATION CODES:   Not applicable  INTERVENTION:    Snacks BID  Increase ProSource Plus TID, each supplement provides 100 kcals and 15 grams protein.   MVI with minerals daily  NUTRITION DIAGNOSIS:   Inadequate oral intake related to poor appetite as evidenced by meal completion < 50%.  Ongoing  GOAL:   Patient will meet greater than or equal to 90% of their needs   Progressing   MONITOR:   Diet advancement,PO intake,Skin  REASON FOR ASSESSMENT:   NPO/Clear Liquid Diet    ASSESSMENT:   Pt with PMH of DM, CKD 3, cirrhosis, and recent admissions - 08/18/20 for pyelonephritis; 09/18/20 acute appendicitis s/p appendectomy now readmitted 12/17 with hypovolemic shock and cholecystitis.   12/17-12/22 NPO 12/18 s/p paracentesis with 1400 ml removed 12/21 s/p perc chole drain and paracentesis with 3.5 L removed  12/22 s/p paracentesis; diet adv to CLD; meal completion 75% 12/23 s/p paracentesis with 1.85 L removed   Appetite fluctuates. Initially refused all medicaions, meals, and supplements this am but did better once daughter arrived. Seems intake improves when daughter is around. Last eight meal completions charted as 10-100%. Taking ProSource but does not like Boost. Does not like milky supplements. Try snacks.   Admission weight: 73 kg  Current weight: 76.2 kg   UOP: 200 ml x 24 hrs RUQ drain: 50 ml x 24 hrs   Medications: SS novolog Labs: K 3.2 (L) Cr 2.33- up from yesterday CBG [id number]  Diet Order:   Diet Order            Diet regular Room service appropriate? Yes with Assist; Fluid consistency: Thin  Diet effective now                 EDUCATION NEEDS:   Not appropriate for education at this time  Skin:  Skin Assessment: Skin Integrity Issues: Skin Integrity Issues:: Stage II Stage II: R buttocks Other: skin tear labia  Last BM:  12/28  Height:   Ht Readings from Last 1 Encounters:  11/03/20 5\' 1"  (1.549 m)     Weight:   Wt Readings from Last 1 Encounters:  11/07/20 76.2 kg    Ideal Body Weight:  52.2 kg  BMI:  Body mass index is 31.74 kg/m.  Estimated Nutritional Needs:   Kcal:  1750-2000  Protein:  95-110 grams  Fluid:  > 1.7 L/day  Mariana Single RD, LDN Clinical Nutrition Pager listed in [place]

## 2020-11-08 ENCOUNTER — Inpatient Hospital Stay (HOSPITAL_COMMUNITY): Payer: 59

## 2020-11-08 ENCOUNTER — Inpatient Hospital Stay: Payer: Self-pay

## 2020-11-08 DIAGNOSIS — K810 Acute cholecystitis: Secondary | ICD-10-CM | POA: Diagnosis not present

## 2020-11-08 DIAGNOSIS — N184 Chronic kidney disease, stage 4 (severe): Secondary | ICD-10-CM | POA: Diagnosis not present

## 2020-11-08 DIAGNOSIS — I4891 Unspecified atrial fibrillation: Secondary | ICD-10-CM | POA: Diagnosis not present

## 2020-11-08 DIAGNOSIS — A414 Sepsis due to anaerobes: Secondary | ICD-10-CM | POA: Diagnosis not present

## 2020-11-08 LAB — COMPREHENSIVE METABOLIC PANEL
ALT: 8 U/L (ref 0–44)
AST: 19 U/L (ref 15–41)
Albumin: 1.6 g/dL — ABNORMAL LOW (ref 3.5–5.0)
Alkaline Phosphatase: 89 U/L (ref 38–126)
Anion gap: 9 (ref 5–15)
BUN: 70 mg/dL — ABNORMAL HIGH (ref 8–23)
CO2: 22 mmol/L (ref 22–32)
Calcium: 7.9 mg/dL — ABNORMAL LOW (ref 8.9–10.3)
Chloride: 111 mmol/L (ref 98–111)
Creatinine, Ser: 2.36 mg/dL — ABNORMAL HIGH (ref 0.44–1.00)
GFR, Estimated: 20 mL/min — ABNORMAL LOW (ref >60)
Glucose, Bld: 234 mg/dL — ABNORMAL HIGH (ref 70–99)
Potassium: 3 mmol/L — ABNORMAL LOW (ref 3.5–5.1)
Sodium: 142 mmol/L (ref 135–145)
Total Bilirubin: 0.5 mg/dL (ref 0.3–1.2)
Total Protein: 5 g/dL — ABNORMAL LOW (ref 6.5–8.1)

## 2020-11-08 LAB — LACTIC ACID, PLASMA
Lactic Acid, Venous: 2.1 mmol/L (ref 0.5–1.9)
Lactic Acid, Venous: 1.9 mmol/L (ref 0.5–1.9)

## 2020-11-08 LAB — CBC
HCT: 33.2 % — ABNORMAL LOW (ref 36.0–46.0)
Hemoglobin: 11.3 g/dL — ABNORMAL LOW (ref 12.0–15.0)
MCH: 29.5 pg (ref 26.0–34.0)
MCHC: 34 g/dL (ref 30.0–36.0)
MCV: 86.7 fL (ref 80.0–100.0)
Platelets: 96 10*3/uL — ABNORMAL LOW (ref 150–400)
RBC: 3.83 MIL/uL — ABNORMAL LOW (ref 3.87–5.11)
RDW: 18.6 % — ABNORMAL HIGH (ref 11.5–15.5)
WBC: 13.9 10*3/uL — ABNORMAL HIGH (ref 4.0–10.5)
nRBC: 0.1 % (ref 0.0–0.2)

## 2020-11-08 LAB — GLUCOSE, CAPILLARY
Glucose-Capillary: 205 mg/dL — ABNORMAL HIGH (ref 70–99)
Glucose-Capillary: 189 mg/dL — ABNORMAL HIGH (ref 70–99)
Glucose-Capillary: 151 mg/dL — ABNORMAL HIGH (ref 70–99)
Glucose-Capillary: 160 mg/dL — ABNORMAL HIGH (ref 70–99)

## 2020-11-08 MED ORDER — POTASSIUM CHLORIDE 2 MEQ/ML IV SOLN
INTRAVENOUS | Status: AC
  Filled 2020-11-08 (×4): qty 1000

## 2020-11-08 MED ORDER — KCL-LACTATED RINGERS 20 MEQ/L IV SOLN
INTRAVENOUS | Status: DC
  Filled 2020-11-08: qty 1000

## 2020-11-08 MED ORDER — SODIUM CHLORIDE 0.9 % IV SOLN
2.0000 g | INTRAVENOUS | Status: DC
  Filled 2020-11-08 (×2): qty 20

## 2020-11-08 MED ORDER — METRONIDAZOLE 500 MG PO TABS
500.0000 mg | ORAL_TABLET | Freq: Three times a day (TID) | ORAL | Status: DC
  Filled 2020-11-08 (×2): qty 1

## 2020-11-08 MED ORDER — ESCITALOPRAM OXALATE 10 MG PO TABS
10.0000 mg | ORAL_TABLET | Freq: Every day | ORAL | Status: DC
  Administered 2020-11-08 – 2020-11-15 (×8): 10 mg via ORAL
  Filled 2020-11-08 (×9): qty 1

## 2020-11-08 MED ORDER — ALBUMIN HUMAN 25 % IV SOLN
12.5000 g | Freq: Once | INTRAVENOUS | Status: AC
  Administered 2020-11-08: 11:00:00 12.5 g via INTRAVENOUS
  Filled 2020-11-08: qty 50

## 2020-11-08 MED ORDER — POTASSIUM CHLORIDE 2 MEQ/ML IV SOLN
INTRAVENOUS | Status: DC
  Filled 2020-11-08 (×3): qty 1000

## 2020-11-08 MED ORDER — DILTIAZEM HCL ER 60 MG PO CP12
60.0000 mg | ORAL_CAPSULE | Freq: Two times a day (BID) | ORAL | Status: DC
  Administered 2020-11-08 – 2020-11-10 (×5): 60 mg via ORAL
  Filled 2020-11-08 (×6): qty 1

## 2020-11-08 NOTE — Progress Notes (Signed)
Pt lost IV access. IV removed due to pain and drainage at insertion site. IV team consulted and at bedside with RN. Unable to obtain IV access due to swelling and bruising in upper extremities. MD notified. Will continue to monitor.  Arletta Bale, RN

## 2020-11-08 NOTE — Progress Notes (Signed)
CRITICAL VALUE ALERT  Critical Value:  Lactic acid  2.1  Date & Time Notied:  11/08/2020 @0230   Provider Notified: Darrell Jewel MD  Orders Received/Actions taken:   MD also notified of potassium level of 3.0.

## 2020-11-08 NOTE — Progress Notes (Signed)
Referring Physician(s): Dr. Lucia Gaskins  Supervising Physician: Aletta Edouard  Patient Status:  [facility] - In-pt  Chief Complaint:  Acute cholecystitis; S/p percutaneous cholecystostomy drain placement 10/31/20 by  Dr. Serafina Royals   Subjective:  Patient with covers pulled up over head, however awakens and engages in visit with assistance from interpreter.  Answers only a few of my questions, but repeatedly states "my heart is beating twice."   Allergies: Succinylcholine and Milk-related compounds  Medications: Prior to Admission medications   Medication Sig Start Date End Date Taking? Authorizing Provider  acetaminophen (TYLENOL) 500 MG tablet Take 1,000 mg by mouth every 8 (eight) hours as needed for moderate pain or mild pain. 02/28/20  Yes [provider]  calcitonin, salmon, (MIACALCIN/FORTICAL) 200 UNIT/ACT nasal spray Place 1 spray into alternate nostrils daily. 03/30/20  Yes Vann, Jessica U, DO  furosemide (LASIX) 20 MG tablet Take 20 mg by mouth daily. 10/13/20  Yes [provider]  hydrocortisone cream 1 % Apply topically 2 (two) times daily. Patient taking differently: Apply 1 application topically 2 (two) times daily. 10/03/20  Yes Cato Mulligan, MD  ibuprofen (ADVIL) 200 MG tablet Take 200-400 mg by mouth daily as needed for headache (pain).   Yes [provider]  insulin glargine (LANTUS) 100 UNIT/ML Solostar Pen Inject 5 Units into the skin at bedtime. Patient taking differently: Inject 6 Units into the skin at bedtime. 04/12/20  Yes Argentina Donovan, PA-C  Multiple Vitamin (MULTIVITAMIN WITH MINERALS) TABS tablet Take 1 tablet by mouth daily.   Yes [provider]  Nutritional Supplements (,FEEDING SUPPLEMENT, PROSOURCE PLUS) liquid Take 30 mLs by mouth 2 (two) times daily between meals. 10/03/20  Yes Cato Mulligan, MD  oxyCODONE-acetaminophen (PERCOCET) 5-325 MG tablet Take 1-2 tablets by mouth every 8 (eight) hours as needed for severe  pain. Patient taking differently: Take 1 tablet by mouth every 8 (eight) hours as needed for severe pain. 03/05/20  Yes Leandrew Koyanagi, MD  Vitamin D, Ergocalciferol, (DRISDOL) 1.25 MG (50000 UNIT) CAPS capsule Take 1 capsule (50,000 Units total) by mouth every 7 (seven) days. Patient taking differently: Take 50,000 Units by mouth every Wednesday. 03/30/20  Yes Vann, Jessica U, DO  escitalopram (LEXAPRO) 10 MG tablet Take 10 mg by mouth daily. 10/20/20   [provider]  famotidine (PEPCID) 20 MG tablet Take 1 tablet (20 mg total) by mouth 2 (two) times daily. Patient not taking: No sig reported 03/30/20   Geradine Girt, DO  Insulin Pen Needle (PEN NEEDLES) 30G X 5 MM MISC Use as instructed. 04/26/20   Argentina Donovan, PA-C  tamsulosin (FLOMAX) 0.4 MG CAPS capsule Take 1 capsule (0.4 mg total) by mouth daily. Patient not taking: No sig reported 09/01/20   Virl Axe, MD     Vital Signs: BP (!) 100/54 (BP Location: Right Arm)   Pulse 89   Temp 98 F (36.7 C) (Oral)   Resp 20   Ht 5\' 1"  (1.549 m)   Wt 168 lb (76.2 kg)   SpO2 95%   BMI 31.74 kg/m   Physical Exam Cardiovascular:     Rate and Rhythm: Normal rate and regular rhythm.     Comments: On the monitor Pulmonary:     Effort: Pulmonary effort is normal.  Abdominal:     Comments: RUQ drain to gravity bag. Dressing with small amount of output, but largely dry. Approximately 20 ml of clear bilious fluid in bag.  Skin:    General:  Skin is warm and dry.     Imaging: No results found.  Labs:  CBC: Recent Labs    11/04/20 0117 11/05/20 0256 11/06/20 0141 11/07/20 0404  WBC 11.4* 8.0 13.3* 15.0*  HGB 10.2* 11.1* 11.4* 11.8*  HCT 31.1* 32.0* 34.2* 35.0*  PLT 70* PLATELET CLUMPS NOTED ON SMEAR, UNABLE TO ESTIMATE 94* 92*    COAGS: Recent Labs    11/01/2020 1814 11/01/20 0410 11/02/20 0200 11/05/20 0256  INR 1.5* 1.4* 1.9* 1.7*  APTT 32  --   --   --     BMP: Recent Labs    03/19/20 0508  03/23/20 1137 03/26/20 0704 05/27/20 1033 08/17/20 1020 11/04/20 0117 11/05/20 0256 11/06/20 0141 11/07/20 0404  NA  --  143  --  138   < > 144 143 144 142  K  --  4.6  --  4.2   < > 3.2* 3.3* 2.7* 3.2*  CL  --  114*  --  107   < > 110 111 113* 110  CO2  --  23  --  25   < > 21* 20* 20* 20*  GLUCOSE  --  139*  --  269*   < > 213* 246* 290* 232*  BUN  --  26*  --  37*   < > 51* 57* 63* 67*  CALCIUM  --  7.3*  --  8.3*   < > 7.6* 7.9* 7.8* 7.9*  CREATININE 1.47* 1.34* 1.28* 1.35*   < > 2.04* 2.12* 2.24* 2.33*  GFRNONAA 33* 37* 39* 36*   < > 24* 23* 21* 20*  GFRAA 38* 43* 45* 42*  --   --   --   --   --    < > = values in this interval not displayed.    LIVER FUNCTION TESTS: Recent Labs    11/02/20 0200 11/05/20 0256 11/06/20 0141 11/07/20 0404  BILITOT 1.0 0.8 0.4 0.5  AST 18 15 15 17   ALT 11 8 7 10   ALKPHOS 56 67 69 74  PROT 4.8* 5.5* 5.0* 5.2*  ALBUMIN 2.1* 1.7* 1.7* 1.7*    Assessment and Plan: Acute cholecystitis s/p percutaneous cholecystostomy drain placement 10/31/20:  Febrile WBC 13.9, lactic acid 1.9 Culture with Klebsiella Continues IV abx. Drain in place with ongoing output.  Primary team notified of patient complaints re: heart rate.  Continue current drain care for now.   Other plans per primary teams. IR will continue to follow.  Electronically Signed: Brynda Greathouse, MS RD PA-C 11/08/2020, 2:12 PM   I spent a total of 15 Minutes at the the patient's bedside AND on the patient's hospital floor or unit, greater than 50% of which was counseling/coordinating care for percutaneous cholecystostomy drain care.

## 2020-11-08 NOTE — Progress Notes (Signed)
Assessed for USG PIV bilateral forearms bruised, veins small and not suitable for PIV. Recommend PICC due to multiple PIV infiltrations/occulisons and multiple sites needed.

## 2020-11-08 NOTE — Progress Notes (Signed)
PROGRESS NOTE  Canary Fister IDP:[id number] DOB: 20-Mar-1935 DOA: 11/08/2020 PCP: Patient, No Pcp Per  HPI/Recap of past 24 hours: 85 year old female with past medical history of poorly controlled diabetes mellitus, cirrhosis, stage III chronic kidney disease who had a recent admission for emphysematous cystitis/pyelitis due to ESBL Klebsiella plus status post appendectomy for appendicitis last month who presented to the emergency room on 12/17 with complaints of abdominal pain developing suddenly.  Patient's previous hospitalization course was complicated by recurrent abdominal pain and imaging suggested acute cholecystitis although interpretation was challenging in the setting of her anasarca.  At that time, she has not felt to be a candidate for surgery due to frailty or placement of drainage tube as concerns for long-term infectious complications in the setting of her ascites.  In the emergency room, she was found to be in shock with secondary acute kidney injury, lactic acidosis and, shock suspected to be from sepsis from an abdominal source.  She was given fluids, IV antibiotics and started on pressor support.  CT of abdomen and pelvis noted ascites with suspected secondary peritonitis, bowel wall thickening, cholelithiasis and emphysematous pyelonephritis.    Placed in the ICU to the critical care service.  Urology consulted and it was felt that her ascites were not related to her past UTI and only recommendation was antibiotics also cover possible urinary bacteria if present.  Given comorbidities and past history, palliative care consulted, as they have seen patient in the past, however patient's daughter refused this consultation.  General surgery consulted but did not see a significant inflammation of the intestines or areas of abdomen that would point to source requiring surgery.  It was recommended by all for continued antibiotics, follow-up cultures and see how she progresses.  Patient underwent  a HIDA scan on 12/20 which noted cystic duct obstruction and acute cholecystitis and general surgery followed up recommending percutaneous cholecystostomy tube placement.  Interventional radiology followed up and patient underwent therapeutic paracentesis (with 3.5 L of fluid removed) prior to percutaneous cholecystostomy tube placement on 12/21 without incident.  Fluid cultures grew out Klebsiella and antibiotics changed from broad-spectrum meropenem to ceftriaxone and Flagyl.  Infectious disease recommended that drain should remain indefinitely as well as antibiotics should be 14 days of p.o. therapy after clinical improvement on IV antibiotics.  White blood cell count improved and patient started on clear liquids.  With this improvement, patient transferred to stepdown and to the hospitalist service as of 12/25.    Patient about the same, with complaints of occasional abdominal pain.  Creatinine was little change but white blood cell count improving.  Lactic acid level was elevated on 12/28, likely due to intravascular volume depletion so she was started on fluids.  Chest x-ray noted possible loculated pleural effusion.  Patient today complains of palpitations.  Assessment/Plan: Principal Problem: Klebsiella septic shock from acute on chronic cholecystitis, present on admission: Patient met criteria with lactic acidosis, gallbladder source, hypotension requiring pressor support: White blood cell count ever so slightly trending upward although still relatively stable.  She has been able to be weaned off of pressors although still on midodrine.  Continue IV antibiotics and as she becomes more stable, can change over to p.o. x14 days.  Was hoping to change her antibiotics to p.o., but with worsening white blood cell count, need to wait a little longer.  Steroids weaned off as of tomorrow.  Noted increasing white blood cell count.  Procalcitonin mildly elevated.  Checking CT to rule out empyema.  Appreciate  interventional radiology follow-up. Active Problems:   Uncontrolled diabetes mellitus ([place]): A1c almost 3 months ago at 7.8.  Steroids tapered off  Acute kidney injury in the setting of CKD (chronic kidney disease), stage IV ([place]): Creatinine at 2 with GFR of 24.  Not much change during this hospitalization.  Initially mention of stage III chronic kidney disease.  Review of previous records actually indicate closer to a stage IV nephropathy with a GFR of under 30.  About the same from previous day  Chronic atrial fibrillation: Stable, rate controlled  Malnutrition: Patient at high risk given multiple admissions and poor p.o. intake.  Now that she is being allowed to eat, diet advanced plus boost breeze 3 times daily plus Prosource twice daily plus multivitamin.  Poor appetite may be due to uremia from worsening renal function.    Pressure injury of skin: Decubitus ulcers, present on admission.  Patient with air mattress.  Wound care    Other ascites: Chronic.  May benefit from additional paracentesis prior to discharge.  Tried trial of diuresis with IV albumin.  For now getting fluids due to lactic acidosis    Obesity (BMI 30-39.9): Meets criteria for BMI greater than 30.    Cirrhosis ([place])  Coagulopathy: Secondary to her cirrhosis.  INR at 1.7  Code Status: Had been DNR in the past, changed to full code by daughter and confirmed.  Patient's daughter does not want palliative care involved.  Family Communication: Left message for daughter  Disposition Plan: Unclear.  She has been bedbound for years.  Patient's daughter would like to make sure that she comes home although skilled nursing facility not ruled out completely by daughter.  Active issues: Needs repeat paracentesis. Confirm stability of her renal function and to rule out empyema Advancement and tolerating of her diet   Consultants:  Critical Care  Interventional Radiology  Palliative Care (cancelled)  Urology    General Surgery  Procedures: Paracentesis 12/18 Arterial line 12/17 >> 12/21 IR PERC cholecystectomy and para 12/21  Repeat para 12/23  Central line placed 12/18-12/24  Antimicrobials:  IV meropenem 12/17-12/23  IV Flagyl 12/23-present  IV Rocephin 12/23-present  DVT prophylaxis: SCDs   Objective: Vitals:   11/08/20 0815 11/08/20 1129  BP: 101/83 (!) 100/58  Pulse: 99 90  Resp: 16 20  Temp: (!) 97.4 F (36.3 C) (!) 97.4 F (36.3 C)  SpO2: 97% 94%    Intake/Output Summary (Last 24 hours) at 11/08/2020 1549 Last data filed at 11/08/2020 0655 Gross per 24 hour  Intake 293.33 ml  Output 190 ml  Net 103.33 ml   Filed Weights   11/07/20 0500 11/08/20 0428  Weight: 76.2 kg 78.9 kg   Body mass index is 32.88 kg/m.  Exam:   General: Awake, oriented x2, moderate distress secondary to abdominal pain  HEENT: Normocephalic and atraumatic, mucous members are dry, poor dentition  Cardiovascular: Irregular rhythm, borderline tachycardia  Respiratory: Decreased breath sounds throughout secondary to body habitus  Abdomen: Soft, distended, tender in upper abdominal areas hypoactive bowel sounds, drain in place  Musculoskeletal: No clubbing or cyanosis, 1+ pitting edema  Skin: Decubitus ulcers, present on admission  Psychiatry: Appears appropriate, no evidence of psychoses   Data Reviewed: CBC: Recent Labs  Lab 11/04/20 0117 11/05/20 0256 11/06/20 0141 11/07/20 0404 11/08/20 0127  WBC 11.4* 8.0 13.3* 15.0* 13.9*  HGB 10.2* 11.1* 11.4* 11.8* 11.3*  HCT 31.1* 32.0* 34.2* 35.0* 33.2*  MCV 86.1 85.1 87.7 87.3 86.7  PLT 70*  PLATELET CLUMPS NOTED ON SMEAR, UNABLE TO ESTIMATE 94* 92* 96*   Basic Metabolic Panel: Recent Labs  Lab 11/04/20 0117 11/05/20 0256 11/06/20 0141 11/07/20 0404 11/08/20 0127  NA 144 143 144 142 142  K 3.2* 3.3* 2.7* 3.2* 3.0*  CL 110 111 113* 110 111  CO2 21* 20* 20* 20* 22  GLUCOSE 213* 246* 290* 232* 234*  BUN 51* 57*  63* 67* 70*  CREATININE 2.04* 2.12* 2.24* 2.33* 2.36*  CALCIUM 7.6* 7.9* 7.8* 7.9* 7.9*  MG 1.8  --   --   --   --   PHOS 3.8  --   --   --   --    GFR: Estimated Creatinine Clearance: 17.2 mL/min (A) (by C-G formula based on SCr of 2.36 mg/dL (H)). Liver Function Tests: Recent Labs  Lab 11/02/20 0200 11/05/20 0256 11/06/20 0141 11/07/20 0404 11/08/20 0127  AST _0 ALT _1 ALKPHOS 56 67 69 74 89  BILITOT 1.0 0.8 0.4 0.5 0.5  PROT 4.8* 5.5* 5.0* 5.2* 5.0*  ALBUMIN 2.1* 1.7* 1.7* 1.7* 1.6*   No results for input(s): LIPASE, AMYLASE in the last 168 hours. Recent Labs  Lab 11/05/20 0258  AMMONIA 39*   Coagulation Profile: Recent Labs  Lab 11/02/20 0200 11/05/20 0256  INR 1.9* 1.7*   Cardiac Enzymes: No results for input(s): CKTOTAL, CKMB, CKMBINDEX, TROPONINI in the last 168 hours. BNP (last 3 results) No results for input(s): PROBNP in the last 8760 hours. HbA1C: No results for input(s): HGBA1C in the last 72 hours. CBG: Recent Labs  Lab 11/07/20 1114 11/07/20 1627 11/07/20 2101 11/08/20 0605 11/08/20 1147  GLUCAP 223* 216* 235* 205* 189*   Lipid Profile: No results for input(s): CHOL, HDL, LDLCALC, TRIG, CHOLHDL, LDLDIRECT in the last 72 hours. Thyroid Function Tests: No results for input(s): TSH, T4TOTAL, FREET4, T3FREE, THYROIDAB in the last 72 hours. Anemia Panel: No results for input(s): VITAMINB12, FOLATE, FERRITIN, TIBC, IRON, RETICCTPCT in the last 72 hours. Urine analysis:    Component Value Date/Time   COLORURINE AMBER (A) 11/07/2020 2156   APPEARANCEUR TURBID (A) 11/07/2020 2156   LABSPEC 1.017 11/07/2020 2156   PHURINE 5.0 11/07/2020 2156   GLUCOSEU NEGATIVE 11/07/2020 2156   HGBUR MODERATE (A) 11/07/2020 2156   [place] NEGATIVE 11/07/2020 2156   KETONESUR 5 (A) 11/07/2020 2156   PROTEINUR 100 (A) 11/07/2020 2156   NITRITE NEGATIVE 11/07/2020 2156   LEUKOCYTESUR LARGE (A) 11/07/2020 2156   Sepsis  Labs: _2 (procalcitonin:4,lacticidven:4)  ) Recent Results (from the past 240 hour(s))  Aerobic/Anaerobic Culture (surgical/deep wound)     Status: None   Collection Time: 10/31/20  5:22 PM   Specimen: Abscess  Result Value Ref Range Status   Specimen Description ABSCESS GALL BLADDER  Final   Special Requests NONE  Final   Gram Stain   Final    MODERATE WBC PRESENT,BOTH PMN AND MONONUCLEAR FEW GRAM NEGATIVE RODS FEW GRAM POSITIVE COCCI IN PAIRS IN CHAINS    Culture   Final    ABUNDANT KLEBSIELLA PNEUMONIAE NO ANAEROBES ISOLATED Performed at [facility], [place] [address], [place], [place] [address]    Report Status 11/03/2020 FINAL  Final   Organism ID, Bacteria KLEBSIELLA PNEUMONIAE  Final      Susceptibility   Klebsiella pneumoniae - MIC*    AMPICILLIN >=32 RESISTANT Resistant     CEFAZOLIN <=4 SENSITIVE Sensitive     CEFEPIME <=0.12 SENSITIVE Sensitive  CEFTAZIDIME <=1 SENSITIVE Sensitive     CEFTRIAXONE <=0.25 SENSITIVE Sensitive     CIPROFLOXACIN <=0.25 SENSITIVE Sensitive     GENTAMICIN <=1 SENSITIVE Sensitive     IMIPENEM <=0.25 SENSITIVE Sensitive     TRIMETH/SULFA <=20 SENSITIVE Sensitive     AMPICILLIN/SULBACTAM 4 SENSITIVE Sensitive     PIP/TAZO <=4 SENSITIVE Sensitive     * ABUNDANT KLEBSIELLA PNEUMONIAE  Gram stain     Status: None   Collection Time: 11/02/20  1:04 PM   Specimen: Abdomen; Peritoneal Fluid  Result Value Ref Range Status   Specimen Description FLUID PERITONEAL  Final   Special Requests NONE  Final   Gram Stain   Final    FEW WBC PRESENT, PREDOMINANTLY MONONUCLEAR NO ORGANISMS SEEN Performed at [facility], [place] [address] [place], [place] [address]    Report Status 11/02/2020 FINAL  Final  Culture, body fluid-bottle     Status: None   Collection Time: 11/02/20  1:04 PM   Specimen: BLOOD  Result Value Ref Range Status   Specimen Description BLOOD PERITONEAL  Final   Special Requests BOTTLES DRAWN AEROBIC AND  ANAEROBIC 10CC  Final   Culture   Final    NO GROWTH 5 DAYS Performed at [facility], [place] [address], [place], [place] [address]    Report Status 11/07/2020 FINAL  Final      Studies: DG CHEST PORT 1 VIEW  Result Date: 11/07/2020 CLINICAL DATA:  Follow-up pneumonia septic shock EXAM: PORTABLE CHEST 1 VIEW COMPARISON:  10/16/2020, 08/17/2020 FINDINGS: Low lung volumes. Increased left pleural effusion with possible loculation laterally. Similar right basilar but slight increased left basilar airspace disease. Possible small right effusion. Diffuse coarse interstitial opacity likely due to underlying chronic interstitial disease. Stable enlarged cardiomediastinal silhouette. No pneumothorax. Chronic deformity at the left shoulder IMPRESSION: Low lung volumes with continued bibasilar airspace disease, left greater than right, slightly increased on the left compared to the prior study. Increased left pleural effusion with possible loculation laterally. Electronically Signed   By: Donavan Foil M.D.   On: 11/07/2020 16:31   DG Abd Portable 1V  Result Date: 11/07/2020 CLINICAL DATA:  Distension with pain EXAM: PORTABLE ABDOMEN - 1 VIEW COMPARISON:  09/25/2020 FINDINGS: IVC filter overlies the right aspect of the lumbar spine. Left-sided ureteral stent remains in place. Mild diffuse air-filled small and large bowel. Slight centralized appearance of bowel with bulging appearance of flanks could be secondary to ascites. Left femoral hardware with adjacent high density material as before. Chronic right hip deformity. IMPRESSION: 1. Mild diffuse air-filled small and large bowel, possible ileus. 2. Centralized appearance of the bowel with prominent flank soft tissues, could be due to ascites. Electronically Signed   By: Donavan Foil M.D.   On: 11/07/2020 16:28    Scheduled Meds: . (feeding supplement) PROSource Plus  30 mL Oral TID BM  . Chlorhexidine Gluconate Cloth  6 each Topical Daily  .  diltiazem  60 mg Oral Q12H  . escitalopram  10 mg Oral Daily  . insulin aspart  0-9 Units Subcutaneous TID WC & HS  . mouth rinse  15 mL Mouth Rinse BID  . metroNIDAZOLE  500 mg Oral Q8H  . midodrine  10 mg Oral TID WC  . multivitamin with minerals  1 tablet Oral Daily  . sodium chloride flush  10-40 mL Intracatheter Q12H  . sodium chloride flush  5 mL Intracatheter Q8H    Continuous Infusions: . sodium chloride  10 mL/hr at 11/01/20 1836  . cefTRIAXone (ROCEPHIN)  IV 2 g (11/07/20 2051)  . lactated ringers 1,000 mL with potassium chloride 40 mEq infusion       LOS: 12 days     Annita Brod, MD [facility]   11/08/2020, 3:49 PM

## 2020-11-08 NOTE — Progress Notes (Addendum)
Biliary drain noted to be leaking around its insertion site. Dressing with moderate bile drainage changed twice during this shift, will report to oncoming RN.

## 2020-11-09 ENCOUNTER — Inpatient Hospital Stay (HOSPITAL_COMMUNITY): Payer: 59

## 2020-11-09 DIAGNOSIS — R188 Other ascites: Secondary | ICD-10-CM | POA: Diagnosis not present

## 2020-11-09 DIAGNOSIS — N179 Acute kidney failure, unspecified: Secondary | ICD-10-CM | POA: Diagnosis not present

## 2020-11-09 DIAGNOSIS — K819 Cholecystitis, unspecified: Secondary | ICD-10-CM

## 2020-11-09 DIAGNOSIS — J189 Pneumonia, unspecified organism: Secondary | ICD-10-CM | POA: Diagnosis not present

## 2020-11-09 DIAGNOSIS — I4891 Unspecified atrial fibrillation: Secondary | ICD-10-CM | POA: Diagnosis not present

## 2020-11-09 HISTORY — PX: IR PARACENTESIS: IMG2679

## 2020-11-09 LAB — COMPREHENSIVE METABOLIC PANEL
ALT: 9 U/L (ref 0–44)
AST: 33 U/L (ref 15–41)
Albumin: 1.7 g/dL — ABNORMAL LOW (ref 3.5–5.0)
Alkaline Phosphatase: 123 U/L (ref 38–126)
Anion gap: 12 (ref 5–15)
BUN: 72 mg/dL — ABNORMAL HIGH (ref 8–23)
CO2: 19 mmol/L — ABNORMAL LOW (ref 22–32)
Calcium: 8.1 mg/dL — ABNORMAL LOW (ref 8.9–10.3)
Chloride: 109 mmol/L (ref 98–111)
Creatinine, Ser: 2.34 mg/dL — ABNORMAL HIGH (ref 0.44–1.00)
GFR, Estimated: 20 mL/min — ABNORMAL LOW (ref >60)
Glucose, Bld: 172 mg/dL — ABNORMAL HIGH (ref 70–99)
Potassium: 3.2 mmol/L — ABNORMAL LOW (ref 3.5–5.1)
Sodium: 140 mmol/L (ref 135–145)
Total Bilirubin: 1 mg/dL (ref 0.3–1.2)
Total Protein: 5.3 g/dL — ABNORMAL LOW (ref 6.5–8.1)

## 2020-11-09 LAB — GLUCOSE, CAPILLARY
Glucose-Capillary: 151 mg/dL — ABNORMAL HIGH (ref 70–99)
Glucose-Capillary: 133 mg/dL — ABNORMAL HIGH (ref 70–99)
Glucose-Capillary: 156 mg/dL — ABNORMAL HIGH (ref 70–99)
Glucose-Capillary: 131 mg/dL — ABNORMAL HIGH (ref 70–99)

## 2020-11-09 LAB — CBC
HCT: 36.8 % (ref 36.0–46.0)
Hemoglobin: 12.3 g/dL (ref 12.0–15.0)
MCH: 29.2 pg (ref 26.0–34.0)
MCHC: 33.4 g/dL (ref 30.0–36.0)
MCV: 87.4 fL (ref 80.0–100.0)
Platelets: 80 10*3/uL — ABNORMAL LOW (ref 150–400)
RBC: 4.21 MIL/uL (ref 3.87–5.11)
RDW: 19.2 % — ABNORMAL HIGH (ref 11.5–15.5)
WBC: 13.5 10*3/uL — ABNORMAL HIGH (ref 4.0–10.5)
nRBC: 0.2 % (ref 0.0–0.2)

## 2020-11-09 LAB — PROCALCITONIN: Procalcitonin: 0.68 ng/mL

## 2020-11-09 LAB — LACTIC ACID, PLASMA: Lactic Acid, Venous: 2.5 mmol/L (ref 0.5–1.9)

## 2020-11-09 LAB — MRSA PCR SCREENING: MRSA by PCR: NEGATIVE

## 2020-11-09 MED ORDER — VANCOMYCIN VARIABLE DOSE PER UNSTABLE RENAL FUNCTION (PHARMACIST DOSING): Status: DC

## 2020-11-09 MED ORDER — LIDOCAINE HCL 1 % IJ SOLN
INTRAMUSCULAR | Status: AC
  Filled 2020-11-09: qty 20

## 2020-11-09 MED ORDER — VANCOMYCIN HCL 1500 MG/300ML IV SOLN
1500.0000 mg | Freq: Once | INTRAVENOUS | Status: AC
  Administered 2020-11-09: 13:00:00 1500 mg via INTRAVENOUS
  Filled 2020-11-09: qty 300

## 2020-11-09 MED ORDER — SODIUM CHLORIDE 0.9 % IV SOLN
2.0000 g | INTRAVENOUS | Status: DC
  Administered 2020-11-09: 13:00:00 2 g via INTRAVENOUS
  Filled 2020-11-09: qty 2

## 2020-11-09 MED ORDER — LIDOCAINE HCL (PF) 1 % IJ SOLN
INTRAMUSCULAR | Status: DC | PRN
  Administered 2020-11-09: 10 mL

## 2020-11-09 MED ORDER — ONDANSETRON HCL 4 MG/2ML IJ SOLN
4.0000 mg | Freq: Four times a day (QID) | INTRAMUSCULAR | Status: DC | PRN
  Administered 2020-11-09 – 2020-11-15 (×3): 4 mg via INTRAVENOUS
  Filled 2020-11-09 (×3): qty 2

## 2020-11-09 MED ORDER — POTASSIUM CHLORIDE CRYS ER 20 MEQ PO TBCR
40.0000 meq | EXTENDED_RELEASE_TABLET | Freq: Once | ORAL | Status: AC
  Administered 2020-11-09: 17:00:00 20 meq via ORAL
  Filled 2020-11-09 (×2): qty 2

## 2020-11-09 MED ORDER — ALBUMIN HUMAN 25 % IV SOLN
12.5000 g | Freq: Once | INTRAVENOUS | Status: DC
  Filled 2020-11-09: qty 50

## 2020-11-09 NOTE — Procedures (Signed)
Interventional Radiology Procedure Note  Procedure: Placement of a right basilic double lumen Picc.  Tip is positioned at the superior cavoatrial junction and catheter is ready for immediate use.  Complications: None Recommendations:  - Ok to use - Do not submerge - Routine line care   Signed,  Dulcy Fanny. Earleen Newport, DO

## 2020-11-09 NOTE — Progress Notes (Signed)
PROGRESS NOTE  Whitney [name] XNT:[id number] DOB: [birth year] DOA: 11/08/2020 PCP: Patient, No Pcp Per  HPI/Recap of past 23 hours: 85 year old female with past medical history of poorly controlled diabetes mellitus, cirrhosis, stage III chronic kidney disease who had a recent admission for emphysematous cystitis/pyelitis due to ESBL Klebsiella plus status post appendectomy for appendicitis last month who presented to the emergency room on 12/17 with complaints of abdominal pain developing suddenly.  Patient's previous hospitalization course was complicated by recurrent abdominal pain and imaging suggested acute cholecystitis although interpretation was challenging in the setting of her anasarca.  At that time, she has not felt to be a candidate for surgery due to frailty or placement of drainage tube as concerns for long-term infectious complications in the setting of her ascites.  In the emergency room, she was found to be in shock with secondary acute kidney injury, lactic acidosis and, shock suspected to be from sepsis from an abdominal source.  She was given fluids, IV antibiotics and started on pressor support.  CT of abdomen and pelvis noted ascites with suspected secondary peritonitis, bowel wall thickening, cholelithiasis and emphysematous pyelonephritis.    Placed in the ICU to the critical care service.  Urology consulted and it was felt that her ascites were not related to her past UTI and only recommendation was antibiotics also cover possible urinary bacteria if present.  Given comorbidities and past history, palliative care consulted, as they have seen patient in the past, however patient's daughter refused this consultation.  General surgery consulted but did not see a significant inflammation of the intestines or areas of abdomen that would point to source requiring surgery.  It was recommended by all for continued antibiotics, follow-up cultures and see how she progresses.  Patient underwent  a HIDA scan on 12/20 which noted cystic duct obstruction and acute cholecystitis and general surgery followed up recommending percutaneous cholecystostomy tube placement.  Interventional radiology followed up and patient underwent therapeutic paracentesis (with 3.5 L of fluid removed) prior to percutaneous cholecystostomy tube placement on 12/21 without incident.  Fluid cultures grew out Klebsiella and antibiotics changed from broad-spectrum meropenem to ceftriaxone and Flagyl.  Infectious disease recommended that drain should remain indefinitely as well as antibiotics should be 14 days of p.o. therapy after clinical improvement on IV antibiotics.  White blood cell count improved and patient started on clear liquids.  With this improvement, patient transferred to stepdown and to the hospitalist service as of 12/25.    Since then, drain has remained in place.  Patient started to have reaccumulation of ascites and interventional radiology did a repeat paracentesis-therapeutic on 12/30 removing 4.5 L of fluid.  Patient's white blood cell count had started to trend upward with mild elevation procalcitonin level despite being on IV antibiotics to cover for SBP.  Chest x-ray noted questionable loculated effusion and CT scan noted no empyema, but did confirm pneumonia.  Patient started on IV vancomycin and cefepime for healthcare associated pneumonia.  Assessment/Plan: Principal Problem: Klebsiella septic shock from acute on chronic cholecystitis, present on admission: Patient met criteria with lactic acidosis, gallbladder source, hypotension requiring pressor support: She has been able to be weaned off of pressors although still on midodrine.  Initial plan was to continue IV antibiotics until she was more stable and then to change over to p.o. antibiotics x14 days, however with worsening white count and now finding of pneumonia, patient transitioned from IV Rocephin to IV cefepime and vancomycin.   Active  Problems:  Uncontrolled diabetes mellitus (HCC): A1c almost 3 months ago at 7.8.  Steroids tapered off  Healthcare associated pneumonia: Currently patient is not septic.  Her lactic acidosis is more from intravascular volume depletion.  Treating this pneumonia with IV vancomycin and cefepime.  Acute kidney injury in the setting of CKD (chronic kidney disease), stage IV (HCC): Creatinine at 2 with GFR of 24.  Not much change during this hospitalization.  Initially mention of stage III chronic kidney disease.  Review of previous records actually indicate closer to a stage IV nephropathy with a GFR of under 30.  About the same from previous day  Chronic atrial fibrillation: Stable, rate controlled  Malnutrition: Patient at high risk given multiple admissions and poor p.o. intake.  Now that she is being allowed to eat, diet advanced plus boost breeze 3 times daily plus Prosource twice daily plus multivitamin.  Poor appetite may be due to uremia from worsening renal function.    Pressure injury of skin: Decubitus ulcers, present on admission.  Patient with air mattress.  Wound care    Other ascites: Chronic.  M try to trial of diuresis with IV albumin.  Unfortunately, urine output was mild and with worsening renal function, have started IV fluids.  Underwent second paracentesis on 12/30 removing 4.5 L of fluid.    Obesity (BMI 30-39.9): Meets criteria for BMI greater than 30.    Cirrhosis (HCC)  Coagulopathy: Secondary to her cirrhosis.  INR at 1.7 previously.  Will recheck in the morning.  Code Status: Had been DNR in the past, changed to full code by daughter and confirmed.  Patient's daughter does not want palliative care involved.  Family Communication: Updated daughter by phone.  Disposition Plan: Unclear.  She has been bedbound for years.  Patient's daughter would like to make sure that she comes home although skilled nursing facility not ruled out completely by daughter.  Active issues:  Physical therapy evaluation. Improvement of her pneumonia Advancement and tolerating of her diet   Consultants:  Critical Care  Interventional Radiology  Palliative Care (cancelled)  Urology   General Surgery  Procedures: Paracentesis 12/18 Arterial line 12/17 >> 12/21 IR PERC cholecystectomy and para 12/21  Repeat para 12/23  Central line placed 12/18-12/24 Therapeutic paracentesis done 12/30. PICC line placed 12/30  Antimicrobials:  IV meropenem 12/17-12/23  IV Flagyl 12/23-12/30  IV Rocephin 12/23-12/30  IV vancomycin 12/30-present  IV cefepime 12/30-present  DVT prophylaxis: SCDs   Objective: Vitals:   11/09/20 1018 11/09/20 1225  BP: 111/69 95/62  Pulse:  (!) 108  Resp:  18  Temp:    SpO2:  97%    Intake/Output Summary (Last 24 hours) at 11/09/2020 1506 Last data filed at 11/09/2020 1019 Gross per 24 hour  Intake 10 ml  Output 4525 ml  Net -4515 ml   Filed Weights   11/07/20 0500 11/08/20 0428 11/09/20 0551  Weight: 76.2 kg 78.9 kg 77.1 kg   Body mass index is 32.12 kg/m.  Exam:   General: Awake, oriented x2, mild distress and feeling weak  HEENT: Normocephalic and atraumatic, mucous members are dry, poor dentition  Cardiovascular: Irregular rhythm, borderline tachycardia  Respiratory: Decreased breath sounds throughout secondary to body habitus  Abdomen: Soft, distended, tender in upper abdominal areas hypoactive bowel sounds, drain in place  Musculoskeletal: No clubbing or cyanosis, 1+ pitting edema  Skin: Decubitus ulcers, present on admission  Psychiatry: Appears appropriate, no evidence of psychoses   Data Reviewed: CBC: Recent Labs  Lab 11/05/20  7517 11/06/20 0141 11/07/20 0404 11/08/20 0127 11/09/20 0118  WBC 8.0 13.3* 15.0* 13.9* 13.5*  HGB 11.1* 11.4* 11.8* 11.3* 12.3  HCT 32.0* 34.2* 35.0* 33.2* 36.8  MCV 85.1 87.7 87.3 86.7 87.4  PLT PLATELET CLUMPS NOTED ON SMEAR, UNABLE TO ESTIMATE 94* 92* 96* 80*    Basic Metabolic Panel: Recent Labs  Lab 11/04/20 0117 11/05/20 0256 11/06/20 0141 11/07/20 0404 11/08/20 0127 11/09/20 0118  NA 144 143 144 142 142 140  K 3.2* 3.3* 2.7* 3.2* 3.0* 3.2*  CL 110 111 113* 110 111 109  CO2 21* 20* 20* 20* 22 19*  GLUCOSE 213* 246* 290* 232* 234* 172*  BUN 51* 57* 63* 67* 70* 72*  CREATININE 2.04* 2.12* 2.24* 2.33* 2.36* 2.34*  CALCIUM 7.6* 7.9* 7.8* 7.9* 7.9* 8.1*  MG 1.8  --   --   --   --   --   PHOS 3.8  --   --   --   --   --    GFR: Estimated Creatinine Clearance: 17.1 mL/min (A) (by C-G formula based on SCr of 2.34 mg/dL (H)). Liver Function Tests: Recent Labs  Lab 11/05/20 0256 11/06/20 0141 11/07/20 0404 11/08/20 0127 11/09/20 0118  AST _0 33  ALT _1 ALKPHOS 67 69 74 89 123  BILITOT 0.8 0.4 0.5 0.5 1.0  PROT 5.5* 5.0* 5.2* 5.0* 5.3*  ALBUMIN 1.7* 1.7* 1.7* 1.6* 1.7*   No results for input(s): LIPASE, AMYLASE in the last 168 hours. Recent Labs  Lab 11/05/20 0258  AMMONIA 39*   Coagulation Profile: Recent Labs  Lab 11/05/20 0256  INR 1.7*   Cardiac Enzymes: No results for input(s): CKTOTAL, CKMB, CKMBINDEX, TROPONINI in the last 168 hours. BNP (last 3 results) No results for input(s): PROBNP in the last 8760 hours. HbA1C: No results for input(s): HGBA1C in the last 72 hours. CBG: Recent Labs  Lab 11/08/20 1147 11/08/20 1715 11/08/20 2136 11/09/20 0604 11/09/20 1232  GLUCAP 189* 151* 160* 151* 133*   Lipid Profile: No results for input(s): CHOL, HDL, LDLCALC, TRIG, CHOLHDL, LDLDIRECT in the last 72 hours. Thyroid Function Tests: No results for input(s): TSH, T4TOTAL, FREET4, T3FREE, THYROIDAB in the last 72 hours. Anemia Panel: No results for input(s): VITAMINB12, FOLATE, FERRITIN, TIBC, IRON, RETICCTPCT in the last 72 hours. Urine analysis:    Component Value Date/Time   COLORURINE AMBER (A) 11/07/2020 2156   APPEARANCEUR TURBID (A) 11/07/2020 2156   LABSPEC 1.017 11/07/2020 2156    PHURINE 5.0 11/07/2020 2156   GLUCOSEU NEGATIVE 11/07/2020 2156   HGBUR MODERATE (A) 11/07/2020 2156   [place] NEGATIVE 11/07/2020 2156   KETONESUR 5 (A) 11/07/2020 2156   PROTEINUR 100 (A) 11/07/2020 2156   NITRITE NEGATIVE 11/07/2020 2156   LEUKOCYTESUR LARGE (A) 11/07/2020 2156   Sepsis Labs: _2 (procalcitonin:4,lacticidven:4)  ) Recent Results (from the past 240 hour(s))  Aerobic/Anaerobic Culture (surgical/deep wound)     Status: None   Collection Time: 10/31/20  5:22 PM   Specimen: Abscess  Result Value Ref Range Status   Specimen Description ABSCESS GALL BLADDER  Final   Special Requests NONE  Final   Gram Stain   Final    MODERATE WBC PRESENT,BOTH PMN AND MONONUCLEAR FEW GRAM NEGATIVE RODS FEW GRAM POSITIVE COCCI IN PAIRS IN CHAINS    Culture   Final    ABUNDANT KLEBSIELLA PNEUMONIAE NO ANAEROBES ISOLATED Performed at [facility], [place] [address] [place], [place] [address]  Report Status 11/03/2020 FINAL  Final   Organism ID, Bacteria KLEBSIELLA PNEUMONIAE  Final      Susceptibility   Klebsiella pneumoniae - MIC*    AMPICILLIN >=32 RESISTANT Resistant     CEFAZOLIN <=4 SENSITIVE Sensitive     CEFEPIME <=0.12 SENSITIVE Sensitive     CEFTAZIDIME <=1 SENSITIVE Sensitive     CEFTRIAXONE <=0.25 SENSITIVE Sensitive     CIPROFLOXACIN <=0.25 SENSITIVE Sensitive     GENTAMICIN <=1 SENSITIVE Sensitive     IMIPENEM <=0.25 SENSITIVE Sensitive     TRIMETH/SULFA <=20 SENSITIVE Sensitive     AMPICILLIN/SULBACTAM 4 SENSITIVE Sensitive     PIP/TAZO <=4 SENSITIVE Sensitive     * ABUNDANT KLEBSIELLA PNEUMONIAE  Gram stain     Status: None   Collection Time: 11/02/20  1:04 PM   Specimen: Abdomen; Peritoneal Fluid  Result Value Ref Range Status   Specimen Description FLUID PERITONEAL  Final   Special Requests NONE  Final   Gram Stain   Final    FEW WBC PRESENT, PREDOMINANTLY MONONUCLEAR NO ORGANISMS SEEN Performed at [facility], [address], [place], [place] [address]    Report Status 11/02/2020 FINAL  Final  Culture, body fluid-bottle     Status: None   Collection Time: 11/02/20  1:04 PM   Specimen: BLOOD  Result Value Ref Range Status   Specimen Description BLOOD PERITONEAL  Final   Special Requests BOTTLES DRAWN AEROBIC AND ANAEROBIC 10CC  Final   Culture   Final    NO GROWTH 5 DAYS Performed at [facility], [place] [address], [place], [place] [address]    Report Status 11/07/2020 FINAL  Final      Studies: CT CHEST WO CONTRAST  Result Date: 11/08/2020 CLINICAL DATA:  Empyema. A loculated pleural effusions seen on chest x-ray. EXAM: CT CHEST WITHOUT CONTRAST TECHNIQUE: Multidetector CT imaging of the chest was performed following the standard protocol without IV contrast. COMPARISON:  Chest radiograph 11/07/2020 FINDINGS: Cardiovascular: Normal heart size. No pericardial effusions. Coronary artery calcifications. Scattered aortic calcification. Normal caliber thoracic aorta. Mediastinum/Nodes: Mediastinal lymph nodes are present without pathologically enlarged mint. Likely reactive. Visualized thyroid gland is unremarkable. The esophagus is decompressed. Lungs/Pleura: There are bilateral pleural effusions, greater on the left. No obvious loculation is identified. Consolidation or atelectasis in the lung bases, greater on the left. This is likely pneumonia. Patchy areas of fibrosis and airspace disease throughout both lungs. Airways are patent. No pneumothorax. Upper Abdomen: Large upper abdominal ascites. Musculoskeletal: Diffuse bone demineralization. Degenerative changes in the spine. IMPRESSION: 1. Bilateral pleural effusions and consolidations, greater on the left. This is likely pneumonia. Patchy areas of fibrosis and airspace disease throughout both lungs. 2. Large upper abdominal ascites. 3. Aortic atherosclerosis. Aortic Atherosclerosis (ICD10-I70.0). Electronically Signed   By: Lucienne Capers M.D.   On: 11/08/2020  18:13   IR PICC PLACEMENT RIGHT >5 YRS INC IMG GUIDE  Result Date: 11/09/2020 INDICATION: 85 year old female referred for PICC placement EXAM: PICC LINE PLACEMENT WITH ULTRASOUND AND FLUOROSCOPIC GUIDANCE MEDICATIONS: None ANESTHESIA/SEDATION: None FLUOROSCOPY TIME:  Fluoroscopy Time: 0 minutes 6 seconds (2 mGy). COMPLICATIONS: None PROCEDURE: Informed written consent was obtained from the patient's daughter after a thorough discussion of the procedural risks, benefits and alternatives. All questions were addressed. Maximal Sterile Barrier Technique was utilized including caps, mask, sterile gowns, sterile gloves, sterile drape, hand hygiene and skin antiseptic. A timeout was performed prior to the initiation of the procedure. Patient was position in  the supine position on the fluoroscopy table with the right arm abducted 90 degrees. Ultrasound survey of the upper extremity was performed with images stored and sent to PACs. The right basilic vein was selected for access. Once the patient was prepped and draped in the usual sterile fashion, the skin and subcutaneous tissues were generously infiltrated with 1% lidocaine for local anesthesia. A micropuncture access kit was then used to access the targeted vein. Wire was passed centrally, confirmed to be within the venous system under fluoroscopy. A small stab incision was made with an 11 blade scalpel and the sheath was then placed over the wire. Estimated length of the catheter was then performed with the indwelling wire. Catheter was amputated at 38 cm length and placed with coaxial wire through the peel-away. Double lumen, power injectable PICC in the basilic vein. Tip confirmed at the cavoatrial junction, and the catheter is ready for use. Stat lock was placed. Patient tolerated the procedure well and remained hemodynamically stable throughout. No complications were encountered and no significant blood loss was encountered. IMPRESSION: Status post right  basilic vein double-lumen PICC. Catheter ready for use. Signed, Dulcy Fanny. Dellia Nims, RPVI [facility] Radiology Electronically Signed   By: Corrie Mckusick D.O.   On: 11/09/2020 11:43   [place] EKG SITE RITE  Result Date: 11/08/2020 If Site Rite image not attached, placement could not be confirmed due to current cardiac rhythm.  IR Paracentesis  Result Date: 11/09/2020 INDICATION: Patient history of septic shock secondary to intra-abdominal source (presumed peritonitis), decompensated nonspecific cirrhosis, abdominal distension, and recurrent ascites. Request is made for therapeutic paracentesis. EXAM: ULTRASOUND GUIDED THERAPEUTIC PARACENTESIS MEDICATIONS: 10 mL 1% lidocaine COMPLICATIONS: None immediate. PROCEDURE: Informed written consent was obtained from the patient after a discussion of the risks, benefits and alternatives to treatment. A timeout was performed prior to the initiation of the procedure. Initial ultrasound scanning demonstrates a large amount of ascites within the left lower abdominal quadrant. The left lower abdomen was prepped and draped in the usual sterile fashion. 1% lidocaine was used for local anesthesia. Following this, a 19 gauge, 7-cm, Yueh catheter was introduced. An ultrasound image was saved for documentation purposes. The paracentesis was performed. The catheter was removed and a dressing was applied. The patient tolerated the procedure well without immediate post procedural complication. FINDINGS: A total of approximately 4.5 L of clear gold fluid was removed. IMPRESSION: Successful ultrasound-guided paracentesis yielding 4.5 L of peritoneal fluid. Read by: Earley Abide, PA-C Electronically Signed   By: Jacqulynn Cadet M.D.   On: 11/09/2020 10:21    Scheduled Meds: . (feeding supplement) PROSource Plus  30 mL Oral TID BM  . Chlorhexidine Gluconate Cloth  6 each Topical Daily  . diltiazem  60 mg Oral Q12H  . escitalopram   10 mg Oral Daily  . insulin aspart  0-9 Units Subcutaneous TID WC & HS  . mouth rinse  15 mL Mouth Rinse BID  . midodrine  10 mg Oral TID WC  . multivitamin with minerals  1 tablet Oral Daily  . potassium chloride  40 mEq Oral Once  . sodium chloride flush  10-40 mL Intracatheter Q12H  . sodium chloride flush  5 mL Intracatheter Q8H  . vancomycin variable dose per unstable renal function (pharmacist dosing)   Does not apply See admin instructions    Continuous Infusions: . sodium chloride 10 mL/hr at 11/01/20 1836  . ceFEPime (MAXIPIME) IV 2 g (11/09/20 1242)  .  lactated ringers 1,000 mL with potassium chloride 40 mEq infusion 100 mL/hr at 11/09/20 1305  . vancomycin 1,500 mg (11/09/20 1309)     LOS: 13 days     Annita Brod, MD [facility]   11/09/2020, 3:06 PM

## 2020-11-09 NOTE — Progress Notes (Signed)
Pharmacy Antibiotic Note  Whitney [name] is a 85 y.o. female admitted on 10/14/2020 with abdominal pain. Today's LA is 2.5 and patient is being treated for  pneumonia.  Pharmacy has been consulted for vancomycin and cefepime dosing. Most recent WBC is 13.5, procal 0.68, and Scr is increased to 2.34. CT of chest on 11/29 showed bilateral effusions believed to be pneumonia. Patient is currently afebrile.   Plan: Vancomycin 1500mg  x1 today  Input order for variable dosing per unstable renal function (Scr has bumped today, evaluate on 12/31) Cefepime 2G Q24 Monitor renal function and clinical status  Height: 5\' 1"  (154.9 cm) Weight: 77.1 kg (170 lb) IBW/kg (Calculated) : 47.8  Temp (24hrs), Avg:97.4 F (36.3 C), Min:97.4 F (36.3 C), Max:97.5 F (36.4 C)  Recent Labs  Lab 11/05/20 0256 11/06/20 0141 11/07/20 0404 11/08/20 0127 11/08/20 0601 11/09/20 0118  WBC 8.0 13.3* 15.0* 13.9*  --  13.5*  CREATININE 2.12* 2.24* 2.33* 2.36*  --  2.34*  LATICACIDVEN  --   --   --  2.1* 1.9 2.5*    Estimated Creatinine Clearance: 17.1 mL/min (A) (by C-G formula based on SCr of 2.34 mg/dL (H)).    Allergies  Allergen Reactions   Succinylcholine Other (See Comments)    No formal diagnosis of pseudocholinesterase deficiency; however, prolonged paralysis during anesthetics on 09/18/20 and 08/18/20.    Milk-Related Compounds Diarrhea    Antimicrobials this admission: Vanc 12/18 x1 CTX 12/23 >> 12/29 Meropenem 12/18>> 12/23 Flagyl 12/23 >> 12/29  Dose adjustments this admission:   Microbiology results: 12/23 BCx: ngtd 12/21 Gall bladder abscess: Kleb pneumon 12/17 peritoneal fluid: kleb pneumo  12/18 MRSA PCR: neg 12/17 BCID: staph epi x1  Thank you for allowing pharmacy to be a part of this patients care.  Cephus Slater, PharmD, MBA Pharmacy Resident [phone] 11/09/2020 9:05 AM

## 2020-11-09 NOTE — Progress Notes (Addendum)
CRITICAL VALUE ALERT  Critical Value: Lactic Acid 2.5  Date & Time Notied:  11/09/20 0209  Provider Notified: Dr. Myna Hidalgo  Orders Received/Actions taken: Dr. Myna Hidalgo ordered Albumin 25% solution 12.5g. MD notified that pt lost IV access and PICC can not be placed until day shift. Albumin order discontinued.   Will continue to monitor.  Adella Hare, RN

## 2020-11-09 NOTE — Procedures (Signed)
PROCEDURE SUMMARY:  Successful image-guided paracentesis from the left lateral abdomen.  Yielded 4.5 liters of clear gold fluid.  No immediate complications.  EBL < 1 mL. Patient tolerated well.   Specimen was not sent for labs.  Tele-interpreter [phone] was used throughout today's interaction.  Please see imaging section of Epic for full dictation.   Claris Pong Derionna Salvador PA-C 11/09/2020 9:51 AM

## 2020-11-10 DIAGNOSIS — J189 Pneumonia, unspecified organism: Secondary | ICD-10-CM | POA: Diagnosis not present

## 2020-11-10 DIAGNOSIS — I4891 Unspecified atrial fibrillation: Secondary | ICD-10-CM | POA: Diagnosis not present

## 2020-11-10 DIAGNOSIS — N179 Acute kidney failure, unspecified: Secondary | ICD-10-CM | POA: Diagnosis not present

## 2020-11-10 DIAGNOSIS — R188 Other ascites: Secondary | ICD-10-CM | POA: Diagnosis not present

## 2020-11-10 LAB — CBC
HCT: 32.1 % — ABNORMAL LOW (ref 36.0–46.0)
Hemoglobin: 10.2 g/dL — ABNORMAL LOW (ref 12.0–15.0)
MCH: 28.2 pg (ref 26.0–34.0)
MCHC: 31.8 g/dL (ref 30.0–36.0)
MCV: 88.7 fL (ref 80.0–100.0)
Platelets: 71 10*3/uL — ABNORMAL LOW (ref 150–400)
RBC: 3.62 MIL/uL — ABNORMAL LOW (ref 3.87–5.11)
RDW: 19.4 % — ABNORMAL HIGH (ref 11.5–15.5)
WBC: 15.7 10*3/uL — ABNORMAL HIGH (ref 4.0–10.5)
nRBC: 0.3 % — ABNORMAL HIGH (ref 0.0–0.2)

## 2020-11-10 LAB — COMPREHENSIVE METABOLIC PANEL
ALT: 11 U/L (ref 0–44)
AST: 36 U/L (ref 15–41)
Albumin: 1.5 g/dL — ABNORMAL LOW (ref 3.5–5.0)
Alkaline Phosphatase: 152 U/L — ABNORMAL HIGH (ref 38–126)
Anion gap: 9 (ref 5–15)
BUN: 70 mg/dL — ABNORMAL HIGH (ref 8–23)
CO2: 21 mmol/L — ABNORMAL LOW (ref 22–32)
Calcium: 8 mg/dL — ABNORMAL LOW (ref 8.9–10.3)
Chloride: 113 mmol/L — ABNORMAL HIGH (ref 98–111)
Creatinine, Ser: 2.36 mg/dL — ABNORMAL HIGH (ref 0.44–1.00)
GFR, Estimated: 20 mL/min — ABNORMAL LOW (ref >60)
Glucose, Bld: 140 mg/dL — ABNORMAL HIGH (ref 70–99)
Potassium: 4.4 mmol/L (ref 3.5–5.1)
Sodium: 143 mmol/L (ref 135–145)
Total Bilirubin: 1.3 mg/dL — ABNORMAL HIGH (ref 0.3–1.2)
Total Protein: 4.5 g/dL — ABNORMAL LOW (ref 6.5–8.1)

## 2020-11-10 LAB — PROCALCITONIN: Procalcitonin: 0.46 ng/mL

## 2020-11-10 LAB — GLUCOSE, CAPILLARY
Glucose-Capillary: 124 mg/dL — ABNORMAL HIGH (ref 70–99)
Glucose-Capillary: 118 mg/dL — ABNORMAL HIGH (ref 70–99)
Glucose-Capillary: 122 mg/dL — ABNORMAL HIGH (ref 70–99)
Glucose-Capillary: 136 mg/dL — ABNORMAL HIGH (ref 70–99)

## 2020-11-10 LAB — LACTIC ACID, PLASMA: Lactic Acid, Venous: 1.6 mmol/L (ref 0.5–1.9)

## 2020-11-10 MED ORDER — SODIUM CHLORIDE 0.9 % IV BOLUS
500.0000 mL | Freq: Once | INTRAVENOUS | Status: AC
  Administered 2020-11-10: 500 mL via INTRAVENOUS

## 2020-11-10 MED ORDER — PIPERACILLIN-TAZOBACTAM IN DEX 2-0.25 GM/50ML IV SOLN
2.2500 g | Freq: Three times a day (TID) | INTRAVENOUS | Status: DC
  Filled 2020-11-10: qty 50

## 2020-11-10 MED ORDER — PIPERACILLIN-TAZOBACTAM IN DEX 2-0.25 GM/50ML IV SOLN
2.2500 g | Freq: Three times a day (TID) | INTRAVENOUS | Status: DC
  Administered 2020-11-10 – 2020-11-13 (×9): 2.25 g via INTRAVENOUS
  Filled 2020-11-10 (×12): qty 50

## 2020-11-10 NOTE — Progress Notes (Signed)
Pt's temperature was down to 90.2 F and systolic down to high 40'X to low 90's. Gevena Barre MD notified.  New orders given for bear hugger and 53mL NS bolus.

## 2020-11-10 NOTE — Progress Notes (Signed)
Pharmacy Antibiotic Note  Whitney [name] is a 85 y.o. female admitted on 11/03/2020 with HCAP / septic gall bladder. Change in antibiotic regimen today 12/31 to Zosyn.  With renal insufficiency.   WBC 15.7, afebrile  CTX/Metronidazole 12/23 >>12/30 Meropenem 12/17 >> 12/23 Vancomycin 12/18 x1 Vanc 12/30>> 12/31 Cefepime 12/30>> 12/31  Zosyn 12/31>   Plan: Zosyn 2.25 grams iv Q 8 hours Continue to follow  Height: 5\' 1"  (154.9 cm) Weight: 79 kg (174 lb 3.3 oz) IBW/kg (Calculated) : 47.8  Temp (24hrs), Avg:98.3 F (36.8 C), Min:97.8 F (36.6 C), Max:98.7 F (37.1 C)  Recent Labs  Lab 11/06/20 0141 11/07/20 0404 11/08/20 0127 11/08/20 0601 11/09/20 0118 11/10/20 0556  WBC 13.3* 15.0* 13.9*  --  13.5* 15.7*  CREATININE 2.24* 2.33* 2.36*  --  2.34* 2.36*  LATICACIDVEN  --   --  2.1* 1.9 2.5* 1.6    Estimated Creatinine Clearance: 17.2 mL/min (A) (by C-G formula based on SCr of 2.36 mg/dL (H)).    Allergies  Allergen Reactions  . Succinylcholine Other (See Comments)    No formal diagnosis of pseudocholinesterase deficiency; however, prolonged paralysis during anesthetics on 09/18/20 and 08/18/20.   . Milk-Related Compounds Diarrhea    Thank you Anette Guarneri, PharmD  11/10/2020 9:06 AM

## 2020-11-10 NOTE — Progress Notes (Signed)
PROGRESS NOTE  Whitney [name] OBS:[id number] DOB: 04/13/1935 DOA: 10/23/2020 PCP: Patient, No Pcp Per  HPI/Recap of past 32 hours: 85 year old female with past medical history of poorly controlled diabetes mellitus, cirrhosis, stage III chronic kidney disease who had a recent admission for emphysematous cystitis/pyelitis due to ESBL Klebsiella plus status post appendectomy for appendicitis last month who presented to the emergency room on 12/17 with complaints of abdominal pain developing suddenly.  Patient's previous hospitalization course was complicated by recurrent abdominal pain and imaging suggested acute cholecystitis although interpretation was challenging in the setting of her anasarca.  At that time, she has not felt to be a candidate for surgery due to frailty or placement of drainage tube as concerns for long-term infectious complications in the setting of her ascites.  In the emergency room, she was found to be in shock with secondary acute kidney injury, lactic acidosis and, shock suspected to be from sepsis from an abdominal source.  She was given fluids, IV antibiotics and started on pressor support.  CT of abdomen and pelvis noted ascites with suspected secondary peritonitis, bowel wall thickening, cholelithiasis and emphysematous pyelonephritis.    Placed in the ICU to the critical care service.  Urology consulted and it was felt that her ascites were not related to her past UTI and only recommendation was antibiotics also cover possible urinary bacteria if present.  Given comorbidities and past history, palliative care consulted, as they have seen patient in the past, however patient's daughter refused this consultation.  General surgery consulted but did not see a significant inflammation of the intestines or areas of abdomen that would point to source requiring surgery.  It was recommended by all for continued antibiotics, follow-up cultures and see how she progresses.  Patient underwent  a HIDA scan on 12/20 which noted cystic duct obstruction and acute cholecystitis and general surgery followed up recommending percutaneous cholecystostomy tube placement.  Interventional radiology followed up and patient underwent therapeutic paracentesis (with 3.5 L of fluid removed) prior to percutaneous cholecystostomy tube placement on 12/21 without incident.  Fluid cultures grew out Klebsiella and antibiotics changed from broad-spectrum meropenem to ceftriaxone and Flagyl.  Infectious disease recommended that drain should remain indefinitely as well as antibiotics should be 14 days of p.o. therapy after clinical improvement on IV antibiotics.  White blood cell count improved and patient started on clear liquids.  With this improvement, patient transferred to stepdown and to the hospitalist service as of 12/25.    Since then, drain has remained in place.  Patient started to have reaccumulation of ascites and interventional radiology did a repeat paracentesis-therapeutic on 12/30 removing 4.5 L of fluid.  Patient's white blood cell count had started to trend upward with mild elevation procalcitonin level despite being on IV antibiotics to cover for SBP.  Chest x-ray noted questionable loculated effusion and CT scan noted no empyema, but did confirm pneumonia.  Patient started on IV vancomycin and cefepime for healthcare associated pneumonia on 12/30.  Cefepime changed to Zosyn on 12/31.  Pt about same today, some abdominal pain and decreased appetite.  Assessment/Plan: Principal Problem: Klebsiella septic shock from acute on chronic cholecystitis, present on admission: Patient met criteria with lactic acidosis, gallbladder source, hypotension requiring pressor support: She has been able to be weaned off of pressors although still on midodrine.  Initial plan was to continue IV antibiotics until she was more stable and then to change over to p.o. antibiotics x14 days, however with worsening white count  and  now finding of pneumonia, patient transitioned from IV Rocephin to IV cefepime and vancomycin.  Her lactic acid and procalcitonin much improved today although white count actually went up slightly.  Felt that Zosyn would cover both her pneumonia as well as her chronic gallbladder infection better so changed from cefepime to Zosyn. Active Problems:   Uncontrolled diabetes mellitus ([place]): A1c almost 3 months ago at 7.8.  Steroids tapered off  Healthcare associated pneumonia: Currently patient is not septic.  Her lactic acidosis is more from intravascular volume depletion.  Treating this pneumonia with IV vancomycin and Zosyn.  Acute kidney injury in the setting of CKD (chronic kidney disease), stage IV ([place]): Creatinine at 2 with GFR of 24.  Not much change during this hospitalization.  Initially mention of stage III chronic kidney disease.  Review of previous records actually indicate closer to a stage IV nephropathy with a GFR of under 30.  Has been staying stable at with a creatinine of 2.34  Chronic atrial fibrillation: Stable, rate controlled  Malnutrition: Patient at high risk given multiple admissions and poor p.o. intake.  Now that she is being allowed to eat, diet advanced plus boost breeze 3 times daily plus Prosource twice daily plus multivitamin.  Poor appetite may be due to uremia from worsening renal function.    Pressure injury of skin: Decubitus ulcers, present on admission.  Patient with air mattress.  Wound care    Other ascites: Chronic.  M try to trial of diuresis with IV albumin.  Unfortunately, urine output was mild and with worsening renal function, have started IV fluids.  Underwent second paracentesis on 12/30 removing 4.5 L of fluid.    Obesity (BMI 30-39.9): Meets criteria for BMI greater than 30.    Cirrhosis ([place])  Coagulopathy: Secondary to her cirrhosis.  INR at 1.7 previously.  Will recheck in the morning.  Code Status: Had been DNR in the past, changed to full  code by daughter and confirmed.  Patient's daughter does not want palliative care involved.  Family Communication: Updated daughter by phone.  Disposition Plan: Unclear.  She has been bedbound for years.  Patient's daughter would like to make sure that she comes home although skilled nursing facility not ruled out completely by daughter.  Active issues: Physical therapy evaluation. Improvement of infections Advancement and tolerating of her diet   Consultants:  Critical Care  Interventional Radiology  Palliative Care (cancelled)  Urology   General Surgery  Procedures: Paracentesis 12/18 Arterial line 12/17 >> 12/21 IR PERC cholecystectomy and para 12/21  Repeat para 12/23  Central line placed 12/18-12/24 Therapeutic paracentesis done 12/30. PICC line placed 12/30  Antimicrobials:  IV meropenem 12/17-12/23  IV Flagyl 12/23-12/30  IV Rocephin 12/23-12/30  IV vancomycin 12/30-present  IV cefepime 12/30-12/31  IV Zosyn 12/31-present  DVT prophylaxis: SCDs   Objective: Vitals:   11/10/20 1115 11/10/20 1243  BP: (!) 120/57   Pulse: 78 74  Resp: 15 15  Temp:  97.6 F (36.4 C)  SpO2: 95% 94%    Intake/Output Summary (Last 24 hours) at 11/10/2020 1324 Last data filed at 11/10/2020 0600 Gross per 24 hour  Intake 1491.98 ml  Output 21 ml  Net 1470.98 ml   Filed Weights   11/08/20 0428 11/09/20 0551 11/10/20 0310  Weight: 78.9 kg 77.1 kg 79 kg   Body mass index is 32.92 kg/m.  Exam:   General: Awake, oriented x2, mild distress and feeling weak  HEENT: Normocephalic and atraumatic, mucous members are dry,  poor dentition  Cardiovascular: Irregular rhythm, rate controlled  Respiratory: Decreased breath sounds throughout secondary to body habitus  Abdomen: Soft, distended, tender in upper abdominal areas hypoactive bowel sounds, drain in place  Musculoskeletal: No clubbing or cyanosis, 1+ pitting edema  Skin: Decubitus ulcers, present on  admission  Psychiatry: Appears appropriate, no evidence of psychoses   Data Reviewed: CBC: Recent Labs  Lab 11/06/20 0141 11/07/20 0404 11/08/20 0127 11/09/20 0118 11/10/20 0556  WBC 13.3* 15.0* 13.9* 13.5* 15.7*  HGB 11.4* 11.8* 11.3* 12.3 10.2*  HCT 34.2* 35.0* 33.2* 36.8 32.1*  MCV 87.7 87.3 86.7 87.4 88.7  PLT 94* 92* 96* 80* 71*   Basic Metabolic Panel: Recent Labs  Lab 11/04/20 0117 11/05/20 0256 11/06/20 0141 11/07/20 0404 11/08/20 0127 11/09/20 0118 11/10/20 0556  NA 144   < > 144 142 142 140 143  K 3.2*   < > 2.7* 3.2* 3.0* 3.2* 4.4  CL 110   < > 113* 110 111 109 113*  CO2 21*   < > 20* 20* 22 19* 21*  GLUCOSE 213*   < > 290* 232* 234* 172* 140*  BUN 51*   < > 63* 67* 70* 72* 70*  CREATININE 2.04*   < > 2.24* 2.33* 2.36* 2.34* 2.36*  CALCIUM 7.6*   < > 7.8* 7.9* 7.9* 8.1* 8.0*  MG 1.8  --   --   --   --   --   --   PHOS 3.8  --   --   --   --   --   --    < > = values in this interval not displayed.   GFR: Estimated Creatinine Clearance: 17.2 mL/min (A) (by C-G formula based on SCr of 2.36 mg/dL (H)). Liver Function Tests: Recent Labs  Lab 11/06/20 0141 11/07/20 0404 11/08/20 0127 11/09/20 0118 11/10/20 0556  AST 15 17 19  33 36  ALT 7 10 8 9 11   ALKPHOS 69 74 89 123 152*  BILITOT 0.4 0.5 0.5 1.0 1.3*  PROT 5.0* 5.2* 5.0* 5.3* 4.5*  ALBUMIN 1.7* 1.7* 1.6* 1.7* 1.5*   No results for input(s): LIPASE, AMYLASE in the last 168 hours. Recent Labs  Lab 11/05/20 0258  AMMONIA 39*   Coagulation Profile: Recent Labs  Lab 11/05/20 0256  INR 1.7*   Cardiac Enzymes: No results for input(s): CKTOTAL, CKMB, CKMBINDEX, TROPONINI in the last 168 hours. BNP (last 3 results) No results for input(s): PROBNP in the last 8760 hours. HbA1C: No results for input(s): HGBA1C in the last 72 hours. CBG: Recent Labs  Lab 11/09/20 1232 11/09/20 1621 11/09/20 2134 11/10/20 0525 11/10/20 1111  GLUCAP 133* 156* 131* 124* 118*   Lipid Profile: No results  for input(s): CHOL, HDL, LDLCALC, TRIG, CHOLHDL, LDLDIRECT in the last 72 hours. Thyroid Function Tests: No results for input(s): TSH, T4TOTAL, FREET4, T3FREE, THYROIDAB in the last 72 hours. Anemia Panel: No results for input(s): VITAMINB12, FOLATE, FERRITIN, TIBC, IRON, RETICCTPCT in the last 72 hours. Urine analysis:    Component Value Date/Time   COLORURINE AMBER (A) 11/07/2020 2156   APPEARANCEUR TURBID (A) 11/07/2020 2156   LABSPEC 1.017 11/07/2020 2156   PHURINE 5.0 11/07/2020 2156   GLUCOSEU NEGATIVE 11/07/2020 2156   HGBUR MODERATE (A) 11/07/2020 2156   BILIRUBINUR NEGATIVE 11/07/2020 2156   KETONESUR 5 (A) 11/07/2020 2156   PROTEINUR 100 (A) 11/07/2020 2156   NITRITE NEGATIVE 11/07/2020 2156   LEUKOCYTESUR LARGE (A) 11/07/2020 2156   Sepsis Labs: @LABRCNTIP (procalcitonin:4,lacticidven:4)  )  Recent Results (from the past 240 hour(s))  Aerobic/Anaerobic Culture (surgical/deep wound)     Status: None   Collection Time: 10/31/20  5:22 PM   Specimen: Abscess  Result Value Ref Range Status   Specimen Description ABSCESS GALL BLADDER  Final   Special Requests NONE  Final   Gram Stain   Final    MODERATE WBC PRESENT,BOTH PMN AND MONONUCLEAR FEW GRAM NEGATIVE RODS FEW GRAM POSITIVE COCCI IN PAIRS IN CHAINS    Culture   Final    ABUNDANT KLEBSIELLA PNEUMONIAE NO ANAEROBES ISOLATED Performed at [facility], [address], [place], [place] [address]    Report Status 11/03/2020 FINAL  Final   Organism ID, Bacteria KLEBSIELLA PNEUMONIAE  Final      Susceptibility   Klebsiella pneumoniae - MIC*    AMPICILLIN >=32 RESISTANT Resistant     CEFAZOLIN <=4 SENSITIVE Sensitive     CEFEPIME <=0.12 SENSITIVE Sensitive     CEFTAZIDIME <=1 SENSITIVE Sensitive     CEFTRIAXONE <=0.25 SENSITIVE Sensitive     CIPROFLOXACIN <=0.25 SENSITIVE Sensitive     GENTAMICIN <=1 SENSITIVE Sensitive     IMIPENEM <=0.25 SENSITIVE Sensitive     TRIMETH/SULFA <=20 SENSITIVE Sensitive      AMPICILLIN/SULBACTAM 4 SENSITIVE Sensitive     PIP/TAZO <=4 SENSITIVE Sensitive     * ABUNDANT KLEBSIELLA PNEUMONIAE  Gram stain     Status: None   Collection Time: 11/02/20  1:04 PM   Specimen: Abdomen; Peritoneal Fluid  Result Value Ref Range Status   Specimen Description FLUID PERITONEAL  Final   Special Requests NONE  Final   Gram Stain   Final    FEW WBC PRESENT, PREDOMINANTLY MONONUCLEAR NO ORGANISMS SEEN Performed at [facility], [address], [place], [place] [address]    Report Status 11/02/2020 FINAL  Final  Culture, body fluid-bottle     Status: None   Collection Time: 11/02/20  1:04 PM   Specimen: BLOOD  Result Value Ref Range Status   Specimen Description BLOOD PERITONEAL  Final   Special Requests BOTTLES DRAWN AEROBIC AND ANAEROBIC 10CC  Final   Culture   Final    NO GROWTH 5 DAYS Performed at [facility], [place] [address], [place], [place] [address]    Report Status 11/07/2020 FINAL  Final  MRSA PCR Screening     Status: None   Collection Time: 11/09/20  5:41 PM   Specimen: Nasal Mucosa; Nasopharyngeal  Result Value Ref Range Status   MRSA by PCR NEGATIVE NEGATIVE Final    Comment:        The GeneXpert MRSA Assay (FDA approved for NASAL specimens only), is one component of a comprehensive MRSA colonization surveillance program. It is not intended to diagnose MRSA infection nor to guide or monitor treatment for MRSA infections. Performed at [facility], [place] [address], [place],  [address]       Studies: No results found.  Scheduled Meds: . (feeding supplement) PROSource Plus  30 mL Oral TID BM  . Chlorhexidine Gluconate Cloth  6 each Topical Daily  . diltiazem  60 mg Oral Q12H  . escitalopram  10 mg Oral Daily  . insulin aspart  0-9 Units Subcutaneous TID WC & HS  . mouth rinse  15 mL Mouth Rinse BID  . midodrine  10 mg Oral TID WC  . multivitamin with minerals  1 tablet Oral Daily  . sodium chloride flush  10-40 mL  Intracatheter Q12H  . sodium chloride flush  5 mL Intracatheter Q8H    Continuous Infusions: . sodium chloride 10 mL/hr at 11/01/20 1836  . piperacillin-tazobactam (ZOSYN)  IV 2.25 g (11/10/20 1005)     LOS: 14 days     Annita Brod, MD [facility]   11/10/2020, 1:24 PM

## 2020-11-11 ENCOUNTER — Inpatient Hospital Stay (HOSPITAL_COMMUNITY): Payer: 59

## 2020-11-11 DIAGNOSIS — J189 Pneumonia, unspecified organism: Secondary | ICD-10-CM | POA: Diagnosis not present

## 2020-11-11 LAB — CBC
HCT: 28.1 % — ABNORMAL LOW (ref 36.0–46.0)
Hemoglobin: 9.3 g/dL — ABNORMAL LOW (ref 12.0–15.0)
MCH: 29.2 pg (ref 26.0–34.0)
MCHC: 33.1 g/dL (ref 30.0–36.0)
MCV: 88.4 fL (ref 80.0–100.0)
Platelets: 64 10*3/uL — ABNORMAL LOW (ref 150–400)
RBC: 3.18 MIL/uL — ABNORMAL LOW (ref 3.87–5.11)
RDW: 19.9 % — ABNORMAL HIGH (ref 11.5–15.5)
WBC: 12.9 10*3/uL — ABNORMAL HIGH (ref 4.0–10.5)
nRBC: 0.5 % — ABNORMAL HIGH (ref 0.0–0.2)

## 2020-11-11 LAB — COMPREHENSIVE METABOLIC PANEL
ALT: 12 U/L (ref 0–44)
AST: 35 U/L (ref 15–41)
Albumin: 1.4 g/dL — ABNORMAL LOW (ref 3.5–5.0)
Alkaline Phosphatase: 196 U/L — ABNORMAL HIGH (ref 38–126)
Anion gap: 10 (ref 5–15)
BUN: 71 mg/dL — ABNORMAL HIGH (ref 8–23)
CO2: 20 mmol/L — ABNORMAL LOW (ref 22–32)
Calcium: 8 mg/dL — ABNORMAL LOW (ref 8.9–10.3)
Chloride: 113 mmol/L — ABNORMAL HIGH (ref 98–111)
Creatinine, Ser: 2.61 mg/dL — ABNORMAL HIGH (ref 0.44–1.00)
GFR, Estimated: 18 mL/min — ABNORMAL LOW (ref >60)
Glucose, Bld: 127 mg/dL — ABNORMAL HIGH (ref 70–99)
Potassium: 4.7 mmol/L (ref 3.5–5.1)
Sodium: 143 mmol/L (ref 135–145)
Total Bilirubin: 1.4 mg/dL — ABNORMAL HIGH (ref 0.3–1.2)
Total Protein: 4.7 g/dL — ABNORMAL LOW (ref 6.5–8.1)

## 2020-11-11 LAB — PROCALCITONIN: Procalcitonin: 0.47 ng/mL

## 2020-11-11 LAB — POCT I-STAT 7, (LYTES, BLD GAS, ICA,H+H)
Acid-base deficit: 7 mmol/L — ABNORMAL HIGH (ref 0.0–2.0)
Bicarbonate: 18.4 mmol/L — ABNORMAL LOW (ref 20.0–28.0)
Calcium, Ion: 1.23 mmol/L (ref 1.15–1.40)
HCT: 31 % — ABNORMAL LOW (ref 36.0–46.0)
Hemoglobin: 10.5 g/dL — ABNORMAL LOW (ref 12.0–15.0)
O2 Saturation: 89 %
Patient temperature: 95.8
Potassium: 4.5 mmol/L (ref 3.5–5.1)
Sodium: 144 mmol/L (ref 135–145)
TCO2: 19 mmol/L — ABNORMAL LOW (ref 22–32)
pCO2 arterial: 34.4 mmHg (ref 32.0–48.0)
pH, Arterial: 7.329 — ABNORMAL LOW (ref 7.350–7.450)
pO2, Arterial: 55 mmHg — ABNORMAL LOW (ref 83.0–108.0)

## 2020-11-11 LAB — GLUCOSE, CAPILLARY
Glucose-Capillary: 105 mg/dL — ABNORMAL HIGH (ref 70–99)
Glucose-Capillary: 116 mg/dL — ABNORMAL HIGH (ref 70–99)
Glucose-Capillary: 119 mg/dL — ABNORMAL HIGH (ref 70–99)
Glucose-Capillary: 146 mg/dL — ABNORMAL HIGH (ref 70–99)

## 2020-11-11 LAB — AMMONIA: Ammonia: 35 umol/L (ref 9–35)

## 2020-11-11 LAB — TROPONIN I (HIGH SENSITIVITY): Troponin I (High Sensitivity): 38 ng/L — ABNORMAL HIGH (ref <18)

## 2020-11-11 LAB — LACTIC ACID, PLASMA: Lactic Acid, Venous: 1.2 mmol/L (ref 0.5–1.9)

## 2020-11-11 LAB — CORTISOL: Cortisol, Plasma: 28.1 ug/dL

## 2020-11-11 MED ORDER — ALBUMIN HUMAN 25 % IV SOLN
25.0000 g | Freq: Four times a day (QID) | INTRAVENOUS | Status: AC
  Administered 2020-11-11 – 2020-11-12 (×4): 25 g via INTRAVENOUS
  Filled 2020-11-11 (×6): qty 100

## 2020-11-11 MED ORDER — SODIUM CHLORIDE 0.9 % IV SOLN: INTRAVENOUS | Status: DC

## 2020-11-11 MED ORDER — NOREPINEPHRINE 4 MG/250ML-% IV SOLN
0.0000 ug/min | INTRAVENOUS | Status: DC
  Administered 2020-11-11: 7 ug/min via INTRAVENOUS
  Administered 2020-11-11: 6 ug/min via INTRAVENOUS
  Administered 2020-11-12: 7 ug/min via INTRAVENOUS
  Administered 2020-11-12: 9 ug/min via INTRAVENOUS
  Administered 2020-11-13: 6 ug/min via INTRAVENOUS
  Administered 2020-11-13: 9 ug/min via INTRAVENOUS
  Administered 2020-11-14: 10 ug/min via INTRAVENOUS
  Administered 2020-11-14: 6 ug/min via INTRAVENOUS
  Administered 2020-11-14: 18 ug/min via INTRAVENOUS
  Administered 2020-11-14: 6 ug/min via INTRAVENOUS
  Filled 2020-11-11 (×10): qty 250

## 2020-11-11 MED ORDER — ALBUMIN HUMAN 25 % IV SOLN
12.5000 g | Freq: Once | INTRAVENOUS | Status: AC
  Administered 2020-11-11: 12.5 g via INTRAVENOUS
  Filled 2020-11-11: qty 50

## 2020-11-11 NOTE — Progress Notes (Signed)
[facility] Progress Note Patient Name: Whitney [name] DOB: [birth year] MRN: [id number]   Date of Service  11/11/2020  HPI/Events of Note  Asked to [facility] use of NG tube from 2 pm X ray. Reported by radiology but not clear if contrast given to confirm this again?. Will repeat the KUB and asked RN to make sure overlying leads etc are moved so as to allow clear view.   eICU Interventions  KUB ordered     Intervention Category Minor Interventions: Clinical assessment - ordering diagnostic tests  Margaretmary Lombard 11/11/2020, 9:11 PM

## 2020-11-11 NOTE — Progress Notes (Signed)
Pt has had 30-11ml of output from her foley catheter. Pt has poor oral intake, and severe edema. Paged Dr. Myna Hidalgo. Dr. Myna Hidalgo ordered CBC and CMP for morning labs. Will continue to monitor.  Adella Hare, RN

## 2020-11-11 NOTE — Progress Notes (Signed)
Although patient's white blood cell count, lactic acid and procalcitonin seem to improve after changing cefepime to Zosyn, this morning, renal function worsening with hypotension and hypothermia despite Retail [employer].  She is also become less responsive.  IV fluids started but she still remained hypotensive.  In discussion with daughter who understands that patient is likely dying and efforts to resuscitate her would do little, patient daughter asks for [place] to continue to do everything.  Patient transferred to ICU and started on pressor support.  Critical care taking over attending services.  Greatly appreciative of their help.

## 2020-11-11 NOTE — Progress Notes (Signed)
NAMEJamilia [name], MRN:  [id number], DOB:  February 09, 1935, LOS: 87 ADMISSION DATE:  11/03/2020, CONSULTATION DATE:  10/25/2020 REFERRING MD:  Reather Converse, CHIEF COMPLAINT:  Abdominal pain  Brief History   85yF with septic and hypovolemic shock with likely intraabdominal source  History of present illness   85yF with DM2, cirrhosis, CKD3, chronic R hip fracture, recent admission for emphysematous cystitis and pyelitis due to ESBL klebsiella, last discharged 11/23 from admission for acute appendicitis s/p appendectomy 09/18/20. Her course was complicated by recurrent abdominal pain, found to have imaging suggestive of acute cholecystitis although interpretation challengign in setting of her anasarca - she was not felt at the time to be candidate for c-tube (IR concerned at the time for long term infectious complication in setting of her ascites) or surgery due to frailty. When she was discharged her daughter says that she barely ate or drank anything. This evening she developed sudden onset sharp lower abdominal pain prompting their visit to the ED. She's had no nausea, vomiting, diarrhea, hematochezia/melena, fever.  In ED she was given 2L IVF, meropenem, started on levophed through a peripheral IV  Past Medical History  DM2 Cirrhosis [facility] Events   12/17 Admitted 12/21 s/p perc chole drain  with + cx from this. On abx and will change as sensitivities return. S/p paracentesis  12/23 remains on levo at 43mcg will give 1uprbc for hgb 7.5 this am and follow. Cont midodrine started yesterday. Repeat para done 12/30 para 4.5L, start HCAP coverage  Consults:  Urology General surgery  Procedures:  Paracentesis 12/17 Arterial line 12/17 >> 12/21 IR PERC cholecystectomy and para 12/21  Repeat para 12/23  Significant Diagnostic Tests:  CT A/P 12/17 with rapid accumulation of complex appearing ascites, emphysematous pyelo which may have acute component, bowel wall thickening,  cholelithiasis  Micro Data:  See micro tab  Antimicrobials:  See fever tab  Interim history/subjective:  Called back for worsening AMS and hypotension. Patient moans to stimulation but that's about it.  Objective   Blood pressure (!) 75/31, pulse 60, temperature (!) 95.8 F (35.4 C), temperature source Oral, resp. rate 12, height 5\' 1"  (1.549 m), weight 77.6 kg, SpO2 95 %.        Intake/Output Summary (Last 24 hours) at 11/11/2020 1214 Last data filed at 11/11/2020 0431 Gross per 24 hour  Intake 160 ml  Output 80 ml  Net 80 ml   Filed Weights   11/09/20 0551 11/10/20 0310 11/11/20 0500  Weight: 77.1 kg 79 kg 77.6 kg    Examination: Constitutional: ill appearing frail elderly woman in NAD  Eyes: pupils sluggish but equal Ears, nose, mouth, and throat: MM dry, trachea midline, temporal wasting Cardiovascular: RRR, ext lukewarm Respiratory: shallow inspiratory efforts, scattered crackles, no accessory muscle use Gastrointestinal: Soft, RUQ biliary drain in place Skin: No rashes, normal turgor Neurologic: withdraws x 4, moans to pain, not following commands Psychiatric: RASS -3  Renal failure slowly worsening over stay Lactate 1.9>>2.5 K low No new imaging WBC 16>>13  Resolved Hospital Problem list   Lactic acidosis  Assessment & Plan:   Septic Shock, recurrent Cholecystitis, klebsiella peritonitis HCAP Klebsiella infection -HIDA scan with acute cholecystitis. IR placement of percutaneous cholecystostomy 12/21.  P: - Continue vanc, zosyn - Blood/sputum/urine cultures - Cortrak/midodrine/albumin  Ascites -Due to decompensated cirrhosis , peritonitis, note fluid creatinine 2.0 (same as serum creatinine) question urinarperitoneal communication P: - Continue antibiotics as above - Urology consulted with no acute GU interventions recommended  Acute metabolic encephalopathy- likely multifactorial - Check ABG, ammonia - High risk for progressing to needing  intubation depending on GOC  AKI on CKD.  P: - Follow renal function and urine output -Trend BMET - Avoid nephrotoxins - Ensure adequate renal perfusion - Renally dose medications  Goals of care:  Will discuss with daughter who is on her way in; patient has had a rough course and high risk for further deteriorating and in hospital death  Best practice:  Diet:NPO Pain/Anxiety/Delirium protocol (if indicated): hold VAP protocol (if indicated): no DVT prophylaxis: heparin GI prophylaxis: not indicated Glucose control: SSI Mobility: BR Code Status: Full Family Communication: pending Disposition: transfer to ICU  Signature:    Patient critically ill due to septic shock Interventions to address this today pressor titration, family discussions Risk of deterioration without these interventions is high  I personally spent 45 minutes providing critical care not including any separately billable procedures  Erskine Emery MD [facility] 11/11/2020 12:28 PM Personal pager: [phone] If unanswered, please page CCM On-call: [phone]

## 2020-11-11 NOTE — Progress Notes (Signed)
Pts BP 84/39 (54), rechecked and BP 103/35 (51).  HR 65-70. SpO2 95% room air. RR 15-20. Rectal Temp 94.7F. Pt currently has bear hugger warming blanket. Paged MD and no new orders. Md stated to continue monitoring BP closely and notify of any major changes. MD also stated to continue warming blanket. Will continue to monitor. Adella Hare, RN

## 2020-11-11 NOTE — Progress Notes (Signed)
Patient perked up with levophed. Daughter unwilling to change goals of care at this time. Would use cortrak vs ngt and start tf/midodrine.

## 2020-11-11 NOTE — Progress Notes (Addendum)
Pt continues to deteriorate.  Current vitals:Temp. 95.8 axillary, BP 76/33 (46), radial pulse is thready HR: 40's-60's with occasional pauses.  Pt has become responsive to tactile sensation only and pupil response is very sluggish. Very little output over night and today.  Gevena Barre MD paged X 2.  Charge and Rapid response called and both currently in room.

## 2020-11-11 DEATH — deceased

## 2020-11-12 DIAGNOSIS — J189 Pneumonia, unspecified organism: Secondary | ICD-10-CM | POA: Diagnosis not present

## 2020-11-12 LAB — COMPREHENSIVE METABOLIC PANEL
ALT: 8 U/L (ref 0–44)
AST: 21 U/L (ref 15–41)
Albumin: 3 g/dL — ABNORMAL LOW (ref 3.5–5.0)
Alkaline Phosphatase: 144 U/L — ABNORMAL HIGH (ref 38–126)
Anion gap: 15 (ref 5–15)
BUN: 71 mg/dL — ABNORMAL HIGH (ref 8–23)
CO2: 18 mmol/L — ABNORMAL LOW (ref 22–32)
Calcium: 8.3 mg/dL — ABNORMAL LOW (ref 8.9–10.3)
Chloride: 112 mmol/L — ABNORMAL HIGH (ref 98–111)
Creatinine, Ser: 2.74 mg/dL — ABNORMAL HIGH (ref 0.44–1.00)
GFR, Estimated: 17 mL/min — ABNORMAL LOW (ref >60)
Glucose, Bld: 169 mg/dL — ABNORMAL HIGH (ref 70–99)
Potassium: 4.4 mmol/L (ref 3.5–5.1)
Sodium: 145 mmol/L (ref 135–145)
Total Bilirubin: 2.1 mg/dL — ABNORMAL HIGH (ref 0.3–1.2)
Total Protein: 5.2 g/dL — ABNORMAL LOW (ref 6.5–8.1)

## 2020-11-12 LAB — CBC
HCT: 23.8 % — ABNORMAL LOW (ref 36.0–46.0)
Hemoglobin: 7.4 g/dL — ABNORMAL LOW (ref 12.0–15.0)
MCH: 28.8 pg (ref 26.0–34.0)
MCHC: 31.1 g/dL (ref 30.0–36.0)
MCV: 92.6 fL (ref 80.0–100.0)
Platelets: 58 10*3/uL — ABNORMAL LOW (ref 150–400)
RBC: 2.57 MIL/uL — ABNORMAL LOW (ref 3.87–5.11)
RDW: 20.5 % — ABNORMAL HIGH (ref 11.5–15.5)
WBC: 11 10*3/uL — ABNORMAL HIGH (ref 4.0–10.5)
nRBC: 0.5 % — ABNORMAL HIGH (ref 0.0–0.2)

## 2020-11-12 LAB — PHOSPHORUS
Phosphorus: 3.2 mg/dL (ref 2.5–4.6)
Phosphorus: 3 mg/dL (ref 2.5–4.6)
Phosphorus: 2.9 mg/dL (ref 2.5–4.6)

## 2020-11-12 LAB — MAGNESIUM
Magnesium: 1.8 mg/dL (ref 1.7–2.4)
Magnesium: 1.8 mg/dL (ref 1.7–2.4)
Magnesium: 1.7 mg/dL (ref 1.7–2.4)

## 2020-11-12 LAB — GLUCOSE, CAPILLARY
Glucose-Capillary: 168 mg/dL — ABNORMAL HIGH (ref 70–99)
Glucose-Capillary: 169 mg/dL — ABNORMAL HIGH (ref 70–99)
Glucose-Capillary: 172 mg/dL — ABNORMAL HIGH (ref 70–99)
Glucose-Capillary: 192 mg/dL — ABNORMAL HIGH (ref 70–99)
Glucose-Capillary: 196 mg/dL — ABNORMAL HIGH (ref 70–99)
Glucose-Capillary: 222 mg/dL — ABNORMAL HIGH (ref 70–99)

## 2020-11-12 LAB — PROCALCITONIN: Procalcitonin: 0.51 ng/mL

## 2020-11-12 MED ORDER — LACTATED RINGERS IV SOLN: INTRAVENOUS | Status: DC

## 2020-11-12 MED ORDER — PROSOURCE TF PO LIQD
45.0000 mL | Freq: Two times a day (BID) | ORAL | Status: DC
  Administered 2020-11-12 – 2020-11-13 (×3): 45 mL
  Filled 2020-11-12 (×3): qty 45

## 2020-11-12 MED ORDER — VITAL HIGH PROTEIN PO LIQD
1000.0000 mL | ORAL | Status: DC
  Administered 2020-11-12: 1000 mL

## 2020-11-12 MED ORDER — SODIUM CHLORIDE 0.9 % IV BOLUS
500.0000 mL | Freq: Once | INTRAVENOUS | Status: AC
  Administered 2020-11-12: 500 mL via INTRAVENOUS

## 2020-11-12 NOTE — Progress Notes (Signed)
NAMEChamika [name], MRN:  [id number], DOB:  1935-08-31, LOS: 59 ADMISSION DATE:  11/05/2020, CONSULTATION DATE:  10/11/2020 REFERRING MD:  Reather Converse, CHIEF COMPLAINT:  Abdominal pain  Brief History   85yF with septic and hypovolemic shock with likely intraabdominal source  History of present illness   85yF with DM2, cirrhosis, CKD3, chronic R hip fracture, recent admission for emphysematous cystitis and pyelitis due to ESBL klebsiella, last discharged 11/23 from admission for acute appendicitis s/p appendectomy 09/18/20. Her course was complicated by recurrent abdominal pain, found to have imaging suggestive of acute cholecystitis although interpretation challengign in setting of her anasarca - she was not felt at the time to be candidate for c-tube (IR concerned at the time for long term infectious complication in setting of her ascites) or surgery due to frailty. When she was discharged her daughter says that she barely ate or drank anything. This evening she developed sudden onset sharp lower abdominal pain prompting their visit to the ED. She's had no nausea, vomiting, diarrhea, hematochezia/melena, fever.  In ED she was given 2L IVF, meropenem, started on levophed through a peripheral IV  Past Medical History  DM2 Cirrhosis [facility] Events   12/17 Admitted 12/21 s/p perc chole drain  with + cx from this. On abx and will change as sensitivities return. S/p paracentesis  12/23 remains on levo at 21mcg will give 1uprbc for hgb 7.5 this am and follow. Cont midodrine started yesterday. Repeat para done 12/30 para 4.5L, start HCAP coverage  Consults:  Urology General surgery  Procedures:  Paracentesis 12/17 Arterial line 12/17 >> 12/21 IR PERC cholecystectomy and para 12/21  Repeat para 12/23  Significant Diagnostic Tests:  CT A/P 12/17 with rapid accumulation of complex appearing ascites, emphysematous pyelo which may have acute component, bowel wall thickening,  cholelithiasis  Micro Data:  See micro tab  Antimicrobials:  See fever tab  Interim history/subjective:  Status remains unchanged.  Still minimally response.  No increased vasopressor requirement  Objective   Blood pressure (!) 121/39, pulse 76, temperature 97.9 F (36.6 C), temperature source Axillary, resp. rate 13, height 5\' 1"  (1.549 m), weight 77.6 kg, SpO2 100 %.        Intake/Output Summary (Last 24 hours) at 11/12/2020 1319 Last data filed at 11/12/2020 1200 Gross per 24 hour  Intake 3847.2 ml  Output 221 ml  Net 3626.2 ml   Filed Weights   11/09/20 0551 11/10/20 0310 11/11/20 0500  Weight: 77.1 kg 79 kg 77.6 kg    Examination: Constitutional: ill appearing frail elderly woman in NAD.  Morbidly obese but significant loss of muscle mass. Eyes: pupils sluggish but equal Ears, nose, mouth, and throat: MM dry, trachea midline, temporal wasting Cardiovascular: RRR, ext lukewarm Respiratory: shallow inspiratory efforts, scattered crackles, no accessory muscle use Gastrointestinal: Soft, RUQ biliary drain in place Skin: No rashes, normal turgor Neurologic: withdraws x 4, moans to pain, not following commands Psychiatric: RASS -3   Resolved Hospital Problem list   Lactic acidosis  Assessment & Plan:   Septic Shock, recurrent Cholecystitis, klebsiella peritonitis HCAP Klebsiella infection -HIDA scan with acute cholecystitis. IR placement of percutaneous cholecystostomy 12/21.  P: - Continue vanc, zosyn - Blood/sputum/urine cultures - Cortrak/midodrine/albumin  Ascites -Due to decompensated cirrhosis , peritonitis, note fluid creatinine 2.0 (same as serum creatinine) question urinarperitoneal communication P: - Continue antibiotics as above - Urology consulted with no acute GU interventions recommended  Acute metabolic encephalopathy- likely multifactorial - Check ABG, ammonia -  High risk for progressing to needing intubation depending on GOC  AKI on CKD.   Continues to worsen slow P: - Follow renal function and urine output -Trend BMET - Avoid nephrotoxins - Ensure adequate renal perfusion - Renally dose medications  Severe protein calorie malnutrition -Initiate tube feed  Best practice:  Diet:NPO PEPUP Pain/Anxiety/Delirium protocol (if indicated): hold VAP protocol (if indicated): no DVT prophylaxis: heparin GI prophylaxis: not indicated Glucose control: SSI Mobility: BR Code Status: Full Family Communication: pending Disposition: transfer to ICU  CRITICAL CARE Performed by: Kipp Brood   Total critical care time: 35 minutes  Critical care time was exclusive of separately billable procedures and treating other patients.  Critical care was necessary to treat or prevent imminent or life-threatening deterioration.  Critical care was time spent personally by me on the following activities: development of treatment plan with patient and/or surrogate as well as nursing, discussions with consultants, evaluation of patient's response to treatment, examination of patient, obtaining history from patient or surrogate, ordering and performing treatments and interventions, ordering and review of laboratory studies, ordering and review of radiographic studies, pulse oximetry, re-evaluation of patient's condition and participation in multidisciplinary rounds.  Kipp Brood, MD [facility] ICU Physician [place]  Pager: [phone] Mobile: [phone] After hours: [phone].

## 2020-11-12 NOTE — Plan of Care (Signed)
  Problem: Education: Goal: Knowledge of General Education information will improve Description: Including pain rating scale, medication(s)/side effects and non-pharmacologic comfort measures Outcome: Not Progressing   Problem: Health Behavior/Discharge Planning: Goal: Ability to manage health-related needs will improve Outcome: Not Progressing   Problem: Clinical Measurements: Goal: Ability to maintain clinical measurements within normal limits will improve Outcome: Not Progressing Goal: Will remain free from infection Outcome: Not Progressing Goal: Diagnostic test results will improve Outcome: Not Progressing Goal: Respiratory complications will improve Outcome: Progressing Goal: Cardiovascular complication will be avoided Outcome: Progressing   Problem: Activity: Goal: Risk for activity intolerance will decrease Outcome: Not Progressing   Problem: Nutrition: Goal: Adequate nutrition will be maintained Outcome: Not Progressing   Problem: Coping: Goal: Level of anxiety will decrease Outcome: Progressing   Problem: Elimination: Goal: Will not experience complications related to bowel motility Outcome: Progressing Goal: Will not experience complications related to urinary retention Outcome: Progressing   Problem: Pain Managment: Goal: General experience of comfort will improve Outcome: Progressing   Problem: Safety: Goal: Ability to remain free from injury will improve Outcome: Progressing   Problem: Skin Integrity: Goal: Risk for impaired skin integrity will decrease Outcome: Not Progressing

## 2020-11-13 DIAGNOSIS — N179 Acute kidney failure, unspecified: Secondary | ICD-10-CM | POA: Diagnosis not present

## 2020-11-13 DIAGNOSIS — K810 Acute cholecystitis: Secondary | ICD-10-CM | POA: Diagnosis not present

## 2020-11-13 DIAGNOSIS — G9341 Metabolic encephalopathy: Secondary | ICD-10-CM

## 2020-11-13 DIAGNOSIS — A419 Sepsis, unspecified organism: Secondary | ICD-10-CM | POA: Diagnosis not present

## 2020-11-13 DIAGNOSIS — R601 Generalized edema: Secondary | ICD-10-CM

## 2020-11-13 LAB — FIBRINOGEN: Fibrinogen: 149 mg/dL — ABNORMAL LOW (ref 210–475)

## 2020-11-13 LAB — MAGNESIUM
Magnesium: 1.8 mg/dL (ref 1.7–2.4)
Magnesium: 2 mg/dL (ref 1.7–2.4)

## 2020-11-13 LAB — PHOSPHORUS
Phosphorus: 2.8 mg/dL (ref 2.5–4.6)
Phosphorus: 2.1 mg/dL — ABNORMAL LOW (ref 2.5–4.6)

## 2020-11-13 LAB — D-DIMER, QUANTITATIVE: D-Dimer, Quant: 6.6 ug/mL-FEU — ABNORMAL HIGH (ref 0.00–0.50)

## 2020-11-13 LAB — COMPREHENSIVE METABOLIC PANEL
ALT: 8 U/L (ref 0–44)
AST: 18 U/L (ref 15–41)
Albumin: 2.4 g/dL — ABNORMAL LOW (ref 3.5–5.0)
Alkaline Phosphatase: 141 U/L — ABNORMAL HIGH (ref 38–126)
Anion gap: 16 — ABNORMAL HIGH (ref 5–15)
BUN: 70 mg/dL — ABNORMAL HIGH (ref 8–23)
CO2: 16 mmol/L — ABNORMAL LOW (ref 22–32)
Calcium: 8 mg/dL — ABNORMAL LOW (ref 8.9–10.3)
Chloride: 104 mmol/L (ref 98–111)
Creatinine, Ser: 2.87 mg/dL — ABNORMAL HIGH (ref 0.44–1.00)
GFR, Estimated: 16 mL/min — ABNORMAL LOW (ref >60)
Glucose, Bld: 389 mg/dL — ABNORMAL HIGH (ref 70–99)
Potassium: 3.6 mmol/L (ref 3.5–5.1)
Sodium: 136 mmol/L (ref 135–145)
Total Bilirubin: 2 mg/dL — ABNORMAL HIGH (ref 0.3–1.2)
Total Protein: 4.8 g/dL — ABNORMAL LOW (ref 6.5–8.1)

## 2020-11-13 LAB — GLUCOSE, CAPILLARY
Glucose-Capillary: 213 mg/dL — ABNORMAL HIGH (ref 70–99)
Glucose-Capillary: 215 mg/dL — ABNORMAL HIGH (ref 70–99)
Glucose-Capillary: 237 mg/dL — ABNORMAL HIGH (ref 70–99)
Glucose-Capillary: 255 mg/dL — ABNORMAL HIGH (ref 70–99)
Glucose-Capillary: 264 mg/dL — ABNORMAL HIGH (ref 70–99)

## 2020-11-13 LAB — PROTIME-INR
INR: 3.3 — ABNORMAL HIGH (ref 0.8–1.2)
Prothrombin Time: 32.3 seconds — ABNORMAL HIGH (ref 11.4–15.2)

## 2020-11-13 LAB — CBC
HCT: 23.5 % — ABNORMAL LOW (ref 36.0–46.0)
Hemoglobin: 7.9 g/dL — ABNORMAL LOW (ref 12.0–15.0)
MCH: 30.2 pg (ref 26.0–34.0)
MCHC: 33.6 g/dL (ref 30.0–36.0)
MCV: 89.7 fL (ref 80.0–100.0)
Platelets: 49 10*3/uL — ABNORMAL LOW (ref 150–400)
RBC: 2.62 MIL/uL — ABNORMAL LOW (ref 3.87–5.11)
RDW: 21.2 % — ABNORMAL HIGH (ref 11.5–15.5)
WBC: 12 10*3/uL — ABNORMAL HIGH (ref 4.0–10.5)
nRBC: 0.3 % — ABNORMAL HIGH (ref 0.0–0.2)

## 2020-11-13 LAB — LACTATE DEHYDROGENASE: LDH: 138 U/L (ref 98–192)

## 2020-11-13 LAB — TECHNOLOGIST SMEAR REVIEW: Plt Morphology: DECREASED

## 2020-11-13 LAB — PROCALCITONIN: Procalcitonin: 0.53 ng/mL

## 2020-11-13 MED ORDER — ALBUMIN HUMAN 5 % IV SOLN: 12.5000 g | Freq: Once | INTRAVENOUS | Status: AC

## 2020-11-13 MED ORDER — STERILE WATER FOR INJECTION IV SOLN
INTRAVENOUS | Status: DC
  Filled 2020-11-13 (×10): qty 850

## 2020-11-13 MED ORDER — POTASSIUM & SODIUM PHOSPHATES 280-160-250 MG PO PACK
1.0000 | PACK | Freq: Once | ORAL | Status: AC
  Administered 2020-11-13: 1
  Filled 2020-11-13: qty 1

## 2020-11-13 MED ORDER — ALBUMIN HUMAN 5 % IV SOLN
INTRAVENOUS | Status: AC
  Administered 2020-11-13: 12.5 g via INTRAVENOUS
  Filled 2020-11-13: qty 250

## 2020-11-13 MED ORDER — INSULIN ASPART 100 UNIT/ML ~~LOC~~ SOLN
0.0000 [IU] | SUBCUTANEOUS | Status: DC
  Administered 2020-11-13: 5 [IU] via SUBCUTANEOUS
  Administered 2020-11-13 (×2): 3 [IU] via SUBCUTANEOUS
  Administered 2020-11-13 (×2): 5 [IU] via SUBCUTANEOUS
  Administered 2020-11-14: 3 [IU] via SUBCUTANEOUS
  Administered 2020-11-14: 2 [IU] via SUBCUTANEOUS
  Administered 2020-11-14: 5 [IU] via SUBCUTANEOUS
  Administered 2020-11-14: 2 [IU] via SUBCUTANEOUS
  Administered 2020-11-14 (×2): 3 [IU] via SUBCUTANEOUS
  Administered 2020-11-14: 2 [IU] via SUBCUTANEOUS
  Administered 2020-11-15: 1 [IU] via SUBCUTANEOUS

## 2020-11-13 MED ORDER — PIPERACILLIN-TAZOBACTAM IN DEX 2-0.25 GM/50ML IV SOLN
2.2500 g | Freq: Four times a day (QID) | INTRAVENOUS | Status: DC
  Administered 2020-11-13 – 2020-11-15 (×11): 2.25 g via INTRAVENOUS
  Filled 2020-11-13 (×13): qty 50

## 2020-11-13 MED ORDER — POTASSIUM CHLORIDE 20 MEQ PO PACK
40.0000 meq | PACK | Freq: Once | ORAL | Status: AC
  Administered 2020-11-13: 40 meq
  Filled 2020-11-13: qty 2

## 2020-11-13 MED ORDER — CHLORHEXIDINE GLUCONATE 0.12 % MT SOLN
15.0000 mL | Freq: Two times a day (BID) | OROMUCOSAL | Status: DC
  Administered 2020-11-14 – 2020-11-15 (×5): 15 mL via OROMUCOSAL
  Filled 2020-11-13 (×5): qty 15

## 2020-11-13 MED ORDER — MAGNESIUM SULFATE 2 GM/50ML IV SOLN
2.0000 g | Freq: Once | INTRAVENOUS | Status: AC
  Administered 2020-11-13: 2 g via INTRAVENOUS
  Filled 2020-11-13: qty 50

## 2020-11-13 MED ORDER — VITAL AF 1.2 CAL PO LIQD
1000.0000 mL | ORAL | Status: DC
  Administered 2020-11-13 – 2020-11-15 (×2): 1000 mL

## 2020-11-13 MED ORDER — ORAL CARE MOUTH RINSE
15.0000 mL | Freq: Two times a day (BID) | OROMUCOSAL | Status: DC
  Administered 2020-11-14 – 2020-11-15 (×4): 15 mL via OROMUCOSAL

## 2020-11-13 MED ORDER — INSULIN GLARGINE 100 UNIT/ML ~~LOC~~ SOLN
10.0000 [IU] | Freq: Every day | SUBCUTANEOUS | Status: DC
  Administered 2020-11-13 – 2020-11-15 (×3): 10 [IU] via SUBCUTANEOUS
  Filled 2020-11-13 (×4): qty 0.1

## 2020-11-13 NOTE — Progress Notes (Signed)
NAMEParneet [name], MRN:  [id number], DOB:  1935-03-08, LOS: 75 ADMISSION DATE:  10/17/2020, CONSULTATION DATE:  10/23/2020 REFERRING MD:  Whitney [name], CHIEF COMPLAINT:  Abdominal pain  Brief History   85yF with septic and hypovolemic shock with likely intraabdominal source  History of present illness   85yF with DM2, cirrhosis, CKD3, chronic R hip fracture, recent admission for emphysematous cystitis and pyelitis due to ESBL klebsiella, last discharged 11/23 from admission for acute appendicitis s/p appendectomy 09/18/20. Her course was complicated by recurrent abdominal pain, found to have imaging suggestive of acute cholecystitis although interpretation challengign in setting of her anasarca - she was not felt at the time to be candidate for c-tube (IR concerned at the time for long term infectious complication in setting of her ascites) or surgery due to frailty. When she was discharged her daughter says that she barely ate or drank anything. This evening she developed sudden onset sharp lower abdominal pain prompting their visit to the ED. She's had no nausea, vomiting, diarrhea, hematochezia/melena, fever.  In ED she was given 2L IVF, meropenem, started on levophed through a peripheral IV  Past Medical History  DM2 Cirrhosis [facility] Events   12/17 Admitted 12/21 s/p perc chole drain  with + cx from this. On abx and will change as sensitivities return. S/p paracentesis  12/23 remains on levo at 21mcg will give 1uprbc for hgb 7.5 this am and follow. Cont midodrine started yesterday. Repeat para done 12/30 para 4.5L, start HCAP coverage  Consults:  Urology General surgery  Procedures:  Paracentesis 12/17 Arterial line 12/17 >> 12/21 IR PERC cholecystectomy and para 12/21  Repeat para 12/23  Significant Diagnostic Tests:  CT A/P 12/17 with rapid accumulation of complex appearing ascites, emphysematous pyelo which may have acute component, bowel wall thickening,  cholelithiasis  Micro Data:  See micro tab  Antimicrobials:  See fever tab  Interim history/subjective:  Patient remains minimally responsive, laying in bed.  Objective   Blood pressure (!) 138/59, pulse (!) 109, temperature 98.4 F (36.9 C), temperature source Oral, resp. rate 17, height 5\' 1"  (1.549 m), weight 77.6 kg, SpO2 100 %.        Intake/Output Summary (Last 24 hours) at 11/13/2020 0803 Last data filed at 11/13/2020 0600 Gross per 24 hour  Intake 2845.57 ml  Output 153 ml  Net 2692.57 ml   Filed Weights   11/09/20 0551 11/10/20 0310 11/11/20 0500  Weight: 77.1 kg 79 kg 77.6 kg    Examination: Constitutional: Chronically ill appearing elderly woman in NAD.  Morbidly obese but significant loss of muscle mass. Eyes: pupils sluggish but equal Ears, nose, mouth, and throat: MM dry, trachea midline, temporal wasting Cardiovascular: RRR, diffuse 3+ BL LE swelling Respiratory: shallow inspiratory efforts, scattered crackles, no accessory muscle use Gastrointestinal: Soft, RUQ biliary drain in place Skin: No rashes, normal turgor Neurologic: withdraws x 4, not following commands Psychiatric: RASS -3   Resolved Hospital Problem list   Lactic acidosis  Assessment & Plan:   Septic Shock, recurrent Cholecystitis, klebsiella peritonitis HCAP Klebsiella infection -HIDA scan with acute cholecystitis. IR placement of percutaneous cholecystostomy 12/21.  -Repeat blood cultures 1/1 showed NGTD x24 hours  P: - Continue zosyn (12/31 > ) - Blood/sputum/urine cultures - Cortrak/midodrine/albumin - Remains on Levophed, wean as tolerated  Anemia:  Hgb went from  7.4 > 7.9 today, Hgb earlier during admission around 10-11. Appears to have decreased around time of paracentesis. Could be 2/2 hematoma vs DIC.   -  Checking DIC labs -Consider CT abd if labs unremarkable.  Ascites -Due to decompensated cirrhosis , peritonitis, note fluid creatinine 2.0 (same as serum creatinine)  question urinarperitoneal communication  P: - Continue antibiotics as above - Urology consulted with no acute GU interventions recommended  Acute metabolic encephalopathy- likely multifactorial - Ammonia WNL 2 days ago - High risk for progressing to needing intubation depending on [address]  AKI on CKD.   Continues to worsen. Cr 2.87 today from BL of around 1.2-1.7. Minimal urine output noted. Bicab 16.   P: - Follow renal function and urine output - Start Bicarb drip -Trend BMET - Avoid nephrotoxins - Ensure adequate renal perfusion - Renally dose medications  Severe protein calorie malnutrition -Initiate tube feed  Best practice:  Diet: NPO PEPUP Pain/Anxiety/Delirium protocol (if indicated): hold VAP protocol (if indicated): no DVT prophylaxis: heparin GI prophylaxis: not indicated Glucose control: SSI Mobility: BR Code Status: Full Family Communication: pending Disposition: Remain in ICU  CRITICAL CARE Performed by: Asencion Noble

## 2020-11-13 NOTE — Progress Notes (Signed)
Pharmacy Antibiotic Note  Whitney [name] is a 86 y.o. female admitted on 11/10/2020 with HCAP / septic gall bladder. Change in antibiotic regimen today 12/31 to Zosyn.    WBC 12, afebrile, Scr continuing to increase at 2.87, PCT 0.52. Day #4 on zosyn.   CTX/Metronidazole 12/23 >>12/30 Meropenem 12/17 >> 12/23 Vancomycin 12/18 x1 Vanc 12/30>> 12/31 Cefepime 12/30>> 12/31  Zosyn 12/31>  Plan: Zosyn 2.25 grams iv Q 6 hours Continue to follow  Height: 5\' 1"  (154.9 cm) Weight:  (bed unable) IBW/kg (Calculated) : 47.8  Temp (24hrs), Avg:98.1 F (36.7 C), Min:97.5 F (36.4 C), Max:98.5 F (36.9 C)  Recent Labs  Lab 11/08/20 0127 11/08/20 0601 11/09/20 0118 11/10/20 0556 11/11/20 0627 11/11/20 0845 11/12/20 0355 11/13/20 0428  WBC 13.9*  --  13.5* 15.7* 12.9*  --  11.0* 12.0*  CREATININE 2.36*  --  2.34* 2.36* 2.61*  --  2.74* 2.87*  LATICACIDVEN 2.1* 1.9 2.5* 1.6  --  1.2  --   --     Estimated Creatinine Clearance: 14 mL/min (A) (by C-G formula based on SCr of 2.87 mg/dL (H)).    Allergies  Allergen Reactions  . Succinylcholine Other (See Comments)    No formal diagnosis of pseudocholinesterase deficiency; however, prolonged paralysis during anesthetics on 09/18/20 and 08/18/20.   . Milk-Related Compounds Diarrhea    Thank you  Antonietta Jewel, PharmD, [place] Clinical Pharmacist  Phone: [phone] 11/13/2020 9:39 AM  Please check AMION for all [place] phone numbers After 10:00 PM, call [place] [phone]

## 2020-11-13 NOTE — Procedures (Signed)
Cortrak  Person Inserting Tube:  Lylah Lantis, RD Tube Type:  Cortrak - 43 inches Tube Location:  Right nare Initial Placement:  Stomach Secured by: Bridle Cortrak Secured At:  75 cm     No x-ray is required. RN may begin using tube.   If the tube becomes dislodged please keep the tube and contact the Cortrak team at [email or web address] ([email or web address]) for replacement.  If after hours and replacement cannot be delayed, place a NG tube and confirm placement with an abdominal x-ray.    Mariana Single RD, LDN Clinical Nutrition Pager listed in [place]

## 2020-11-13 NOTE — Progress Notes (Signed)
Contacted [facility] to ensure awareness of minimal urine output that has continued from day shift.

## 2020-11-13 NOTE — Progress Notes (Signed)
Referring Physician(s): Dr. Lucia Gaskins  Supervising Physician: Ruthann Cancer  Patient Status:  [facility] - In-pt  Chief Complaint:  Acute cholecystitis; S/p percutaneous cholecystostomy drain placement 10/31/20 by  Dr. Serafina Royals   Subjective:  Patient now in ICU due to hypothermia, hypotension due to shock.  Now on bear hugger warming blanket and pressors.  Not alert.  Work-up for shock source underway.   Allergies: Succinylcholine and Milk-related compounds  Medications: Prior to Admission medications   Medication Sig Start Date End Date Taking? Authorizing Provider  acetaminophen (TYLENOL) 500 MG tablet Take 1,000 mg by mouth every 8 (eight) hours as needed for moderate pain or mild pain. 02/28/20  Yes [provider]  calcitonin, salmon, (MIACALCIN/FORTICAL) 200 UNIT/ACT nasal spray Place 1 spray into alternate nostrils daily. 03/30/20  Yes Vann, Jessica U, DO  furosemide (LASIX) 20 MG tablet Take 20 mg by mouth daily. 10/13/20  Yes [provider]  hydrocortisone cream 1 % Apply topically 2 (two) times daily. Patient taking differently: Apply 1 application topically 2 (two) times daily. 10/03/20  Yes Cato Mulligan, MD  ibuprofen (ADVIL) 200 MG tablet Take 200-400 mg by mouth daily as needed for headache (pain).   Yes [provider]  insulin glargine (LANTUS) 100 UNIT/ML Solostar Pen Inject 5 Units into the skin at bedtime. Patient taking differently: Inject 6 Units into the skin at bedtime. 04/12/20  Yes Argentina Donovan, PA-C  Multiple Vitamin (MULTIVITAMIN WITH MINERALS) TABS tablet Take 1 tablet by mouth daily.   Yes [provider]  Nutritional Supplements (,FEEDING SUPPLEMENT, PROSOURCE PLUS) liquid Take 30 mLs by mouth 2 (two) times daily between meals. 10/03/20  Yes Cato Mulligan, MD  oxyCODONE-acetaminophen (PERCOCET) 5-325 MG tablet Take 1-2 tablets by mouth every 8 (eight) hours as needed for severe pain. Patient taking differently: Take 1  tablet by mouth every 8 (eight) hours as needed for severe pain. 03/05/20  Yes Leandrew Koyanagi, MD  Vitamin D, Ergocalciferol, (DRISDOL) 1.25 MG (50000 UNIT) CAPS capsule Take 1 capsule (50,000 Units total) by mouth every 7 (seven) days. Patient taking differently: Take 50,000 Units by mouth every Wednesday. 03/30/20  Yes Vann, Jessica U, DO  escitalopram (LEXAPRO) 10 MG tablet Take 10 mg by mouth daily. 10/20/20   [provider]  famotidine (PEPCID) 20 MG tablet Take 1 tablet (20 mg total) by mouth 2 (two) times daily. Patient not taking: No sig reported 03/30/20   Geradine Girt, DO  Insulin Pen Needle (PEN NEEDLES) 30G X 5 MM MISC Use as instructed. 04/26/20   Argentina Donovan, PA-C  tamsulosin (FLOMAX) 0.4 MG CAPS capsule Take 1 capsule (0.4 mg total) by mouth daily. Patient not taking: No sig reported 09/01/20   Virl Axe, MD     Vital Signs: BP (!) 100/54 (BP Location: Right Arm)   Pulse 89   Temp 98 F (36.7 C) (Oral)   Resp 20   Ht 5\' 1"  (1.549 m)   Wt 168 lb (76.2 kg)   SpO2 95%   BMI 31.74 kg/m   Physical Exam Cardiovascular:     Rate and Rhythm: Normal rate and regular rhythm.     Comments: On the monitor Pulmonary:     Effort: Pulmonary effort is normal.  Abdominal:     Comments: RUQ drain to gravity bag. No leakage noted at time of visit. Bilious fluid in bag.  Skin:    General: Skin is warm and dry.     Imaging: No  results found.  Labs:  CBC: Recent Labs    11/04/20 0117 11/05/20 0256 11/06/20 0141 11/07/20 0404  WBC 11.4* 8.0 13.3* 15.0*  HGB 10.2* 11.1* 11.4* 11.8*  HCT 31.1* 32.0* 34.2* 35.0*  PLT 70* PLATELET CLUMPS NOTED ON SMEAR, UNABLE TO ESTIMATE 94* 92*    COAGS: Recent Labs    10/30/2020 1814 11/01/20 0410 11/02/20 0200 11/05/20 0256  INR 1.5* 1.4* 1.9* 1.7*  APTT 32  --   --   --     BMP: Recent Labs    03/19/20 0508 03/23/20 1137 03/26/20 0704 05/27/20 1033 08/17/20 1020 11/04/20 0117 11/05/20 0256  11/06/20 0141 11/07/20 0404  NA  --  143  --  138   < > 144 143 144 142  K  --  4.6  --  4.2   < > 3.2* 3.3* 2.7* 3.2*  CL  --  114*  --  107   < > 110 111 113* 110  CO2  --  23  --  25   < > 21* 20* 20* 20*  GLUCOSE  --  139*  --  269*   < > 213* 246* 290* 232*  BUN  --  26*  --  37*   < > 51* 57* 63* 67*  CALCIUM  --  7.3*  --  8.3*   < > 7.6* 7.9* 7.8* 7.9*  CREATININE 1.47* 1.34* 1.28* 1.35*   < > 2.04* 2.12* 2.24* 2.33*  GFRNONAA 33* 37* 39* 36*   < > 24* 23* 21* 20*  GFRAA 38* 43* 45* 42*  --   --   --   --   --    < > = values in this interval not displayed.    LIVER FUNCTION TESTS: Recent Labs    11/02/20 0200 11/05/20 0256 11/06/20 0141 11/07/20 0404  BILITOT 1.0 0.8 0.4 0.5  AST 18 15 15 17   ALT 11 8 7 10   ALKPHOS 56 67 69 74  PROT 4.8* 5.5* 5.0* 5.2*  ALBUMIN 2.1* 1.7* 1.7* 1.7*    Assessment and Plan: Acute cholecystitis s/p percutaneous cholecystostomy drain placement 10/31/20:  Now in ICU on pressors and warming blanket.  WBC 11.4 Tbili 2.0, platelets down to 49. Now on Zosyn.  Drain in place with ongoing bilious output, 80 mL yesterday.  Drain flushes easily. Debris presents, however no evidence tubing is clogged or displaced. Appears to be functioning well.  Concern per CCM of possible hematoma/bleed after paracentesis 12/30.  Physical exam reveals no palpable hematoma within the abdominal wall.    Continue current drain care for now.  Trend labs; note DIC panel pending.   Other plans per primary teams. IR will continue to follow.  Electronically Signed: Brynda Greathouse, MS RD PA-C 11/13/2020, 10:35 AM   I spent a total of 15 Minutes at the the patient's bedside AND on the patient's hospital floor or unit, greater than 50% of which was counseling/coordinating care for acute cholecystitis.

## 2020-11-13 NOTE — Progress Notes (Signed)
Nutrition Follow-up  DOCUMENTATION CODES:   Not applicable  INTERVENTION:   Tube Feeding via Cortrak:  Vital AF 1.2 at 60 ml/hr Provides 108 g of protein, 1728 kcals and 1166 mL of free water Meets 100% estimated calorie and protein needs TF formula suitable for those with lactose intolerance  Recommend  Considering addition of TF q 4 hour insulin coverage. Defer to DM coordinator   NUTRITION DIAGNOSIS:   Inadequate oral intake related to poor appetite as evidenced by meal completion < 50%.  Being addressed via TF   GOAL:   Patient will meet greater than or equal to 90% of their needs  Progressing  MONITOR:   Diet advancement,TF tolerance,Labs,Weight trends  REASON FOR ASSESSMENT:   NPO/Clear Liquid Diet    ASSESSMENT:   Pt with PMH of DM, CKD 3, cirrhosis, and recent admissions - 08/18/20 for pyelonephritis; 09/18/20 acute appendicitis s/p appendectomy now readmitted 12/17 with hypovolemic shock and cholecystitis.  12/17-12/22 NPO 12/18 s/p paracentesis with 1.4L removed 12/21 s/p perc chole drain and paracentesis with 3.5 L removed  12/22 diet adv to CLD; meal completion 75% 12/23 s/p paracentesis with 1.85 L removed  12/30 s/p paracentesis with 4.5L removed  Pt is minimally responsive, remains on pressors (levophed 9 mcg/min) Plan for Cortrak today. NG tube placed 1/1 with tip in stomach Vital High Protein at 40 ml/hr with Pro-source TF BID started yesterday per Adult TF protocol  Admission weight: 73 kg  Current weight: 77.6 kg Noted pt with 2-3+ edema  In extremities, sacrum  CBGs >180, lantus daily added today. Previously just on sliding scale. Started on TF yesterday.   Labs: CBGs 169-264 (goal 140-180), Creatinine 2.87, BUN 70 Meds: ssnovolog, MVI with Minerals    Diet Order:   Diet Order    None      EDUCATION NEEDS:   Not appropriate for education at this time  Skin:  Skin Assessment: Skin Integrity Issues: Skin Integrity Issues::  Stage II Stage II: R buttocks  Last BM:  1/03  Height:   Ht Readings from Last 1 Encounters:  11/03/20 5\' 1"  (1.549 m)    Weight:   Wt Readings from Last 1 Encounters:  11/11/20 77.6 kg    Ideal Body Weight:  52.2 kg  BMI:  Body mass index is 32.31 kg/m.  Estimated Nutritional Needs:   Kcal:  1750-2000  Protein:  95-110 grams  Fluid:  > 1.7 L/day    Kerman Passey MS, RDN, LDN, CNSC Registered Dietitian III Clinical Nutrition RD Pager and On-Call Pager Number Located in [place]

## 2020-11-13 NOTE — Progress Notes (Signed)
[place] Progress Note Patient Name: Whitney [name] DOB: 1935/07/25 MRN: [id number]   Date of Service  11/13/2020  HPI/Events of Note  Oliguria, was given a fluid bolus last night  eICU Interventions  Albumin x 1     Intervention Category Major Interventions: Acute renal failure - evaluation and management  Margaretmary Lombard 11/13/2020, 3:54 AM

## 2020-11-13 NOTE — Progress Notes (Signed)
[place] Progress Note Patient Name: Whitney [name] DOB: 07-Feb-1935 MRN: [id number]   Date of Service  11/13/2020  HPI/Events of Note  Glucose 264, on tube feed with Q4 checks but has SSI ordered TID and bedtime.   eICU Interventions  Changed the SSI also to q4h      Intervention Category Intermediate Interventions: Hyperglycemia - evaluation and treatment  [name] 11/13/2020, 4:40 AM

## 2020-11-13 NOTE — Progress Notes (Signed)
Inpatient Diabetes Program Recommendations  AACE/ADA: New Consensus Statement on Inpatient Glycemic Control (2015)  Target Ranges:  Prepandial:   less than 140 mg/dL      Peak postprandial:   less than 180 mg/dL (1-2 hours)      Critically ill patients:  140 - 180 mg/dL   Lab Results  Component Value Date   GLUCAP 255 (H) 11/13/2020   HGBA1C 7.8 (H) 08/18/2020    Review of Glycemic Control Results for Whitney [name], BEAL (MRN [id number]) as of 11/13/2020 09:46  Ref. Range 11/12/2020 15:36 11/12/2020 19:30 11/12/2020 23:55 11/13/2020 04:05 11/13/2020 08:07  Glucose-Capillary Latest Ref Range: 70 - 99 mg/dL 192 (H) 196 (H) 222 (H) 264 (H) 255 (H)   Diabetes history:  DM 2 Outpatient Diabetes medications:  Lantus 6 units q HS Current orders for Inpatient glycemic control:  Novolog sensitive tid q 4 hrs.  Inpatient Diabetes Program Recommendations:   Please consider adding home dose of Lantus 6 units daily.   Thank you, Nani Gasser. Logen Fowle, RN, MSN, CDE  Diabetes Coordinator Inpatient Glycemic Control Team Team Pager [phone] (8am-5pm) 11/13/2020 10:01 AM

## 2020-11-13 NOTE — Plan of Care (Signed)
  Problem: Clinical Measurements: Goal: Ability to maintain clinical measurements within normal limits will improve Outcome: Progressing Goal: Diagnostic test results will improve Outcome: Progressing Goal: Respiratory complications will improve Outcome: Progressing Goal: Cardiovascular complication will be avoided Outcome: Progressing   Problem: Nutrition: Goal: Adequate nutrition will be maintained Outcome: Progressing   Problem: Coping: Goal: Level of anxiety will decrease Outcome: Progressing   Problem: Elimination: Goal: Will not experience complications related to bowel motility Outcome: Progressing Goal: Will not experience complications related to urinary retention Outcome: Progressing   Problem: Pain Managment: Goal: General experience of comfort will improve Outcome: Progressing   Problem: Safety: Goal: Ability to remain free from injury will improve Outcome: Progressing   Problem: Skin Integrity: Goal: Risk for impaired skin integrity will decrease Outcome: Progressing   Problem: Education: Goal: Knowledge of General Education information will improve Description: Including pain rating scale, medication(s)/side effects and non-pharmacologic comfort measures Outcome: Not Progressing   Problem: Health Behavior/Discharge Planning: Goal: Ability to manage health-related needs will improve Outcome: Not Progressing   Problem: Clinical Measurements: Goal: Will remain free from infection Outcome: Not Progressing   Problem: Activity: Goal: Risk for activity intolerance will decrease Outcome: Not Progressing

## 2020-11-14 DIAGNOSIS — G9341 Metabolic encephalopathy: Secondary | ICD-10-CM | POA: Diagnosis not present

## 2020-11-14 DIAGNOSIS — R34 Anuria and oliguria: Secondary | ICD-10-CM | POA: Diagnosis not present

## 2020-11-14 DIAGNOSIS — R601 Generalized edema: Secondary | ICD-10-CM | POA: Diagnosis not present

## 2020-11-14 DIAGNOSIS — K810 Acute cholecystitis: Secondary | ICD-10-CM | POA: Diagnosis not present

## 2020-11-14 LAB — PHOSPHORUS: Phosphorus: 2.3 mg/dL — ABNORMAL LOW (ref 2.5–4.6)

## 2020-11-14 LAB — BLOOD GAS, VENOUS
Acid-base deficit: 4.9 mmol/L — ABNORMAL HIGH (ref 0.0–2.0)
Bicarbonate: 21.4 mmol/L (ref 20.0–28.0)
FIO2: 21
O2 Saturation: 68.2 %
Patient temperature: 37
pCO2, Ven: 52.8 mmHg (ref 44.0–60.0)
pH, Ven: 7.233 — ABNORMAL LOW (ref 7.250–7.430)

## 2020-11-14 LAB — GLUCOSE, CAPILLARY
Glucose-Capillary: 181 mg/dL — ABNORMAL HIGH (ref 70–99)
Glucose-Capillary: 181 mg/dL — ABNORMAL HIGH (ref 70–99)
Glucose-Capillary: 188 mg/dL — ABNORMAL HIGH (ref 70–99)
Glucose-Capillary: 221 mg/dL — ABNORMAL HIGH (ref 70–99)
Glucose-Capillary: 242 mg/dL — ABNORMAL HIGH (ref 70–99)
Glucose-Capillary: 252 mg/dL — ABNORMAL HIGH (ref 70–99)
Glucose-Capillary: 259 mg/dL — ABNORMAL HIGH (ref 70–99)

## 2020-11-14 LAB — BASIC METABOLIC PANEL
Anion gap: 9 (ref 5–15)
BUN: 72 mg/dL — ABNORMAL HIGH (ref 8–23)
CO2: 22 mmol/L (ref 22–32)
Calcium: 7.5 mg/dL — ABNORMAL LOW (ref 8.9–10.3)
Chloride: 103 mmol/L (ref 98–111)
Creatinine, Ser: 2.91 mg/dL — ABNORMAL HIGH (ref 0.44–1.00)
GFR, Estimated: 16 mL/min — ABNORMAL LOW (ref >60)
Glucose, Bld: 378 mg/dL — ABNORMAL HIGH (ref 70–99)
Potassium: 3.5 mmol/L (ref 3.5–5.1)
Sodium: 134 mmol/L — ABNORMAL LOW (ref 135–145)

## 2020-11-14 LAB — COMPREHENSIVE METABOLIC PANEL
ALT: 9 U/L (ref 0–44)
AST: 20 U/L (ref 15–41)
Albumin: 2.1 g/dL — ABNORMAL LOW (ref 3.5–5.0)
Alkaline Phosphatase: 138 U/L — ABNORMAL HIGH (ref 38–126)
Anion gap: 9 (ref 5–15)
BUN: 74 mg/dL — ABNORMAL HIGH (ref 8–23)
CO2: 21 mmol/L — ABNORMAL LOW (ref 22–32)
Calcium: 7.7 mg/dL — ABNORMAL LOW (ref 8.9–10.3)
Chloride: 109 mmol/L (ref 98–111)
Creatinine, Ser: 2.96 mg/dL — ABNORMAL HIGH (ref 0.44–1.00)
GFR, Estimated: 15 mL/min — ABNORMAL LOW (ref >60)
Glucose, Bld: 243 mg/dL — ABNORMAL HIGH (ref 70–99)
Potassium: 3.1 mmol/L — ABNORMAL LOW (ref 3.5–5.1)
Sodium: 139 mmol/L (ref 135–145)
Total Bilirubin: 2.4 mg/dL — ABNORMAL HIGH (ref 0.3–1.2)
Total Protein: 4.8 g/dL — ABNORMAL LOW (ref 6.5–8.1)

## 2020-11-14 LAB — PATHOLOGIST SMEAR REVIEW

## 2020-11-14 LAB — CBC
HCT: 25.5 % — ABNORMAL LOW (ref 36.0–46.0)
Hemoglobin: 8.8 g/dL — ABNORMAL LOW (ref 12.0–15.0)
MCH: 29.8 pg (ref 26.0–34.0)
MCHC: 34.5 g/dL (ref 30.0–36.0)
MCV: 86.4 fL (ref 80.0–100.0)
Platelets: 29 10*3/uL — CL (ref 150–400)
RBC: 2.95 MIL/uL — ABNORMAL LOW (ref 3.87–5.11)
RDW: 21.3 % — ABNORMAL HIGH (ref 11.5–15.5)
WBC: 13.2 10*3/uL — ABNORMAL HIGH (ref 4.0–10.5)
nRBC: 0.3 % — ABNORMAL HIGH (ref 0.0–0.2)

## 2020-11-14 LAB — MAGNESIUM: Magnesium: 2 mg/dL (ref 1.7–2.4)

## 2020-11-14 LAB — HAPTOGLOBIN: Haptoglobin: 82 mg/dL (ref 41–333)

## 2020-11-14 MED ORDER — METOLAZONE 5 MG PO TABS
5.0000 mg | ORAL_TABLET | Freq: Once | ORAL | Status: AC
  Administered 2020-11-14: 5 mg
  Filled 2020-11-14: qty 1

## 2020-11-14 MED ORDER — INSULIN ASPART 100 UNIT/ML ~~LOC~~ SOLN
3.0000 [IU] | SUBCUTANEOUS | Status: DC
  Administered 2020-11-14 – 2020-11-15 (×5): 3 [IU] via SUBCUTANEOUS

## 2020-11-14 MED ORDER — GERHARDT'S BUTT CREAM
TOPICAL_CREAM | Freq: Every day | CUTANEOUS | Status: DC | PRN
  Filled 2020-11-14 (×2): qty 1

## 2020-11-14 MED ORDER — FUROSEMIDE 10 MG/ML IJ SOLN
200.0000 mg | Freq: Once | INTRAVENOUS | Status: AC
  Administered 2020-11-14: 200 mg via INTRAVENOUS
  Filled 2020-11-14: qty 8

## 2020-11-14 MED ORDER — POTASSIUM & SODIUM PHOSPHATES 280-160-250 MG PO PACK
1.0000 | PACK | Freq: Once | ORAL | Status: AC
  Administered 2020-11-14: 1 via ORAL
  Filled 2020-11-14: qty 1

## 2020-11-14 MED ORDER — NOREPINEPHRINE 16 MG/250ML-% IV SOLN
0.0000 ug/min | INTRAVENOUS | Status: DC
  Administered 2020-11-14: 18 ug/min via INTRAVENOUS
  Administered 2020-11-15: 36 ug/min via INTRAVENOUS
  Administered 2020-11-15 – 2020-11-16 (×3): 40 ug/min via INTRAVENOUS
  Filled 2020-11-14 (×5): qty 250

## 2020-11-14 MED ORDER — POTASSIUM CHLORIDE 10 MEQ/50ML IV SOLN
10.0000 meq | INTRAVENOUS | Status: AC
  Administered 2020-11-14 (×2): 10 meq via INTRAVENOUS
  Filled 2020-11-14 (×2): qty 50

## 2020-11-14 MED ORDER — POTASSIUM CHLORIDE 20 MEQ PO PACK
20.0000 meq | PACK | ORAL | Status: AC
  Administered 2020-11-14 (×2): 20 meq
  Filled 2020-11-14 (×2): qty 1

## 2020-11-14 NOTE — Progress Notes (Signed)
Referring Physician(s): Gleason, Otilio Carpen (CCS)  Supervising Physician: Arne Cleveland  Patient Status:  [facility] - In-pt  Chief Complaint: None  Subjective:  History of acute calculus cholecystitis s/p percutaneous cholecystostomy tube placement in IR 10/31/2020. Patient laying in bed, eyes closed, no sedation. She does not respond to voice or painful stimuli. Gross anasarca noted with cutaneous weeping from all extremities/abdomen. Cholecystostomy tube site c/d/i.   Allergies: Succinylcholine and Milk-related compounds  Medications: Prior to Admission medications   Medication Sig Start Date End Date Taking? Authorizing Provider  acetaminophen (TYLENOL) 500 MG tablet Take 1,000 mg by mouth every 8 (eight) hours as needed for moderate pain or mild pain. 02/28/20  Yes [provider]  calcitonin, salmon, (MIACALCIN/FORTICAL) 200 UNIT/ACT nasal spray Place 1 spray into alternate nostrils daily. 03/30/20  Yes Vann, Jessica U, DO  furosemide (LASIX) 20 MG tablet Take 20 mg by mouth daily. 10/13/20  Yes [provider]  hydrocortisone cream 1 % Apply topically 2 (two) times daily. Patient taking differently: Apply 1 application topically 2 (two) times daily. 10/03/20  Yes Cato Mulligan, MD  ibuprofen (ADVIL) 200 MG tablet Take 200-400 mg by mouth daily as needed for headache (pain).   Yes [provider]  insulin glargine (LANTUS) 100 UNIT/ML Solostar Pen Inject 5 Units into the skin at bedtime. Patient taking differently: Inject 6 Units into the skin at bedtime. 04/12/20  Yes Argentina Donovan, PA-C  Multiple Vitamin (MULTIVITAMIN WITH MINERALS) TABS tablet Take 1 tablet by mouth daily.   Yes [provider]  Nutritional Supplements (,FEEDING SUPPLEMENT, PROSOURCE PLUS) liquid Take 30 mLs by mouth 2 (two) times daily between meals. 10/03/20  Yes Cato Mulligan, MD  oxyCODONE-acetaminophen (PERCOCET) 5-325 MG tablet Take 1-2 tablets by mouth every 8  (eight) hours as needed for severe pain. Patient taking differently: Take 1 tablet by mouth every 8 (eight) hours as needed for severe pain. 03/05/20  Yes Leandrew Koyanagi, MD  Vitamin D, Ergocalciferol, (DRISDOL) 1.25 MG (50000 UNIT) CAPS capsule Take 1 capsule (50,000 Units total) by mouth every 7 (seven) days. Patient taking differently: Take 50,000 Units by mouth every Wednesday. 03/30/20  Yes Vann, Jessica U, DO  escitalopram (LEXAPRO) 10 MG tablet Take 10 mg by mouth daily. 10/20/20   [provider]  famotidine (PEPCID) 20 MG tablet Take 1 tablet (20 mg total) by mouth 2 (two) times daily. Patient not taking: No sig reported 03/30/20   Geradine Girt, DO  Insulin Pen Needle (PEN NEEDLES) 30G X 5 MM MISC Use as instructed. 04/26/20   Argentina Donovan, PA-C  tamsulosin (FLOMAX) 0.4 MG CAPS capsule Take 1 capsule (0.4 mg total) by mouth daily. Patient not taking: No sig reported 09/01/20   Virl Axe, MD     Vital Signs: BP (!) 118/46   Pulse 88   Temp (!) 96.98 F (36.1 C)   Resp 15   Ht 5\' 1"  (1.549 m)   Wt 200 lb 9.9 oz (91 kg)   SpO2 100%   BMI 37.91 kg/m   Physical Exam Vitals and nursing note reviewed.  Constitutional:      General: She is not in acute distress.    Appearance: She is ill-appearing.     Comments: (+) warming blanket.  Pulmonary:     Effort: Pulmonary effort is normal. No respiratory distress.  Abdominal:     Comments: Cholecystostomy tube site without bleeding, erythema, drainage, or active bleeding; approximately 25 cc yellow bile  with debris in gravity bag; drain flushes without resistance.  Skin:    Comments: Gross anasarca noted with cutaneous weeping from all extremities/abdomen.  Neurological:     Comments: Does not respond to voice or painful stimuli.     Imaging: DG Chest Port 1 View  Result Date: 11/11/2020 CLINICAL DATA:  Attention, shortness of breath EXAM: PORTABLE CHEST 1 VIEW COMPARISON:  11/08/2019 FINDINGS: Right PICC line  in place with the tip at the cavoatrial junction. Low lung volumes. Extensive bilateral airspace disease. Small left pleural effusion, stable. Heart is normal size. No acute bony abnormality. IMPRESSION: Extensive bilateral airspace disease, slightly worsened since prior study. Small left pleural effusion. Electronically Signed   By: Rolm Baptise M.D.   On: 11/11/2020 13:23   DG Abd Portable 1V  Result Date: 11/11/2020 CLINICAL DATA:  Nasogastric tube placement EXAM: PORTABLE ABDOMEN - 1 VIEW COMPARISON:  11/11/2020 FINDINGS: Nasogastric tube tip is seen within the mid body of the stomach. Right upper quadrant cholecystostomy catheter, left double-J ureteral stent, and inferior vena cava filter are again identified and are unchanged. Left replacement type hip arthroplasty is again identified with methylmethacrylate medial to a medial acetabular wall defect. IMPRESSION: Nasogastric tube within the mid body of the stomach. Electronically Signed   By: Fidela Salisbury MD   On: 11/11/2020 21:33   DG Abd Portable 1V  Result Date: 11/11/2020 CLINICAL DATA:  NG tube insertion EXAM: PORTABLE ABDOMEN - 1 VIEW COMPARISON:  11/07/2020 FINDINGS: Interval placement of enteric tube, distal tip terminates within the mid left hemiabdomen. Partially visualized left-sided nephroureteral stent. A right sided pigtail drainage catheter is noted. Nonobstructive bowel gas pattern. IVC filter noted. Partially visualized left hip prosthesis with adjacent cement. IMPRESSION: Enteric tube terminates within the mid left hemiabdomen. A follow-up study could be performed after installation of enteric contrast to ensure appropriate positioning within the stomach. Electronically Signed   By: Davina Poke D.O.   On: 11/11/2020 14:53    Labs:  CBC: Recent Labs    11/11/20 0627 11/11/20 1340 11/12/20 0355 11/13/20 0428 11/14/20 0241  WBC 12.9*  --  11.0* 12.0* 13.2*  HGB 9.3* 10.5* 7.4* 7.9* 8.8*  HCT 28.1* 31.0* 23.8* 23.5*  25.5*  PLT 64*  --  58* 49* 29*    COAGS: Recent Labs    10/18/2020 1814 11/01/20 0410 11/02/20 0200 11/05/20 0256 11/13/20 1026  INR 1.5* 1.4* 1.9* 1.7* 3.3*  APTT 32  --   --   --   --     BMP: Recent Labs    03/19/20 0508 03/23/20 1137 03/26/20 0704 05/27/20 1033 08/17/20 1020 11/11/20 0627 11/11/20 1340 11/12/20 0355 11/13/20 0428 11/14/20 0241  NA  --  143  --  138   < > 143 144 145 136 139  K  --  4.6  --  4.2   < > 4.7 4.5 4.4 3.6 3.1*  CL  --  114*  --  107   < > 113*  --  112* 104 109  CO2  --  23  --  25   < > 20*  --  18* 16* 21*  GLUCOSE  --  139*  --  269*   < > 127*  --  169* 389* 243*  BUN  --  26*  --  37*   < > 71*  --  71* 70* 74*  CALCIUM  --  7.3*  --  8.3*   < > 8.0*  --  8.3* 8.0* 7.7*  CREATININE 1.47* 1.34* 1.28* 1.35*   < > 2.61*  --  2.74* 2.87* 2.96*  GFRNONAA 33* 37* 39* 36*   < > 18*  --  17* 16* 15*  GFRAA 38* 43* 45* 42*  --   --   --   --   --   --    < > = values in this interval not displayed.    LIVER FUNCTION TESTS: Recent Labs    11/11/20 0627 11/12/20 0355 11/13/20 0428 11/14/20 0241  BILITOT 1.4* 2.1* 2.0* 2.4*  AST 35 21 18 20   ALT 12 8 8 9   ALKPHOS 196* 144* 141* 138*  PROT 4.7* 5.2* 4.8* 4.8*  ALBUMIN 1.4* 3.0* 2.4* 2.1*    Assessment and Plan:  History of acute calculus cholecystitis s/p percutaneous cholecystostomy tube placement in IR 10/31/2020. Patient's condition guarded- currently in ICU on pressors and warming blanket, does not respond to voice or painful stimuli, gross anasarca with cutaneous weeping from all extremities/abdomen. Cholecystostomy tube stable with approximately 25 cc yellow bile with debris in gravity bag (additional 10 cc output from drain in past 24 hours per chart- however per RN was emptied an additional amount during this shift (has not been documented)). Drain appears to be functioning properly at this time. For possible NM study today to evaluate for bile leak. Continue current drain  management- continue with Qshift flushes/monitor of output. Further plans per CCM/urology- appreciate and agree with management. IR to follow.   Electronically Signed: Earley Abide, PA-C 11/14/2020, 9:39 AM   I spent a total of 25 Minutes at the the patient's bedside AND on the patient's hospital floor or unit, greater than 50% of which was counseling/coordinating care for acute calculous cholecystitis s/p cholecystostomy tube placement.

## 2020-11-14 NOTE — Progress Notes (Signed)
I left a voicemail with the social worker reference pt's expressed wish that she be buried in her home country, [place].

## 2020-11-14 NOTE — Progress Notes (Signed)
Date and time results received: 11/14/20 0340   Test: Platelets Critical Value: 29  Name of Provider Notified: Elizebeth Brooking, RN Warren Lacy)  Orders Received? Or Actions Taken?:

## 2020-11-14 NOTE — Progress Notes (Signed)
NAMECarlota Philley, MRN:  [id number], DOB:  [birth year], LOS: 105 ADMISSION DATE:  10/29/2020, CONSULTATION DATE:  10/18/2020 REFERRING MD:  Reather Converse, CHIEF COMPLAINT:  Abdominal pain  Brief History   85yF with septic and hypovolemic shock with likely intraabdominal source  History of present illness   85yF with DM2, cirrhosis, CKD3, chronic R hip fracture, recent admission for emphysematous cystitis and pyelitis due to ESBL klebsiella, last discharged 11/23 from admission for acute appendicitis s/p appendectomy 09/18/20. Her course was complicated by recurrent abdominal pain, found to have imaging suggestive of acute cholecystitis although interpretation challengign in setting of her anasarca - she was not felt at the time to be candidate for c-tube (IR concerned at the time for long term infectious complication in setting of her ascites) or surgery due to frailty. When she was discharged her daughter says that she barely ate or drank anything. This evening she developed sudden onset sharp lower abdominal pain prompting their visit to the ED. She's had no nausea, vomiting, diarrhea, hematochezia/melena, fever.  In ED she was given 2L IVF, meropenem, started on levophed through a peripheral IV  Past Medical History  DM2 Cirrhosis [facility] Events   12/17 Admitted 12/21 s/p perc chole drain  with + cx from this. On abx and will change as sensitivities return. S/p paracentesis  12/23 remains on levo at 20mcg will give 1uprbc for hgb 7.5 this am and follow. Cont midodrine started yesterday. Repeat para done 12/30 para 4.5L, start HCAP coverage  Consults:  Urology General surgery  Procedures:  Paracentesis 12/17 Arterial line 12/17 >> 12/21 IR PERC cholecystectomy and para 12/21  Repeat para 12/23  Significant Diagnostic Tests:  CT A/P 12/17 with rapid accumulation of complex appearing ascites, emphysematous pyelo which may have acute component, bowel wall thickening,  cholelithiasis  Micro Data:  See micro tab  Antimicrobials:  See fever tab  Interim history/subjective:  Hypokalemic overnight, received replacement. Continues to be tachypneic, now on 4L [place].  Minimally responsive.  Objective   Blood pressure (!) 108/37, pulse 81, temperature (!) 96.98 F (36.1 C), resp. rate 14, height 5\' 1"  (1.549 m), weight 91 kg, SpO2 100 %.        Intake/Output Summary (Last 24 hours) at 11/14/2020 0647 Last data filed at 11/14/2020 9678 Gross per 24 hour  Intake 4264.91 ml  Output 633 ml  Net 3631.91 ml   Filed Weights   11/10/20 0310 11/11/20 0500 11/14/20 0500  Weight: 79 kg 77.6 kg 91 kg    Examination: General: Critically ill appearing elderly female, laying in bed, NAD HEENT: Normocephalic, atraumatic, mucosal bleeding around gums Cardiac: RRR, no m/r/g Pulmonary: On 4L [place], mild increased WOB, decreased breath sounds Abdomen: Soft, nontender, biliary drain in place with bilious output Extremity: Diffuse anasarca and weeping of arms, multiple bruising noted Neuro: Minimally responsive, unable to answer questions  Resolved Hospital Problem list   Lactic acidosis  Assessment & Plan:   Shock, presumed to be septic 2/2 below  Cholecystitis, klebsiella peritonitis HCAP Klebsiella infection -HIDA scan with acute cholecystitis. IR placement of percutaneous cholecystostomy 12/21. Continues to have biliary output. -Repeat blood cultures 1/1 showed NGTD x24 hours  P: - Continue zosyn (12/31 > ) - Blood/sputum/urine cultures - Cortrak/midodrine/albumin - Remains on Levophed, wean as tolerated  Acute anemia:  Hgb went down to 7.4, Hgb earlier during admission around 10-11. Appears to have decreased around time of paracentesis. Could be 2/2 hematoma vs DIC. DIC is less  likely due no schistocytes noted on smear. Likely 2/2 sepsis from above.  -D-dimer 6.6. Fibrinogen 149. PT/INR 32/3.3.  -Technologist smear review showed target cells, decreased  platelets, and unremarkable WBC morphology -Hgb today up to 8.8  -Daily CBC  Ascites -Due to decompensated cirrhosis , peritonitis, note fluid creatinine 2.0 (same as serum creatinine) question urinarperitoneal communication  P: - Continue antibiotics as above - Urology consulted with no acute GU interventions recommended  Acute metabolic encephalopathy-  Likely multifactorial. Continues to be minimally responsive.  - Ammonia WNL 2 days ago - High risk for progressing to needing intubation depending on GOC  AKI on CKD Metabolic acidosis Continues to worsen. Cr BL around 1.2-1.7. Minimal urine output noted.  -VBG showed pH 7.23, Co2 52, O2 68.  -Cr today 2.96 -Patient started on bicarb drip 1/3 -Bicarb improved mildly to 21  P: - Follow renal function and urine output - Continue Bicarb drip -Trial of high dose lasix and metolazone -Trend BMET - Avoid nephrotoxins - Ensure adequate renal perfusion - Renally dose medications  Severe protein calorie malnutrition -Initiate tube feed  Hyperglycemia: CBGs ranging around 181-237. Currently on SSI and long acting insulin.  -Increase lantus today -Continue SSI   Best practice:  Diet: NPO PEPUP Pain/Anxiety/Delirium protocol (if indicated): hold VAP protocol (if indicated): no DVT prophylaxis: heparin GI prophylaxis: not indicated Glucose control: SSI Mobility: BR Code Status: Full Family Communication: pending Disposition: Remain in ICU  CRITICAL CARE Performed by: Asencion Noble

## 2020-11-14 NOTE — Progress Notes (Signed)
[place] Progress Note Patient Name: Whitney [name] DOB: 1934-12-01 MRN: [id number]   Date of Service  11/14/2020  HPI/Events of Note  K+ 3.1, Creat 2.96, Platelets 29 K.  eICU Interventions  K+ replaced according to modified adult electrolyte replacement protocol, continue to monitor platelet count and transfuse for platelets < 10 K or active bleeding.        Kerry Kass Harleigh Civello 11/14/2020, 4:29 AM

## 2020-11-14 NOTE — Progress Notes (Signed)
RT attempted ABG X's 2 unable to obtain, pt only on 1L of oxygen satting 100%. RT recommended VBG. RN aware.

## 2020-11-14 NOTE — Plan of Care (Signed)
  Problem: Clinical Measurements: Goal: Will remain free from infection Outcome: Progressing   Problem: Activity: Goal: Risk for activity intolerance will decrease Outcome: Progressing   Problem: Nutrition: Goal: Adequate nutrition will be maintained Outcome: Progressing   Problem: Coping: Goal: Level of anxiety will decrease Outcome: Progressing   Problem: Pain Managment: Goal: General experience of comfort will improve Outcome: Progressing   Problem: Safety: Goal: Ability to remain free from injury will improve Outcome: Progressing   Problem: Education: Goal: Knowledge of General Education information will improve Description: Including pain rating scale, medication(s)/side effects and non-pharmacologic comfort measures Outcome: Not Progressing   Problem: Health Behavior/Discharge Planning: Goal: Ability to manage health-related needs will improve Outcome: Not Progressing   Problem: Clinical Measurements: Goal: Ability to maintain clinical measurements within normal limits will improve Outcome: Not Progressing Goal: Diagnostic test results will improve Outcome: Not Progressing Goal: Respiratory complications will improve Outcome: Not Progressing Goal: Cardiovascular complication will be avoided Outcome: Not Progressing   Problem: Elimination: Goal: Will not experience complications related to bowel motility Outcome: Not Progressing Goal: Will not experience complications related to urinary retention Outcome: Not Progressing   Problem: Skin Integrity: Goal: Risk for impaired skin integrity will decrease Outcome: Not Progressing

## 2020-11-14 NOTE — Progress Notes (Signed)
This chaplain responded to the referral and was updated by the Pt. RN-Elizabeth for Pt. and family spiritual care.  The Pt. is resting comfortably and the [name] is bedside.  The chaplain is present with the family in the silence and through the words of peace from the [facility].  The chaplain offered F/U spiritual care as needed.

## 2020-11-14 NOTE — Progress Notes (Signed)
This RN discussed worsening clinical status with pt's daughter, Caro Hight. Daughter stated that if her mother was to pass overnight, she does not want to be called due to traumatic incident with her father's death in the past. Instead, stated that she will be on the unit at 0730 AM and will take updates then. This RN explained that if the pt were to expire overnight, she would likely not be in her current room when she ([name]) arrives. This RN emphasized that the pt would more than likely be in the morgue, should the pt pass, when she arrives. Daughter endorses understanding of situation and would still like to go forth with receiving updates in the morning, even if death occurs.

## 2020-11-14 NOTE — Plan of Care (Signed)
I updated Ms. Sledge's daughter at bedside. She understands my concerns about her progressive organ failure and multiple hospitalizations. She understands that this is likely to be a terminal hospitalization. She wants to change CODE STATUS to DNR. She wants to avoid suffering for her mom but feels that right now she is comfortable. She is working on Dietitian resources together to be able to meet her mother's wish to be able to be buried in her native country. She has a brother in [place] who is trying to get [place] to see her. RN Benjamine Mola present throughout discussion. Will continue to follow up daily and answer questions.   Julian Hy, DO 11/14/20 12:03 PM [facility]

## 2020-11-15 DIAGNOSIS — K810 Acute cholecystitis: Secondary | ICD-10-CM | POA: Diagnosis not present

## 2020-11-15 DIAGNOSIS — N179 Acute kidney failure, unspecified: Secondary | ICD-10-CM | POA: Diagnosis not present

## 2020-11-15 DIAGNOSIS — A419 Sepsis, unspecified organism: Secondary | ICD-10-CM | POA: Diagnosis not present

## 2020-11-15 DIAGNOSIS — R188 Other ascites: Secondary | ICD-10-CM | POA: Diagnosis not present

## 2020-11-15 LAB — CBC
HCT: 29.3 % — ABNORMAL LOW (ref 36.0–46.0)
Hemoglobin: 9.8 g/dL — ABNORMAL LOW (ref 12.0–15.0)
MCH: 28.7 pg (ref 26.0–34.0)
MCHC: 33.4 g/dL (ref 30.0–36.0)
MCV: 85.9 fL (ref 80.0–100.0)
Platelets: 29 10*3/uL — CL (ref 150–400)
RBC: 3.41 MIL/uL — ABNORMAL LOW (ref 3.87–5.11)
RDW: 21.9 % — ABNORMAL HIGH (ref 11.5–15.5)
WBC: 14.6 10*3/uL — ABNORMAL HIGH (ref 4.0–10.5)
nRBC: 0.5 % — ABNORMAL HIGH (ref 0.0–0.2)

## 2020-11-15 LAB — SUSCEPTIBILITY RESULT

## 2020-11-15 LAB — COMPREHENSIVE METABOLIC PANEL
ALT: 12 U/L (ref 0–44)
AST: 30 U/L (ref 15–41)
Albumin: 1.9 g/dL — ABNORMAL LOW (ref 3.5–5.0)
Alkaline Phosphatase: 169 U/L — ABNORMAL HIGH (ref 38–126)
Anion gap: 12 (ref 5–15)
BUN: 78 mg/dL — ABNORMAL HIGH (ref 8–23)
CO2: 22 mmol/L (ref 22–32)
Calcium: 7.4 mg/dL — ABNORMAL LOW (ref 8.9–10.3)
Chloride: 105 mmol/L (ref 98–111)
Creatinine, Ser: 2.97 mg/dL — ABNORMAL HIGH (ref 0.44–1.00)
GFR, Estimated: 15 mL/min — ABNORMAL LOW (ref >60)
Glucose, Bld: 174 mg/dL — ABNORMAL HIGH (ref 70–99)
Potassium: 3.3 mmol/L — ABNORMAL LOW (ref 3.5–5.1)
Sodium: 139 mmol/L (ref 135–145)
Total Bilirubin: 3 mg/dL — ABNORMAL HIGH (ref 0.3–1.2)
Total Protein: 5.1 g/dL — ABNORMAL LOW (ref 6.5–8.1)

## 2020-11-15 LAB — MAGNESIUM: Magnesium: 1.9 mg/dL (ref 1.7–2.4)

## 2020-11-15 LAB — SUSCEPTIBILITY, AER + ANAEROB: Source of Sample: 8680

## 2020-11-15 LAB — GLUCOSE, CAPILLARY
Glucose-Capillary: 143 mg/dL — ABNORMAL HIGH (ref 70–99)
Glucose-Capillary: 115 mg/dL — ABNORMAL HIGH (ref 70–99)
Glucose-Capillary: 113 mg/dL — ABNORMAL HIGH (ref 70–99)
Glucose-Capillary: 69 mg/dL — ABNORMAL LOW (ref 70–99)
Glucose-Capillary: 117 mg/dL — ABNORMAL HIGH (ref 70–99)
Glucose-Capillary: 94 mg/dL (ref 70–99)

## 2020-11-15 LAB — PHOSPHORUS: Phosphorus: 2.2 mg/dL — ABNORMAL LOW (ref 2.5–4.6)

## 2020-11-15 MED ORDER — HYDROMORPHONE HCL 1 MG/ML IJ SOLN
0.5000 mg | INTRAMUSCULAR | Status: DC | PRN
  Administered 2020-11-15 (×2): 0.5 mg via INTRAVENOUS
  Filled 2020-11-15 (×2): qty 0.5

## 2020-11-15 MED ORDER — HYDROMORPHONE HCL 1 MG/ML IJ SOLN
0.5000 mg | INTRAMUSCULAR | Status: DC | PRN
  Filled 2020-11-15: qty 0.5

## 2020-11-15 MED ORDER — METOCLOPRAMIDE HCL 5 MG/ML IJ SOLN
5.0000 mg | Freq: Three times a day (TID) | INTRAMUSCULAR | Status: DC
  Administered 2020-11-15 (×2): 5 mg via INTRAVENOUS
  Filled 2020-11-15 (×2): qty 2

## 2020-11-15 MED ORDER — DEXTROSE 50 % IV SOLN: 12.5000 g | Freq: Once | INTRAVENOUS | Status: AC

## 2020-11-15 MED ORDER — DEXTROSE 50 % IV SOLN
INTRAVENOUS | Status: AC
  Administered 2020-11-15: 12.5 g via INTRAVENOUS
  Filled 2020-11-15: qty 50

## 2020-11-15 MED ORDER — GLYCOPYRROLATE 0.2 MG/ML IJ SOLN
0.1000 mg | Freq: Three times a day (TID) | INTRAMUSCULAR | Status: DC
  Administered 2020-11-15 (×3): 0.1 mg via INTRAVENOUS
  Filled 2020-11-15 (×3): qty 1

## 2020-11-15 NOTE — Progress Notes (Signed)
NAMEMeera [name], MRN:  [id number], DOB:  06-12-1935, LOS: 46 ADMISSION DATE:  11/05/2020, CONSULTATION DATE:  10/26/2020 REFERRING MD:  Reather Converse, CHIEF COMPLAINT:  Abdominal pain  Brief History   85yF with septic and hypovolemic shock with likely intraabdominal source  History of present illness   85yF with DM2, cirrhosis, CKD3, chronic R hip fracture, recent admission for emphysematous cystitis and pyelitis due to ESBL klebsiella, last discharged 11/23 from admission for acute appendicitis s/p appendectomy 09/18/20. Her course was complicated by recurrent abdominal pain, found to have imaging suggestive of acute cholecystitis although interpretation challengign in setting of her anasarca - she was not felt at the time to be candidate for c-tube (IR concerned at the time for long term infectious complication in setting of her ascites) or surgery due to frailty. When she was discharged her daughter says that she barely ate or drank anything. This evening she developed sudden onset sharp lower abdominal pain prompting their visit to the ED. She's had no nausea, vomiting, diarrhea, hematochezia/melena, fever.  In ED she was given 2L IVF, meropenem, started on levophed through a peripheral IV  Past Medical History  DM2 Cirrhosis [facility] Events   12/17 Admitted 12/21 s/p perc chole drain  with + cx from this. On abx and will change as sensitivities return. S/p paracentesis  12/23 remains on levo at 72mcg will give 1uprbc for hgb 7.5 this am and follow. Cont midodrine started yesterday. Repeat para done 12/30 para 4.5L, start HCAP coverage  Consults:  Urology General surgery  Procedures:  Paracentesis 12/17 Arterial line 12/17 >> 12/21 IR PERC cholecystectomy and para 12/21  Repeat para 12/23  Significant Diagnostic Tests:  CT A/P 12/17 with rapid accumulation of complex appearing ascites, emphysematous pyelo which may have acute component, bowel wall thickening,  cholelithiasis  Micro Data:  See micro tab  Antimicrobials:  See fever tab  Interim history/subjective:  Acute worsening overnight, remains tachycardic and hypotensive. Patient remains minimally responsive. Episode of emesis while being examined.   Objective   Blood pressure (!) 95/50, pulse (!) 134, temperature 100.2 F (37.9 C), resp. rate (!) 27, height 5\' 1"  (1.549 m), weight 87.5 kg, SpO2 96 %.        Intake/Output Summary (Last 24 hours) at 11/15/2020 0645 Last data filed at 11/15/2020 0600 Gross per 24 hour  Intake 4606.59 ml  Output 565 ml  Net 4041.59 ml   Filed Weights   11/11/20 0500 11/14/20 0500 11/15/20 0500  Weight: 77.6 kg 91 kg 87.5 kg    Examination: General: Critically ill appearing elderly female HEENT: Normocephalic, atraumatic, mucosal bleeding around gums Cardiac: Tachycardic, no m/r/g Pulmonary: On 4L [place], mild increased WOB, abdominal breathing noted, decreased breath sounds Abdomen: Soft, nontender, biliary drain in place with bilious output Extremity: Diffuse anasarca and weeping of arms, multiple bruising noted Neuro: Minimally responsive, unable to answer questions  Resolved Hospital Problem list   Lactic acidosis  Assessment & Plan:   Shock, presumed to be septic 2/2 below  Cholecystitis, klebsiella peritonitis HCAP Klebsiella infection -HIDA scan with acute cholecystitis. IR placement of percutaneous cholecystostomy 12/21. Continues to have biliary output. -Repeat blood cultures 1/1 showed NGTD x3 days -Significant worsening overnight, tachycardic and minimally responsive. Requiring increase in levophed. High risk of sudden decompensation.   P: - Continue zosyn (12/31 > ) - Cortrak/midodrine/albumin - Remains on Levophed  Acute anemia:  Hgb went down to 7.4, Hgb earlier during admission around 10-11. Appears to have  decreased around time of paracentesis. Could be 2/2 hematoma vs DIC. DIC is less likely due no schistocytes noted on  smear. Likely 2/2 sepsis from above.  -D-dimer 6.6. Fibrinogen 149. PT/INR 32/3.3.  -Technologist smear review showed target cells, decreased platelets, and unremarkable WBC morphology -Hgb today up to 9.8 -Daily CBC  Ascites -Due to decompensated cirrhosis , peritonitis, note fluid creatinine 2.0 (same as serum creatinine) question urinarperitoneal communication  P: - Continue antibiotics as above - Urology consulted with no acute GU interventions recommended  Acute metabolic encephalopathy-  Likely multifactorial. Continues to be minimally responsive.  - Ammonia WNL 2 days ago  AKI on CKD NAG Metabolic acidosis Continues to worsen. Cr BL around 1.2-1.7. Minimal urine output noted. Trial of high dose lasix given 1/4 with no response.  -VBG showed pH 7.23, Co2 52, O2 68.  -Cr today 2.96 -Patient started on bicarb drip 1/3 -Bicarb improved mildly to 21  P: - Follow renal function and urine output - Continue Bicarb drip -Trend BMET - Avoid nephrotoxins - Ensure adequate renal perfusion - Renally dose medications  Severe protein calorie malnutrition -Initiate tube feed  Hyperglycemia: CBGs ranging around 181-237. Currently on SSI and long acting insulin.  -Increase long acting insulin today -Continue SSI   Best practice:  Diet: NPO PEPUP Pain/Anxiety/Delirium protocol (if indicated): hold VAP protocol (if indicated): no DVT prophylaxis: heparin GI prophylaxis: not indicated Glucose control: SSI Mobility: BR Code Status: DNR Family Communication: pending Disposition: Remain in ICU  CRITICAL CARE Performed by: Asencion Noble

## 2020-11-16 LAB — GLUCOSE, CAPILLARY: Glucose-Capillary: 83 mg/dL (ref 70–99)

## 2020-11-16 LAB — CULTURE, BLOOD (ROUTINE X 2)
Culture: NO GROWTH
Culture: NO GROWTH

## 2020-11-16 MED ORDER — DEXTROSE 50 % IV SOLN
25.0000 g | INTRAVENOUS | Status: AC
  Administered 2020-11-16: 25 g via INTRAVENOUS

## 2020-11-16 MED ORDER — DEXTROSE 50 % IV SOLN
INTRAVENOUS | Status: AC
  Filled 2020-11-16: qty 50

## 2020-11-17 LAB — GLUCOSE, CAPILLARY: Glucose-Capillary: 51 mg/dL — ABNORMAL LOW (ref 70–99)

## 2020-11-19 LAB — BODY FLUID CULTURE

## 2020-11-30 LAB — FUNGUS CULTURE WITH STAIN

## 2020-11-30 LAB — FUNGAL ORGANISM REFLEX

## 2020-11-30 LAB — FUNGUS CULTURE RESULT

## 2020-12-12 NOTE — Death Summary Note (Signed)
  Name: Whitney [name] MRN: [id number] DOB: 26-Apr-1935 86 y.o.  Date of Admission: 10/17/2020  5:54 PM Date of Discharge: Dec 12, 2020 Attending Physician: Julian Hy, DO  Discharge Diagnosis: Principal Problem:   HCAP (healthcare-associated pneumonia) Active Problems:   Uncontrolled diabetes mellitus ([place])   CKD (chronic kidney disease), stage III ([place])   Protein-calorie malnutrition, severe ([place])   Pyelitis   Pressure injury of skin   Septic shock due to Klebsiella pneumoniae ([place])   Other ascites   Obesity (BMI 30-39.9)   Cirrhosis ([place])   AKI (acute kidney injury) ([place])   Acute cholecystitis   Atrial fibrillation with RVR ([place])   Cause of death: Multiorgan failure 2/2 septic shock Time of death: 0455  Disposition and follow-up:   Ms.[name] was discharged from [facility] in expired condition.    Hospital Course: This is an 86 year old female with history of diabetes, cirrhosis, CKD stage III, chronic right hip fracture, recent hospital admissions for emphysematous cystitis and pyelitis due to ESBL Klebsiella, acute appendicitis status post appendectomy, who returned to [facility] on 12/17 for worsening sudden onset of lower abdominal pain and found to be in shock 2/2 sepsis versus hypovolemia requiring ICU admission.  CT abdomen pelvis showed rapid accumulation of complex ascites, emphysematous pyelo, bowel wall thickening, and cholelithiasis.  Also noted to have an AKI and elevated lactic acidosis.  Paracentesis was performed with large number of WBCs.  Patient was started on Levophed and empiric meropenem and given fluid boluses. Urology was consulted regarding imaging findings and history of emphysematous cystitis/pyelitis, did not feel ascites related to past UTI, with low creatinine in ascitic fluid, recommended coverage for possible UTI but no further GU intervention. Surgery was consulted, no source noted on CT scan, recommended antibiotics  and follow-up culture.  HIDA scan findings showed cystic duct obstruction/acute cholecystitis.  Patient underwent percutaneous cholecystotomy and paracentesis by IR on 12/21.  Fluid cultures grew out Klebsiella and antibiotics were narrowed to ceftriaxone and Flagyl.  Patient was able to be weaned off of pressors and transferred out of the ICU on midodrine.  Patient then developed worsening white count, elevated lactic acidosis, worsening kidney function. Started on IV fluids and HCAP coverage.  Patient then developed worsening altered mental status and hypotension requiring pressor support and transferred back to the ICU on 1/1.  NG tube was placed and patient started on midodrine.  Patient developed oliguric AKI, metabolic acidosis, and remained minimally responsive during hospitalization.  She was continued on antibiotics, Levophed drip, and bicarb drip with no improvement.  Repeat goals of care discussion with daughter on 1/4 was done, discussed hat is likely a terminal hospitalization, continued aggressive care with change to DNR status.  Patient was continued on supportive care with pressure support, antibiotics, bicarb drip, and Dilaudid for pain control for comfort. Continued to decline and patient pronounced dead on on 12-Dec-2020.   Signed: Asencion Noble, MD 12/12/2020, 7:07 AM

## 2020-12-12 NOTE — Progress Notes (Signed)
Asked to come see patient to pronounce patient at daughters request  No heartbeat  Spoke with daughter at bedside, confirmed she is passed

## 2020-12-12 DEATH — deceased

## 2022-03-12 IMAGING — DX DG TIBIA/FIBULA PORT 2V*R*
1 series · 1 of 1 positions shown · non-contrast
Comparison: None.

CLINICAL DATA: Evaluate for fracture.

EXAM:
PORTABLE RIGHT TIBIA AND FIBULA - 2 VIEW

[tibia lat]
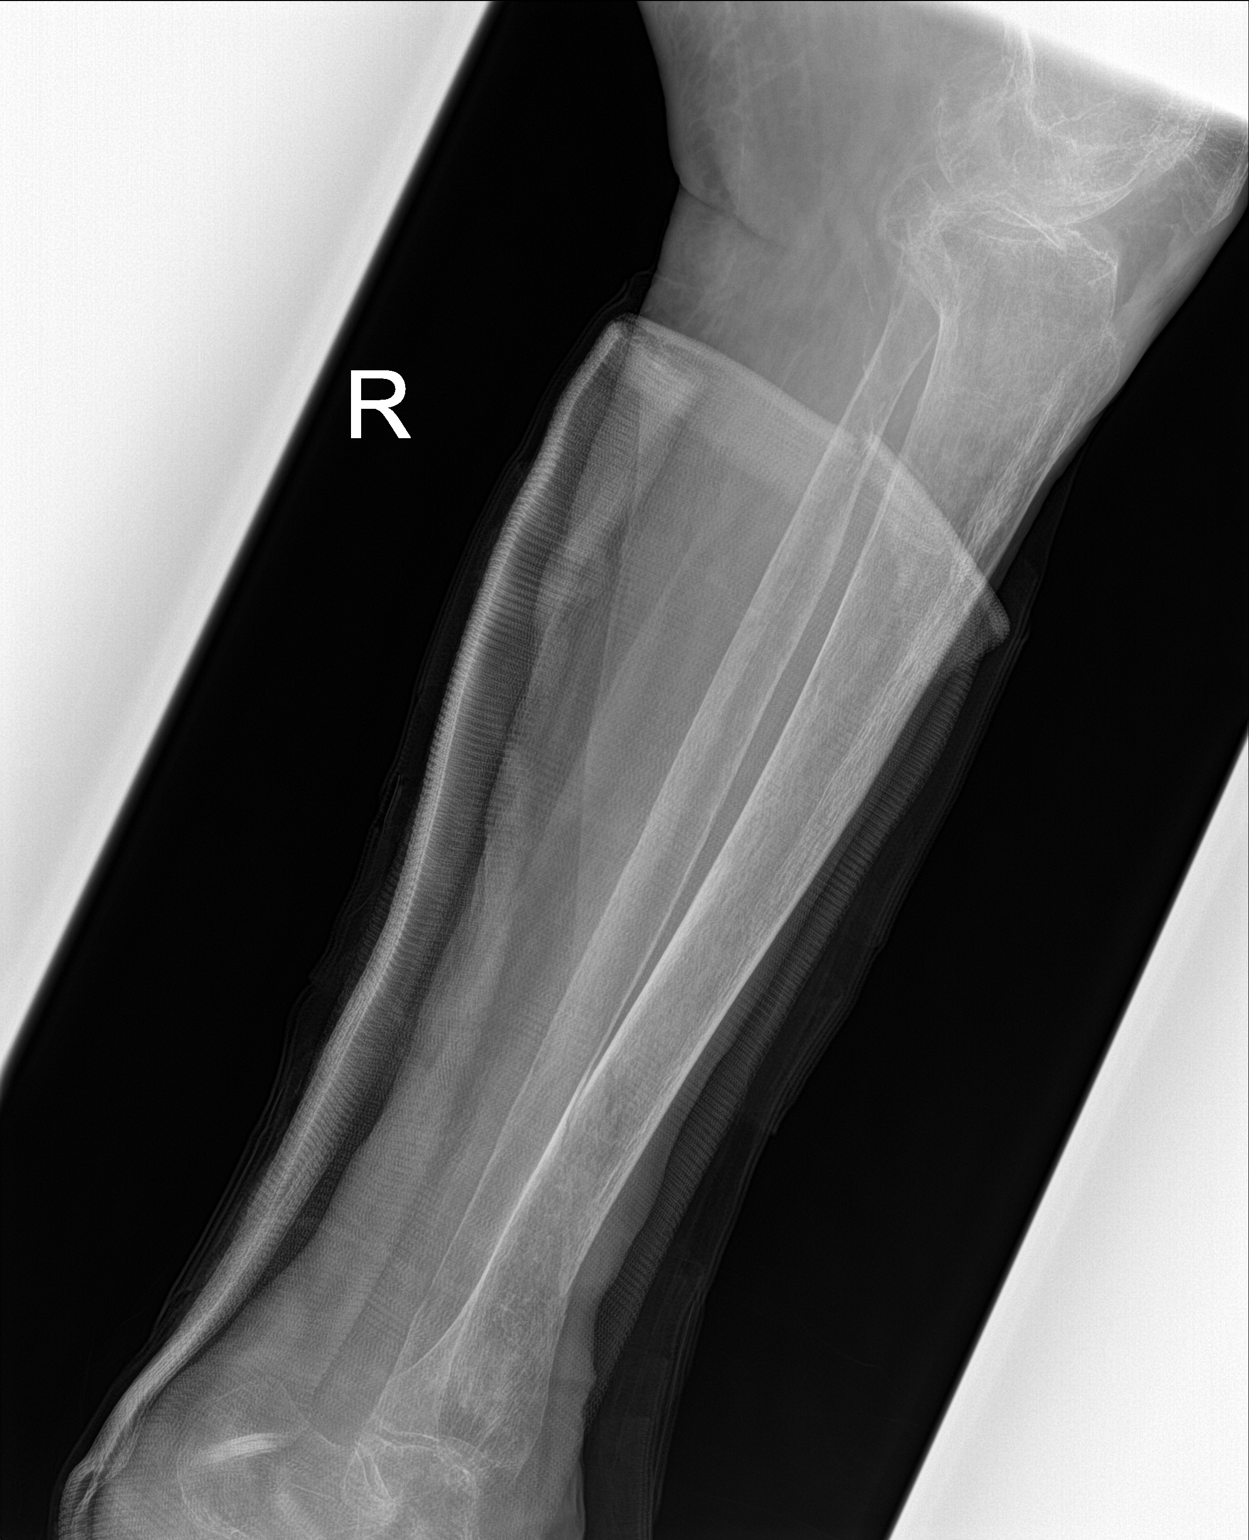

[1 of 1 positions shown; findings below may reference images not displayed]

FINDINGS: The patient is within a cast. Nondisplaced fractures of the distal
tibia and fibula are identified. No other abnormalities.
IMPRESSION: Nondisplaced fractures of the distal tibia and fibula. The patient
is in a cast.

## 2022-03-31 IMAGING — US US ABDOMEN LIMITED
1 series · 14 of 25 positions shown · non-contrast
Comparison: CT abdomen pelvis dated March 09, 2020.

CLINICAL DATA: Abdominal pain.

EXAM:
ULTRASOUND ABDOMEN LIMITED RIGHT UPPER QUADRANT

[Series 1: us abdomen limited ruq · 14 of 32 slices shown]
[im 1/32]
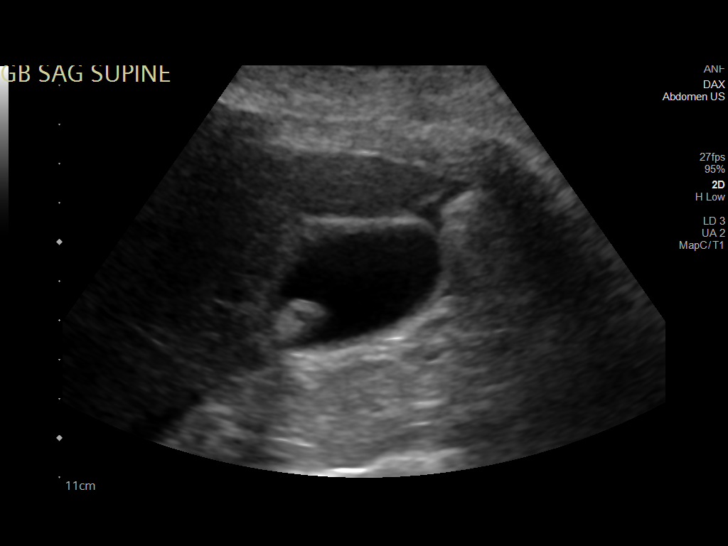
[im 3/32]
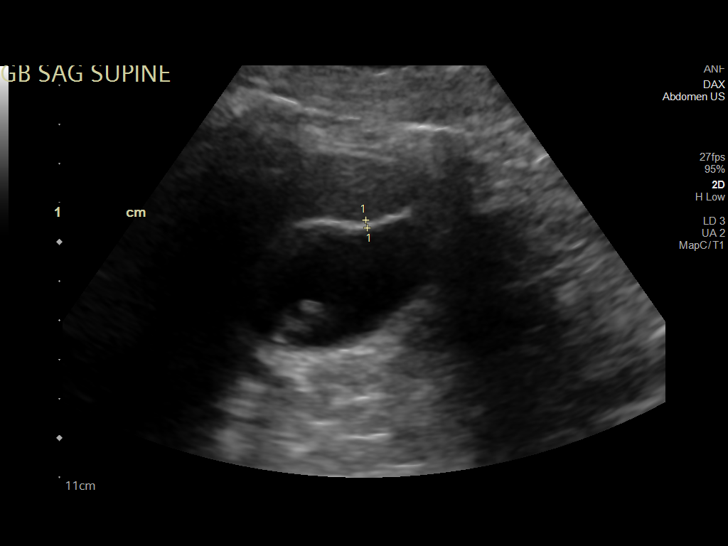
[im 6/32]
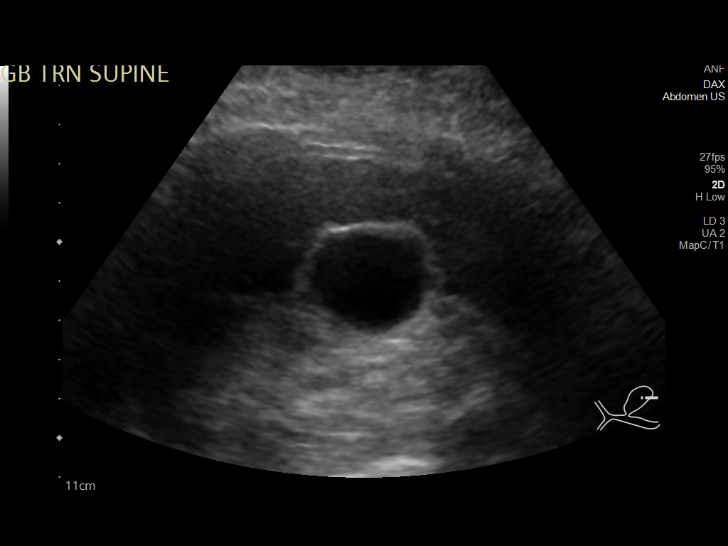
[im 8/32]
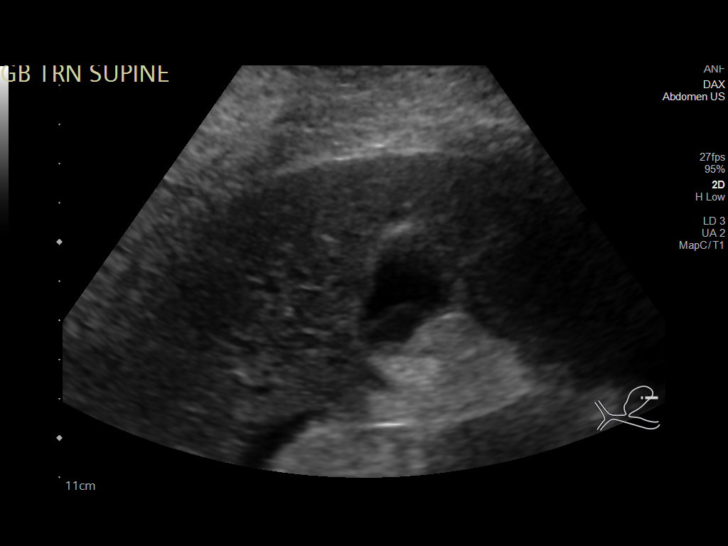
[im 11/32]
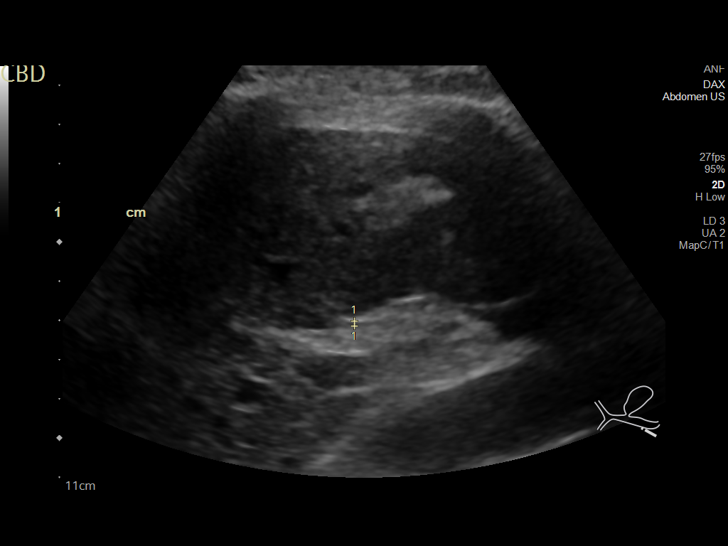
[im 12/32]
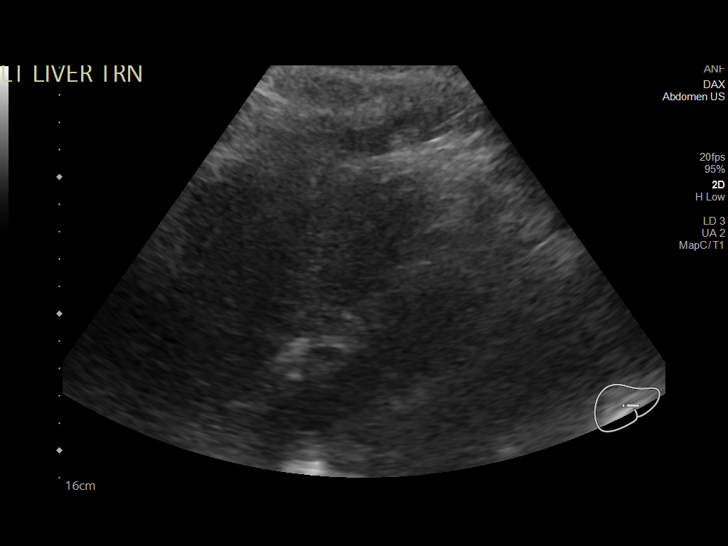
[im 15/32]
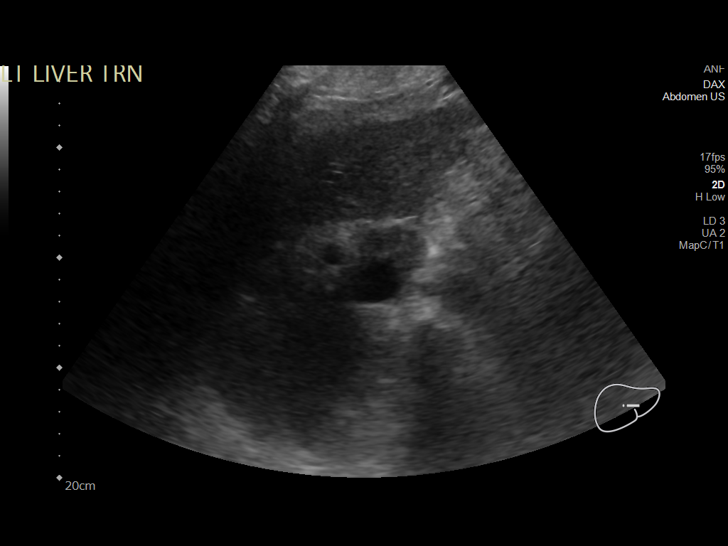
[im 17/32]
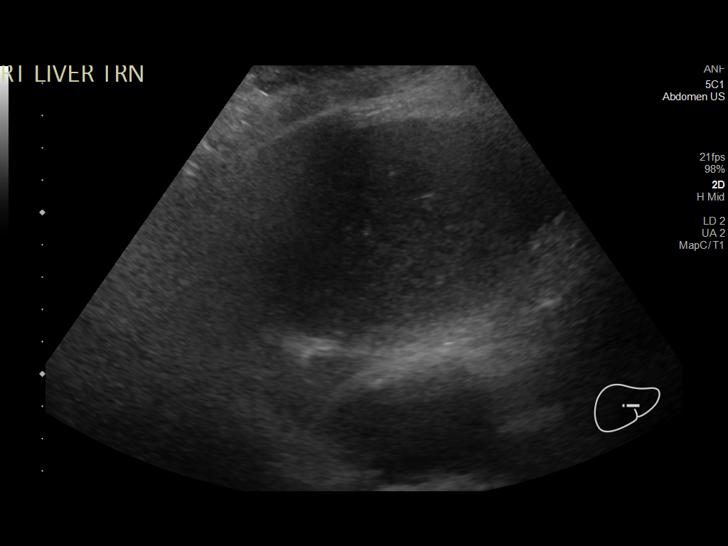
[im 20/32]
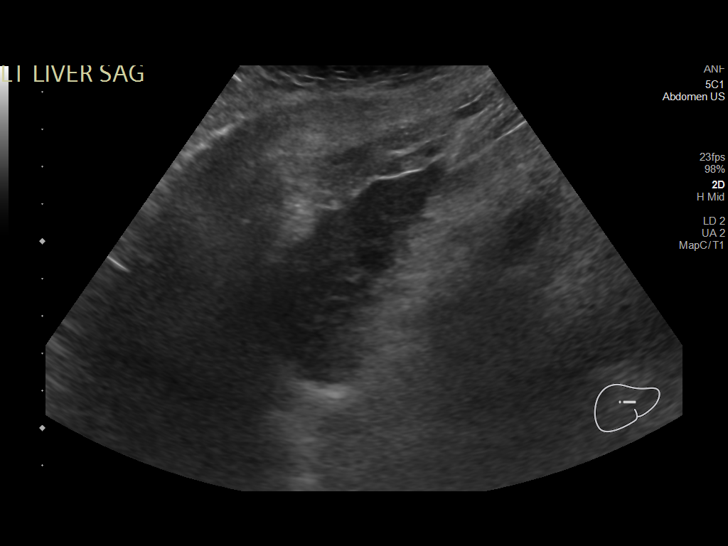
[im 21/32]
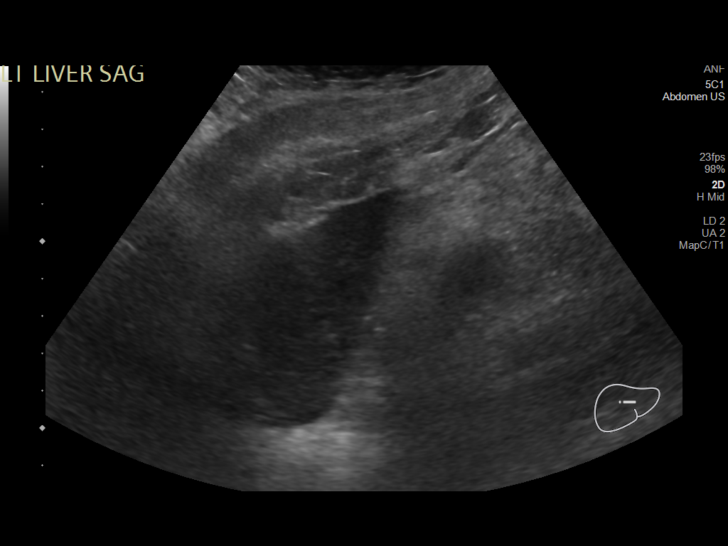
[im 24/32]
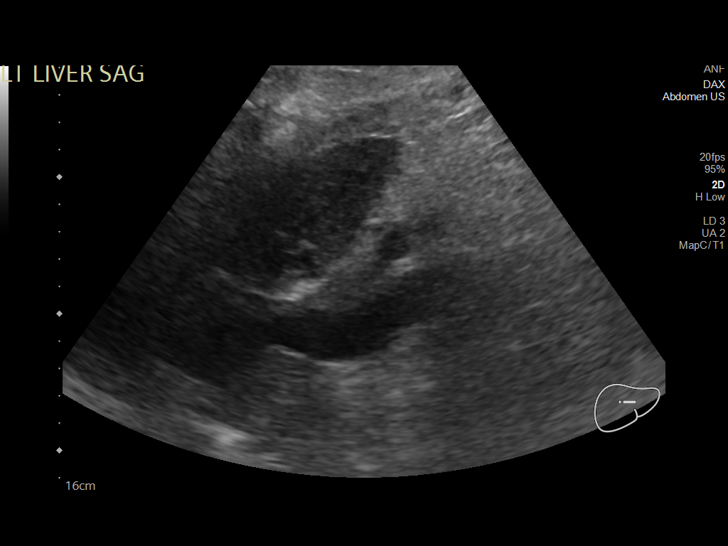
[im 26/32]
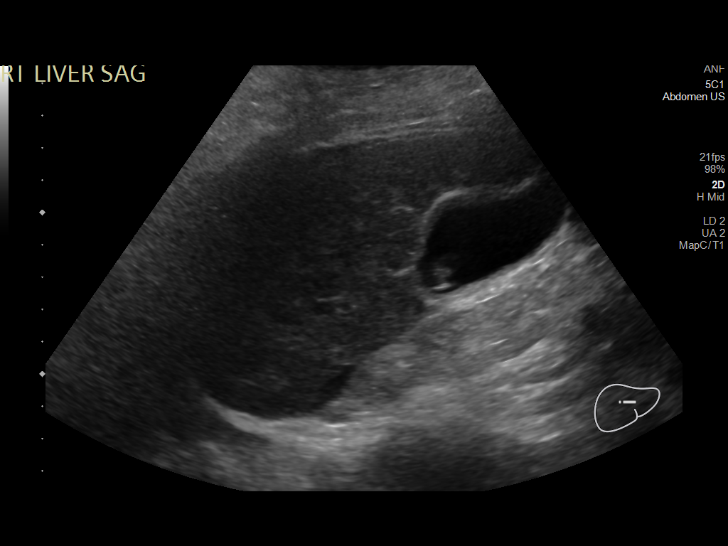
[im 29/32]
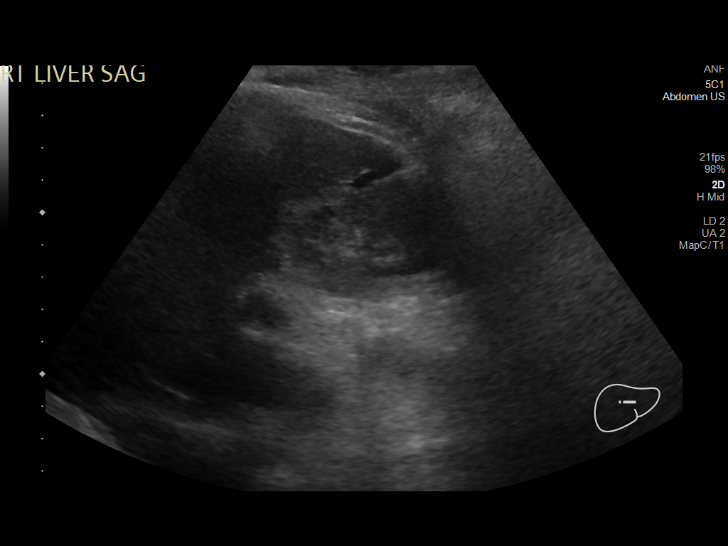
[im 32/32]
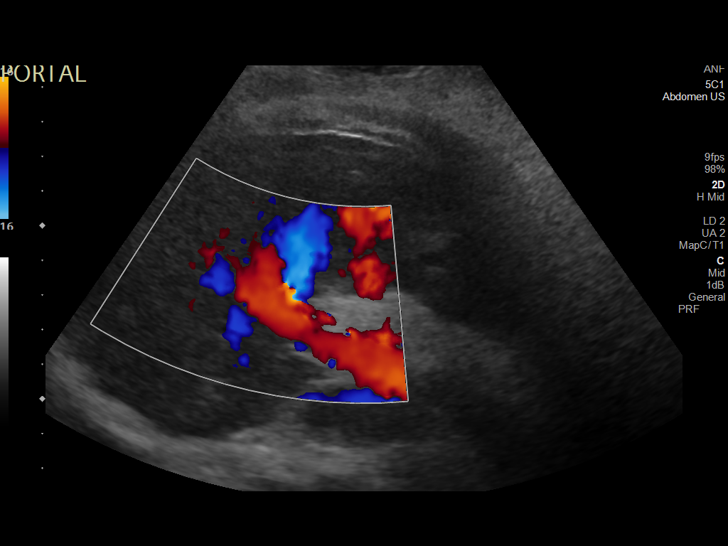

[14 of 25 positions shown; findings below may reference images not displayed]

FINDINGS: Gallbladder:

1.5 cm gallstone. No wall thickening visualized. No sonographic
Murphy sign noted by sonographer.

Common bile duct:

Diameter: 1 mm, normal.

Liver:

No focal lesion identified. Nodular contour with coarsened
parenchymal echogenicity. Portal vein is patent on color Doppler
imaging with normal direction of blood flow towards the liver.

Other: Small perihepatic ascites.
IMPRESSION: 1. Cirrhosis with small perihepatic ascites.
2. Cholelithiasis.

## 2022-08-24 IMAGING — CR DG HUMERUS 2V *L*
2 series · 2 of 2 positions shown · non-contrast
Comparison: None.

CLINICAL DATA: Pain

EXAM:
LEFT HUMERUS - 2+ VIEW

[humerus ap]
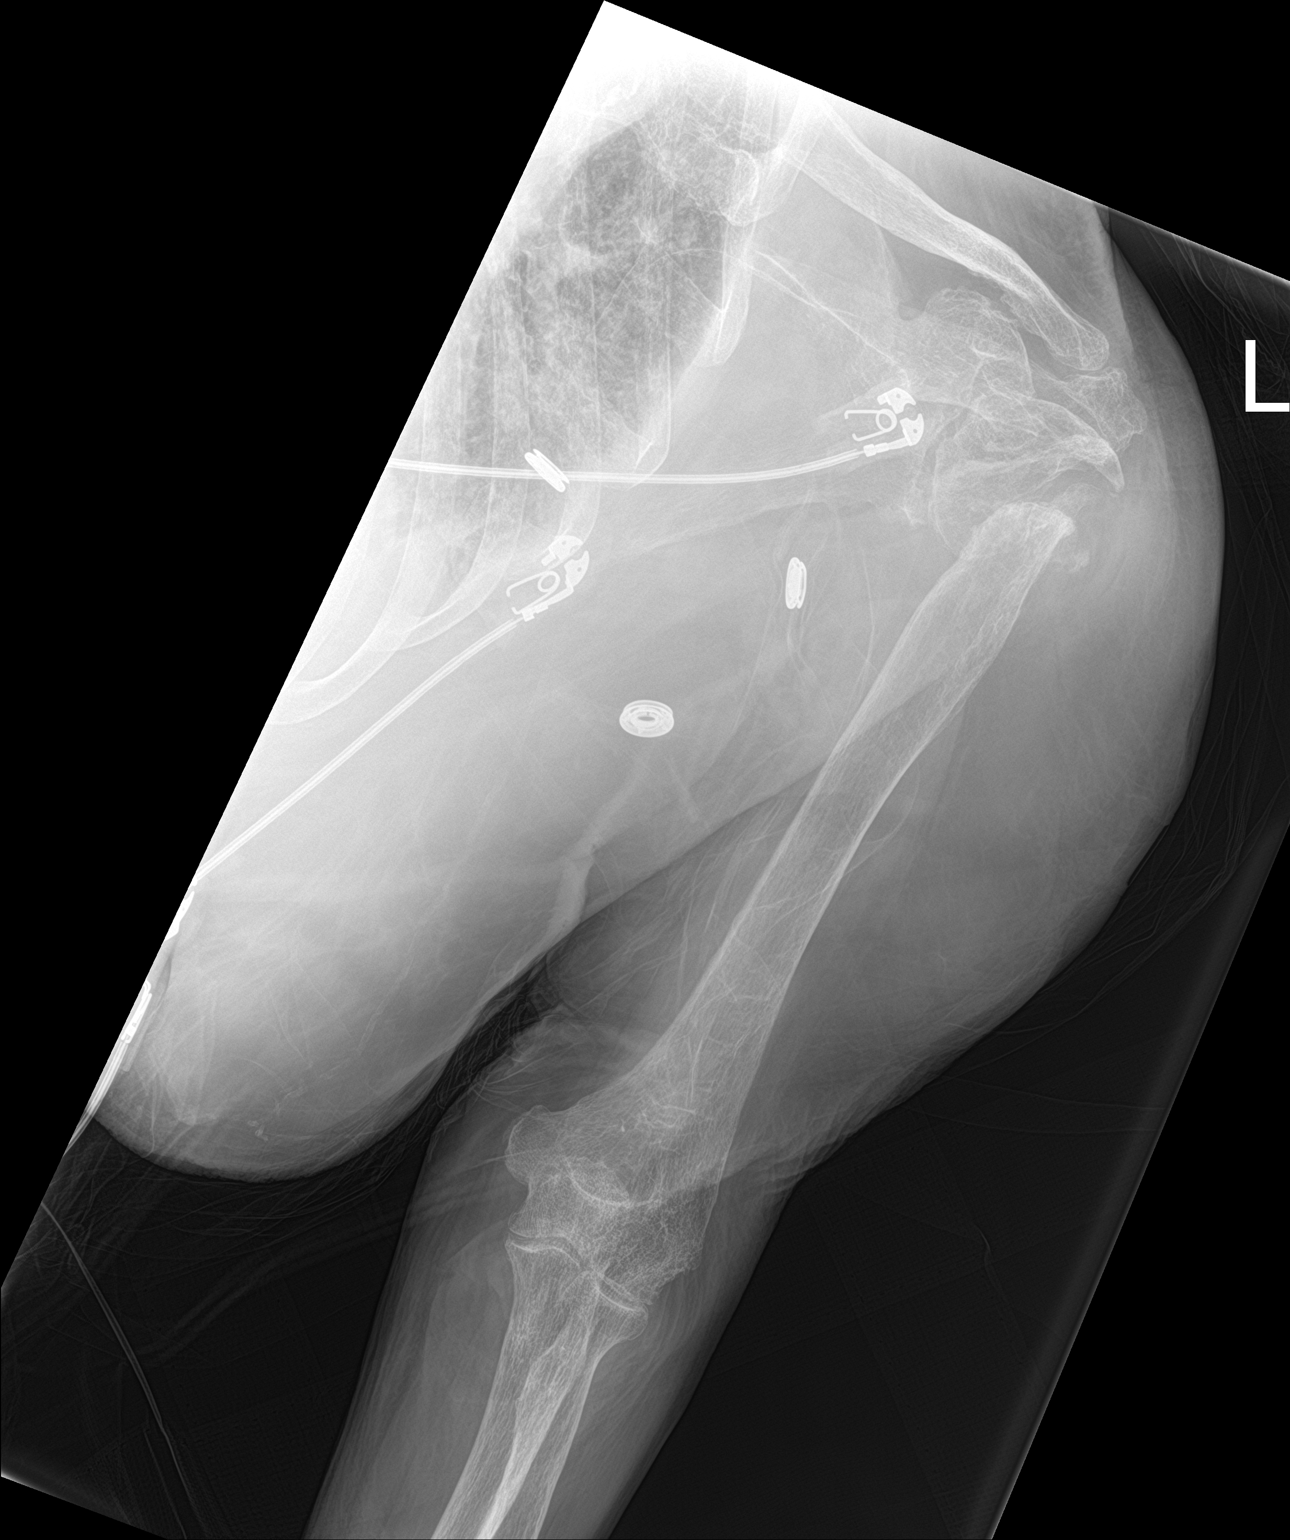

[humerus lat]
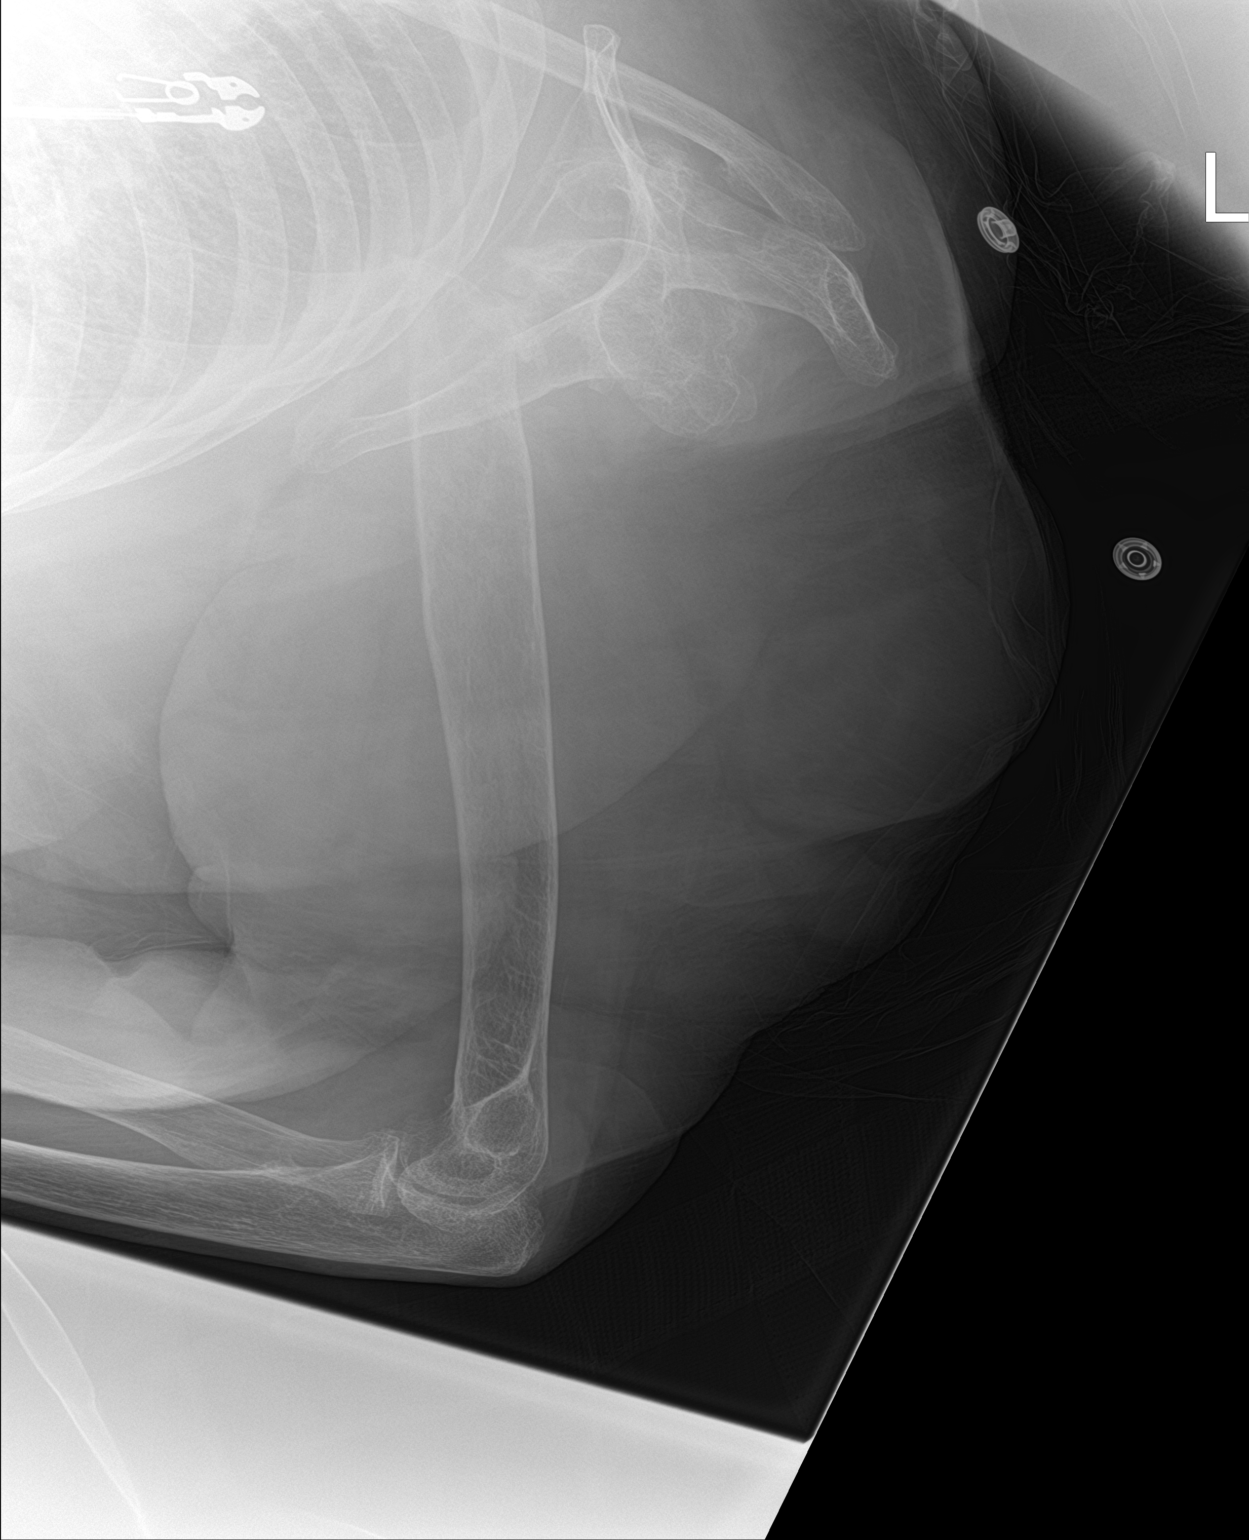

[2 of 2 positions shown; findings below may reference images not displayed]

FINDINGS: Frontal and lateral views obtained. There is a suspected chronic
comminuted fracture at the left proximal humeral metaphysis with
marked medial displacement and lateral angulation of the distal
major fracture fragment with respect proximal major fragment. No
dislocation. Bones osteoporotic. There is extensive arthropathy in
the left shoulder joint. Visualized left lung clear.

:
Suspect chronic fracture at the level of the proximal humeral
metaphysis on the left with displacement and angulation at the
fracture site. Bones are diffusely osteoporotic. No dislocation.
Extensive arthropathy left shoulder region.

## 2022-08-29 IMAGING — CR DG ABDOMEN 1V
1 series · 1 of 1 positions shown · non-contrast
Comparison: March 18, 2020.

CLINICAL DATA: Abdominal distension and pain.

EXAM:
ABDOMEN - 1 VIEW

[abdomen kub]
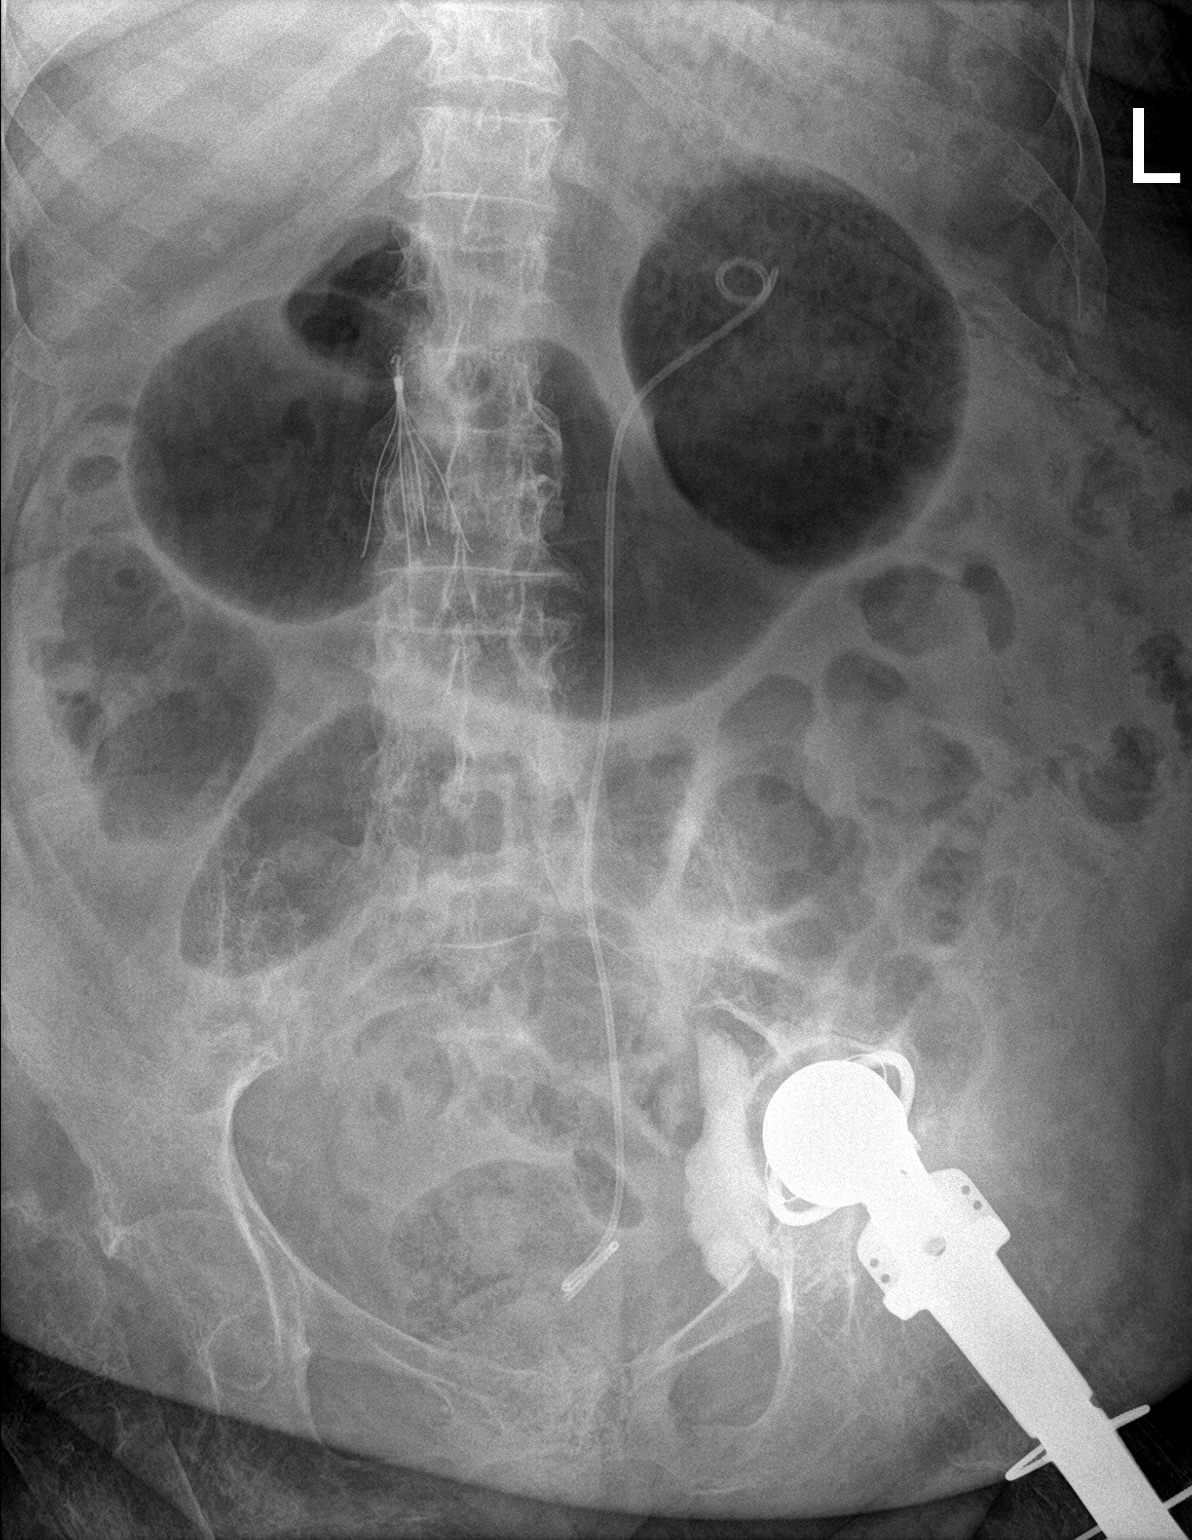

[1 of 1 positions shown; findings below may reference images not displayed]

FINDINGS: The bowel gas pattern is normal. Left ureteral stent is noted in
grossly good position. IVC filter is noted. No radio-opaque calculi
or other significant radiographic abnormality are seen.
IMPRESSION: No evidence of bowel obstruction or ileus. Left ureteral stent is
noted in grossly good position.
